# Patient Record
Sex: Female | Born: 1955 | Race: White | Hispanic: No | Smoking: Current every day smoker
Health system: Southern US, Community
[De-identification: ages and names within clinical notes are randomized; demographics above are authoritative.]

## PROBLEM LIST (undated history)

## (undated) DIAGNOSIS — F17200 Nicotine dependence, unspecified, uncomplicated: Secondary | ICD-10-CM

## (undated) DIAGNOSIS — G629 Polyneuropathy, unspecified: Secondary | ICD-10-CM

## (undated) DIAGNOSIS — J9612 Chronic respiratory failure with hypercapnia: Secondary | ICD-10-CM

## (undated) DIAGNOSIS — E785 Hyperlipidemia, unspecified: Secondary | ICD-10-CM

## (undated) DIAGNOSIS — Z9889 Other specified postprocedural states: Secondary | ICD-10-CM

## (undated) DIAGNOSIS — E119 Type 2 diabetes mellitus without complications: Secondary | ICD-10-CM

## (undated) DIAGNOSIS — A419 Sepsis, unspecified organism: Secondary | ICD-10-CM

## (undated) DIAGNOSIS — I2510 Atherosclerotic heart disease of native coronary artery without angina pectoris: Secondary | ICD-10-CM

## (undated) DIAGNOSIS — R079 Chest pain, unspecified: Secondary | ICD-10-CM

## (undated) DIAGNOSIS — I272 Pulmonary hypertension, unspecified: Secondary | ICD-10-CM

## (undated) DIAGNOSIS — R32 Unspecified urinary incontinence: Secondary | ICD-10-CM

## (undated) DIAGNOSIS — G4733 Obstructive sleep apnea (adult) (pediatric): Secondary | ICD-10-CM

## (undated) DIAGNOSIS — Z9981 Dependence on supplemental oxygen: Secondary | ICD-10-CM

## (undated) DIAGNOSIS — I739 Peripheral vascular disease, unspecified: Secondary | ICD-10-CM

## (undated) DIAGNOSIS — I509 Heart failure, unspecified: Secondary | ICD-10-CM

## (undated) DIAGNOSIS — J449 Chronic obstructive pulmonary disease, unspecified: Secondary | ICD-10-CM

## (undated) DIAGNOSIS — M4802 Spinal stenosis, cervical region: Secondary | ICD-10-CM

## (undated) DIAGNOSIS — I5030 Unspecified diastolic (congestive) heart failure: Secondary | ICD-10-CM

## (undated) DIAGNOSIS — F32A Depression, unspecified: Secondary | ICD-10-CM

## (undated) DIAGNOSIS — S81802A Unspecified open wound, left lower leg, initial encounter: Secondary | ICD-10-CM

## (undated) DIAGNOSIS — I700 Atherosclerosis of aorta: Secondary | ICD-10-CM

## (undated) DIAGNOSIS — F419 Anxiety disorder, unspecified: Secondary | ICD-10-CM

## (undated) DIAGNOSIS — R200 Anesthesia of skin: Secondary | ICD-10-CM

## (undated) DIAGNOSIS — G894 Chronic pain syndrome: Secondary | ICD-10-CM

## (undated) DIAGNOSIS — R000 Tachycardia, unspecified: Secondary | ICD-10-CM

## (undated) DIAGNOSIS — M1990 Unspecified osteoarthritis, unspecified site: Secondary | ICD-10-CM

## (undated) DIAGNOSIS — G4730 Sleep apnea, unspecified: Secondary | ICD-10-CM

## (undated) DIAGNOSIS — G959 Disease of spinal cord, unspecified: Secondary | ICD-10-CM

## (undated) DIAGNOSIS — R0600 Dyspnea, unspecified: Secondary | ICD-10-CM

## (undated) DIAGNOSIS — F329 Major depressive disorder, single episode, unspecified: Secondary | ICD-10-CM

## (undated) DIAGNOSIS — Z720 Tobacco use: Secondary | ICD-10-CM

## (undated) DIAGNOSIS — M961 Postlaminectomy syndrome, not elsewhere classified: Secondary | ICD-10-CM

## (undated) DIAGNOSIS — G2581 Restless legs syndrome: Secondary | ICD-10-CM

## (undated) DIAGNOSIS — R7303 Prediabetes: Secondary | ICD-10-CM

## (undated) DIAGNOSIS — I10 Essential (primary) hypertension: Secondary | ICD-10-CM

## (undated) DIAGNOSIS — E039 Hypothyroidism, unspecified: Secondary | ICD-10-CM

## (undated) DIAGNOSIS — J439 Emphysema, unspecified: Secondary | ICD-10-CM

## (undated) DIAGNOSIS — D649 Anemia, unspecified: Secondary | ICD-10-CM

## (undated) DIAGNOSIS — N1832 Chronic kidney disease, stage 3b: Secondary | ICD-10-CM

## (undated) DIAGNOSIS — M47816 Spondylosis without myelopathy or radiculopathy, lumbar region: Secondary | ICD-10-CM

## (undated) DIAGNOSIS — R569 Unspecified convulsions: Secondary | ICD-10-CM

## (undated) DIAGNOSIS — N390 Urinary tract infection, site not specified: Secondary | ICD-10-CM

## (undated) DIAGNOSIS — J9611 Chronic respiratory failure with hypoxia: Secondary | ICD-10-CM

## (undated) DIAGNOSIS — K219 Gastro-esophageal reflux disease without esophagitis: Secondary | ICD-10-CM

## (undated) DIAGNOSIS — M5134 Other intervertebral disc degeneration, thoracic region: Secondary | ICD-10-CM

## (undated) HISTORY — PX: LAMINECTOMY AND MICRODISCECTOMY LUMBAR SPINE: SHX1913

## (undated) HISTORY — DX: Gastro-esophageal reflux disease without esophagitis: K21.9

## (undated) HISTORY — DX: Unspecified convulsions: R56.9

## (undated) HISTORY — DX: Depression, unspecified: F32.A

## (undated) HISTORY — PX: KNEE SURGERY: SHX244

## (undated) HISTORY — PX: OTHER SURGICAL HISTORY: SHX169

## (undated) HISTORY — DX: Major depressive disorder, single episode, unspecified: F32.9

## (undated) HISTORY — DX: Chronic respiratory failure with hypoxia: J96.11

## (undated) HISTORY — PX: CARPAL TUNNEL RELEASE: SHX101

## (undated) HISTORY — DX: Restless legs syndrome: G25.81

## (undated) HISTORY — DX: Unspecified diastolic (congestive) heart failure: I50.30

## (undated) HISTORY — DX: Chronic obstructive pulmonary disease, unspecified: J44.9

## (undated) HISTORY — DX: Chronic respiratory failure with hypercapnia: J96.12

## (undated) HISTORY — DX: Anxiety disorder, unspecified: F41.9

## (undated) HISTORY — DX: Essential (primary) hypertension: I10

## (undated) HISTORY — DX: Hypothyroidism, unspecified: E03.9

---

## 1999-02-16 ENCOUNTER — Encounter: Payer: Self-pay | Admitting: Obstetrics and Gynecology

## 1999-02-16 ENCOUNTER — Ambulatory Visit (HOSPITAL_COMMUNITY): Admission: RE | Admit: 1999-02-16 | Discharge: 1999-02-16 | Payer: Self-pay | Admitting: Obstetrics and Gynecology

## 2000-04-26 ENCOUNTER — Ambulatory Visit (HOSPITAL_COMMUNITY): Admission: RE | Admit: 2000-04-26 | Discharge: 2000-04-26 | Payer: Self-pay | Admitting: Obstetrics and Gynecology

## 2000-04-26 ENCOUNTER — Encounter: Payer: Self-pay | Admitting: Obstetrics and Gynecology

## 2001-05-01 ENCOUNTER — Encounter: Payer: Self-pay | Admitting: Obstetrics and Gynecology

## 2001-05-01 ENCOUNTER — Ambulatory Visit (HOSPITAL_COMMUNITY): Admission: RE | Admit: 2001-05-01 | Discharge: 2001-05-01 | Payer: Self-pay | Admitting: Obstetrics and Gynecology

## 2002-12-17 ENCOUNTER — Encounter: Payer: Self-pay | Admitting: Obstetrics and Gynecology

## 2002-12-17 ENCOUNTER — Ambulatory Visit (HOSPITAL_COMMUNITY): Admission: RE | Admit: 2002-12-17 | Discharge: 2002-12-17 | Payer: Self-pay | Admitting: Obstetrics and Gynecology

## 2004-01-06 ENCOUNTER — Ambulatory Visit (HOSPITAL_COMMUNITY): Admission: RE | Admit: 2004-01-06 | Discharge: 2004-01-06 | Payer: Self-pay | Admitting: Obstetrics and Gynecology

## 2004-10-12 ENCOUNTER — Encounter: Admission: RE | Admit: 2004-10-12 | Discharge: 2004-10-12 | Payer: Self-pay | Admitting: Family Medicine

## 2004-12-16 ENCOUNTER — Ambulatory Visit (HOSPITAL_COMMUNITY): Admission: RE | Admit: 2004-12-16 | Discharge: 2004-12-16 | Payer: Self-pay | Admitting: Orthopedic Surgery

## 2004-12-16 ENCOUNTER — Ambulatory Visit (HOSPITAL_BASED_OUTPATIENT_CLINIC_OR_DEPARTMENT_OTHER): Admission: RE | Admit: 2004-12-16 | Discharge: 2004-12-16 | Payer: Self-pay | Admitting: Orthopedic Surgery

## 2005-02-17 ENCOUNTER — Ambulatory Visit (HOSPITAL_COMMUNITY): Admission: RE | Admit: 2005-02-17 | Discharge: 2005-02-17 | Payer: Self-pay | Admitting: Family Medicine

## 2005-02-26 ENCOUNTER — Encounter: Admission: RE | Admit: 2005-02-26 | Discharge: 2005-02-26 | Payer: Self-pay | Admitting: Family Medicine

## 2005-05-19 ENCOUNTER — Encounter: Admission: RE | Admit: 2005-05-19 | Discharge: 2005-05-19 | Payer: Self-pay | Admitting: Family Medicine

## 2005-09-20 ENCOUNTER — Encounter: Admission: RE | Admit: 2005-09-20 | Discharge: 2005-09-20 | Payer: Self-pay | Admitting: Family Medicine

## 2005-11-24 ENCOUNTER — Encounter: Admission: RE | Admit: 2005-11-24 | Discharge: 2005-11-24 | Payer: Self-pay | Admitting: Family Medicine

## 2006-03-07 ENCOUNTER — Encounter: Admission: RE | Admit: 2006-03-07 | Discharge: 2006-03-07 | Payer: Self-pay | Admitting: Family Medicine

## 2006-04-04 ENCOUNTER — Ambulatory Visit (HOSPITAL_COMMUNITY): Admission: RE | Admit: 2006-04-04 | Discharge: 2006-04-04 | Payer: Self-pay | Admitting: Orthopedic Surgery

## 2006-04-14 ENCOUNTER — Ambulatory Visit (HOSPITAL_BASED_OUTPATIENT_CLINIC_OR_DEPARTMENT_OTHER): Admission: RE | Admit: 2006-04-14 | Discharge: 2006-04-14 | Payer: Self-pay | Admitting: Orthopedic Surgery

## 2006-07-11 ENCOUNTER — Encounter: Admission: RE | Admit: 2006-07-11 | Discharge: 2006-07-11 | Payer: Self-pay | Admitting: Family Medicine

## 2007-02-28 ENCOUNTER — Encounter: Admission: RE | Admit: 2007-02-28 | Discharge: 2007-04-06 | Payer: Self-pay | Admitting: Neurosurgery

## 2007-03-10 ENCOUNTER — Ambulatory Visit (HOSPITAL_COMMUNITY): Admission: RE | Admit: 2007-03-10 | Discharge: 2007-03-10 | Payer: Self-pay | Admitting: Obstetrics and Gynecology

## 2007-03-21 ENCOUNTER — Encounter: Admission: RE | Admit: 2007-03-21 | Discharge: 2007-03-21 | Payer: Self-pay | Admitting: Obstetrics and Gynecology

## 2007-05-11 ENCOUNTER — Encounter
Admission: RE | Admit: 2007-05-11 | Discharge: 2007-08-09 | Payer: Self-pay | Admitting: Physical Medicine & Rehabilitation

## 2007-05-12 ENCOUNTER — Ambulatory Visit: Payer: Self-pay | Admitting: Physical Medicine & Rehabilitation

## 2007-07-24 ENCOUNTER — Ambulatory Visit: Payer: Self-pay | Admitting: Physical Medicine & Rehabilitation

## 2007-08-23 ENCOUNTER — Encounter
Admission: RE | Admit: 2007-08-23 | Discharge: 2007-11-21 | Payer: Self-pay | Admitting: Physical Medicine & Rehabilitation

## 2007-09-07 ENCOUNTER — Ambulatory Visit: Payer: Self-pay | Admitting: Physical Medicine & Rehabilitation

## 2007-10-09 ENCOUNTER — Ambulatory Visit: Payer: Self-pay | Admitting: Physical Medicine & Rehabilitation

## 2007-11-20 ENCOUNTER — Encounter
Admission: RE | Admit: 2007-11-20 | Discharge: 2007-11-20 | Payer: Self-pay | Admitting: Physical Medicine & Rehabilitation

## 2007-12-22 ENCOUNTER — Encounter
Admission: RE | Admit: 2007-12-22 | Discharge: 2008-03-21 | Payer: Self-pay | Admitting: Physical Medicine & Rehabilitation

## 2007-12-22 ENCOUNTER — Ambulatory Visit: Payer: Self-pay | Admitting: Physical Medicine & Rehabilitation

## 2008-02-07 ENCOUNTER — Ambulatory Visit: Payer: Self-pay | Admitting: Physical Medicine & Rehabilitation

## 2008-02-26 ENCOUNTER — Encounter
Admission: RE | Admit: 2008-02-26 | Discharge: 2008-05-26 | Payer: Self-pay | Admitting: Physical Medicine & Rehabilitation

## 2008-04-09 ENCOUNTER — Ambulatory Visit: Payer: Self-pay | Admitting: Physical Medicine & Rehabilitation

## 2008-04-30 ENCOUNTER — Encounter
Admission: RE | Admit: 2008-04-30 | Discharge: 2008-05-14 | Payer: Self-pay | Admitting: Physical Medicine & Rehabilitation

## 2008-05-07 ENCOUNTER — Ambulatory Visit: Payer: Self-pay | Admitting: Physical Medicine & Rehabilitation

## 2008-05-29 ENCOUNTER — Encounter
Admission: RE | Admit: 2008-05-29 | Discharge: 2008-08-27 | Payer: Self-pay | Admitting: Physical Medicine & Rehabilitation

## 2008-06-03 ENCOUNTER — Encounter: Admission: RE | Admit: 2008-06-03 | Discharge: 2008-06-03 | Payer: Self-pay | Admitting: Family Medicine

## 2008-06-10 ENCOUNTER — Ambulatory Visit: Payer: Self-pay | Admitting: Physical Medicine & Rehabilitation

## 2008-06-20 ENCOUNTER — Emergency Department (HOSPITAL_COMMUNITY): Admission: EM | Admit: 2008-06-20 | Discharge: 2008-06-21 | Payer: Self-pay | Admitting: *Deleted

## 2008-07-09 ENCOUNTER — Ambulatory Visit: Payer: Self-pay | Admitting: Physical Medicine & Rehabilitation

## 2008-08-07 ENCOUNTER — Ambulatory Visit: Payer: Self-pay | Admitting: Physical Medicine & Rehabilitation

## 2008-09-02 ENCOUNTER — Encounter
Admission: RE | Admit: 2008-09-02 | Discharge: 2008-12-01 | Payer: Self-pay | Admitting: Physical Medicine & Rehabilitation

## 2008-09-04 ENCOUNTER — Ambulatory Visit: Payer: Self-pay | Admitting: Physical Medicine & Rehabilitation

## 2008-10-03 ENCOUNTER — Ambulatory Visit: Payer: Self-pay | Admitting: Physical Medicine & Rehabilitation

## 2008-11-01 ENCOUNTER — Ambulatory Visit: Payer: Self-pay | Admitting: Physical Medicine & Rehabilitation

## 2008-11-28 ENCOUNTER — Ambulatory Visit: Payer: Self-pay | Admitting: Physical Medicine & Rehabilitation

## 2008-12-16 ENCOUNTER — Ambulatory Visit (HOSPITAL_COMMUNITY)
Admission: RE | Admit: 2008-12-16 | Discharge: 2008-12-16 | Payer: Self-pay | Admitting: Physical Medicine & Rehabilitation

## 2008-12-26 ENCOUNTER — Encounter
Admission: RE | Admit: 2008-12-26 | Discharge: 2009-01-28 | Payer: Self-pay | Admitting: Physical Medicine & Rehabilitation

## 2008-12-31 ENCOUNTER — Ambulatory Visit: Payer: Self-pay | Admitting: Physical Medicine & Rehabilitation

## 2009-01-28 ENCOUNTER — Encounter
Admission: RE | Admit: 2009-01-28 | Discharge: 2009-04-28 | Payer: Self-pay | Admitting: Physical Medicine & Rehabilitation

## 2009-01-30 ENCOUNTER — Ambulatory Visit: Payer: Self-pay | Admitting: Physical Medicine & Rehabilitation

## 2009-02-28 ENCOUNTER — Ambulatory Visit: Payer: Self-pay | Admitting: Physical Medicine & Rehabilitation

## 2009-03-11 ENCOUNTER — Encounter: Admission: RE | Admit: 2009-03-11 | Discharge: 2009-03-11 | Payer: Self-pay | Admitting: Obstetrics and Gynecology

## 2009-03-16 ENCOUNTER — Emergency Department (HOSPITAL_COMMUNITY): Admission: EM | Admit: 2009-03-16 | Discharge: 2009-03-16 | Payer: Self-pay | Admitting: Emergency Medicine

## 2009-03-20 ENCOUNTER — Ambulatory Visit (HOSPITAL_COMMUNITY): Admission: RE | Admit: 2009-03-20 | Discharge: 2009-03-20 | Payer: Self-pay | Admitting: Neurosurgery

## 2009-03-24 ENCOUNTER — Encounter: Admission: RE | Admit: 2009-03-24 | Discharge: 2009-03-24 | Payer: Self-pay | Admitting: Obstetrics and Gynecology

## 2009-04-01 ENCOUNTER — Encounter
Admission: RE | Admit: 2009-04-01 | Discharge: 2009-04-11 | Payer: Self-pay | Admitting: Physical Medicine & Rehabilitation

## 2009-04-03 ENCOUNTER — Ambulatory Visit: Payer: Self-pay | Admitting: Physical Medicine & Rehabilitation

## 2009-04-18 ENCOUNTER — Inpatient Hospital Stay (HOSPITAL_COMMUNITY): Admission: RE | Admit: 2009-04-18 | Discharge: 2009-04-23 | Payer: Self-pay | Admitting: Neurosurgery

## 2009-06-09 ENCOUNTER — Encounter: Admission: RE | Admit: 2009-06-09 | Discharge: 2009-07-17 | Payer: Self-pay | Admitting: Neurosurgery

## 2009-08-15 ENCOUNTER — Encounter
Admission: RE | Admit: 2009-08-15 | Discharge: 2009-11-13 | Payer: Self-pay | Admitting: Physical Medicine & Rehabilitation

## 2009-08-19 ENCOUNTER — Ambulatory Visit: Payer: Self-pay | Admitting: Physical Medicine & Rehabilitation

## 2009-08-20 ENCOUNTER — Emergency Department (HOSPITAL_COMMUNITY): Admission: EM | Admit: 2009-08-20 | Discharge: 2009-08-20 | Payer: Self-pay | Admitting: Emergency Medicine

## 2009-09-17 ENCOUNTER — Ambulatory Visit: Payer: Self-pay | Admitting: Physical Medicine & Rehabilitation

## 2009-10-16 ENCOUNTER — Ambulatory Visit: Payer: Self-pay | Admitting: Physical Medicine & Rehabilitation

## 2009-10-20 ENCOUNTER — Encounter: Admission: RE | Admit: 2009-10-20 | Discharge: 2009-11-26 | Payer: Self-pay | Admitting: Neurosurgery

## 2009-11-13 ENCOUNTER — Encounter
Admission: RE | Admit: 2009-11-13 | Discharge: 2009-12-11 | Payer: Self-pay | Admitting: Physical Medicine & Rehabilitation

## 2009-11-14 ENCOUNTER — Ambulatory Visit: Payer: Self-pay | Admitting: Physical Medicine & Rehabilitation

## 2009-12-15 ENCOUNTER — Encounter
Admission: RE | Admit: 2009-12-15 | Discharge: 2010-03-15 | Payer: Self-pay | Admitting: Physical Medicine & Rehabilitation

## 2009-12-25 ENCOUNTER — Ambulatory Visit: Payer: Self-pay | Admitting: Physical Medicine & Rehabilitation

## 2010-01-21 ENCOUNTER — Ambulatory Visit: Payer: Self-pay | Admitting: Physical Medicine & Rehabilitation

## 2010-02-17 ENCOUNTER — Ambulatory Visit: Payer: Self-pay | Admitting: Physical Medicine & Rehabilitation

## 2010-03-12 ENCOUNTER — Inpatient Hospital Stay (HOSPITAL_COMMUNITY): Admission: EM | Admit: 2010-03-12 | Discharge: 2010-03-14 | Payer: Self-pay | Admitting: Emergency Medicine

## 2010-03-12 ENCOUNTER — Ambulatory Visit: Payer: Self-pay | Admitting: Cardiovascular Disease

## 2010-03-13 ENCOUNTER — Encounter (INDEPENDENT_AMBULATORY_CARE_PROVIDER_SITE_OTHER): Payer: Self-pay | Admitting: Internal Medicine

## 2010-03-16 ENCOUNTER — Encounter
Admission: RE | Admit: 2010-03-16 | Discharge: 2010-06-14 | Payer: Self-pay | Admitting: Physical Medicine & Rehabilitation

## 2010-03-18 ENCOUNTER — Ambulatory Visit: Payer: Self-pay | Admitting: Physical Medicine & Rehabilitation

## 2010-04-14 ENCOUNTER — Inpatient Hospital Stay (HOSPITAL_COMMUNITY): Admission: EM | Admit: 2010-04-14 | Discharge: 2010-04-17 | Payer: Self-pay | Admitting: Emergency Medicine

## 2010-04-22 ENCOUNTER — Ambulatory Visit: Payer: Self-pay | Admitting: Physical Medicine & Rehabilitation

## 2010-05-20 ENCOUNTER — Encounter
Admission: RE | Admit: 2010-05-20 | Discharge: 2010-08-18 | Payer: Self-pay | Admitting: Physical Medicine & Rehabilitation

## 2010-05-26 ENCOUNTER — Ambulatory Visit: Payer: Self-pay | Admitting: Physical Medicine & Rehabilitation

## 2010-07-01 ENCOUNTER — Ambulatory Visit: Payer: Self-pay | Admitting: Physical Medicine & Rehabilitation

## 2010-07-29 ENCOUNTER — Ambulatory Visit: Payer: Self-pay | Admitting: Physical Medicine & Rehabilitation

## 2010-08-20 ENCOUNTER — Encounter
Admission: RE | Admit: 2010-08-20 | Discharge: 2010-11-18 | Payer: Self-pay | Source: Home / Self Care | Admitting: Physical Medicine & Rehabilitation

## 2010-09-01 ENCOUNTER — Ambulatory Visit: Payer: Self-pay | Admitting: Physical Medicine & Rehabilitation

## 2010-10-15 ENCOUNTER — Ambulatory Visit: Payer: Self-pay | Admitting: Physical Medicine & Rehabilitation

## 2010-11-17 ENCOUNTER — Ambulatory Visit: Payer: Self-pay | Admitting: Physical Medicine & Rehabilitation

## 2010-12-14 ENCOUNTER — Encounter
Admission: RE | Admit: 2010-12-14 | Discharge: 2011-01-04 | Payer: Self-pay | Source: Home / Self Care | Attending: Physical Medicine & Rehabilitation | Admitting: Physical Medicine & Rehabilitation

## 2010-12-16 ENCOUNTER — Ambulatory Visit: Admit: 2010-12-16 | Payer: Self-pay | Admitting: Physical Medicine & Rehabilitation

## 2010-12-17 ENCOUNTER — Ambulatory Visit
Admission: RE | Admit: 2010-12-17 | Discharge: 2010-12-17 | Payer: Self-pay | Source: Home / Self Care | Attending: Physical Medicine & Rehabilitation | Admitting: Physical Medicine & Rehabilitation

## 2011-01-02 ENCOUNTER — Encounter: Payer: Self-pay | Admitting: Family Medicine

## 2011-01-03 ENCOUNTER — Encounter: Payer: Self-pay | Admitting: Neurosurgery

## 2011-01-04 ENCOUNTER — Ambulatory Visit
Admission: RE | Admit: 2011-01-04 | Discharge: 2011-01-04 | Payer: Self-pay | Source: Home / Self Care | Attending: Physical Medicine & Rehabilitation | Admitting: Physical Medicine & Rehabilitation

## 2011-02-01 ENCOUNTER — Encounter: Payer: Medicare Other | Admitting: Physical Medicine & Rehabilitation

## 2011-02-01 ENCOUNTER — Encounter: Payer: Medicare Other | Attending: Physical Medicine & Rehabilitation

## 2011-02-01 DIAGNOSIS — M1710 Unilateral primary osteoarthritis, unspecified knee: Secondary | ICD-10-CM

## 2011-02-01 DIAGNOSIS — M25569 Pain in unspecified knee: Secondary | ICD-10-CM | POA: Insufficient documentation

## 2011-02-24 ENCOUNTER — Encounter: Payer: Medicare Other | Attending: Physical Medicine & Rehabilitation

## 2011-02-24 ENCOUNTER — Ambulatory Visit: Payer: Medicare Other

## 2011-02-24 DIAGNOSIS — M25569 Pain in unspecified knee: Secondary | ICD-10-CM | POA: Insufficient documentation

## 2011-02-24 DIAGNOSIS — M1710 Unilateral primary osteoarthritis, unspecified knee: Secondary | ICD-10-CM | POA: Insufficient documentation

## 2011-02-24 DIAGNOSIS — M48061 Spinal stenosis, lumbar region without neurogenic claudication: Secondary | ICD-10-CM

## 2011-02-24 DIAGNOSIS — M549 Dorsalgia, unspecified: Secondary | ICD-10-CM | POA: Insufficient documentation

## 2011-02-24 DIAGNOSIS — M961 Postlaminectomy syndrome, not elsewhere classified: Secondary | ICD-10-CM | POA: Insufficient documentation

## 2011-02-24 DIAGNOSIS — IMO0002 Reserved for concepts with insufficient information to code with codable children: Secondary | ICD-10-CM

## 2011-03-02 LAB — CARDIAC PANEL(CRET KIN+CKTOT+MB+TROPI)
CK, MB: 2.2 ng/mL (ref 0.3–4.0)
CK, MB: 2.5 ng/mL (ref 0.3–4.0)
Total CK: 40 U/L (ref 7–177)
Total CK: 43 U/L (ref 7–177)
Total CK: 83 U/L (ref 7–177)
Troponin I: <0.01 ng/mL (ref 0.00–0.06)

## 2011-03-02 LAB — COMPREHENSIVE METABOLIC PANEL
ALT: 16 U/L (ref 0–35)
AST: 20 U/L (ref 0–37)
Albumin: 1.9 g/dL — ABNORMAL LOW (ref 3.5–5.2)
Alkaline Phosphatase: 18 U/L — ABNORMAL LOW (ref 39–117)
BUN: 14 mg/dL (ref 6–23)
BUN: 15 mg/dL (ref 6–23)
BUN: 6 mg/dL (ref 6–23)
CO2: 29 mEq/L (ref 19–32)
Calcium: 8 mg/dL — ABNORMAL LOW (ref 8.4–10.5)
Calcium: 9 mg/dL (ref 8.4–10.5)
Chloride: 105 mEq/L (ref 96–112)
Creatinine, Ser: 1.08 mg/dL (ref 0.4–1.2)
GFR calc Af Amer: >60 mL/min (ref >60)
GFR calc non Af Amer: 53 mL/min — ABNORMAL LOW (ref >60)
Glucose, Bld: 92 mg/dL (ref 70–99)
Glucose, Bld: 93 mg/dL (ref 70–99)
Potassium: 4.1 mEq/L (ref 3.5–5.1)
Sodium: 136 mEq/L (ref 135–145)
Total Bilirubin: 0.4 mg/dL (ref 0.3–1.2)
Total Protein: 5.8 g/dL — ABNORMAL LOW (ref 6.0–8.3)
Total Protein: 5.8 g/dL — ABNORMAL LOW (ref 6.0–8.3)

## 2011-03-02 LAB — CBC
HCT: 31.8 % — ABNORMAL LOW (ref 36.0–46.0)
HCT: 32.4 % — ABNORMAL LOW (ref 36.0–46.0)
HCT: 34.8 % — ABNORMAL LOW (ref 36.0–46.0)
HCT: 41.4 % (ref 36.0–46.0)
Hemoglobin: 10.8 g/dL — ABNORMAL LOW (ref 12.0–15.0)
Hemoglobin: 11 g/dL — ABNORMAL LOW (ref 12.0–15.0)
Hemoglobin: 11.6 g/dL — ABNORMAL LOW (ref 12.0–15.0)
MCHC: 32.7 g/dL (ref 30.0–36.0)
MCHC: 33.2 g/dL (ref 30.0–36.0)
MCHC: 33.4 g/dL (ref 30.0–36.0)
MCV: 90.7 fL (ref 78.0–100.0)
MCV: 90.8 fL (ref 78.0–100.0)
MCV: 91.4 fL (ref 78.0–100.0)
MCV: 92.1 fL (ref 78.0–100.0)
Platelets: 319 10*3/uL (ref 150–400)
RBC: 3.51 MIL/uL — ABNORMAL LOW (ref 3.87–5.11)
RBC: 4.49 MIL/uL (ref 3.87–5.11)
RDW: 15.3 % (ref 11.5–15.5)
RDW: 15.5 % (ref 11.5–15.5)
RDW: 15.8 % — ABNORMAL HIGH (ref 11.5–15.5)
WBC: 10.9 10*3/uL — ABNORMAL HIGH (ref 4.0–10.5)
WBC: 11.9 10*3/uL — ABNORMAL HIGH (ref 4.0–10.5)

## 2011-03-02 LAB — BASIC METABOLIC PANEL
BUN: 4 mg/dL — ABNORMAL LOW (ref 6–23)
CO2: 24 mEq/L (ref 19–32)
CO2: 26 mEq/L (ref 19–32)
Calcium: 8.3 mg/dL — ABNORMAL LOW (ref 8.4–10.5)
Chloride: 107 mEq/L (ref 96–112)
Chloride: 108 mEq/L (ref 96–112)
Creatinine, Ser: 0.78 mg/dL (ref 0.4–1.2)
GFR calc Af Amer: >60 mL/min (ref >60)
Glucose, Bld: 126 mg/dL — ABNORMAL HIGH (ref 70–99)
Potassium: 3.3 mEq/L — ABNORMAL LOW (ref 3.5–5.1)
Sodium: 138 mEq/L (ref 135–145)

## 2011-03-02 LAB — CLOSTRIDIUM DIFFICILE EIA

## 2011-03-02 LAB — URINALYSIS, ROUTINE W REFLEX MICROSCOPIC
Glucose, UA: NEGATIVE mg/dL
Nitrite: NEGATIVE
Specific Gravity, Urine: 1.016 (ref 1.005–1.030)
pH: 5 (ref 5.0–8.0)

## 2011-03-02 LAB — GLUCOSE, CAPILLARY

## 2011-03-02 LAB — DIFFERENTIAL
Basophils Absolute: 0.1 10*3/uL (ref 0.0–0.1)
Eosinophils Relative: 1 % (ref 0–5)
Lymphocytes Relative: 26 % (ref 12–46)
Lymphs Abs: 3.7 10*3/uL (ref 0.7–4.0)
Neutro Abs: 8.4 10*3/uL — ABNORMAL HIGH (ref 1.7–7.7)

## 2011-03-02 LAB — POCT CARDIAC MARKERS
CKMB, poc: 3.9 ng/mL (ref 1.0–8.0)
Myoglobin, poc: 150 ng/mL (ref 12–200)
Troponin i, poc: <0.05 ng/mL (ref 0.00–0.09)

## 2011-03-02 LAB — RAPID URINE DRUG SCREEN, HOSP PERFORMED
Cocaine: NOT DETECTED
Tetrahydrocannabinol: NOT DETECTED

## 2011-03-02 LAB — CULTURE, BLOOD (ROUTINE X 2)

## 2011-03-02 LAB — URINE CULTURE
Colony Count: NO GROWTH
Culture: NO GROWTH

## 2011-03-02 LAB — STOOL CULTURE

## 2011-03-02 LAB — LIPASE, BLOOD: Lipase: 175 U/L — ABNORMAL HIGH (ref 11–59)

## 2011-03-03 LAB — GLUCOSE, CAPILLARY: Glucose-Capillary: 162 mg/dL — ABNORMAL HIGH (ref 70–99)

## 2011-03-03 LAB — CARDIAC PANEL(CRET KIN+CKTOT+MB+TROPI)
CK, MB: 27 ng/mL (ref 0.3–4.0)
Relative Index: 1 (ref 0.0–2.5)
Relative Index: 1.7 (ref 0.0–2.5)
Relative Index: 2.4 (ref 0.0–2.5)
Total CK: 941 U/L — ABNORMAL HIGH (ref 7–177)
Total CK: 998 U/L — ABNORMAL HIGH (ref 7–177)
Troponin I: 0.01 ng/mL (ref 0.00–0.06)
Troponin I: <0.01 ng/mL (ref 0.00–0.06)

## 2011-03-03 LAB — BASIC METABOLIC PANEL
CO2: 28 mEq/L (ref 19–32)
Chloride: 103 mEq/L (ref 96–112)
Creatinine, Ser: 0.99 mg/dL (ref 0.4–1.2)
GFR calc Af Amer: >60 mL/min (ref >60)
Glucose, Bld: 90 mg/dL (ref 70–99)
Sodium: 137 mEq/L (ref 135–145)

## 2011-03-03 LAB — COMPREHENSIVE METABOLIC PANEL
ALT: 29 U/L (ref 0–35)
AST: 57 U/L — ABNORMAL HIGH (ref 0–37)
Alkaline Phosphatase: 19 U/L — ABNORMAL LOW (ref 39–117)
CO2: 28 mEq/L (ref 19–32)
Chloride: 102 mEq/L (ref 96–112)
GFR calc Af Amer: 47 mL/min — ABNORMAL LOW (ref >60)
GFR calc non Af Amer: 39 mL/min — ABNORMAL LOW (ref >60)
Glucose, Bld: 117 mg/dL — ABNORMAL HIGH (ref 70–99)
Potassium: 3.7 mEq/L (ref 3.5–5.1)
Sodium: 136 mEq/L (ref 135–145)
Total Bilirubin: 0.4 mg/dL (ref 0.3–1.2)

## 2011-03-03 LAB — HEMOGLOBIN A1C: Hgb A1c MFr Bld: 5.8 % (ref 4.6–6.1)

## 2011-03-03 LAB — CBC
Hemoglobin: 12.3 g/dL (ref 12.0–15.0)
Hemoglobin: 12.3 g/dL (ref 12.0–15.0)
MCHC: 34.4 g/dL (ref 30.0–36.0)
MCHC: 34.8 g/dL (ref 30.0–36.0)
MCV: 94.2 fL (ref 78.0–100.0)
RBC: 3.75 MIL/uL — ABNORMAL LOW (ref 3.87–5.11)
RBC: 3.8 MIL/uL — ABNORMAL LOW (ref 3.87–5.11)
WBC: 7.8 10*3/uL (ref 4.0–10.5)

## 2011-03-03 LAB — LIPID PANEL
HDL: 24 mg/dL — ABNORMAL LOW (ref >39)
Total CHOL/HDL Ratio: 4.1 RATIO
VLDL: 13 mg/dL (ref 0–40)

## 2011-03-03 LAB — MRSA PCR SCREENING: MRSA by PCR: NEGATIVE

## 2011-03-07 LAB — URINE MICROSCOPIC-ADD ON

## 2011-03-07 LAB — COMPREHENSIVE METABOLIC PANEL
BUN: 34 mg/dL — ABNORMAL HIGH (ref 6–23)
CO2: 30 mEq/L (ref 19–32)
Calcium: 8.4 mg/dL (ref 8.4–10.5)
Creatinine, Ser: 1.58 mg/dL — ABNORMAL HIGH (ref 0.4–1.2)
GFR calc Af Amer: 42 mL/min — ABNORMAL LOW (ref >60)
GFR calc non Af Amer: 34 mL/min — ABNORMAL LOW (ref >60)
Glucose, Bld: 108 mg/dL — ABNORMAL HIGH (ref 70–99)

## 2011-03-07 LAB — RAPID URINE DRUG SCREEN, HOSP PERFORMED
Amphetamines: NOT DETECTED
Benzodiazepines: POSITIVE — AB
Tetrahydrocannabinol: NOT DETECTED

## 2011-03-07 LAB — POCT I-STAT, CHEM 8
BUN: 47 mg/dL — ABNORMAL HIGH (ref 6–23)
Chloride: 99 mEq/L (ref 96–112)
Sodium: 134 mEq/L — ABNORMAL LOW (ref 135–145)
TCO2: 32 mmol/L (ref 0–100)

## 2011-03-07 LAB — DIFFERENTIAL
Basophils Absolute: 0.1 10*3/uL (ref 0.0–0.1)
Lymphocytes Relative: 26 % (ref 12–46)
Neutro Abs: 5.4 10*3/uL (ref 1.7–7.7)
Neutrophils Relative %: 64 % (ref 43–77)

## 2011-03-07 LAB — URINALYSIS, ROUTINE W REFLEX MICROSCOPIC
Glucose, UA: NEGATIVE mg/dL
Protein, ur: NEGATIVE mg/dL
pH: 5 (ref 5.0–8.0)

## 2011-03-07 LAB — CK TOTAL AND CKMB (NOT AT ARMC): Total CK: 1380 U/L — ABNORMAL HIGH (ref 7–177)

## 2011-03-07 LAB — SALICYLATE LEVEL: Salicylate Lvl: <4 mg/dL (ref 2.8–20.0)

## 2011-03-07 LAB — CBC
Platelets: 200 10*3/uL (ref 150–400)
RDW: 14.5 % (ref 11.5–15.5)

## 2011-03-07 LAB — ACETAMINOPHEN LEVEL: Acetaminophen (Tylenol), Serum: <10 ug/mL — ABNORMAL LOW (ref 10–30)

## 2011-03-07 LAB — BRAIN NATRIURETIC PEPTIDE: Pro B Natriuretic peptide (BNP): 144 pg/mL — ABNORMAL HIGH (ref 0.0–100.0)

## 2011-03-08 ENCOUNTER — Encounter (HOSPITAL_COMMUNITY): Payer: Medicare Other

## 2011-03-09 ENCOUNTER — Encounter (HOSPITAL_COMMUNITY): Payer: Medicare Other | Admitting: Radiology

## 2011-03-12 ENCOUNTER — Inpatient Hospital Stay (HOSPITAL_COMMUNITY)
Admission: EM | Admit: 2011-03-12 | Discharge: 2011-03-18 | DRG: 304 | Disposition: A | Discharge status: Home Health | Payer: Medicare Other | Attending: Pulmonary Disease | Admitting: Pulmonary Disease

## 2011-03-12 ENCOUNTER — Emergency Department (HOSPITAL_COMMUNITY): Payer: Medicare Other

## 2011-03-12 DIAGNOSIS — G8929 Other chronic pain: Secondary | ICD-10-CM | POA: Diagnosis present

## 2011-03-12 DIAGNOSIS — E876 Hypokalemia: Secondary | ICD-10-CM | POA: Diagnosis present

## 2011-03-12 DIAGNOSIS — E119 Type 2 diabetes mellitus without complications: Secondary | ICD-10-CM | POA: Diagnosis present

## 2011-03-12 DIAGNOSIS — N179 Acute kidney failure, unspecified: Secondary | ICD-10-CM | POA: Diagnosis present

## 2011-03-12 DIAGNOSIS — N390 Urinary tract infection, site not specified: Secondary | ICD-10-CM | POA: Diagnosis present

## 2011-03-12 DIAGNOSIS — I674 Hypertensive encephalopathy: Secondary | ICD-10-CM | POA: Diagnosis present

## 2011-03-12 DIAGNOSIS — I10 Essential (primary) hypertension: Secondary | ICD-10-CM

## 2011-03-12 DIAGNOSIS — F17200 Nicotine dependence, unspecified, uncomplicated: Secondary | ICD-10-CM | POA: Diagnosis present

## 2011-03-12 DIAGNOSIS — B9689 Other specified bacterial agents as the cause of diseases classified elsewhere: Secondary | ICD-10-CM | POA: Diagnosis present

## 2011-03-12 DIAGNOSIS — W06XXXA Fall from bed, initial encounter: Secondary | ICD-10-CM | POA: Diagnosis present

## 2011-03-12 DIAGNOSIS — I498 Other specified cardiac arrhythmias: Secondary | ICD-10-CM | POA: Diagnosis present

## 2011-03-12 DIAGNOSIS — Y92009 Unspecified place in unspecified non-institutional (private) residence as the place of occurrence of the external cause: Secondary | ICD-10-CM

## 2011-03-12 DIAGNOSIS — R5381 Other malaise: Secondary | ICD-10-CM | POA: Diagnosis present

## 2011-03-12 DIAGNOSIS — Y998 Other external cause status: Secondary | ICD-10-CM

## 2011-03-12 DIAGNOSIS — R569 Unspecified convulsions: Secondary | ICD-10-CM | POA: Diagnosis present

## 2011-03-12 DIAGNOSIS — R51 Headache: Secondary | ICD-10-CM | POA: Diagnosis present

## 2011-03-12 DIAGNOSIS — J9600 Acute respiratory failure, unspecified whether with hypoxia or hypercapnia: Secondary | ICD-10-CM | POA: Diagnosis present

## 2011-03-12 LAB — POCT I-STAT, CHEM 8
HCT: 54 % — ABNORMAL HIGH (ref 36.0–46.0)
Hemoglobin: 18.4 g/dL — ABNORMAL HIGH (ref 12.0–15.0)
Potassium: 3.2 mEq/L — ABNORMAL LOW (ref 3.5–5.1)
Sodium: 142 mEq/L (ref 135–145)

## 2011-03-13 LAB — CARDIAC PANEL(CRET KIN+CKTOT+MB+TROPI)
CK, MB: 1.2 ng/mL (ref 0.3–4.0)
CK, MB: 5.8 ng/mL — ABNORMAL HIGH (ref 0.3–4.0)
CK, MB: 5.5 ng/mL — ABNORMAL HIGH (ref 0.3–4.0)
Relative Index: INVALID (ref 0.0–2.5)
Total CK: 381 U/L — ABNORMAL HIGH (ref 7–177)
Troponin I: 0.02 ng/mL (ref 0.00–0.06)

## 2011-03-13 LAB — BASIC METABOLIC PANEL
Chloride: 100 mEq/L (ref 96–112)
Creatinine, Ser: 1.2 mg/dL (ref 0.4–1.2)
GFR calc Af Amer: 57 mL/min — ABNORMAL LOW (ref >60)
GFR calc non Af Amer: 47 mL/min — ABNORMAL LOW (ref >60)
Potassium: 3.3 mEq/L — ABNORMAL LOW (ref 3.5–5.1)

## 2011-03-13 LAB — MRSA PCR SCREENING: MRSA by PCR: NEGATIVE

## 2011-03-13 LAB — CBC
Hemoglobin: 16.9 g/dL — ABNORMAL HIGH (ref 12.0–15.0)
Platelets: 208 10*3/uL (ref 150–400)
RBC: 5.5 MIL/uL — ABNORMAL HIGH (ref 3.87–5.11)

## 2011-03-13 LAB — GLUCOSE, CAPILLARY

## 2011-03-14 DIAGNOSIS — R4182 Altered mental status, unspecified: Secondary | ICD-10-CM

## 2011-03-14 DIAGNOSIS — G9340 Encephalopathy, unspecified: Secondary | ICD-10-CM

## 2011-03-14 DIAGNOSIS — I10 Essential (primary) hypertension: Secondary | ICD-10-CM

## 2011-03-14 LAB — CBC
MCV: 90.4 fL (ref 78.0–100.0)
Platelets: 212 10*3/uL (ref 150–400)
RBC: 5.39 MIL/uL — ABNORMAL HIGH (ref 3.87–5.11)
WBC: 10.8 10*3/uL — ABNORMAL HIGH (ref 4.0–10.5)

## 2011-03-14 LAB — GLUCOSE, CAPILLARY
Glucose-Capillary: 89 mg/dL (ref 70–99)
Glucose-Capillary: 119 mg/dL — ABNORMAL HIGH (ref 70–99)
Glucose-Capillary: 104 mg/dL — ABNORMAL HIGH (ref 70–99)

## 2011-03-14 LAB — BASIC METABOLIC PANEL
GFR calc non Af Amer: >60 mL/min (ref >60)
Glucose, Bld: 84 mg/dL (ref 70–99)
Potassium: 2.9 mEq/L — ABNORMAL LOW (ref 3.5–5.1)
Sodium: 137 mEq/L (ref 135–145)

## 2011-03-14 LAB — MAGNESIUM: Magnesium: 2 mg/dL (ref 1.5–2.5)

## 2011-03-15 LAB — BASIC METABOLIC PANEL
BUN: 11 mg/dL (ref 6–23)
CO2: 30 mEq/L (ref 19–32)
Chloride: 95 mEq/L — ABNORMAL LOW (ref 96–112)
Creatinine, Ser: 0.83 mg/dL (ref 0.4–1.2)

## 2011-03-15 LAB — GLUCOSE, CAPILLARY: Glucose-Capillary: 109 mg/dL — ABNORMAL HIGH (ref 70–99)

## 2011-03-15 LAB — CBC
HCT: 49.5 % — ABNORMAL HIGH (ref 36.0–46.0)
Hemoglobin: 17 g/dL — ABNORMAL HIGH (ref 12.0–15.0)
MCH: 31.1 pg (ref 26.0–34.0)
MCV: 90.7 fL (ref 78.0–100.0)
RBC: 5.46 MIL/uL — ABNORMAL HIGH (ref 3.87–5.11)

## 2011-03-16 ENCOUNTER — Inpatient Hospital Stay (HOSPITAL_COMMUNITY): Payer: Medicare Other

## 2011-03-16 LAB — URINALYSIS, ROUTINE W REFLEX MICROSCOPIC
Glucose, UA: NEGATIVE mg/dL
Ketones, ur: NEGATIVE mg/dL
Protein, ur: 100 mg/dL — AB
pH: 7.5 (ref 5.0–8.0)

## 2011-03-16 LAB — CBC
Hemoglobin: 17.3 g/dL — ABNORMAL HIGH (ref 12.0–15.0)
MCH: 31.6 pg (ref 26.0–34.0)
MCHC: 35.1 g/dL (ref 30.0–36.0)
MCV: 90.1 fL (ref 78.0–100.0)
Platelets: 226 10*3/uL (ref 150–400)

## 2011-03-16 LAB — BASIC METABOLIC PANEL
BUN: 14 mg/dL (ref 6–23)
CO2: 28 mEq/L (ref 19–32)
Calcium: 9.7 mg/dL (ref 8.4–10.5)
Creatinine, Ser: 0.84 mg/dL (ref 0.4–1.2)
GFR calc Af Amer: >60 mL/min (ref >60)

## 2011-03-16 LAB — URINE MICROSCOPIC-ADD ON

## 2011-03-16 LAB — GLUCOSE, CAPILLARY
Glucose-Capillary: 97 mg/dL (ref 70–99)
Glucose-Capillary: 106 mg/dL — ABNORMAL HIGH (ref 70–99)
Glucose-Capillary: 112 mg/dL — ABNORMAL HIGH (ref 70–99)

## 2011-03-17 DIAGNOSIS — N390 Urinary tract infection, site not specified: Secondary | ICD-10-CM

## 2011-03-17 LAB — GLUCOSE, CAPILLARY
Glucose-Capillary: 145 mg/dL — ABNORMAL HIGH (ref 70–99)
Glucose-Capillary: 93 mg/dL (ref 70–99)
Glucose-Capillary: 127 mg/dL — ABNORMAL HIGH (ref 70–99)

## 2011-03-17 NOTE — Procedures (Signed)
EEG NUMBER:  HISTORY:  A 55 year old female with seizure related to hypertensive crisis.  MEDICATIONS:  Heparin, Benicar, hydrochlorothiazide, Apresoline, Lyrica, NovoLog.  CONDITIONS OF RECORDING:  This is a 16-channel EEG carried out with the patient in the awake and drowsy state.  The patient does not achieve alpha throughout the entire tracing.  The tracing is predominated by poorly organized beta rhythms and the 5-6 Hz theta with an underlying polymorphic delta activity that further slows the rhythm.  Also noted is sharp activity that is seen on occasion over the left hemisphere with phase reversal at T5.  The patient does not achieve stage II sleep. Hypoventilation and intermittent photic stimulation were performed. Both showed no change in the tracing.  There was only a mild build up with hyperventilation.  IMPRESSION:  This is an abnormal EEG secondary to epileptiform activity over the left temporal region.  This finding is consistent with the patient's history of seizure.          ______________________________ Thana Farr, MD    [id number] D:  03/16/2011 17:25:58  T:  03/17/2011 02:45:29  Job #:  [id number]

## 2011-03-18 ENCOUNTER — Encounter: Payer: Self-pay | Admitting: Adult Health

## 2011-03-18 DIAGNOSIS — G40309 Generalized idiopathic epilepsy and epileptic syndromes, not intractable, without status epilepticus: Secondary | ICD-10-CM

## 2011-03-18 DIAGNOSIS — N390 Urinary tract infection, site not specified: Secondary | ICD-10-CM

## 2011-03-18 DIAGNOSIS — I10 Essential (primary) hypertension: Secondary | ICD-10-CM

## 2011-03-18 LAB — BASIC METABOLIC PANEL
BUN: 23 mg/dL (ref 6–23)
Chloride: 96 mEq/L (ref 96–112)
Creatinine, Ser: 1.23 mg/dL — ABNORMAL HIGH (ref 0.4–1.2)
GFR calc Af Amer: 55 mL/min — ABNORMAL LOW (ref >60)
GFR calc non Af Amer: 46 mL/min — ABNORMAL LOW (ref >60)
Potassium: 4 mEq/L (ref 3.5–5.1)

## 2011-03-18 LAB — GLUCOSE, CAPILLARY: Glucose-Capillary: 112 mg/dL — ABNORMAL HIGH (ref 70–99)

## 2011-03-18 LAB — URINE CULTURE

## 2011-03-22 ENCOUNTER — Ambulatory Visit (INDEPENDENT_AMBULATORY_CARE_PROVIDER_SITE_OTHER): Payer: Medicare Other | Admitting: Adult Health

## 2011-03-22 ENCOUNTER — Inpatient Hospital Stay (HOSPITAL_COMMUNITY)
Admission: AD | Admit: 2011-03-22 | Discharge: 2011-03-25 | DRG: 603 | Disposition: A | Discharge status: Home / Self Care | Payer: Medicare Other | Source: Ambulatory Visit | Attending: Internal Medicine | Admitting: Internal Medicine

## 2011-03-22 ENCOUNTER — Encounter: Payer: Self-pay | Admitting: Adult Health

## 2011-03-22 ENCOUNTER — Inpatient Hospital Stay (HOSPITAL_COMMUNITY): Payer: Medicare Other

## 2011-03-22 DIAGNOSIS — Y92009 Unspecified place in unspecified non-institutional (private) residence as the place of occurrence of the external cause: Secondary | ICD-10-CM

## 2011-03-22 DIAGNOSIS — Z880 Allergy status to penicillin: Secondary | ICD-10-CM

## 2011-03-22 DIAGNOSIS — L03119 Cellulitis of unspecified part of limb: Principal | ICD-10-CM | POA: Diagnosis present

## 2011-03-22 DIAGNOSIS — W64XXXA Exposure to other animate mechanical forces, initial encounter: Secondary | ICD-10-CM | POA: Diagnosis present

## 2011-03-22 DIAGNOSIS — IMO0002 Reserved for concepts with insufficient information to code with codable children: Secondary | ICD-10-CM | POA: Diagnosis present

## 2011-03-22 DIAGNOSIS — I959 Hypotension, unspecified: Secondary | ICD-10-CM

## 2011-03-22 DIAGNOSIS — L02419 Cutaneous abscess of limb, unspecified: Principal | ICD-10-CM | POA: Diagnosis present

## 2011-03-22 LAB — CBC
HCT: 49.3 % — ABNORMAL HIGH (ref 36.0–46.0)
Hemoglobin: 16.6 g/dL — ABNORMAL HIGH (ref 12.0–15.0)
MCH: 31.4 pg (ref 26.0–34.0)
MCV: 93.4 fL (ref 78.0–100.0)
RBC: 5.28 MIL/uL — ABNORMAL HIGH (ref 3.87–5.11)

## 2011-03-22 LAB — COMPREHENSIVE METABOLIC PANEL
AST: 29 U/L (ref 0–37)
Albumin: 3.9 g/dL (ref 3.5–5.2)
Albumin: 3.5 g/dL (ref 3.5–5.2)
Alkaline Phosphatase: 20 U/L — ABNORMAL LOW (ref 39–117)
Alkaline Phosphatase: 15 U/L — ABNORMAL LOW (ref 39–117)
CO2: 28 mEq/L (ref 19–32)
Chloride: 94 mEq/L — ABNORMAL LOW (ref 96–112)
Creatinine, Ser: 2.66 mg/dL — ABNORMAL HIGH (ref 0.4–1.2)
GFR calc Af Amer: 26 mL/min — ABNORMAL LOW (ref >60)
GFR calc non Af Amer: 19 mL/min — ABNORMAL LOW (ref >60)
GFR calc non Af Amer: 21 mL/min — ABNORMAL LOW (ref >60)
Potassium: 5.3 mEq/L — ABNORMAL HIGH (ref 3.5–5.1)
Total Bilirubin: 0.8 mg/dL (ref 0.3–1.2)

## 2011-03-22 LAB — URINALYSIS, ROUTINE W REFLEX MICROSCOPIC
Bilirubin Urine: NEGATIVE
Glucose, UA: NEGATIVE mg/dL
Hgb urine dipstick: NEGATIVE
Specific Gravity, Urine: 1.019 (ref 1.005–1.030)

## 2011-03-22 LAB — DIFFERENTIAL
Lymphocytes Relative: 39 % (ref 12–46)
Lymphs Abs: 3.6 10*3/uL (ref 0.7–4.0)
Monocytes Absolute: 0.9 10*3/uL (ref 0.1–1.0)
Monocytes Relative: 10 % (ref 3–12)
Neutro Abs: 4.4 10*3/uL (ref 1.7–7.7)
Neutrophils Relative %: 48 % (ref 43–77)

## 2011-03-22 LAB — PROCALCITONIN: Procalcitonin: <0.1 ng/mL

## 2011-03-22 LAB — CARDIAC PANEL(CRET KIN+CKTOT+MB+TROPI): Relative Index: 3.3 — ABNORMAL HIGH (ref 0.0–2.5)

## 2011-03-22 LAB — LACTIC ACID, PLASMA: Lactic Acid, Venous: 0.9 mmol/L (ref 0.5–2.2)

## 2011-03-22 LAB — D-DIMER, QUANTITATIVE: D-Dimer, Quant: 0.78 ug/mL-FEU — ABNORMAL HIGH (ref 0.00–0.48)

## 2011-03-22 NOTE — H&P (Signed)
03/22/2011  55 yo WF presents today for a post hospital follow up. She was admitted 3/30-03/18/11 for Hypertensive crisis:        82, 2011, with acute hypertensive crisis in the setting of abrupt cessation of antihypertensive regimen as well as normal pain control regimen.  She apparently had run out of her chronic pain edications prior to admission and also had stopped taking her ntihypertensive.   During hospital course, antihypertensive  control was achieved with IV medications.  At the time of discharge, she will be maintained on Benicar, hydralazine, and  hydrochlorothiazide.    She did suffer seizure during hospital course. Felt secondary to  hypertensive crisis.  Neurology was consulted and placed on Keppra. She also had a   Morganella UTI: Discharged on Cipro.  She did has some transient respiratory failure that resolved with decreased benzodiazepine use At discharge no desaturations.     On arrival to office today her blood pressure is systolic of 66/40 and O2 sat at 79% on room air. She was placed on O2 at 2l/m with sat at 96%. Since discharge she says she has been weak , no energy. She is taking all her meds and is back on her pain meds.  She denies cough or congestion, fever or leg swelling. No seizure activity since discharge.   She will require admission for evaluation and treatment.     ROS:  Constitutional:   No  weight loss, night sweats,  Fevers, chills   HEENT:   No headaches,  Difficulty swallowing,  Tooth/dental problems, or  Sore throat,                No sneezing, itching, ear ache, nasal congestion, post nasal drip,   CV:  No chest pain,  Orthopnea, PND, swelling in lower extremities, anasarca, dizziness, palpitations, syncope.   GI  No heartburn, indigestion, abdominal pain,  vomiting, diarrhea, change in bowel habits, loss of appetite, bloody stools.   Resp:   No excess mucus, no productive cough,  No non-productive cough,  No coughing up of blood.  No change in color of  mucus.  No wheezing.  No chest wall deformity  Skin: no rash or lesions.  GU: no dysuria, change in color of urine, no urgency or frequency.  No flank pain, no hematuria   MS:  No joint pain or swelling.  No decreased range of motion.     Psych:  No change in mood or affect. No depression or anxiety.  No memory loss.       Past Medical History  Diagnosis Date  . Hypertension   . Diabetes mellitus   . Dyslipidemia   . Anxiety   . Depression    Seizures 03/12/2011 - started on Keppra by Neuro during hospital admit for hypertensive crisis.    No family history on file.   History   Social History  . Marital Status: Married    Spouse Name: N/A    Number of Children: N/A  . Years of Education: N/A   Occupational History  . Not on file.   Social History Main Topics  . Smoking status: Current Everyday Smoker    Types: Cigarettes  . Smokeless tobacco: Not on file  . Alcohol Use: No  . Drug Use: No  . Sexually Active: Not on file   Other Topics Concern  . Not on file   Social History Narrative  . No narrative on file     No Known Allergies  Outpatient Prescriptions Prior to Visit  Medication Sig Dispense Refill  . ALPRAZolam (XANAX) 0.5 MG tablet Take 0.5 mg by mouth 3 (three) times daily as needed.        . carvedilol (COREG) 12.5 MG tablet Take 12.5 mg by mouth 2 (two) times daily with a meal.        . celecoxib (CELEBREX) 200 MG capsule Take 200 mg by mouth 2 (two) times daily with a meal.        . citalopram (CELEXA) 20 MG tablet 2 tabs by mouth once daily       . esomeprazole (NEXIUM) 40 MG capsule Take 40 mg by mouth daily before breakfast.        . fenofibrate micronized (LOFIBRA) 134 MG capsule Take 134 mg by mouth daily before breakfast.        . hydrALAZINE (APRESOLINE) 10 MG tablet Take 10 mg by mouth every 6 (six) hours.        . levETIRAcetam (KEPPRA) 250 MG tablet Take 250 mg by mouth 2 (two) times daily.        Marland Kitchen olmesartan-hydrochlorothiazide  (BENICAR HCT) 40-25 MG per tablet Take 1 tablet by mouth daily.        . pregabalin (LYRICA) 150 MG capsule Take 150 mg by mouth 2 (two) times daily.         O:  VS : 97.4 , 66/44, 61, 79% room air ( 96%-2l/m )   GEN: A/Ox3; pleasant , NAD, chronically ill appearing in wheelchair.   HEENT:  [place]/AT,  EACs-clear, TMs-wnl, NOSE-clear, THROAT-clear, no lesions, no postnasal drip or exudate noted.   NECK:  Supple w/ fair ROM; no JVD; normal carotid impulses w/o bruits; no thyromegaly or nodules palpated; no lymphadenopathy.  RESP  Clear  P & A; w/o, wheezes/ rales/ or rhonchi.no accessory muscle use, no dullness to percussion  CARD:  RRR, no m/r/g  , no peripheral edema, pulses intact, no cyanosis or clubbing.  GI:   Soft & nt; nml bowel sounds; no organomegaly or masses detected.  Musco: Warm bil, no deformities or joint swelling noted.   Neuro: alert, no focal deficits noted.    Skin: Warm, no lesions or rashes    IMPRESSION/PLAN:   1. Hypotension Suspect over suppression of blood pressure. Now that she is taking all her meds could be overcompensating causing hypotension.  Will Admit , give IV saline bolus of 500cc.  Hold b/p meds at this time. Restart slowly and monitor b/p closely.  Will check labs with cardiac enzymes, PCT, CBC  and lactic acid to rule out other causes.   2. Hypoxia ? Etiology. She is an active smoker.  Place on O2 . To keep sat >90%.  Check xray . And D.Dimer. ? If positive consider CT if not improving to rule out PE.   3. UTI- recent morganella UTI- now finished abx, recheck urine cx.   4. Renal failure- recent rise in scr during hospital admit. - will follow scr closely.   5. Seizure- recent seizure during hospital stay with hypertensive crisis- cont Keppra.

## 2011-03-22 NOTE — Progress Notes (Signed)
  Subjective:    Patient ID: [name], female    DOB: [birth year], 55 y.o.   MRN: [id number]  HPI Comments: 55 yo female with known hx of HTN presents today for post hospital visit.  She is hypotensive and hypoxic on arrival. She require re-admission to hospital  See H/P admit note      Review of Systems     Objective:   Physical Exam        Assessment & Plan:

## 2011-03-23 DIAGNOSIS — R197 Diarrhea, unspecified: Secondary | ICD-10-CM

## 2011-03-23 DIAGNOSIS — R0902 Hypoxemia: Secondary | ICD-10-CM

## 2011-03-23 DIAGNOSIS — I959 Hypotension, unspecified: Secondary | ICD-10-CM

## 2011-03-23 LAB — CBC
HCT: 43.3 % (ref 36.0–46.0)
MCH: 30.4 pg (ref 26.0–34.0)
MCHC: 32.7 g/dL (ref 30.0–36.0)
MCV: 93.2 fL (ref 78.0–100.0)
MCV: 94.4 fL (ref 78.0–100.0)
Platelets: 205 10*3/uL (ref 150–400)
RBC: 4.59 MIL/uL (ref 3.87–5.11)
RDW: 13.6 % (ref 11.5–15.5)
WBC: 9.3 10*3/uL (ref 4.0–10.5)

## 2011-03-23 LAB — GLUCOSE, CAPILLARY
Glucose-Capillary: 100 mg/dL — ABNORMAL HIGH (ref 70–99)
Glucose-Capillary: 119 mg/dL — ABNORMAL HIGH (ref 70–99)
Glucose-Capillary: 133 mg/dL — ABNORMAL HIGH (ref 70–99)
Glucose-Capillary: 87 mg/dL (ref 70–99)

## 2011-03-23 LAB — BASIC METABOLIC PANEL
BUN: 33 mg/dL — ABNORMAL HIGH (ref 6–23)
Chloride: 102 mEq/L (ref 96–112)
Creatinine, Ser: 1.83 mg/dL — ABNORMAL HIGH (ref 0.4–1.2)
Creatinine, Ser: 0.84 mg/dL (ref 0.4–1.2)
GFR calc Af Amer: >60 mL/min (ref >60)
GFR calc non Af Amer: >60 mL/min (ref >60)
Potassium: 4.4 mEq/L (ref 3.5–5.1)

## 2011-03-23 LAB — TYPE AND SCREEN

## 2011-03-23 LAB — BRAIN NATRIURETIC PEPTIDE: Pro B Natriuretic peptide (BNP): 86.8 pg/mL (ref 0.0–100.0)

## 2011-03-23 LAB — ABO/RH: ABO/RH(D): A POS

## 2011-03-23 NOTE — Discharge Summary (Addendum)
[name], [name]                ACCOUNT NO.:  [id number]  MEDICAL RECORD NO.:  [id number]           PATIENT TYPE:  I  LOCATION:  3310                         FACILITY:  [facility]  PHYSICIAN:  Kalman Shan, MD   DATE OF BIRTH:  03/29/1956  DATE OF ADMISSION:  03/12/2011 DATE OF DISCHARGE:  03/18/2011                              DISCHARGE SUMMARY   CONSULTING MEDICAL DOCTOR:  Thana Farr, MD, [facility].  DISCHARGE DIAGNOSES: 1. Hypertensive crisis. 2. Seizures secondary to hypertensive crisis. 3. Morganella urinary tract infection. 4. Hyperglycemia. 5. Acute renal failure. 6. Acute respiratory failure.  MICRO DATA:  On March 16, 2011, urine culture demonstrates greater than 100,000 colonies of Morganella resistant to ampicillin, cefazolin, and nitrofurantoin, otherwise sensitive to ceftriaxone, ciprofloxacin, gentamicin, Levaquin, tobramycin, and Bactrim.  MRSA/PCR screening on March 12, 2011, was negative. LABORATORY DATA:  On March 18, 2011, BMP demonstrates sodium 135, potassium 4.0, chloride 96, CO2 of 30, glucose 95, BUN 23, creatinine 1.23, and calcium 9.5.  On March 16, 2011, BMP demonstrates a creatinine of 0.84.  In this practice, the patient was placed on subcutaneous heparin for DVT prophylaxis.  KEY EVENTS/STUDIES: 1. On March 12, 2011, the patient experienced a seizure.  CT of the     head demonstrated PRES. 2. On March 16, 2011, CT of the head demonstrates findings consistent     with stable hypertensive encephalopathy.  No interval hemorrhage. 3. On March 16, 2011, EEG demonstrates an abnormal EEG secondary to     epileptiform activity over the left temporal region.  This is     consistent with the patient's history of seizure.  HISTORY OF PRESENT ILLNESS:  [name] is a 55 year old white female with a past medical history of hypertension, diabetes, dyslipidemia, anxiety, and depression who presented on March 12, 2011, with hypertensive  crisis.  She also has a known history of chronic pain which is managed by [facility].  At the time of admission, she apparently had been out of her pain medications for a few weeks and had not been taking any of her antihypertensive medications either.  She presented to the emergency department with a significant headache on the 30th and was found to be markedly hypertensive and an urgent CT scan of the head demonstrated PRES findings.  An MRI was scheduled, but was unable to be completed secondary to blood pressure spike into the 210 systolic and she unfortunately had a seizure.  She received Ativan and labetalol and her seizures abated.  Her blood pressure improved to 180 systolic, but her headache persisted.  She was admitted to the Intensive Care Unit secondary to seizure activity and the risk for further seizures with significantly elevated blood pressures.  She was maintained in the Intensive Care Unit for close blood pressure monitoring.  She was placed on antihypertensive regimen and maintained on 2-4 liters per nasal cannula.  She is placed on seizure precautions and also was given Ativan initially for seizures.  Secondary to sedation, she did have mild respiratory depression and did not require intubation.  Her blood pressure medication regimen was resumed.  During  hospital course, she was started on Benicar and hydralazine.  This did achieve blood pressure control.  She also was placed on hydrochlorothiazide.  She was significantly bradycardic during hospitalization and beta-blockers were avoided secondary to bradycardia.  With new onset seizure activity, she was initiated on Depakote per Pharmacy.  Neurology was consulted for recommendations initially on admit.  She did have a repeat CT of the head on March 16, 2011, which demonstrated findings consistent with stable hypertensive encephalopathy.  No evidence of acute hemorrhage. An EEG was also performed on March 16, 2011, which  demonstrated abnormal finding secondary to epileptiform activity over the left temporal region consistent with the patient's history of seizure.  It is recommended at the time of discharge that she continue on Keppra post hospitalization. It is recommended that she do not drive for 6 months.  [name] was placed on initially Bactrim for Morganella UTI, however, this was discontinued due to rising creatinine and she was placed on ciprofloxacin for UTI for 3 days.  This was discussed with Neurology as ciprofloxacin can lower seizure threshold.  HOSPITAL COURSE BY DISCHARGE DIAGNOSES: 1. Hypertensive crisis:  As per HPI, [name] was admitted on March 12, 2011, with acute hypertensive crisis in the setting of abrupt     cessation of antihypertensive regimen as well as normal pain     control regimen.  She apparently had run out of her chronic pain     medications prior to admission and also had stopped taking her     antihypertensive.  She presented with systolic pressures in the     119J and did subsequently have a seizure in the setting of     hypertensive crisis.  During hospital course, antihypertensive     control was achieved with IV medications.  At the time of     discharge, she will be maintained on Benicar, hydralazine, and     hydrochlorothiazide.  She was not placed on beta-blockers secondary     to bradycardia. 2. Seizures secondary to hypertensive crisis:  [name] unfortunately     did suffer seizure during hospital course.  This was thought likely     in the setting of hypertensive crisis.  Neurology was consulted and     follow along during hospital course.  At the time of discharge,     they recommended her to continue on Keppra with no driving for the     next 6 months. 3. Morganella UTI:  [name] was placed on Bactrim since March 17, 2011.  However secondary to creatinine rise, she was changed to     ciprofloxacin for a total of 3 days by mouth.  This was  discussed     with Neurology as Cipro can lower seizure threshold. 4. Hyperglycemia:  She did not require any sliding scale insulin     during hospital course. 5. Acute renal failure:  She did have an acute rise in her serum     creatinine after initiation of Bactrim for Morganella UTI.  She     also was started on Benicar at that time, however, this was an old     medication for her, she previously was taking prior to discharge.     It is recommended that she have a followup BMP and CBC at her     followup appointment with Rubye Oaks on March 22, 2011, at 2:30     p.m. 6.  Acute respiratory failure in the setting of benzodiazepine use:     This was transient during hospital course and quickly was resolved.     At the time of discharge, she has no respiratory needs and it     appears to be at baseline.  DISCHARGE INSTRUCTIONS: 1. Activity:  Activity as tolerated with physical therapy. 2. No driving for 6 months. 3. Diet:  Low sodium, heart healthy. 4. Followup:  She is to follow with Rubye Oaks, nurse practitioner     at [facility] on March 22, 2011, at 2:30 p.m.  She has been     instructed to arrive at 2 p.m. for labs. 5. She is to follow up with her primary care provider, Dr. Nathanial Rancher on     April 09, 2011, at 3 p.m. and she also has scheduled followup with     Dr. Pearlean Brownie on May 03, 2011, at 12 p.m.  DISCHARGE MEDICATIONS: 1. Ciprofloxacin 250 mg by mouth twice daily. 2. Hydralazine 10 mg by mouth every 6 hours. 3. Benicar/hydrochlorothiazide 40/25 mg 1 tablet by mouth daily. 4. Keppra 250 mg by mouth twice daily. 5. Alprazolam 0.5 mg by mouth 3 times daily as needed for anxiety. 6. Carvedilol 12.5 mg by mouth twice daily. 7. Celebrex 200 mg by mouth twice daily with meals. 8. Citalopram 20 mg 2 tablets mouth daily. 9. Fenofibrate 134 mg 1 tablet by mouth daily with meal. 10.Lyrica 150 mg by mouth twice daily. 11.Nexium 1 capsule by mouth daily.  DISPOSITION AT TIME  OF DISCHARGE:  [name] has met maximum benefit of inpatient therapy and is currently medically stable and cleared for discharge pending followup as above.     Canary Brim, NP   ______________________________ Kalman Shan, MD    BO/MEDQ  D:  03/18/2011  T:  03/19/2011  Job:  [id number]  cc:   Burnell Blanks, MD Pramod P. Pearlean Brownie, MD  Electronically Signed by Canary Brim  on 03/23/2011 07:49:05 AM Electronically Signed by Kalman Shan MD on 04/13/2011 12:44:15 AM MedRecNo: [id number] [facility], Account: [id number], DocSeq: [id number] 30 minutes discharge planning Electronically Signed by Kalman Shan MD on 04/13/2011 12:44:04 AM

## 2011-03-24 LAB — URINE CULTURE
Colony Count: 6000
Culture  Setup Time: 201204100450
Special Requests: NEGATIVE

## 2011-03-24 LAB — BASIC METABOLIC PANEL
BUN: 23 mg/dL (ref 6–23)
CO2: 30 mEq/L (ref 19–32)
Chloride: 104 mEq/L (ref 96–112)
Creatinine, Ser: 1.44 mg/dL — ABNORMAL HIGH (ref 0.4–1.2)

## 2011-03-24 LAB — CBC
MCH: 29.7 pg (ref 26.0–34.0)
MCHC: 32.1 g/dL (ref 30.0–36.0)
MCV: 92.5 fL (ref 78.0–100.0)
Platelets: 221 10*3/uL (ref 150–400)
RBC: 5.05 MIL/uL (ref 3.87–5.11)

## 2011-03-24 LAB — GLUCOSE, CAPILLARY: Glucose-Capillary: 119 mg/dL — ABNORMAL HIGH (ref 70–99)

## 2011-03-25 ENCOUNTER — Inpatient Hospital Stay (HOSPITAL_COMMUNITY): Payer: Medicare Other

## 2011-03-25 DIAGNOSIS — I959 Hypotension, unspecified: Secondary | ICD-10-CM | POA: Insufficient documentation

## 2011-03-25 LAB — CBC
HCT: 42.8 % (ref 36.0–46.0)
MCV: 91.3 fL (ref 78.0–100.0)
RBC: 4.69 MIL/uL (ref 3.87–5.11)
WBC: 5.9 10*3/uL (ref 4.0–10.5)

## 2011-03-25 LAB — BASIC METABOLIC PANEL
BUN: 23 mg/dL (ref 6–23)
Chloride: 100 mEq/L (ref 96–112)
Glucose, Bld: 106 mg/dL — ABNORMAL HIGH (ref 70–99)
Potassium: 4.6 mEq/L (ref 3.5–5.1)

## 2011-03-25 NOTE — Assessment & Plan Note (Signed)
Admit today

## 2011-03-25 NOTE — Consult Note (Signed)
  [name], REACH                ACCOUNT NO.:  [id number]  MEDICAL RECORD NO.:  [id number]           PATIENT TYPE:  I  LOCATION:  3310                         FACILITY:  [facility]  PHYSICIAN:  Thana Farr, MD    DATE OF BIRTH:  Dec 05, 1956  DATE OF CONSULTATION:  03/16/2011 DATE OF DISCHARGE:                                CONSULTATION   CONSULT CALLED BY:  Kalman Shan, MD  HISTORY OF PRESENT ILLNESS:  Mr. [name] is a 55 year old female that initially presented on March 12, 2011, in hypertensive crisis.  Imaging showed evidence of PRES and the patient developed seizures.  The patient was started on valproic acid.  The patient has now improved.  Consult is called for need for continued anticonvulsants.  PAST MEDICAL HISTORY: 1. Hypertension. 2. Diabetes. 3. Dyslipidemia. 4. Anxiety. 5. Depression. 6. L4-5 laminectomy and diskectomy. 7. Right knee surgery. 8. Left carpal tunnel release.  SOCIAL HISTORY:  The patient is married.  She is a smoker.  There is no history of alcohol or illicit drug abuse.  MEDICATIONS:  Apresoline, Benicar, Lyrica, hydrochlorothiazide, NovoLog, and heparin.  Depakote was started yesterday.  PHYSICAL EXAMINATION:  Blood pressure 115/59, heart rate 72, respiratory rate 13, and T-max 99.1.  On mental status testing, the patient is alert and oriented.  She can follow commands, although there is some delay at times.  Speech is fluent.  On cranial nerve testing, II, visual fields grossly intact.  III, IV, and VI, extraocular movements are intact, there is a mild left ptosis.  V and VII, smile symmetric.  VIII, grossly intact.  IX and VII, positive gag.  XI, bilateral shoulder shrug.  XII, midline tongue extension.  On motor exam, the patient is 5/5 throughout. There is normal tone and bulk.  Sensory, the patient reports intact pinprick and light touch bilaterally.  Deep tendon reflexes are 1+ throughout with absent ankle jerks.  Plantars are  mute bilaterally.  On cerebellar testing, finger-to-nose and heel-to-shin intact.  LABORATORY DATA:  Sodium 134, potassium 3.3, chloride 94, bicarb 28, BUN and creatinine 14 and 0.84 respectively, glucose 82, calcium 9.7, magnesium 1.9.  CBC:  White blood cell count 7.4, platelet count 226, hemoglobin and hematocrit 17.3 and 49.3 respectively.  Head CT from March 12, 2011, shows patchy hypoattenuation consistent with PRES.  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  The patient was placed on Depakote.  This has recently been discontinued.  Consult is called for the need to continue the anticonvulsants.  The patient is on Lyrica at baseline.  PLAN: 1. Would repeat head CT without contrast. 2. EEG. 3. After above, will consider the need for additional AED     treatment.          ______________________________ Thana Farr, MD     LR/MEDQ  D:  03/16/2011  T:  03/17/2011  Job:  [id number]  Electronically Signed by Thana Farr MD on 03/25/2011 11:25:40 AM

## 2011-03-29 ENCOUNTER — Ambulatory Visit (INDEPENDENT_AMBULATORY_CARE_PROVIDER_SITE_OTHER): Payer: Medicare Other | Admitting: Adult Health

## 2011-03-29 ENCOUNTER — Other Ambulatory Visit (INDEPENDENT_AMBULATORY_CARE_PROVIDER_SITE_OTHER): Payer: Medicare Other

## 2011-03-29 ENCOUNTER — Encounter: Payer: Self-pay | Admitting: Adult Health

## 2011-03-29 VITALS — BP 100/60 | HR 64 | Temp 97.8°F | Ht 66.5 in | Wt 196.6 lb

## 2011-03-29 DIAGNOSIS — R0902 Hypoxemia: Secondary | ICD-10-CM | POA: Insufficient documentation

## 2011-03-29 DIAGNOSIS — I10 Essential (primary) hypertension: Secondary | ICD-10-CM | POA: Insufficient documentation

## 2011-03-29 DIAGNOSIS — N179 Acute kidney failure, unspecified: Secondary | ICD-10-CM | POA: Insufficient documentation

## 2011-03-29 DIAGNOSIS — J449 Chronic obstructive pulmonary disease, unspecified: Secondary | ICD-10-CM

## 2011-03-29 HISTORY — DX: Acute kidney failure, unspecified: N17.9

## 2011-03-29 LAB — BASIC METABOLIC PANEL
CO2: 36 mEq/L — ABNORMAL HIGH (ref 19–32)
Calcium: 10.2 mg/dL (ref 8.4–10.5)
Creatinine, Ser: 1.6 mg/dL — ABNORMAL HIGH (ref 0.4–1.2)
Glucose, Bld: 94 mg/dL (ref 70–99)

## 2011-03-29 NOTE — Assessment & Plan Note (Signed)
Cont on nocturnal O2 and prn w/ act.

## 2011-03-29 NOTE — Patient Instructions (Signed)
Begin Symbicort 160/4.59mcg 2 puffs Twice daily   MOST IMPORTANT IS STOP SMOKING.  Continue on Oxygen at night and may use with activity as needed.  follow up Dr. Delford Field  In 4 weeks and As needed

## 2011-03-29 NOTE — Assessment & Plan Note (Addendum)
Scr 1.2 during hospital stay suspected from volume depletion Plan bmet pending.

## 2011-03-29 NOTE — Progress Notes (Signed)
Subjective:    Patient ID: [name], female    DOB: 08/09/1956, 55 y.o.   MRN: [id number]  HPI 55 yo WF with hx of COPD, active smoker.   03/22/2011   She was admitted 3/30-03/18/11 for Hypertensive crisis: with acute hypertensive crisis in the setting of abrupt cessation of antihypertensive regimen  And she apparently had run out of her chronic pain medications prior to admission  CT head c/w PRES.  During hospital course, antihypertensive control was achieved with IV medications. At the time of discharge, she will be maintained on Benicar, hydralazine, and hydrochlorothiazide. She did suffer seizure during hospital course. Felt secondary to hypertensive crisis. Neurology was consulted and placed on Keppra. She also had a Morganella UTI: Discharged on Cipro. She did has some transient respiratory failure that resolved with decreased benzodiazepine use At discharge no desaturations.  On arrival blood pressure is systolic of 66/40 and O2 sat at 79% on room air. >>Admtted    03/29/2011 Post hospital visit.  Pt was readmitted to hospital on 4/9-4/12/12 for Hypotension and Hypoxemia , COPD . She was  recently admitted for  Hypertensive crisis for suspected med noncompliance with PRESS on CT.  She was given IV hydration for suspected volume depletion. Felt she had overcorrection of b/p on meds. Meds were adjusted in hospital. Scr was elevated at 1.2 . BMET today is pending . Since discharge she is still weak but feeling better.  Tolerating meds well .  During hospital stay PFT showed FEV1 at 2.14  L/m -73% DLCO 88%. She is on O2 at night at 2 l/m and 2l/m As needed   She was started on symbicort at discharge but has not started this as of yet. Wanted to discuss the PI insert on side effects.     Review of Systems Constitutional:   No  weight loss, night sweats,  Fevers, chills,    HEENT:   No headaches,  Difficulty swallowing,  Tooth/dental problems, or  Sore throat,                No sneezing,  itching, ear ache, nasal congestion, post nasal drip,   CV:  No chest pain,  Orthopnea, PND, swelling in lower extremities, anasarca, dizziness, palpitations, syncope.   GI  No heartburn, indigestion, abdominal pain, nausea, vomiting, diarrhea, change in bowel habits, loss of appetite, bloody stools.   Resp:  No wheezing.  No chest wall deformity  Skin: no rash or lesions.  GU: no dysuria, change in color of urine, no urgency or frequency.  No flank pain, no hematuria   MS:  No joint pain or swelling.  No decreased range of motion.    Psych:  No change in mood or affect. No depression or anxiety.  No memory loss.         Objective:   Physical Exam GEN: A/Ox3; pleasant , NAD, well nourished   HEENT:  [place]/AT,  EACs-clear, TMs-wnl, NOSE-clear, THROAT-clear, no lesions, no postnasal drip or exudate noted.   NECK:  Supple w/ fair ROM; no JVD; normal carotid impulses w/o bruits; no thyromegaly or nodules palpated; no lymphadenopathy.  RESP  Coarse BS w/ no wheezing  CARD:  RRR, no m/r/g  , no peripheral edema, pulses intact, no cyanosis or clubbing.  GI:   Soft & nt; nml bowel sounds; no organomegaly or masses detected.  Musco: Warm bil, no deformities or joint swelling noted.   Neuro: alert, no focal deficits noted.    Skin: Warm,  no lesions or rashes          Assessment & Plan:

## 2011-03-29 NOTE — Assessment & Plan Note (Addendum)
Improved control on rx  Cont follow up with PCP

## 2011-03-29 NOTE — Assessment & Plan Note (Signed)
Begin Symbicort 160/4.26mcg 2 puffs Twice daily   Most important is to stop smoking.  O2 qhs at 2l/m  And As needed

## 2011-03-29 NOTE — Progress Notes (Signed)
i agree. 

## 2011-03-29 NOTE — Assessment & Plan Note (Signed)
Resolved

## 2011-03-31 NOTE — Discharge Summary (Signed)
[name], [name]                ACCOUNT NO.:  [id number]  MEDICAL RECORD NO.:  [id number]           PATIENT TYPE:  I  LOCATION:  1312                         FACILITY:  [facility]  PHYSICIAN:  Charlcie Cradle. Delford Field, MD, FCCPDATE OF BIRTH:  01/10/1956  DATE OF ADMISSION:  03/22/2011 DATE OF DISCHARGE:  03/25/2011                              DISCHARGE SUMMARY   DISCHARGE DIAGNOSES:  Consists of: 1. Hypertension in the setting of volume depletion. 2. Hypoxemia in the setting of probable chronic obstructive pulmonary     disease. 3. Acute renal failure with admission creatinine of 2.39. 4. Urinary tract infection. 5. Seizure disorder.  HISTORY OF PRESENT ILLNESS:  [name] is a 55 year old white female who has had an extensive past medical history of post hospital followup on March 22, 2011.  She had been admitted from March 12, 2011, through March 18, 2011, with a hypertensive crisis in the setting of abrupt cessation of all of her antihypertensive medications.  She was discharged on multiple antihypertensives, developed diarrhea, became volume depleted and was readmitted to [facility] on March 22, 2011, with hypotension, acute renal failure and hypoxia.  LABORATORY DATA:  Hemoglobin 13.8, hematocrit 42.8, platelets 193.  WBC 5.9.  Sodium 136, potassium 4.6, chloride 100, CO2 of 30, BUN 23. Creatinine on day of discharge was 1.29.  Glucose was 106.  Peak creatinine was 2.66.  AST was 29, ALT was 23, alkaline phosphatase 15, total bilirubin 0.8, albumin 3.5, troponin-I 0.01, CK-MB 7.8, CK 234, calcium 9.3.  C diff is pending.  Urine culture  shows insignificant growth.  C diff toxin was not done on this admission.  RADIOGRAPHIC DATA:  Chest x-ray on March 22, 2011, demonstrates some mild enlargement of cardiac silhouette, no definite acute abnormalities.  HOSPITAL COURSE BY DISCHARGE DIAGNOSES: 1. Hypotension in the setting of volume depletion, overcorrection of  blood pressure with recent discharge, following CREST syndrome.     She also had increasing doses of narcotics for pain relief via the     pain clinic.  Note, she has been recently discharged from the     practice.  Confirmed dose with pain clinic.  She was on 30 mg OxyIR     q.i.d., last filled on February 22, 2011.  Her blood pressure has     returned.  She is being discharged on a reduced dose of Benicar and     hydrochlorothiazide. 2. Hypoxemia in the setting of probable chronic obstructive pulmonary     disease.  She has a history of smoking and continues to smoke half     pack per day.  She is ambulating and demonstrated need for oxygen     by sats being as low as 80% with ambulation.  Therefore she will be     on home O2 and will be placed on Symbicort.  She did have full PFTs     done on day of discharge which revealed an FEV-1 of 2.14 which is     73% of predicted which increased to 2.28 post bronchodilator which     is 78% of  predicted.  Her DLCO2 was 88% of predicted and she is     being discharged on home O2. 3. Her acute renal failure was resolving in the setting of     hypoperfusion with serum creatinine noted 1.26. 4. Her urinary tract infection is resolved. 5. Her seizure disorder is stable.  She had no seizures this     admission.  FOLLOWUP:  She is being discharged to follow up with Dr. Rubye Oaks within 4 days at which time she will have a BMP along with a BMET drawn to further evaluate her renal function.  She has a followup appointment with Dr. Rubye Oaks on March 29, 2011, at 3:30 p.m.; with Dr. Nathanial Rancher, her primary MD, on April 09, 2011, at 3 p.m.; Dr. Pearlean Brownie of Neurology on May 03, 2011, at 12:00 p.m.  MEDICATIONS:  Her medications are well documented on medication reconciliation sheet.  DIET:  Her diet is a heart-healthy, low salt diet.  DISPOSITION/CONDITION ON DISCHARGE:  Improved.  Note this is complex discharge summary and took greater than 45  minutes.     Devra Dopp, MSN, ACNP   ______________________________ Charlcie Cradle. Delford Field, MD, FCCP    SM/MEDQ  D:  03/25/2011  T:  03/25/2011  Job:  [id number]  Electronically Signed by Devra Dopp MSN ACNP on 03/29/2011 04:35:18 PM Electronically Signed by Shan Levans MD FCCP on 03/31/2011 05:27:40 PM

## 2011-04-13 ENCOUNTER — Ambulatory Visit: Payer: Medicare Other | Admitting: Physical Medicine & Rehabilitation

## 2011-04-19 ENCOUNTER — Ambulatory Visit (HOSPITAL_COMMUNITY): Payer: Medicare Other | Attending: Family Medicine | Admitting: Radiology

## 2011-04-19 DIAGNOSIS — I959 Hypotension, unspecified: Secondary | ICD-10-CM | POA: Insufficient documentation

## 2011-04-19 DIAGNOSIS — Z01810 Encounter for preprocedural cardiovascular examination: Secondary | ICD-10-CM

## 2011-04-19 DIAGNOSIS — I10 Essential (primary) hypertension: Secondary | ICD-10-CM | POA: Insufficient documentation

## 2011-04-19 DIAGNOSIS — J449 Chronic obstructive pulmonary disease, unspecified: Secondary | ICD-10-CM | POA: Insufficient documentation

## 2011-04-19 DIAGNOSIS — M1710 Unilateral primary osteoarthritis, unspecified knee: Secondary | ICD-10-CM

## 2011-04-19 DIAGNOSIS — R0609 Other forms of dyspnea: Secondary | ICD-10-CM

## 2011-04-19 DIAGNOSIS — J4489 Other specified chronic obstructive pulmonary disease: Secondary | ICD-10-CM | POA: Insufficient documentation

## 2011-04-19 HISTORY — DX: Essential (primary) hypertension: I10

## 2011-04-19 MED ORDER — TECHNETIUM TC 99M TETROFOSMIN IV KIT
33.0000 | PACK | Freq: Once | INTRAVENOUS | Status: AC | PRN
  Administered 2011-04-19: 33 via INTRAVENOUS

## 2011-04-19 MED ORDER — TECHNETIUM TC 99M TETROFOSMIN IV KIT
10.0000 | PACK | Freq: Once | INTRAVENOUS | Status: AC | PRN
  Administered 2011-04-19: 10 via INTRAVENOUS

## 2011-04-19 MED ORDER — REGADENOSON 0.4 MG/5ML IV SOLN
0.4000 mg | Freq: Once | INTRAVENOUS | Status: AC
  Administered 2011-04-19: 0.4 mg via INTRAVENOUS

## 2011-04-19 NOTE — Progress Notes (Signed)
[facility] SITE 3 NUCLEAR MED [address] [place] [address] [phone]  Cardiology Nuclear Med Study  [name] is a 55 y.o. female [id number] May 06, 1956   Nuclear Med Background Indication for Stress Test:  Evaluation for Ischemia and Surgical Clearance for Pending (R) TKR by Dr. Jonny Ruiz Rendall History:  No previous documented CAD and COPD Cardiac Risk Factors: Family History - CAD, Hypertension, Lipids, NIDDM, Obesity and Smoker  Symptoms:  Fatigue, Palpitations and Rapid HR   Nuclear Pre-Procedure Caffeine/Decaff Intake:  None NPO After: 8:00pm   Lungs:  Clear.  O2 sat 97% on RA. IV 0.9% NS with Angio Cath:  22g  IV Site: R Hand  IV Started by:  Irean Hong, RN  Chest Size (in):  42 Cup Size: C  Height: 5' 6.5" (1.689 m)  Weight:  195 lb (88.451 kg)  BMI:  Body mass index is 31.00 kg/(m^2). Tech Comments:  Held Bystolic 48 hrs    Nuclear Med Study 1 or 2 day study: 1 day  Stress Test Type:  Lexiscan  Reading MD: Charlton Haws, MD  Order Authorizing Provider:  Burnell Blanks, MD  Resting Radionuclide: Technetium 19m Tetrofosmin  Resting Radionuclide Dose: 10 mCi   Stress Radionuclide:  Technetium 61m Tetrofosmin  Stress Radionuclide Dose: 33 mCi           Stress Protocol Rest HR: 51 Stress HR: 73  Rest BP: 120/61 Stress BP: 132/68  Exercise Time (min): n/a METS: n/a   Predicted Max HR: 166 bpm % Max HR: 43.98 bpm Rate Pressure Product: 9636   Dose of Adenosine (mg):  n/a Dose of Lexiscan: 0.4 mg  Dose of Atropine (mg): n/a Dose of Dobutamine: n/a mcg/kg/min (at max HR)  Stress Test Technologist: Smiley Houseman, MD  Nuclear Technologist:  Domenic Polite, CNMT     Rest Procedure:  Myocardial perfusion imaging was performed at rest 45 minutes following the intravenous administration of Technetium 53m Tetrofosmin. Rest ECG: Sinus bradycardia.  Stress Procedure:  The patient received IV Lexiscan 0.4 mg over 15-seconds.  Technetium 61m  Tetrofosmin injected at 30-seconds.  There were no significant changes with Lexiscan.  She did c/o chest pressure with infusion.  Quantitative spect images were obtained after a 45 minute delay. Stress ECG: No significant change from baseline ECG  QPS Raw Data Images:  Normal; no motion artifact; normal heart/lung ratio. Stress Images:  Breast Attenuation and motion Rest Images:  Motion artifact Subtraction (SDS):  SDS 5 scattered Transient Ischemic Dilatation (Normal <1.22):  0.94 Lung/Heart Ratio (Normal <0.45):  0.30  Quantitative Gated Spect Images QGS EDV:  119 ml QGS ESV:  38 ml QGS cine images:  NL LV Function; NL Wall Motion QGS EF: 68%  Impression Exercise Capacity:  Lexiscan with no exercise. BP Response:  Normal blood pressure response. Clinical Symptoms:  Mild chest pain/dyspnea. ECG Impression:  No significant ST segment change suggestive of ischemia. Comparison with Prior Nuclear Study: No previous nuclear study performed  Overall Impression:  Low risk stress nuclear study. Likely breast attenuation with no ischemia       Charlton Haws

## 2011-04-20 NOTE — Progress Notes (Addendum)
COPY FAXED TO Dr. Nathanial Rancher.[name]

## 2011-04-27 ENCOUNTER — Ambulatory Visit: Payer: Medicare Other | Admitting: Critical Care Medicine

## 2011-04-27 NOTE — Procedures (Signed)
[name], [name]                ACCOUNT NO.:  [id number]   MEDICAL RECORD NO.:  [id number]          PATIENT TYPE:  REC   LOCATION:  TPC                          FACILITY:  [facility]   PHYSICIAN:  [name], M.D.DATE OF BIRTH:  October 29, 1956   DATE OF PROCEDURE:  DATE OF DISCHARGE:                               OPERATIVE REPORT   PROCEDURE:  Left sacroiliac injection.   INDICATIONS:  Low back and buttock pain going into bilateral thighs.  She has had a bilateral sacroiliac injection done last month.  Only the  left side responded.  This will be repeat injection on the left.  Her  pain is rated as an 8/10, interfering with meal prep, household duties  and shopping.  She plans surgical consultation for further evaluation  next month.   DESCRIPTION OF PROCEDURE:  Informed consent was obtained after  describing risks and benefits of the procedure to the patient.  These  include bleeding, bruising, infection, temporary or permanent lower  extremity weakness.  She elects to proceed and has given written  consent.  The patient was placed prone on fluoroscopy table.  Betadine  prep, sterile drape, 25-gauge, 1 and 1/2 inch needle was used to incise  skin and subcu tissue 1% lidocaine x2 mL.  Then a 25 gauge 3 inch spinal  needle was inserted under fluoroscopic guidance in the left SI joint.  AP, lateral and oblique imaging utilized.  Omnipaque 180 x0.5 mL  demonstrated good joint outline, no intravascular uptake followed by  injection of 1 mL of 40 mg/mL Depo-Medrol, 1 mL of 2% MPF lidocaine.  The patient tolerated the procedure well.  Pre and post injection vitals  stable.  Pre and post injection pain level equal.  Will monitor over  time as corticosteroids become active.      [name], M.D.  Electronically Signed     AEK/MEDQ  D:  03/11/2008 09:42:15  T:  03/11/2008 12:02:20  Job:  [id number]   cc:   Donalee Citrin, M.D.  Fax: [phone]

## 2011-04-27 NOTE — Assessment & Plan Note (Signed)
On her last visit, September 11, 2007, she had a bilateral L5  transforaminal injection.  She had about 10 days of relief, then  recurrence of pain.  Her pain went down approximately 50%.  She has had  another bilateral L5 transforaminal injection  with temporary  improvement on 11 and again, May 19, 2007.   She has had no other medical problems in the interval time.   MEDICATIONS:  1. Percocet 10/325, up to 5 times a day.  2. She continues on Lyrica 150 b.i.d.   She has had allergies to POLLEN but no medication allergies.   Her pain level is 7-8/10.  She can walk 10 minutes at a time.  She can  drive.  She needs assistance with meal prep, household duties, and  shopping.  She has probable confusion, depression, anxiety, limb  swelling.   She is married and lives with her husband and son.   PHYSICAL EXAMINATION:  Blood pressure 147/86, pulse 85, respirations 18,  O2 sat 94% on room air.  GENERAL:  In no acute distress.  Mood and affect appropriate.  Orientation x3.  Affect is bright and alert.  Gait is normal.  BACK:  Some tenderness to palpation in the bilateral lumbosacral  junction.  She has normal strength in the lower extremity, normal deep  tendon reflexes in the knees, mildly reduced in the ankles.  She has  mild ankle edema.   IMPRESSION:  Lumbar degenerative disk with chronic radiculopathy.  She  wants to avoid surgery, basically at all costs.  Certainly, she is not  having any signs of cauda equina syndrome, which would necessitate  emergent referral.  Will continue with current medications.  Would need  to hold off on further injections at this point, in terms of radicular  pain.  She may benefit from medial branch blocks, should her axial pain  become a more predominant symptom.  Her main radiculopathy sign is S1  reflex, right greater than left reduction and reduced sensation in that  area.      [name], M.D.  Electronically Signed      AEK/MedQ  D:  10/11/2007 13:19:33  T:  10/11/2007 22:16:38  Job #:  [id number]

## 2011-04-27 NOTE — Discharge Summary (Signed)
NAMEMEGHANN, LANDING                ACCOUNT NO.:  [id number]   MEDICAL RECORD NO.:  [id number]          PATIENT TYPE:  INP   LOCATION:  3029                         FACILITY:  [facility]   PHYSICIAN:  Hilda Lias, M.D.   DATE OF BIRTH:  [birth year]   DATE OF ADMISSION:  04/18/2009  DATE OF DISCHARGE:  04/23/2009                               DISCHARGE SUMMARY   This discharge summary dictated by me without  the patient my last night L Y LDS history number [id number] the patient  was admitted May 7 discharged May 12.   ADMISSION DIAGNOSES:  L3-L4 spondylolisthesis, degenerative disk disease  L4-5 and L5-S1.   FINAL DIAGNOSES:  L3-L4 spondylolisthesis, degenerative disk disease L4-  5 and L5-S1.   CLINICAL HISTORY:  The patient was admitted because of back pain with  radiation to both legs.  The patient has failed with conservative  treatment.  X-ray showed that she has spondylolisthesis at the level of  L3-4 with degenerative disk disease with foraminal stenosis at L4-5 and  L5-S1.  Surgery was advised.  Laboratory normal.   COURSE IN THE HOSPITAL:  The patient was taken to surgery on May 7 and  bilateral L3, L4, and L5 laminectomy with fusion using cages and pedicle  screw was done.  The patient doing fairly well and the only problem was  she had some difficulty urinating.  Last night, she was given Flomax and  she had been able to void since then.  Today, she is doing much better  and she is going to be discharged today.  The patient is going to stay  with her sister.   CONDITION AT DISCHARGE:  Stable.   MEDICATIONS:  Percocet, diazepam, and Flomax for 5 days.   DIET:  She was encouraged to lose weight.   ACTIVITY:  Not to drive for at least 2 weeks.  Not to bend.   FOLLOWUP:  She will be seen by me in 3 weeks.           ______________________________  Hilda Lias, M.D.     EB/MEDQ  D:  04/23/2009  T:  04/24/2009  Job:  [id number]

## 2011-04-27 NOTE — Procedures (Signed)
[name], [name]                ACCOUNT NO.:  [id number]   MEDICAL RECORD NO.:  [id number]          PATIENT TYPE:  REC   LOCATION:  TPC                          FACILITY:  [facility]   PHYSICIAN:  [name], M.D.DATE OF BIRTH:  1955/12/29   DATE OF PROCEDURE:  05/19/2007  DATE OF DISCHARGE:                               OPERATIVE REPORT   PROCEDURE:  Bilateral S1 transforaminal epidural steroid injection.   INDICATION:  Lumbar radiculitis secondary to lumbar stenosis.   Informed consent was obtained after describing risks and benefits of  procedure to the patient.  These included bruising, infection, loss of  bowel and bladder function, temporary or permanent paralysis.  She  elects to proceed and has given written consent. The patient was placed  prone on the fluoroscopy table.  Betadine prep, sterile drape.  A 25  gauge 1 1/2 inch needle was used to anesthetize the skin and subcu  tissue, 1% lidocaine x2 mL.  Then, a 22 gauge 3 1/2 inch spinal needle  was inserted first into the left S1 foramen under AP and lateral  imaging.  Omnipaque 180 x 0.5 mL demonstrated no intravascular uptake  and nerve root outline followed by injection of 2.5 mL of a solution  containing 1.5 mL of 40 mg/mL Depo-Medrol and 2.5 mL of 1% lidocaine.  Then, the same procedure was repeated on the right side with the same  technique and injectate.  The patient tolerated the procedure well.  Pre-  injection pain level 7/10.  Post injection 9/10, likely secondary to  prone lying position.  I have given her post injection instructions on  this. Return in one month for possible reinjection. May need to put  additional padding underneath the abdomen at that time.  Pre and post  injection vitals stable.      [name], M.D.  Electronically Signed     AEK/MEDQ  D:  05/19/2007 13:32:11  T:  05/19/2007 16:58:30  Job:  [id number]

## 2011-04-27 NOTE — Procedures (Signed)
[name], [name]                ACCOUNT NO.:  [id number]   MEDICAL RECORD NO.:  [id number]         PATIENT TYPE:  AECP   LOCATION:                                 FACILITY:   PHYSICIAN:  [name], M.D.DATE OF BIRTH:  10-[name]-1956   DATE OF PROCEDURE:  DATE OF DISCHARGE:                               OPERATIVE REPORT   Goth follows up today for all the physical therapy, only had 4 visits,  but states that she got some relief with some stretching.  She does have  right hip pain and had prior right troch bursa injection several months  ago, ie on March 11, 2008.  She has seen nursing since I saw her back in  May.  She has recently been diagnosed with diabetes, currently diet-  controlled.  Blood sugars 90 to 130 in the morning.  Her pain below the  knee has improved.  She has been on Lyrica at 225 b.i.d. dose at least 3  months.  No significant side effects.  She does have some swelling in  the legs, but not severe.  Her average pain is about 5/10, which is  actually better than last visit where it was averaging 6 to 7.  No foot  tingling at this point.   Pain interference scores with general activity is 7.  Sleep is fair.  Pain is worse with walking, sitting, activity, standing; better with  rest, heat, medication; relief from meds is fair.  Uses cane in the  morning, walks 15 minutes at a time, climb steps, drives, needs assist  with meal prep, household duties, and shopping.   PHYSICAL EXAMINATION:  VITAL SIGNS:  Blood pressure is 107/46, pulse 76,  respirations 20, and O2 sat 97% on room air.  GENERAL:  No acute distress, obese female.  Affect is alert.  Gait is  normal.  She is able to toe walk, heel walk short distance.  BACK:  Some tenderness to palpation in lumbosacral junction.  Pain over  the right troch bursa, greater than left side.  Her strength is normal  in the biceps, triceps, deltoid, grip, hip flexion, knee extension, and  ankle dorsiflexion.  Hip  internal and external rotation is normal.  No  evidence of knee effusion.  There is 1+ pitting edema in bilateral  pretibial area and to the pedal area.   IMPRESSION:  1. Lower extremity pain, improved, question whether she may have a      diabetic neuropathy or whether it is due to lumbar spinal stenosis.  2. Chronic low back pain.  She has multilevel disk disease at L3-L4,      L4-L5, and L5-S1.  I think she is quite stable with this at this      point.  3. The right hip and trochanteric bursitis, chronic, recurrent.  We      will reinject today.  We will continue oxycodone 10/325 q.4 h.      p.r.n., #180 for a month.  4. Continue Lyrica 225 b.i.d.   Consider EMG and CV if recurrent lower extremity pain.  [name], M.D.  Electronically Signed     AEK/MEDQ  D:  07/09/2008 14:05:41  T:  07/10/2008 03:25:10  Job:  [id number]

## 2011-04-27 NOTE — Procedures (Signed)
NAMEMYSTIC, LABO                ACCOUNT NO.:  [id number]   MEDICAL RECORD NO.:  [id number]           PATIENT TYPE:   LOCATION:                                 FACILITY:   PHYSICIAN:  [name], M.D.DATE OF BIRTH:  11-19-1956   DATE OF PROCEDURE:  02/08/2008  DATE OF DISCHARGE:                               OPERATIVE REPORT   PROCEDURE:  Bilateral sacroiliac injection under fluoroscopic guidance.   INDICATIONS:  Bilateral low back/buttock pain.  Pain was not relieved by  lumbar facet injections.  Her pain is only partially responsive to  medication management including narcotic analgesics, and it interferes  with meal prep, household duties, shopping as well as ambulation.   Her pain is rated as a 6-7/10.  Informed consent was obtained after  describing risks and benefits of the procedure to the patient.  These  include bleeding, bruising, infection, temporary or permanent paralysis.  She elects to proceed and has given written consent.  The patient was  placed prone on fluoroscopy table.  Betadine prep, sterile drape.  A 25-  gauge 1-1/2 inch needle was used to anesthetize the skin and  subcutaneous tissue.  One percent lidocaine x2 mL at each site followed  by insertion of a 25-gauge 3-1/2 inch needle into the left SI joint.  AP, lateral and oblique imaging utilized.  Omnipaque 180 x 0.5 mL  demonstrated no intravascular uptake.  Then 1 mL of 2% MPF lidocaine  plus 0.5 mL 40 mg per mL Depo-Medrol were injected.  This same procedure  was repeated on the right side using the same needle, injectate, and  technique.  The patient tolerated the procedure well.  Pre and post  injection vitals stable.  Post injection instructions given.  Pre and  post injection pain level 7/10.      [name], M.D.  Electronically Signed     AEK/MEDQ  D:  02/08/2008 12:53:25  T:  02/09/2008 11:05:00  Job:  [id number]

## 2011-04-27 NOTE — Assessment & Plan Note (Signed)
She had last visit was on November 01, 2008.  [name] follows up with  me today.  She has main complaint of back pain as well as hip pain as  well as knee pain.  She has left knee osteoarthritis.  We sent her to  Dr. Chaney Malling for evaluation.  She was placed in a knee brace and tried  on some anti-inflammatories.  She states that surgical intervention has  not been planned.  She ran out of her Lyrica.  She had increased pain  after running out.  She is back on Lyrica 225 b.i.d. and oxycodone  10/325 two p.o. q.i.d.   [place] Controlled Substance Agreement Registry shows no evidence  of multiple prescribers.   Her pain was 6/10 an average, currently at 8, mainly in the back as well  as left knee.  Pain increases with activity to 6 or 7/10 level.  Pain is  worse with walking, bending, sitting, standing; improves with rest,  heat, and medications.  Relief from meds is good.  She uses a cane  sometimes in the morning.  She drives, she goes up and down steps.  Functionally, she is independent.  She can do meal prep, but has some  limitations of certain hospital duties as well as shopping.   REVIEW OF SYSTEMS:  Positive for numbness, tingling, trouble walking,  confusion, depression, anxiety, but negative for suicidal thoughts.   PHYSICAL EXAMINATION:  VITAL SIGNS:  Blood pressure 151/88, pulse 73,  respirations 18, O2 sat 96% on room air  GENERAL:  Overweight female, in no acute distress.  EXTREMITIES:  Without edema.  Orientation x3.  Affect is alert.  Gait is  with a limp.  She has obvious deformity in the left knee and favors that  side.  Lower extremity strength is 5/5.  Hip range of motion is normal.  Knee range of motion is normal in terms of flexion and extension.  There  is some crepitus on the left side.   IMPRESSION:  1. Lumbar spinal stenosis.  2. Left knee osteoarthritis.   PLAN:  1. We will continue Lyrica 225 b.i.d.  She gets this through this      office.  2.  We will continue oxycodone 10/325 two p.o. q.i.d.  Should she have      diminishing effect, we would switch her to long-acting morphine at      about 60 mg b.i.d.      [name], M.D.  Electronically Signed     AEK/MedQ  D:  11/28/2008 14:51:52  T:  11/29/2008 07:35:03  Job #:  [id number]   cc:   Burnell Blanks, MD  Fax: [phone]   Lenard Galloway. Chaney Malling, M.D.  Fax: [phone]

## 2011-04-27 NOTE — Group Therapy Note (Signed)
Ms. Jakubowicz is a 55 year old female with consult requested by Dr. Nathanial Rancher  to evaluate low back and lower extremity pain.  She has a history of  lumbar spinal stenosis diagnosed per neurosurgery last year.  She has  congenitally short lumbar pedicles as well as multi-level disk disease  at L3-4, L4-5 and L5-S1.  She has had primarily low back pain over the  last year but over the last few months, she has had increasing radiation  into the legs described as pain down the back of the legs and numbness  in the front of the legs.  She has finished some physical therapy a  couple of months ago at [facility].  She still keeps  up with some of the home exercise program.  Her other past medical  history is significant for allergies and triglyceridemia, panic  disorder, depression, anxiety, lower extremity osteoarthritis.  She has  been taking Percocet 10/650 up to 5 times a day prescribed by her  primary care physician, Dr. Nathanial Rancher.  She is now referred here for  further assessment and management.  Other pain medications include  Lyrica 50 mg 1 p.o. t.i.d., she is on alprazolam 0.5 b.i.d.  She is also  on Lexapro 20 mg a day.  She states that she has taken some hydrocodone  given that she has run low on her oxycodone.   SOCIAL HISTORY:  Denies any illegal drug use.  She lives with her  husband and son.  She is applying for disability.  She has been turned  down once, has reapplied.  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  Her  average pain is 5/10 in the back and legs but sparing her feet.  Her  pain is described as sharp, intermittent, stabbing, tingling and aching,  increasing with walking, relieved with sitting.  She sometimes uses a  cane.  She drives.  She does not use a lot of steps.  She can walk 15  minutes at a time.   REVIEW OF SYSTEMS:  Depression, anxiety, trouble walking and tingling.   EXAMINATION:  VITAL SIGNS:  Blood pressure 120/80, pulse 90, respiratory  rate 18, O2 sat 94% on room air.  GENERAL:  No acute distress.  Mood and affect appropriate.  BACK:  No tenderness to palpation.  Her lumbar spine range of motion is  reduced to 75%.  Forward flexion only 50% extension.  She has normal  lower extremity strength and range of motion as well as sensation to pin  prick, light touch and vibratory sense.  Hip range of motion reduced.  Internal external rotation on the left side only.   IMPRESSION:  Lumbar spinal stenosis with lower extremity radicular  symptoms.  She has not had any injection therapy.  She has already been  through physical therapy.   PLAN:  1. Will set her up for bilateral S1 transforaminal steroid injection      as a temporizing measure as she desires to avoid surgery for as      long as possible.  2. Continue home exercise program  3. Check urine drug screen and if negative for illicit drugs, or      undisclosed opiates take over prescription in this regard.  See her      back in about 2 weeks for the injection.      [name], M.D.  Electronically Signed     AEK/MedQ  D:  05/12/2007 14:38:35  T:  05/12/2007 15:16:12  Job #:  [id number]

## 2011-04-27 NOTE — Procedures (Signed)
[name], [name]                ACCOUNT NO.:  [id number]   MEDICAL RECORD NO.:  [id number]          PATIENT TYPE:  REC   LOCATION:  TPC                          FACILITY:  [facility]   PHYSICIAN:  [name], M.D.DATE OF BIRTH:  Apr 19, 1956   DATE OF PROCEDURE:  DATE OF DISCHARGE:                               OPERATIVE REPORT   PROCEDURE:  Bilateral L5 dorsal ramus injection, bilateral L4 medial  branch block and bilateral L3 medial branch block under fluoroscopic  guidance.   ATTENDING:  [name], M.D.   INDICATIONS:  Lumbar facet-mediated pain; pain is only partially  responsive to narcotic analgesic medications.   Informed consent was obtained after describing the risks and benefits of  the procedure to the patient; these include bleeding, bruising,  infection, loss of bowel or bladder function, temporary or permanent  paralysis.  She elects to proceed and has given written consent.  Her  pain does interfere with meal prep, household duties, shopping and she  can only walk 5 minutes at a time.   DESCRIPTION OF PROCEDURE:  The patient placed prone on the fluoroscopy  table with Betadine prep and sterile drape.  A 25-gauge inch-and-a-half  needle was used to anesthetize the skin and subcu tissue with lidocaine  x2 mL at each of 6 sites.  Then a 22-gauge 5-inch spinal needle was  inserted under fluoroscopic guidance, first targeting the left S1 SAP-  sacral ala junction, bone contact made, confirmed with lateral imaging.  Omnipaque 180 x 0.5 mL demonstrated no intravascular uptake, then 0.5 mL  of a solution containing 1 mL of 40 mg/mL of dexamethasone and 2 mL of  2% MPF lidocaine.  Then the left L5 SAP-transverse process junction  targeted, bone contact made, confirmed with lateral imaging.  Omnipaque  180 x 0.5 mL demonstrated no intravascular uptake, then 0.5 mL of  dexamethasone/lidocaine solution injected.  The left L4 SAP transverse  process junction was  targeted, bone contact made, confirmed lateral  imaging.  Omnipaque 180 x 0.5 mL demonstrated no intravascular uptake.  Then 0.5 mL of dexamethasone/lidocaine solution was injected.  The same  procedure was repeated at corresponding levels on the right side using  the same technique, procedure, equipment and injectate.  The patient  tolerated the procedure well.  Preinjection pain level was 8/10,  postinjection pain level about 7/10.  We will get a pain diary and  further assess that next visit.      [name], M.D.  Electronically Signed     AEK/MEDQ  D:  10/23/2007 14:48:14  T:  10/24/2007 08:07:01  Job:  [id number]

## 2011-04-27 NOTE — H&P (Signed)
[name], [name]                ACCOUNT NO.:  [id number]   MEDICAL RECORD NO.:  [id number]          PATIENT TYPE:  INP   LOCATION:  3029                         FACILITY:  [facility]   PHYSICIAN:  Hilda Lias, M.D.   DATE OF BIRTH:  [birth year]   DATE OF ADMISSION:  04/18/2009  DATE OF DISCHARGE:                              HISTORY & PHYSICAL   Ms. Duplechain is a lady who had been seen in my office for almost 3 years  complaining of back pain with radiation to both legs.  The pain gets  worse with walking but lately it gets worse with sitting and standing.  She has the diagnosis of lumbar stenosis with neurogenic claudication  back in 2007.  Nevertheless, she decided not to have surgery.  She came  to see me in my office several weeks ago complaining that the pain was  getting worse and now it was up to the point that walking less than 50  feet.  She had to sit because the pain was unbearable.  This lady had  conservative treatment including physical therapy and she is not any  better.  She had [facility] by [facility].   PAST MEDICAL HISTORY:  She had surgery in the knee and also in the hand  secondary to carpal tunnel syndrome.   She is not allergic to any medication.   SOCIAL HISTORY:  She smokes half a pack a day.  She does not drink.  She  is 5 feet 7 inches, 235 pounds.   FAMILY HISTORY:  Father died of heart disease.  Mother is 54 years old  with high cholesterol.   REVIEW OF SYSTEMS:  Positive for high blood pressure, anxiety,  depression, back pain, and leg pain.   PHYSICAL EXAMINATION:  HEAD, EARS, NOSE, AND THROAT:  Normal.  NECK:  Normal.  LUNGS:  There is some mild rhonchi bilaterally.  CARDIOVASCULAR:  Normal.  ABDOMEN:  Normal.  EXTREMITIES:  Grade 1 edema.  NEURO:  Mental status; normal.  Cranial nerves; normal.  strength showed  she has some weakness with dorsiflexion of both feet.  Straight leg  raising, SLR is positive about 60 degrees.  Femoral stretch  maneuver is  highly positive bilaterally.   The x-ray showed that she has spondylolisthesis at the level of L3-4  with severe degenerative disk disease at the level of L4-5 and L5-S1.   CLINICAL IMPRESSION:  Lumbar stenosis.  Neurogenic claudication.  Degenerative disk disease.  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   RECOMMENDATIONS:  The patient is being admitted for surgery.  The  procedure will be bilateral L3-4 and L4-5 laminectomy, decompression of  the thecal sac, foraminotomy, interbody fusion with pedicle screws and  cages, also  posterolateral arthrodesis with BMP and autograft.  The patient knows  about the risk with the surgery such as no improvement whatsoever  because of the chronicity of pain, infection, CSF leak, need for further  surgery, and failure of the fusion.  The patient declined more opinion.           ______________________________  Hilda Lias, M.D.  EB/MEDQ  D:  04/18/2009  T:  04/18/2009  Job:  [id number]

## 2011-04-27 NOTE — Assessment & Plan Note (Signed)
Ms. Lague follows up today.  She has main complaint of back pain as well  as hip pain.  She has had partial relief with right trochanteric bursa  injection, which I performed about 3 months ago.  She followed couple  weeks after the injection and it seemed to not work any more.  She has  had no new medical problems.  She is a diabetic, diet controlled.  She  does not have any significant foot pain or burning pain in the feet.   MEDICATIONS INCLUDE:  1. Lyrica 225 p.o. b.i.d.  2. Oxycodone 10/325 two p.o. t.i.d.  She states that these do not work      as well as they used to.   Her pain is around a 7/10, which is stable compared to last visit.  Her  sleep is fair.  Her pain improves with rest, heat, and medication.  Relief from meds is good, but does wear off it per her report.  She  could walk 10 minutes at a time.  She climbs steps.  She drives.  She  needs assistance with meal prep, household duties, and shopping.   REVIEW OF SYSTEMS:  She has numbness, tingling, trouble walking,  confusion, depression, and anxiety.  She was restarted on Cymbalta by  her primary care physician yesterday.  She has had blood pressure  regulation problems as well as urinary problems at times.   PHYSICAL EXAMINATION:  VITAL SIGNS:  Her blood pressure is 149/72, pulse  77, respiratory rate is 18, and O2 saturation 90% on room air.   Oswestry disability scale is 52%.   Her motor strength is 5/5 bilateral upper and lower extremities.  Range  of motion is normal bilateral upper and lower extremities.  She has some  limitation in hip and internal rotation bilateral lower extremities.  Knees, ankles, and joints are normal.  Extremities are without edema.  Coordination is normal upper and lower extremities.  Reflexes are  reduced bilateral knees and ankles.   Lumbar spine range of motion is 50% forward flexion and 50% extension.   IMPRESSION:  1. Lumbar spinal stenosis.  2. Hip pain, which is likely  spine related, although she does have      some reduction range of motion in internal rotation, which may      suggest osteoarthritis of the hips as well.   PLAN:  1. We will check urine drug screen.  Given that she states her      medicines are not working quite as well, we will make sure she is      compliant with her current program.  2. I do not think she would benefit from any type of spine or hip      injections, really has had minimal improvement.  I have recommended      that she does a regular walking program and keep up with her tensor      fascia lata stretching exercises.  3. I will reassess in one month to determine whether or not any      medication changes are needed.  We will review x-rays in the past.      She has had knee x-ray, but no hip x-ray.  If hip pain progresses,      we would x-ray the hips.     [name], M.D.  Electronically Signed    AEK/MedQ  D:  10/03/2008 10:09:03  T:  10/04/2008 00:30:03  Job #:  [id number]

## 2011-04-27 NOTE — Assessment & Plan Note (Signed)
Ms. Beam follows up today with main complaint of back pain as well as  hip pain, partial relief with right trochanteric bursa injection in the  past.  This has continued to help her.   When I last saw her, she was requesting increased pain medications,  given that she feels like she is more or less gotten used to her  oxycodone.  Her urine drug screen was negative for any illicit drugs or  non-disclosed opiates.  Certainly, I could not tell whether or not she  had been receiving additional oxycodone, but overall things looked okay.   Other meds include Lyrica 225 mg b.i.d.   Her pain level is 7/10, which is about what it was last time.   It is described as sharp, intermittent, stabbing, tingling, aching.  Improving with rest, heat, and medications.  TENS is not helpful.  Relief from meds is fair.  She can walk 10 minutes at a time and climb  steps without much trouble.  She sometimes uses a cane, but not  currently.  She does drive.  She needs some assist with household duties  and shopping, but otherwise independent with her self care.   REVIEW OF SYSTEMS:  Positive for numbness, tingling, trouble walking,  confusion, depression, anxiety, but negative for suicidal thoughts.  She  has had no signs of aberrant drug behavior.   SOCIAL HISTORY:  Lives with her husband and son.   Her Oswestry disability score today is 44% putting her in the moderate  range.   PHYSICAL EXAMINATION:  MUSCULOSKELETAL:  Strength is 5/5 in bilateral  upper and lower extremities.  Range of motion, normal bilateral upper  and lower extremities with exception of hip internal and external  rotation.  She does have crepitus at the left knee.  She has valgus  deformity at the left knee, noted today with ambulation.  Lumbar spine  range of motion 50% forward flexion, 50% extension.  Left knee has no  evidence of effusion.  Her hips have no tenderness to palpation over the  troch bursa.   IMPRESSION:  1.  Lumbar spinal stenosis.  2. Hip pain, left side.  I believe that this may be osteoarthritis      given the range of motion limitations, although she did have some      element of trochanteric bursitis at one point.  In addition, she      has left knee crepitation as well as valgus deformity.  She has had      orthoscopic surgery on the right knee from Dr. Chaney Malling.  I will      send her back to him, have him evaluate both the left knee and      perhaps the left hip.  Certainly gait alteration may add to her low      back pain as well.   PLAN:  We will go ahead and increase her oxycodone to 10/325 mg two p.o.  q.i.d.  We will have [place] check on her meds to make sure she  does not have multiple providers.   I will see her back in about a months' time.      [name], M.D.  Electronically Signed     AEK/MedQ  D:  11/01/2008 16:13:38  T:  11/02/2008 04:27:22  Job #:  [id number]   cc:   Burnell Blanks, MD  Fax: [phone]   Lenard Galloway. Chaney Malling, M.D.  Fax: [phone]

## 2011-04-27 NOTE — Op Note (Signed)
[name], [name]                ACCOUNT NO.:  [id number]   MEDICAL RECORD NO.:  [id number]          PATIENT TYPE:  INP   LOCATION:  3029                         FACILITY:  [facility]   PHYSICIAN:  Hilda Lias, M.D.   DATE OF BIRTH:  05/27/1956   DATE OF PROCEDURE:  04/18/2009  DATE OF DISCHARGE:                               OPERATIVE REPORT   PREOPERATIVE DIAGNOSES:  L3-L4 spondylolisthesis with stenosis,  degenerative disk disease L4-5, L5-S1.   POSTOPERATIVE DIAGNOSES:  L3-L4 spondylolisthesis with stenosis,  degenerative disk disease L4-5, L5-S1.   PROCEDURE:  Bilateral L3, L4, L5 laminectomy, bilateral facetectomy,  bilateral L3-4 diskectomy, interbody fusion with the cages 10 x 22 at L3-  4, pedicle screws L3-L4, L5-S1, posterolateral arthrodesis with  autograft and BMP from L3-S1, Cell Saver, C-arm view.   SURGEON:  Hilda Lias, MD   ASSISTANT:  Danae Orleans. Venetia Maxon, M.D.   CLINICAL HISTORY:  [name] is a lady who had been followed in my  office for more than 3 years complained of back pain worsened to both  legs.  Lately, the pain is getting worse up to at the point that walking  less than a block.  She had to sit because of the pain.  X-rays showed  that she had stenosis and spondylolisthesis at the level of L3-4,  degenerative disk disease L4-5, L5-S1.  Surgery was advised.   PROCEDURE IN DETAIL:  The patient was taken to the OR and after  intubation she was positioned prone manner.  Midline incision from L3  down to L5-S1 was made and muscle and fascia were retracted all the way  laterally until we were able to see the transverse process of L3, L4,  and L5.  Then we followed with surgery.  X-rays showed that we were at  the level L4-5.  From then on we started with laminectomy L4-5.  At the  level L3-4, the area was quite narrow.  There was a large calcified  yellow ligament produces severe stenosis.  We had to do with  microdissector, we were able to decompress  the thecal sac and do  bilateral laminectomy with decompression of the thecal sac as well as  the nerve root.  The patient had quite a bit of a herniated disk worse  in left than the right side.  Incision was made.  We had to do a total  facetectomy to be able to do a completely diskectomy.  Once we removed  the endplates, we introduced two cages of 10 x 22 bilaterally.  Then, we  decompressed the L4-5 space as well as the S1.  There was almost no  space and because of that we decided not to do any diskectomy.  Then  using the C-arm, we probed the pedicles L3, L4, L5, and S1.  We  introduced a total of eight screws.  Prior to introduction, we were able  to feel the __________ that we were surrounded by bone.  The pedicle  screws were 6.5 and from 50 at the level of S1 length, 40 at the level  of  L5, and 45 at the level of L3 and L4.  There were corrected with a  rod and caps.  Then, a Crosslink was used from right to left.  With the  drill, we were able to remove the periosteum lateral facet of L3-4, L4-  5, L5-S1 as well as the transverse process.  Then, a mix of BMP and  autograft was used for arthrodesis.  Valsalva maneuver was negative.  The area was irrigated.  Then using Vicryl and Steri-Strips, the area  was closed.          ______________________________  Hilda Lias, M.D.    EB/MEDQ  D:  04/18/2009  T:  04/19/2009  Job:  [id number]

## 2011-04-27 NOTE — Procedures (Signed)
[name], [name]                ACCOUNT NO.:  [id number]   MEDICAL RECORD NO.:  [id number]           PATIENT TYPE:   LOCATION:                                 FACILITY:   PHYSICIAN:  [name], M.D.DATE OF BIRTH:  15-Nov-1956   DATE OF PROCEDURE:  DATE OF DISCHARGE:                               OPERATIVE REPORT   PROCEDURE:  Bilateral S1 transforaminal injections.   INDICATIONS:  Lumbar spinal stenosis with S1 radiculopathy.   She is scheduled for surgery in May, however, has severe pain and  decreased mobility despite narcotic analgesic medications.   Informed consent was obtained after describing risks and benefits of the  procedure with the patient.  These include bleeding, bruising,  infection.  She elects to proceed and has given written consent.  The  patient placed prone on fluoroscopy table.  Betadine prep, sterile  drape.  A 25-gauge inch and a half needle was used to anesthetize the  skin and subcu tissue, 1% lidocaine x2 mL.  Then, a 22-gauge 3-1/2-inch  spinal needle was inserted first and inserted under fluoroscopic  guidance to the left S1 foramen, AP, lateral, and oblique images  utilized.  Omnipaque 180 under live fluoro demonstrated no intravascular  uptake.  Then, 0.5 mL of Omnipaque 180 showed no intravascular uptake.  Then, a solution containing 1 mL of 10 mg/mL dexamethasone, 1.5 mL of 1%  MPF lidocaine was injected.  This same procedure was repeated on the  right side using same needle injectate and technique.  The patient  tolerated the procedure well.  Pre and postinjection vitals stable.  Postinjection instructions given.  I have written for oxycodone 2-week  supply until surgery.  We will cut it down to 2 p.o. t.i.d.  We  discussed the problems of both postoperative analgesia with somebody  chronic high-dose narcotic analgesics.      [name], M.D.  Electronically Signed     AEK/MEDQ  D:  04/03/2009 10:55:54  T:   04/03/2009 23:53:53  Job:  [id number]   cc:   Hilda Lias, M.D.  Fax: [phone]

## 2011-04-27 NOTE — Assessment & Plan Note (Signed)
A 55 year old female that I last saw January 30, 2009.  She has had  increasing back pain with some progressive radicular symptomatology  feeling like some numbness on the bottom foot as well as all side of her  toes.  She has no bowel or bladder dysfunction.  She has walking  tolerance of about 10 minutes.  She climbs steps but it bothers her.  She uses a cane at times.  She is independent with all her self-care,  but needs some help with meal prep, household duties, and shopping.   REVIEW OF SYSTEMS:  Positive for numbness and tingling in bilateral  feet, trouble walking distance, confusion, depression, and anxiety.   MRI performed December 16, 2008, showed severe spinal stenosis at L3-L4  level due to facet-mediated slip, severe posterior element hypertrophy,  mild annular bulge.  She had moderate severe stenosis at L4-L5,  compression of thecal sac bilateral L5 nerve roots, at L5-S1 shallow  protrusion, bilateral S1 nerve root encroachment, left greater than  right.   She has been referred to Dr. Jeral Fruit and has an appointment in about 2  weeks.   PHYSICAL EXAMINATION:  She has absent reflex bilateral knees, but has  good strength bilateral quads.  She has 1+ reflex in the ankle.  Sensation is decreased along the S1 dermatome bilaterally.  Her gait  shows no evidence of toe drag or knee instability.   IMPRESSION:  Lumbar spinal stenosis.  Symptomatically, she has  multilevel radiculopathy, although it seems most problematic at S1.  We  will set up for S1 transforaminal while she awaits her appointment at  [facility].  We will give her a Medrol Dosepak while she waits her  appointment for the S1 transforaminal.  We will continue her oxycodone  10/325 two p.o. q.i.d. and we will continue Lyrica 225 b.i.d.      [name], M.D.  Electronically Signed     AEK/MedQ  D:  02/28/2009 14:57:59  T:  03/01/2009 04:12:49  Job #:  [id number]   cc:   Hilda Lias, M.D.  Fax:  [phone]

## 2011-04-27 NOTE — Assessment & Plan Note (Signed)
[name] follows up today. She had about 2 weeks of good relief from  the right trochanteric bursa injection. She has had previous right  sacroiliac injection as well as lumbar medial branch blocks which were  not helpful. Her pain interferes with meal prep, household duties and  shopping. Her average pain is 7/10 and currently 6 described as  intermittent, dull, stabbing, tingling, aching. Sleep is fair. The pain  is worse with walking, bending, inactivity standing. Improves with rest  and medication. She has confusion, depression, anxiety but negative for  suicidal thoughts.   CURRENT MEDICATIONS:  1. Oxycodone 10/325 one p.o. q.4 h up to 6 a day, last written March 21, 2008.  2. Lyrica 150 t.i.d. She is wondering if that can be increased a bit.   PHYSICAL EXAMINATION:  She has no tenderness to palpation over the  greater trochanters. She has lower extremity strength 5/5 hip flexion,  knee extension, ankle dorsiflexors. Deep tendon reflexes are 2+ in the  bilateral knees and ankles. Hip range of motion, knee and range of  motion are normal.   IMPRESSION:  1. Left sacroiliac pain.  2. Right trochanteric bursitis.   PLAN:  1. No further injections are needed at this time.  2. Refer to physical therapy for stretching ITB, stretching and      strengthening right hip. I will see her back in 1 month. Will      continue current medications.      [name], M.D.  Electronically Signed     AEK/MedQ  D:  04/09/2008 14:03:53  T:  04/09/2008 14:36:05  Job #:  [id number]   cc:   Donalee Citrin, M.D.  Fax: [phone]

## 2011-04-27 NOTE — Assessment & Plan Note (Signed)
Ms. Shippey follows up today.  She had a referal to Physical Therapy, but  has only attended it one time so far.  She states that in the interval  time period, she has had to take her husband to the hospital for  cholecystectomy.  She has had good relief from prior right trochanteric  bursa injection.  She has had no significant improvement after left  sacroiliac or lumbar facet injections.  Her pain level is averaging 6-7,  which is stable.  She is independent with all her self-care.  She  indicates some need for assistance with meal preparation, household  duties, and shopping.  She walks 15 minutes at a time.  She climbs  steps.  She drives.  She sometimes uses a cane.  Her pain improves with  rest, heat, pacing activities, medication.  The pain is described as  sharp, intermittent, stabbing, tingling, and aching.  Pain interferes  with general activity, relationshipwith others and enjoyment of life.   REVIEW OF SYSTEMS:  Positive for numbness, tingling, confusion,  depression, anxiety, trouble walking, and weakness in her legs at times.   CURRENT MEDICATIONS:  1. Lyrica now at 225 b.i.d.  2. Oxycodone 10/325 q.4 h. up to 6 per day.   PHYSICAL EXAMINATION:  Her extremities show 1+ edema at bilateral  pretibial and coordination normal.  Deep tendon reflexes are normal.  Sensation normal.  Orientation x3.  Her spine range of motion is about  50% forward flexion, 50% extension.  Hip, knee, and ankle range of  motion are normal.  Strength is 5/5 bilateral hip flexion, knee  extension, ankle dorsiflexion.   IMPRESSION:  1. Right hip trochanteric bursitis, chronic.  2. Lumbar pain, chronic.  3. She has spinal stenosis, generally short pedicles, multilevel disk      disease at L3-L4, L4-L5, and L5-S1.   PLAN:  Continue  current plan; continue physical therapy.  I will see  her back in about 2 months.  Nursing visit at 1 month.  She should have  completed her physical therapy by that  time.      [name], M.D.  Electronically Signed     AEK/MedQ  D:  05/07/2008 11:45:31  T:  05/08/2008 01:44:52  Job #:  [id number]

## 2011-04-27 NOTE — Assessment & Plan Note (Signed)
PROCEDURE:  This is a right trochanteric bursa injection.   INDICATIONS:  Right trochanteric bursitis, only partially relieved with  medication management and interfering with meal prep, household duty,  shopping, and walking.  Also interferes with sleep.   Prior relief with right troch bursa injection in March 2009 has worn  off.  She has tried physical therapy.   Informed consent was obtained after describing risks and benefits of the  procedure to the patient.  These include bleeding, bruising, infection,  as well as elevation of blood sugars.  She elected to proceed.  Solution  containing 0.5 mL of 40 mg Depo-Medrol and 5 mL of 1% lidocaine were  mixed, area marked, prepped with Betadine over the right trochanteric  bursa using a 21-gauge 2-inch needle, bone contact made.  Negative  drawback for blood of the injection solution.  The patient tolerated the  procedure well.  Post injection instructions were given.      [name], M.D.  Electronically Signed     AEK/MedQ  D:  07/09/2008 14:07:16  T:  07/10/2008 07:56:19  Job #:  [id number]

## 2011-04-27 NOTE — Assessment & Plan Note (Signed)
REASON FOR VISIT:  Today worsening of back pain as well as right lower  extremity pain.  Followup from MRI.   A 55 year old female who over the last year has been on increased  dosages of narcotic analgesics because of her back pain.  She is up to  Percocet 10/325 two p.o. q.i.d. #240 per month.  Approximately a year  ago, she was on 180 per month.   She is feeling like she has got some weakness in the right leg, but she  has had no falls.  Her average pain is 5/10, but 7/10 on average.  Her  Oswestry disability score today is 54%.   Pain does interfere with meal prep, household duties, and shopping.   REVIEW OF SYSTEMS:  Positive for numbness, tingling, confusion,  depression, anxiety, blood sugar problems.   SOCIAL HISTORY:  Currently lives with husband and son.  She is getting a  divorce, however.  Her son has been in the hospital with complications  resulting from cystic fibrosis.   PHYSICAL EXAMINATION:  In general, in no acute distress.  Mood and  affect appropriate.  Gait no evidence of toe drag or knee instability.  Her back has tenderness to palpation in the lumbar paraspinals, but this  is not severe.  Lower extremity strength is normal, bilateral hip  flexor, knee extension, ankle dorsiflexion, great toe extensor.  She has  1/5 reflex in bilateral knees and ankles.   MRI compared with prior exam on July 11, 2006, showed some increased  slip L3 on L4 with severe stenosis at that level in her anterolisthesis  is due to lumbar facet degenerative changes.   IMPRESSION:  Lumbar spinal stenosis progressive with radicular  symptomatology.  She does have neurogenic claudication symptoms now.  She can walk about 10 minutes at a time, but she has pain onset prior to  that time.   PLAN:  We will go ahead and send her back to Dr. Jeral Fruit for surgical  evaluation.  We will continue on current Percocet 10/325 two p.o. q.i.d.  and Lyrica 225 b.i.d.      [name],  M.D.  Electronically Signed     AEK/MedQ  D:  01/30/2009 16:00:32  T:  01/31/2009 04:05:53  Job #:  [id number]   cc:   Hilda Lias, M.D.  Fax: [phone]

## 2011-04-27 NOTE — Procedures (Signed)
[name], [name]                ACCOUNT NO.:  [id number]   MEDICAL RECORD NO.:  [id number]          PATIENT TYPE:  REC   LOCATION:  [facility]                          FACILITY:  [facility]   PHYSICIAN:  [name], M.D.DATE OF BIRTH:  1956-02-26   DATE OF PROCEDURE:  07/24/2007  DATE OF DISCHARGE:                               OPERATIVE REPORT   PROCEDURE:  Bilateral S1 transforaminal epidural steroid injection under  fluoroscopic guidance.   INDICATIONS:  Lumbar radiculitis secondary to lumbar stenosis.   Informed consent was obtained after describing risks and benefits of the  procedure to the patient.  These include bleeding, bruising, infection,  loss of bowel and bladder function, temporary or permanent paralysis.  She elected to proceed and has given written consent.   The patient was placed prone on the fluoroscopy table.  Betadine prep,  sterile drape.  A 25 gauge 1 1/2 inch needle was used to anesthetize the  skin and subcu tissue with 1% lidocaine x2 mL at each of two sites.  Then, a 22 gauge 3 1/2 inch spinal needle was inserted first in the left  S1 foramen under AP and lateral imaging.  Omnipaque 180 x 0.5 mL  demonstrated no intravascular uptake and good nerve outline followed by  injection of 2.5 mL of a solution containing 2 mL of 40 mg per mL Depo-  Medrol and 3 mL of 1% lidocaine.  The same procedure was repeated on the  right side with the same technique and injectate.  The patient tolerated  the procedure well.  Pre injection pain was 8/10, post injection 0/10.  Post injection instructions were given.  We will see her back in one  month for possible reinjection.      [name], M.D.  Electronically Signed     AEK/MEDQ  D:  07/24/2007 17:10:15  T:  07/25/2007 19:45:32  Job:  [id number]

## 2011-04-27 NOTE — Assessment & Plan Note (Signed)
[name] follows up from bilateral L5 dorsal ramus injections,  bilateral L4 medial branch block, bilateral L3 medial branch block under  fluoroscopic guidance.  Her pre-injection pain level was 8/10.  The day  following injection, pain levels were in the 3/10 to 4/10, but towards  the evening went back up to the 5/10 range and the next day was up in  the 8/10 to 9/10 range.   She has continued on her MS-Contin, but feels like this has not provided  as good a relief as her oxycodone.  She was taking 10 mg oxycodone and  is taking 15 of the MS-CR, and taking a total of 45 mg a day.   Average pain 6/10, current time, sleep is fair.  Night time pain and  first thing in the morning pain is worse.  It is worse with walking,  bending, sitting, standing.  Improves at rest.  She can walk 10 minutes  at a time.  She has difficulty with meal prep, household duties, and  shopping.   REVIEW OF SYSTEMS:  Positive for trouble walking, confusion, and  anxiety.  Also positive for constipation, limb swelling right greater  than left.   INTERVAL HISTORY:  Has had a fall and hurt her right knee.   EXAMINATION:  Blood pressure 201/108.  This was rechecked and the  patient has been instructed to call her primary care physician and she  states she will definitely do this today.  Right knee has no swelling or effusion.  There is some tenderness over  the tibial tubercle.  No pain over the patellar tendon or with resisted  knee extension.  No medial or lateral instability.  She does have some  edema of the right lower extremity.  There is no erythema.  No  tenderness in the right calf.  Left lower extremity has no tenderness over the knee.  Her back has  tenderness in the lumbosacral junction, pain with extension and twisting  toward the right side, right greater than left.  Lower extremity range  of motion is normal at hips, knees, and ankles.   IMPRESSION:  1. Lumbar facet syndrome.  2. Right knee  contusion.  Given that it has been several weeks and      still has some point tenderness over the tibial tubercle, as well      as some more diffuse swelling in the lower extremity, will check a      knee x-ray of the proximal tibia.   PLAN:  1. I will see her back.  Will repeat medial branch block.  2. Will switch from MS-Contin to oxycodone, her last urine drug screen      on November 11 was appropriate for medications taken.  No illicit      drugs or non-reported opiates.      [name], M.D.  Electronically Signed     AEK/MedQ  D:  11/20/2007 13:39:49  T:  11/20/2007 16:09:44  Job #:  [id number]   cc:   Rachel Blanks, MD  Fax: [phone]   [name], M.D.  Fax: [phone]

## 2011-04-27 NOTE — Procedures (Signed)
[name], [name]                ACCOUNT NO.:  [id number]   MEDICAL RECORD NO.:  [id number]          PATIENT TYPE:  REC   LOCATION:  [facility]                          FACILITY:  [facility]   PHYSICIAN:  [name], M.D.DATE OF BIRTH:  June 05, 1956   DATE OF PROCEDURE:  03/11/2008  DATE OF DISCHARGE:                               OPERATIVE REPORT   PROCEDURE:  Right trochanteric bursa injection.   INDICATION:  Right hip pain that did not respond to sacroiliac  injection.  Similarly, she has had no significant response with lumbar  medial branch blocks.   Pain is interfering with activities such as meal prep, household duties,  shopping, and persists despite oxycodone   PROCEDURE IN DETAIL:  Informed consent was obtained.  After describing  the risks and benefits of procedure to the patient, which include  bleeding, bruising, infection, she elected to proceed and gave written  consent.  The patient was placed in the left lateral decubitus position.  The area was marked prepped with Betadine and entered with a 25-gauge 3-  inch spinal needle with stylette taken out.  After bone contact was  made, a solution containing 1 mL of 41 mL of Depo-Medrol 4 mL of 1%  lidocaine was injected.  The patient tolerated the procedure well.  Post  injection instructions were given.   Post injection, she has some pain relief and still has some buttock  soreness, but lateral hip pain has improved.   I will see her back in one month for regular followup visit.  She will  be seeing Dr. Wynetta Emery from surgery as well.      [name], M.D.  Electronically Signed     AEK/MEDQ  D:  03/11/2008 09:43:56  T:  03/11/2008 11:42:24  Job:  [id number]   cc:   Donalee Citrin, M.D.  Fax: [phone]

## 2011-04-27 NOTE — Assessment & Plan Note (Signed)
HISTORY:  The patient follows up today for bilateral sacroiliac  injection under fluoroscopic guidance performed on February 08, 2008.  She had good results on the left side for about eight days and none on  the right side.  She did not have any significant improvement.  She has  had no improvement in the past with facet injections.  Averages her pain  is in the 6-7/10 range.  Her sleep is fair.  The pain refers with  activity at a 10/10 level.  The pain is worse with walking, sitting,  inactivity, standing and some other activities.  Improves with rest,  heat and medication.  She can walk 15 minutes at a time.  She climbs  steps.  She drives.  She needs some assistance with household duties and  shopping.   REVIEW OF SYSTEMS:  She has trouble walking.  Confusion, depression,  anxiety, but negative for suicidal thoughts.  She has had no signs of  aberrant drug behavior.   CURRENT MEDICATIONS:  1. Oxycodone 10/325 mg, two p.o. three times daily, #180.  2. Lyrica 150 mg three times daily.   PHYSICAL EXAMINATION:  VITAL SIGNS:  Blood pressure 126/77, pulse 98,  respirations 18, saturation 96% on room air.  GENERAL:  Mood and affect are appropriate.  BACK:  A little tenderness to palpation except down at the PSIS area.  NEUROLOGIC:  She has positive Faber's maneuver in the right SI area but  not in the left side.  Lower extremity strength is 5/5 in the hip  flexion and knee extensor, dorsiflexion.  Deep tendon reflexes 2+  bilateral knees and ankles.  Hip range of motion, internal and external  rotation is full.  Knee and ankle range of motion normal.   IMPRESSION/PLAN:  Left sacroiliac pain:  Suspect that she has right-  sided pain, but did not respond well to injection.  Will repeat in one  to two weeks.  In the meantime will continue on oxycodone 10/325 mg, two  p.o. three times daily.  Continue Lyrica 150 mg three times daily.   FOLLOWUP:  I will see her back for the  injection.      [name], M.D.  Electronically Signed     AEK/MedQ  D:  02/27/2008 14:33:08  T:  02/27/2008 16:47:08  Job #:  [id number]   cc:   Burnell Blanks, MD  Fax: [phone]

## 2011-04-27 NOTE — Procedures (Signed)
Rachel, Harrington                ACCOUNT NO.:  [id number]   MEDICAL RECORD NO.:  [id number]          PATIENT TYPE:  REC   LOCATION:  [facility]                          FACILITY:  [facility]   PHYSICIAN:  [name], M.D.DATE OF BIRTH:  12-21-1955   DATE OF PROCEDURE:  09/11/2007  DATE OF DISCHARGE:                               OPERATIVE REPORT   PROCEDURE:  Bilateral L5 transforaminal lumbar epidural steroid  injection under fluoroscopic guidance.   INDICATIONS:  Lumbar S1 radiculopathy, previously improved by epidural  steroid injection approximately 2 months ago.   INFORMED CONSENT:  Obtained after describing the risks and benefits of  procedure; These include bleeding, bruising, infection, loss of bladder  or bowel function, temporary or permanent paralysis, she elects proceed,  and has given her consent.   DESCRIPTION OF PROCEDURE:  The patient placed prone on fluoroscopy  table.  Betadine prep and sterile drape with a 5-gauge, 1-2-inch needle.  Using to anesthetize skin and subcu tissue 1% lidocaine x2 mL and a 22-  gauge 3-1/2-inch spinal needle was inserted first into the left S1  foramina.  AP lateral imaging is utilized.  Omnipaque 180 x 4-5 mL  demonstrated no intravascular uptake.  Then solution containing 1 mL of  40 mg/mL Depo-Medrol and 2 mL of 1% lidocaine were injected.   The same procedure was repeated on the right side using same technique,  and same equipment.   The patient tolerated the procedure well.  Pre-and-post injection vitals  stable.      [name], M.D.  Electronically Signed     AEK/MEDQ  D:  09/11/2007 11:26:22  T:  09/11/2007 12:14:46  Job:  [id number]
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
## 2011-04-27 NOTE — Procedures (Signed)
[name], [name]                ACCOUNT NO.:  [id number]   MEDICAL RECORD NO.:  [id number]          PATIENT TYPE:  REC   LOCATION:  [facility]                          FACILITY:  [facility]   PHYSICIAN:  [name], M.D.DATE OF BIRTH:  1956/08/09   DATE OF PROCEDURE:  12/25/2007  DATE OF DISCHARGE:                               OPERATIVE REPORT   PROCEDURE:  Bilateral L5 dorsal ramus injection, bilateral L4 medial  branch block, bilateral L3 medial branch block under fluoroscopic  guidance.   INDICATIONS:  Lumbar facet syndrome.  She has had previous relief of 50%  following injection of corresponding levels performed October 23, 2007.   The pain is only partially response to narcotic analgesic medications  and interferes with self-care and mobility.   Informed consent was obtained at describing risks and benefits to the  patient.  These include bleeding, bruising, infection, temporary or  permanent paralysis.  She elects to proceed and has given written  consent.  The patient placed prone on fluoroscopy table.  Betadine prep,  sterile drape, 25-gauge 1-1/2 inch needle was used to anesthetize skin  and subcu tissue with 1% lidocaine x2 mL and a 22 gauge 5 inch spinal  needle was inserted under fluoroscopic guidance first targeting left S1  SAP sacral ala junction, bone contact made confirmed with lateral  imaging.  Omnipaque 180 x 0.5 mL demonstrated no intravascular uptake  and 0.5 mL of a solution containing 1 mL of 4 mg/mL dexamethasone and 2  mL of 2% MPF lidocaine was injected.  The left L5 SAP transverse process  junction targeted bone contact made with confirmed lateral imaging.  Omnipaque 180 x 0.5 mL demonstrated no intravascular uptake then 0.5 mL  of dexamethasone lidocaine solution was injected.  Then left L4 SAP  transverse process junction targeted, bone contact made confirmed with  lateral imaging.  Omnipaque 180 x 0.5 mL demonstrated no intravascular  uptake, then  0.5 mL of dexamethasone lidocaine solution injected.  The  patient tolerated procedure well.  Pre and post injection levels 7/10.  Will give her injection data sheet and she will record her pain over the  next 72 hours.  Office follow-up in 1 month and see how she did with  this.      [name], M.D.  Electronically Signed     AEK/MEDQ  D:  12/25/2007 11:12:05  T:  12/25/2007 14:03:13  Job:  [id number]

## 2011-04-27 NOTE — Assessment & Plan Note (Signed)
Ms. Kasparian returns today.  I last saw her on 12/25/2007.  Bilateral L5  dorsal ramus injection, bilateral L4 medial branch, bilateral L3 medial  branch block under fluoroscopic guidance.  This was done because the  previous set of injections done on November 10 of 08 had a 50% relief of  pain.  This last time, it basically went from 7 to a 5.   She has had no other problems in the interval time.  Her last UDS was  done 10/23/07 which was normal.   Her average pain is 5 out of 10 mainly in her back, buttocks area.  She  can walk 10 to 15 minutes at a time, climbs steps, she drives.  She has  some pain limiting meal prep, household duties, shopping.  She is on  disability.  She has trouble walking, spasms and depression  On her review of systems.   PHYSICAL EXAMINATION:  VITAL SIGNS:  Blood pressure 170/97.  She states  that it is always high when we check it here, but she had it checked at  her doctor's office which was normal.  Our largest cuff here is an adult  large.  The patient is large.  Her pulse is 80, respirations 20, O2 sat  91% on room air.  GENERAL:  No acute distress.  BACK:  Tenderness of the PSIS bilaterally.  There is positive Faber  maneuver bilaterally.  LOWER EXTREMITIES:  Have no edema.  She has normal sensation, normal  range of motion.   IMPRESSION:  1. Lumbar spinal stenosis.  2. Questionable lumbar facet syndrome.  3. Sacroiliac disorder.   PLAN:  Will do a sacroiliac injection, and I will see her back in a  couple of weeks for this.  Continue current meds.  Check UDS next visit.      [name], M.D.  Electronically Signed     AEK/MedQ  D:  01/23/2008 13:31:27  T:  01/24/2008 23:42:14  Job #:  [id number]

## 2011-04-30 NOTE — Assessment & Plan Note (Signed)
[name] returns today.  She is a 55 year old female with lumbar  spinal stenosis, S1 radiculopathies.  She underwent L3 through S1 fusion  by Dr. Jeral Fruit in May 2010.  Her sciatic-type symptoms have improved, did  have some numbness postoperatively.  She has had the addition of some  Neurontin postoperatively.  She is about close to 4 months postop.  She  walks 10 minutes at a time.  She climbs steps.  She drives.  She needs  some help with household duties and shopping but is otherwise  independent.  She started some physical therapy postop, however, had to  quit due to transportation issues.  She has a stationary bike that she  can use but she is afraid to use due to her knees.   Her average pain is in the 5/10 range with medications.  Her relief from  meds is fair to good.   Her review of systems is positive for weakness, numbness, tingling,  depression, anxiety, diarrhea, constipation.   SOCIAL HISTORY:  Married, lives with her husband and son.   PHYSICAL EXAMINATION:  VITAL SIGNS:  Blood pressure 104/58, pulse 62,  respirations 18, O2 sat 92% on room air.  GENERAL:  Overweight female in no acute stress.  Orientation x3.  Affect  alert.  MUSCULOSKELETAL:  Gait is with a cane.  Her knee range of motion is  full.  She has no evidence of effusion.  She has reduced deep tendon  reflexes in bilateral knees and ankles.  She has normal strength.  She  wears LSO since her surgery.  Gait shows no evidence of toe drag or knee  instability.   IMPRESSION:  1. Lumbar postlaminectomy syndrome.  2. Radiculitis neuritis, improved.  We will wean Neurontin to b.i.d.      per week and then daily per week and then discontinue.  She is on      Lyrica already q.25 b.i.d., she can continue that  3. In terms of her narcotic analgesics, Dr. Jeral Fruit has been      prescribing postoperatively, we will resume this.  First check      urine drug screen.  She should have about another week's worth  of      medication left and that should give [place] time to analyze results and      resume 10 mg 5 times a day of the Percocet should her urine drug      screen to check out fine.  I will see her back in 3 months, nursing      visit 1 month.      [name], M.D.  Electronically Signed     [facility]  D:  08/19/2009 09:19:41  T:  08/20/2009 00:53:41  Job #:  [id number]   cc:   [name], M.D.  Fax: [phone]

## 2011-05-05 ENCOUNTER — Encounter: Payer: Self-pay | Admitting: Critical Care Medicine

## 2011-07-08 ENCOUNTER — Inpatient Hospital Stay (HOSPITAL_COMMUNITY): Admission: RE | Admit: 2011-07-08 | Payer: Medicare Other | Source: Ambulatory Visit | Admitting: Orthopedic Surgery

## 2011-07-20 ENCOUNTER — Other Ambulatory Visit: Payer: Self-pay | Admitting: Obstetrics and Gynecology

## 2011-07-20 DIAGNOSIS — Z1231 Encounter for screening mammogram for malignant neoplasm of breast: Secondary | ICD-10-CM

## 2011-07-27 ENCOUNTER — Ambulatory Visit: Payer: Medicare Other

## 2011-07-28 ENCOUNTER — Ambulatory Visit: Payer: Medicare Other

## 2011-08-12 ENCOUNTER — Other Ambulatory Visit: Payer: Self-pay | Admitting: Internal Medicine

## 2011-08-12 NOTE — Telephone Encounter (Signed)
Received refill request for generic Keppra. I denied this and sent back to [facility] with note to pharmacist to defer this to pt's PCP or neuro doc

## 2011-08-23 ENCOUNTER — Other Ambulatory Visit: Payer: Self-pay | Admitting: Orthopedic Surgery

## 2011-08-23 ENCOUNTER — Encounter (HOSPITAL_COMMUNITY): Payer: Medicare Other

## 2011-08-23 LAB — COMPREHENSIVE METABOLIC PANEL
AST: 18 U/L (ref 0–37)
BUN: 15 mg/dL (ref 6–23)
CO2: 33 mEq/L — ABNORMAL HIGH (ref 19–32)
Calcium: 10.3 mg/dL (ref 8.4–10.5)
Creatinine, Ser: 1.05 mg/dL (ref 0.50–1.10)
GFR calc Af Amer: >60 mL/min (ref >60)
GFR calc non Af Amer: 55 mL/min — ABNORMAL LOW (ref >60)
Glucose, Bld: 91 mg/dL (ref 70–99)

## 2011-08-23 LAB — CBC
Hemoglobin: 15.7 g/dL — ABNORMAL HIGH (ref 12.0–15.0)
MCH: 32 pg (ref 26.0–34.0)
MCHC: 34.4 g/dL (ref 30.0–36.0)

## 2011-08-23 LAB — DIFFERENTIAL
Basophils Relative: 1 % (ref 0–1)
Eosinophils Absolute: 0.1 10*3/uL (ref 0.0–0.7)
Monocytes Absolute: 0.5 10*3/uL (ref 0.1–1.0)
Monocytes Relative: 7 % (ref 3–12)

## 2011-08-23 LAB — PROTIME-INR
INR: 1.14 (ref 0.00–1.49)
Prothrombin Time: 14.8 seconds (ref 11.6–15.2)

## 2011-08-23 LAB — URINALYSIS, ROUTINE W REFLEX MICROSCOPIC
Bilirubin Urine: NEGATIVE
Glucose, UA: NEGATIVE mg/dL
Ketones, ur: NEGATIVE mg/dL
Leukocytes, UA: NEGATIVE
Protein, ur: NEGATIVE mg/dL

## 2011-08-23 LAB — APTT: aPTT: 39 seconds — ABNORMAL HIGH (ref 24–37)

## 2011-08-26 ENCOUNTER — Inpatient Hospital Stay (HOSPITAL_COMMUNITY)
Admission: RE | Admit: 2011-08-26 | Discharge: 2011-08-30 | DRG: 470 | Disposition: A | Discharge status: Home Health | Payer: Medicare Other | Source: Ambulatory Visit | Attending: Orthopedic Surgery | Admitting: Orthopedic Surgery

## 2011-08-26 DIAGNOSIS — M1710 Unilateral primary osteoarthritis, unspecified knee: Principal | ICD-10-CM | POA: Diagnosis present

## 2011-08-26 DIAGNOSIS — Z981 Arthrodesis status: Secondary | ICD-10-CM

## 2011-08-26 DIAGNOSIS — Z01812 Encounter for preprocedural laboratory examination: Secondary | ICD-10-CM

## 2011-08-26 DIAGNOSIS — I10 Essential (primary) hypertension: Secondary | ICD-10-CM | POA: Diagnosis present

## 2011-08-26 DIAGNOSIS — F17200 Nicotine dependence, unspecified, uncomplicated: Secondary | ICD-10-CM | POA: Diagnosis present

## 2011-08-26 DIAGNOSIS — D62 Acute posthemorrhagic anemia: Secondary | ICD-10-CM | POA: Diagnosis not present

## 2011-08-26 DIAGNOSIS — I959 Hypotension, unspecified: Secondary | ICD-10-CM | POA: Diagnosis not present

## 2011-08-26 HISTORY — PX: JOINT REPLACEMENT: SHX530

## 2011-08-26 LAB — MRSA CULTURE

## 2011-08-26 LAB — TYPE AND SCREEN: Antibody Screen: NEGATIVE

## 2011-08-27 LAB — BASIC METABOLIC PANEL
GFR calc Af Amer: >60 mL/min (ref >60)
GFR calc non Af Amer: >60 mL/min (ref >60)
Potassium: 3.3 mEq/L — ABNORMAL LOW (ref 3.5–5.1)
Sodium: 139 mEq/L (ref 135–145)

## 2011-08-27 LAB — GLUCOSE, CAPILLARY
Glucose-Capillary: 99 mg/dL (ref 70–99)
Glucose-Capillary: 129 mg/dL — ABNORMAL HIGH (ref 70–99)

## 2011-08-27 LAB — CBC
Hemoglobin: 12.8 g/dL (ref 12.0–15.0)
MCHC: 32.5 g/dL (ref 30.0–36.0)
RDW: 14 % (ref 11.5–15.5)

## 2011-08-28 DIAGNOSIS — M79609 Pain in unspecified limb: Secondary | ICD-10-CM

## 2011-08-28 LAB — CBC
MCH: 31.8 pg (ref 26.0–34.0)
MCV: 94.2 fL (ref 78.0–100.0)
Platelets: 181 10*3/uL (ref 150–400)
RDW: 13.9 % (ref 11.5–15.5)

## 2011-08-28 LAB — BASIC METABOLIC PANEL
CO2: 29 mEq/L (ref 19–32)
Calcium: 9.2 mg/dL (ref 8.4–10.5)
Creatinine, Ser: 0.85 mg/dL (ref 0.50–1.10)
GFR calc Af Amer: >60 mL/min (ref >60)

## 2011-08-28 LAB — GLUCOSE, CAPILLARY: Glucose-Capillary: 109 mg/dL — ABNORMAL HIGH (ref 70–99)

## 2011-08-29 LAB — CBC
HCT: 33 % — ABNORMAL LOW (ref 36.0–46.0)
MCH: 30.9 pg (ref 26.0–34.0)
MCV: 95.4 fL (ref 78.0–100.0)
Platelets: 190 10*3/uL (ref 150–400)
RBC: 3.46 MIL/uL — ABNORMAL LOW (ref 3.87–5.11)

## 2011-08-29 LAB — GLUCOSE, CAPILLARY: Glucose-Capillary: 98 mg/dL (ref 70–99)

## 2011-08-29 LAB — BASIC METABOLIC PANEL
BUN: 11 mg/dL (ref 6–23)
CO2: 29 mEq/L (ref 19–32)
Calcium: 9 mg/dL (ref 8.4–10.5)
Creatinine, Ser: 0.82 mg/dL (ref 0.50–1.10)

## 2011-08-30 LAB — GLUCOSE, CAPILLARY
Glucose-Capillary: 98 mg/dL (ref 70–99)
Glucose-Capillary: 109 mg/dL — ABNORMAL HIGH (ref 70–99)

## 2011-09-15 NOTE — Op Note (Signed)
[name], [name]                ACCOUNT NO.:  [id number]  MEDICAL RECORD NO.:  [id number]  LOCATION:  1616                         FACILITY:  [facility]  PHYSICIAN:  Arijana Narayan L. Rendall, M.D.  DATE OF BIRTH:  [birth year]  DATE OF PROCEDURE:  08/26/2011 DATE OF DISCHARGE:                              OPERATIVE REPORT   PREOPERATIVE DIAGNOSIS:  Osteoarthritis, right knee.  SURGICAL PROCEDURE:  Right LCS total knee arthroplasty with computer navigation assistance.  POSTOPERATIVE DIAGNOSIS:  Osteoarthritis, right knee.  SURGEON:  Jaylyne Breese L. Rendall, M.D.  ASSISTANTSu Hilt, PA-C - present and participating in entire procedure.  ANESTHESIA:  General.  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  She has angry spurring along all articular surfaces of the margins of the femur and patella to where the patella actually sort of looked like a [place] daisy surrounded by inflammatory spurs.  The synovial lining tissue was red and angry, but there was no evidence of purulence or other pathology.  PROCEDURE:  Under general anesthesia, the right leg was prepared with DuraPrep and draped as a sterile field.  Sterile tourniquet was applied proximally and legs wrapped out with the Esmarch and the tourniquet was elevated at 350 mm.  Midline incision was made, carried deep to medial parapatellar.  The patella is everted.  The femur was sized to the standard plus.  Extensive spurring was taken down on the patella and the femur to allow for appropriate sizing of components.  It was estimated that femur was a standard plus.  It should be known that this patient's osteoarthritis was very severe, totally bare bone medially, two-thirds bare bone laterally, and the femur was subluxing posteriorly on the tibia to where the tibia was actually almost dislocating anteriorly. She had restricted range of motion with inability to fully extend.  Once debridement was done in  preparation for computer mapping, the Schanz pins were placed in the superior medial tibia and distal femur. The arrays were set up. All the mapping was completed.  Using the tibial guide for the computer, proximal tibial resection was done.  It should be noted she had an unusually large deformity of the proximal femur where it slanted posteriorly, probably 10 or 12 degrees.  This required a significant resection of the anterior surface of the tibia.  Computer was highly helpful in navigating through this.  With the proximal tibial resection done, the balancing was done within 1-1.5 degree of anatomic alignment.  The flexion gap was then measured.  Then using the third guide the anterior and posterior flare of the distal femur were resected within 1.5 degree of anatomic rotation.  The distal femoral cut was then made and flexion and extension gaps were balanced at 10 mm each.  The lamina spreader was then inserted.  Remnants of the menisci and cruciates were resected.  A 2 cm excised loose body was removed from the popliteal space.  A large popliteal cyst was debrided laterally.  Once all the spurs were taken off back to the femur,  the recessing guide was used.  With this complete, attention was turned to the tibia.  A decision was made  to go with a revision mobile bearing tibial component simply because of increased stability in the knee that was subluxing. The mobile bearing tibia size #3 was then used.  A 12.5 bearing was ultimately used.  A trial was done with a #10, but the 12.5 gave a better fit, excellent fit to the standard plus femur.  At this point, permanent components were obtained.  Patella was osteotomized.  Bony surfaces prepared with pulse irrigation.  All components were cemented in place and a MBT revision tray size #3.  The SmartSet HV Bone Cement, the 12.5 standard plus bearing, 12.5 standard femoral component, and standard plus patellar component.  With all components  cemented in place and the cement hardened, the tourniquet was let down at approximately an hour and 8 or 9 minutes.  Multiple small vessels were cauterized.  A full synovectomy was completed while waiting for cement to harden.  A medium Hemovac drain was used.  The knee was then closed in layers with #1 Ti-Cron, 0 Vicryl, 2-0 Vicryl, and skin clips.  Patient tolerated the procedure well and returned to recovery in good condition.     [name], M.D.     Renato Gails  D:  08/26/2011  T:  08/26/2011  Job:  [id number]  Electronically Signed by Erasmo Leventhal M.D. on 09/15/2011 02:05:04 PM
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
## 2011-10-12 ENCOUNTER — Ambulatory Visit: Payer: Medicare Other | Attending: Orthopedic Surgery | Admitting: Rehabilitation

## 2011-10-12 ENCOUNTER — Ambulatory Visit: Payer: Medicare Other | Admitting: Rehabilitation

## 2011-10-13 ENCOUNTER — Ambulatory Visit: Payer: Medicare Other | Admitting: Rehabilitation

## 2011-10-25 ENCOUNTER — Encounter (HOSPITAL_COMMUNITY): Payer: Self-pay

## 2011-10-25 ENCOUNTER — Other Ambulatory Visit: Payer: Self-pay | Admitting: Orthopedic Surgery

## 2011-10-25 MED ORDER — ACETAMINOPHEN 10 MG/ML IV SOLN: 1000.0000 mg | Freq: Once | INTRAVENOUS | Status: AC

## 2011-10-26 ENCOUNTER — Encounter (HOSPITAL_COMMUNITY): Payer: Medicare Other

## 2011-10-26 ENCOUNTER — Ambulatory Visit: Payer: Medicare Other

## 2011-10-26 ENCOUNTER — Other Ambulatory Visit (HOSPITAL_COMMUNITY): Payer: Medicare Other

## 2011-10-26 ENCOUNTER — Encounter (HOSPITAL_COMMUNITY): Payer: Self-pay

## 2011-10-26 LAB — PROTIME-INR
INR: 1.11 (ref 0.00–1.49)
Prothrombin Time: 14.5 seconds (ref 11.6–15.2)

## 2011-10-26 LAB — COMPREHENSIVE METABOLIC PANEL
Alkaline Phosphatase: 20 U/L — ABNORMAL LOW (ref 39–117)
BUN: 25 mg/dL — ABNORMAL HIGH (ref 6–23)
GFR calc Af Amer: 57 mL/min — ABNORMAL LOW (ref >90)
Glucose, Bld: 87 mg/dL (ref 70–99)
Potassium: 4 mEq/L (ref 3.5–5.1)
Total Protein: 6.6 g/dL (ref 6.0–8.3)

## 2011-10-26 LAB — CBC
HCT: 42.4 % (ref 36.0–46.0)
Hemoglobin: 14.2 g/dL (ref 12.0–15.0)
MCH: 31.7 pg (ref 26.0–34.0)
MCHC: 33.5 g/dL (ref 30.0–36.0)

## 2011-10-26 LAB — URINALYSIS, ROUTINE W REFLEX MICROSCOPIC
Bilirubin Urine: NEGATIVE
Glucose, UA: NEGATIVE mg/dL
Hgb urine dipstick: NEGATIVE
Specific Gravity, Urine: 1.017 (ref 1.005–1.030)
pH: 6 (ref 5.0–8.0)

## 2011-10-26 LAB — APTT: aPTT: 35 seconds (ref 24–37)

## 2011-10-26 LAB — DIFFERENTIAL
Basophils Absolute: 0 10*3/uL (ref 0.0–0.1)
Basophils Relative: 0 % (ref 0–1)
Eosinophils Absolute: 0.1 10*3/uL (ref 0.0–0.7)
Lymphs Abs: 2.1 10*3/uL (ref 0.7–4.0)
Neutrophils Relative %: 65 % (ref 43–77)

## 2011-10-26 MED ORDER — CHLORHEXIDINE GLUCONATE 4 % EX LIQD: 60.0000 mL | Freq: Once | CUTANEOUS | Status: AC

## 2011-10-26 NOTE — Pre-Procedure Instructions (Signed)
CXR REPORT [facility] 03/22/11 ON THIS CHART NUCLEAR STRESS TEST REPORT WITH EKG 04/19/11 ON CHART FROM DR. NISHAN ON CHART-NORMAL, NO ISCHEMIA EKG'S FROM [facility] MARCH 2012 ALSO ON CHART OFFICE NOTE FROM DR. PENUMALLI-NEUROLOGIST DATED 08/19/11 ON CHART OFFICE NOTES [facility]-DR. HAMRICK ON THIS CHART FROM 02/19/11.

## 2011-10-26 NOTE — Patient Instructions (Signed)
20 [name]  10/26/2011   Your procedure is scheduled on: Thursday 11/15  AT 10:50AM  Report to [facility] at 8;45 AM.  Call this number if you have problems the morning of surgery: [phone]   Remember:   Do not eat food OR DRINK ANYTHING AFTER MIDNIGHT-THE NIGHT BEFORE SURGERY. .  Take these medicines the morning of surgery with A SIP OF WATER: ALPRAZOLAM(XANAX)   LEXAPRO   NEXIUM   KEPPRA   BYSTOLIC   OXYCODONE   LYRICA   Do not wear jewelry, make-up or nail polish.  Do not wear lotions, powders, or perfumes. You may wear deodorant.  Do not shave 48 hours prior to surgery.  Do not bring valuables to the hospital.  Contacts, dentures or bridgework may not be worn into surgery.  Leave suitcase in the car. After surgery it may be brought to your room.  For patients admitted to the hospital, checkout time is 11:00 AM the day of discharge.   Patients discharged the day of surgery will not be allowed to drive home.  Name and phone number of your driver:  Special Instructions: CHG Shower Use Special Wash: 1/2 bottle night before surgery and 1/2 bottle morning of surgery.   Please read over the following fact sheets that you were given: Blood Transfusion Information and MRSA Information, INCENTIVE SPIROMETRY

## 2011-10-27 ENCOUNTER — Other Ambulatory Visit: Payer: Self-pay | Admitting: Orthopedic Surgery

## 2011-10-27 DIAGNOSIS — M1712 Unilateral primary osteoarthritis, left knee: Secondary | ICD-10-CM | POA: Insufficient documentation
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
## 2011-10-28 ENCOUNTER — Inpatient Hospital Stay (HOSPITAL_COMMUNITY)
Admission: RE | Admit: 2011-10-28 | Discharge: 2011-11-01 | DRG: 470 | Disposition: A | Discharge status: Home Health | Payer: Medicare Other | Source: Ambulatory Visit | Attending: Orthopedic Surgery | Admitting: Orthopedic Surgery

## 2011-10-28 ENCOUNTER — Encounter (HOSPITAL_COMMUNITY): Payer: Self-pay | Admitting: *Deleted

## 2011-10-28 ENCOUNTER — Encounter (HOSPITAL_COMMUNITY)
Admission: RE | Disposition: A | Discharge status: Home Health | Payer: Self-pay | Source: Ambulatory Visit | Attending: Orthopedic Surgery

## 2011-10-28 ENCOUNTER — Inpatient Hospital Stay (HOSPITAL_COMMUNITY): Payer: Medicare Other | Admitting: Anesthesiology

## 2011-10-28 ENCOUNTER — Encounter (HOSPITAL_COMMUNITY): Payer: Self-pay | Admitting: Anesthesiology

## 2011-10-28 DIAGNOSIS — K219 Gastro-esophageal reflux disease without esophagitis: Secondary | ICD-10-CM | POA: Diagnosis present

## 2011-10-28 DIAGNOSIS — I10 Essential (primary) hypertension: Secondary | ICD-10-CM | POA: Diagnosis present

## 2011-10-28 DIAGNOSIS — J4489 Other specified chronic obstructive pulmonary disease: Secondary | ICD-10-CM | POA: Diagnosis present

## 2011-10-28 DIAGNOSIS — M1710 Unilateral primary osteoarthritis, unspecified knee: Principal | ICD-10-CM | POA: Diagnosis present

## 2011-10-28 DIAGNOSIS — D62 Acute posthemorrhagic anemia: Secondary | ICD-10-CM | POA: Diagnosis not present

## 2011-10-28 DIAGNOSIS — F3289 Other specified depressive episodes: Secondary | ICD-10-CM | POA: Diagnosis present

## 2011-10-28 DIAGNOSIS — M1712 Unilateral primary osteoarthritis, left knee: Secondary | ICD-10-CM

## 2011-10-28 DIAGNOSIS — E119 Type 2 diabetes mellitus without complications: Secondary | ICD-10-CM | POA: Diagnosis present

## 2011-10-28 DIAGNOSIS — F411 Generalized anxiety disorder: Secondary | ICD-10-CM | POA: Diagnosis present

## 2011-10-28 DIAGNOSIS — F17200 Nicotine dependence, unspecified, uncomplicated: Secondary | ICD-10-CM | POA: Diagnosis present

## 2011-10-28 DIAGNOSIS — K5900 Constipation, unspecified: Secondary | ICD-10-CM | POA: Diagnosis not present

## 2011-10-28 DIAGNOSIS — Z01812 Encounter for preprocedural laboratory examination: Secondary | ICD-10-CM

## 2011-10-28 DIAGNOSIS — F329 Major depressive disorder, single episode, unspecified: Secondary | ICD-10-CM | POA: Diagnosis present

## 2011-10-28 DIAGNOSIS — J449 Chronic obstructive pulmonary disease, unspecified: Secondary | ICD-10-CM | POA: Diagnosis present

## 2011-10-28 HISTORY — PX: KNEE ARTHROPLASTY: SHX992

## 2011-10-28 LAB — GLUCOSE, CAPILLARY: Glucose-Capillary: 84 mg/dL (ref 70–99)

## 2011-10-28 SURGERY — ARTHROPLASTY, KNEE, TOTAL, USING IMAGELESS COMPUTER-ASSISTED NAVIGATION
Anesthesia: General | Site: Knee | Laterality: Left | Wound class: Clean

## 2011-10-28 MED ORDER — MAGNESIUM HYDROXIDE 400 MG/5ML PO SUSP: 30.0000 mL | Freq: Two times a day (BID) | ORAL | Status: DC | PRN

## 2011-10-28 MED ORDER — DIPHENHYDRAMINE HCL 12.5 MG/5ML PO ELIX
12.5000 mg | ORAL_SOLUTION | Freq: Four times a day (QID) | ORAL | Status: DC | PRN
  Filled 2011-10-28: qty 5

## 2011-10-28 MED ORDER — HYDROMORPHONE HCL PF 1 MG/ML IJ SOLN
0.2500 mg | INTRAMUSCULAR | Status: DC | PRN
  Administered 2011-10-28 (×4): 0.5 mg via INTRAVENOUS

## 2011-10-28 MED ORDER — ONDANSETRON HCL 4 MG PO TABS: 4.0000 mg | ORAL_TABLET | Freq: Four times a day (QID) | ORAL | Status: DC | PRN

## 2011-10-28 MED ORDER — METHOCARBAMOL 500 MG PO TABS
500.0000 mg | ORAL_TABLET | Freq: Four times a day (QID) | ORAL | Status: DC | PRN
  Administered 2011-10-28: 500 mg via ORAL
  Filled 2011-10-28: qty 1

## 2011-10-28 MED ORDER — DIPHENHYDRAMINE HCL 12.5 MG/5ML PO ELIX: 12.5000 mg | ORAL_SOLUTION | ORAL | Status: DC | PRN

## 2011-10-28 MED ORDER — PANTOPRAZOLE SODIUM 40 MG PO TBEC
40.0000 mg | DELAYED_RELEASE_TABLET | Freq: Every day | ORAL | Status: DC
  Administered 2011-10-29 – 2011-11-01 (×4): 40 mg via ORAL
  Filled 2011-10-28 (×4): qty 1

## 2011-10-28 MED ORDER — HYDROCHLOROTHIAZIDE 25 MG PO TABS
25.0000 mg | ORAL_TABLET | ORAL | Status: DC
  Administered 2011-10-29 – 2011-11-01 (×4): 25 mg via ORAL
  Filled 2011-10-28 (×4): qty 1

## 2011-10-28 MED ORDER — PROMETHAZINE HCL 25 MG/ML IJ SOLN: 6.2500 mg | INTRAMUSCULAR | Status: DC | PRN

## 2011-10-28 MED ORDER — ROPIVACAINE HCL 5 MG/ML IJ SOLN
INTRAMUSCULAR | Status: DC | PRN
  Administered 2011-10-28: 30 mL via EPIDURAL

## 2011-10-28 MED ORDER — ALBUTEROL SULFATE (2.5 MG/3ML) 0.083% IN NEBU
INHALATION_SOLUTION | RESPIRATORY_TRACT | Status: DC | PRN
  Administered 2011-10-28: 5 mL via RESPIRATORY_TRACT

## 2011-10-28 MED ORDER — OLMESARTAN MEDOXOMIL 40 MG PO TABS
40.0000 mg | ORAL_TABLET | ORAL | Status: DC
  Administered 2011-10-29 – 2011-11-01 (×5): 40 mg via ORAL
  Filled 2011-10-28 (×4): qty 1

## 2011-10-28 MED ORDER — FENTANYL CITRATE 0.05 MG/ML IJ SOLN
INTRAMUSCULAR | Status: DC | PRN
  Administered 2011-10-28 (×3): 50 ug via INTRAVENOUS
  Administered 2011-10-28: 25 ug via INTRAVENOUS
  Administered 2011-10-28: 50 ug via INTRAVENOUS
  Administered 2011-10-28: 25 ug via INTRAVENOUS

## 2011-10-28 MED ORDER — POVIDONE-IODINE 10 % EX SOLN
Freq: Once | CUTANEOUS | Status: DC
  Filled 2011-10-28: qty 118

## 2011-10-28 MED ORDER — METHOCARBAMOL 100 MG/ML IJ SOLN
500.0000 mg | Freq: Four times a day (QID) | INTRAVENOUS | Status: DC | PRN
  Filled 2011-10-28: qty 5

## 2011-10-28 MED ORDER — HYDROMORPHONE HCL PF 1 MG/ML IJ SOLN
INTRAMUSCULAR | Status: DC | PRN
  Administered 2011-10-28 (×4): 0.5 mg via INTRAVENOUS

## 2011-10-28 MED ORDER — DIPHENHYDRAMINE HCL 50 MG/ML IJ SOLN: 12.5000 mg | Freq: Four times a day (QID) | INTRAMUSCULAR | Status: DC | PRN

## 2011-10-28 MED ORDER — NALOXONE HCL 0.4 MG/ML IJ SOLN: 0.4000 mg | INTRAMUSCULAR | Status: DC | PRN

## 2011-10-28 MED ORDER — HYDRALAZINE HCL 20 MG/ML IJ SOLN
INTRAMUSCULAR | Status: DC | PRN
  Administered 2011-10-28: 5 mg via INTRAVENOUS
  Administered 2011-10-28: 3 mg via INTRAVENOUS

## 2011-10-28 MED ORDER — LEVETIRACETAM 500 MG PO TABS
500.0000 mg | ORAL_TABLET | Freq: Two times a day (BID) | ORAL | Status: DC
  Administered 2011-10-28 – 2011-11-01 (×8): 500 mg via ORAL
  Filled 2011-10-28 (×9): qty 1

## 2011-10-28 MED ORDER — ONDANSETRON HCL 4 MG/2ML IJ SOLN
INTRAMUSCULAR | Status: DC | PRN
  Administered 2011-10-28: 4 mg via INTRAVENOUS

## 2011-10-28 MED ORDER — HYDROCHLOROTHIAZIDE 25 MG PO TABS: 25.0000 mg | ORAL_TABLET | Freq: Every day | ORAL | Status: DC

## 2011-10-28 MED ORDER — HYDROMORPHONE 0.3 MG/ML IV SOLN
INTRAVENOUS | Status: DC
  Administered 2011-10-28: 0.3 mg via INTRAVENOUS
  Administered 2011-10-29: 4.5 mg via INTRAVENOUS
  Administered 2011-10-29: 7.5 mg via INTRAVENOUS
  Filled 2011-10-28: qty 25

## 2011-10-28 MED ORDER — ACETAMINOPHEN 10 MG/ML IV SOLN
INTRAVENOUS | Status: DC | PRN
  Administered 2011-10-28: 1000 mg via INTRAVENOUS

## 2011-10-28 MED ORDER — RIVAROXABAN 10 MG PO TABS
10.0000 mg | ORAL_TABLET | Freq: Every day | ORAL | Status: DC
  Administered 2011-10-28: 10 mg via ORAL
  Filled 2011-10-28 (×3): qty 1

## 2011-10-28 MED ORDER — DOCUSATE SODIUM 100 MG PO CAPS
100.0000 mg | ORAL_CAPSULE | Freq: Two times a day (BID) | ORAL | Status: DC
  Administered 2011-10-28 – 2011-11-01 (×8): 100 mg via ORAL
  Filled 2011-10-28 (×9): qty 1

## 2011-10-28 MED ORDER — SODIUM CHLORIDE 0.9 % IV SOLN: INTRAVENOUS | Status: DC

## 2011-10-28 MED ORDER — FENOFIBRATE 54 MG PO TABS
54.0000 mg | ORAL_TABLET | Freq: Every day | ORAL | Status: DC
  Administered 2011-10-29 – 2011-11-01 (×4): 54 mg via ORAL
  Filled 2011-10-28 (×5): qty 1

## 2011-10-28 MED ORDER — OLMESARTAN MEDOXOMIL 40 MG PO TABS: 40.0000 mg | ORAL_TABLET | Freq: Every day | ORAL | Status: DC

## 2011-10-28 MED ORDER — NEBIVOLOL HCL 5 MG PO TABS
5.0000 mg | ORAL_TABLET | Freq: Every day | ORAL | Status: DC
  Administered 2011-10-29 – 2011-11-01 (×4): 5 mg via ORAL
  Filled 2011-10-28 (×4): qty 1

## 2011-10-28 MED ORDER — SODIUM CHLORIDE 0.9 % IR SOLN
Status: DC | PRN
  Administered 2011-10-28: 3000 mL

## 2011-10-28 MED ORDER — THERA M PLUS PO TABS
1.0000 | ORAL_TABLET | Freq: Every day | ORAL | Status: DC
  Administered 2011-10-29: 10:00:00 via ORAL
  Administered 2011-10-30 – 2011-11-01 (×3): 1 via ORAL
  Filled 2011-10-28 (×4): qty 1

## 2011-10-28 MED ORDER — METHOCARBAMOL 100 MG/ML IJ SOLN
500.0000 mg | INTRAMUSCULAR | Status: AC
  Administered 2011-10-28: 500 mg via INTRAVENOUS
  Filled 2011-10-28: qty 5

## 2011-10-28 MED ORDER — ALUM & MAG HYDROXIDE-SIMETH 200-200-20 MG/5ML PO SUSP: 30.0000 mL | ORAL | Status: DC | PRN

## 2011-10-28 MED ORDER — SODIUM CHLORIDE 0.9 % IV SOLN
INTRAVENOUS | Status: DC
  Administered 2011-10-28 – 2011-10-30 (×3): via INTRAVENOUS

## 2011-10-28 MED ORDER — SODIUM CHLORIDE 0.9 % IR SOLN
Status: DC | PRN
  Administered 2011-10-28: 1000 mL

## 2011-10-28 MED ORDER — ESCITALOPRAM OXALATE 20 MG PO TABS: 40.0000 mg | ORAL_TABLET | Freq: Every day | ORAL | Status: DC

## 2011-10-28 MED ORDER — SODIUM CHLORIDE 0.9 % IJ SOLN: 9.0000 mL | INTRAMUSCULAR | Status: DC | PRN

## 2011-10-28 MED ORDER — LACTATED RINGERS IV SOLN
INTRAVENOUS | Status: DC
  Administered 2011-10-28 (×2): via INTRAVENOUS
  Administered 2011-10-28: 1000 mL via INTRAVENOUS

## 2011-10-28 MED ORDER — GLYCOPYRROLATE 0.2 MG/ML IJ SOLN
INTRAMUSCULAR | Status: DC | PRN
  Administered 2011-10-28: .6 mg via INTRAVENOUS

## 2011-10-28 MED ORDER — MIDAZOLAM HCL 5 MG/5ML IJ SOLN
INTRAMUSCULAR | Status: DC | PRN
  Administered 2011-10-28: 2 mg via INTRAVENOUS

## 2011-10-28 MED ORDER — OXYCODONE HCL 5 MG PO TABS
10.0000 mg | ORAL_TABLET | ORAL | Status: DC | PRN
  Administered 2011-10-29: 20 mg via ORAL
  Administered 2011-10-29: 10 mg via ORAL
  Administered 2011-10-29 (×2): 20 mg via ORAL
  Administered 2011-10-30 (×4): 10 mg via ORAL
  Administered 2011-10-30: 20 mg via ORAL
  Administered 2011-10-30: 10 mg via ORAL
  Administered 2011-10-31 – 2011-11-01 (×8): 20 mg via ORAL
  Filled 2011-10-28 (×8): qty 4
  Filled 2011-10-28: qty 2
  Filled 2011-10-28: qty 4
  Filled 2011-10-28 (×2): qty 2
  Filled 2011-10-28: qty 1
  Filled 2011-10-28 (×3): qty 4
  Filled 2011-10-28 (×2): qty 2
  Filled 2011-10-28: qty 1

## 2011-10-28 MED ORDER — LIDOCAINE HCL (CARDIAC) 20 MG/ML IV SOLN
INTRAVENOUS | Status: DC | PRN
  Administered 2011-10-28: 80 mg via INTRAVENOUS

## 2011-10-28 MED ORDER — PREGABALIN 50 MG PO CAPS
50.0000 mg | ORAL_CAPSULE | Freq: Two times a day (BID) | ORAL | Status: DC
  Administered 2011-10-28 – 2011-11-01 (×8): 50 mg via ORAL
  Filled 2011-10-28 (×8): qty 1

## 2011-10-28 MED ORDER — PROPOFOL 10 MG/ML IV EMUL
INTRAVENOUS | Status: DC | PRN
  Administered 2011-10-28: 50 mg via INTRAVENOUS
  Administered 2011-10-28: 150 mg via INTRAVENOUS

## 2011-10-28 MED ORDER — ESCITALOPRAM OXALATE 20 MG PO TABS
40.0000 mg | ORAL_TABLET | ORAL | Status: DC
  Administered 2011-10-29 – 2011-11-01 (×4): 40 mg via ORAL
  Filled 2011-10-28 (×4): qty 2

## 2011-10-28 MED ORDER — MENTHOL 3 MG MT LOZG: 1.0000 | LOZENGE | OROMUCOSAL | Status: DC | PRN

## 2011-10-28 MED ORDER — FLEET ENEMA 7-19 GM/118ML RE ENEM: 1.0000 | ENEMA | Freq: Every day | RECTAL | Status: DC | PRN

## 2011-10-28 MED ORDER — HYDROMORPHONE HCL PF 1 MG/ML IJ SOLN
INTRAMUSCULAR | Status: AC
  Filled 2011-10-28: qty 1

## 2011-10-28 MED ORDER — ONDANSETRON HCL 4 MG/2ML IJ SOLN: 4.0000 mg | Freq: Four times a day (QID) | INTRAMUSCULAR | Status: DC | PRN

## 2011-10-28 MED ORDER — METHOCARBAMOL 100 MG/ML IJ SOLN
500.0000 mg | INTRAVENOUS | Status: AC
  Filled 2011-10-28: qty 5

## 2011-10-28 MED ORDER — MULTIVITAMINS PO TABS: 1.0000 | ORAL_TABLET | Freq: Every day | ORAL | Status: DC

## 2011-10-28 MED ORDER — ALPRAZOLAM 0.5 MG PO TABS
0.5000 mg | ORAL_TABLET | Freq: Three times a day (TID) | ORAL | Status: DC | PRN
  Administered 2011-10-28 – 2011-11-01 (×7): 0.5 mg via ORAL
  Filled 2011-10-28 (×7): qty 1

## 2011-10-28 MED ORDER — METOCLOPRAMIDE HCL 5 MG/ML IJ SOLN: 5.0000 mg | Freq: Three times a day (TID) | INTRAMUSCULAR | Status: DC | PRN

## 2011-10-28 MED ORDER — ROCURONIUM BROMIDE 100 MG/10ML IV SOLN
INTRAVENOUS | Status: DC | PRN
  Administered 2011-10-28: 10 mg via INTRAVENOUS
  Administered 2011-10-28: 40 mg via INTRAVENOUS

## 2011-10-28 MED ORDER — BISACODYL 10 MG RE SUPP: 10.0000 mg | Freq: Every day | RECTAL | Status: DC | PRN

## 2011-10-28 MED ORDER — TEMAZEPAM 15 MG PO CAPS
15.0000 mg | ORAL_CAPSULE | Freq: Every evening | ORAL | Status: DC | PRN
Start: 2011-10-28 — End: 2011-11-01

## 2011-10-28 MED ORDER — METOCLOPRAMIDE HCL 10 MG PO TABS: 5.0000 mg | ORAL_TABLET | Freq: Three times a day (TID) | ORAL | Status: DC | PRN

## 2011-10-28 MED ORDER — CEFAZOLIN SODIUM 1-5 GM-% IV SOLN
1.0000 g | Freq: Four times a day (QID) | INTRAVENOUS | Status: AC
  Administered 2011-10-28 – 2011-10-29 (×3): 1 g via INTRAVENOUS
  Filled 2011-10-28 (×3): qty 50

## 2011-10-28 MED ORDER — BISACODYL 5 MG PO TBEC: 10.0000 mg | DELAYED_RELEASE_TABLET | Freq: Every day | ORAL | Status: DC | PRN

## 2011-10-28 MED ORDER — CALCIUM CARBONATE-VITAMIN D 500-200 MG-UNIT PO TABS
1.0000 | ORAL_TABLET | Freq: Every day | ORAL | Status: DC
  Administered 2011-10-29: 10:00:00 via ORAL
  Administered 2011-10-30 – 2011-11-01 (×3): 1 via ORAL
  Filled 2011-10-28 (×4): qty 1

## 2011-10-28 MED ORDER — EPHEDRINE SULFATE 50 MG/ML IJ SOLN
INTRAMUSCULAR | Status: DC | PRN
  Administered 2011-10-28: 10 mg via INTRAVENOUS

## 2011-10-28 MED ORDER — RIVAROXABAN 10 MG PO TABS
10.0000 mg | ORAL_TABLET | ORAL | Status: DC
  Filled 2011-10-28: qty 1

## 2011-10-28 MED ORDER — PHENOL 1.4 % MT LIQD: 1.0000 | OROMUCOSAL | Status: DC | PRN

## 2011-10-28 MED ORDER — ACETAMINOPHEN 10 MG/ML IV SOLN
1000.0000 mg | Freq: Four times a day (QID) | INTRAVENOUS | Status: AC
  Administered 2011-10-28 – 2011-10-29 (×4): 1000 mg via INTRAVENOUS
  Filled 2011-10-28 (×4): qty 100

## 2011-10-28 MED ORDER — CEFAZOLIN SODIUM-DEXTROSE 2-3 GM-% IV SOLR
2.0000 g | Freq: Once | INTRAVENOUS | Status: AC
  Administered 2011-10-28: 2 g via INTRAVENOUS

## 2011-10-28 MED ORDER — NEOSTIGMINE METHYLSULFATE 1 MG/ML IJ SOLN
INTRAMUSCULAR | Status: DC | PRN
  Administered 2011-10-28: 4 mg via INTRAVENOUS

## 2011-10-28 MED ORDER — OLMESARTAN MEDOXOMIL-HCTZ 40-25 MG PO TABS: 1.0000 | ORAL_TABLET | ORAL | Status: DC

## 2011-10-28 SURGICAL SUPPLY — 58 items
BAG SPEC THK2 15X12 ZIP CLS (MISCELLANEOUS) ×1
BAG ZIPLOCK 12X15 (MISCELLANEOUS) ×2 IMPLANT
BANDAGE ESMARK 6X9 LF (GAUZE/BANDAGES/DRESSINGS) ×1 IMPLANT
BLADE SAGITTAL 25.0X1.27X90 (BLADE) ×2 IMPLANT
BLADE SURG SZ11 CARB STEEL (BLADE) ×1 IMPLANT
BNDG CMPR 9X6 STRL LF SNTH (GAUZE/BANDAGES/DRESSINGS) ×1
BNDG ESMARK 6X9 LF (GAUZE/BANDAGES/DRESSINGS) ×2
BOWL SMART MIX CTS (DISPOSABLE) ×2 IMPLANT
CEMENT HV SMART SET (Cement) ×4 IMPLANT
CLOTH BEACON ORANGE TIMEOUT ST (SAFETY) ×2 IMPLANT
CUFF TOURN SGL QUICK 34 (TOURNIQUET CUFF) ×2
CUFF TOURN SGL QUICK 44 (TOURNIQUET CUFF) IMPLANT
CUFF TRNQT CYL 34X4X40X1 (TOURNIQUET CUFF) IMPLANT
DRAPE EXTREMITY T 121X128X90 (DRAPE) ×2 IMPLANT
DRAPE LG THREE QUARTER DISP (DRAPES) ×2 IMPLANT
DRAPE POUCH INSTRU U-SHP 10X18 (DRAPES) ×2 IMPLANT
DRSG EMULSION OIL 3X3 NADH (GAUZE/BANDAGES/DRESSINGS) ×2 IMPLANT
DRSG MEPILEX BORDER 4X12 (GAUZE/BANDAGES/DRESSINGS) ×2 IMPLANT
DRSG MEPILEX BORDER 4X4 (GAUZE/BANDAGES/DRESSINGS) ×1 IMPLANT
DRSG TEGADERM 4X4.75 (GAUZE/BANDAGES/DRESSINGS) ×3 IMPLANT
DURAPREP 26ML APPLICATOR (WOUND CARE) ×2 IMPLANT
ELECT REM PT RETURN 9FT ADLT (ELECTROSURGICAL) ×2
ELECTRODE REM PT RTRN 9FT ADLT (ELECTROSURGICAL) ×1 IMPLANT
EVACUATOR 1/8 PVC DRAIN (DRAIN) ×2 IMPLANT
FACESHIELD LNG OPTICON STERILE (SAFETY) ×5 IMPLANT
GLOVE BIOGEL PI IND STRL 7.5 (GLOVE) ×1 IMPLANT
GLOVE BIOGEL PI IND STRL 8.5 (GLOVE) ×1 IMPLANT
GLOVE BIOGEL PI INDICATOR 7.5 (GLOVE) ×1
GLOVE BIOGEL PI INDICATOR 8.5 (GLOVE) ×1
GLOVE ECLIPSE 8.5 STRL (GLOVE) ×3 IMPLANT
GLOVE SURG SS PI 7.0 STRL IVOR (GLOVE) ×2 IMPLANT
GOWN PREVENTION PLUS XLARGE (GOWN DISPOSABLE) ×5 IMPLANT
GOWN STRL NON-REIN LRG LVL3 (GOWN DISPOSABLE) ×2 IMPLANT
GOWN STRL REIN XL XLG (GOWN DISPOSABLE) ×2 IMPLANT
HANDPIECE INTERPULSE COAX TIP (DISPOSABLE) ×2
HOOD PEEL AWAY FACE SHEILD DIS (HOOD) ×4 IMPLANT
KIT BASIN OR (CUSTOM PROCEDURE TRAY) ×2 IMPLANT
MANIFOLD NEPTUNE II (INSTRUMENTS) ×2 IMPLANT
MARKER SPHERE PSV REFLC THRD 5 (MARKER) ×6 IMPLANT
NDL MA TROC 1/2 (NEEDLE) ×1 IMPLANT
NEEDLE MA TROC 1/2 (NEEDLE) ×2 IMPLANT
NS IRRIG 1000ML POUR BTL (IV SOLUTION) ×2 IMPLANT
PACK ICE MAXI GEL EZY WRAP (MISCELLANEOUS) ×2 IMPLANT
PACK TOTAL JOINT (CUSTOM PROCEDURE TRAY) ×2 IMPLANT
PIN SCHANZ 4MM 130MM (PIN) ×8 IMPLANT
POSITIONER SURGICAL ARM (MISCELLANEOUS) ×2 IMPLANT
SET HNDPC FAN SPRY TIP SCT (DISPOSABLE) ×1 IMPLANT
SPONGE GAUZE 2X2 8PLY STRL LF (GAUZE/BANDAGES/DRESSINGS) ×1 IMPLANT
STAPLER VISISTAT 35W (STAPLE) ×1 IMPLANT
SUT BONE WAX W31G (SUTURE) ×2 IMPLANT
SUT TICRON (SUTURE) ×6 IMPLANT
SUT VIC AB 1 CT1 27 (SUTURE) ×4
SUT VIC AB 1 CT1 27XBRD ANTBC (SUTURE) ×2 IMPLANT
SUT VIC AB 2-0 CT1 27 (SUTURE) ×4
SUT VIC AB 2-0 CT1 TAPERPNT 27 (SUTURE) ×2 IMPLANT
TOWEL OR 17X26 10 PK STRL BLUE (TOWEL DISPOSABLE) ×6 IMPLANT
TRAY FOLEY CATH 14FRSI W/METER (CATHETERS) ×2 IMPLANT
WATER STERILE IRR 1500ML POUR (IV SOLUTION) ×2 IMPLANT

## 2011-10-28 NOTE — Transfer of Care (Signed)
Immediate Anesthesia Transfer of Care Note  Patient: [name]  Procedure(s) Performed:  COMPUTER ASSISTED TOTAL KNEE ARTHROPLASTY - preop femoral nerve block  Patient Location: PACU  Anesthesia Type: General  Level of Consciousness: sedated, patient cooperative and responds to stimulaton  Airway & Oxygen Therapy: Patient Spontanous Breathing and Patient connected to face mask oxgen  Post-op Assessment: Report given to PACU RN and Post -op Vital signs reviewed and stable  Post vital signs: Reviewed and stable  Complications: No apparent anesthesia complications

## 2011-10-28 NOTE — Op Note (Signed)
[name], [name]                ACCOUNT NO.:  [id number]  MEDICAL RECORD NO.:  [id number]  LOCATION:  [facility]                         FACILITY:  [facility]  PHYSICIAN:  Emanuell Morina L. Rendall, M.D.  DATE OF BIRTH:  [birth year]  DATE OF PROCEDURE:  10/28/2011 DATE OF DISCHARGE:                              OPERATIVE REPORT   PREOPERATIVE DIAGNOSIS:  End-stage osteoarthritis, left knee, with significant deformity.  SURGICAL PROCEDURE:  Left low-contact stress total knee arthroplasty with computer navigation assistance.  POSTOPERATIVE DIAGNOSIS:  End-stage osteoarthritis, left knee, with significant deformity.  SURGEON:  Bunnie Lederman L. Rendall, M.D.  ASSISTANT:  Legrand Pitts. Duffy, PA-C-present and participating in entire procedure.  ANESTHESIA:  General with femoral nerve block.  PATHOLOGY:  The patient has been suffering severely with a bone on bone in her left knee and a 16 degree varus deformity, confirmed at the time of surgery.  When walking down the hall, her knee actually seem to sublux laterally, probably close to the 20degrees, and she requires walking aids and large doses of Percocet.  PROCEDURE IN DETAIL:  Under general anesthesia, the left leg was prepared with DuraPrep and draped as a sterile field.  A sterile tourniquet applied proximally.  Legs wrapped out with the Esmarch and a tourniquet used at 350 mm.  Time-out was done after prepping and draping was completed.  Once the leg was wrapped out with tourniquet and the midline incision was made, the patella was everted, the knee was debrided of multiple spurs and loose bodies, and debridement was done in preparation for computer mapping.  Schanz pins were placed through accessory punctures in the superior medial tibia and in the distal femur through her incision.  The rays were set up.  Femoral head was found. Malleoli are found.  Proximal tibia and distal femur were then mapped out.  Once this was completed, the first tibial guide  was used for proximal tibial resection.  This was done within 1 degree of anatomic accuracy.  The balancer was then used, and the knee ligaments were balanced within 1 degree of anatomic accuracy from hip to ankle.  The flexion gap was then measured at 5 degree difference.  The first femoral guide was then used.  Due to the bone deficiency of lateral femoral condyle, a very peculiar location of the femoral guide was required by the computer for optimal positioning.  This was done, however, and anterior and posterior flares of the distal femur resections were resected.  There was slight notching of the anterior femur, but this was considered minimal.  Attention was then turned to the distal femoral cut.  It was then made again with third computer guide, and flexion and extension gaps were balanced at approximately 10 degrees.  The alignment was within 1 degree of anatomic.  At this point, the proximal tibia was exposed.  It was sized as a #4.  M.B.T revision tray was used as she has had such abnormal bone stock, and with the revision tray in place, the trial reduction of a #4 tibia, 10 bearing, and standard plus femur reveals excellent fit, alignment stability within half degree of anatomic alignment from the hip to the ankle  for 15-1/2 degree correction.  The patella was osteotomized.  3 peg hole trial patella was used.  With this in place, the alignment in all remains correct and the rays were now taken down.  Permanent components were obtained.  Bony surfaces prepared with pulse irrigation.  All components were then cemented in place.  Tourniquet was then let down at 70 minutes.  Once the cement has hardened, excess cement was removed.  Multiple small vessels were cauterized.  Medium Hemovac was inserted.  The knee was then closed in layers with #1 Tycron, #1 Vicryl, 2-0 Vicryl, and skin clips.  Operative time, less than an hour and a half.  The patient tolerated the procedure well and  returned to recovery in good condition.     Beda Dula L. Priscille Kluver, M.D.     Renato Gails  D:  10/28/2011  T:  10/28/2011  Job:  [id number]

## 2011-10-28 NOTE — Progress Notes (Signed)
Orthopedic Tech Progress Note Patient Details:  [name] 08/28/1956 [id number] Pt refused CPM due to pain CPM Left Knee CPM Left Knee: Other (Comment) Left Knee Flexion (Degrees): 90  Left Knee Extension (Degrees): 0  Additional Comments: Pt refused due to pain.   Tawni Carnes [facility] 10/28/2011, 4:44 PM

## 2011-10-28 NOTE — Anesthesia Procedure Notes (Addendum)
Anesthesia Regional Block:  Femoral nerve block  Pre-Anesthetic Checklist: ,, timeout performed, Correct Patient, Correct Site, Correct Laterality, Correct Procedure, Correct Position, site marked, Risks and benefits discussed,  Surgical consent,  Pre-op evaluation,  At surgeon's request and post-op pain management  Laterality: Left  Prep: chloraprep       Needles:  Injection technique: Single-shot  Needle Type: Stimiplex      Needle Gauge: 20 and 20 G    Additional Needles:  Procedures: ultrasound guided Femoral nerve block Narrative:  Start time: 10/28/2011 10:35 AM End time: 10/28/2011 10:45 AM  Performed by: Personally  Anesthesiologist: Jais Demir  Additional Notes: Patient tolerated the procedure well without complications  Femoral nerve block
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
## 2011-10-28 NOTE — Brief Op Note (Signed)
10/28/2011  1:04 PM  PATIENT:  [name]  55 y.o. female  PRE-OPERATIVE DIAGNOSIS:  Osteoarthritis of the Left Knee  POST-OPERATIVE DIAGNOSIS:  Osteoarthritis of the Left Knee  PROCEDURE:  Procedure(s): COMPUTER ASSISTED TOTAL KNEE ARTHROPLASTY   EBL:  Total I/O In: 2000 [I.V.:2000] Out: 525 [Urine:450; Blood:75]  BLOOD ADMINISTERED:none  DRAINS: Penrose drain in the knee   TOURNIQUET:   Total Tourniquet Time Documented: Thigh (Left) - 75 minutes  DICTATION: .Other Dictation: Dictation Number [id number]  PLAN OF CARE: Admit to inpatient   PATIENT DISPOSITION:  PACU - hemodynamically stable.   Delay start of Pharmacological VTE agent (>24hrs) due to surgical blood loss or risk of bleeding:  {YES/NO/NOT APPLICABLE:20182 RASHAUN CURL, [id number], 10/28/2011, at [facility]   RENDALL Aram Candela, [phone], 10/28/2011, at [facility]  Surgeon: Rendall  Assistant: Duffy  Anesthesia Type: general with femoral n. block  Justification for Surgery:  Pain and worn out knee with 16degrees valgus  Pathology:  no  Procedure Note Dictation number [id number]  RENDALL III,Clint Biello L

## 2011-10-28 NOTE — Anesthesia Postprocedure Evaluation (Signed)
  Anesthesia Post-op Note  Patient: [name]  Procedure(s) Performed:  COMPUTER ASSISTED TOTAL KNEE ARTHROPLASTY - preop femoral nerve block  Patient Location: PACU  Anesthesia Type: General and GA combined with regional for post-op pain  Level of Consciousness: awake and alert   Airway and Oxygen Therapy: Patient Spontanous Breathing  Post-op Pain: mild  Post-op Assessment: Post-op Vital signs reviewed, Patient's Cardiovascular Status Stable, Respiratory Function Stable, Patent Airway and No signs of Nausea or vomiting  Post-op Vital Signs: stable  Complications: No apparent anesthesia complications

## 2011-10-28 NOTE — Interval H&P Note (Signed)
History and Physical Interval Note:   10/28/2011   10:40 AM   [name]  has presented today for surgery, with the diagnosis of Osteoarthritis of the Left Knee  The various methods of treatment have been discussed with the patient and family. After consideration of risks, benefits and other options for treatment, the patient has consented to  Procedure(s): TOTAL KNEE ARTHROPLASTY as a surgical intervention .  The patients' history has been reviewed, patient examined, no change in status, stable for surgery.  I have reviewed the patients' chart and labs.  Questions were answered to the patient's satisfaction.     RENDALL Cresenciano Genre  MD

## 2011-10-28 NOTE — Anesthesia Preprocedure Evaluation (Addendum)
Anesthesia Evaluation  Patient identified by MRN, date of birth, ID band Patient awake    Reviewed: Allergy & Precautions, H&P , NPO status , Patient's Chart, lab work & pertinent test results  Airway Mallampati: II TM Distance: >3 FB Neck ROM: Full    Dental  (+) Edentulous Upper and Edentulous Lower   Pulmonary neg pulmonary ROS, shortness of breath, COPDCurrent Smoker,  clear to auscultation  Pulmonary exam normal       Cardiovascular hypertension, Pt. on medications and Pt. on home beta blockers - anginaneg cardio ROS     Neuro/Psych Seizures -,  PSYCHIATRIC DISORDERS Anxiety Depression Negative Neurological ROS  Negative Psych ROS   GI/Hepatic negative GI ROS, Neg liver ROS, GERD-  Controlled and Medicated,  Endo/Other  Negative Endocrine ROSDiabetes mellitus-, Well Controlled, Type 2, Oral Hypoglycemic Agents  Renal/GU negative Renal ROS  Genitourinary negative   Musculoskeletal negative musculoskeletal ROS (+)   Abdominal Normal abdominal exam  (+)   Peds negative pediatric ROS (+)  Hematology negative hematology ROS (+)   Anesthesia Other Findings   Reproductive/Obstetrics negative OB ROS                         Anesthesia Physical Anesthesia Plan  ASA: III  Anesthesia Plan: General   Post-op Pain Management:    Induction: Intravenous  Airway Management Planned: Oral ETT  Additional Equipment:   Intra-op Plan:   Post-operative Plan:   Informed Consent: I have reviewed the patients History and Physical, chart, labs and discussed the procedure including the risks, benefits and alternatives for the proposed anesthesia with the patient or authorized representative who has indicated his/her understanding and acceptance.   Dental advisory given  Plan Discussed with: CRNA and Surgeon  Anesthesia Plan Comments:         Anesthesia Quick Evaluation

## 2011-10-28 NOTE — Progress Notes (Signed)
Utilization review completed.  

## 2011-10-28 NOTE — Preoperative (Signed)
Beta Blockers   Reason not to administer Beta Blockers:Pt took Bystolic 10-28-11 at 0530

## 2011-10-29 ENCOUNTER — Encounter (HOSPITAL_COMMUNITY): Payer: Self-pay | Admitting: Orthopedic Surgery

## 2011-10-29 LAB — BASIC METABOLIC PANEL
BUN: 12 mg/dL (ref 6–23)
Calcium: 9 mg/dL (ref 8.4–10.5)
GFR calc Af Amer: 75 mL/min — ABNORMAL LOW (ref >90)
GFR calc non Af Amer: 65 mL/min — ABNORMAL LOW (ref >90)
Glucose, Bld: 113 mg/dL — ABNORMAL HIGH (ref 70–99)
Sodium: 137 mEq/L (ref 135–145)

## 2011-10-29 LAB — CBC
HCT: 32.9 % — ABNORMAL LOW (ref 36.0–46.0)
MCHC: 33.1 g/dL (ref 30.0–36.0)
Platelets: 162 10*3/uL (ref 150–400)
RDW: 14.4 % (ref 11.5–15.5)
WBC: 8.3 10*3/uL (ref 4.0–10.5)

## 2011-10-29 MED ORDER — CELECOXIB 200 MG PO CAPS
200.0000 mg | ORAL_CAPSULE | Freq: Every day | ORAL | Status: DC
  Filled 2011-10-29: qty 1

## 2011-10-29 MED ORDER — CELECOXIB 200 MG PO CAPS
200.0000 mg | ORAL_CAPSULE | Freq: Two times a day (BID) | ORAL | Status: DC
  Administered 2011-10-29 – 2011-11-01 (×7): 200 mg via ORAL
  Filled 2011-10-29 (×8): qty 1

## 2011-10-29 MED ORDER — METHOCARBAMOL 100 MG/ML IJ SOLN
500.0000 mg | Freq: Four times a day (QID) | INTRAVENOUS | Status: DC | PRN
  Filled 2011-10-29: qty 5

## 2011-10-29 MED ORDER — METHOCARBAMOL 500 MG PO TABS: ORAL_TABLET | ORAL | Status: DC

## 2011-10-29 MED ORDER — OXYCODONE HCL 10 MG PO TABS: 10.0000 mg | ORAL_TABLET | ORAL | Status: AC | PRN

## 2011-10-29 MED ORDER — RIVAROXABAN 10 MG PO TABS: 10.0000 mg | ORAL_TABLET | Freq: Every day | ORAL | Status: DC

## 2011-10-29 MED ORDER — METHOCARBAMOL 500 MG PO TABS
500.0000 mg | ORAL_TABLET | Freq: Four times a day (QID) | ORAL | Status: DC | PRN
  Administered 2011-10-29 – 2011-11-01 (×9): 500 mg via ORAL
  Filled 2011-10-29 (×9): qty 1

## 2011-10-29 MED ORDER — RIVAROXABAN 10 MG PO TABS
10.0000 mg | ORAL_TABLET | Freq: Every day | ORAL | Status: DC
  Administered 2011-10-29 – 2011-10-31 (×4): 10 mg via ORAL
  Filled 2011-10-29 (×4): qty 1

## 2011-10-29 NOTE — Progress Notes (Signed)
Attempted PT at 10:00.  Pt premedicated with pain MEDS but refusing PT at this time secondary to pain level.  RN aware and providing muscle relaxant.  Will follow.

## 2011-10-29 NOTE — Progress Notes (Signed)
Physical Therapy Evaluation Patient Details Name: [name] MRN: [id number] DOB: 11/07/1956 Today's Date: 10/29/2011 1355-1412 Ev2 Problem List:  Patient Active Problem List  Diagnoses  . Hypotension  . Hypoxemia  . Acute renal failure  . COPD (chronic obstructive pulmonary disease)  . HTN (hypertension)  . Osteoarthritis of left knee    Past Medical History:  Past Medical History  Diagnosis Date  . Dyslipidemia   . Anxiety   . Depression   . COPD (chronic obstructive pulmonary disease)     no inhalers--smoker  . Shortness of breath     sometimes with exertion  . Diabetes mellitus     no meds-diet controlled  . GERD (gastroesophageal reflux disease)   . 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  no seizure since--pt does take keppra and office note from neurologist dr. Marjory Harrington on this chart  . Hypertension 04/19/11    nuclear stress test was done as part of clearance for planned knee surg in sept 2012--negative study.-no ischemia  . Restless leg syndrome    Past Surgical History:  Past Surgical History  Procedure Date  . Laminectomy and diskectomy     L4-5 with fusion  . Knee surgery     right  . Carpal tunnel release     left  . Joint replacement 08/26/2011     right total knee arthroplasty    PT Assessment/Plan/Recommendation PT Assessment Clinical Impression Statement: pt s/p LTKA presents with significant c/o pain, decreased Activity tolerated, can benefit from acute PT to improve functional mobility, ROM, strength to DC to home PT Recommendation/Assessment: Patient will need skilled PT in the acute care venue PT Problem List: Decreased strength;Decreased range of motion;Decreased activity tolerance;Decreased mobility;Decreased knowledge of use of DME;Pain Problem List Comments: pt may need extra day of acute to  achieve goals PT Therapy Diagnosis : Difficulty walking;Acute pain PT Plan PT Frequency: 7X/week PT Treatment/Interventions: DME instruction;Gait training;Stair training;Functional mobility training;Therapeutic exercise;Patient/family education PT Recommendation Recommendations for Other Services: OT consult Follow Up Recommendations: Home health PT;24 hour supervision/assistance Equipment Recommended: None recommended by PT PT Goals  Acute Rehab PT Goals PT Goal Formulation: With patient Time For Goal Achievement: 7 days Pt will go Supine/Side to Sit: with supervision PT Goal: Supine/Side to Sit - Progress: Other (comment) Pt will go Sit to Supine/Side: with supervision;with HOB 0 degrees PT Goal: Sit to Supine/Side - Progress: Progressing toward goal Pt will Transfer Sit to Stand/Stand to Sit: with supervision PT Transfer Goal: Sit to Stand/Stand to Sit - Progress: Progressing toward goal Pt will Ambulate: 51 - 150 feet;with supervision;with rolling walker PT Goal: Ambulate - Progress: Progressing toward goal Pt will Go Up / Down Stairs: 3-5 stairs;with min assist;with least restrictive assistive device PT Goal: Up/Down Stairs - Progress: Progressing toward goal Pt will Demo (or direct caregiver to perform) Pressure Relief x1 min every 30 minutes : with supervision PT Goal: Demo/Caregiver Perform Pressure Relief - Progress: Progressing toward goal  PT Evaluation Precautions/Restrictions  Precautions Precautions: Knee Required Braces or Orthoses: No Restrictions Weight Bearing Restrictions: No Prior Functioning  Home Living Lives With: Spouse Receives Help From: Family Type of Home: House Home Layout: One level Home Access: Stairs to enter Entrance Stairs-Rails: Right Entrance Stairs-Number of Steps: 5 Home Adaptive Equipment: Bedside commode/3-in-1;Walker - rolling;Straight cane Prior Function Level of Independence:  Independent with basic ADLs;Independent with  gait;Independent with transfers Able to Take Stairs?: Yes Cognition Cognition Arousal/Alertness: Awake/alert Overall Cognitive Status: Appears within functional limits for tasks assessed Orientation Level: Oriented X4 Sensation/Coordination Sensation Light Touch: Appears Intact Extremity Assessment RUE Assessment RUE Assessment: Within Functional Limits LUE Assessment LUE Assessment: Within Functional Limits RLE Assessment RLE Assessment: Within Functional Limits LLE Assessment LLE Assessment: Exceptions to WFL LLE PROM (degrees) LLE Overall PROM Comments: pt could not tolerate any PROM due to Pain LLE Strength LLE Overall Strength Comments: Pt did not avtively support LLE Mobility (including Balance) Bed Mobility Bed Mobility: Yes Supine to Sit: 3: Mod assist;HOB elevated (Comment degrees);With rails (50) Supine to Sit Details (indicate cue type and reason): required to cupport LLE throughout Transfers Transfers: Yes Sit to Stand: 1: +2 Total assist;From bed;From elevated surface (pt=50) Stand to Sit: 1: +2 Total assist;To chair/3-in-1;With armrests Stand to Sit Details: PT had to support LLE to sit Stand Pivot Transfers: 1: +2 Total assist (pt= 60 pt place TDWB only on LLE) Stand Pivot Transfer Details (indicate cue type and reason): PT on TDWB on LLE due to Pain Ambulation/Gait Ambulation/Gait: No Stairs: No Wheelchair Mobility Wheelchair Mobility: No  Posture/Postural Control Posture/Postural Control: No significant limitations Exercise    End of Session PT - End of Session Activity Tolerance: Patient limited by pain Patient left: in chair;with call bell in reach Nurse Communication: Mobility status for transfers General Behavior During Session:  (pt emotional due to pain) Cognition: WFL for tasks performed  [name] [phone]/16/2012, 3:21 PM

## 2011-10-29 NOTE — Progress Notes (Signed)
PATIENT ID:      [name]  MRN:     [id number] DOB/AGE:    September 10, 1956 / 55 y.o.    PROGRESS NOTE Subjective:  negative for Chest Pain  negative for Shortness of Breath  negative for Nausea/Vomiting   negative for Calf Pain  negative for Bowel Movement   Tolerating Diet: yes         Patient reports pain as 7 on 0-10 scale.    Objective: Vital signs in last 24 hours:  Patient Vitals for the past 24 hrs:  BP Temp Temp src Pulse Resp SpO2  10/29/11 0456 125/72 mmHg 98.3 F (36.8 C) Oral 70  18  98 %  10/29/11 0406 - - - - 15  96 %  10/29/11 0304 - - - - 16  97 %  10/29/11 0212 145/70 mmHg 98.6 F (37 C) Oral 60  16  98 %  10/28/11 2344 - - - - 18  97 %  10/28/11 2121 159/76 mmHg 98.4 F (36.9 C) Oral 62  16  97 %  10/28/11 1952 - - - - 12  98 %  10/28/11 1730 122/55 mmHg 97.7 F (36.5 C) Oral 53  16  98 %  10/28/11 1630 124/70 mmHg 97.6 F (36.4 C) Oral 52  16  98 %  10/28/11 1600 - - - - 16  95 %  10/28/11 1530 145/75 mmHg 97.4 F (36.3 C) Oral 57  18  96 %  10/28/11 1430 150/75 mmHg 97.7 F (36.5 C) - 56  18  94 %  10/28/11 1429 - - - - 15  95 %  10/28/11 1415 - 97.8 F (36.6 C) - - - -  10/28/11 1412 - - - 56  15  94 %  10/28/11 1411 - - - 55  16  94 %  10/28/11 1410 - - - 56  17  96 %  10/28/11 1409 - - - 56  13  97 %  10/28/11 1408 - - - 57  14  99 %  10/28/11 1407 - - - 57  14  97 %  10/28/11 1406 - - - 56  16  95 %  10/28/11 1405 - - - 56  17  97 %  10/28/11 1404 - - - 57  13  97 %  10/28/11 1403 - - - 57  16  94 %  10/28/11 1402 - - - 57  17  94 %  10/28/11 1401 - - - 58  17  94 %  10/28/11 1400 152/70 mmHg 97.8 F (36.6 C) - 58  18  95 %  10/28/11 1359 - - - 59  14  98 %  10/28/11 1358 - - - 60  15  97 %  10/28/11 1357 - - - 59  15  98 %  10/28/11 1356 - - - 59  18  97 %  10/28/11 1355 - - - 60  15  99 %  10/28/11 1354 - - - 59  16  99 %  10/28/11 1353 - - - 59  15  93 %  10/28/11 1352 - - - 59  17  94 %  10/28/11 1351 - - - 59  17  96 %    10/28/11 1350 - - - 60  13  95 %  10/28/11 1349 - - - 60  17  95 %  10/28/11 1347 - - - 60  17  94 %  10/28/11 1346 - - - 61  18  96 %  10/28/11 1345 150/81 mmHg - - 62  14  97 %  10/28/11 1344 - - - 61  18  97 %  10/28/11 1343 - - - 62  20  100 %  10/28/11 1342 - - - 62  17  100 %  10/28/11 1341 - - - 62  15  100 %  10/28/11 1337 - - - 62  18  100 %  10/28/11 1336 - - - 62  15  100 %  10/28/11 1335 - - - 63  18  98 %  10/28/11 1334 - - - 63  18  100 %  10/28/11 1333 - - - 64  16  100 %  10/28/11 1332 - - - 64  19  100 %  10/28/11 1331 - - - 65  16  100 %  10/28/11 1330 168/85 mmHg - - 65  14  100 %  10/28/11 1329 - - - 65  16  99 %  10/28/11 1328 - - - 65  18  99 %  10/28/11 1327 - - - 65  20  95 %  10/28/11 1323 - - - 68  18  100 %  10/28/11 1322 - - - 70  18  100 %  10/28/11 1321 - - - 71  15  100 %  10/28/11 1320 - - - 71  16  100 %  10/28/11 1319 - - - 72  16  100 %  10/28/11 1318 - - - 72  13  100 %  10/28/11 1317 - - - 73  17  94 %  10/28/11 1316 - - - 72  21  93 %  10/28/11 1315 161/77 mmHg - - 72  18  95 %  10/28/11 1314 - - - 73  17  98 %  10/28/11 1313 - - - 73  19  97 %  10/28/11 1312 - - - 72  17  93 %  10/28/11 1311 - - - 72  18  94 %  10/28/11 1310 - - - 72  - 95 %  10/28/11 1309 171/82 mmHg 97.6 F (36.4 C) - - - -  10/28/11 1042 126/61 mmHg - - 45  12  100 %  10/28/11 1039 - - - 46  16  100 %  10/28/11 1036 - - - 52  15  100 %  10/28/11 1033 - - - 45  14  99 %  10/28/11 1030 - - - 45  14  100 %  10/28/11 1027 - - - 44  13  99 %  10/28/11 1024 - - - 44  14  99 %  10/28/11 0907 164/80 mmHg 97 F (36.1 C) Oral 66  16  98 %      Intake/Output from previous day:   11/15 0701 - 11/16 0700 In: 5026.7 [P.O.:1020; I.V.:3706.7] Out: 2060 [Urine:1950; Drains:35]   Intake/Output this shift:       Intake/Output      11/15 0701 - 11/16 0700 11/16 0701 - 11/17 0700   P.O. 1020    I.V. 3706.7    IV Piggyback 300    Total Intake 5026.7    Urine 1950     Drains 35    Blood 75    Total Output 2060    Net +2966.7            LABORATORY DATA:  Basename 10/29/11 0401 10/26/11 1300  WBC 8.3 7.7  HGB 10.9* 14.2  HCT 32.9* 42.4  PLT 162 198    Basename 10/29/11 0401 10/26/11 1300  NA 137 139  K 4.3 4.0  CL 98 100  CO2 34* 34*  BUN 12 25*  CREATININE 0.97 1.22*  GLUCOSE 113* 87  CALCIUM 9.0 10.0   Lab Results  Component Value Date   INR 1.11 10/26/2011   INR 1.14 08/23/2011    Examination:  General appearance: alert and no distress Resp: clear to auscultation bilaterally Cardio: regular rate and rhythm, S1, S2 normal, no murmur, click, rub or gallop GI: soft, non-tender; bowel sounds normal; no masses,  no organomegaly Extremities: Homans sign is negative, no sign of DVT and hemovac is DC'ed.  Incision with mepiplex intact with minimal DC  Wound Exam: clean, dry, intact   Drainage:  Scant/small amount Serosanguinous exudate  Motor Exam EHL and FHL Intact  Sensory Exam Superficial Peroneal, Deep Peroneal and Tibial normal  Assessment:    1 Day Post-Op  Procedure(s) (LRB): COMPUTER ASSISTED TOTAL KNEE ARTHROPLASTY (Left)  ADDITIONAL DIAGNOSIS:  Active Problems:  * No active hospital problems. *   Acute Blood Loss Anemia, Hypertension and Poor pain control   Plan: Physical Therapy as ordered Weight Bearing as Tolerated (WBAT)  DVT Prophylaxis:  Xarelto, Foot Pumps and TED hose  DISCHARGE PLAN: Home  DISCHARGE NEEDS: HHPT, CPM, Walker and 3-in-1 comode seat  Will DC PCA today and switch to oral oxycodone.  Resume Celebrex.  OOB with PT.  Wean O2.  NSL MIVF with good PO's.  Plan DC home over weekend.    Deryl Giroux 10/29/2011, 8:15 AM

## 2011-10-29 NOTE — Progress Notes (Signed)
CM received referral.  See [facility] note in shadow chart. 

## 2011-10-29 NOTE — Progress Notes (Signed)
Patient c/o severe LLE pain. Pt has had pain meds and muscle relaxer, she refused ice pack. Pt refused to attempt getting OOB, but agreed to gentle ROM to LLE. Pt performed active ankle pumps, and active assisted Hip flexion, hip abduction, and knee flexion to approximately 15 degrees. RN aware. Will follow. Ralene Bathe Kistler PT 10/29/2011  [phone]

## 2011-10-29 NOTE — Discharge Summary (Signed)
Physician Discharge Summary  Patient ID: [name] MRN: [id number] DOB/AGE: November 06, 1956 55 y.o.  Admit date: 10/28/2011 Discharge date: 10/29/2011  Admission Diagnoses Osteoarthritis Left Knee COPD Anxiety/Depression HTN GERD Chronic Pain on Chronic Narcotics HX Seizures  Discharge Diagnoses:  Osteoarthritis L knee s/p L TKA Acute Blood Loss Anemia secondary to Surgery HTN COPD Anxiety/Depression GERD Chronic Pain on Chronic Narcotics Hx of Seizures  Discharged Condition: good  Hospital Course:   Pt tolerated her surgery well and was transferred to the Ortho floor.  She was started on PT per protocol.  Her PCA was DC'ed on POD #1 and she was switched to po oxycodone.  VS were stable. On POD #2 she made slow progress with PT - pain controlled with her oxycodone.  POD #3 she was weak and HGB was low and she was transfused with blood.   On POD #4 she is feeling better. Her constipation will be treated with a laxative and she will be DC'ed home later today after working with PT Consults:  Significant Diagnostic Studies: labs: See progress notes for daily labs  Treatments: IV hydration, antibiotics: Ancef, analgesia: acetaminophen and oxycodone, therapies: PT and OT. Discharge Exam: Blood pressure 125/72, pulse 70, temperature 98.3 F (36.8 C), temperature source Oral, resp. rate 18, SpO2 98.00%. General appearance: alert, cooperative and no distress Head: Normocephalic, without obvious abnormality, atraumatic Eyes: conjunctivae/corneas clear. PERRL, EOM's intact. Fundi benign. Ears: normal TM's and external ear canals both ears Nose: Nares normal. Septum midline. Mucosa normal. No drainage or sinus tenderness. Throat: lips, mucosa, and tongue normal; teeth and gums normal Resp: clear to auscultation bilaterally Cardio: regular rate and rhythm, S1, S2 normal, no murmur, click, rub or gallop GI: soft, non-tender; bowel sounds normal; no masses,  no  organomegaly Extremities: extremities normal, atraumatic, no cyanosis or edema Pulses: 2+ and symmetric Skin: Skin color, texture, turgor normal. No rashes or lesions Neurologic: Alert and oriented X 3, normal strength and tone. Normal symmetric reflexes. Normal coordination and gait Incision/Wound:Left knee incision well approx with staples.  Mild DC.  She has some small blisters over lateral knee.  She is tolerating PT well.  Disposition: Home-Health Care Svc - [facility]   Current Discharge Medication List    CONTINUE these medications which have NOT CHANGED   Details  ALPRAZolam (XANAX) 0.5 MG tablet Take 0.5 mg by mouth 3 (three) times daily as needed. Anxiety     Calcium Carb-Cholecalciferol (OS-CAL 500 + D) 500-600 MG-UNIT TABS Take 1 tablet by mouth daily.     celecoxib (CELEBREX) 200 MG capsule Take 200 mg by mouth daily.     escitalopram (LEXAPRO) 20 MG tablet Take 40 mg by mouth every morning. Pt takes 2 tabs of the 20mg  tablet    esomeprazole (NEXIUM) 40 MG capsule Take 40 mg by mouth daily before breakfast.     fenofibrate micronized (LOFIBRA) 134 MG capsule Take 134 mg by mouth daily before breakfast.     levETIRAcetam (KEPPRA) 250 MG tablet Take 500 mg by mouth 2 (two) times daily.     multivitamin (THERAGRAN) per tablet Take 1 tablet by mouth daily.     nebivolol (BYSTOLIC) 5 MG tablet Take 5 mg by mouth daily.     olmesartan-hydrochlorothiazide (BENICAR HCT) 40-25 MG per tablet Take 1 tablet by mouth every morning.     oxycodone (ROXICODONE) 30 MG immediate release tablet Take 30 mg by mouth 4 (four) times daily.     pregabalin (LYRICA) 50 MG capsule Take  50 mg by mouth 2 (two) times daily.        Follow-up Information    Follow up with Harrington III,Rachel L. Call in 3 days. (make an appt for 11/27 or 11/29)    Contact information:   [name], [facility] [address] [place] [address] [phone]           Signed: Temika Harrington 10/29/2011, 8:31 AM

## 2011-10-30 LAB — CBC
MCH: 30.8 pg (ref 26.0–34.0)
MCHC: 33.1 g/dL (ref 30.0–36.0)
Platelets: 172 10*3/uL (ref 150–400)
RBC: 3.12 MIL/uL — ABNORMAL LOW (ref 3.87–5.11)

## 2011-10-30 NOTE — Progress Notes (Signed)
Cm spoke with pt concerning d/c planning. Pt has DME. Husband to assist in care. HHPt set up with [facility].

## 2011-10-30 NOTE — Progress Notes (Signed)
Physical Therapy Treatment Patient Details Name: [name] MRN: [id number] DOB: February 17, 1956 Today's Date: 10/30/2011 1450-1520  g [place] PT Assessment/Plan  PT - Assessment/Plan Comments on Treatment Session: Pt. tolerated increase with activity at this time, which is encouraging PT Plan: Discharge plan remains appropriate;Frequency remains appropriate PT Frequency: 7X/week Follow Up Recommendations: Home health PT;24 hour supervision/assistance Equipment Recommended: None recommended by OT PT Goals  Acute Rehab PT Goals PT Goal Formulation: With patient Time For Goal Achievement: 7 days Pt will go Supine/Side to Sit: with supervision PT Goal: Supine/Side to Sit - Progress: Partly met Pt will go Sit to Supine/Side: with supervision;with HOB 0 degrees PT Goal: Sit to Supine/Side - Progress: Partly met Pt will Transfer Sit to Stand/Stand to Sit: with supervision PT Transfer Goal: Sit to Stand/Stand to Sit - Progress: Progressing toward goal Pt will Ambulate: 51 - 150 feet;with supervision;with rolling walker PT Goal: Ambulate - Progress: Progressing toward goal  PT Treatment Precautions/Restrictions  Precautions Precautions: Knee Required Braces or Orthoses: No Restrictions Weight Bearing Restrictions: No LLE Weight Bearing: Weight bearing as tolerated Mobility (including Balance) Bed Mobility Bed Mobility: Yes Supine to Sit: 4: Min assist (30) Supine to Sit Details (indicate cue type and reason): PT supported LLE during mobility to EOB Sit to Supine - Left: 4: Min assist;With rail;HOB elevated (comment degrees) (30) Sit to Supine - Left Details (indicate cue type and reason): PT supported LLE Transfers Transfers: Yes Sit to Stand: From elevated surface;From chair/3-in-1;With armrests;With upper extremity assist;From bed Sit to Stand Details (indicate cue type and reason): Pt able to manage LLE during sit/stand to [facility] Stand to Sit: 4: Min assist;To chair/3-in-1;To  bed Ambulation/Gait Ambulation/Gait: Yes Ambulation/Gait Assistance: 4: Min assist Ambulation/Gait Assistance Details (indicate cue type and reason): Pt tolerated much better, improved gait distance Ambulation Distance (Feet): 100 Feet (ambulated first to first to BR) Assistive device: Rolling walker Gait Pattern: Step-to pattern (VC for trying to place L foot flat)    Exercise    End of Session PT - End of Session Activity Tolerance: Patient tolerated treatment well;Patient limited by pain Patient left: in bed;with call bell in reach Nurse Communication: Mobility status for transfers;Mobility status for ambulation General Behavior During Session: [facility] for tasks performed (appears more cheerful)  Rada Hay 10/30/2011, 4:21 PM

## 2011-10-30 NOTE — Progress Notes (Signed)
Physical Therapy Treatment Patient Details Name: [name] MRN: [id number] DOB: Nov 06, 1956 Today's Date: 10/30/2011  PT Assessment/Plan  PT - Assessment/Plan Comments on Treatment Session: Pt. continues with c/o significant pain, is mobilizing today, LLE edema  noted,  progress is slow due to pain. PT Plan: Discharge plan remains appropriate;Frequency remains appropriate (may take extra days for acheiving goals) PT Frequency: 7X/week Follow Up Recommendations: Home health PT;24 hour supervision/assistance Equipment Recommended: None recommended by PT PT Goals  Acute Rehab PT Goals PT Goal Formulation: With patient Time For Goal Achievement: 7 days Pt will go Supine/Side to Sit: with supervision PT Goal: Supine/Side to Sit - Progress: Progressing toward goal Pt will go Sit to Supine/Side: with supervision;with HOB 0 degrees PT Goal: Sit to Supine/Side - Progress: Progressing toward goal Pt will Transfer Sit to Stand/Stand to Sit: with supervision PT Transfer Goal: Sit to Stand/Stand to Sit - Progress: Progressing toward goal Pt will Ambulate: 51 - 150 feet;with supervision;with rolling walker PT Goal: Ambulate - Progress: Progressing toward goal Pt will Go Up / Down Stairs: 3-5 stairs;with min assist;with least restrictive assistive device PT Goal: Up/Down Stairs - Progress: Other (comment) Pt will Demo (or direct caregiver to perform) Pressure Relief x1 min every 30 minutes : with supervision PT Goal: Demo/Caregiver Perform Pressure Relief - Progress: Progressing toward goal  PT Treatment Precautions/Restrictions  Precautions Precautions: Knee Required Braces or Orthoses: No Restrictions Weight Bearing Restrictions: No LLE Weight Bearing: Weight bearing as tolerated Mobility (including Balance) Bed Mobility Bed Mobility: Yes Supine to Sit: 3: Mod assist;HOB elevated (Comment degrees) (at 45) Supine to Sit Details (indicate cue type and reason): support of LLE at all  times Transfers Transfers: Yes Sit to Stand: 4: Min assist;With upper extremity assist;From bed;From elevated surface;From chair/3-in-1 (to near standing position of bed) Stand to Sit: 3: Mod assist;To chair/3-in-1;With armrests Stand to Sit Details: LLE supported throughout as pt sat down due to intolerance of L knee flexion Stand Pivot Transfers: 3: Mod assist Stand Pivot Transfer Details (indicate cue type and reason): pt able to bear a little weight on LLE Ambulation/Gait Ambulation/Gait: Yes Ambulation/Gait Assistance: 3: Mod assist Ambulation/Gait Assistance Details (indicate cue type and reason): VC to increase WBAT on LLE as tolerated Ambulation Distance (Feet): 5 Feet Assistive device: Rolling walker Gait Pattern: Step-to pattern    Exercise  Total Joint Exercises Ankle Circles/Pumps: AROM;Left;Supine Quad Sets: AAROM;Left;Supine Heel Slides: AAROM;Left (pt could not tolerat) Hip ABduction/ADduction: AAROM;Left;10 reps (used sheet to assist) Straight Leg Raises: AAROM;Left;10 reps;Supine (used sheet to assist) End of Session PT - End of Session Activity Tolerance: Patient tolerated treatment well;Patient limited by pain (pt did tolerate more activity today, needs encouragement) Patient left: in chair;with call bell in reach Nurse Communication: Mobility status for transfers;Mobility status for ambulation (RN aware of edemamLLE, TED applied to BLE) General Behavior During Session: [facility] for tasks performed (pt expresses concern that pain is so bad this surgery) Cognition: WFL for tasks performed  Rada Hay 10/30/2011, 9:22 AM

## 2011-10-30 NOTE — Progress Notes (Signed)
Occupational Therapy Evaluation Patient Details Name: [name] MRN: [id number] DOB: 1956-07-25 Today's Date: 10/30/2011  Problem List:  Patient Active Problem List  Diagnoses  . Hypotension  . Hypoxemia  . Acute renal failure  . COPD (chronic obstructive pulmonary disease)  . HTN (hypertension)  . Osteoarthritis of left knee    Past Medical History:  Past Medical History  Diagnosis Date  . Dyslipidemia   . Anxiety   . Depression   . COPD (chronic obstructive pulmonary disease)     no inhalers--smoker  . Shortness of breath     sometimes with exertion  . Diabetes mellitus     no meds-diet controlled  . GERD (gastroesophageal reflux disease)   . 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  no seizure since--pt does take keppra and office note from neurologist dr. Marjory Lies on this chart  . Hypertension 04/19/11    nuclear stress test was done as part of clearance for planned knee surg in sept 2012--negative study.-no ischemia  . Restless leg syndrome    Past Surgical History:  Past Surgical History  Procedure Date  . Laminectomy and diskectomy     L4-5 with fusion  . Knee surgery     right  . Carpal tunnel release     left  . Joint replacement 08/26/2011     right total knee arthroplasty  . Knee arthroplasty 10/28/2011    Procedure: COMPUTER ASSISTED TOTAL KNEE ARTHROPLASTY;  Surgeon: Carlisle Beers Rendall III;  Location: [facility] ORS;  Service: Orthopedics;  Laterality: Left;  preop femoral nerve block    OT Assessment/Plan/Recommendation OT Assessment Clinical Impression Statement: This 55 y.o. female presents to OT s/p Lt. TKA, and recent Rt. TKA.  Pt. moving slowly due to pain Lt. knee.  Has all DME, and will have 24 hour assistance at home. Recommend OT to maximize safety and independence wtih BADLs to allow pt. to return home  with family. OT Recommendation/Assessment: Patient will need skilled OT in the acute care venue OT Problem List: Decreased strength;Decreased activity tolerance;Pain Barriers to Discharge: None OT Therapy Diagnosis : Generalized weakness OT Plan OT Frequency: Min 2X/week OT Treatment/Interventions: Self-care/ADL training;DME and/or AE instruction;Therapeutic activities;Patient/family education OT Recommendation Recommendations for Other Services: PT consult (PT seeing pt.) Follow Up Recommendations: None Equipment Recommended: None recommended by OT (Has DME) Individuals Consulted Consulted and Agree with Results and Recommendations: Patient OT Goals Acute Rehab OT Goals OT Goal Formulation: With patient Time For Goal Achievement: 7 days ADL Goals Pt Will Perform Grooming: with supervision;Standing at sink Pt Will Perform Lower Body Bathing: with supervision;Sit to stand from chair;Sit to stand from bed Pt Will Perform Lower Body Dressing: with supervision;Sit to stand from chair;Sit to stand from bed Pt Will Transfer to Toilet: with supervision;Ambulation;3-in-1 Pt Will Perform Toileting - Clothing Manipulation: with supervision;Standing  OT Evaluation Precautions/Restrictions  Precautions Precautions: Knee Required Braces or Orthoses: No Restrictions Weight Bearing Restrictions: No LLE Weight Bearing: Weight bearing as tolerated Prior Functioning Home Living Bathroom Shower/Tub: Tub/shower unit;Curtain Bathroom Toilet: Standard Bathroom Accessibility: Yes How Accessible: Accessible via walker (has small step into BR) Additional Comments: Pt. reports husband and son will provide assistance as needed at D/C Prior Function Level of Independence: Independent with homemaking with ambulation Driving: Yes Vocation: On disability ADL ADL Eating/Feeding: Simulated;Independent Where Assessed - Eating/Feeding: Chair  Grooming: Simulated;Wash/dry hands;Wash/dry face;Minimal  assistance Where Assessed - Grooming: Standing at sink Upper Body Bathing: Simulated;Set up Where Assessed - Upper Body Bathing: Sitting, chair Lower Body Bathing: Simulated;Minimal assistance Lower Body Bathing Details (indicate cue type and reason): Requires encouragement.  Frequently states she can't due to pain Where Assessed - Lower Body Bathing: Sit to stand from chair Upper Body Dressing: Simulated;Set up Where Assessed - Upper Body Dressing: Sitting, chair Lower Body Dressing: Simulated;Performed;Minimal assistance (Performed socks) Lower Body Dressing Details (indicate cue type and reason): Requires encouragement, states "I can't" Where Assessed - Lower Body Dressing: Sit to stand from chair Toilet Transfer: Simulated;Minimal assistance Toilet Transfer Method: Stand pivot Toilet Transfer Equipment: Bedside commode Toileting - Clothing Manipulation: Simulated;Minimal assistance Toileting - Clothing Manipulation Details (indicate cue type and reason): Requires encouragement.  Able to pull gown up in standing with min A for balance Where Assessed - Toileting Clothing Manipulation: Sit to stand from 3-in-1 or toilet Toileting - Hygiene: Simulated;Supervision/safety Where Assessed - Toileting Hygiene: Sit on 3-in-1 or toilet Tub/Shower Transfer: Not assessed (Pt. reports she will sponge bathe until able to step over tu) ADL Comments: Pt. crying at times during session stating the Lt. knee hurts worse than Rt. ever did; however, when enouragement provided, pt. demonstrates ability to move sit to stand with min A, take 2 steps forward and back with min A, and simulate ADLs with min A Vision/Perception    Cognition Cognition Arousal/Alertness: Awake/alert Overall Cognitive Status: Appears within functional limits for tasks assessed Orientation Level: Oriented X4 Sensation/Coordination Coordination Gross Motor Movements are Fluid and Coordinated: Yes Fine Motor Movements are Fluid and  Coordinated: Yes Extremity Assessment RUE Assessment RUE Assessment: Within Functional Limits LUE Assessment LUE Assessment: Within Functional Limits Mobility  Bed Mobility Bed Mobility: Yes Supine to Sit: 3: Mod assist;HOB elevated (Comment degrees) (at 45) Supine to Sit Details (indicate cue type and reason): support of LLE at all times Transfers Transfers: Yes Sit to Stand: 4: Min assist Sit to Stand Details (indicate cue type and reason): cues for hand placement and encouragement to participate Stand to Sit: 4: Min assist Stand to Sit Details: LLE supported throughout as pt sat down due to intolerance of L knee flexion Exercises TEnd of Session OT - End of Session Equipment Utilized During Treatment:  (RW) Activity Tolerance: Patient limited by pain Patient left: in chair General Behavior During Session: Other (comment) Cognition:  (pt. tearful.  Requires encouragement)   Jeani Hawking M 10/30/2011, 10:17 AM

## 2011-10-30 NOTE — Progress Notes (Signed)
PATIENT ID:      [name]  MRN:     [id number] DOB/AGE:    Oct 08, 1956 / 55 y.o.    PROGRESS NOTE Subjective:  negative for Chest Pain  negative for Shortness of Breath  negative for Nausea/Vomiting   negative for Calf Pain  positive for Bowel Movement   Tolerating Diet: yes         Patient reports pain as moderate.    Objective: Vital signs in last 24 hours:  Patient Vitals for the past 24 hrs:  BP Temp Temp src Pulse Resp SpO2  10/30/11 0525 160/87 mmHg 99.3 F (37.4 C) Oral 78  18  93 %  10/29/11 2100 167/84 mmHg 99.4 F (37.4 C) Oral 69  20  95 %  10/29/11 1442 192/89 mmHg 98.4 F (36.9 C) Oral 57  16  94 %  10/29/11 1355 - - - 61  - 98 %      Intake/Output from previous day:   11/16 0701 - 11/17 0700 In: 3753.3 [P.O.:960; I.V.:2793.3] Out: 6525 [Urine:6525]   Intake/Output this shift:   11/17 0701 - 11/17 1900 In: 360 [P.O.:360] Out: 426 [Urine:425]   Intake/Output      11/16 0701 - 11/17 0700 11/17 0701 - 11/18 0700   P.O. 960 360   I.V. 2793.3    IV Piggyback     Total Intake 3753.3 360   Urine 6525 425   Drains     Stool  1   Blood     Total Output 6525 426   Net -2771.7 -66           LABORATORY DATA:  Basename 10/30/11 0420 10/29/11 0401 10/26/11 1300  WBC 9.8 8.3 7.7  HGB 9.6* 10.9* 14.2  HCT 29.0* 32.9* 42.4  PLT 172 162 198    Basename 10/29/11 0401 10/26/11 1300  NA 137 139  K 4.3 4.0  CL 98 100  CO2 34* 34*  BUN 12 25*  CREATININE 0.97 1.22*  GLUCOSE 113* 87  CALCIUM 9.0 10.0   Lab Results  Component Value Date   INR 1.11 10/26/2011   INR 1.14 08/23/2011    Examination:  General appearance: alert, cooperative and moderate distress Resp: clear to auscultation bilaterally Cardio: regular rate and rhythm, S1, S2 normal, no murmur, click, rub or gallop GI: soft, non-tender; bowel sounds normal; no masses,  no organomegaly Extremities: extremities normal, atraumatic, no cyanosis or edema  Wound Exam: clean, dry, intact or  ecchymotic   Drainage:  Scant/small amount Serosanguinous exudate  Motor Exam EHL, FHL, Anterior Tibial and Posterior Tibial Intact  Sensory Exam Superficial Peroneal, Deep Peroneal and Tibial normal  Assessment:    2 Days Post-Op  Procedure(s) (LRB): COMPUTER ASSISTED TOTAL KNEE ARTHROPLASTY (Left)  ADDITIONAL DIAGNOSIS:  Active Problems:  * No active hospital problems. *   Acute Blood Loss Anemia and Hypertension   Plan: Physical Therapy as ordered Weight Bearing as Tolerated (WBAT)  DVT Prophylaxis:  Xarelto and TED hose  DISCHARGE PLAN: Home  DISCHARGE NEEDS: Walker and 3-in-1 comode seat         WHITFIELD, PETER W 10/30/2011, 12:14 PM

## 2011-10-31 LAB — CBC
Platelets: 177 10*3/uL (ref 150–400)
RBC: 2.78 MIL/uL — ABNORMAL LOW (ref 3.87–5.11)
RDW: 13.9 % (ref 11.5–15.5)
WBC: 7.9 10*3/uL (ref 4.0–10.5)

## 2011-10-31 MED ORDER — NICOTINE 21 MG/24HR TD PT24
21.0000 mg | MEDICATED_PATCH | Freq: Every day | TRANSDERMAL | Status: DC
  Administered 2011-10-31 – 2011-11-01 (×2): 21 mg via TRANSDERMAL
  Filled 2011-10-31 (×2): qty 1

## 2011-10-31 NOTE — Progress Notes (Signed)
Physical Therapy Treatment Patient Details Name: [name] MRN: [id number] DOB: 1956-02-02 Today's Date: 10/31/2011 14:15-14:30. te  PT Assessment/Plan  PT - Assessment/Plan Comments on Treatment Session: Pt receiving 1st unit of blood and BP 100/62.  Rn requested to see pt in bed.  Pt able to perform with A.  Lethargic throughout rx.  Will need stair training tomorrow. PT Plan: Discharge plan remains appropriate;Frequency remains appropriate PT Frequency: 7X/week Follow Up Recommendations: Home health PT;24 hour supervision/assistance Equipment Recommended: None recommended by PT PT Goals  Acute Rehab PT Goals Time For Goal Achievement: 7 days PT Goal: Supine/Side to Sit - Progress: Other (comment) (not addressed secondary to BP and receiving blood.) PT Goal: Sit to Supine/Side - Progress:  (not addressed) PT Transfer Goal: Sit to Stand/Stand to Sit - Progress:  (not addressed) PT Goal: Ambulate - Progress:  (not addressed) PT Goal: Up/Down Stairs - Progress:  (not addressed)  PT Treatment Precautions/Restrictions  Precautions Precautions: Knee Required Braces or Orthoses: No Restrictions Weight Bearing Restrictions: No LLE Weight Bearing: Weight bearing as tolerated    Exercise  Total Joint Exercises Ankle Circles/Pumps: AROM;Left;Supine Quad Sets: Strengthening;10 reps;Left Heel Slides: AAROM;Left Hip ABduction/ADduction: Left;AAROM;10 reps Straight Leg Raises: AAROM;Left;10 reps;Supine Knee Flexion: Seated;AAROM;Left;10 reps End of Session PT - End of Session Equipment Utilized During Treatment: Gait belt Activity Tolerance: Other (comment) (Pt getting blood and BP 100/62. Rn requested exercises only.) Patient left: in bed;with call bell in reach Nurse Communication:  (Pt's BP) General Behavior During Session: Lethargic Cognition: WFL for tasks performed  [facility] [name] 10/31/2011, 2:48 PM

## 2011-10-31 NOTE — Progress Notes (Signed)
Physical Therapy Treatment Patient Details Name: [name] MRN: [id number] DOB: 07-26-1956 Today's Date: 10/31/2011 9:55-10:25, te, gt  PT Assessment/Plan  PT - Assessment/Plan Comments on Treatment Session: Pt was able to increase gait distance.  She was agreeable to try stairs for afternoon treatment. PT Plan: Discharge plan remains appropriate;Frequency remains appropriate PT Frequency: 7X/week Follow Up Recommendations: Home health PT;24 hour supervision/assistance Equipment Recommended: Small-based quad cane PT Goals  Acute Rehab PT Goals PT Transfer Goal: Sit to Stand/Stand to Sit - Progress: Progressing toward goal PT Goal: Ambulate - Progress: Progressing toward goal  PT Treatment Precautions/Restrictions  Precautions Precautions: Knee Required Braces or Orthoses: No Restrictions Weight Bearing Restrictions: No LLE Weight Bearing: Weight bearing as tolerated Mobility (including Balance) Transfers Sit to Stand: 5: Supervision (from [facility] and recliner) Stand to Sit: 4: Min assist (min/guard) Stand to Sit Details: PT A with L LE Ambulation/Gait Ambulation/Gait Assistance: 4: Min assist (MIN/guard) Ambulation/Gait Assistance Details (indicate cue type and reason): cues to hold head up.  Pt trying to increase WB Ambulation Distance (Feet): 150 Feet Assistive device: Rolling walker Gait Pattern: Step-to pattern    Exercise  Total Joint Exercises Ankle Circles/Pumps: AROM;Left;Supine Quad Sets: Strengthening;10 reps;Left Hip ABduction/ADduction: Left;AAROM;10 reps Straight Leg Raises: AAROM;Left;10 reps;Supine Knee Flexion: Seated;AAROM;Left;10 reps End of Session PT - End of Session Equipment Utilized During Treatment: Gait belt Activity Tolerance: Patient tolerated treatment well Patient left: in chair;with call bell in reach General Behavior During Session: [facility] for tasks performed Cognition: [facility] for tasks performed  [facility] [name] 10/31/2011, 11:24  AM

## 2011-10-31 NOTE — Progress Notes (Signed)
  PATIENT ID:      [name]  MRN:     [id number] DOB/AGE:    1956/06/26 / 55 y.o.    PROGRESS NOTE Subjective:  negative for Chest Pain  negative for Shortness of Breath  negative for Nausea/Vomiting   negative for Calf Pain  positive for Bowel Movement   Tolerating Diet: yes         Patient reports pain as moderate.    Objective: Vital signs in last 24 hours:  Patient Vitals for the past 24 hrs:  BP Temp Temp src Pulse Resp SpO2 Height Weight  10/31/11 0652 104/63 mmHg 98 F (36.7 C) Oral 60  19  93 % - -  10/31/11 0504 - - - - - - 5\' 6"  (1.676 m) 78.472 kg (173 lb)  10/30/11 2225 158/81 mmHg 98.5 F (36.9 C) Oral 74  20  94 % - -  10/30/11 1300 160/83 mmHg 98.3 F (36.8 C) Oral 64  16  93 % - -      Intake/Output from previous day:   11/17 0701 - 11/18 0700 In: 2100 [P.O.:1700; I.V.:400] Out: 2526 [Urine:2525]   Intake/Output this shift:       Intake/Output      11/17 0701 - 11/18 0700 11/18 0701 - 11/19 0700   P.O. 1700    I.V. (mL/kg) 400 (5.1)    Total Intake(mL/kg) 2100 (26.8)    Urine (mL/kg/hr) 2525 (1.3)    Stool 1    Total Output 2526    Net -426            LABORATORY DATA:  Basename 10/31/11 0524 10/30/11 0420 10/29/11 0401 10/26/11 1300  WBC 7.9 9.8 8.3 7.7  HGB 8.7* 9.6* 10.9* 14.2  HCT 25.8* 29.0* 32.9* 42.4  PLT 177 172 162 198    Basename 10/29/11 0401 10/26/11 1300  NA 137 139  K 4.3 4.0  CL 98 100  CO2 34* 34*  BUN 12 25*  CREATININE 0.97 1.22*  GLUCOSE 113* 87  CALCIUM 9.0 10.0   Lab Results  Component Value Date   INR 1.11 10/26/2011   INR 1.14 08/23/2011    Examination:  General appearance: alert, cooperative, fatigued, moderate distress and pale Resp: clear to auscultation bilaterally Cardio: regular rate and rhythm, S1, S2 normal, no murmur, click, rub or gallop GI: soft, non-tender; bowel sounds normal; no masses,  no organomegaly Extremities: extremities normal, atraumatic, no cyanosis or edema, edema  and Homans  sign is negative, no sign of DVT  Wound Exam: draining   Drainage:  Scant/small amount Serosanguinous exudate  Motor Exam EHL, FHL, Anterior Tibial and Posterior Tibial Intact  Sensory Exam Superficial Peroneal, Deep Peroneal and Tibial normal  Assessment:    3 Days Post-Op  Procedure(s) (LRB): COMPUTER ASSISTED TOTAL KNEE ARTHROPLASTY (Left)  ADDITIONAL DIAGNOSIS:  Active Problems:  * No active hospital problems. *   Acute Blood Loss Anemia-will transfuse,pale, weak   Plan: Physical Therapy as ordered Weight Bearing as Tolerated (WBAT)  DVT Prophylaxis:  Xarelto  DISCHARGE PLAN: Home  DISCHARGE NEEDS: HHPT, CPM, Walker and 3-in-1 comode seat         [name] W 10/31/2011, 10:43 AM

## 2011-11-01 LAB — TYPE AND SCREEN

## 2011-11-01 LAB — CBC
Platelets: 173 10*3/uL (ref 150–400)
RBC: 2.93 MIL/uL — ABNORMAL LOW (ref 3.87–5.11)
WBC: 7 10*3/uL (ref 4.0–10.5)

## 2011-11-01 MED ORDER — NICOTINE 21 MG/24HR TD PT24: 1.0000 | MEDICATED_PATCH | Freq: Every day | TRANSDERMAL | Status: AC

## 2011-11-01 MED ORDER — FLEET ENEMA 7-19 GM/118ML RE ENEM: 1.0000 | ENEMA | Freq: Every day | RECTAL | Status: DC | PRN

## 2011-11-01 MED ORDER — BISACODYL 10 MG RE SUPP
10.0000 mg | Freq: Once | RECTAL | Status: AC
  Administered 2011-11-01: 10 mg via RECTAL
  Filled 2011-11-01: qty 1

## 2011-11-01 NOTE — Progress Notes (Signed)
PATIENT ID:      [name]  MRN:     [id number] DOB/AGE:    Oct 08, 1956 / 55 y.o.    PROGRESS NOTE Subjective:  negative for Chest Pain  negative for Shortness of Breath  negative for Nausea/Vomiting   negative for Calf Pain  negative for Bowel Movement - has voided without difficulty   Tolerating Diet: yes         Patient reports pain as 3 on 0-10 scale.    Objective: Vital signs in last 24 hours:  Patient Vitals for the past 24 hrs:  BP Temp Temp src Pulse Resp SpO2  11/01/11 0610 101/61 mmHg 97.4 F (36.3 C) Oral 55  16  95 %  10/31/11 2130 110/64 mmHg 98.8 F (37.1 C) Oral 63  17  96 %  10/31/11 1910 121/71 mmHg 99.2 F (37.3 C) Oral 62  18  96 %  10/31/11 1845 108/69 mmHg 98.9 F (37.2 C) Oral 62  18  96 %  10/31/11 1745 114/72 mmHg 98 F (36.7 C) Oral 59  16  98 %  10/31/11 1645 104/63 mmHg 98.4 F (36.9 C) Oral 58  16  97 %  10/31/11 1600 112/73 mmHg 98.2 F (36.8 C) Oral 59  18  97 %  10/31/11 1515 103/63 mmHg 98 F (36.7 C) Oral 55  16  97 %  10/31/11 1500 104/63 mmHg 98.1 F (36.7 C) Axillary 58  16  98 %  10/31/11 1415 100/62 mmHg 98.1 F (36.7 C) Oral 58  16  97 %  10/31/11 1315 97/57 mmHg 97.5 F (36.4 C) Oral 60  16  97 %  10/31/11 1237 112/66 mmHg 98.5 F (36.9 C) Oral 61  16  94 %      Intake/Output from previous day:   11/18 0701 - 11/19 0700 In: 2440 [P.O.:1240; I.V.:500] Out: 1400 [Urine:1400]   Intake/Output this shift:       Intake/Output      11/18 0701 - 11/19 0700 11/19 0701 - 11/20 0700   P.O. 1240    I.V. (mL/kg) 500 (6.4)    Blood 700    Total Intake(mL/kg) 2440 (31.1)    Urine (mL/kg/hr) 1400 (0.7)    Stool     Total Output 1400    Net +1040            LABORATORY DATA:  Basename 11/01/11 0403 10/31/11 0524 10/30/11 0420 10/29/11 0401 10/26/11 1300  WBC 7.0 7.9 9.8 8.3 7.7  HGB 9.4* 8.7* 9.6* 10.9* 14.2  HCT 26.9* 25.8* 29.0* 32.9* 42.4  PLT 173 177 172 162 198    Basename 10/29/11 0401 10/26/11 1300  NA 137 139    K 4.3 4.0  CL 98 100  CO2 34* 34*  BUN 12 25*  CREATININE 0.97 1.22*  GLUCOSE 113* 87  CALCIUM 9.0 10.0   Lab Results  Component Value Date   INR 1.11 10/26/2011   INR 1.14 08/23/2011    Examination:  General appearance: alert, cooperative and no distress Resp: clear to auscultation bilaterally Cardio: regular rate and rhythm, S1, S2 normal, no murmur, click, rub or gallop GI: soft, non-tender; bowel sounds normal; no masses,  no organomegaly Extremities: extremities normal, atraumatic, no cyanosis or edema except Left knee  Wound Exam: clean, dry, intact .  Few small blisters over lateral knee.  Mod edema of leg.  Drainage:  None: wound tissue dry  Motor Exam EHL, FHL, Anterior Tibial, Posterior Tibial, Quadriceps and  Hamstrings Intact  Sensory Exam Superficial Peroneal, Deep Peroneal and Tibial normal  Assessment:    4 Days Post-Op  Procedure(s) (LRB): COMPUTER ASSISTED TOTAL KNEE ARTHROPLASTY (Left)  ADDITIONAL DIAGNOSIS:   Acute Blood Loss Anemia and constipation   Plan: Physical Therapy as ordered Weight Bearing as Tolerated (WBAT)  DVT Prophylaxis:  Xarelto, Foot Pumps and TED hose  DISCHARGE PLAN: Home  DISCHARGE NEEDS: HHPT, CPM, Walker and 3-in-1 comode seat.  Will plan to DC home later today after has BM and works with PT.           Arnoldo Morale 11/01/2011, 7:46 AM

## 2011-11-01 NOTE — Progress Notes (Signed)
Pt discharged to home in stable condition via private vehicle accompanied by friend. Pt with receive home health physical therapy provided by [facility], all discharge instructions given, questions answered, pt has prescriptions. VSS.

## 2011-11-01 NOTE — Progress Notes (Signed)
Physical Therapy Treatment Patient Details Name: [name] MRN: [id number] DOB: [birth year] Today's Date: 11/01/2011 9:40-10:14, te, gt PT Assessment/Plan  PT - Assessment/Plan Comments on Treatment Session: Pt did well with gait and stair training.  Per MD notes, pt ok to go home after PT session. PT Plan: Discharge plan remains appropriate;Frequency remains appropriate PT Frequency: 7X/week Follow Up Recommendations: Home health PT;24 hour supervision/assistance Equipment Recommended: None recommended by PT PT Goals  Acute Rehab PT Goals PT Goal: Supine/Side to Sit - Progress: Progressing toward goal PT Transfer Goal: Sit to Stand/Stand to Sit - Progress: Met PT Goal: Ambulate - Progress: Met PT Goal: Up/Down Stairs - Progress: Met  PT Treatment Precautions/Restrictions  Precautions Precautions: Knee Required Braces or Orthoses: No Restrictions Weight Bearing Restrictions: Yes LLE Weight Bearing: Weight bearing as tolerated Mobility (including Balance) Bed Mobility Supine to Sit: 4: Min assist Supine to Sit Details (indicate cue type and reason): MIN A at end of transfer for LE.  Used sheet for 75% of transfer to hold LE. Transfers Sit to Stand: 5: Supervision Stand to Sit: 5: Supervision Ambulation/Gait Ambulation/Gait Assistance: 5: Supervision Ambulation/Gait Assistance Details (indicate cue type and reason): cues to hold head up Ambulation Distance (Feet): 175 Feet Assistive device: Rolling walker Gait Pattern: Step-to pattern (with increased WB through L LE) Stairs: Yes Stairs Assistance: 4: Min assist (Min/guard) Stairs Assistance Details (indicate cue type and reason): Pt with good recall on sequencing.  Fearful with descent but did well. Stair Management Technique: With crutches;One rail Left;One rail Right (rail and crutch, L going up, R going down) Number of Stairs: 4     Exercise  Total Joint Exercises Ankle Circles/Pumps: AROM;Left;Supine Quad Sets:  Strengthening;10 reps;Left Heel Slides: AAROM;Left;10 reps Hip ABduction/ADduction: Left;AAROM;10 reps Straight Leg Raises: AAROM;Left;10 reps;Supine End of Session PT - End of Session Equipment Utilized During Treatment: Gait belt Activity Tolerance: Patient tolerated treatment well Patient left: in chair;with call bell in reach Nurse Communication: Mobility status for transfers;Mobility status for ambulation General Behavior During Session: [facility] for tasks performed Cognition: [facility] for tasks performed  [facility] [name] 11/01/2011, 11:03 AM

## 2011-12-21 ENCOUNTER — Ambulatory Visit: Payer: Medicare Other | Attending: Orthopedic Surgery | Admitting: Physical Therapy

## 2011-12-21 DIAGNOSIS — IMO0001 Reserved for inherently not codable concepts without codable children: Secondary | ICD-10-CM | POA: Insufficient documentation

## 2011-12-21 DIAGNOSIS — M25669 Stiffness of unspecified knee, not elsewhere classified: Secondary | ICD-10-CM | POA: Insufficient documentation

## 2011-12-21 DIAGNOSIS — T8489XA Other specified complication of internal orthopedic prosthetic devices, implants and grafts, initial encounter: Secondary | ICD-10-CM | POA: Insufficient documentation

## 2011-12-21 DIAGNOSIS — Z96659 Presence of unspecified artificial knee joint: Secondary | ICD-10-CM | POA: Insufficient documentation

## 2011-12-21 DIAGNOSIS — M25569 Pain in unspecified knee: Secondary | ICD-10-CM | POA: Insufficient documentation

## 2011-12-21 DIAGNOSIS — R262 Difficulty in walking, not elsewhere classified: Secondary | ICD-10-CM | POA: Insufficient documentation

## 2011-12-21 DIAGNOSIS — Y831 Surgical operation with implant of artificial internal device as the cause of abnormal reaction of the patient, or of later complication, without mention of misadventure at the time of the procedure: Secondary | ICD-10-CM | POA: Insufficient documentation

## 2011-12-24 ENCOUNTER — Ambulatory Visit: Payer: Medicare Other | Admitting: Rehabilitation

## 2011-12-29 ENCOUNTER — Ambulatory Visit: Payer: Medicare Other | Admitting: Physical Therapy

## 2011-12-30 ENCOUNTER — Ambulatory Visit: Payer: Medicare Other | Admitting: Rehabilitation

## 2012-01-03 ENCOUNTER — Encounter: Payer: Medicare Other | Admitting: Rehabilitation

## 2012-01-04 ENCOUNTER — Ambulatory Visit: Payer: Medicare Other | Admitting: Rehabilitation

## 2012-01-05 ENCOUNTER — Encounter: Payer: Medicare Other | Admitting: Rehabilitation

## 2012-01-11 ENCOUNTER — Ambulatory Visit: Payer: Medicare Other | Admitting: Rehabilitation

## 2012-01-13 ENCOUNTER — Ambulatory Visit: Payer: Medicare Other | Admitting: Rehabilitation

## 2012-01-19 ENCOUNTER — Ambulatory Visit: Payer: Medicare Other | Attending: Orthopedic Surgery | Admitting: Rehabilitation

## 2012-01-19 DIAGNOSIS — IMO0001 Reserved for inherently not codable concepts without codable children: Secondary | ICD-10-CM | POA: Insufficient documentation

## 2012-01-19 DIAGNOSIS — M6281 Muscle weakness (generalized): Secondary | ICD-10-CM | POA: Insufficient documentation

## 2012-01-19 DIAGNOSIS — M25669 Stiffness of unspecified knee, not elsewhere classified: Secondary | ICD-10-CM | POA: Insufficient documentation

## 2012-01-19 DIAGNOSIS — M25569 Pain in unspecified knee: Secondary | ICD-10-CM | POA: Insufficient documentation

## 2012-01-19 DIAGNOSIS — R262 Difficulty in walking, not elsewhere classified: Secondary | ICD-10-CM | POA: Insufficient documentation

## 2012-01-25 ENCOUNTER — Ambulatory Visit: Payer: Medicare Other | Admitting: Physical Therapy

## 2012-01-25 ENCOUNTER — Ambulatory Visit: Payer: Medicare Other

## 2012-01-28 ENCOUNTER — Ambulatory Visit: Payer: Medicare Other | Admitting: Rehabilitation

## 2012-01-28 ENCOUNTER — Ambulatory Visit
Admission: RE | Admit: 2012-01-28 | Discharge: 2012-01-28 | Disposition: A | Discharge status: Home / Self Care | Payer: Medicare Other | Source: Ambulatory Visit | Attending: Obstetrics and Gynecology | Admitting: Obstetrics and Gynecology

## 2012-01-28 DIAGNOSIS — Z1231 Encounter for screening mammogram for malignant neoplasm of breast: Secondary | ICD-10-CM

## 2012-02-02 ENCOUNTER — Encounter: Payer: Medicare Other | Admitting: Rehabilitation

## 2012-02-04 ENCOUNTER — Ambulatory Visit: Payer: Medicare Other | Admitting: Rehabilitation

## 2012-02-07 ENCOUNTER — Ambulatory Visit: Payer: Medicare Other | Admitting: Rehabilitation

## 2012-02-08 ENCOUNTER — Ambulatory Visit: Payer: Medicare Other | Admitting: Physical Therapy

## 2012-02-09 ENCOUNTER — Encounter: Payer: Medicare Other | Admitting: Rehabilitation

## 2012-02-15 ENCOUNTER — Ambulatory Visit: Payer: Medicare Other | Attending: Orthopedic Surgery | Admitting: Rehabilitation

## 2012-02-15 DIAGNOSIS — IMO0001 Reserved for inherently not codable concepts without codable children: Secondary | ICD-10-CM | POA: Insufficient documentation

## 2012-02-15 DIAGNOSIS — R262 Difficulty in walking, not elsewhere classified: Secondary | ICD-10-CM | POA: Insufficient documentation

## 2012-02-15 DIAGNOSIS — M25669 Stiffness of unspecified knee, not elsewhere classified: Secondary | ICD-10-CM | POA: Insufficient documentation

## 2012-02-15 DIAGNOSIS — M25569 Pain in unspecified knee: Secondary | ICD-10-CM | POA: Insufficient documentation

## 2012-02-15 DIAGNOSIS — Z96659 Presence of unspecified artificial knee joint: Secondary | ICD-10-CM | POA: Insufficient documentation

## 2012-02-15 DIAGNOSIS — Y831 Surgical operation with implant of artificial internal device as the cause of abnormal reaction of the patient, or of later complication, without mention of misadventure at the time of the procedure: Secondary | ICD-10-CM | POA: Insufficient documentation

## 2012-02-15 DIAGNOSIS — T8489XA Other specified complication of internal orthopedic prosthetic devices, implants and grafts, initial encounter: Secondary | ICD-10-CM | POA: Insufficient documentation

## 2012-02-16 ENCOUNTER — Ambulatory Visit: Payer: Medicare Other | Admitting: Physical Therapy

## 2012-02-22 ENCOUNTER — Ambulatory Visit: Payer: Medicare Other | Admitting: Physical Therapy

## 2012-02-24 ENCOUNTER — Ambulatory Visit: Payer: Medicare Other | Admitting: Rehabilitation

## 2012-02-29 ENCOUNTER — Encounter: Payer: Medicare Other | Admitting: Rehabilitation

## 2012-03-01 ENCOUNTER — Ambulatory Visit: Payer: Medicare Other | Admitting: Rehabilitation

## 2012-03-02 ENCOUNTER — Encounter: Payer: Medicare Other | Admitting: Rehabilitation

## 2012-03-07 ENCOUNTER — Encounter: Payer: Medicare Other | Admitting: Physical Therapy

## 2012-03-09 ENCOUNTER — Encounter: Payer: Medicare Other | Admitting: Physical Therapy

## 2012-05-12 ENCOUNTER — Ambulatory Visit: Payer: Medicare Other | Admitting: Medical

## 2012-07-09 ENCOUNTER — Emergency Department (HOSPITAL_COMMUNITY): Payer: Medicare Other

## 2012-07-09 ENCOUNTER — Emergency Department (HOSPITAL_COMMUNITY)
Admission: EM | Admit: 2012-07-09 | Discharge: 2012-07-09 | Disposition: A | Discharge status: Home / Self Care | Payer: Medicare Other | Attending: Emergency Medicine | Admitting: Emergency Medicine

## 2012-07-09 ENCOUNTER — Encounter (HOSPITAL_COMMUNITY): Payer: Self-pay | Admitting: Emergency Medicine

## 2012-07-09 DIAGNOSIS — W3400XA Accidental discharge from unspecified firearms or gun, initial encounter: Secondary | ICD-10-CM | POA: Insufficient documentation

## 2012-07-09 DIAGNOSIS — S0180XA Unspecified open wound of other part of head, initial encounter: Secondary | ICD-10-CM

## 2012-07-09 DIAGNOSIS — S41009A Unspecified open wound of unspecified shoulder, initial encounter: Secondary | ICD-10-CM | POA: Insufficient documentation

## 2012-07-09 DIAGNOSIS — S0183XA Puncture wound without foreign body of other part of head, initial encounter: Secondary | ICD-10-CM

## 2012-07-09 DIAGNOSIS — S41031A Puncture wound without foreign body of right shoulder, initial encounter: Secondary | ICD-10-CM

## 2012-07-09 DIAGNOSIS — R062 Wheezing: Secondary | ICD-10-CM | POA: Insufficient documentation

## 2012-07-09 DIAGNOSIS — J3489 Other specified disorders of nose and nasal sinuses: Secondary | ICD-10-CM | POA: Insufficient documentation

## 2012-07-09 DIAGNOSIS — M5030 Other cervical disc degeneration, unspecified cervical region: Secondary | ICD-10-CM | POA: Insufficient documentation

## 2012-07-09 DIAGNOSIS — S0990XA Unspecified injury of head, initial encounter: Secondary | ICD-10-CM | POA: Insufficient documentation

## 2012-07-09 HISTORY — DX: Hyperlipidemia, unspecified: E78.5

## 2012-07-09 HISTORY — DX: Unspecified osteoarthritis, unspecified site: M19.90

## 2012-07-09 LAB — URINALYSIS, MICROSCOPIC ONLY
Bilirubin Urine: NEGATIVE
Hgb urine dipstick: NEGATIVE
Nitrite: NEGATIVE
Specific Gravity, Urine: 1.012 (ref 1.005–1.030)
pH: 6 (ref 5.0–8.0)

## 2012-07-09 LAB — POCT I-STAT, CHEM 8
BUN: 18 mg/dL (ref 6–23)
Calcium, Ion: 1.12 mmol/L (ref 1.12–1.23)
TCO2: 31 mmol/L (ref 0–100)

## 2012-07-09 LAB — TYPE AND SCREEN
ABO/RH(D): A POS
Unit division: 0

## 2012-07-09 LAB — CBC
HCT: 34.8 % — ABNORMAL LOW (ref 39.0–52.0)
Hemoglobin: 12.2 g/dL — ABNORMAL LOW (ref 13.0–17.0)
MCV: 94.6 fL (ref 78.0–100.0)
Platelets: 156 10*3/uL (ref 150–400)
RBC: 3.68 MIL/uL — ABNORMAL LOW (ref 4.22–5.81)
WBC: 7.2 10*3/uL (ref 4.0–10.5)

## 2012-07-09 LAB — COMPREHENSIVE METABOLIC PANEL
Albumin: 2.9 g/dL — ABNORMAL LOW (ref 3.5–5.2)
BUN: 17 mg/dL (ref 6–23)
Creatinine, Ser: 1.2 mg/dL (ref 0.50–1.35)
Total Protein: 5.8 g/dL — ABNORMAL LOW (ref 6.0–8.3)

## 2012-07-09 LAB — PROTIME-INR: INR: 1.08 (ref 0.00–1.49)

## 2012-07-09 MED ORDER — CEFAZOLIN SODIUM 1-5 GM-% IV SOLN
1.0000 g | Freq: Once | INTRAVENOUS | Status: AC
  Administered 2012-07-09: 1 g via INTRAVENOUS
  Filled 2012-07-09: qty 50

## 2012-07-09 MED ORDER — CEPHALEXIN 500 MG PO CAPS: 500.0000 mg | ORAL_CAPSULE | Freq: Four times a day (QID) | ORAL | Status: AC

## 2012-07-09 MED ORDER — TETANUS-DIPHTHERIA TOXOIDS TD 5-2 LFU IM INJ
0.5000 mL | INJECTION | Freq: Once | INTRAMUSCULAR | Status: AC
  Administered 2012-07-09: 0.5 mL via INTRAMUSCULAR
  Filled 2012-07-09: qty 0.5

## 2012-07-09 MED ORDER — SODIUM CHLORIDE 0.9 % IV BOLUS (SEPSIS)
1000.0000 mL | Freq: Once | INTRAVENOUS | Status: AC
  Administered 2012-07-09: 1000 mL via INTRAVENOUS

## 2012-07-09 MED ORDER — OXYCODONE HCL 5 MG PO TABS: 5.0000 mg | ORAL_TABLET | ORAL | Status: AC | PRN

## 2012-07-09 MED ORDER — POTASSIUM CHLORIDE CRYS ER 20 MEQ PO TBCR
40.0000 meq | EXTENDED_RELEASE_TABLET | Freq: Once | ORAL | Status: AC
  Administered 2012-07-09: 40 meq via ORAL
  Filled 2012-07-09: qty 2

## 2012-07-09 MED ORDER — FENTANYL CITRATE 0.05 MG/ML IJ SOLN
50.0000 ug | Freq: Once | INTRAMUSCULAR | Status: AC
  Administered 2012-07-09: 50 ug via INTRAVENOUS
  Filled 2012-07-09: qty 2
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
## 2012-07-09 NOTE — ED Notes (Signed)
Pt ambulated without difficulty, denied dizziness
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
## 2012-07-09 NOTE — ED Notes (Signed)
Family at beside  

## 2012-07-09 NOTE — ED Notes (Signed)
CSI at bedside.
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
## 2012-07-09 NOTE — ED Notes (Signed)
pts blood pressure noted to drop when sat up to check posterior aspect of back and shoulder

## 2012-07-09 NOTE — ED Notes (Addendum)
Pt back to room from CT and Xray, remains on monitor, pts O2 sats 92% on RA pt with hx of COPD, pt placed on 2L of O2 via [place] for comfort, pt denies sob however. Dr Magnus Ivan at bedside to update pt on poc.

## 2012-07-09 NOTE — ED Notes (Signed)
Patient via stretcher with RN to CT

## 2012-07-10 ENCOUNTER — Encounter (HOSPITAL_COMMUNITY): Payer: Self-pay | Admitting: Orthopedic Surgery

## 2012-09-22 ENCOUNTER — Ambulatory Visit
Admission: RE | Admit: 2012-09-22 | Discharge: 2012-09-22 | Disposition: A | Discharge status: Home / Self Care | Payer: Medicare Other | Source: Ambulatory Visit | Attending: Family Medicine | Admitting: Family Medicine

## 2012-09-22 ENCOUNTER — Other Ambulatory Visit: Payer: Self-pay | Admitting: Family Medicine

## 2012-09-22 DIAGNOSIS — R0902 Hypoxemia: Secondary | ICD-10-CM

## 2012-09-22 DIAGNOSIS — R0600 Dyspnea, unspecified: Secondary | ICD-10-CM

## 2012-10-02 ENCOUNTER — Ambulatory Visit: Payer: Medicare Other | Admitting: Physical Therapy

## 2012-10-10 ENCOUNTER — Ambulatory Visit: Payer: Medicare Other | Admitting: Rehabilitative and Restorative Service Providers"

## 2012-12-25 ENCOUNTER — Other Ambulatory Visit (HOSPITAL_COMMUNITY): Payer: Self-pay | Admitting: Obstetrics and Gynecology

## 2012-12-25 DIAGNOSIS — Z1231 Encounter for screening mammogram for malignant neoplasm of breast: Secondary | ICD-10-CM

## 2013-01-10 ENCOUNTER — Emergency Department (HOSPITAL_COMMUNITY): Payer: PRIVATE HEALTH INSURANCE

## 2013-01-10 ENCOUNTER — Encounter (HOSPITAL_COMMUNITY): Payer: Self-pay

## 2013-01-10 ENCOUNTER — Inpatient Hospital Stay (HOSPITAL_COMMUNITY)
Admission: EM | Admit: 2013-01-10 | Discharge: 2013-01-13 | DRG: 291 | Disposition: A | Discharge status: Home Health | Payer: PRIVATE HEALTH INSURANCE | Attending: Internal Medicine | Admitting: Internal Medicine

## 2013-01-10 DIAGNOSIS — F3289 Other specified depressive episodes: Secondary | ICD-10-CM | POA: Diagnosis present

## 2013-01-10 DIAGNOSIS — R0902 Hypoxemia: Secondary | ICD-10-CM

## 2013-01-10 DIAGNOSIS — M129 Arthropathy, unspecified: Secondary | ICD-10-CM | POA: Diagnosis present

## 2013-01-10 DIAGNOSIS — J189 Pneumonia, unspecified organism: Secondary | ICD-10-CM

## 2013-01-10 DIAGNOSIS — E785 Hyperlipidemia, unspecified: Secondary | ICD-10-CM | POA: Diagnosis present

## 2013-01-10 DIAGNOSIS — F411 Generalized anxiety disorder: Secondary | ICD-10-CM | POA: Diagnosis present

## 2013-01-10 DIAGNOSIS — I509 Heart failure, unspecified: Secondary | ICD-10-CM | POA: Diagnosis present

## 2013-01-10 DIAGNOSIS — J9600 Acute respiratory failure, unspecified whether with hypoxia or hypercapnia: Secondary | ICD-10-CM | POA: Diagnosis present

## 2013-01-10 DIAGNOSIS — I5033 Acute on chronic diastolic (congestive) heart failure: Secondary | ICD-10-CM | POA: Insufficient documentation

## 2013-01-10 DIAGNOSIS — K219 Gastro-esophageal reflux disease without esophagitis: Secondary | ICD-10-CM | POA: Diagnosis present

## 2013-01-10 DIAGNOSIS — F17200 Nicotine dependence, unspecified, uncomplicated: Secondary | ICD-10-CM | POA: Diagnosis present

## 2013-01-10 DIAGNOSIS — I498 Other specified cardiac arrhythmias: Secondary | ICD-10-CM | POA: Diagnosis present

## 2013-01-10 DIAGNOSIS — Z96659 Presence of unspecified artificial knee joint: Secondary | ICD-10-CM

## 2013-01-10 DIAGNOSIS — J441 Chronic obstructive pulmonary disease with (acute) exacerbation: Secondary | ICD-10-CM

## 2013-01-10 DIAGNOSIS — J9601 Acute respiratory failure with hypoxia: Secondary | ICD-10-CM

## 2013-01-10 DIAGNOSIS — M1712 Unilateral primary osteoarthritis, left knee: Secondary | ICD-10-CM

## 2013-01-10 DIAGNOSIS — J449 Chronic obstructive pulmonary disease, unspecified: Secondary | ICD-10-CM | POA: Diagnosis present

## 2013-01-10 DIAGNOSIS — I10 Essential (primary) hypertension: Secondary | ICD-10-CM | POA: Diagnosis present

## 2013-01-10 DIAGNOSIS — F329 Major depressive disorder, single episode, unspecified: Secondary | ICD-10-CM | POA: Diagnosis present

## 2013-01-10 DIAGNOSIS — E119 Type 2 diabetes mellitus without complications: Secondary | ICD-10-CM

## 2013-01-10 DIAGNOSIS — R000 Tachycardia, unspecified: Secondary | ICD-10-CM

## 2013-01-10 DIAGNOSIS — G40909 Epilepsy, unspecified, not intractable, without status epilepticus: Secondary | ICD-10-CM | POA: Diagnosis present

## 2013-01-10 DIAGNOSIS — N179 Acute kidney failure, unspecified: Secondary | ICD-10-CM

## 2013-01-10 DIAGNOSIS — G2581 Restless legs syndrome: Secondary | ICD-10-CM | POA: Diagnosis present

## 2013-01-10 DIAGNOSIS — E876 Hypokalemia: Secondary | ICD-10-CM

## 2013-01-10 DIAGNOSIS — Z7982 Long term (current) use of aspirin: Secondary | ICD-10-CM

## 2013-01-10 DIAGNOSIS — Z79899 Other long term (current) drug therapy: Secondary | ICD-10-CM

## 2013-01-10 HISTORY — DX: Pneumonia, unspecified organism: J18.9

## 2013-01-10 LAB — CBC
Hemoglobin: 14.5 g/dL (ref 12.0–15.0)
MCH: 31.8 pg (ref 26.0–34.0)
MCHC: 33.1 g/dL (ref 30.0–36.0)
Platelets: 135 10*3/uL — ABNORMAL LOW (ref 150–400)
RDW: 15.1 % (ref 11.5–15.5)

## 2013-01-10 LAB — BASIC METABOLIC PANEL
Calcium: 9.2 mg/dL (ref 8.4–10.5)
GFR calc Af Amer: >90 mL/min (ref >90)
GFR calc non Af Amer: >90 mL/min (ref >90)
Glucose, Bld: 103 mg/dL — ABNORMAL HIGH (ref 70–99)
Sodium: 140 mEq/L (ref 135–145)

## 2013-01-10 LAB — POCT I-STAT TROPONIN I

## 2013-01-10 MED ORDER — LEVOFLOXACIN IN D5W 750 MG/150ML IV SOLN
750.0000 mg | INTRAVENOUS | Status: DC
  Administered 2013-01-11 – 2013-01-12 (×3): 750 mg via INTRAVENOUS
  Filled 2013-01-10 (×3): qty 150

## 2013-01-10 MED ORDER — ENOXAPARIN SODIUM 40 MG/0.4ML ~~LOC~~ SOLN
40.0000 mg | SUBCUTANEOUS | Status: DC
  Administered 2013-01-11 – 2013-01-13 (×3): 40 mg via SUBCUTANEOUS
  Filled 2013-01-10 (×4): qty 0.4

## 2013-01-10 MED ORDER — FUROSEMIDE 10 MG/ML IJ SOLN
40.0000 mg | Freq: Every day | INTRAMUSCULAR | Status: DC
  Filled 2013-01-10: qty 4

## 2013-01-10 MED ORDER — IPRATROPIUM BROMIDE 0.02 % IN SOLN
0.5000 mg | Freq: Four times a day (QID) | RESPIRATORY_TRACT | Status: DC
  Filled 2013-01-10: qty 2.5

## 2013-01-10 MED ORDER — ALPRAZOLAM 0.5 MG PO TABS
0.5000 mg | ORAL_TABLET | Freq: Three times a day (TID) | ORAL | Status: DC | PRN
  Administered 2013-01-11 – 2013-01-13 (×4): 0.5 mg via ORAL
  Filled 2013-01-10: qty 1
  Filled 2013-01-10 (×2): qty 2
  Filled 2013-01-10: qty 1

## 2013-01-10 MED ORDER — METHYLPREDNISOLONE SODIUM SUCC 40 MG IJ SOLR
40.0000 mg | Freq: Two times a day (BID) | INTRAMUSCULAR | Status: DC
  Filled 2013-01-10 (×2): qty 1

## 2013-01-10 MED ORDER — OXYCODONE HCL 5 MG PO TABS
5.0000 mg | ORAL_TABLET | ORAL | Status: DC | PRN
  Administered 2013-01-10: 5 mg via ORAL
  Filled 2013-01-10: qty 1

## 2013-01-10 MED ORDER — FUROSEMIDE 10 MG/ML IJ SOLN
40.0000 mg | Freq: Once | INTRAMUSCULAR | Status: AC
  Administered 2013-01-10: 40 mg via INTRAVENOUS
  Filled 2013-01-10: qty 4

## 2013-01-10 MED ORDER — SODIUM CHLORIDE 0.9 % IV SOLN
250.0000 mL | INTRAVENOUS | Status: DC | PRN
  Administered 2013-01-11 – 2013-01-12 (×2): 250 mL via INTRAVENOUS

## 2013-01-10 MED ORDER — ASPIRIN EC 81 MG PO TBEC
81.0000 mg | DELAYED_RELEASE_TABLET | Freq: Every day | ORAL | Status: DC
  Administered 2013-01-11 – 2013-01-13 (×3): 81 mg via ORAL
  Filled 2013-01-10 (×3): qty 1

## 2013-01-10 MED ORDER — METHYLPREDNISOLONE SODIUM SUCC 125 MG IJ SOLR
125.0000 mg | Freq: Once | INTRAMUSCULAR | Status: AC
  Administered 2013-01-10: 125 mg via INTRAVENOUS
  Filled 2013-01-10: qty 2

## 2013-01-10 MED ORDER — SODIUM CHLORIDE 0.9 % IJ SOLN
3.0000 mL | Freq: Two times a day (BID) | INTRAMUSCULAR | Status: DC
  Administered 2013-01-11 – 2013-01-12 (×3): 3 mL via INTRAVENOUS

## 2013-01-10 MED ORDER — ALBUTEROL SULFATE (5 MG/ML) 0.5% IN NEBU: 2.5000 mg | INHALATION_SOLUTION | RESPIRATORY_TRACT | Status: DC | PRN

## 2013-01-10 MED ORDER — DEXTROSE 5 % IV SOLN
1.0000 g | INTRAVENOUS | Status: DC
  Administered 2013-01-11: 1 g via INTRAVENOUS
  Filled 2013-01-10: qty 10

## 2013-01-10 MED ORDER — INSULIN ASPART 100 UNIT/ML ~~LOC~~ SOLN
0.0000 [IU] | Freq: Three times a day (TID) | SUBCUTANEOUS | Status: DC
  Administered 2013-01-11: 1 [IU] via SUBCUTANEOUS

## 2013-01-10 MED ORDER — PANTOPRAZOLE SODIUM 40 MG PO TBEC
40.0000 mg | DELAYED_RELEASE_TABLET | Freq: Every day | ORAL | Status: DC
  Administered 2013-01-11 – 2013-01-13 (×3): 40 mg via ORAL
  Filled 2013-01-10 (×3): qty 1

## 2013-01-10 MED ORDER — ROPINIROLE HCL 1 MG PO TABS
2.0000 mg | ORAL_TABLET | Freq: Every day | ORAL | Status: DC
  Administered 2013-01-11 – 2013-01-12 (×3): 2 mg via ORAL
  Filled 2013-01-10 (×4): qty 2

## 2013-01-10 MED ORDER — ALBUTEROL SULFATE (5 MG/ML) 0.5% IN NEBU
2.5000 mg | INHALATION_SOLUTION | Freq: Four times a day (QID) | RESPIRATORY_TRACT | Status: DC
  Filled 2013-01-10: qty 0.5

## 2013-01-10 MED ORDER — PREGABALIN 25 MG PO CAPS
50.0000 mg | ORAL_CAPSULE | Freq: Two times a day (BID) | ORAL | Status: DC
  Administered 2013-01-11 – 2013-01-13 (×6): 50 mg via ORAL
  Filled 2013-01-10 (×6): qty 2

## 2013-01-10 MED ORDER — ESCITALOPRAM OXALATE 20 MG PO TABS
40.0000 mg | ORAL_TABLET | Freq: Every day | ORAL | Status: DC
  Administered 2013-01-11 – 2013-01-13 (×3): 40 mg via ORAL
  Filled 2013-01-10 (×3): qty 2

## 2013-01-10 MED ORDER — SODIUM CHLORIDE 0.9 % IJ SOLN
3.0000 mL | INTRAMUSCULAR | Status: DC | PRN
  Administered 2013-01-11 – 2013-01-12 (×2): 3 mL via INTRAVENOUS

## 2013-01-10 MED ORDER — ALBUTEROL (5 MG/ML) CONTINUOUS INHALATION SOLN
10.0000 mg/h | INHALATION_SOLUTION | Freq: Once | RESPIRATORY_TRACT | Status: AC
  Administered 2013-01-10: 10 mg/h via RESPIRATORY_TRACT
  Filled 2013-01-10: qty 20

## 2013-01-10 MED ORDER — ONDANSETRON HCL 4 MG/2ML IJ SOLN: 4.0000 mg | Freq: Four times a day (QID) | INTRAMUSCULAR | Status: DC | PRN

## 2013-01-10 MED ORDER — LORATADINE 10 MG PO TABS
10.0000 mg | ORAL_TABLET | Freq: Every day | ORAL | Status: DC
  Administered 2013-01-11 – 2013-01-13 (×3): 10 mg via ORAL
  Filled 2013-01-10 (×3): qty 1

## 2013-01-10 MED ORDER — METHOCARBAMOL 500 MG PO TABS
500.0000 mg | ORAL_TABLET | Freq: Four times a day (QID) | ORAL | Status: DC | PRN
  Administered 2013-01-10 – 2013-01-12 (×3): 500 mg via ORAL
  Filled 2013-01-10 (×4): qty 1

## 2013-01-10 MED ORDER — IRBESARTAN 300 MG PO TABS
300.0000 mg | ORAL_TABLET | Freq: Every day | ORAL | Status: DC
  Administered 2013-01-11 – 2013-01-13 (×3): 300 mg via ORAL
  Filled 2013-01-10 (×3): qty 1

## 2013-01-10 MED ORDER — NEBIVOLOL HCL 5 MG PO TABS
5.0000 mg | ORAL_TABLET | Freq: Every day | ORAL | Status: DC
  Administered 2013-01-11 – 2013-01-13 (×3): 5 mg via ORAL
  Filled 2013-01-10 (×4): qty 1

## 2013-01-10 NOTE — ED Notes (Signed)
Continuous neb completed  

## 2013-01-10 NOTE — H&P (Signed)
History and Physical  [name] ZOX:[id number] DOB: 1956-08-21 DOA: 01/10/2013  Referring physician: Stevie Kern, MD PCP: Ailene Ravel, MD   Chief Complaint: Short of breath  HPI:  57 year old woman with history COPD (not oxygen dependent) and current smoking presented with hypoxia and shortness of breath. Initial evaluation suggested COPD exacerbation, possible pneumonia, possible pulmonary edema.  Symptoms began 3 days prior to presentation with shortness of breath and productive cough, congestion. No headache. Shortness of breath worse with exertion or lying flat. No significant lower remedy edema. Was seen by primary care physician today found to be hypoxic with saturation in the 60s and sent to the emergency department. Did receive a flu shot this year.  Upon arrival to the emergency department, O2 sats 68% on RA. Improved to 96% on 4L [place]. Afebrile, tachypneic, systolic blood pressure 180-200. CBC and basic metabolic panel unremarkable, point-of-care troponin negative, BNP 3842. Chest x-ray--early interstitial edema, cannot exclude interstitial pneumonia. Treated with albuterol, Lasix and Solu-Medrol in the emergency department  Review of Systems:  Negative for visual changes, sore throat, rash, new muscle aches, chest pain, SOB, dysuria, bleeding, n/v/abdominal pain.  Low grade fever only.  Past Medical History  Diagnosis Date  . Dyslipidemia   . Anxiety   . Depression   . COPD (chronic obstructive pulmonary disease)     no inhalers--smoker  . Shortness of breath     sometimes with exertion  . Diabetes mellitus     no meds-diet controlled  . GERD (gastroesophageal reflux disease)   . 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  no seizure since--pt does take keppra and office note from neurologist dr. Marjory Lies on this chart  .  Hypertension 04/19/11    nuclear stress test was done as part of clearance for planned knee surg in sept 2012--negative study.-no ischemia  . Restless leg syndrome   . Hypertension   . Hyperlipidemia   . Arthritis   . COPD (chronic obstructive pulmonary disease)     Past Surgical History  Procedure Date  . Laminectomy and diskectomy     L4-5 with fusion  . Knee surgery     right  . Carpal tunnel release     left  . Joint replacement 08/26/2011     right total knee arthroplasty  . Knee arthroplasty 10/28/2011    Procedure: COMPUTER ASSISTED TOTAL KNEE ARTHROPLASTY;  Surgeon: Carlisle Beers Rendall III;  Location: [facility]S;  Service: Orthopedics;  Laterality: Left;  preop femoral nerve block  . Total knee arthroplasty     Social History:  reports that she has been smoking Cigarettes.  She has a 40 pack-year smoking history. She does not have any smokeless tobacco history on file. She reports that she drinks alcohol. She reports that she does not use illicit drugs.  Allergies  Allergen Reactions  . Acetaminophen   . Aspirin   . Nsaids Other (See Comments)    Pt states it messes up her kidneys  . Vicodin (Hydrocodone-Acetaminophen) Nausea And Vomiting    No family history on file.   Prior to Admission medications   Medication Sig Start Date End Date Taking? Authorizing Provider  albuterol (PROVENTIL HFA;VENTOLIN HFA) 108 (90 BASE) MCG/ACT inhaler Inhale 2 puffs into the lungs every 6 (six) hours as needed. For shortness of breath/wheezing.   Yes Historical  Provider, MD  ALPRAZolam Prudy Feeler) 0.5 MG tablet Take 0.5 mg by mouth 3 (three) times daily as needed. Anxiety    Yes Historical Provider, MD  aspirin EC 81 MG tablet Take 81 mg by mouth daily.   Yes Historical Provider, MD  Calcium Carb-Cholecalciferol (OS-CAL 500 + D) 500-600 MG-UNIT TABS Take 1 tablet by mouth daily.    Yes Historical Provider, MD  escitalopram (LEXAPRO) 20 MG tablet Take 40 mg by mouth daily.   Yes Historical  Provider, MD  esomeprazole (NEXIUM) 40 MG capsule Take 40 mg by mouth daily before breakfast.    Yes Historical Provider, MD  fenofibrate micronized (LOFIBRA) 134 MG capsule Take 134 mg by mouth daily before breakfast.    Yes Historical Provider, MD  fexofenadine (ALLEGRA) 180 MG tablet Take 180 mg by mouth daily.   Yes Historical Provider, MD  methocarbamol (ROBAXIN) 500 MG tablet Take 1-2 tabs po Q6hrprn spasms 10/29/11  Yes Arnoldo Morale, PA  Multiple Vitamin (MULTIVITAMIN WITH MINERALS) TABS Take 1 tablet by mouth daily.   Yes Historical Provider, MD  multivitamin [facility]) per tablet Take 1 tablet by mouth daily.    Yes Historical Provider, MD  nebivolol (BYSTOLIC) 5 MG tablet Take 5 mg by mouth daily.    Yes Historical Provider, MD  olmesartan (BENICAR) 40 MG tablet Take 40 mg by mouth daily.   Yes Historical Provider, MD  pregabalin (LYRICA) 50 MG capsule Take 50 mg by mouth 2 (two) times daily.    Yes Historical Provider, MD  rOPINIRole (REQUIP) 1 MG tablet Take 2 mg by mouth at bedtime.   Yes Historical Provider, MD   Physical Exam: Filed Vitals:   01/10/13 1925 01/10/13 1930 01/10/13 1945 01/10/13 2015  BP:  190/84 118/96 200/72  Pulse:  61 73 85  Temp:      TempSrc:      Resp:  18 22 27   SpO2: 93% 97% 96% 94%   General:  Examined in the emergency department. Appears calm, mildly uncomfortable  Eyes: PERRL, normal lids, irises ENT: grossly normal hearing, lips & tongue Neck: no LAD, masses or thyromegaly Cardiovascular: RRR, no m/r/g. No LE edema. Respiratory:  coarse breath sounds, diffuse wheezes/rhonchi anteriorly. Posteriorly lungs are clear, no rales at the bases . Normal respiratory effort. Abdomen: soft, ntnd Skin: no rash or induration seen  Musculoskeletal: grossly normal tone BUE/BLE Psychiatric: grossly normal mood and affect, speech fluent and appropriate Neurologic: grossly non-focal.  Wt Readings from Last 3 Encounters:  10/31/11 78.472 kg (173 lb)   10/31/11 78.472 kg (173 lb)  10/26/11 80.015 kg (176 lb 6.4 oz)    Labs on Admission:  Basic Metabolic Panel:  Lab 01/10/13 3244  NA 140  K 3.9  CL 99  CO2 31  GLUCOSE 103*  BUN 16  CREATININE 0.68  CALCIUM 9.2  MG --  PHOS --   CBC:  Lab 01/10/13 1709  WBC 5.1  NEUTROABS --  HGB 14.5  HCT 43.8  MCV 96.1  PLT 135*     Basename 01/10/13 1729  TROPIPOC 0.00    BNP (last 3 results)  Basename 01/10/13 1856  PROBNP 3842.0*   Radiological Exams on Admission: Dg Chest 2 View (if Patient Has Fever And/or Copd)  01/10/2013  *RADIOLOGY REPORT*  Clinical Data: Short of breath.  Chest pain.  Hypertension.  CHEST - 2 VIEW  Comparison: 09/22/2012  Findings: Heart size is at the upper limits of normal.  Mediastinal shadows are normal.  There  are increased interstitial markings in a pattern suggesting early interstitial edema.  Interstitial pneumonitis is possible but felt less likely.  No consolidation or major collapse.  No effusions.  IMPRESSION: Increased interstitial density felt represent early interstitial edema.  Interstitial pneumonia not excluded but not favored.   Original Report Authenticated By: Paulina Fusi, M.D.     EKG: Independently reviewed.  Pending.    Principal Problem:  *Acute respiratory failure with hypoxia Active Problems:  COPD (chronic obstructive pulmonary disease)  HTN (hypertension)  COPD exacerbation  Community acquired pneumonia  Diabetes mellitus, type 2  Acute CHF   Assessment/Plan 1. Acute respiratory failure with hypoxia: Improved with IV Lasix, albuterol and oxygen in the emergency department. Suspect multifactorial as below. 2. COPD exacerbation: Steroids, oxygen, nebulizers, antibiotics. 3. Suspected Community acquired pneumonia: IV antibiotics. 4. Possible CHF: As suggested by x-ray and elevated BNP, however not clearly a case although she has responded somewhat to Lasix. LVEF 55-60% by echocardiogram 03/2010. Check 2-D  echocardiogram. No signs or symptoms to suggest acute coronary syndrome. 5. Uncontrolled HTN: Improved with treatment in emergency department. Monitor. 6. Diabetes mellitus diet controlled: Stable. Sliding scale insulin. 7. GERD: PPI. 8. History of seizure disorder: By report secondary to hypertensive encephalopathy in setting of narcotic withdrawal. Await med reconciliation to identify medication. 9. Restless leg syndrome: Continue Requip 10. Anxiety, depression: 11. Cigarette smoker: Recommend cessation.    Code Status: Full code Family Communication: none present Disposition Plan/Anticipated LOS: admit inpatient, 3-4 days  Time spent: 60 minutes  Brendia Sacks, MD  [facility] Pager [phone] 01/10/2013, 8:32 PM

## 2013-01-10 NOTE — ED Notes (Addendum)
Pt was seen at Dr. Miguel Rota office today. O2 sat 61 % pre-neb and O2. With albuterol neb 2.5 mg and 4 LPM [place] came up to 92 %. Sats would not stay above 83% when oxygen discontinued. COPD pt with coarse rhonchi on exam. Placed on 4 LPM in triage with sats maintaining at 93 %. Denies any pain. Pt states she has had a productive cough with yellow and white sputum. Pt is not on home O2. Room sats here 68 %.

## 2013-01-10 NOTE — ED Provider Notes (Signed)
History     CSN: [id number]  Arrival date & time 01/10/13  1619   First MD Initiated Contact with Patient 01/10/13 1747      Chief Complaint  Patient presents with  . Respiratory Distress  . Cough    (Consider location/radiation/quality/duration/timing/severity/associated sxs/prior treatment) HPI Comments: 57 y/o F h/o COPD, DM, Sz p/w SOB and cough. Congestion and rhinorrhea >1 week. Cough x3-4 days. Productive white/yellow sputum. SOB x2 days. Worse with exertion and laying lfat. Went to PCP. Found to be hypoxic to 60's. Given breathing treatment. Patient reports no significant change. Upon arrival, O2 sats 68% on RA. Improved to 96% on 4L [place]. Does not use home O2. Patient is a 57 y.o. female presenting with shortness of breath. The history is provided by the patient.  Shortness of Breath  The current episode started 2 days ago. The onset was gradual. The problem occurs continuously. The problem has been gradually worsening. The problem is moderate. The symptoms are relieved by rest. The symptoms are aggravated by a supine position and activity. Associated symptoms include chest pain (diffuse chest pain "from all the coughing"), orthopnea, rhinorrhea, cough, shortness of breath and wheezing. Pertinent negatives include no fever.    Past Medical History  Diagnosis Date  . Dyslipidemia   . Anxiety   . Depression   . COPD (chronic obstructive pulmonary disease)     no inhalers--smoker, no oxygen  . Shortness of breath     sometimes with exertion  . Diabetes mellitus     no meds-diet controlled  . GERD (gastroesophageal reflux disease)   . 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  no seizure since--pt does take keppra and office note from neurologist dr. Marjory Lies on this chart  . Hypertension 04/19/11    nuclear stress test was done as  part of clearance for planned knee surg in sept 2012--negative study.-no ischemia  . Restless leg syndrome   . Hypertension   . Hyperlipidemia   . Arthritis   . COPD (chronic obstructive pulmonary disease)     Past Surgical History  Procedure Date  . Laminectomy and diskectomy     L4-5 with fusion  . Knee surgery     right  . Carpal tunnel release     left  . Joint replacement 08/26/2011     right total knee arthroplasty  . Knee arthroplasty 10/28/2011    Procedure: COMPUTER ASSISTED TOTAL KNEE ARTHROPLASTY;  Surgeon: Carlisle Beers Rendall III;  Location: [facility];  Service: Orthopedics;  Laterality: Left;  preop femoral nerve block  . Total knee arthroplasty     Family History  Problem Relation Age of Onset  . Heart attack Father     History  Substance Use Topics  . Smoking status: Current Every Day Smoker -- 1.0 packs/day for 40 years    Types: Cigarettes  . Smokeless tobacco: Not on file  . Alcohol Use: Yes     Comment: ocasionally    OB History    Grav Para Term Preterm Abortions TAB SAB Ect Mult Living                  Review of Systems  Constitutional: Negative for fever and chills.  HENT: Positive for congestion and rhinorrhea. Negative for neck pain.   Eyes: Negative for pain and visual disturbance.  Respiratory: Positive for cough, shortness of breath and wheezing.   Cardiovascular: Positive for chest pain (diffuse chest pain "from all the coughing") and orthopnea. Negative for leg swelling.  Gastrointestinal: Negative for nausea, vomiting, abdominal pain and diarrhea.  Genitourinary: Negative for dysuria, hematuria, flank pain and difficulty urinating.  Musculoskeletal: Negative for back pain.  Skin: Negative for color change and rash.  Neurological: Negative for dizziness and headaches.  All other systems reviewed and are negative.    Allergies  Acetaminophen; Aspirin; Nsaids; and Vicodin  Home Medications   No current outpatient prescriptions on  file.  BP 156/66  Pulse 67  Temp 99.2 F (37.3 C) (Oral)  Resp 18  Ht 5\' 6"  (1.676 m)  Wt 185 lb 6.5 oz (84.1 kg)  BMI 29.93 kg/m2  SpO2 90%  Physical Exam  Nursing note and vitals reviewed. Constitutional: She is oriented to person, place, and time. She appears well-developed and well-nourished. No distress.  HENT:  Head: Normocephalic and atraumatic.  Eyes: Conjunctivae normal are normal. Right eye exhibits no discharge. Left eye exhibits no discharge.  Neck: No tracheal deviation present.  Cardiovascular: Normal heart sounds and intact distal pulses.   Pulmonary/Chest: No stridor. No respiratory distress. She has wheezes (diffuse). She has no rales.       diminished air movement throughout.  Abdominal: Soft. She exhibits no distension. There is no tenderness. There is no guarding.  Musculoskeletal: She exhibits no edema and no tenderness.  Neurological: She is alert and oriented to person, place, and time.  Skin: Skin is warm and dry.  Psychiatric: She has a normal mood and affect. Her behavior is normal.    ED Course  Procedures (including critical care time)  Labs Reviewed  CBC - Abnormal; Notable for the following:    Platelets 135 (*)     All other components within normal limits  BASIC METABOLIC PANEL - Abnormal; Notable for the following:    Glucose, Bld 103 (*)     All other components within normal limits  PRO B NATRIURETIC PEPTIDE - Abnormal; Notable for the following:    Pro B Natriuretic peptide (BNP) 3842.0 (*)     All other components within normal limits  POCT I-STAT TROPONIN I  MRSA PCR SCREENING  BASIC METABOLIC PANEL  CULTURE, BLOOD (ROUTINE X 2)  CULTURE, BLOOD (ROUTINE X 2)  CULTURE, EXPECTORATED SPUTUM-ASSESSMENT  GRAM STAIN  HIV ANTIBODY (ROUTINE TESTING)  LEGIONELLA ANTIGEN, URINE  STREP PNEUMONIAE URINARY ANTIGEN  HEMOGLOBIN A1C  TROPONIN I   Dg Chest 2 View (if Patient Has Fever And/or Copd)  01/10/2013  *RADIOLOGY REPORT*  Clinical  Data: Short of breath.  Chest pain.  Hypertension.  CHEST - 2 VIEW  Comparison: 09/22/2012  Findings: Heart size is at the upper limits of normal.  Mediastinal shadows are normal.  There are increased interstitial markings in a pattern suggesting early interstitial edema.  Interstitial pneumonitis is possible but felt less likely.  No consolidation or major collapse.  No effusions.  IMPRESSION: Increased interstitial density felt represent early interstitial edema.  Interstitial pneumonia not excluded but not favored.   Original Report Authenticated By: Paulina Fusi, M.D.      1. CHF exacerbation   2. Acute respiratory failure with hypoxia   3. Community acquired pneumonia   4. COPD exacerbation   5. Acute CHF      Date: 01/10/2013  Rate: 54  Rhythm: sinus bradycardia  QRS Axis: normal  Intervals: normal  ST/T Wave abnormalities: normal  Conduction Disutrbances:none  Narrative Interpretation:   Old EKG Reviewed: unchanged    MDM   57 y/o F p/w SOB and cough. Hypoxic. Improved with 4L [place].  Patient with h/o COPD and wheezing on exam. Started cont neb and given solumedrol. Only mild improvement of symptoms Elevated BNP c/w CHF. Lasix x1 Possible pna. Defer abx to inpatient team Doubt ACS Admitted to medicine.        Stevie Kern, MD 01/11/13 [phone]

## 2013-01-10 NOTE — ED Notes (Signed)
Continues to be on the hour neb.

## 2013-01-11 ENCOUNTER — Encounter (HOSPITAL_COMMUNITY): Payer: Self-pay | Admitting: *Deleted

## 2013-01-11 DIAGNOSIS — E119 Type 2 diabetes mellitus without complications: Secondary | ICD-10-CM

## 2013-01-11 DIAGNOSIS — E876 Hypokalemia: Secondary | ICD-10-CM

## 2013-01-11 DIAGNOSIS — I5033 Acute on chronic diastolic (congestive) heart failure: Principal | ICD-10-CM

## 2013-01-11 DIAGNOSIS — R000 Tachycardia, unspecified: Secondary | ICD-10-CM

## 2013-01-11 LAB — BASIC METABOLIC PANEL WITH GFR
BUN: 13 mg/dL (ref 6–23)
CO2: 33 meq/L — ABNORMAL HIGH (ref 19–32)
Calcium: 9 mg/dL (ref 8.4–10.5)
Chloride: 97 meq/L (ref 96–112)
Creatinine, Ser: 0.61 mg/dL (ref 0.50–1.10)
GFR calc Af Amer: >90 mL/min
GFR calc non Af Amer: >90 mL/min
Glucose, Bld: 109 mg/dL — ABNORMAL HIGH (ref 70–99)
Potassium: 3.2 meq/L — ABNORMAL LOW (ref 3.5–5.1)
Sodium: 142 meq/L (ref 135–145)

## 2013-01-11 LAB — TROPONIN I
Troponin I: <0.3 ng/mL
Troponin I: <0.3 ng/mL (ref <0.30)
Troponin I: <0.3 ng/mL (ref <0.30)

## 2013-01-11 LAB — MAGNESIUM: Magnesium: 1.5 mg/dL (ref 1.5–2.5)

## 2013-01-11 LAB — GLUCOSE, CAPILLARY: Glucose-Capillary: 190 mg/dL — ABNORMAL HIGH (ref 70–99)

## 2013-01-11 LAB — HEMOGLOBIN A1C
Hgb A1c MFr Bld: 5.5 %
Mean Plasma Glucose: 111 mg/dL

## 2013-01-11 LAB — LEGIONELLA ANTIGEN, URINE

## 2013-01-11 LAB — STREP PNEUMONIAE URINARY ANTIGEN: Strep Pneumo Urinary Antigen: NEGATIVE

## 2013-01-11 MED ORDER — METOPROLOL TARTRATE 1 MG/ML IV SOLN
INTRAVENOUS | Status: AC
  Administered 2013-01-11: 2.5 mg via INTRAVENOUS
  Filled 2013-01-11: qty 5

## 2013-01-11 MED ORDER — LEVALBUTEROL HCL 1.25 MG/0.5ML IN NEBU
1.2500 mg | INHALATION_SOLUTION | RESPIRATORY_TRACT | Status: DC
  Administered 2013-01-11 (×4): 1.25 mg via RESPIRATORY_TRACT
  Filled 2013-01-11 (×8): qty 0.5

## 2013-01-11 MED ORDER — BIOTENE DRY MOUTH MT LIQD
15.0000 mL | Freq: Two times a day (BID) | OROMUCOSAL | Status: DC
  Administered 2013-01-11 – 2013-01-13 (×2): 15 mL via OROMUCOSAL

## 2013-01-11 MED ORDER — NICOTINE 21 MG/24HR TD PT24
21.0000 mg | MEDICATED_PATCH | Freq: Every day | TRANSDERMAL | Status: DC
  Administered 2013-01-11 – 2013-01-13 (×3): 21 mg via TRANSDERMAL
  Filled 2013-01-11 (×3): qty 1

## 2013-01-11 MED ORDER — OXYCODONE HCL 5 MG PO TABS
20.0000 mg | ORAL_TABLET | ORAL | Status: DC | PRN
  Administered 2013-01-11 – 2013-01-13 (×11): 20 mg via ORAL
  Filled 2013-01-11 (×11): qty 4

## 2013-01-11 MED ORDER — METHYLPREDNISOLONE SODIUM SUCC 40 MG IJ SOLR
40.0000 mg | INTRAMUSCULAR | Status: DC
  Administered 2013-01-11: 40 mg via INTRAVENOUS
  Filled 2013-01-11 (×2): qty 1

## 2013-01-11 MED ORDER — POTASSIUM CHLORIDE CRYS ER 20 MEQ PO TBCR
40.0000 meq | EXTENDED_RELEASE_TABLET | Freq: Once | ORAL | Status: AC
  Administered 2013-01-11: 11:00:00 40 meq via ORAL
  Filled 2013-01-11: qty 1

## 2013-01-11 MED ORDER — METOPROLOL TARTRATE 1 MG/ML IV SOLN
2.5000 mg | Freq: Once | INTRAVENOUS | Status: AC
  Administered 2013-01-11: 2.5 mg via INTRAVENOUS

## 2013-01-11 MED ORDER — LEVALBUTEROL HCL 1.25 MG/0.5ML IN NEBU
1.2500 mg | INHALATION_SOLUTION | Freq: Four times a day (QID) | RESPIRATORY_TRACT | Status: DC
  Administered 2013-01-12 – 2013-01-13 (×6): 1.25 mg via RESPIRATORY_TRACT
  Filled 2013-01-11 (×9): qty 0.5

## 2013-01-11 MED ORDER — HYDRALAZINE HCL 20 MG/ML IJ SOLN
5.0000 mg | Freq: Once | INTRAMUSCULAR | Status: DC
  Filled 2013-01-11: qty 0.25

## 2013-01-11 MED ORDER — FUROSEMIDE 10 MG/ML IJ SOLN
40.0000 mg | Freq: Two times a day (BID) | INTRAMUSCULAR | Status: DC
  Administered 2013-01-11 – 2013-01-13 (×5): 40 mg via INTRAVENOUS
  Filled 2013-01-11 (×6): qty 4

## 2013-01-11 NOTE — Progress Notes (Signed)
Walked into room to give Bystolic and pt converted to SB 55-60.  Pt sleeping.  BP 165/84.  No meds given at this time.

## 2013-01-11 NOTE — Progress Notes (Addendum)
[facility]             Progress Note   Subjective: No complaints. Feels her SOB has drastically improved since admission.  Objective: Vital signs in last 24 hours: Temp:  [98.6 F (37 C)-99.4 F (37.4 C)] 98.8 F (37.1 C) (01/30 0800) Pulse Rate:  [52-117] 102  (01/30 0536) Resp:  [16-28] 19  (01/30 0536) BP: (118-200)/(60-111) 133/101 mmHg (01/30 0536) SpO2:  [68 %-98 %] 93 % (01/30 0725) FiO2 (%):  [36 %] 36 % (01/29 2333) Weight:  [84.1 kg (185 lb 6.5 oz)] 84.1 kg (185 lb 6.5 oz) (01/29 2250) Weight change:  Last BM Date: 01/09/13  Intake/Output from previous day: 01/29 0701 - 01/30 0700 In: 464 [P.O.:200; I.V.:60; IV Piggyback:204] Out: 2150 [Urine:2150] Total I/O In: 120 [P.O.:120] Out: -    Physical Exam: General: Alert, awake, oriented x3, in no acute distress. HEENT: No bruits, no goiter. Heart: Regular rate and rhythm, without murmurs, rubs, gallops. Lungs: Bilateral crackles, no wheezes. Abdomen: Soft, nontender, nondistended, positive bowel sounds. Extremities: No clubbing cyanosis or edema with positive pedal pulses. Neuro: Grossly intact, nonfocal.    Lab Results: Basic Metabolic Panel:  Basename 01/11/13 0610 01/10/13 1709  NA 142 140  K 3.2* 3.9  CL 97 99  CO2 33* 31  GLUCOSE 109* 103*  BUN 13 16  CREATININE 0.61 0.68  CALCIUM 9.0 9.2  MG 1.5 --  PHOS -- --   CBC:  Basename 01/10/13 1709  WBC 5.1  NEUTROABS --  HGB 14.5  HCT 43.8  MCV 96.1  PLT 135*   Cardiac Enzymes:  Basename 01/11/13 0001  CKTOTAL --  CKMB --  CKMBINDEX --  TROPONINI <0.30   BNP:  Basename 01/10/13 1856  PROBNP 3842.0*   CBG:  Basename 01/11/13 0811 01/11/13 0020  GLUCAP 130* 190*   Urine Drug Screen: Drugs of Abuse     Component Value Date/Time   LABOPIA POSITIVE* 04/14/2010 0016   COCAINSCRNUR NONE DETECTED 04/14/2010 0016   LABBENZ POSITIVE* 04/14/2010 0016   AMPHETMU NONE DETECTED 04/14/2010 0016   THCU NONE DETECTED 04/14/2010  0016   LABBARB  Value: NONE DETECTED        DRUG SCREEN FOR MEDICAL PURPOSES ONLY.  IF CONFIRMATION IS NEEDED FOR ANY PURPOSE, NOTIFY LAB WITHIN 5 DAYS.        LOWEST DETECTABLE LIMITS FOR URINE DRUG SCREEN Drug Class       Cutoff (ng/mL) Amphetamine      1000 Barbiturate      200 Benzodiazepine   200 Tricyclics       300 Opiates          300 Cocaine          300 THC              50 04/14/2010 0016     Recent Results (from the past 240 hour(s))  MRSA PCR SCREENING     Status: Normal   Collection Time   01/10/13 10:39 PM      Component Value Range Status Comment   MRSA by PCR NEGATIVE  NEGATIVE Final     Studies/Results: Dg Chest 2 View (if Patient Has Fever And/or Copd)  01/10/2013  *RADIOLOGY REPORT*  Clinical Data: Short of breath.  Chest pain.  Hypertension.  CHEST - 2 VIEW  Comparison: 09/22/2012  Findings: Heart size is at the upper limits of normal.  Mediastinal shadows are normal.  There are increased interstitial markings in a pattern  suggesting early interstitial edema.  Interstitial pneumonitis is possible but felt less likely.  No consolidation or major collapse.  No effusions.  IMPRESSION: Increased interstitial density felt represent early interstitial edema.  Interstitial pneumonia not excluded but not favored.   Original Report Authenticated By: Paulina Fusi, M.D.     Medications: Scheduled Meds:    . antiseptic oral rinse  15 mL Mouth Rinse BID  . aspirin EC  81 mg Oral Daily  . enoxaparin (LOVENOX) injection  40 mg Subcutaneous Q24H  . escitalopram  40 mg Oral Daily  . furosemide  40 mg Intravenous Q12H  . hydrALAZINE  5 mg Intravenous Once  . insulin aspart  0-9 Units Subcutaneous TID WC  . irbesartan  300 mg Oral Daily  . levalbuterol  1.25 mg Nebulization Q4H  . levofloxacin (LEVAQUIN) IV  750 mg Intravenous Q24H  . loratadine  10 mg Oral Daily  . methylPREDNISolone (SOLU-MEDROL) injection  40 mg Intravenous Q24H  . nebivolol  5 mg Oral Daily  . pantoprazole  40 mg  Oral Daily  . potassium chloride  40 mEq Oral Once  . pregabalin  50 mg Oral BID  . rOPINIRole  2 mg Oral QHS  . sodium chloride  3 mL Intravenous Q12H   Continuous Infusions:  PRN Meds:.sodium chloride, albuterol, ALPRAZolam, methocarbamol, ondansetron (ZOFRAN) IV, oxyCODONE, sodium chloride  Assessment/Plan:  Principal Problem:  *Acute respiratory failure with hypoxia Active Problems:  COPD (chronic obstructive pulmonary disease)  HTN (hypertension)  COPD exacerbation  Community acquired pneumonia  Diabetes mellitus, type 2  Diastolic CHF, acute on chronic  Hypokalemia  Sinus tachycardia   Acute Hypoxemic Respiratory Failure -Suspect MF, but mainly related to acute diast CHF. -Also COPD and PNA may be playing a role. -See below for details.  Acute on Chronic Diastolic CHF -Prior ECHO in 2011 with EF 55-60%. -Patient has crackles on exam and CXR with interstitial edema. -Will Increase lasix to 40 mg IV BID. -Aim for negative fluid balance. -She is already 1.5 L negative since admission.  COPD with Acute Exacerbation -No wheezing today. -Will start titrating steroids.  CAP -Continue levaquin for 7 days.  DM -II -Fair control. -Continue current regimen.  Tobacco Abuse -Patient not interested in quitting at this time.  Hypokalemia -Replete PO. -2/2 diuresis. -Mag ok.  Sinus Tach -Resolved. -Likely related to albuterol nebs.  Disposition -Anticipate DC in 1-2 days to home.   Time spent coordinating care: 35 minutes    LOS: 1 day   HERNANDEZ ACOSTA,ESTELA [facility] Pager: [phone] 01/11/2013, 9:28 AM

## 2013-01-11 NOTE — Progress Notes (Signed)
Event:  RN notified me that pt's HR has increased to 130-140's. EKG done reads rate as A-Fib w/ RVR. NP to bedside. Subjective: Pt currently denies c/o. States she did have a "quick twinge" in her chest earlier this evening that lasted approx 1-2 seconds but nothing since. Denies SOB, palpitations, nausea diaphoresis or other symptoms.  Objective: Ms/ [name] is a 57 y/o female admitted 01/10/2013 for acute respiratory failure w/ hypoxia after c/o 3 day h/o increasing SOB. Pt is current smoker w/ h/o COPD. PNA could not be ruled out by CXR. At bedside pt noted resting in NAD. BBS very diminished but otherwise CTA. Current VS, BP-147/100, T-99.2, P-120-130's, R-21 w/ 02 sats of 90-92% on 4L . 12 lead EKG reported A-Fib but appeared more c/w ST. Lopressor 2.5 mg given IV in attempt to slow rate for better assessment. Once rate slowed to 90-110's more convincing for ST though somewhat irregular. Pt also began to have mx pauses the longest of which was 1.27 sec in duration. Admission EKG shows Sinus [place] Foley w/o acute changes.  Assessment/Plan: 1. Sinus tachycardia: Asymptomatic.  Improved w/ small dose of IV lopressor. Noted frequent short pauses after Lopressor so will avoid beta blockers. Pt asymptomatic. Discussed pt w/ Dr Onalee Hua who has also reviewed EKG and agrees w/ plan.  Reassessment: EKG at approx 0600 shows return to SB w/ rate of 54 and w/o st elevations or depressions. Pt sleeping. Remains somewhat hypertensive at 168/84. Will continue to monitor closely.  Leanne Chang, NP-C [facility] Pager  [phone]

## 2013-01-11 NOTE — Progress Notes (Addendum)
Tele shows sudden onset afib RVR hr 130's.   Pt was sleeping.  When awakened to check bp, pt began moaning loudly, c/o back pain.  BP 155/98.  Pt denies history of irregular or fast heart rate.  Pt states "maybe it's withdrawal from my pain medicine."  Received iv rocephin, now infusing levaquin. Has had large urine output since lasix.  90-92% on 5L per [place].

## 2013-01-11 NOTE — Progress Notes (Signed)
Respiratory protocol done and patient scored in a range to change treatments from Q4 to QID with a PRN for in between. Patient has no wheezes but rhonchi in upper airways with some diminished breath sounds throughout the bases. Patient tolerating well at this time. Will reassess in 2 days

## 2013-01-11 NOTE — Progress Notes (Signed)
Advised Dr Irene Limbo of afib RVR.  He advised RN to call floor coverage.  Tama Gander PA paged.

## 2013-01-11 NOTE — Care Management Note (Unsigned)
    Page 1 of 1   01/11/2013     2:49:26 PM   CARE MANAGEMENT NOTE 01/11/2013  Patient:  NAZ, DENUNZIO   Account Number:  [id number]  Date Initiated:  01/11/2013  Documentation initiated by:  [facility]  Subjective/Objective Assessment:   57 yo female admitted with hypoxia, COPD     Action/Plan:   assess for [facility] HF RN   Anticipated DC Date:  01/13/2013   Anticipated DC Plan:  HOME/SELF CARE      DC Planning Services  CM consult      Choice offered to / List presented to:  C-1 Patient        HH arranged  HH-1 RN      [facility] agency  [facility]   Status of service:  Completed, signed off Medicare Important Message given?   (If response is "NO", the following Medicare IM given date fields will be blank) Date Medicare IM given:   Date Additional Medicare IM given:    Discharge Disposition:  HOME/SELF CARE  Per UR Regulation:    If discussed at Long Length of Stay Meetings, dates discussed:    Comments:  01/11/13 @ 1430.Marland KitchenMarland KitchenSpoke with pt regarding receiving HHRN visit after discharge from hospital.  Pt agreed to service and mentioned that she has had [facility] in the past and would like to continue with their services.  Spoke with Lupita Leash to set up [facility] visit. Oletta Cohn, RN, BSN, NCM...[phone]

## 2013-01-11 NOTE — Progress Notes (Signed)
01/11/13 @ 1430.Marland KitchenMarland KitchenSpoke with pt regarding receiving HHRN visit after discharge from hospital.  Pt agreed to service and mentioned that she has had [facility] in the past and would like to continue with their services.  Spoke with Lupita Leash to set up [facility] visit. Oletta Cohn, RN, BSN, NCM...[phone]

## 2013-01-11 NOTE — Progress Notes (Signed)
Dr Irene Limbo paged & informed of BP 182/111.  Orders received to start bystolic early this am.  Requested from pharmacy.

## 2013-01-11 NOTE — Progress Notes (Signed)
Utilization Review Completed Evangelyn Crouse J. Dody Smartt, RN, BSN, NCM [phone]  

## 2013-01-11 NOTE — Progress Notes (Signed)
Vernona Rieger has been in to examine.  2.5 mg IV metoprolol given.  BP 147/100.  Tele shows a-fib 110-120 w/ occas sinus beats.  Pt tolerating well.  Denies cp.  Xanax given po for mild anxiety.

## 2013-01-11 NOTE — Progress Notes (Signed)
Natalia Leatherwood Schorr informed of afib RVR.  No orders received at this time.  BP 152/96.

## 2013-01-11 NOTE — Progress Notes (Signed)
Dr Irene Limbo informed of pt c/o chronic severe back and leg pain.  moaning Pt states she takes 30 mg of oxycodone four times daily at home.  Orders received, medicated w/ oxycodone 5mg  and Robaxin po.  Consent signed for HIV testing.  Blood cultures drawn by lab. Using bedpan frequently, becomes sob easily. O2 sat 88-90% on ra.

## 2013-01-12 DIAGNOSIS — I10 Essential (primary) hypertension: Secondary | ICD-10-CM

## 2013-01-12 DIAGNOSIS — N179 Acute kidney failure, unspecified: Secondary | ICD-10-CM

## 2013-01-12 DIAGNOSIS — I509 Heart failure, unspecified: Secondary | ICD-10-CM

## 2013-01-12 LAB — BASIC METABOLIC PANEL
BUN: 16 mg/dL (ref 6–23)
CO2: 39 mEq/L — ABNORMAL HIGH (ref 19–32)
Calcium: 9.5 mg/dL (ref 8.4–10.5)
Glucose, Bld: 88 mg/dL (ref 70–99)
Sodium: 140 mEq/L (ref 135–145)

## 2013-01-12 LAB — GLUCOSE, CAPILLARY
Glucose-Capillary: 92 mg/dL (ref 70–99)
Glucose-Capillary: 87 mg/dL (ref 70–99)
Glucose-Capillary: 101 mg/dL — ABNORMAL HIGH (ref 70–99)

## 2013-01-12 MED ORDER — POTASSIUM CHLORIDE CRYS ER 20 MEQ PO TBCR
40.0000 meq | EXTENDED_RELEASE_TABLET | ORAL | Status: AC
  Administered 2013-01-12 (×2): 40 meq via ORAL
  Filled 2013-01-12 (×2): qty 2

## 2013-01-12 NOTE — Progress Notes (Signed)
[facility]             Progress Note   Subjective: No complaints. Feels her SOB has drastically improved since admission.  Objective: Vital signs in last 24 hours: Temp:  [98 F (36.7 C)-98.5 F (36.9 C)] 98.2 F (36.8 C) (01/31 1252) Pulse Rate:  [46-70] 67  (01/31 1252) Resp:  [17-23] 18  (01/31 1252) BP: (123-183)/(67-86) 123/82 mmHg (01/31 1252) SpO2:  [90 %-96 %] 94 % (01/31 1252) Weight:  [78.109 kg (172 lb 3.2 oz)-80 kg (176 lb 5.9 oz)] 78.109 kg (172 lb 3.2 oz) (01/31 1252) Weight change: -4.1 kg (-9 lb 0.6 oz) Last BM Date: 01/10/13  Intake/Output from previous day: 01/30 0701 - 01/31 0700 In: 650 [P.O.:500; IV Piggyback:150] Out: 2200 [Urine:2200] Total I/O In: -  Out: 500 [Urine:500]   Physical Exam: General: Alert, awake, oriented x3, in no acute distress. HEENT: No bruits, no goiter. Heart: Regular rate and rhythm, without murmurs, rubs, gallops. Lungs: Bilateral crackles, no wheezes. Abdomen: Soft, nontender, nondistended, positive bowel sounds. Extremities: No clubbing cyanosis or edema with positive pedal pulses. Neuro: Grossly intact, nonfocal.    Lab Results: Basic Metabolic Panel:  Basename 01/12/13 0505 01/11/13 0610  NA 140 142  K 3.0* 3.2*  CL 93* 97  CO2 39* 33*  GLUCOSE 88 109*  BUN 16 13  CREATININE 0.85 0.61  CALCIUM 9.5 9.0  MG -- 1.5  PHOS -- --   CBC:  Basename 01/10/13 1709  WBC 5.1  NEUTROABS --  HGB 14.5  HCT 43.8  MCV 96.1  PLT 135*   Cardiac Enzymes:  Basename 01/11/13 2006 01/11/13 1529 01/11/13 0930  CKTOTAL -- -- --  CKMB -- -- --  CKMBINDEX -- -- --  TROPONINI <0.30 <0.30 <0.30   BNP:  Basename 01/10/13 1856  PROBNP 3842.0*   CBG:  Basename 01/12/13 1214 01/12/13 0811 01/11/13 2140 01/11/13 1536 01/11/13 0811 01/11/13 0020  GLUCAP 92 96 106* 128* 130* 190*   Urine Drug Screen: Drugs of Abuse     Component Value Date/Time   LABOPIA POSITIVE* 04/14/2010 0016   COCAINSCRNUR NONE  DETECTED 04/14/2010 0016   LABBENZ POSITIVE* 04/14/2010 0016   AMPHETMU NONE DETECTED 04/14/2010 0016   THCU NONE DETECTED 04/14/2010 0016   LABBARB  Value: NONE DETECTED        DRUG SCREEN FOR MEDICAL PURPOSES ONLY.  IF CONFIRMATION IS NEEDED FOR ANY PURPOSE, NOTIFY LAB WITHIN 5 DAYS.        LOWEST DETECTABLE LIMITS FOR URINE DRUG SCREEN Drug Class       Cutoff (ng/mL) Amphetamine      1000 Barbiturate      200 Benzodiazepine   200 Tricyclics       300 Opiates          300 Cocaine          300 THC              50 04/14/2010 0016     Recent Results (from the past 240 hour(s))  MRSA PCR SCREENING     Status: Normal   Collection Time   01/10/13 10:39 PM      Component Value Range Status Comment   MRSA by PCR NEGATIVE  NEGATIVE Final   CULTURE, BLOOD (ROUTINE X 2)     Status: Normal (Preliminary result)   Collection Time   01/11/13 12:01 AM      Component Value Range Status Comment   Specimen Description BLOOD RIGHT  ARM   Final    Special Requests     Final    Value: BOTTLES DRAWN AEROBIC AND ANAEROBIC 10CC AER 6CC ANA   Culture  Setup Time 01/11/2013 03:49   Final    Culture     Final    Value:        BLOOD CULTURE RECEIVED NO GROWTH TO DATE CULTURE WILL BE HELD FOR 5 DAYS BEFORE ISSUING A FINAL NEGATIVE REPORT   Report Status PENDING   Incomplete   CULTURE, BLOOD (ROUTINE X 2)     Status: Normal (Preliminary result)   Collection Time   01/11/13 12:05 AM      Component Value Range Status Comment   Specimen Description BLOOD RIGHT ARM   Final    Special Requests     Final    Value: BOTTLES DRAWN AEROBIC AND ANAEROBIC 10CC AER,6CC ANA   Culture  Setup Time 01/11/2013 03:49   Final    Culture     Final    Value:        BLOOD CULTURE RECEIVED NO GROWTH TO DATE CULTURE WILL BE HELD FOR 5 DAYS BEFORE ISSUING A FINAL NEGATIVE REPORT   Report Status PENDING   Incomplete     Studies/Results: Dg Chest 2 View (if Patient Has Fever And/or Copd)  01/10/2013  *RADIOLOGY REPORT*  Clinical Data: Short of  breath.  Chest pain.  Hypertension.  CHEST - 2 VIEW  Comparison: 09/22/2012  Findings: Heart size is at the upper limits of normal.  Mediastinal shadows are normal.  There are increased interstitial markings in a pattern suggesting early interstitial edema.  Interstitial pneumonitis is possible but felt less likely.  No consolidation or major collapse.  No effusions.  IMPRESSION: Increased interstitial density felt represent early interstitial edema.  Interstitial pneumonia not excluded but not favored.   Original Report Authenticated By: Paulina Fusi, M.D.     Medications: Scheduled Meds:    . antiseptic oral rinse  15 mL Mouth Rinse BID  . aspirin EC  81 mg Oral Daily  . enoxaparin (LOVENOX) injection  40 mg Subcutaneous Q24H  . escitalopram  40 mg Oral Daily  . furosemide  40 mg Intravenous Q12H  . hydrALAZINE  5 mg Intravenous Once  . insulin aspart  0-9 Units Subcutaneous TID WC  . irbesartan  300 mg Oral Daily  . levalbuterol  1.25 mg Nebulization QID  . levofloxacin (LEVAQUIN) IV  750 mg Intravenous Q24H  . loratadine  10 mg Oral Daily  . nebivolol  5 mg Oral Daily  . nicotine  21 mg Transdermal Daily  . pantoprazole  40 mg Oral Daily  . potassium chloride  40 mEq Oral Q4H  . pregabalin  50 mg Oral BID  . rOPINIRole  2 mg Oral QHS  . sodium chloride  3 mL Intravenous Q12H   Continuous Infusions:  PRN Meds:.sodium chloride, albuterol, ALPRAZolam, methocarbamol, ondansetron (ZOFRAN) IV, oxyCODONE, sodium chloride  Assessment/Plan:  Principal Problem:  *Acute respiratory failure with hypoxia Active Problems:  COPD (chronic obstructive pulmonary disease)  HTN (hypertension)  COPD exacerbation  Community acquired pneumonia  Diabetes mellitus, type 2  Diastolic CHF, acute on chronic  Hypokalemia  Sinus tachycardia   Acute Hypoxemic Respiratory Failure -Suspect MF, but mainly related to acute diast CHF. -Also COPD and PNA may be playing a role. -See below for  details.  Acute on Chronic Diastolic CHF -Prior ECHO in 2011 with EF 55-60%. -Patient has crackles on exam and  CXR with interstitial edema. -Continue lasix to 40 mg IV BID. -Aim for negative fluid balance. -She is 3.7 L negative since admission.  COPD with Acute Exacerbation -No wheezing today. -Will further titrate steroids to be discontinued today.  CAP -Continue levaquin for 7 days.  DM -II -Fair control. -Continue current regimen.  Tobacco Abuse -Patient not interested in quitting at this time.  Hypokalemia -Replete PO. -2/2 diuresis. -Mag ok.  Sinus Tach -Resolved. -Likely related to albuterol nebs.  Disposition -Anticipate DC in 1-2 days to home.   Time spent coordinating care: 35 minutes    LOS: 2 days   [facility] Pager: [phone] 01/12/2013, 1:19 PM

## 2013-01-12 NOTE — Progress Notes (Signed)
Patient transferring to 4711 via wheelchair. Report given to Tiffany [id number] RN via phone.

## 2013-01-12 NOTE — Progress Notes (Signed)
Echocardiogram 2D Echocardiogram has been performed.  [name] 01/12/2013, 11:25 AM

## 2013-01-12 NOTE — ED Provider Notes (Signed)
I saw and evaluated the patient, reviewed the resident's note and I agree with the findings and plan.  Patient with hypoxia requiring supplemental oxygen. Cause is a mixed picture of CHF, possible component of COPD but not improved much with nebulizers, CHF and BNP elevated so RX with IV Lasix. Also possible early pneumonia process will defer antibiotics for now. Concur with residents EKG interpretation.   CRITICAL CARE Performed by: Shelda Jakes.   Total critical care time: 30  Critical care time was exclusive of separately billable procedures and treating other patients.  Critical care was necessary to treat or prevent imminent or life-threatening deterioration.  Critical care was time spent personally by me on the following activities: development of treatment plan with patient and/or surrogate as well as nursing, discussions with consultants, evaluation of patient's response to treatment, examination of patient, obtaining history from patient or surrogate, ordering and performing treatments and interventions, ordering and review of laboratory studies, ordering and review of radiographic studies, pulse oximetry and re-evaluation of patient's condition.   Results for orders placed during the hospital encounter of 01/10/13  CBC      Component Value Range   WBC 5.1  4.0 - 10.5 K/uL   RBC 4.56  3.87 - 5.11 MIL/uL   Hemoglobin 14.5  12.0 - 15.0 g/dL   HCT 62.1  30.8 - 65.7 %   MCV 96.1  78.0 - 100.0 fL   MCH 31.8  26.0 - 34.0 pg   MCHC 33.1  30.0 - 36.0 g/dL   RDW 84.6  96.2 - 95.2 %   Platelets 135 (*) 150 - 400 K/uL  BASIC METABOLIC PANEL      Component Value Range   Sodium 140  135 - 145 mEq/L   Potassium 3.9  3.5 - 5.1 mEq/L   Chloride 99  96 - 112 mEq/L   CO2 31  19 - 32 mEq/L   Glucose, Bld 103 (*) 70 - 99 mg/dL   BUN 16  6 - 23 mg/dL   Creatinine, Ser 8.41  0.50 - 1.10 mg/dL   Calcium 9.2  8.4 - 32.4 mg/dL   GFR calc non Af Amer >90  >90 mL/min   GFR calc Af Amer >90   >90 mL/min  POCT I-STAT TROPONIN I      Component Value Range   Troponin i, poc 0.00  0.00 - 0.08 ng/mL   Comment 3           PRO B NATRIURETIC PEPTIDE      Component Value Range   Pro B Natriuretic peptide (BNP) 3842.0 (*) 0 - 125 pg/mL  MRSA PCR SCREENING      Component Value Range   MRSA by PCR NEGATIVE  NEGATIVE  BASIC METABOLIC PANEL      Component Value Range   Sodium 142  135 - 145 mEq/L   Potassium 3.2 (*) 3.5 - 5.1 mEq/L   Chloride 97  96 - 112 mEq/L   CO2 33 (*) 19 - 32 mEq/L   Glucose, Bld 109 (*) 70 - 99 mg/dL   BUN 13  6 - 23 mg/dL   Creatinine, Ser 4.01  0.50 - 1.10 mg/dL   Calcium 9.0  8.4 - 02.7 mg/dL   GFR calc non Af Amer >90  >90 mL/min   GFR calc Af Amer >90  >90 mL/min  CULTURE, BLOOD (ROUTINE X 2)      Component Value Range   Specimen Description BLOOD RIGHT ARM  Special Requests       Value: BOTTLES DRAWN AEROBIC AND ANAEROBIC 10CC AER 6CC ANA   Culture  Setup Time 01/11/2013 03:49     Culture       Value:        BLOOD CULTURE RECEIVED NO GROWTH TO DATE CULTURE WILL BE HELD FOR 5 DAYS BEFORE ISSUING A FINAL NEGATIVE REPORT   Report Status PENDING    CULTURE, BLOOD (ROUTINE X 2)      Component Value Range   Specimen Description BLOOD RIGHT ARM     Special Requests       Value: BOTTLES DRAWN AEROBIC AND ANAEROBIC 10CC AER,6CC ANA   Culture  Setup Time 01/11/2013 03:49     Culture       Value:        BLOOD CULTURE RECEIVED NO GROWTH TO DATE CULTURE WILL BE HELD FOR 5 DAYS BEFORE ISSUING A FINAL NEGATIVE REPORT   Report Status PENDING    HIV ANTIBODY (ROUTINE TESTING)      Component Value Range   HIV NON REACTIVE  NON REACTIVE  LEGIONELLA ANTIGEN, URINE      Component Value Range   Specimen Description URINE, RANDOM     Special Requests NONE     Legionella Antigen, Urine Negative for Legionella pneumophilia serogroup 1     Report Status 01/11/2013 FINAL    STREP PNEUMONIAE URINARY ANTIGEN      Component Value Range   Strep Pneumo Urinary  Antigen NEGATIVE  NEGATIVE  HEMOGLOBIN A1C      Component Value Range   Hemoglobin A1C 5.5  <5.7 %   Mean Plasma Glucose 111  <117 mg/dL  TROPONIN I      Component Value Range   Troponin I <0.30  <0.30 ng/mL  GLUCOSE, CAPILLARY      Component Value Range   Glucose-Capillary 190 (*) 70 - 99 mg/dL   Comment 1 Notify RN     Comment 2 Documented in Chart    MAGNESIUM      Component Value Range   Magnesium 1.5  1.5 - 2.5 mg/dL  TROPONIN I      Component Value Range   Troponin I <0.30  <0.30 ng/mL  TROPONIN I      Component Value Range   Troponin I <0.30  <0.30 ng/mL  TROPONIN I      Component Value Range   Troponin I <0.30  <0.30 ng/mL  GLUCOSE, CAPILLARY      Component Value Range   Glucose-Capillary 130 (*) 70 - 99 mg/dL  GLUCOSE, CAPILLARY      Component Value Range   Glucose-Capillary 128 (*) 70 - 99 mg/dL  BASIC METABOLIC PANEL      Component Value Range   Sodium 140  135 - 145 mEq/L   Potassium 3.0 (*) 3.5 - 5.1 mEq/L   Chloride 93 (*) 96 - 112 mEq/L   CO2 39 (*) 19 - 32 mEq/L   Glucose, Bld 88  70 - 99 mg/dL   BUN 16  6 - 23 mg/dL   Creatinine, Ser 2.95  0.50 - 1.10 mg/dL   Calcium 9.5  8.4 - 62.1 mg/dL   GFR calc non Af Amer 76 (*) >90 mL/min   GFR calc Af Amer 88 (*) >90 mL/min  GLUCOSE, CAPILLARY      Component Value Range   Glucose-Capillary 106 (*) 70 - 99 mg/dL   Comment 1 Documented in Chart     Comment 2  Notify RN    GLUCOSE, CAPILLARY      Component Value Range   Glucose-Capillary 96  70 - 99 mg/dL  GLUCOSE, CAPILLARY      Component Value Range   Glucose-Capillary 92  70 - 99 mg/dL  GLUCOSE, CAPILLARY      Component Value Range   Glucose-Capillary 87  70 - 99 mg/dL   Comment 1 Notify RN    GLUCOSE, CAPILLARY      Component Value Range   Glucose-Capillary 101 (*) 70 - 99 mg/dL   Results for orders placed during the hospital encounter of 01/10/13  CBC      Component Value Range   WBC 5.1  4.0 - 10.5 K/uL   RBC 4.56  3.87 - 5.11 MIL/uL    Hemoglobin 14.5  12.0 - 15.0 g/dL   HCT 72.5  36.6 - 44.0 %   MCV 96.1  78.0 - 100.0 fL   MCH 31.8  26.0 - 34.0 pg   MCHC 33.1  30.0 - 36.0 g/dL   RDW 34.7  42.5 - 95.6 %   Platelets 135 (*) 150 - 400 K/uL  BASIC METABOLIC PANEL      Component Value Range   Sodium 140  135 - 145 mEq/L   Potassium 3.9  3.5 - 5.1 mEq/L   Chloride 99  96 - 112 mEq/L   CO2 31  19 - 32 mEq/L   Glucose, Bld 103 (*) 70 - 99 mg/dL   BUN 16  6 - 23 mg/dL   Creatinine, Ser 3.87  0.50 - 1.10 mg/dL   Calcium 9.2  8.4 - 56.4 mg/dL   GFR calc non Af Amer >90  >90 mL/min   GFR calc Af Amer >90  >90 mL/min  POCT I-STAT TROPONIN I      Component Value Range   Troponin i, poc 0.00  0.00 - 0.08 ng/mL   Comment 3           PRO B NATRIURETIC PEPTIDE      Component Value Range   Pro B Natriuretic peptide (BNP) 3842.0 (*) 0 - 125 pg/mL  MRSA PCR SCREENING      Component Value Range   MRSA by PCR NEGATIVE  NEGATIVE  BASIC METABOLIC PANEL      Component Value Range   Sodium 142  135 - 145 mEq/L   Potassium 3.2 (*) 3.5 - 5.1 mEq/L   Chloride 97  96 - 112 mEq/L   CO2 33 (*) 19 - 32 mEq/L   Glucose, Bld 109 (*) 70 - 99 mg/dL   BUN 13  6 - 23 mg/dL   Creatinine, Ser 3.32  0.50 - 1.10 mg/dL   Calcium 9.0  8.4 - 95.1 mg/dL   GFR calc non Af Amer >90  >90 mL/min   GFR calc Af Amer >90  >90 mL/min  CULTURE, BLOOD (ROUTINE X 2)      Component Value Range   Specimen Description BLOOD RIGHT ARM     Special Requests       Value: BOTTLES DRAWN AEROBIC AND ANAEROBIC 10CC AER 6CC ANA   Culture  Setup Time 01/11/2013 03:49     Culture       Value:        BLOOD CULTURE RECEIVED NO GROWTH TO DATE CULTURE WILL BE HELD FOR 5 DAYS BEFORE ISSUING A FINAL NEGATIVE REPORT   Report Status PENDING    CULTURE, BLOOD (ROUTINE X 2)  Component Value Range   Specimen Description BLOOD RIGHT ARM     Special Requests       Value: BOTTLES DRAWN AEROBIC AND ANAEROBIC 10CC AER,6CC ANA   Culture  Setup Time 01/11/2013 03:49      Culture       Value:        BLOOD CULTURE RECEIVED NO GROWTH TO DATE CULTURE WILL BE HELD FOR 5 DAYS BEFORE ISSUING A FINAL NEGATIVE REPORT   Report Status PENDING    HIV ANTIBODY (ROUTINE TESTING)      Component Value Range   HIV NON REACTIVE  NON REACTIVE  LEGIONELLA ANTIGEN, URINE      Component Value Range   Specimen Description URINE, RANDOM     Special Requests NONE     Legionella Antigen, Urine Negative for Legionella pneumophilia serogroup 1     Report Status 01/11/2013 FINAL    STREP PNEUMONIAE URINARY ANTIGEN      Component Value Range   Strep Pneumo Urinary Antigen NEGATIVE  NEGATIVE  HEMOGLOBIN A1C      Component Value Range   Hemoglobin A1C 5.5  <5.7 %   Mean Plasma Glucose 111  <117 mg/dL  TROPONIN I      Component Value Range   Troponin I <0.30  <0.30 ng/mL  GLUCOSE, CAPILLARY      Component Value Range   Glucose-Capillary 190 (*) 70 - 99 mg/dL   Comment 1 Notify RN     Comment 2 Documented in Chart    MAGNESIUM      Component Value Range   Magnesium 1.5  1.5 - 2.5 mg/dL  TROPONIN I      Component Value Range   Troponin I <0.30  <0.30 ng/mL  TROPONIN I      Component Value Range   Troponin I <0.30  <0.30 ng/mL  TROPONIN I      Component Value Range   Troponin I <0.30  <0.30 ng/mL  GLUCOSE, CAPILLARY      Component Value Range   Glucose-Capillary 130 (*) 70 - 99 mg/dL  GLUCOSE, CAPILLARY      Component Value Range   Glucose-Capillary 128 (*) 70 - 99 mg/dL  BASIC METABOLIC PANEL      Component Value Range   Sodium 140  135 - 145 mEq/L   Potassium 3.0 (*) 3.5 - 5.1 mEq/L   Chloride 93 (*) 96 - 112 mEq/L   CO2 39 (*) 19 - 32 mEq/L   Glucose, Bld 88  70 - 99 mg/dL   BUN 16  6 - 23 mg/dL   Creatinine, Ser 1.61  0.50 - 1.10 mg/dL   Calcium 9.5  8.4 - 09.6 mg/dL   GFR calc non Af Amer 76 (*) >90 mL/min   GFR calc Af Amer 88 (*) >90 mL/min  GLUCOSE, CAPILLARY      Component Value Range   Glucose-Capillary 106 (*) 70 - 99 mg/dL   Comment 1 Documented  in Chart     Comment 2 Notify RN    GLUCOSE, CAPILLARY      Component Value Range   Glucose-Capillary 96  70 - 99 mg/dL  GLUCOSE, CAPILLARY      Component Value Range   Glucose-Capillary 92  70 - 99 mg/dL  GLUCOSE, CAPILLARY      Component Value Range   Glucose-Capillary 87  70 - 99 mg/dL   Comment 1 Notify RN    GLUCOSE, CAPILLARY      Component Value  Range   Glucose-Capillary 101 (*) 70 - 99 mg/dL   Dg Chest 2 View (if Patient Has Fever And/or Copd)  01/10/2013  *RADIOLOGY REPORT*  Clinical Data: Short of breath.  Chest pain.  Hypertension.  CHEST - 2 VIEW  Comparison: 09/22/2012  Findings: Heart size is at the upper limits of normal.  Mediastinal shadows are normal.  There are increased interstitial markings in a pattern suggesting early interstitial edema.  Interstitial pneumonitis is possible but felt less likely.  No consolidation or major collapse.  No effusions.  IMPRESSION: Increased interstitial density felt represent early interstitial edema.  Interstitial pneumonia not excluded but not favored.   Original Report Authenticated By: Paulina Fusi, M.D.        Shelda Jakes, MD 01/12/13 [phone]

## 2013-01-12 NOTE — Progress Notes (Signed)
Pt admitted to room 4711, pt alert and oriented and placed on tele.  Will carry out MD orders and continue to monitor.

## 2013-01-12 NOTE — Progress Notes (Signed)
Pt's bp mainly staying in the 180's/70's with a few bp readings in the 150's systolic.  HR is 44-50-SB.  Natalia [facility] notified of above and she states that she is reluctant to treat the pt's BP at this time and it will be followed up on later this am. Will continue to monitor.

## 2013-01-13 LAB — BASIC METABOLIC PANEL
BUN: 24 mg/dL — ABNORMAL HIGH (ref 6–23)
Creatinine, Ser: 0.98 mg/dL (ref 0.50–1.10)
GFR calc Af Amer: 74 mL/min — ABNORMAL LOW (ref >90)
GFR calc non Af Amer: 64 mL/min — ABNORMAL LOW (ref >90)
Potassium: 3.6 mEq/L (ref 3.5–5.1)

## 2013-01-13 LAB — GLUCOSE, CAPILLARY

## 2013-01-13 MED ORDER — FUROSEMIDE 40 MG PO TABS: 40.0000 mg | ORAL_TABLET | Freq: Every day | ORAL | Status: DC

## 2013-01-13 MED ORDER — POTASSIUM CHLORIDE ER 10 MEQ PO TBCR: 20.0000 meq | EXTENDED_RELEASE_TABLET | Freq: Two times a day (BID) | ORAL | Status: DC

## 2013-01-13 MED ORDER — LEVOFLOXACIN 750 MG PO TABS: 750.0000 mg | ORAL_TABLET | Freq: Every day | ORAL | Status: DC

## 2013-01-13 MED ORDER — OXYCODONE HCL 20 MG PO TABS: 20.0000 mg | ORAL_TABLET | ORAL | Status: DC | PRN

## 2013-01-13 NOTE — Plan of Care (Signed)
Problem: Phase III Progression Outcomes Goal: Dyspnea controlled with activity Outcome: Adequate for Discharge Going home with 02   Problem: Discharge Progression Outcomes Goal: Pain controlled with appropriate interventions Outcome: Adequate for Discharge Pt has Chronic back pain takes pain pills every 4 hrs

## 2013-01-13 NOTE — Plan of Care (Signed)
Problem: Phase II Progression Outcomes Goal: Dyspnea controlled with activity Outcome: Completed/Met Date Met:  01/13/13 With 02 at 2 L n/c

## 2013-01-13 NOTE — Progress Notes (Signed)
Pt discharged per w/c with all belongings including oxygen tank and tubing on at 2 L n/c Accompanied by nurse tech and pt's sister via private vehicle.

## 2013-01-13 NOTE — Plan of Care (Signed)
Problem: Phase I Progression Outcomes Goal: EF % per last Echo/documented,Core Reminder form on chart Echo on 01/12/2013 showed an EF of 55-60%

## 2013-01-13 NOTE — Plan of Care (Signed)
Problem: Consults Goal: Tobacco Cessation referral if indicated Outcome: Completed/Met Date Met:  01/13/13 Given care notes on stopping smoking

## 2013-01-13 NOTE — Discharge Summary (Signed)
Physician Discharge Summary  Patient ID: [name] MRN: [id number] DOB/AGE: December 08, 1956 57 y.o.  Admit date: 01/10/2013 Discharge date: 01/13/2013  Primary Care Physician:  Ailene Ravel, MD   Discharge Diagnoses:    Principal Problem:  *Acute respiratory failure with hypoxia Active Problems:  COPD (chronic obstructive pulmonary disease)  HTN (hypertension)  COPD exacerbation  Community acquired pneumonia  Diabetes mellitus, type 2  Diastolic CHF, acute on chronic  Hypokalemia  Sinus tachycardia      Medication List     As of 01/13/2013 10:48 AM    TAKE these medications         albuterol 108 (90 BASE) MCG/ACT inhaler   Commonly known as: PROVENTIL HFA;VENTOLIN HFA   Inhale 2 puffs into the lungs every 6 (six) hours as needed. For shortness of breath/wheezing.      ALPRAZolam 0.5 MG tablet   Commonly known as: XANAX   Take 0.5 mg by mouth 3 (three) times daily as needed. Anxiety        aspirin EC 81 MG tablet   Take 81 mg by mouth daily.      escitalopram 20 MG tablet   Commonly known as: LEXAPRO   Take 40 mg by mouth daily.      esomeprazole 40 MG capsule   Commonly known as: NEXIUM   Take 40 mg by mouth daily before breakfast.      fenofibrate micronized 134 MG capsule   Commonly known as: LOFIBRA   Take 134 mg by mouth daily before breakfast.      fexofenadine 180 MG tablet   Commonly known as: ALLEGRA   Take 180 mg by mouth daily.      furosemide 40 MG tablet   Commonly known as: LASIX   Take 1 tablet (40 mg total) by mouth daily.      levofloxacin 750 MG tablet   Commonly known as: LEVAQUIN   Take 1 tablet (750 mg total) by mouth daily.      methocarbamol 500 MG tablet   Commonly known as: ROBAXIN   Take 1-2 tabs po Q6hrprn spasms      multivitamin with minerals Tabs   Take 1 tablet by mouth daily.      nebivolol 5 MG tablet   Commonly known as: BYSTOLIC   Take 5 mg by mouth daily.      olmesartan 40 MG tablet   Commonly known as:  BENICAR   Take 40 mg by mouth daily.      OS-CAL 500 + D 500-600 MG-UNIT Tabs   Generic drug: Calcium Carb-Cholecalciferol   Take 1 tablet by mouth daily.      Oxycodone HCl 20 MG Tabs   Take 1 tablet (20 mg total) by mouth every 4 (four) hours as needed.      potassium chloride 10 MEQ tablet   Commonly known as: K-DUR   Take 2 tablets (20 mEq total) by mouth 2 (two) times daily.      pregabalin 50 MG capsule   Commonly known as: LYRICA   Take 50 mg by mouth 2 (two) times daily.      rOPINIRole 1 MG tablet   Commonly known as: REQUIP   Take 2 mg by mouth at bedtime.         Disposition and Follow-up:  Will be discharged home today in stable condition. Has been advised to follow up with her PCP in 2 weeks.  Consults:  None   Significant Diagnostic Studies:  No results found.  Brief H and P: For complete details please refer to admission H and P, but in brief patient is a40 year old woman with history COPD (not oxygen dependent) and current smoking presented with hypoxia and shortness of breath. Initial evaluation suggested COPD exacerbation, possible pneumonia, possible pulmonary edema.  Symptoms began 3 days prior to presentation with shortness of breath and productive cough, congestion. No headache. Shortness of breath worse with exertion or lying flat. No significant lower remedy edema. Was seen by primary care physician today found to be hypoxic with saturation in the 60s and sent to the emergency department. Did receive a flu shot this year.  Upon arrival to the emergency department, O2 sats 68% on RA. Improved to 96% on 4L [place]. Afebrile, tachypneic, systolic blood pressure 180-200. CBC and basic metabolic panel unremarkable, point-of-care troponin negative, BNP 3842. Chest x-ray--early interstitial edema, cannot exclude interstitial pneumonia. Treated with albuterol, Lasix and Solu-Medrol in the emergency department. We were asked to admit her for further evaluation and  management.     Hospital Course:  Principal Problem:  *Acute respiratory failure with hypoxia Active Problems:  COPD (chronic obstructive pulmonary disease)  HTN (hypertension)  COPD exacerbation  Community acquired pneumonia  Diabetes mellitus, type 2  Diastolic CHF, acute on chronic  Hypokalemia  Sinus tachycardia    Acute Hypoxemic Respiratory Failure  -Suspect MF, but mainly related to acute diast CHF.  -Also COPD and PNA playing a role.  -See below for details.   Acute on Chronic Diastolic CHF  -Prior ECHO in 2011 with EF 55-60% and grade 1 diastolic dysfunction.  -She is 4.3 L negative since admission.  -No longer has crackles. -Still is requiring oxygen. -Lasix 40 mg daily at home.  COPD with Acute Exacerbation  -No wheezing today.  -Has completed a steroid taper.  CAP  -Continue levaquin for 5 days after DC.   DM -II  -Fair control.  -Continue current regimen.   Tobacco Abuse  -Patient not interested in quitting at this time.   Hypokalemia  -Repleted -Mag ok.   Sinus Tach  -Resolved.  -Likely related to albuterol nebs.    Time spent on Discharge: Greater than 30 minutes.  SignedChaya Jan [facility] Pager: [phone] 01/13/2013, 10:48 AM

## 2013-01-13 NOTE — Progress Notes (Signed)
Went over all discharge info including follow up appts, home meds, CHF teaching, and when to call MD. [facility] brought home 02. Reminded pt not to smoke.

## 2013-01-13 NOTE — Progress Notes (Addendum)
Pt receiving narcotics up to every 4 hrs for chronic back pain got last dose at 0920 - asking for more pain meds when left alone pt falls back to sleep

## 2013-01-13 NOTE — Progress Notes (Signed)
SATURATION QUALIFICATIONS: (This note is used to comply with regulatory documentation for home oxygen)  Patient Saturations on Room Air at Rest =88%  Patient Saturations on Room Air while Ambulating = 86%  Patient Saturations on 2 Liters of oxygen while Ambulating = 91%  Please briefly explain why patient needs home oxygen:COPD/ CHF

## 2013-01-14 NOTE — Progress Notes (Signed)
   CARE MANAGEMENT NOTE 01/14/2013  Patient:  [name], [name]   Account Number:  [id number]  Date Initiated:  01/11/2013  Documentation initiated by:  [facility]  Subjective/Objective Assessment:   57 yo female admitted with hypoxia, COPD     Action/Plan:   assess for [facility] HF RN   Anticipated DC Date:  01/13/2013   Anticipated DC Plan:  HOME/SELF CARE      DC Planning Services  CM consult      Choice offered to / List presented to:  C-1 Patient   DME arranged  [facility]      DME agency  [facility]     [facility] arranged  HH-1 RN      [place] agency  [facility]   Status of service:  Completed, signed off Medicare Important Message given?   (If response is "NO", the following Medicare IM given date fields will be blank) Date Medicare IM given:   Date Additional Medicare IM given:    Discharge Disposition:  HOME W HOME HEALTH SERVICES  Per UR Regulation:    If discussed at Long Length of Stay Meetings, dates discussed:    Comments:  Elliot Cousin, RN Case Manager Signed CASE MANAGEMENT Progress Notes 01/14/2013 9:23 AM Late entry 01/13/2013- received call from [facility] that order for [facility] needed. NCM contacted attending MD and orders received. Contacted [facility] and pt was set up for [facility]. Isidoro Donning RN CCM Case Mgmt phone [phone]   01/11/13 @ 1430.Marland KitchenMarland KitchenSpoke with pt regarding receiving HHRN visit after discharge from hospital.  Pt agreed to service and mentioned that she has had [facility] in the past and would like to continue with their services.  Spoke with Lupita Leash to set up [facility] visit. Oletta Cohn, RN, BSN, NCM...[phone]

## 2013-01-14 NOTE — Progress Notes (Signed)
Late entry 01/13/2013- received call from [facility] that order for [facility] needed. NCM contacted attending MD and orders received. Contacted [facility] and pt was set up for [facility]. Isidoro Donning RN CCM Case Mgmt phone [phone]

## 2013-01-17 LAB — CULTURE, BLOOD (ROUTINE X 2)
Culture: NO GROWTH
Culture: NO GROWTH

## 2013-01-29 ENCOUNTER — Ambulatory Visit (HOSPITAL_COMMUNITY)
Admission: RE | Admit: 2013-01-29 | Discharge: 2013-01-29 | Disposition: A | Discharge status: Home / Self Care | Payer: PRIVATE HEALTH INSURANCE | Source: Ambulatory Visit | Attending: Obstetrics and Gynecology | Admitting: Obstetrics and Gynecology

## 2013-01-29 DIAGNOSIS — Z1231 Encounter for screening mammogram for malignant neoplasm of breast: Secondary | ICD-10-CM

## 2013-04-25 ENCOUNTER — Ambulatory Visit
Admission: RE | Admit: 2013-04-25 | Discharge: 2013-04-25 | Disposition: A | Discharge status: Home / Self Care | Payer: PRIVATE HEALTH INSURANCE | Source: Ambulatory Visit | Attending: Family Medicine | Admitting: Family Medicine

## 2013-04-25 ENCOUNTER — Other Ambulatory Visit: Payer: Self-pay | Admitting: Family Medicine

## 2013-04-25 DIAGNOSIS — W19XXXA Unspecified fall, initial encounter: Secondary | ICD-10-CM

## 2013-04-25 DIAGNOSIS — M549 Dorsalgia, unspecified: Secondary | ICD-10-CM

## 2013-04-25 DIAGNOSIS — R159 Full incontinence of feces: Secondary | ICD-10-CM

## 2013-04-27 ENCOUNTER — Encounter (HOSPITAL_COMMUNITY): Payer: Self-pay | Admitting: Emergency Medicine

## 2013-04-27 ENCOUNTER — Inpatient Hospital Stay (HOSPITAL_COMMUNITY)
Admission: EM | Admit: 2013-04-27 | Discharge: 2013-04-30 | DRG: 872 | Disposition: A | Discharge status: Home / Self Care | Payer: PRIVATE HEALTH INSURANCE | Attending: Internal Medicine | Admitting: Internal Medicine

## 2013-04-27 ENCOUNTER — Emergency Department (HOSPITAL_COMMUNITY): Payer: PRIVATE HEALTH INSURANCE

## 2013-04-27 DIAGNOSIS — Z7982 Long term (current) use of aspirin: Secondary | ICD-10-CM

## 2013-04-27 DIAGNOSIS — K219 Gastro-esophageal reflux disease without esophagitis: Secondary | ICD-10-CM | POA: Diagnosis present

## 2013-04-27 DIAGNOSIS — N179 Acute kidney failure, unspecified: Secondary | ICD-10-CM | POA: Diagnosis present

## 2013-04-27 DIAGNOSIS — G8929 Other chronic pain: Secondary | ICD-10-CM | POA: Diagnosis present

## 2013-04-27 DIAGNOSIS — E785 Hyperlipidemia, unspecified: Secondary | ICD-10-CM | POA: Diagnosis present

## 2013-04-27 DIAGNOSIS — Z96659 Presence of unspecified artificial knee joint: Secondary | ICD-10-CM

## 2013-04-27 DIAGNOSIS — I959 Hypotension, unspecified: Secondary | ICD-10-CM | POA: Diagnosis present

## 2013-04-27 DIAGNOSIS — Z981 Arthrodesis status: Secondary | ICD-10-CM

## 2013-04-27 DIAGNOSIS — E876 Hypokalemia: Secondary | ICD-10-CM

## 2013-04-27 DIAGNOSIS — F3289 Other specified depressive episodes: Secondary | ICD-10-CM | POA: Diagnosis present

## 2013-04-27 DIAGNOSIS — N390 Urinary tract infection, site not specified: Secondary | ICD-10-CM | POA: Diagnosis present

## 2013-04-27 DIAGNOSIS — J449 Chronic obstructive pulmonary disease, unspecified: Secondary | ICD-10-CM

## 2013-04-27 DIAGNOSIS — E119 Type 2 diabetes mellitus without complications: Secondary | ICD-10-CM | POA: Diagnosis present

## 2013-04-27 DIAGNOSIS — I10 Essential (primary) hypertension: Secondary | ICD-10-CM | POA: Diagnosis present

## 2013-04-27 DIAGNOSIS — I509 Heart failure, unspecified: Secondary | ICD-10-CM | POA: Diagnosis present

## 2013-04-27 DIAGNOSIS — R0902 Hypoxemia: Secondary | ICD-10-CM

## 2013-04-27 DIAGNOSIS — M549 Dorsalgia, unspecified: Secondary | ICD-10-CM | POA: Diagnosis present

## 2013-04-27 DIAGNOSIS — G2581 Restless legs syndrome: Secondary | ICD-10-CM | POA: Diagnosis present

## 2013-04-27 DIAGNOSIS — R6520 Severe sepsis without septic shock: Secondary | ICD-10-CM | POA: Diagnosis present

## 2013-04-27 DIAGNOSIS — Z8249 Family history of ischemic heart disease and other diseases of the circulatory system: Secondary | ICD-10-CM

## 2013-04-27 DIAGNOSIS — R531 Weakness: Secondary | ICD-10-CM | POA: Diagnosis present

## 2013-04-27 DIAGNOSIS — I5033 Acute on chronic diastolic (congestive) heart failure: Secondary | ICD-10-CM

## 2013-04-27 DIAGNOSIS — E860 Dehydration: Secondary | ICD-10-CM | POA: Diagnosis present

## 2013-04-27 DIAGNOSIS — J9601 Acute respiratory failure with hypoxia: Secondary | ICD-10-CM

## 2013-04-27 DIAGNOSIS — A419 Sepsis, unspecified organism: Principal | ICD-10-CM

## 2013-04-27 DIAGNOSIS — J441 Chronic obstructive pulmonary disease with (acute) exacerbation: Secondary | ICD-10-CM | POA: Diagnosis present

## 2013-04-27 DIAGNOSIS — F329 Major depressive disorder, single episode, unspecified: Secondary | ICD-10-CM | POA: Diagnosis present

## 2013-04-27 DIAGNOSIS — J189 Pneumonia, unspecified organism: Secondary | ICD-10-CM

## 2013-04-27 DIAGNOSIS — R000 Tachycardia, unspecified: Secondary | ICD-10-CM

## 2013-04-27 DIAGNOSIS — F17200 Nicotine dependence, unspecified, uncomplicated: Secondary | ICD-10-CM | POA: Diagnosis present

## 2013-04-27 DIAGNOSIS — I5032 Chronic diastolic (congestive) heart failure: Secondary | ICD-10-CM

## 2013-04-27 DIAGNOSIS — F411 Generalized anxiety disorder: Secondary | ICD-10-CM | POA: Diagnosis present

## 2013-04-27 DIAGNOSIS — M129 Arthropathy, unspecified: Secondary | ICD-10-CM | POA: Diagnosis present

## 2013-04-27 DIAGNOSIS — Z885 Allergy status to narcotic agent status: Secondary | ICD-10-CM

## 2013-04-27 DIAGNOSIS — Z886 Allergy status to analgesic agent status: Secondary | ICD-10-CM

## 2013-04-27 DIAGNOSIS — M1712 Unilateral primary osteoarthritis, left knee: Secondary | ICD-10-CM

## 2013-04-27 LAB — RAPID URINE DRUG SCREEN, HOSP PERFORMED
Amphetamines: NOT DETECTED
Barbiturates: NOT DETECTED
Cocaine: NOT DETECTED
Tetrahydrocannabinol: NOT DETECTED

## 2013-04-27 LAB — CBC WITH DIFFERENTIAL/PLATELET
Basophils Absolute: 0 10*3/uL (ref 0.0–0.1)
Basophils Relative: 1 % (ref 0–1)
Basophils Relative: 0 % (ref 0–1)
Eosinophils Absolute: 0.1 10*3/uL (ref 0.0–0.7)
Eosinophils Relative: 1 % (ref 0–5)
HCT: 42.7 % (ref 36.0–46.0)
HCT: 40.6 % (ref 36.0–46.0)
Hemoglobin: 15 g/dL (ref 12.0–15.0)
Hemoglobin: 14 g/dL (ref 12.0–15.0)
Lymphocytes Relative: 26 % (ref 12–46)
Lymphocytes Relative: 27 % (ref 12–46)
Lymphs Abs: 2.1 K/uL (ref 0.7–4.0)
MCH: 33 pg (ref 26.0–34.0)
MCHC: 35.1 g/dL (ref 30.0–36.0)
MCHC: 34.5 g/dL (ref 30.0–36.0)
MCV: 93.8 fL (ref 78.0–100.0)
Monocytes Absolute: 0.6 K/uL (ref 0.1–1.0)
Monocytes Absolute: 0.6 10*3/uL (ref 0.1–1.0)
Monocytes Relative: 7 % (ref 3–12)
Monocytes Relative: 8 % (ref 3–12)
Neutro Abs: 5.3 K/uL (ref 1.7–7.7)
Neutro Abs: 4.8 10*3/uL (ref 1.7–7.7)
Neutrophils Relative %: 65 % (ref 43–77)
Platelets: 182 10*3/uL (ref 150–400)
RBC: 4.55 MIL/uL (ref 3.87–5.11)
RDW: 13.8 % (ref 11.5–15.5)
WBC: 8.1 K/uL (ref 4.0–10.5)

## 2013-04-27 LAB — COMPREHENSIVE METABOLIC PANEL
ALT: 19 U/L (ref 0–35)
AST: 40 U/L — ABNORMAL HIGH (ref 0–37)
Alkaline Phosphatase: 18 U/L — ABNORMAL LOW (ref 39–117)
BUN: 31 mg/dL — ABNORMAL HIGH (ref 6–23)
CO2: 32 mEq/L (ref 19–32)
CO2: 30 mEq/L (ref 19–32)
Chloride: 99 mEq/L (ref 96–112)
Chloride: 100 mEq/L (ref 96–112)
Creatinine, Ser: 1.83 mg/dL — ABNORMAL HIGH (ref 0.50–1.10)
Creatinine, Ser: 1.72 mg/dL — ABNORMAL HIGH (ref 0.50–1.10)
GFR calc non Af Amer: 30 mL/min — ABNORMAL LOW (ref >90)
GFR calc non Af Amer: 32 mL/min — ABNORMAL LOW (ref >90)
Glucose, Bld: 93 mg/dL (ref 70–99)
Total Bilirubin: 0.4 mg/dL (ref 0.3–1.2)
Total Bilirubin: 0.4 mg/dL (ref 0.3–1.2)

## 2013-04-27 LAB — URINE MICROSCOPIC-ADD ON

## 2013-04-27 LAB — CG4 I-STAT (LACTIC ACID): Lactic Acid, Venous: 1.1 mmol/L (ref 0.5–2.2)

## 2013-04-27 LAB — URINALYSIS, ROUTINE W REFLEX MICROSCOPIC
Bilirubin Urine: NEGATIVE
Ketones, ur: NEGATIVE mg/dL
Nitrite: POSITIVE — AB
Urobilinogen, UA: 0.2 mg/dL (ref 0.0–1.0)

## 2013-04-27 LAB — POCT I-STAT TROPONIN I: Troponin i, poc: 0 ng/mL (ref 0.00–0.08)

## 2013-04-27 LAB — COMPREHENSIVE METABOLIC PANEL WITH GFR
Albumin: 3.4 g/dL — ABNORMAL LOW (ref 3.5–5.2)
BUN: 32 mg/dL — ABNORMAL HIGH (ref 6–23)
Calcium: 9.5 mg/dL (ref 8.4–10.5)
GFR calc Af Amer: 34 mL/min — ABNORMAL LOW (ref >90)
Glucose, Bld: 102 mg/dL — ABNORMAL HIGH (ref 70–99)
Potassium: 4.3 meq/L (ref 3.5–5.1)
Sodium: 138 meq/L (ref 135–145)
Total Protein: 6.8 g/dL (ref 6.0–8.3)

## 2013-04-27 MED ORDER — SODIUM CHLORIDE 0.9 % IV BOLUS (SEPSIS)
1000.0000 mL | Freq: Once | INTRAVENOUS | Status: AC
  Administered 2013-04-27: 1000 mL via INTRAVENOUS

## 2013-04-27 MED ORDER — IOHEXOL 300 MG/ML  SOLN
50.0000 mL | Freq: Once | INTRAMUSCULAR | Status: AC | PRN
  Administered 2013-04-27: 50 mL via ORAL

## 2013-04-27 MED ORDER — CEFTRIAXONE SODIUM 1 G IJ SOLR
1.0000 g | Freq: Once | INTRAMUSCULAR | Status: DC
  Filled 2013-04-27: qty 10

## 2013-04-27 MED ORDER — DEXTROSE 5 % IV SOLN
1.0000 g | Freq: Once | INTRAVENOUS | Status: AC
  Administered 2013-04-27: 1 g via INTRAVENOUS
  Filled 2013-04-27: qty 10

## 2013-04-27 NOTE — ED Notes (Signed)
Patient resting easily arousable.

## 2013-04-27 NOTE — ED Provider Notes (Signed)
I saw and evaluated the patient, reviewed the resident's note and I agree with the findings and plan.  Pt with persistent hypotension, not tachycardic, lactic acid is normal, doesn't appear toxic.  UTI present, abd is soft, no guard or rebound.  I suspect multifactorial, possibly medical overdose, infection, chronic opioid use may all be contributing.  Will admit for further evaluation for hypotension.  IV rocephin given.  Pt is agreeable to admission.    ECG at time 19:34 shows SR at rate 59, normal axis, normal intervals, no ST abn's.  Normal ECG.  Impression: Hypotension UTI Chronic pain   Gavin Pound. Hira Trent, MD 04/27/13 2352

## 2013-04-27 NOTE — ED Provider Notes (Signed)
History     CSN: [id number]  Arrival date & time 04/27/13  1803   First MD Initiated Contact with Patient 04/27/13 1929      Chief Complaint  Patient presents with  . Hypotension    (Consider location/radiation/quality/duration/timing/severity/associated sxs/prior treatment) HPI Comments: 57 y.o. female who has a pmh of copd (not on chronic prednisone therapy), chronic back pain, htn, presents to the ER w/ the cc of hypotension. Pt was at her pcp's office approx 4 days ago -- there she was found to be hypotensive.  At that time, benecar and another BP med pt cannot recall were promptly stopped.  Pt returned to her pcp's office today and found to be hypotensive. She has not had fevers. Her only complaint is crampy abd pain and overall weakness. She has not had chills.   Patient is a 57 y.o. female presenting with general illness. The history is provided by the patient.  Illness  The current episode started 3 to 5 days ago. The onset was gradual. The problem occurs rarely. The problem has been unchanged. The problem is mild. Nothing relieves the symptoms. Nothing aggravates the symptoms. Associated symptoms include abdominal pain. Pertinent negatives include no fever, no eye itching, no diarrhea, no nausea, no vomiting, no headaches, no stridor, no neck pain, no cough, no wheezing, no rash, no eye discharge and no eye pain.    Past Medical History  Diagnosis Date  . Dyslipidemia   . Anxiety   . Depression   . COPD (chronic obstructive pulmonary disease)     no inhalers--smoker, no oxygen  . Shortness of breath     sometimes with exertion  . Diabetes mellitus     no meds-diet controlled  . GERD (gastroesophageal reflux disease)   . 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  no seizure since--pt does take keppra and office note from  neurologist dr. Marjory Lies on this chart  . Hypertension 04/19/11    nuclear stress test was done as part of clearance for planned knee surg in sept 2012--negative study.-no ischemia  . Restless leg syndrome   . Hypertension   . Hyperlipidemia   . Arthritis   . COPD (chronic obstructive pulmonary disease)     Past Surgical History  Procedure Laterality Date  . Laminectomy and diskectomy      L4-5 with fusion  . Knee surgery      right  . Carpal tunnel release      left  . Joint replacement  08/26/2011     right total knee arthroplasty  . Knee arthroplasty  10/28/2011    Procedure: COMPUTER ASSISTED TOTAL KNEE ARTHROPLASTY;  Surgeon: Carlisle Beers Rendall III;  Location: [facility] ORS;  Service: Orthopedics;  Laterality: Left;  preop femoral nerve block  . Total knee arthroplasty      Family History  Problem Relation Age of Onset  . Heart attack Father     History  Substance Use Topics  . Smoking status: Current Every Day Smoker -- 1.00 packs/day for 40 years    Types: Cigarettes  . Smokeless tobacco: Not on file  . Alcohol Use: Yes     Comment: ocasionally    OB History   Grav Para Term Preterm Abortions TAB SAB Ect Mult Living  Review of Systems  Constitutional: Positive for fatigue. Negative for fever and chills.  HENT: Negative for facial swelling, drooling, neck pain and dental problem.   Eyes: Negative for pain, discharge and itching.  Respiratory: Negative for cough, choking, wheezing and stridor.   Cardiovascular: Negative for chest pain.  Gastrointestinal: Positive for abdominal pain. Negative for nausea, vomiting and diarrhea.  Endocrine: Negative for cold intolerance and heat intolerance.  Genitourinary: Negative for vaginal discharge, difficulty urinating and vaginal pain.  Skin: Negative for pallor and rash.  Neurological: Positive for weakness. Negative for dizziness, light-headedness and headaches.  Psychiatric/Behavioral: Negative for behavioral  problems and agitation.    Allergies  Acetaminophen; Aspirin; Nsaids; and Vicodin  Home Medications   Current Outpatient Rx  Name  Route  Sig  Dispense  Refill  . albuterol (PROVENTIL HFA;VENTOLIN HFA) 108 (90 BASE) MCG/ACT inhaler   Inhalation   Inhale 2 puffs into the lungs every 6 (six) hours as needed. For shortness of breath/wheezing.         Marland Kitchen ALPRAZolam (XANAX) 0.5 MG tablet   Oral   Take 0.5 mg by mouth 3 (three) times daily as needed. Anxiety          . aspirin EC 81 MG tablet   Oral   Take 81 mg by mouth daily.         . Calcium Carb-Cholecalciferol (OS-CAL 500 + D) 500-600 MG-UNIT TABS   Oral   Take 1 tablet by mouth daily.          Marland Kitchen escitalopram (LEXAPRO) 20 MG tablet   Oral   Take 40 mg by mouth daily.         Marland Kitchen esomeprazole (NEXIUM) 40 MG capsule   Oral   Take 40 mg by mouth daily before breakfast.          . fenofibrate micronized (LOFIBRA) 134 MG capsule   Oral   Take 134 mg by mouth daily before breakfast.          . fexofenadine (ALLEGRA) 180 MG tablet   Oral   Take 180 mg by mouth daily.         . furosemide (LASIX) 40 MG tablet   Oral   Take 1 tablet (40 mg total) by mouth daily.   30 tablet   1   . levofloxacin (LEVAQUIN) 750 MG tablet   Oral   Take 1 tablet (750 mg total) by mouth daily.   5 tablet   0   . methocarbamol (ROBAXIN) 500 MG tablet      Take 1-2 tabs po Q6hrprn spasms   60 tablet   0   . Multiple Vitamin (MULTIVITAMIN WITH MINERALS) TABS   Oral   Take 1 tablet by mouth daily.         . nebivolol (BYSTOLIC) 5 MG tablet   Oral   Take 5 mg by mouth daily.          Marland Kitchen olmesartan (BENICAR) 40 MG tablet   Oral   Take 40 mg by mouth daily.         Marland Kitchen oxyCODONE 20 MG TABS   Oral   Take 1 tablet (20 mg total) by mouth every 4 (four) hours as needed.   15 tablet   0   . potassium chloride (K-DUR) 10 MEQ tablet   Oral   Take 2 tablets (20 mEq total) by mouth 2 (two) times daily.   30 tablet    1   .  pregabalin (LYRICA) 50 MG capsule   Oral   Take 50 mg by mouth 2 (two) times daily.          Marland Kitchen rOPINIRole (REQUIP) 1 MG tablet   Oral   Take 2 mg by mouth at bedtime.           BP 89/50  Pulse 70  Temp(Src) 98.2 F (36.8 C) (Oral)  Resp 18  SpO2 92%  Physical Exam  Constitutional: She is oriented to person, place, and time. She appears well-developed. No distress.  HENT:  Head: Normocephalic and atraumatic.  Eyes: Pupils are equal, round, and reactive to light. Right eye exhibits no discharge. Left eye exhibits no discharge.  Neck: Neck supple. No tracheal deviation present.  Cardiovascular: Normal rate.  Exam reveals no gallop and no friction rub.   Pulmonary/Chest: No stridor. No respiratory distress. She has no wheezes.  Abdominal: Soft. She exhibits no distension. Tenderness: minimal periumbical ttp. There is no rebound.  Musculoskeletal: She exhibits no edema and no tenderness.  Neurological: She is alert and oriented to person, place, and time.  Skin: Skin is warm. She is not diaphoretic.    ED Course  Procedures (including critical care time)  Labs Reviewed  COMPREHENSIVE METABOLIC PANEL - Abnormal; Notable for the following:    Glucose, Bld 102 (*)    BUN 32 (*)    Creatinine, Ser 1.83 (*)    Albumin 3.4 (*)    AST 40 (*)    Alkaline Phosphatase 18 (*)    GFR calc non Af Amer 30 (*)    GFR calc Af Amer 34 (*)    All other components within normal limits  CBC WITH DIFFERENTIAL  URINALYSIS, ROUTINE W REFLEX MICROSCOPIC   Dg Chest 2 View  04/27/2013   *RADIOLOGY REPORT*  Clinical Data: Hypotension and chest pain  CHEST - 2 VIEW  Comparison: 01/29/2014and 03/13/2010  Findings: Heart size is upper normal and stable.  Mild prominence of the central pulmonary arteries is stable. Pulmonary vascularity is normal.  The lungs are clear.  There is no pleural effusion.  IMPRESSION: No acute cardiopulmonary disease.   Original Report Authenticated By: Britta Mccreedy, M.D.      MDM   Unclear etiology of why pt is hypotensive. Pt does have a UTI -- she is given rocephin -- but is not febrile, has no leukocytosis. She is not on chronic steroid therapy. Lactic acid is normal -- do not suspect she is in sepsis or septic shock. Her kidneys are hypoperfusing secondary to increased Cr and BUN. Pt is mentating very well -- she is ambulating on her own in the Er hallways with ease -- and her only complaint is that she would like her dose of pain medicine for the night.  We have consulted medicine team for admission -- pt's CT abd pelvis pending -- pt will be admitted to the step down unit.   1. Hypotension            Bernadene Person, MD 04/28/13 0003

## 2013-04-27 NOTE — ED Notes (Signed)
Pt sent here for hypotension; pt sts generalized weakness and normally has htn; pt sts started on Monday and stopped BP meds; pt denies other complaint

## 2013-04-27 NOTE — ED Notes (Signed)
Amb to the BR without difficulty.  Denies dizziness.  Lungs clear, HRR, 2= pitting edema in her lower ext bilaterally.  Small red petechiac noted to the feet.

## 2013-04-27 NOTE — ED Notes (Signed)
Pulse checked radial 61

## 2013-04-27 NOTE — ED Notes (Signed)
Amb to the BR without difficulty.

## 2013-04-28 ENCOUNTER — Encounter (HOSPITAL_COMMUNITY): Payer: Self-pay | Admitting: *Deleted

## 2013-04-28 ENCOUNTER — Inpatient Hospital Stay (HOSPITAL_COMMUNITY): Payer: PRIVATE HEALTH INSURANCE

## 2013-04-28 DIAGNOSIS — J441 Chronic obstructive pulmonary disease with (acute) exacerbation: Secondary | ICD-10-CM

## 2013-04-28 DIAGNOSIS — A419 Sepsis, unspecified organism: Secondary | ICD-10-CM

## 2013-04-28 DIAGNOSIS — R531 Weakness: Secondary | ICD-10-CM | POA: Diagnosis present

## 2013-04-28 DIAGNOSIS — N179 Acute kidney failure, unspecified: Secondary | ICD-10-CM | POA: Diagnosis present

## 2013-04-28 DIAGNOSIS — I5032 Chronic diastolic (congestive) heart failure: Secondary | ICD-10-CM

## 2013-04-28 DIAGNOSIS — N390 Urinary tract infection, site not specified: Secondary | ICD-10-CM | POA: Diagnosis present

## 2013-04-28 DIAGNOSIS — I959 Hypotension, unspecified: Secondary | ICD-10-CM

## 2013-04-28 LAB — CBC
HCT: 39.3 % (ref 36.0–46.0)
Hemoglobin: 13.2 g/dL (ref 12.0–15.0)
MCH: 31.5 pg (ref 26.0–34.0)
MCV: 93.8 fL (ref 78.0–100.0)
RBC: 4.19 MIL/uL (ref 3.87–5.11)

## 2013-04-28 LAB — BASIC METABOLIC PANEL
CO2: 28 mEq/L (ref 19–32)
Glucose, Bld: 81 mg/dL (ref 70–99)
Potassium: 3.8 mEq/L (ref 3.5–5.1)
Sodium: 138 mEq/L (ref 135–145)

## 2013-04-28 LAB — CORTISOL: Cortisol, Plasma: 5.1 ug/dL

## 2013-04-28 LAB — TSH: TSH: 1.075 u[IU]/mL (ref 0.350–4.500)

## 2013-04-28 MED ORDER — ENOXAPARIN SODIUM 40 MG/0.4ML ~~LOC~~ SOLN
40.0000 mg | SUBCUTANEOUS | Status: DC
  Administered 2013-04-28 – 2013-04-30 (×3): 40 mg via SUBCUTANEOUS
  Filled 2013-04-28 (×3): qty 0.4

## 2013-04-28 MED ORDER — PIPERACILLIN-TAZOBACTAM 3.375 G IVPB
3.3750 g | Freq: Three times a day (TID) | INTRAVENOUS | Status: DC
  Administered 2013-04-28 – 2013-04-30 (×7): 3.375 g via INTRAVENOUS
  Filled 2013-04-28 (×8): qty 50

## 2013-04-28 MED ORDER — PREGABALIN 25 MG PO CAPS
50.0000 mg | ORAL_CAPSULE | Freq: Two times a day (BID) | ORAL | Status: DC
  Administered 2013-04-28 – 2013-04-30 (×5): 50 mg via ORAL
  Filled 2013-04-28 (×5): qty 2

## 2013-04-28 MED ORDER — OXYCODONE HCL 5 MG PO TABS
5.0000 mg | ORAL_TABLET | ORAL | Status: DC | PRN
  Administered 2013-04-28: 5 mg via ORAL
  Administered 2013-04-29 (×2): 10 mg via ORAL
  Filled 2013-04-28: qty 1
  Filled 2013-04-28 (×2): qty 2

## 2013-04-28 MED ORDER — DEXTROSE 5 % IV SOLN
1.0000 g | INTRAVENOUS | Status: DC
  Filled 2013-04-28: qty 10

## 2013-04-28 MED ORDER — PIPERACILLIN-TAZOBACTAM 3.375 G IVPB 30 MIN: 3.3750 g | Freq: Three times a day (TID) | INTRAVENOUS | Status: DC

## 2013-04-28 MED ORDER — ACETAMINOPHEN 325 MG PO TABS: 650.0000 mg | ORAL_TABLET | Freq: Four times a day (QID) | ORAL | Status: DC | PRN

## 2013-04-28 MED ORDER — OXYCODONE HCL 5 MG PO TABS
5.0000 mg | ORAL_TABLET | ORAL | Status: DC | PRN
  Administered 2013-04-28 (×3): 5 mg via ORAL
  Filled 2013-04-28 (×3): qty 1

## 2013-04-28 MED ORDER — ROPINIROLE HCL 1 MG PO TABS
2.0000 mg | ORAL_TABLET | Freq: Every day | ORAL | Status: DC
  Administered 2013-04-28 – 2013-04-29 (×2): 2 mg via ORAL
  Filled 2013-04-28 (×3): qty 2

## 2013-04-28 MED ORDER — IPRATROPIUM BROMIDE 0.02 % IN SOLN: 0.5000 mg | RESPIRATORY_TRACT | Status: DC | PRN

## 2013-04-28 MED ORDER — ONDANSETRON HCL 4 MG/2ML IJ SOLN
4.0000 mg | Freq: Four times a day (QID) | INTRAMUSCULAR | Status: DC | PRN
  Administered 2013-04-28: 4 mg via INTRAVENOUS
  Filled 2013-04-28: qty 2

## 2013-04-28 MED ORDER — SODIUM CHLORIDE 0.9 % IV SOLN
INTRAVENOUS | Status: DC
  Administered 2013-04-28 (×2): via INTRAVENOUS

## 2013-04-28 MED ORDER — CALCIUM CARBONATE 1250 (500 CA) MG PO TABS
1.0000 | ORAL_TABLET | Freq: Every day | ORAL | Status: DC
  Administered 2013-04-28 – 2013-04-30 (×3): 500 mg via ORAL
  Filled 2013-04-28 (×4): qty 1

## 2013-04-28 MED ORDER — ALBUTEROL SULFATE (5 MG/ML) 0.5% IN NEBU: 2.5000 mg | INHALATION_SOLUTION | RESPIRATORY_TRACT | Status: DC | PRN

## 2013-04-28 MED ORDER — ACETAMINOPHEN 650 MG RE SUPP: 650.0000 mg | Freq: Four times a day (QID) | RECTAL | Status: DC | PRN

## 2013-04-28 MED ORDER — ONDANSETRON HCL 4 MG PO TABS: 4.0000 mg | ORAL_TABLET | Freq: Four times a day (QID) | ORAL | Status: DC | PRN

## 2013-04-28 MED ORDER — ALPRAZOLAM 0.5 MG PO TABS
0.5000 mg | ORAL_TABLET | Freq: Three times a day (TID) | ORAL | Status: DC | PRN
  Administered 2013-04-28 – 2013-04-30 (×2): 0.5 mg via ORAL
  Filled 2013-04-28: qty 2
  Filled 2013-04-28: qty 1

## 2013-04-28 MED ORDER — ENOXAPARIN SODIUM 30 MG/0.3ML ~~LOC~~ SOLN: 30.0000 mg | SUBCUTANEOUS | Status: DC

## 2013-04-28 MED ORDER — CHOLECALCIFEROL 10 MCG (400 UNIT) PO TABS
600.0000 [IU] | ORAL_TABLET | Freq: Every day | ORAL | Status: DC
  Administered 2013-04-28 – 2013-04-30 (×3): 600 [IU] via ORAL
  Filled 2013-04-28 (×5): qty 2

## 2013-04-28 MED ORDER — ESCITALOPRAM OXALATE 20 MG PO TABS
40.0000 mg | ORAL_TABLET | Freq: Every day | ORAL | Status: DC
  Administered 2013-04-28 – 2013-04-30 (×3): 40 mg via ORAL
  Filled 2013-04-28 (×3): qty 2

## 2013-04-28 MED ORDER — CALCIUM CARB-CHOLECALCIFEROL 500-600 MG-UNIT PO TABS: 1.0000 | ORAL_TABLET | Freq: Every day | ORAL | Status: DC

## 2013-04-28 MED ORDER — FENOFIBRATE 160 MG PO TABS
160.0000 mg | ORAL_TABLET | Freq: Every day | ORAL | Status: DC
  Administered 2013-04-28 – 2013-04-30 (×3): 160 mg via ORAL
  Filled 2013-04-28 (×3): qty 1

## 2013-04-28 MED ORDER — LORATADINE 10 MG PO TABS
10.0000 mg | ORAL_TABLET | Freq: Every day | ORAL | Status: DC
  Administered 2013-04-28 – 2013-04-30 (×3): 10 mg via ORAL
  Filled 2013-04-28 (×3): qty 1

## 2013-04-28 MED ORDER — PANTOPRAZOLE SODIUM 40 MG PO TBEC
40.0000 mg | DELAYED_RELEASE_TABLET | Freq: Every day | ORAL | Status: DC
  Administered 2013-04-28 – 2013-04-30 (×3): 40 mg via ORAL
  Filled 2013-04-28 (×3): qty 1

## 2013-04-28 MED ORDER — ZOLPIDEM TARTRATE 5 MG PO TABS: 5.0000 mg | ORAL_TABLET | Freq: Every evening | ORAL | Status: DC | PRN

## 2013-04-28 MED ORDER — ALUM & MAG HYDROXIDE-SIMETH 200-200-20 MG/5ML PO SUSP: 30.0000 mL | Freq: Four times a day (QID) | ORAL | Status: DC | PRN

## 2013-04-28 MED ORDER — HYDROMORPHONE HCL PF 1 MG/ML IJ SOLN
0.5000 mg | INTRAMUSCULAR | Status: DC | PRN
  Administered 2013-04-28: 1 mg via INTRAVENOUS
  Filled 2013-04-28: qty 1

## 2013-04-28 NOTE — Progress Notes (Signed)
[facility] PROGRESS NOTE  [name] NWG:[id number] DOB: [birth year] DOA: 04/27/2013 PCP: Rachel Ravel, MD  Brief history 57 year old female with a history of COPD, hypertension, hyperlipidemia, chronic back pain status post surgery presented to her primary care provider's office on Monday, 04/23/2013  abdominal cramping and loose stools. The patient was noted to have a low blood pressure. Her antihypertensive medications were discontinued. She went back for a recheck on 04/27/2013 and her blood pressure remained low with a systolic blood pressure in the 70s. The patient was brought to the emergency department for further evaluation. The patient was admitted to the hospital on 01/10/2013 with acute respiratory failure thought to be due to acute on chronic diastolic heart failure and started on furosemide 40 mg daily. The patient denies taking any extra opioid medications or furosemide. Patient denies any hematochezia, melena, vomiting, hematemesis, dysuria. Assessment/Plan: Sepsis secondary to UTI -Urinalysis shows TNTC WBC -Broaden antibiotic coverage pending culture data -Start patient on Zosyn Hypotension -Likely due to sepsis and dehydration from loose stools, antihypertensive medications, and furosemide -Blood pressure is gradually improving with fluid resuscitation -Continue judicious fluids given history of CHF -Urine drug screen negative for illicit substances Acute kidney injury -Discontinue furosemide -Judicious IV fluids Tobacco abuse/COPD -Tobacco cessation discussed -Aerosolized albuterol and Atrovent when necessary shortness of breath Chronic back pain -Continue oxycodone and Robaxin Anxiety/depression -Restart Lexapro -Continue alprazolam when necessary Chronic diastolic CHF -Compensated at this time -EF 55-60%, grade 1 diastolic dysfunction     Family Communication:   Pt at beside Disposition Plan:   Home when medically  stable     Antibiotics:  Zosyn 04/28/2013>>>  Ceftriaxone 04/27/2013    Procedures/Studies: Ct Abdomen Pelvis Wo Contrast  04/28/2013   *RADIOLOGY REPORT*  Clinical Data: Hypotension.  Weakness.  Abdominal cramping.  CT ABDOMEN AND PELVIS WITHOUT CONTRAST  Technique:  Multidetector CT imaging of the abdomen and pelvis was performed following the standard protocol without intravenous contrast.  Comparison: Multiple exams, including 04/25/2013 and 04/14/2010  Findings: Stable lingular scarring.  Contracted gallbladder potentially with a small amount of pericholecystic fluid.  Appendix normal.  Aortoiliac atherosclerotic calcification observed.  Trace free pelvic fluid.  The lower lumbar fusion noted, chronic fracturing of the S1 screws observed.  No additional significant findings.  IMPRESSION:  1.  Contracted gallbladder, possibly with a trace amount of pericholecystic fluid.  Correlate with tenderness along the gallbladder. 2.  Atherosclerosis. 3.  Stable lingular scarring. 4.  Trace amount of free pelvic fluid, significance uncertain.   Original Report Authenticated By: [name], M.D.   Dg Chest 2 View  04/27/2013   *RADIOLOGY REPORT*  Clinical Data: Hypotension and chest pain  CHEST - 2 VIEW  Comparison: 01/29/2014and 03/13/2010  Findings: Heart size is upper normal and stable.  Mild prominence of the central pulmonary arteries is stable. Pulmonary vascularity is normal.  The lungs are clear.  There is no pleural effusion.  IMPRESSION: No acute cardiopulmonary disease.   Original Report Authenticated By: [name], M.D.   Dg Sacrum/coccyx  04/25/2013   *RADIOLOGY REPORT*  Clinical Data: Larey Seat with lower back pain  SACRUM AND COCCYX - 2+ VIEW  Comparison: Lumbar spine films of 10/19/2012  Findings: The sacral coccygeal elements are in normal alignment. No acute fracture is seen.  The pelvic rami are intact.  The SI joints show degenerative change.  There is fracture of the screws at  S1, but this is unchanged compared to films from 2013.  IMPRESSION: No acute fracture.  Fracture of the screws at S1 was present in 2013.   Original Report Authenticated By: [name], M.D.   Dg Abd 1 View  04/25/2013   *RADIOLOGY REPORT*  Clinical Data: Patient fell on 05/13, fecal incontinence and lower abdominal pain  ABDOMEN - 1 VIEW  Comparison: Lumbar spine films of 10/19/2012  Findings: Hardware for fusion from L3 S1 again is noted.  The screws at S1 are fractured, but this was present on the films from November 2013.  Both hips appear to be in normal position with degenerative change.  No acute fracture is evident.  IMPRESSION: No acute abnormality.  Fracture of the screws at S1 was present in November 2013.  Hardware for fusion from L3-S1.   Original Report Authenticated By: [name], M.D.         Subjective: Patient is feeling better this morning. She denies any chest discomfort, shortness breath, nausea, vomiting. She has not had any loose stool since admission to the hospital. No hematochezia, no melena. No dizziness. No fevers or chills. No dysuria.  Objective: Filed Vitals:   04/28/13 0400 04/28/13 0737 04/28/13 0800 04/28/13 0838  BP: 111/70  153/71   Pulse: 54 66 61 66  Temp: 97.9 F (36.6 C)  97.9 F (36.6 C)   TempSrc: Oral  Oral   Resp: 16 18 18 17   Height:      Weight:      SpO2: 94% 92% 93% 96%    Intake/Output Summary (Last 24 hours) at 04/28/13 1610 Last data filed at 04/28/13 0900  Gross per 24 hour  Intake 2581.25 ml  Output    475 ml  Net 2106.25 ml   Weight change:  Exam:   General:  Pt is alert, follows commands appropriately, not in acute distress  HEENT: No icterus, No thrush,  [place]/AT  Cardiovascular: RRR, S1/S2, no rubs, no gallops  Respiratory: Diminished breath sounds at the bases. No wheezes. Good air movement. Otherwise clear to auscultation  Abdomen: Soft/+BS, non tender, non distended, no guarding  Extremities: trace edema, No  lymphangitis, No petechiae, No rashes, no synovitis  Data Reviewed: Basic Metabolic Panel:  Recent Labs Lab 04/27/13 1833 04/27/13 2029 04/28/13 0545  NA 138 138 138  K 4.3 3.9 3.8  CL 99 100 102  CO2 32 30 28  GLUCOSE 102* 93 81  BUN 32* 31* 25*  CREATININE 1.83* 1.72* 1.46*  CALCIUM 9.5 9.3 9.0   Liver Function Tests:  Recent Labs Lab 04/27/13 1833 04/27/13 2029  AST 40* 35  ALT 19 16  ALKPHOS 18* 16*  BILITOT 0.4 0.4  PROT 6.8 6.5  ALBUMIN 3.4* 3.3*   No results found for this basename: LIPASE, AMYLASE,  in the last 168 hours No results found for this basename: AMMONIA,  in the last 168 hours CBC:  Recent Labs Lab 04/27/13 1833 04/27/13 2029 04/28/13 0545  WBC 8.1 7.6 6.4  NEUTROABS 5.3 4.8  --   HGB 15.0 14.0 13.2  HCT 42.7 40.6 39.3  MCV 93.8 94.2 93.8  PLT 182 180 151   Cardiac Enzymes: No results found for this basename: CKTOTAL, CKMB, CKMBINDEX, TROPONINI,  in the last 168 hours BNP: No components found with this basename: POCBNP,  CBG: No results found for this basename: GLUCAP,  in the last 168 hours  Recent Results (from the past 240 hour(s))  MRSA PCR SCREENING     Status: None   Collection Time    04/28/13  2:35 AM  Result Value Range Status   MRSA by PCR NEGATIVE  NEGATIVE Final   Comment:            The GeneXpert MRSA Assay (FDA     approved for NASAL specimens     only), is one component of a     comprehensive MRSA colonization     surveillance program. It is not     intended to diagnose MRSA     infection nor to guide or     monitor treatment for     MRSA infections.     Scheduled Meds: . calcium carbonate  1 tablet Oral Q breakfast   And  . cholecalciferol  600 Units Oral Q breakfast  . cefTRIAXone (ROCEPHIN)  IV  1 g Intravenous Q24H  . enoxaparin (LOVENOX) injection  40 mg Subcutaneous Q24H  . escitalopram  40 mg Oral Daily  . fenofibrate  160 mg Oral Daily  . loratadine  10 mg Oral Daily  . pantoprazole  40 mg  Oral Daily  . pregabalin  50 mg Oral BID  . rOPINIRole  2 mg Oral QHS   Continuous Infusions: . sodium chloride 125 mL/hr at 04/28/13 0609     Aceson Labell, DO  [facility] Pager [phone]  If 7PM-7AM, please contact night-coverage [email or web address] Password [email or web address] 04/28/2013, 9:23 AM   LOS: 1 day

## 2013-04-28 NOTE — H&P (Signed)
[facility] History and Physical  [name] ZOX:[id number] DOB: November 10, 1956 DOA: 04/27/2013  Referring physician: EDP PCP: Ailene Ravel, MD  Specialists:   Chief Complaint: Weakness  HPI: [name] is a 57 y.o. female who presents to the ED with complaints of weakness and crampy ABD lower pain.   She has felt ill for the past week.  She denies having any fevers or chills or nausea vomiting or diarrhea.   In the ED she was evaluated and found to have systolic blood pressures of 80 and was also found to have a UTI and placed on IV Rocephin.   She was given IV Fluids for Fluid Resuscitation and admitted to the [facility] Unit.      Review of Systems: The patient denies anorexia, fever, weight loss, vision loss, decreased hearing, hoarseness, chest pain, syncope, dyspnea on exertion, peripheral edema, balance deficits, hemoptysis, abdominal pain, nausea, vomiting ,diarrhea, constipation, hematemesis, melena, hematochezia, severe indigestion/heartburn, hematuria, incontinence, muscle weakness, suspicious skin lesions, transient blindness, difficulty walking, depression, unusual weight change, abnormal bleeding, enlarged lymph nodes, angioedema, and breast masses.    Past Medical History  Diagnosis Date  . Dyslipidemia   . Anxiety   . Depression   . COPD (chronic obstructive pulmonary disease)     no inhalers--smoker, no oxygen  . Shortness of breath     sometimes with exertion  . Diabetes mellitus     no meds-diet controlled  . GERD (gastroesophageal reflux disease)   . 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  no seizure since--pt does take keppra and office note from neurologist dr. Marjory Lies on this chart  . Hypertension 04/19/11    nuclear stress test was done as part of clearance for planned knee surg in sept 2012--negative  study.-no ischemia  . Restless leg syndrome   . Hypertension   . Hyperlipidemia   . Arthritis   . COPD (chronic obstructive pulmonary disease)    Past Surgical History  Procedure Laterality Date  . Laminectomy and diskectomy      L4-5 with fusion  . Knee surgery      right  . Carpal tunnel release      left  . Joint replacement  08/26/2011     right total knee arthroplasty  . Knee arthroplasty  10/28/2011    Procedure: COMPUTER ASSISTED TOTAL KNEE ARTHROPLASTY;  Surgeon: Carlisle Beers Rendall III;  Location: [facility];  Service: Orthopedics;  Laterality: Left;  preop femoral nerve block  . Total knee arthroplasty       Medications:  HOME MEDS: Prior to Admission medications   Medication Sig Start Date End Date Taking? Authorizing Provider  albuterol (PROVENTIL HFA;VENTOLIN HFA) 108 (90 BASE) MCG/ACT inhaler Inhale 2 puffs into the lungs every 6 (six) hours as needed. For shortness of breath/wheezing.   Yes Historical Provider, MD  ALPRAZolam Prudy Feeler) 0.5 MG tablet Take 0.5 mg by mouth 3 (three) times daily as needed for anxiety.    Yes Historical Provider, MD  aspirin EC 81 MG tablet Take 81 mg by mouth daily.   Yes Historical Provider, MD  Calcium Carb-Cholecalciferol (OS-CAL 500 + D) 500-600 MG-UNIT TABS Take 1 tablet by mouth daily.    Yes Historical Provider, MD  escitalopram (LEXAPRO) 20 MG tablet Take 40 mg by mouth daily.   Yes Historical Provider, MD  esomeprazole (NEXIUM) 40 MG capsule Take 40 mg by mouth daily before breakfast.    Yes Historical Provider, MD  fenofibrate micronized (LOFIBRA) 134 MG capsule Take 134 mg by mouth daily before breakfast.    Yes Historical Provider, MD  fexofenadine (ALLEGRA) 180 MG tablet Take 180 mg by mouth daily.   Yes Historical Provider, MD  furosemide (LASIX) 40 MG tablet Take 1 tablet (40 mg total) by mouth daily. 01/13/13  Yes Estela Isaiah Blakes, MD  methocarbamol (ROBAXIN) 500 MG tablet Take 500-1,000 mg by mouth every 6 (six) hours as  needed (muscle spasms).   Yes Historical Provider, MD  Multiple Vitamin (MULTIVITAMIN WITH MINERALS) TABS Take 1 tablet by mouth daily.   Yes Historical Provider, MD  oxycodone (ROXICODONE) 30 MG immediate release tablet Take 30 mg by mouth 4 (four) times daily.   Yes Historical Provider, MD  potassium chloride SA (K-DUR,KLOR-CON) 20 MEQ tablet Take 20 mEq by mouth 2 (two) times daily.   Yes Historical Provider, MD  pregabalin (LYRICA) 50 MG capsule Take 50 mg by mouth 2 (two) times daily.    Yes Historical Provider, MD  rOPINIRole (REQUIP) 1 MG tablet Take 2 mg by mouth at bedtime.   Yes Historical Provider, MD    Allergies:  Allergies  Allergen Reactions  . Acetaminophen     Kidney issues  . Aspirin     Dr. York Spaniel do not take due to kidneys  . Nsaids Other (See Comments)    Pt states it messes up her kidneys  . Vicodin (Hydrocodone-Acetaminophen) Nausea And Vomiting    Social History:   reports that she has been smoking Cigarettes.  She has a 40 pack-year smoking history. She does not have any smokeless tobacco history on file. She reports that  drinks alcohol. She reports that she does not use illicit drugs.  Family History: Family History  Problem Relation Age of Onset  . Heart attack Father     Review of Systems:  The patient denies anorexia, fever, weight loss,, vision loss, decreased hearing, hoarseness, chest pain, syncope, dyspnea on exertion, peripheral edema, balance deficits, hemoptysis, abdominal pain, melena, hematochezia, severe indigestion/heartburn, hematuria, incontinence, genital sores, muscle weakness, suspicious skin lesions, transient blindness, difficulty walking, depression, unusual weight change, abnormal bleeding, enlarged lymph nodes, angioedema, and breast masses.   Physical Exam:  GEN:  Pleasant examined  and in no acute distress; cooperative with exam Filed Vitals:   04/28/13 0045 04/28/13 0100 04/28/13 0217 04/28/13 0400  BP: 97/64  137/86 111/70   Pulse: 65  56 54  Temp:   97.8 F (36.6 C) 97.9 F (36.6 C)  TempSrc:   Oral Oral  Resp:   17 16  Height:  5' 6.53" (1.69 m) 5' 6.5" (1.689 m)   Weight:  77.1 kg (169 lb 15.6 oz) 84.9 kg (187 lb 2.7 oz)   SpO2: 93%  100% 94%   Blood pressure 111/70, pulse 54, temperature 97.9 F (36.6 C), temperature source Oral, resp. rate 16, height 5' 6.5" (1.689 m), weight 84.9 kg (187 lb 2.7 oz), SpO2 94.00%. PSYCH: SHe is alert and oriented x4; does not appear anxious does not appear depressed; affect is normal HEENT: Normocephalic and Atraumatic, Mucous membranes pink; PERRLA; EOM intact; Fundi:  Benign;  No scleral icterus, Nares: Patent, Oropharynx: Clear, Edentulous or Fair Dentition, Neck:  FROM, no cervical lymphadenopathy nor thyromegaly or carotid bruit; no JVD; Breasts:: Not examined CHEST WALL: No tenderness CHEST: Normal respiration, clear to auscultation bilaterally  HEART: Regular rate and rhythm; no murmurs rubs or gallops BACK: No kyphosis or scoliosis; no CVA tenderness ABDOMEN: Positive Bowel Sounds, Scaphoid, Obese, soft non-tender; no masses, no organomegaly, no pannus; no intertriginous candida. Rectal Exam: Not done EXTREMITIES: No bone or joint deformity; age-appropriate arthropathy of the hands and knees; no cyanosis, clubbing or edema; no ulcerations. Genitalia: not examined PULSES: 2+ and symmetric SKIN: Normal hydration no rash or ulceration CNS: Cranial nerves 2-12 grossly intact no focal neurologic deficit   Labs & Imaging Results for orders placed during the hospital encounter of 04/27/13 (from the past 48 hour(s))  COMPREHENSIVE METABOLIC PANEL     Status: Abnormal   Collection Time    04/27/13  6:33 PM      Result Value Range   Sodium 138  135 - 145 mEq/L   Potassium 4.3  3.5 - 5.1 mEq/L   Chloride 99  96 - 112 mEq/L   CO2 32  19 - 32 mEq/L   Glucose, Bld 102 (*) 70 - 99 mg/dL   BUN 32 (*) 6 - 23 mg/dL   Creatinine, Ser 1.19 (*) 0.50 - 1.10 mg/dL   Calcium  9.5  8.4 - 14.7 mg/dL   Total Protein 6.8  6.0 - 8.3 g/dL   Albumin 3.4 (*) 3.5 - 5.2 g/dL   AST 40 (*) 0 - 37 U/L   ALT 19  0 - 35 U/L   Alkaline Phosphatase 18 (*) 39 - 117 U/L   Total Bilirubin 0.4  0.3 - 1.2 mg/dL   GFR calc non Af Amer 30 (*) >90 mL/min   GFR calc Af Amer 34 (*) >90 mL/min   Comment:            The eGFR has been calculated     using the CKD EPI equation.     This calculation has not been     validated in all clinical     situations.     eGFR's persistently     <90 mL/min signify     possible Chronic Kidney Disease.  CBC WITH DIFFERENTIAL     Status: None   Collection Time    04/27/13  6:33 PM      Result Value Range   WBC 8.1  4.0 - 10.5 K/uL   RBC 4.55  3.87 - 5.11 MIL/uL   Hemoglobin 15.0  12.0 - 15.0 g/dL   HCT 82.9  56.2 - 13.0 %   MCV 93.8  78.0 - 100.0 fL   MCH 33.0  26.0 - 34.0 pg   MCHC 35.1  30.0 - 36.0 g/dL   RDW 86.5  78.4 - 69.6 %   Platelets 182  150 - 400 K/uL   Neutrophils Relative % 65  43 - 77 %   Neutro Abs 5.3  1.7 - 7.7 K/uL   Lymphocytes Relative 26  12 - 46 %   Lymphs Abs 2.1  0.7 - 4.0 K/uL   Monocytes Relative 7  3 - 12 %   Monocytes Absolute 0.6  0.1 - 1.0 K/uL   Eosinophils Relative 1  0 - 5 %   Eosinophils Absolute 0.1  0.0 - 0.7 K/uL   Basophils Relative 1  0 - 1 %   Basophils Absolute 0.0  0.0 - 0.1 K/uL  URINALYSIS, ROUTINE W REFLEX MICROSCOPIC     Status: Abnormal   Collection Time    04/27/13  7:56 PM      Result Value Range  Color, Urine YELLOW  YELLOW   APPearance CLOUDY (*) CLEAR   Specific Gravity, Urine 1.014  1.005 - 1.030   pH 5.0  5.0 - 8.0   Glucose, UA NEGATIVE  NEGATIVE mg/dL   Hgb urine dipstick TRACE (*) NEGATIVE   Bilirubin Urine NEGATIVE  NEGATIVE   Ketones, ur NEGATIVE  NEGATIVE mg/dL   Protein, ur NEGATIVE  NEGATIVE mg/dL   Urobilinogen, UA 0.2  0.0 - 1.0 mg/dL   Nitrite POSITIVE (*) NEGATIVE   Leukocytes, UA LARGE (*) NEGATIVE  URINE RAPID DRUG SCREEN (HOSP PERFORMED)     Status: None    Collection Time    04/27/13  7:56 PM      Result Value Range   Opiates NONE DETECTED  NONE DETECTED   Cocaine NONE DETECTED  NONE DETECTED   Benzodiazepines NONE DETECTED  NONE DETECTED   Amphetamines NONE DETECTED  NONE DETECTED   Tetrahydrocannabinol NONE DETECTED  NONE DETECTED   Barbiturates NONE DETECTED  NONE DETECTED   Comment:            DRUG SCREEN FOR MEDICAL PURPOSES     ONLY.  IF CONFIRMATION IS NEEDED     FOR ANY PURPOSE, NOTIFY LAB     WITHIN 5 DAYS.                LOWEST DETECTABLE LIMITS     FOR URINE DRUG SCREEN     Drug Class       Cutoff (ng/mL)     Amphetamine      1000     Barbiturate      200     Benzodiazepine   200     Tricyclics       300     Opiates          300     Cocaine          300     THC              50  URINE MICROSCOPIC-ADD ON     Status: Abnormal   Collection Time    04/27/13  7:56 PM      Result Value Range   Squamous Epithelial / LPF MANY (*) RARE   WBC, UA TOO NUMEROUS TO COUNT  <3 WBC/hpf   RBC / HPF 0-2  <3 RBC/hpf   Bacteria, UA MANY (*) RARE  CORTISOL     Status: None   Collection Time    04/27/13  8:25 PM      Result Value Range   Cortisol, Plasma 5.1     Comment: (NOTE)     AM:  4.3 - 22.4 ug/dL     PM:  3.1 - 19.1 ug/dL  PRO B NATRIURETIC PEPTIDE     Status: Abnormal   Collection Time    04/27/13  8:25 PM      Result Value Range   Pro B Natriuretic peptide (BNP) 684.9 (*) 0 - 125 pg/mL  CBC WITH DIFFERENTIAL     Status: None   Collection Time    04/27/13  8:29 PM      Result Value Range   WBC 7.6  4.0 - 10.5 K/uL   RBC 4.31  3.87 - 5.11 MIL/uL   Hemoglobin 14.0  12.0 - 15.0 g/dL   HCT 47.8  29.5 - 62.1 %   MCV 94.2  78.0 - 100.0 fL   MCH 32.5  26.0 - 34.0 pg   MCHC  34.5  30.0 - 36.0 g/dL   RDW 16.1  09.6 - 04.5 %   Platelets 180  150 - 400 K/uL   Neutrophils Relative % 63  43 - 77 %   Neutro Abs 4.8  1.7 - 7.7 K/uL   Lymphocytes Relative 27  12 - 46 %   Lymphs Abs 2.1  0.7 - 4.0 K/uL   Monocytes Relative 8  3  - 12 %   Monocytes Absolute 0.6  0.1 - 1.0 K/uL   Eosinophils Relative 1  0 - 5 %   Eosinophils Absolute 0.1  0.0 - 0.7 K/uL   Basophils Relative 0  0 - 1 %   Basophils Absolute 0.0  0.0 - 0.1 K/uL  COMPREHENSIVE METABOLIC PANEL     Status: Abnormal   Collection Time    04/27/13  8:29 PM      Result Value Range   Sodium 138  135 - 145 mEq/L   Potassium 3.9  3.5 - 5.1 mEq/L   Chloride 100  96 - 112 mEq/L   CO2 30  19 - 32 mEq/L   Glucose, Bld 93  70 - 99 mg/dL   BUN 31 (*) 6 - 23 mg/dL   Creatinine, Ser 4.09 (*) 0.50 - 1.10 mg/dL   Calcium 9.3  8.4 - 81.1 mg/dL   Total Protein 6.5  6.0 - 8.3 g/dL   Albumin 3.3 (*) 3.5 - 5.2 g/dL   AST 35  0 - 37 U/L   ALT 16  0 - 35 U/L   Alkaline Phosphatase 16 (*) 39 - 117 U/L   Total Bilirubin 0.4  0.3 - 1.2 mg/dL   GFR calc non Af Amer 32 (*) >90 mL/min   GFR calc Af Amer 37 (*) >90 mL/min   Comment:            The eGFR has been calculated     using the CKD EPI equation.     This calculation has not been     validated in all clinical     situations.     eGFR's persistently     <90 mL/min signify     possible Chronic Kidney Disease.  TSH     Status: None   Collection Time    04/27/13  8:29 PM      Result Value Range   TSH 1.075  0.350 - 4.500 uIU/mL  POCT I-STAT TROPONIN I     Status: None   Collection Time    04/27/13  8:32 PM      Result Value Range   Troponin i, poc 0.00  0.00 - 0.08 ng/mL   Comment 3            Comment: Due to the release kinetics of cTnI,     a negative result within the first hours     of the onset of symptoms does not rule out     myocardial infarction with certainty.     If myocardial infarction is still suspected,     repeat the test at appropriate intervals.  CG4 I-STAT (LACTIC ACID)     Status: None   Collection Time    04/27/13  8:37 PM      Result Value Range   Lactic Acid, Venous 1.10  0.5 - 2.2 mmol/L  MRSA PCR SCREENING     Status: None   Collection Time    04/28/13  2:35 AM      Result  Value  Range   MRSA by PCR NEGATIVE  NEGATIVE   Comment:            The GeneXpert MRSA Assay (FDA     approved for NASAL specimens     only), is one component of a     comprehensive MRSA colonization     surveillance program. It is not     intended to diagnose MRSA     infection nor to guide or     monitor treatment for     MRSA infections.  BASIC METABOLIC PANEL     Status: Abnormal   Collection Time    04/28/13  5:45 AM      Result Value Range   Sodium 138  135 - 145 mEq/L   Potassium 3.8  3.5 - 5.1 mEq/L   Chloride 102  96 - 112 mEq/L   CO2 28  19 - 32 mEq/L   Glucose, Bld 81  70 - 99 mg/dL   BUN 25 (*) 6 - 23 mg/dL   Creatinine, Ser 1.61 (*) 0.50 - 1.10 mg/dL   Calcium 9.0  8.4 - 09.6 mg/dL   GFR calc non Af Amer 39 (*) >90 mL/min   GFR calc Af Amer 45 (*) >90 mL/min   Comment:            The eGFR has been calculated     using the CKD EPI equation.     This calculation has not been     validated in all clinical     situations.     eGFR's persistently     <90 mL/min signify     possible Chronic Kidney Disease.  CBC     Status: None   Collection Time    04/28/13  5:45 AM      Result Value Range   WBC 6.4  4.0 - 10.5 K/uL   RBC 4.19  3.87 - 5.11 MIL/uL   Hemoglobin 13.2  12.0 - 15.0 g/dL   HCT 04.5  40.9 - 81.1 %   MCV 93.8  78.0 - 100.0 fL   MCH 31.5  26.0 - 34.0 pg   MCHC 33.6  30.0 - 36.0 g/dL   RDW 91.4  78.2 - 95.6 %   Platelets 151  150 - 400 K/uL    Radiological Exams on Admission: Ct Abdomen Pelvis Wo Contrast  04/28/2013   *RADIOLOGY REPORT*  Clinical Data: Hypotension.  Weakness.  Abdominal cramping.  CT ABDOMEN AND PELVIS WITHOUT CONTRAST  Technique:  Multidetector CT imaging of the abdomen and pelvis was performed following the standard protocol without intravenous contrast.  Comparison: Multiple exams, including 04/25/2013 and 04/14/2010  Findings: Stable lingular scarring.  Contracted gallbladder potentially with a small amount of pericholecystic  fluid.  Appendix normal.  Aortoiliac atherosclerotic calcification observed.  Trace free pelvic fluid.  The lower lumbar fusion noted, chronic fracturing of the S1 screws observed.  No additional significant findings.  IMPRESSION:  1.  Contracted gallbladder, possibly with a trace amount of pericholecystic fluid.  Correlate with tenderness along the gallbladder. 2.  Atherosclerosis. 3.  Stable lingular scarring. 4.  Trace amount of free pelvic fluid, significance uncertain.   Original Report Authenticated By: Gaylyn Rong, M.D.   Dg Chest 2 View  04/27/2013   *RADIOLOGY REPORT*  Clinical Data: Hypotension and chest pain  CHEST - 2 VIEW  Comparison: 01/29/2014and 03/13/2010  Findings: Heart size is upper normal and stable.  Mild prominence of the central pulmonary arteries is  stable. Pulmonary vascularity is normal.  The lungs are clear.  There is no pleural effusion.  IMPRESSION: No acute cardiopulmonary disease.   Original Report Authenticated By: Britta Mccreedy, M.D.      Assessment/PlanPresent on Admission:   Primary Diagnosis:   . Hypotension Active Problems:   . UTI (urinary tract infection) . Acute renal failure (ARF) . Weakness generalized . COPD    1.   Hypotension - Multifactorial, Most Likely due to Infection, however maybe due to the ARF and poor clearance of her ARB therapy.  A Cortisol level was ordered to evaluate for Adrenal Insufficiency.  Monitor BPs, and IVFs for fluid Resuscitation.  2.   UTI-  Urine C+S sent,a dn IV rocephin ordered.    3.   ARF- monitor BUN/Cr.    4.    Generalized Weakness- Due to #1 and #2.     5.    COPD -stable albuterol Nebs PRN.    6.     DVT Prophylaxis with Lovenox.      Code Status:     FULL CODE Family Communication:    No Family Present at Bedside Disposition Plan:  Return to Home onDischarge  Time spent:  66 Minutes  Ron Parker [facility] Pager [phone]  If 7PM-7AM, please contact  night-coverage [email or web address] Password [facility] 04/28/2013, 8:05 AM

## 2013-04-29 DIAGNOSIS — N179 Acute kidney failure, unspecified: Secondary | ICD-10-CM

## 2013-04-29 DIAGNOSIS — I959 Hypotension, unspecified: Secondary | ICD-10-CM

## 2013-04-29 DIAGNOSIS — R609 Edema, unspecified: Secondary | ICD-10-CM

## 2013-04-29 DIAGNOSIS — I5032 Chronic diastolic (congestive) heart failure: Secondary | ICD-10-CM

## 2013-04-29 DIAGNOSIS — J449 Chronic obstructive pulmonary disease, unspecified: Secondary | ICD-10-CM

## 2013-04-29 DIAGNOSIS — I509 Heart failure, unspecified: Secondary | ICD-10-CM

## 2013-04-29 LAB — BASIC METABOLIC PANEL
CO2: 26 mEq/L (ref 19–32)
Calcium: 9 mg/dL (ref 8.4–10.5)
Creatinine, Ser: 1.31 mg/dL — ABNORMAL HIGH (ref 0.50–1.10)
Glucose, Bld: 94 mg/dL (ref 70–99)

## 2013-04-29 LAB — CBC
HCT: 38.5 % (ref 36.0–46.0)
MCHC: 33.8 g/dL (ref 30.0–36.0)
RBC: 4.11 MIL/uL (ref 3.87–5.11)

## 2013-04-29 MED ORDER — ADULT MULTIVITAMIN W/MINERALS CH
1.0000 | ORAL_TABLET | Freq: Every day | ORAL | Status: DC
  Administered 2013-04-29 – 2013-04-30 (×2): 1 via ORAL
  Filled 2013-04-29 (×2): qty 1

## 2013-04-29 MED ORDER — METHOCARBAMOL 500 MG PO TABS
500.0000 mg | ORAL_TABLET | Freq: Four times a day (QID) | ORAL | Status: DC | PRN
  Administered 2013-04-29: 500 mg via ORAL
  Filled 2013-04-29: qty 1

## 2013-04-29 MED ORDER — OXYCODONE HCL 5 MG PO TABS
20.0000 mg | ORAL_TABLET | ORAL | Status: DC | PRN
  Administered 2013-04-29 – 2013-04-30 (×7): 20 mg via ORAL
  Filled 2013-04-29 (×7): qty 4

## 2013-04-29 NOTE — Progress Notes (Signed)
[facility] PROGRESS NOTE  [name] ZOX:[id number] DOB: 10-Jan-1956 DOA: 04/27/2013 PCP: Ailene Ravel, MD  Brief history  57 year old female with a history of COPD, hypertension, hyperlipidemia, chronic back pain status post surgery presented to her primary care provider's office on Monday, 04/23/2013 abdominal cramping and loose stools. The patient was noted to have a low blood pressure. Her antihypertensive medications were discontinued. She went back for a recheck on 04/27/2013 and her blood pressure remained low with a systolic blood pressure in the 70s. The patient was brought to the emergency department for further evaluation. The patient was admitted to the hospital on 01/10/2013 with acute respiratory failure thought to be due to acute on chronic diastolic heart failure and started on furosemide 40 mg daily. The patient denies taking any extra opioid medications or furosemide. Patient denies any hematochezia, melena, vomiting, hematemesis, dysuria.    Assessment/Plan:  Sepsis secondary to UTI  -Urinalysis shows TNTC WBC  -Broaden antibiotic coverage pending culture data  -Continue Zosyn pending culture data Hypotension  -Likely due to sepsis and dehydration from loose stools, antihypertensive medications, and furosemide  -Blood pressure is gradually improving with fluid resuscitation  -Blood pressure has remained stable for 24 hours--> transfer to telemetry bed today (04/29/2013) -Urine drug screen negative for illicit substances  Acute kidney injury  -Discontinue furosemide  -Judicious IV fluids  Tobacco abuse/COPD  -Tobacco cessation discussed  -Aerosolized albuterol and Atrovent when necessary shortness of breath  Chronic back pain  -Continue oxycodone and Robaxin  -increase oxycodone to 20mg  q 4 hrs Anxiety/depression  -Restart Lexapro  -Continue alprazolam when necessary  Chronic diastolic CHF  -Compensated at this time  -EF 55-60%, grade 1 diastolic  dysfunction   Family Communication:   Pt at beside Disposition Plan:   Home when medically stable   Antibiotics:  Zosyn 04/28/2013>>>  Ceftriaxone 04/27/2013       Procedures/Studies: Ct Abdomen Pelvis Wo Contrast  04/28/2013   *RADIOLOGY REPORT*  Clinical Data: Hypotension.  Weakness.  Abdominal cramping.  CT ABDOMEN AND PELVIS WITHOUT CONTRAST  Technique:  Multidetector CT imaging of the abdomen and pelvis was performed following the standard protocol without intravenous contrast.  Comparison: Multiple exams, including 04/25/2013 and 04/14/2010  Findings: Stable lingular scarring.  Contracted gallbladder potentially with a small amount of pericholecystic fluid.  Appendix normal.  Aortoiliac atherosclerotic calcification observed.  Trace free pelvic fluid.  The lower lumbar fusion noted, chronic fracturing of the S1 screws observed.  No additional significant findings.  IMPRESSION:  1.  Contracted gallbladder, possibly with a trace amount of pericholecystic fluid.  Correlate with tenderness along the gallbladder. 2.  Atherosclerosis. 3.  Stable lingular scarring. 4.  Trace amount of free pelvic fluid, significance uncertain.   Original Report Authenticated By: Gaylyn Rong, M.D.   Dg Chest 2 View  04/27/2013   *RADIOLOGY REPORT*  Clinical Data: Hypotension and chest pain  CHEST - 2 VIEW  Comparison: 01/29/2014and 03/13/2010  Findings: Heart size is upper normal and stable.  Mild prominence of the central pulmonary arteries is stable. Pulmonary vascularity is normal.  The lungs are clear.  There is no pleural effusion.  IMPRESSION: No acute cardiopulmonary disease.   Original Report Authenticated By: Britta Mccreedy, M.D.   Dg Sacrum/coccyx  04/25/2013   *RADIOLOGY REPORT*  Clinical Data: Larey Seat with lower back pain  SACRUM AND COCCYX - 2+ VIEW  Comparison: Lumbar spine films of 10/19/2012  Findings: The sacral coccygeal elements are in normal alignment. No acute fracture is seen.  The pelvic  rami are intact.  The SI joints show degenerative change.  There is fracture of the screws at S1, but this is unchanged compared to films from 2013.  IMPRESSION: No acute fracture.  Fracture of the screws at S1 was present in 2013.   Original Report Authenticated By: Dwyane Dee, M.D.   Dg Abd 1 View  04/25/2013   *RADIOLOGY REPORT*  Clinical Data: Patient fell on 05/13, fecal incontinence and lower abdominal pain  ABDOMEN - 1 VIEW  Comparison: Lumbar spine films of 10/19/2012  Findings: Hardware for fusion from L3 S1 again is noted.  The screws at S1 are fractured, but this was present on the films from November 2013.  Both hips appear to be in normal position with degenerative change.  No acute fracture is evident.  IMPRESSION: No acute abnormality.  Fracture of the screws at S1 was present in November 2013.  Hardware for fusion from L3-S1.   Original Report Authenticated By: Dwyane Dee, M.D.         Subjective: Patient is feeling better. She denies any fevers, chills, chest pain, shortness breath, nausea, vomiting, diarrhea. No abdominal pain. She is feeling stronger. No dizziness.  Objective: Filed Vitals:   04/29/13 0025 04/29/13 0057 04/29/13 0433 04/29/13 0730  BP: 121/68  122/56 120/65  Pulse: 66  61 64  Temp: 98.9 F (37.2 C)  98.3 F (36.8 C) 98.9 F (37.2 C)  TempSrc: Oral  Oral Oral  Resp: 15  15 16   Height:      Weight:   82 kg (180 lb 12.4 oz)   SpO2: 93% 91% 96% 94%    Intake/Output Summary (Last 24 hours) at 04/29/13 0759 Last data filed at 04/29/13 0700  Gross per 24 hour  Intake 1757.5 ml  Output   1450 ml  Net  307.5 ml   Weight change: 4.9 kg (10 lb 12.9 oz) Exam:   General:  Pt is alert, follows commands appropriately, not in acute distress  HEENT: No icterus, No thrush,  Starr/AT  Cardiovascular: RRR, S1/S2, no rubs, no gallops  Respiratory: Bibasilar rales without any wheezing. Good air movement.  Abdomen: Soft/+BS, non tender, non distended, no  guarding  Extremities: 1+ edema right lower extremity, No lymphangitis, No petechiae, No rashes, no synovitis  Data Reviewed: Basic Metabolic Panel:  Recent Labs Lab 04/27/13 1833 04/27/13 2029 04/28/13 0545  NA 138 138 138  K 4.3 3.9 3.8  CL 99 100 102  CO2 32 30 28  GLUCOSE 102* 93 81  BUN 32* 31* 25*  CREATININE 1.83* 1.72* 1.46*  CALCIUM 9.5 9.3 9.0   Liver Function Tests:  Recent Labs Lab 04/27/13 1833 04/27/13 2029  AST 40* 35  ALT 19 16  ALKPHOS 18* 16*  BILITOT 0.4 0.4  PROT 6.8 6.5  ALBUMIN 3.4* 3.3*   No results found for this basename: LIPASE, AMYLASE,  in the last 168 hours No results found for this basename: AMMONIA,  in the last 168 hours CBC:  Recent Labs Lab 04/27/13 1833 04/27/13 2029 04/28/13 0545 04/29/13 0712  WBC 8.1 7.6 6.4 6.1  NEUTROABS 5.3 4.8  --   --   HGB 15.0 14.0 13.2 13.0  HCT 42.7 40.6 39.3 38.5  MCV 93.8 94.2 93.8 93.7  PLT 182 180 151 134*   Cardiac Enzymes: No results found for this basename: CKTOTAL, CKMB, CKMBINDEX, TROPONINI,  in the last 168 hours BNP: No components found with this basename: POCBNP,  CBG: No  results found for this basename: GLUCAP,  in the last 168 hours  Recent Results (from the past 240 hour(s))  MRSA PCR SCREENING     Status: None   Collection Time    04/28/13  2:35 AM      Result Value Range Status   MRSA by PCR NEGATIVE  NEGATIVE Final   Comment:            The GeneXpert MRSA Assay (FDA     approved for NASAL specimens     only), is one component of a     comprehensive MRSA colonization     surveillance program. It is not     intended to diagnose MRSA     infection nor to guide or     monitor treatment for     MRSA infections.     Scheduled Meds: . calcium carbonate  1 tablet Oral Q breakfast   And  . cholecalciferol  600 Units Oral Q breakfast  . enoxaparin (LOVENOX) injection  40 mg Subcutaneous Q24H  . escitalopram  40 mg Oral Daily  . fenofibrate  160 mg Oral Daily  .  loratadine  10 mg Oral Daily  . pantoprazole  40 mg Oral Daily  . piperacillin-tazobactam (ZOSYN)  IV  3.375 g Intravenous Q8H  . pregabalin  50 mg Oral BID  . rOPINIRole  2 mg Oral QHS   Continuous Infusions: . sodium chloride 50 mL/hr at 04/28/13 0946     Nicolas Sisler, DO  [facility] Pager [phone]  If 7PM-7AM, please contact night-coverage [email or web address] Password [email or web address] 04/29/2013, 7:59 AM   LOS: 2 days

## 2013-04-29 NOTE — Progress Notes (Signed)
Utilization Review Completed.   Patricia Fargo, RN, BSN Nurse Case Manager  [phone]  

## 2013-04-29 NOTE — Progress Notes (Signed)
Pt report called to [employer] on 4700.  Pt transferred via w/c, IV, tele.

## 2013-04-29 NOTE — Progress Notes (Signed)
VASCULAR LAB PRELIMINARY  PRELIMINARY  PRELIMINARY  PRELIMINARY  Right lower extremity venous duplex completed.    Preliminary report:  Right:  No evidence of DVT, superficial thrombosis, or Baker's cyst.  Khiara Shuping, RVS 04/29/2013, 10:10 AM

## 2013-04-30 DIAGNOSIS — A419 Sepsis, unspecified organism: Secondary | ICD-10-CM

## 2013-04-30 LAB — BASIC METABOLIC PANEL
BUN: 16 mg/dL (ref 6–23)
Chloride: 106 mEq/L (ref 96–112)
GFR calc Af Amer: 53 mL/min — ABNORMAL LOW (ref >90)
GFR calc non Af Amer: 46 mL/min — ABNORMAL LOW (ref >90)
Glucose, Bld: 89 mg/dL (ref 70–99)
Potassium: 3.4 mEq/L — ABNORMAL LOW (ref 3.5–5.1)
Sodium: 143 mEq/L (ref 135–145)

## 2013-04-30 LAB — PHOSPHORUS: Phosphorus: 2.4 mg/dL (ref 2.3–4.6)

## 2013-04-30 LAB — URINE CULTURE: Colony Count: >=100000

## 2013-04-30 MED ORDER — CEPHALEXIN 500 MG PO CAPS: 500.0000 mg | ORAL_CAPSULE | Freq: Three times a day (TID) | ORAL | Status: DC

## 2013-04-30 MED ORDER — CEPHALEXIN 500 MG PO CAPS
500.0000 mg | ORAL_CAPSULE | Freq: Three times a day (TID) | ORAL | Status: DC
  Administered 2013-04-30: 500 mg via ORAL
  Filled 2013-04-30 (×3): qty 1

## 2013-04-30 MED ORDER — POTASSIUM CHLORIDE 20 MEQ/15ML (10%) PO LIQD
20.0000 meq | Freq: Once | ORAL | Status: AC
  Administered 2013-04-30: 20 meq via ORAL
  Filled 2013-04-30: qty 15

## 2013-04-30 NOTE — Discharge Summary (Addendum)
Physician Discharge Summary  [name] ZOX:[id number] DOB: [birth year] DOA: 04/27/2013  PCP: Ailene Ravel, MD  Admit date: 04/27/2013 Discharge date: 04/30/2013  Recommendations for Outpatient Follow-up:  1. Pt will need to follow up with PCP in 2 weeks post discharge 2. Please obtain BMP to evaluate electrolytes and kidney function 3. Please also check CBC to evaluate Hg and Hct levels   Discharge Diagnoses:  Principal Problem:   Hypotension Active Problems:   COPD exacerbation   UTI (urinary tract infection)   Acute renal failure (ARF)   Weakness generalized   Sepsis   Chronic diastolic CHF (congestive heart failure) Sepsis secondary to UTI  -present on admission -Urinalysis shows TNTC WBC  -The patient was started empirically on Zosyn pending culture data. -Once her urine culture returned and showed Escherichia coli, the patient was changed to oral cephalexin -The patient will take cephalexin for 5 additional days after discharge Hypotension  -Likely due to sepsis and dehydration from loose stools, antihypertensive medications, and furosemide  -Blood pressure improved with fluid resuscitation  -Blood pressure has remained stable for 24 hours--> transfer to telemetry bed today (04/29/2013)  -Urine drug screen negative for illicit substances  -The patient's fluid was saline locked and the patient remained hemodynamically stable Acute kidney injury  -Discontinue furosemide  -Judicious IV fluids  -On the day of admission, the patient's serum creatinine was 1.83. On the day of discharge, her serum creatinine was 1.28. -Given with discontinuation of her fluids, the patient's renal function continued to improve -The patient was instructed to discontinue her furosemide and potassium supplementation until she follows up with her primary care physician who will advise her on future use or need to restart these medications. Tobacco abuse/COPD  -Tobacco cessation discussed   -Aerosolized albuterol and Atrovent when necessary shortness of breath  Chronic back pain  -Continue oxycodone and Robaxin  -increase oxycodone to 20mg  q 4 hrs  -The patient was instructed to restart her home dose of oxycodone at home, 30 mg every 4 hours when necessary pain Anxiety/depression  -Restart Lexapro  -Continue alprazolam when necessary  Chronic diastolic CHF  -Compensated at this time  -EF 55-60%, grade 1 diastolic dysfunction  Family Communication: husband at beside   Antibiotics:  Zosyn 04/28/2013>>> 04/30/2013 Ceftriaxone 04/27/2013   Discharge Condition: Stable  Disposition:  discharge home  Diet: Heart healthy Wt Readings from Last 3 Encounters:  04/30/13 85 kg (187 lb 6.3 oz)  01/13/13 77.1 kg (169 lb 15.6 oz)  10/31/11 78.472 kg (173 lb)    History of present illness:   57 year old female with a history of COPD, hypertension, hyperlipidemia, chronic back pain status post surgery presented to her primary care provider's office on Monday, 04/23/2013 abdominal cramping and loose stools. The patient was noted to have a low blood pressure. Her antihypertensive medications were discontinued. She went back for a recheck on 04/27/2013 and her blood pressure remained low with a systolic blood pressure in the 70s. The patient was brought to the emergency department for further evaluation. The patient was admitted to the hospital on 01/10/2013 with acute respiratory failure thought to be due to acute on chronic diastolic heart failure and started on furosemide 40 mg daily. The patient denies taking any extra opioid medications or furosemide. Patient denies any hematochezia, melena, vomiting, hematemesis, dysuria. The patient was admitted to the step down unit and started on intravenous fluids.    Discharge Exam: Filed Vitals:   04/30/13 0638  BP: 121/69  Pulse: 55  Temp: 98.4 F (36.9 C)  Resp: 20   Filed Vitals:   04/29/13 1300 04/29/13 1541 04/29/13 2150  04/30/13 0638  BP: 138/81 144/71 138/81 121/69  Pulse: 64 63 54 55  Temp: 98.5 F (36.9 C) 97.9 F (36.6 C) 98.3 F (36.8 C) 98.4 F (36.9 C)  TempSrc: Oral Oral Oral Oral  Resp: 17 19 18 20   Height:  5\' 6"  (1.676 m)    Weight:  84.46 kg (186 lb 3.2 oz)  85 kg (187 lb 6.3 oz)  SpO2:   94% 93%   General: A&O x 3, NAD, pleasant, cooperative Cardiovascular: RRR, no rub, no gallop, no S3 Respiratory: CTAB, no wheeze, no rhonchi Abdomen:soft, nontender, nondistended, positive bowel sounds Extremities: No edema, No lymphangitis, no petechiae  Discharge Instructions  Discharge Orders   Future Orders Complete By Expires     Diet - low sodium heart healthy  As directed     Discharge instructions  As directed     Comments:      Stop furosemide until you follow up with your primary care physician Stop potassium until you follow up with your primary care physician    Increase activity slowly  As directed         Medication List    STOP taking these medications       furosemide 40 MG tablet  Commonly known as:  LASIX     potassium chloride SA 20 MEQ tablet  Commonly known as:  K-DUR,KLOR-CON      TAKE these medications       albuterol 108 (90 BASE) MCG/ACT inhaler  Commonly known as:  PROVENTIL HFA;VENTOLIN HFA  Inhale 2 puffs into the lungs every 6 (six) hours as needed. For shortness of breath/wheezing.     ALPRAZolam 0.5 MG tablet  Commonly known as:  XANAX  Take 0.5 mg by mouth 3 (three) times daily as needed for anxiety.     aspirin EC 81 MG tablet  Take 81 mg by mouth daily.     cephALEXin 500 MG capsule  Commonly known as:  KEFLEX  Take 1 capsule (500 mg total) by mouth every 8 (eight) hours.     escitalopram 20 MG tablet  Commonly known as:  LEXAPRO  Take 40 mg by mouth daily.     esomeprazole 40 MG capsule  Commonly known as:  NEXIUM  Take 40 mg by mouth daily before breakfast.     fenofibrate micronized 134 MG capsule  Commonly known as:  LOFIBRA   Take 134 mg by mouth daily before breakfast.     fexofenadine 180 MG tablet  Commonly known as:  ALLEGRA  Take 180 mg by mouth daily.     methocarbamol 500 MG tablet  Commonly known as:  ROBAXIN  Take 500-1,000 mg by mouth every 6 (six) hours as needed (muscle spasms).     multivitamin with minerals Tabs  Take 1 tablet by mouth daily.     OS-CAL 500 + D 500-600 MG-UNIT Tabs  Generic drug:  Calcium Carb-Cholecalciferol  Take 1 tablet by mouth daily.     oxycodone 30 MG immediate release tablet  Commonly known as:  ROXICODONE  Take 30 mg by mouth 4 (four) times daily.     pregabalin 50 MG capsule  Commonly known as:  LYRICA  Take 50 mg by mouth 2 (two) times daily.     rOPINIRole 1 MG tablet  Commonly known as:  REQUIP  Take 2 mg by mouth  at bedtime.         The results of significant diagnostics from this hospitalization (including imaging, microbiology, ancillary and laboratory) are listed below for reference.    Significant Diagnostic Studies: Ct Abdomen Pelvis Wo Contrast  04/28/2013   *RADIOLOGY REPORT*  Clinical Data: Hypotension.  Weakness.  Abdominal cramping.  CT ABDOMEN AND PELVIS WITHOUT CONTRAST  Technique:  Multidetector CT imaging of the abdomen and pelvis was performed following the standard protocol without intravenous contrast.  Comparison: Multiple exams, including 04/25/2013 and 04/14/2010  Findings: Stable lingular scarring.  Contracted gallbladder potentially with a small amount of pericholecystic fluid.  Appendix normal.  Aortoiliac atherosclerotic calcification observed.  Trace free pelvic fluid.  The lower lumbar fusion noted, chronic fracturing of the S1 screws observed.  No additional significant findings.  IMPRESSION:  1.  Contracted gallbladder, possibly with a trace amount of pericholecystic fluid.  Correlate with tenderness along the gallbladder. 2.  Atherosclerosis. 3.  Stable lingular scarring. 4.  Trace amount of free pelvic fluid, significance  uncertain.   Original Report Authenticated By: Gaylyn Rong, M.D.   Dg Chest 2 View  04/27/2013   *RADIOLOGY REPORT*  Clinical Data: Hypotension and chest pain  CHEST - 2 VIEW  Comparison: 01/29/2014and 03/13/2010  Findings: Heart size is upper normal and stable.  Mild prominence of the central pulmonary arteries is stable. Pulmonary vascularity is normal.  The lungs are clear.  There is no pleural effusion.  IMPRESSION: No acute cardiopulmonary disease.   Original Report Authenticated By: Britta Mccreedy, M.D.   Dg Sacrum/coccyx  04/25/2013   *RADIOLOGY REPORT*  Clinical Data: Larey Seat with lower back pain  SACRUM AND COCCYX - 2+ VIEW  Comparison: Lumbar spine films of 10/19/2012  Findings: The sacral coccygeal elements are in normal alignment. No acute fracture is seen.  The pelvic rami are intact.  The SI joints show degenerative change.  There is fracture of the screws at S1, but this is unchanged compared to films from 2013.  IMPRESSION: No acute fracture.  Fracture of the screws at S1 was present in 2013.   Original Report Authenticated By: Dwyane Dee, M.D.   Dg Abd 1 View  04/25/2013   *RADIOLOGY REPORT*  Clinical Data: Patient fell on 05/13, fecal incontinence and lower abdominal pain  ABDOMEN - 1 VIEW  Comparison: Lumbar spine films of 10/19/2012  Findings: Hardware for fusion from L3 S1 again is noted.  The screws at S1 are fractured, but this was present on the films from November 2013.  Both hips appear to be in normal position with degenerative change.  No acute fracture is evident.  IMPRESSION: No acute abnormality.  Fracture of the screws at S1 was present in November 2013.  Hardware for fusion from L3-S1.   Original Report Authenticated By: Dwyane Dee, M.D.     Microbiology: Recent Results (from the past 240 hour(s))  URINE CULTURE     Status: None   Collection Time    04/27/13  7:56 PM      Result Value Range Status   Specimen Description URINE, RANDOM   Final   Special Requests  NONE   Final   Culture  Setup Time 04/28/2013 05:03   Final   Colony Count >=100,000 COLONIES/ML   Final   Culture ESCHERICHIA COLI   Final   Report Status 04/30/2013 FINAL   Final   Organism ID, Bacteria ESCHERICHIA COLI   Final  MRSA PCR SCREENING     Status: None  Collection Time    04/28/13  2:35 AM      Result Value Range Status   MRSA by PCR NEGATIVE  NEGATIVE Final   Comment:            The GeneXpert MRSA Assay (FDA     approved for NASAL specimens     only), is one component of a     comprehensive MRSA colonization     surveillance program. It is not     intended to diagnose MRSA     infection nor to guide or     monitor treatment for     MRSA infections.     Labs: Basic Metabolic Panel:  Recent Labs Lab 04/27/13 1833 04/27/13 2029 04/28/13 0545 04/29/13 0712 04/30/13 0500  NA 138 138 138 142 143  K 4.3 3.9 3.8 3.7 3.4*  CL 99 100 102 106 106  CO2 32 30 28 26  32  GLUCOSE 102* 93 81 94 89  BUN 32* 31* 25* 22 16  CREATININE 1.83* 1.72* 1.46* 1.31* 1.28*  CALCIUM 9.5 9.3 9.0 9.0 9.2  MG  --   --   --   --  1.7  PHOS  --   --   --   --  2.4   Liver Function Tests:  Recent Labs Lab 04/27/13 1833 04/27/13 2029  AST 40* 35  ALT 19 16  ALKPHOS 18* 16*  BILITOT 0.4 0.4  PROT 6.8 6.5  ALBUMIN 3.4* 3.3*   No results found for this basename: LIPASE, AMYLASE,  in the last 168 hours No results found for this basename: AMMONIA,  in the last 168 hours CBC:  Recent Labs Lab 04/27/13 1833 04/27/13 2029 04/28/13 0545 04/29/13 0712  WBC 8.1 7.6 6.4 6.1  NEUTROABS 5.3 4.8  --   --   HGB 15.0 14.0 13.2 13.0  HCT 42.7 40.6 39.3 38.5  MCV 93.8 94.2 93.8 93.7  PLT 182 180 151 134*   Cardiac Enzymes: No results found for this basename: CKTOTAL, CKMB, CKMBINDEX, TROPONINI,  in the last 168 hours BNP: No components found with this basename: POCBNP,  CBG: No results found for this basename: GLUCAP,  in the last 168 hours  Time coordinating discharge:   Greater than 30 minutes  Signed:  Joby Hershkowitz, DO [facility] Pager: [phone] 04/30/2013, 11:41 AM

## 2013-04-30 NOTE — Progress Notes (Signed)
[name] to be D/C'd Home per MD order.  Discussed with the patient and all questions fully answered.    Medication List    STOP taking these medications       furosemide 40 MG tablet  Commonly known as:  LASIX     potassium chloride SA 20 MEQ tablet  Commonly known as:  K-DUR,KLOR-CON      TAKE these medications       albuterol 108 (90 BASE) MCG/ACT inhaler  Commonly known as:  PROVENTIL HFA;VENTOLIN HFA  Inhale 2 puffs into the lungs every 6 (six) hours as needed. For shortness of breath/wheezing.     ALPRAZolam 0.5 MG tablet  Commonly known as:  XANAX  Take 0.5 mg by mouth 3 (three) times daily as needed for anxiety.     aspirin EC 81 MG tablet  Take 81 mg by mouth daily.     cephALEXin 500 MG capsule  Commonly known as:  KEFLEX  Take 1 capsule (500 mg total) by mouth every 8 (eight) hours.     escitalopram 20 MG tablet  Commonly known as:  LEXAPRO  Take 40 mg by mouth daily.     esomeprazole 40 MG capsule  Commonly known as:  NEXIUM  Take 40 mg by mouth daily before breakfast.     fenofibrate micronized 134 MG capsule  Commonly known as:  LOFIBRA  Take 134 mg by mouth daily before breakfast.     fexofenadine 180 MG tablet  Commonly known as:  ALLEGRA  Take 180 mg by mouth daily.     methocarbamol 500 MG tablet  Commonly known as:  ROBAXIN  Take 500-1,000 mg by mouth every 6 (six) hours as needed (muscle spasms).     multivitamin with minerals Tabs  Take 1 tablet by mouth daily.     OS-CAL 500 + D 500-600 MG-UNIT Tabs  Generic drug:  Calcium Carb-Cholecalciferol  Take 1 tablet by mouth daily.     oxycodone 30 MG immediate release tablet  Commonly known as:  ROXICODONE  Take 30 mg by mouth 4 (four) times daily.     pregabalin 50 MG capsule  Commonly known as:  LYRICA  Take 50 mg by mouth 2 (two) times daily.     rOPINIRole 1 MG tablet  Commonly known as:  REQUIP  Take 2 mg by mouth at bedtime.        VVS, Skin clean, dry and intact  without evidence of skin break down, no evidence of skin tears noted. IV catheter discontinued intact. Site without signs and symptoms of complications. Dressing and pressure applied.  An After Visit Summary was printed and given to the patient. Patient escorted via WC, and D/C home via private auto.  Annette Liotta 04/30/2013 2:33 PM

## 2013-05-17 ENCOUNTER — Other Ambulatory Visit (HOSPITAL_COMMUNITY): Payer: Self-pay | Admitting: Family Medicine

## 2013-05-17 DIAGNOSIS — Z1231 Encounter for screening mammogram for malignant neoplasm of breast: Secondary | ICD-10-CM

## 2013-06-05 ENCOUNTER — Ambulatory Visit (HOSPITAL_COMMUNITY): Payer: PRIVATE HEALTH INSURANCE

## 2013-07-02 ENCOUNTER — Ambulatory Visit (HOSPITAL_COMMUNITY)
Admission: RE | Admit: 2013-07-02 | Discharge: 2013-07-02 | Disposition: A | Discharge status: Home / Self Care | Payer: PRIVATE HEALTH INSURANCE | Source: Ambulatory Visit | Attending: Family Medicine | Admitting: Family Medicine

## 2013-07-02 DIAGNOSIS — Z1231 Encounter for screening mammogram for malignant neoplasm of breast: Secondary | ICD-10-CM

## 2013-07-19 ENCOUNTER — Encounter: Payer: Self-pay | Admitting: Cardiology

## 2013-07-19 NOTE — Progress Notes (Signed)
Patient ID: [name], female   DOB: 10/17/1956, 57 y.o.   MRN: [id number]   [name], [name]  Date of visit:  07/19/2013 DOB:  05/03/1950    Age:  57 yrs. Medical record number:  [id number]     Account number:  [id number] Primary Care Provider: KNAPP, EVE ____________________________ CURRENT DIAGNOSES  1. Congestive Heart Failure Chronic Diastolic  2. Prosthetic Valve Endocarditis  3. Mitral Valve Replacement  4. Surgery-Aortocoronary Bypass Grafting  5. Dyspnea  6. Long Term Use Anticoagulant  7. Arrhythmia-Atrial Fibrillation  8. Hypertensive Heart Disease-Benign without CHF  9. CAD,Native  10. Hyperlipidemia  11. Obesity(BMI30-40)  12. Asthma  13. Diabetes Mellitus-NIDD  14. Mitral Valve Disorder  15. Long Term Use of Other Medicine  16. Dyspnea  17. Stent Placement ____________________________ ALLERGIES  Amlodipine, Edema  Penicillins, Rash  Rosuvastatin, Muscle aches  Sulfa (Sulfonamides), Intolerance-unknown ____________________________ MEDICATIONS  1. Zetia 10 mg Tablet, 1 p.o. q.d.  2. nitroglycerin 0.4 mg Tablet, Sublingual, PRN  3. Symbicort 160-4.5 mcg/actuation HFA Aerosol Inhaler, 2 puff bid  4. Prevacid 30 mg capsule,delayed release(DR/EC), 1 p.o. daily  5. Ventolin HFA 90 mcg/actuation HFA Aerosol Inhaler, PRN  6. warfarin 5 mg tablet, Take as directed  7. Reglan 10 mg tablet, QID  8. Bystolic 5 mg tablet, 1 p.o. daily  9. Crestor 10 mg tablet, 2 x week  10. warfarin 4 mg tablet, one daily or as directed  11. carvedilol 12.5 mg tablet, BID  12. cephalexin 500 mg capsule, TID  13. Tessalon Perles 100 mg capsule, 1 q 8 Hours prn cough  14. furosemide 40 mg tablet, BID ____________________________ HISTORY OF PRESENT ILLNESS  Patient seen for cardiac evaluation. She has had her VAC removed and continues on cephalexin t.i.d. She had some worsening kidney function and I had her stop her furosemide. She called up her wanting some cough medicine and she developed  a nocturnal cough and I felt it best to have her come in to be checked to be sure she was not deteriorating. Since she was here she has gotten married and in addition has been laid off from her company and evidently has lost her insurance. She still has a nocturnal cough that is better with Tessalon Pearls. She thought her weight was up and asked her to restart her furosemide. She denies PND orthopnea and actually says that she feels fairly good. Her affect appears to be somewhat down but she denies depression. Now that she has her VAC out it would be good if she got involved in rehabilitation. 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 Cornelius Moras, Redo  MV replacement with tissue valve February  2014;  Cardiology Procedures-Noninvasive:  echocardiogram January 2014, TEE January 2014, TEE April 2014;  Cardiac Cath Results:  normal Left main, Widely patent LAD stent with 30-40% in-stent restenosis, 70% distal stenosis, 50% ostial first diagonal stenosis!, 70-80% second diagonal stenosis, 50-60% stenosis mid CFX, 30% stenosis mid RCA, severe pulmonary hypertension;  LVEF of 50% documented via  echocardiogram on 03/18/2012,   ____________________________ CARDIO-PULMONARY TEST DATES EKG Date:  09/16/2011;   Cardiac Cath Date:  12/19/2012;  CABG: 12/26/2012;  Stent Placement Date: 10/14/2008;  Nuclear Study Date:  05/29/2009;   Echocardiography Date: 12/18/2012;  Chest Xray Date: 07/12/2011;  CT Scan Date:  01/20/2004   ____________________________ SOCIAL HISTORY Alcohol Use:  no alcohol use;  Smoking:  never smoked;  Diet:  no added salt;  Lifestyle:  married and son died 3 years ago;  Exercise:  exercise is limited due to physical disability;  Occupation:  disabled;  Residence:  lives with husband;   ____________________________ REVIEW OF SYSTEMS General:  obesity, weight loss of approximately 10 lbs Eyes: wears eye glasses/contact lenses Respiratory:  cough. Cardiovascular:  please review HPI Abdominal: denies dyspepsia, GI bleeding, constipation, or diarrheaGenitourinary-Female: no dysuria, urgency, frequency, UTIs, or stress incontinence Musculoskeletal:  denies arthritis, venous insufficiency, or muscle weakness. Endocrine: multinodular goiter  ____________________________ PHYSICAL EXAMINATION VITAL SIGNS  Blood Pressure:  102/70 Standing, Left arm, regular cuff   Pulse:  60/min. Weight:  195.00 lbs. Height:  62"BMI: 35  Constitutional:  pleasant African American female in no acute distress, moderately obese Skin:  warm and dry to touch, no apparent skin lesions, or masses noted. Head:  normocephalic, normal hair pattern, no masses or tenderness ENT:  ears, nose and throat reveal no gross abnormalities.  Dentition good. Neck:  supple, no masses, thyromegaly, JVD. Carotid pulses are full and equal bilaterally without bruits. Chest:  clear to auscultation and percussion, bandage over sternum smaller than before Cardiac:  regular rhythm, normal S1 and S2, No S3 or S4, no murmurs, gallops or rubs detected. Peripheral Pulses:  the femoral,dorsalis pedis, and posterior tibial pulses are full and equal bilaterally with no bruits auscultated. Extremities & Back:  no edema present Neurological:  no gross motor or sensory deficits noted, affect appropriate, oriented x3. Appears mildly  depressed ____________________________ MOST RECENT LIPID PANEL 05/17/12  CHOL TOTL 187 mg/dl, LDL 161 calc, HDL 46 mg/dl, TRIGLYCER 92 mg/dl and CHOL/HDL 4.1 (Calc) ____________________________ IMPRESSIONS/PLAN  1. Previous mitral valve replacement following mediastinitis and prosthetic valve endocarditis 2. Previous history of VAC treatment for mediastinitis 3. Previous kidney dysfunction it was acute in that improved with cessation of diuretics 4. Diastolic congestive heart failure 5. History of coronary artery disease with stenting 6. Long-term anticoagulation with warfarin  Recommendations:  I would like her to have a repeat chest x-ray because of the cough and also will need to schedule her for an echocardiogram to assess her valve function. Obtain CMET,BNP and CBC. Consider rehabilitation. ____________________________ TODAYS ORDERS  1. BNP: Today  2. Comprehensive Metabolic Panel: Today  3. Complete Blood Count: Today  4. 2D, color flow, doppler: At [facility] [employer]                       ____________________________ Cardiology Physician:  Darden Palmer MD [facility]

## 2013-09-02 ENCOUNTER — Encounter (HOSPITAL_COMMUNITY): Payer: Self-pay | Admitting: Emergency Medicine

## 2013-09-02 ENCOUNTER — Emergency Department (HOSPITAL_COMMUNITY)
Admission: EM | Admit: 2013-09-02 | Discharge: 2013-09-02 | Disposition: A | Discharge status: Home / Self Care | Payer: PRIVATE HEALTH INSURANCE | Attending: Emergency Medicine | Admitting: Emergency Medicine

## 2013-09-02 DIAGNOSIS — G40909 Epilepsy, unspecified, not intractable, without status epilepticus: Secondary | ICD-10-CM | POA: Insufficient documentation

## 2013-09-02 DIAGNOSIS — K219 Gastro-esophageal reflux disease without esophagitis: Secondary | ICD-10-CM | POA: Insufficient documentation

## 2013-09-02 DIAGNOSIS — E785 Hyperlipidemia, unspecified: Secondary | ICD-10-CM | POA: Insufficient documentation

## 2013-09-02 DIAGNOSIS — R51 Headache: Secondary | ICD-10-CM | POA: Insufficient documentation

## 2013-09-02 DIAGNOSIS — R110 Nausea: Secondary | ICD-10-CM | POA: Insufficient documentation

## 2013-09-02 DIAGNOSIS — E119 Type 2 diabetes mellitus without complications: Secondary | ICD-10-CM | POA: Insufficient documentation

## 2013-09-02 DIAGNOSIS — F17200 Nicotine dependence, unspecified, uncomplicated: Secondary | ICD-10-CM | POA: Insufficient documentation

## 2013-09-02 DIAGNOSIS — R1084 Generalized abdominal pain: Secondary | ICD-10-CM | POA: Insufficient documentation

## 2013-09-02 DIAGNOSIS — F3289 Other specified depressive episodes: Secondary | ICD-10-CM | POA: Insufficient documentation

## 2013-09-02 DIAGNOSIS — R109 Unspecified abdominal pain: Secondary | ICD-10-CM

## 2013-09-02 DIAGNOSIS — G8929 Other chronic pain: Secondary | ICD-10-CM | POA: Insufficient documentation

## 2013-09-02 DIAGNOSIS — J449 Chronic obstructive pulmonary disease, unspecified: Secondary | ICD-10-CM | POA: Insufficient documentation

## 2013-09-02 DIAGNOSIS — I10 Essential (primary) hypertension: Secondary | ICD-10-CM | POA: Insufficient documentation

## 2013-09-02 DIAGNOSIS — M129 Arthropathy, unspecified: Secondary | ICD-10-CM | POA: Insufficient documentation

## 2013-09-02 DIAGNOSIS — F329 Major depressive disorder, single episode, unspecified: Secondary | ICD-10-CM | POA: Insufficient documentation

## 2013-09-02 DIAGNOSIS — J4489 Other specified chronic obstructive pulmonary disease: Secondary | ICD-10-CM | POA: Insufficient documentation

## 2013-09-02 DIAGNOSIS — Z79899 Other long term (current) drug therapy: Secondary | ICD-10-CM | POA: Insufficient documentation

## 2013-09-02 DIAGNOSIS — H538 Other visual disturbances: Secondary | ICD-10-CM | POA: Insufficient documentation

## 2013-09-02 DIAGNOSIS — Z8669 Personal history of other diseases of the nervous system and sense organs: Secondary | ICD-10-CM | POA: Insufficient documentation

## 2013-09-02 DIAGNOSIS — Z7982 Long term (current) use of aspirin: Secondary | ICD-10-CM | POA: Insufficient documentation

## 2013-09-02 DIAGNOSIS — F411 Generalized anxiety disorder: Secondary | ICD-10-CM | POA: Insufficient documentation

## 2013-09-02 LAB — CBC WITH DIFFERENTIAL/PLATELET
Basophils Absolute: 0 10*3/uL (ref 0.0–0.1)
HCT: 49.7 % — ABNORMAL HIGH (ref 36.0–46.0)
Lymphocytes Relative: 14 % (ref 12–46)
Lymphs Abs: 1.4 10*3/uL (ref 0.7–4.0)
MCV: 94.8 fL (ref 78.0–100.0)
Neutro Abs: 7.8 10*3/uL — ABNORMAL HIGH (ref 1.7–7.7)
Platelets: 217 10*3/uL (ref 150–400)
RBC: 5.24 MIL/uL — ABNORMAL HIGH (ref 3.87–5.11)
RDW: 13.9 % (ref 11.5–15.5)
WBC: 9.7 10*3/uL (ref 4.0–10.5)

## 2013-09-02 LAB — COMPREHENSIVE METABOLIC PANEL
ALT: 24 U/L (ref 0–35)
AST: 24 U/L (ref 0–37)
Alkaline Phosphatase: 14 U/L — ABNORMAL LOW (ref 39–117)
CO2: 26 mEq/L (ref 19–32)
Chloride: 100 mEq/L (ref 96–112)
GFR calc Af Amer: 72 mL/min — ABNORMAL LOW (ref >90)
GFR calc non Af Amer: 63 mL/min — ABNORMAL LOW (ref >90)
Glucose, Bld: 98 mg/dL (ref 70–99)
Sodium: 136 mEq/L (ref 135–145)
Total Bilirubin: 0.5 mg/dL (ref 0.3–1.2)

## 2013-09-02 LAB — URINALYSIS, ROUTINE W REFLEX MICROSCOPIC
Bilirubin Urine: NEGATIVE
Hgb urine dipstick: NEGATIVE
Ketones, ur: NEGATIVE mg/dL
Protein, ur: NEGATIVE mg/dL
Urobilinogen, UA: 0.2 mg/dL (ref 0.0–1.0)

## 2013-09-02 MED ORDER — METOCLOPRAMIDE HCL 5 MG/ML IJ SOLN
10.0000 mg | Freq: Once | INTRAMUSCULAR | Status: AC
  Administered 2013-09-02: 10 mg via INTRAVENOUS
  Filled 2013-09-02: qty 2

## 2013-09-02 MED ORDER — SODIUM CHLORIDE 0.9 % IV BOLUS (SEPSIS)
1000.0000 mL | Freq: Once | INTRAVENOUS | Status: AC
  Administered 2013-09-02: 1000 mL via INTRAVENOUS

## 2013-09-02 MED ORDER — HYDROMORPHONE HCL PF 1 MG/ML IJ SOLN
1.0000 mg | Freq: Once | INTRAMUSCULAR | Status: AC
  Administered 2013-09-02: 1 mg via INTRAVENOUS
  Filled 2013-09-02: qty 1

## 2013-09-02 MED ORDER — DIPHENHYDRAMINE HCL 50 MG/ML IJ SOLN
25.0000 mg | Freq: Once | INTRAMUSCULAR | Status: AC
  Administered 2013-09-02: 25 mg via INTRAVENOUS
  Filled 2013-09-02: qty 1
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
## 2014-03-12 ENCOUNTER — Other Ambulatory Visit (HOSPITAL_COMMUNITY): Payer: Self-pay | Admitting: Family Medicine

## 2014-03-12 DIAGNOSIS — Z1231 Encounter for screening mammogram for malignant neoplasm of breast: Secondary | ICD-10-CM

## 2014-03-19 ENCOUNTER — Ambulatory Visit (HOSPITAL_COMMUNITY)
Admission: RE | Admit: 2014-03-19 | Discharge: 2014-03-19 | Disposition: A | Discharge status: Home / Self Care | Payer: PRIVATE HEALTH INSURANCE | Source: Ambulatory Visit | Attending: Family Medicine | Admitting: Family Medicine

## 2014-03-19 DIAGNOSIS — Z1231 Encounter for screening mammogram for malignant neoplasm of breast: Secondary | ICD-10-CM | POA: Insufficient documentation

## 2014-04-14 ENCOUNTER — Emergency Department (HOSPITAL_COMMUNITY): Payer: PRIVATE HEALTH INSURANCE

## 2014-04-14 ENCOUNTER — Encounter (HOSPITAL_COMMUNITY): Payer: Self-pay | Admitting: Emergency Medicine

## 2014-04-14 ENCOUNTER — Inpatient Hospital Stay (HOSPITAL_COMMUNITY): Payer: PRIVATE HEALTH INSURANCE

## 2014-04-14 ENCOUNTER — Inpatient Hospital Stay (HOSPITAL_COMMUNITY)
Admission: EM | Admit: 2014-04-14 | Discharge: 2014-04-18 | DRG: 917 | Disposition: A | Discharge status: Home / Self Care | Payer: PRIVATE HEALTH INSURANCE | Attending: Internal Medicine | Admitting: Internal Medicine

## 2014-04-14 DIAGNOSIS — J4489 Other specified chronic obstructive pulmonary disease: Secondary | ICD-10-CM | POA: Diagnosis present

## 2014-04-14 DIAGNOSIS — G2581 Restless legs syndrome: Secondary | ICD-10-CM | POA: Diagnosis present

## 2014-04-14 DIAGNOSIS — R4189 Other symptoms and signs involving cognitive functions and awareness: Secondary | ICD-10-CM

## 2014-04-14 DIAGNOSIS — G40109 Localization-related (focal) (partial) symptomatic epilepsy and epileptic syndromes with simple partial seizures, not intractable, without status epilepticus: Secondary | ICD-10-CM

## 2014-04-14 DIAGNOSIS — J9601 Acute respiratory failure with hypoxia: Secondary | ICD-10-CM

## 2014-04-14 DIAGNOSIS — Z7982 Long term (current) use of aspirin: Secondary | ICD-10-CM

## 2014-04-14 DIAGNOSIS — Z6831 Body mass index (BMI) 31.0-31.9, adult: Secondary | ICD-10-CM

## 2014-04-14 DIAGNOSIS — F329 Major depressive disorder, single episode, unspecified: Secondary | ICD-10-CM | POA: Diagnosis present

## 2014-04-14 DIAGNOSIS — Z9119 Patient's noncompliance with other medical treatment and regimen: Secondary | ICD-10-CM

## 2014-04-14 DIAGNOSIS — F17200 Nicotine dependence, unspecified, uncomplicated: Secondary | ICD-10-CM | POA: Diagnosis present

## 2014-04-14 DIAGNOSIS — T4271XA Poisoning by unspecified antiepileptic and sedative-hypnotic drugs, accidental (unintentional), initial encounter: Principal | ICD-10-CM | POA: Diagnosis present

## 2014-04-14 DIAGNOSIS — I10 Essential (primary) hypertension: Secondary | ICD-10-CM

## 2014-04-14 DIAGNOSIS — Z96659 Presence of unspecified artificial knee joint: Secondary | ICD-10-CM

## 2014-04-14 DIAGNOSIS — J9600 Acute respiratory failure, unspecified whether with hypoxia or hypercapnia: Secondary | ICD-10-CM | POA: Diagnosis present

## 2014-04-14 DIAGNOSIS — Z79899 Other long term (current) drug therapy: Secondary | ICD-10-CM

## 2014-04-14 DIAGNOSIS — E872 Acidosis, unspecified: Secondary | ICD-10-CM | POA: Diagnosis present

## 2014-04-14 DIAGNOSIS — K219 Gastro-esophageal reflux disease without esophagitis: Secondary | ICD-10-CM | POA: Diagnosis present

## 2014-04-14 DIAGNOSIS — J449 Chronic obstructive pulmonary disease, unspecified: Secondary | ICD-10-CM

## 2014-04-14 DIAGNOSIS — T50901A Poisoning by unspecified drugs, medicaments and biological substances, accidental (unintentional), initial encounter: Secondary | ICD-10-CM | POA: Diagnosis present

## 2014-04-14 DIAGNOSIS — E46 Unspecified protein-calorie malnutrition: Secondary | ICD-10-CM | POA: Diagnosis present

## 2014-04-14 DIAGNOSIS — I5030 Unspecified diastolic (congestive) heart failure: Secondary | ICD-10-CM | POA: Diagnosis present

## 2014-04-14 DIAGNOSIS — A419 Sepsis, unspecified organism: Secondary | ICD-10-CM

## 2014-04-14 DIAGNOSIS — M129 Arthropathy, unspecified: Secondary | ICD-10-CM | POA: Diagnosis present

## 2014-04-14 DIAGNOSIS — G40909 Epilepsy, unspecified, not intractable, without status epilepticus: Secondary | ICD-10-CM

## 2014-04-14 DIAGNOSIS — G40209 Localization-related (focal) (partial) symptomatic epilepsy and epileptic syndromes with complex partial seizures, not intractable, without status epilepticus: Secondary | ICD-10-CM | POA: Diagnosis present

## 2014-04-14 DIAGNOSIS — Z981 Arthrodesis status: Secondary | ICD-10-CM

## 2014-04-14 DIAGNOSIS — F411 Generalized anxiety disorder: Secondary | ICD-10-CM | POA: Diagnosis present

## 2014-04-14 DIAGNOSIS — N179 Acute kidney failure, unspecified: Secondary | ICD-10-CM | POA: Diagnosis present

## 2014-04-14 DIAGNOSIS — I509 Heart failure, unspecified: Secondary | ICD-10-CM | POA: Diagnosis present

## 2014-04-14 DIAGNOSIS — Z91199 Patient's noncompliance with other medical treatment and regimen due to unspecified reason: Secondary | ICD-10-CM

## 2014-04-14 DIAGNOSIS — N390 Urinary tract infection, site not specified: Secondary | ICD-10-CM | POA: Diagnosis present

## 2014-04-14 DIAGNOSIS — E119 Type 2 diabetes mellitus without complications: Secondary | ICD-10-CM | POA: Diagnosis present

## 2014-04-14 DIAGNOSIS — E785 Hyperlipidemia, unspecified: Secondary | ICD-10-CM | POA: Diagnosis present

## 2014-04-14 DIAGNOSIS — G894 Chronic pain syndrome: Secondary | ICD-10-CM | POA: Diagnosis present

## 2014-04-14 DIAGNOSIS — F3289 Other specified depressive episodes: Secondary | ICD-10-CM | POA: Diagnosis present

## 2014-04-14 DIAGNOSIS — J9690 Respiratory failure, unspecified, unspecified whether with hypoxia or hypercapnia: Secondary | ICD-10-CM | POA: Insufficient documentation

## 2014-04-14 DIAGNOSIS — I959 Hypotension, unspecified: Secondary | ICD-10-CM | POA: Diagnosis present

## 2014-04-14 HISTORY — DX: Acute respiratory failure, unspecified whether with hypoxia or hypercapnia: J96.00

## 2014-04-14 LAB — RAPID URINE DRUG SCREEN, HOSP PERFORMED
Amphetamines: NOT DETECTED
Barbiturates: NOT DETECTED
Benzodiazepines: NOT DETECTED
COCAINE: NOT DETECTED
OPIATES: POSITIVE — AB
Tetrahydrocannabinol: NOT DETECTED

## 2014-04-14 LAB — POCT I-STAT 3, ART BLOOD GAS (G3+)
Acid-base deficit: 4 mmol/L — ABNORMAL HIGH (ref 0.0–2.0)
BICARBONATE: 24.1 meq/L — AB (ref 20.0–24.0)
O2 Saturation: 96 %
PCO2 ART: 55.2 mmHg — AB (ref 35.0–45.0)
PH ART: 7.249 — AB (ref 7.350–7.450)
PO2 ART: 93 mmHg (ref 80.0–100.0)
Patient temperature: 98.6
TCO2: 26 mmol/L (ref 0–100)

## 2014-04-14 LAB — COMPREHENSIVE METABOLIC PANEL
ALT: 18 U/L (ref 0–35)
AST: 41 U/L — AB (ref 0–37)
Albumin: 3.3 g/dL — ABNORMAL LOW (ref 3.5–5.2)
Alkaline Phosphatase: 26 U/L — ABNORMAL LOW (ref 39–117)
BUN: 33 mg/dL — AB (ref 6–23)
CALCIUM: 8.5 mg/dL (ref 8.4–10.5)
CO2: 26 mEq/L (ref 19–32)
Chloride: 97 mEq/L (ref 96–112)
Creatinine, Ser: 1.7 mg/dL — ABNORMAL HIGH (ref 0.50–1.10)
GFR calc Af Amer: 37 mL/min — ABNORMAL LOW (ref >90)
GFR, EST NON AFRICAN AMERICAN: 32 mL/min — AB (ref >90)
Glucose, Bld: 108 mg/dL — ABNORMAL HIGH (ref 70–99)
Potassium: 4.6 mEq/L (ref 3.7–5.3)
Sodium: 136 mEq/L — ABNORMAL LOW (ref 137–147)
Total Bilirubin: 0.3 mg/dL (ref 0.3–1.2)
Total Protein: 6.9 g/dL (ref 6.0–8.3)

## 2014-04-14 LAB — TROPONIN I: Troponin I: <0.3 ng/mL (ref <0.30)

## 2014-04-14 LAB — I-STAT ARTERIAL BLOOD GAS, ED
Acid-base deficit: 2 mmol/L (ref 0.0–2.0)
Acid-base deficit: 6 mmol/L — ABNORMAL HIGH (ref 0.0–2.0)
BICARBONATE: 28.8 meq/L — AB (ref 20.0–24.0)
BICARBONATE: 22.4 meq/L (ref 20.0–24.0)
O2 Saturation: 98 %
O2 Saturation: 77 %
PCO2 ART: 74.9 mmHg — AB (ref 35.0–45.0)
PH ART: 7.238 — AB (ref 7.350–7.450)
PO2 ART: 138 mmHg — AB (ref 80.0–100.0)
Patient temperature: 98.6
Patient temperature: 98.6
TCO2: 31 mmol/L (ref 0–100)
TCO2: 24 mmol/L (ref 0–100)
pCO2 arterial: 52.6 mmHg — ABNORMAL HIGH (ref 35.0–45.0)
pH, Arterial: 7.192 — CL (ref 7.350–7.450)
pO2, Arterial: 49 mmHg — ABNORMAL LOW (ref 80.0–100.0)

## 2014-04-14 LAB — LACTIC ACID, PLASMA: Lactic Acid, Venous: 1.7 mmol/L (ref 0.5–2.2)

## 2014-04-14 LAB — URINALYSIS, ROUTINE W REFLEX MICROSCOPIC
Bilirubin Urine: NEGATIVE
GLUCOSE, UA: NEGATIVE mg/dL
Hgb urine dipstick: NEGATIVE
KETONES UR: NEGATIVE mg/dL
Nitrite: NEGATIVE
Protein, ur: NEGATIVE mg/dL
Specific Gravity, Urine: 1.022 (ref 1.005–1.030)
UROBILINOGEN UA: 0.2 mg/dL (ref 0.0–1.0)
pH: 5 (ref 5.0–8.0)

## 2014-04-14 LAB — CBC WITH DIFFERENTIAL/PLATELET
BASOS ABS: 0 10*3/uL (ref 0.0–0.1)
BASOS PCT: 0 % (ref 0–1)
EOS PCT: 0 % (ref 0–5)
Eosinophils Absolute: 0 10*3/uL (ref 0.0–0.7)
HEMATOCRIT: 41.2 % (ref 36.0–46.0)
Hemoglobin: 13.3 g/dL (ref 12.0–15.0)
Lymphocytes Relative: 11 % — ABNORMAL LOW (ref 12–46)
Lymphs Abs: 1.2 10*3/uL (ref 0.7–4.0)
MCH: 31.2 pg (ref 26.0–34.0)
MCHC: 32.3 g/dL (ref 30.0–36.0)
MCV: 96.7 fL (ref 78.0–100.0)
MONOS PCT: 9 % (ref 3–12)
Monocytes Absolute: 1 10*3/uL (ref 0.1–1.0)
Neutro Abs: 9.2 10*3/uL — ABNORMAL HIGH (ref 1.7–7.7)
Neutrophils Relative %: 80 % — ABNORMAL HIGH (ref 43–77)
Platelets: 181 10*3/uL (ref 150–400)
RBC: 4.26 MIL/uL (ref 3.87–5.11)
RDW: 14.7 % (ref 11.5–15.5)
WBC: 11.4 10*3/uL — ABNORMAL HIGH (ref 4.0–10.5)

## 2014-04-14 LAB — SALICYLATE LEVEL

## 2014-04-14 LAB — URINE MICROSCOPIC-ADD ON

## 2014-04-14 LAB — PRO B NATRIURETIC PEPTIDE: PRO B NATRI PEPTIDE: 3283 pg/mL — AB (ref 0–125)

## 2014-04-14 LAB — GLUCOSE, CAPILLARY
GLUCOSE-CAPILLARY: 148 mg/dL — AB (ref 70–99)
Glucose-Capillary: 125 mg/dL — ABNORMAL HIGH (ref 70–99)

## 2014-04-14 LAB — MRSA PCR SCREENING: MRSA BY PCR: NEGATIVE

## 2014-04-14 LAB — ETHANOL: Alcohol, Ethyl (B): <11 mg/dL (ref 0–11)

## 2014-04-14 LAB — ACETAMINOPHEN LEVEL: Acetaminophen (Tylenol), Serum: <15 ug/mL (ref 10–30)

## 2014-04-14 LAB — I-STAT CG4 LACTIC ACID, ED: LACTIC ACID, VENOUS: 0.89 mmol/L (ref 0.5–2.2)

## 2014-04-14 MED ORDER — SODIUM CHLORIDE 0.9 % IV SOLN
Freq: Once | INTRAVENOUS | Status: AC
  Administered 2014-04-14: 13:00:00 via INTRAVENOUS

## 2014-04-14 MED ORDER — MIDAZOLAM HCL 2 MG/2ML IJ SOLN
1.0000 mg | INTRAMUSCULAR | Status: DC | PRN
  Filled 2014-04-14: qty 2

## 2014-04-14 MED ORDER — ROCURONIUM BROMIDE 50 MG/5ML IV SOLN
INTRAVENOUS | Status: AC
  Filled 2014-04-14: qty 2

## 2014-04-14 MED ORDER — ALBUTEROL SULFATE (2.5 MG/3ML) 0.083% IN NEBU
10.0000 mg | INHALATION_SOLUTION | Freq: Once | RESPIRATORY_TRACT | Status: AC
  Administered 2014-04-14: 10 mg via RESPIRATORY_TRACT
  Filled 2014-04-14: qty 12

## 2014-04-14 MED ORDER — SODIUM CHLORIDE 0.9 % IV SOLN: 1000.0000 mL | Freq: Once | INTRAVENOUS | Status: DC

## 2014-04-14 MED ORDER — BIOTENE DRY MOUTH MT LIQD
15.0000 mL | Freq: Four times a day (QID) | OROMUCOSAL | Status: DC
  Administered 2014-04-15 (×4): 15 mL via OROMUCOSAL

## 2014-04-14 MED ORDER — SODIUM CHLORIDE 0.9 % IV SOLN: 1000.0000 mL | INTRAVENOUS | Status: DC

## 2014-04-14 MED ORDER — LIDOCAINE HCL (CARDIAC) 20 MG/ML IV SOLN
INTRAVENOUS | Status: AC
  Filled 2014-04-14: qty 5

## 2014-04-14 MED ORDER — SODIUM CHLORIDE 0.9 % IV BOLUS (SEPSIS): 500.0000 mL | Freq: Once | INTRAVENOUS | Status: DC

## 2014-04-14 MED ORDER — SODIUM CHLORIDE 0.9 % IV SOLN: 250.0000 mL | INTRAVENOUS | Status: DC | PRN

## 2014-04-14 MED ORDER — HEPARIN SODIUM (PORCINE) 5000 UNIT/ML IJ SOLN
5000.0000 [IU] | Freq: Three times a day (TID) | INTRAMUSCULAR | Status: DC
  Administered 2014-04-14 – 2014-04-16 (×6): 5000 [IU] via SUBCUTANEOUS
  Filled 2014-04-14 (×8): qty 1

## 2014-04-14 MED ORDER — SODIUM CHLORIDE 0.9 % IV SOLN: INTRAVENOUS | Status: DC

## 2014-04-14 MED ORDER — ALBUTEROL SULFATE (2.5 MG/3ML) 0.083% IN NEBU: 2.5000 mg | INHALATION_SOLUTION | RESPIRATORY_TRACT | Status: DC | PRN

## 2014-04-14 MED ORDER — FENTANYL BOLUS VIA INFUSION
50.0000 ug | INTRAVENOUS | Status: DC | PRN
  Filled 2014-04-14: qty 100

## 2014-04-14 MED ORDER — SODIUM CHLORIDE 0.9 % IV SOLN: 1250.0000 mg | INTRAVENOUS | Status: DC

## 2014-04-14 MED ORDER — PIPERACILLIN-TAZOBACTAM 3.375 G IVPB 30 MIN
3.3750 g | Freq: Once | INTRAVENOUS | Status: AC
  Administered 2014-04-14: 3.375 g via INTRAVENOUS
  Filled 2014-04-14: qty 50

## 2014-04-14 MED ORDER — SODIUM CHLORIDE 0.9 % IV SOLN
0.0000 ug/h | INTRAVENOUS | Status: DC
  Administered 2014-04-14: 50 ug/h via INTRAVENOUS
  Administered 2014-04-15: 75 ug/h via INTRAVENOUS
  Administered 2014-04-15: 100 ug/h via INTRAVENOUS
  Filled 2014-04-14 (×2): qty 50

## 2014-04-14 MED ORDER — SODIUM CHLORIDE 0.9 % IV BOLUS (SEPSIS)
500.0000 mL | Freq: Once | INTRAVENOUS | Status: AC
  Administered 2014-04-14: 500 mL via INTRAVENOUS

## 2014-04-14 MED ORDER — FAMOTIDINE IN NACL 20-0.9 MG/50ML-% IV SOLN
20.0000 mg | Freq: Two times a day (BID) | INTRAVENOUS | Status: DC
  Administered 2014-04-14 – 2014-04-16 (×5): 20 mg via INTRAVENOUS
  Filled 2014-04-14 (×6): qty 50

## 2014-04-14 MED ORDER — IPRATROPIUM-ALBUTEROL 0.5-2.5 (3) MG/3ML IN SOLN
3.0000 mL | Freq: Four times a day (QID) | RESPIRATORY_TRACT | Status: DC
  Administered 2014-04-14 – 2014-04-17 (×12): 3 mL via RESPIRATORY_TRACT
  Filled 2014-04-14 (×13): qty 3

## 2014-04-14 MED ORDER — FENTANYL CITRATE 0.05 MG/ML IJ SOLN: 25.0000 ug | INTRAMUSCULAR | Status: DC | PRN

## 2014-04-14 MED ORDER — SODIUM CHLORIDE 0.9 % IV SOLN
1000.0000 mL | Freq: Once | INTRAVENOUS | Status: AC
  Administered 2014-04-14: 1000 mL via INTRAVENOUS

## 2014-04-14 MED ORDER — CHLORHEXIDINE GLUCONATE 0.12 % MT SOLN
15.0000 mL | Freq: Two times a day (BID) | OROMUCOSAL | Status: DC
  Administered 2014-04-14 – 2014-04-18 (×4): 15 mL via OROMUCOSAL
  Filled 2014-04-14 (×9): qty 15

## 2014-04-14 MED ORDER — PIPERACILLIN-TAZOBACTAM 3.375 G IVPB
3.3750 g | Freq: Three times a day (TID) | INTRAVENOUS | Status: DC
  Filled 2014-04-14 (×2): qty 50

## 2014-04-14 MED ORDER — PROPOFOL 10 MG/ML IV EMUL
INTRAVENOUS | Status: AC
  Administered 2014-04-14: 13:00:00 via INTRAVENOUS
  Filled 2014-04-14: qty 100

## 2014-04-14 MED ORDER — ETOMIDATE 2 MG/ML IV SOLN
INTRAVENOUS | Status: AC
  Administered 2014-04-14: 20 mg
  Filled 2014-04-14: qty 20

## 2014-04-14 MED ORDER — METHYLPREDNISOLONE SODIUM SUCC 125 MG IJ SOLR
125.0000 mg | Freq: Once | INTRAMUSCULAR | Status: AC
  Administered 2014-04-14: 125 mg via INTRAVENOUS
  Filled 2014-04-14: qty 2

## 2014-04-14 MED ORDER — DEXTROSE 5 % IV SOLN
1.0000 g | INTRAVENOUS | Status: AC
  Administered 2014-04-14 – 2014-04-16 (×3): 1 g via INTRAVENOUS
  Filled 2014-04-14 (×3): qty 10

## 2014-04-14 MED ORDER — FENTANYL CITRATE 0.05 MG/ML IJ SOLN
50.0000 ug | Freq: Once | INTRAMUSCULAR | Status: AC
  Administered 2014-04-14: 50 ug via INTRAVENOUS

## 2014-04-14 MED ORDER — VANCOMYCIN HCL IN DEXTROSE 1-5 GM/200ML-% IV SOLN
1000.0000 mg | Freq: Once | INTRAVENOUS | Status: AC
  Administered 2014-04-14: 1000 mg via INTRAVENOUS
  Filled 2014-04-14: qty 200

## 2014-04-14 MED ORDER — SUCCINYLCHOLINE CHLORIDE 20 MG/ML IJ SOLN
INTRAMUSCULAR | Status: AC
  Administered 2014-04-14: 125 mg
  Filled 2014-04-14: qty 1

## 2014-04-14 NOTE — H&P (Signed)
PULMONARY / CRITICAL CARE MEDICINE   Name: [name] MRN: [id number] DOB: [birth year]    ADMISSION DATE:  04/14/2014  REFERRING MD :  Dr. Jeanell Sparrow - EDP  PRIMARY SERVICE: PCCM   CHIEF COMPLAINT:  AMS / Acute Respiratory Failure   BRIEF PATIENT DESCRIPTION: 58 y/o F, smoker admitted with hypercarbic respiratory failure + UTI.    SIGNIFICANT EVENTS / STUDIES:  5/03 - admit with hypercarbic / hypoxic respiratory failure, ? Narcotic abuse per family.  Found altered at home.    LINES / TUBES: OETT 5/3 >>>  CULTURES: BCx2 5/3 >> UA 5/3 >> positive UC 5/3 >>>  ANTIBIOTICS: Rocephin 5/3 >>   HISTORY OF PRESENT ILLNESS:  58 y/o F, current smoker, with a PMH of depression / anxiety, chronic pain with concern for polypharmacy / abuse, diet controlled DM, HTN, HLD, arthritis, and COPD (not on inhalers, no oxygen) who presented to [facility] ER on 5/3 via EMS after being found altered by her family with mumbling speech, jerking movements of her arms.  On arrival to ER, patient was able to state birth date correctly but was obtunded otherwise.  She began to have desaturations in ER and was intubated per EDP.  Further work up noted the patient to have urine positive for UTI.  Initial cxr with mild pulmonary vascular congestion.  ABG reflective of hypoxemic, hypercarbic respiratory failure (acute).  PCCM called for ICU admit.  No family available at time of exam.    PAST MEDICAL HISTORY :  Past Medical History  Diagnosis Date  . Dyslipidemia   . Anxiety   . Depression   . COPD (chronic obstructive pulmonary disease)     no inhalers--smoker, no oxygen  . Shortness of breath     sometimes with exertion  . Diabetes mellitus     no meds-diet controlled  . GERD (gastroesophageal reflux disease)   . 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  no seizure  since--pt does take keppra and office note from neurologist dr. Leta Baptist on this chart  . Hypertension 04/19/11    nuclear stress test was done as part of clearance for planned knee surg in sept 2012--negative study.-no ischemia  . Restless leg syndrome   . Hypertension   . Hyperlipidemia   . Arthritis   . COPD (chronic obstructive pulmonary disease)    Past Surgical History  Procedure Laterality Date  . Laminectomy and diskectomy      L4-5 with fusion  . Knee surgery      right  . Carpal tunnel release      left  . Joint replacement  08/26/2011     right total knee arthroplasty  . Knee arthroplasty  10/28/2011    Procedure: COMPUTER ASSISTED TOTAL KNEE ARTHROPLASTY;  Surgeon: Karen Chafe Rendall III;  Location: [facility] ORS;  Service: Orthopedics;  Laterality: Left;  preop femoral nerve block  . Total knee arthroplasty     Prior to Admission medications   Medication Sig Start Date End Date Taking? Authorizing Provider  albuterol (PROVENTIL HFA;VENTOLIN HFA) 108 (90 BASE) MCG/ACT inhaler Inhale 2 puffs into the lungs every 6 (six) hours as needed. For shortness of breath/wheezing.   Yes Historical Provider, MD  amLODipine (NORVASC) 10 MG tablet Take 10 mg by mouth daily.   Yes Historical Provider, MD  Black Cohosh (REMIFEMIN)  20 MG TABS Take by mouth.   Yes Historical Provider, MD  busPIRone (BUSPAR) 30 MG tablet Take 30 mg by mouth 2 (two) times daily.   Yes Historical Provider, MD  Diphenhydramine-PE-APAP (TYLENOL ALLERGY MULTI-SYMPTOM) 25-5-325 MG TABS Take 2 capsules by mouth.   Yes Historical Provider, MD  escitalopram (LEXAPRO) 20 MG tablet Take 40 mg by mouth every morning.    Yes Historical Provider, MD  fenofibrate micronized (LOFIBRA) 134 MG capsule Take 134 mg by mouth daily before breakfast.    Yes Historical Provider, MD  furosemide (LASIX) 40 MG tablet Take 40 mg by mouth daily.   Yes Historical Provider, MD  Nutritional Supplements (ESTROVEN MOOD & MEMORY) TABS Take 1 tablet by  mouth daily.   Yes Historical Provider, MD  pantoprazole (PROTONIX) 40 MG tablet Take 40 mg by mouth every morning.  08/16/13  Yes Historical Provider, MD  pregabalin (LYRICA) 100 MG capsule Take 100 mg by mouth 2 (two) times daily.   Yes Historical Provider, MD  promethazine (PHENERGAN) 25 MG tablet Take 25 mg by mouth every 6 (six) hours as needed for nausea.  08/10/13  Yes Historical Provider, MD  ranitidine (ZANTAC) 150 MG tablet Take 150 mg by mouth 2 (two) times daily.   Yes Historical Provider, MD  rOPINIRole (REQUIP) 2 MG tablet Take 2 mg by mouth at bedtime.   Yes Historical Provider, MD  tiZANidine (ZANAFLEX) 2 MG tablet Take 2 mg by mouth at bedtime.   Yes Historical Provider, MD  traZODone (DESYREL) 50 MG tablet Take 50 mg by mouth at bedtime.   Yes Historical Provider, MD  ALPRAZolam Duanne Moron) 0.5 MG tablet Take 0.5 mg by mouth 3 (three) times daily as needed for anxiety.     Historical Provider, MD  aspirin EC 81 MG tablet Take 81 mg by mouth daily.    Historical Provider, MD  BENICAR 40 MG tablet Take 40 mg by mouth every morning.  08/29/13   Historical Provider, MD  BYSTOLIC 10 MG tablet Take 10 mg by mouth every morning.  07/26/13   Historical Provider, MD  fluconazole (DIFLUCAN) 100 MG tablet Take 100 mg by mouth daily. 04/04/14   Historical Provider, MD  oxycodone (ROXICODONE) 30 MG immediate release tablet Take 30 mg by mouth 4 (four) times daily.    Historical Provider, MD   Allergies  Allergen Reactions  . Acetaminophen     Kidney issues  . Aspirin     Dr. Michela Pitcher do not take due to kidneys  . Nsaids Other (See Comments)    Pt states it messes up her kidneys  . Vicodin [Hydrocodone-Acetaminophen] Nausea And Vomiting    FAMILY HISTORY:  Family History  Problem Relation Age of Onset  . Heart attack Father    SOCIAL HISTORY:  reports that she has been smoking Cigarettes.  She has a 40 pack-year smoking history. She does not have any smokeless tobacco history on file. She  reports that she drinks alcohol. She reports that she does not use illicit drugs.  REVIEW OF SYSTEMS:  Unable to complete as patient is altered on the vent.    SUBJECTIVE:   VITAL SIGNS: Temp:  [97.2 F (36.2 C)-98 F (36.7 C)] 97.5 F (36.4 C) (05/03 1345) Pulse Rate:  [69-76] 73 (05/03 1431) Resp:  [12-20] 16 (05/03 1431) BP: (73-117)/(31-95) 90/73 mmHg (05/03 1431) SpO2:  [86 %-100 %] 100 % (05/03 1431) FiO2 (%):  [40 %-100 %] 60 % (05/03 1431) Weight:  [185 lb (  83.915 kg)] 185 lb (83.915 kg) (05/03 1124) HEMODYNAMICS:   VENTILATOR SETTINGS: Vent Mode:  [-] PRVC FiO2 (%):  [40 %-100 %] 60 % Set Rate:  [14 bmp] 14 bmp Vt Set:  [530 mL] 530 mL PEEP:  [5 cmH20] 5 cmH20 Plateau Pressure:  [20 cmH20] 20 cmH20 INTAKE / OUTPUT: Intake/Output     05/02 0701 - 05/03 0700 05/03 0701 - 05/04 0700   I.V. (mL/kg)  1000 (11.9)   Total Intake(mL/kg)  1000 (11.9)   Net   +1000          PHYSICAL EXAMINATION: General:  Obese female on vent, no distress  Neuro:  Arouses to stimulation, reaches for ETT  HEENT:  Mm pink/moist, no jvd, OETT Cardiovascular:  s1s2 distant, regular  Lungs:  resp's even/non-labored on vent, lungs bilaterally with rhonchi Abdomen:  Obese/soft, bsx4 active  Musculoskeletal:  No acute deformities  Skin:  Warm/dry, BLE edema 2+/ chronic. Tattoos on toes.   LABS:  CBC  Recent Labs Lab 04/14/14 1146  WBC 11.4*  HGB 13.3  HCT 41.2  PLT 181   Coag's No results found for this basename: APTT, INR,  in the last 168 hours BMET  Recent Labs Lab 04/14/14 1146  NA 136*  K 4.6  CL 97  CO2 26  BUN 33*  CREATININE 1.70*  GLUCOSE 108*   Electrolytes  Recent Labs Lab 04/14/14 1146  CALCIUM 8.5   Sepsis Markers  Recent Labs Lab 04/14/14 1159  LATICACIDVEN 0.89   ABG  Recent Labs Lab 04/14/14 1209 04/14/14 1409  PHART 7.192* 7.238*  PCO2ART 74.9* 52.6*  PO2ART 138.0* 49.0*   Liver Enzymes  Recent Labs Lab 04/14/14 1146  AST 41*   ALT 18  ALKPHOS 26*  BILITOT 0.3  ALBUMIN 3.3*   Cardiac Enzymes  Recent Labs Lab 04/14/14 1146  TROPONINI <0.30  PROBNP 3283.0*   Glucose No results found for this basename: GLUCAP,  in the last 168 hours  Imaging Dg Chest Portable 1 View  04/14/2014   CLINICAL DATA:  Endotracheal tube  EXAM: PORTABLE CHEST - 1 VIEW  COMPARISON:  04/14/2014  FINDINGS: Endotracheal tube tip lies 15 mm above the chronic, well-positioned.  Pulmonary vascular congestion is stable. No other change from the earlier exam.  IMPRESSION: 1. Endotracheal tube is well positioned. No other change from the earlier study.   Electronically Signed   By: Lajean Manes M.D.   On: 04/14/2014 13:10   Dg Chest Port 1 View  04/14/2014   CLINICAL DATA:  Altered mental status  EXAM: PORTABLE CHEST - 1 VIEW  COMPARISON:  04/27/2013  FINDINGS: Mild cardiac enlargement. Lung volumes are low and there is pulmonary vascular congestion. No airspace consolidation.  IMPRESSION: 1. Pulmonary vascular congestion.   Electronically Signed   By: Kerby Moors M.D.   On: 04/14/2014 12:19    ASSESSMENT / PLAN:  PULMONARY A: Acute Hypercarbic / Hypoxemic Respiratory Failure Respiratory Acidosis COPD P:   -PRVC, 8 cc/kg, O2 60% -trend CXR -scheduled duonebs + PRN albuterol  -am ABG -daily SBT / WUA -no indication for steroids at this time  CARDIOVASCULAR A:  Hypotension - likely sedation related, lactate, wbc, absence of fever reassuring Hx HTN HLD Diastolic CHF - ECHO 123456 with EF 0000000, grade 1 diastolic dysfunction.  Elevated BNP P:  -hold home norvasc, benicar, bystolic, lofibra, lasix -consider diuresis in am 5/4 -NS at 75 ml /hr -may need to consider sedation change if remains hypotensive  RENAL  A:   Acute Kidney Injury P:   -monitor sr cr trend  -hold nephrotoxic agents   GASTROINTESTINAL A:   Protein Calorie Malnutrition P:   -NPO -SUP: pepcid -TF in am 5/4 if not able to be liberated from mechanical  vent  HEMATOLOGIC A:   No acute issues P:  -DVT: heparin sq  INFECTIOUS A:   UTI  P:   -abx as above -d/c vanco / zosyn  -follow cultures   ENDOCRINE A:   Diet Controlled DM Hyperglycemia  P:   -CBG Q4 -hold insulin coverage unless CBG consistently > 200  NEUROLOGIC A:   Chronic Pain - concern for narcotic abuse per family.  UDS positive for opiates on admit.   RLS Anxiety / Depression Hx Seizure - 2012 in setting of withdrawal of narcotics P:   -hold outpatient buspar, lexapro, lyrica, requip, zanaflex, desyrel, xanax, oxycodone  -may benefit from outpatient counseling / NA  -propofol for sedation  -PRN fentanyl for pain   Noe Gens, NP-C [facility] Pgr: [phone] or [phone]    I have personally obtained a history, examined the patient, evaluated laboratory and imaging results, formulated the assessment and plan and placed orders.  CRITICAL CARE: The patient is critically ill with multiple organ systems failure and requires high complexity decision making for assessment and support, frequent evaluation and titration of therapies, application of advanced monitoring technologies and extensive interpretation of multiple databases. Critical Care Time devoted to patient care services described in this note is __ minutes.     04/14/2014, 2:44 PM   PCCM ATTENDING: I have interviewed and examined the patient and reviewed the database. I have formulated the assessment and plan as reflected in the note above with amendments made by me. 35 mins of direct critical care time provided  Merton Border, MD;  PCCM service; Mobile [phone]

## 2014-04-14 NOTE — Progress Notes (Addendum)
ANTIBIOTIC CONSULT NOTE - INITIAL  Pharmacy Consult for vanc/zosyn Indication: rule out sepsis  Allergies  Allergen Reactions  . Acetaminophen     Kidney issues  . Aspirin     Dr. Michela Pitcher do not take due to kidneys  . Nsaids Other (See Comments)    Pt states it messes up her kidneys  . Vicodin [Hydrocodone-Acetaminophen] Nausea And Vomiting    Patient Measurements: Height: 5\' 9"  (175.3 cm) Weight: 185 lb (83.915 kg) IBW/kg (Calculated) : 66.2 Adjusted Body Weight:   Vital Signs: Temp: 98 F (36.7 C) (05/03 1124) Temp src: Rectal (05/03 1124) BP: 114/55 mmHg (05/03 1124) Pulse Rate: 72 (05/03 1124) Intake/Output from previous day:   Intake/Output from this shift:    Labs: No results found for this basename: WBC, HGB, PLT, LABCREA, CREATININE,  in the last 72 hours Estimated Creatinine Clearance: 72.5 ml/min (by C-G formula based on Cr of 0.99). No results found for this basename: VANCOTROUGH, VANCOPEAK, VANCORANDOM, GENTTROUGH, GENTPEAK, GENTRANDOM, TOBRATROUGH, TOBRAPEAK, TOBRARND, AMIKACINPEAK, AMIKACINTROU, AMIKACIN,  in the last 72 hours   Microbiology: No results found for this or any previous visit (from the past 720 hour(s)).  Medical History: Past Medical History  Diagnosis Date  . Dyslipidemia   . Anxiety   . Depression   . COPD (chronic obstructive pulmonary disease)     no inhalers--smoker, no oxygen  . Shortness of breath     sometimes with exertion  . Diabetes mellitus     no meds-diet controlled  . GERD (gastroesophageal reflux disease)   . 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  no seizure since--pt does take keppra and office note from neurologist dr. Leta Baptist on this chart  . Hypertension 04/19/11    nuclear stress test was done as part of clearance for planned knee surg in sept 2012--negative  study.-no ischemia  . Restless leg syndrome   . Hypertension   . Hyperlipidemia   . Arthritis   . COPD (chronic obstructive pulmonary disease)     Medications:   (Not in a hospital admission) Scheduled:   Assessment: 58 yo who was admitted for AMS. She was found to be febrile in the ED. Vanc/zosyn have been ordered to r/o sepsis. Lab finally came back with a scr of 1.7 crcr~42.   Goal of Therapy:  Vancomycin trough level 15-20 mcg/ml  Plan:   Vanc 1.25g IV q24 Zosyn 3.375g IV q8 F/u with trough at steady state

## 2014-04-14 NOTE — ED Notes (Signed)
Sats 88-90% on 4L [place]; placed on non-re breather per Dr. Jeanell Sparrow.

## 2014-04-14 NOTE — ED Notes (Signed)
[facility] notified of Level I

## 2014-04-14 NOTE — ED Notes (Signed)
Per EMS, several of her medicines unaccounted for and reported overuse of pain medication. They report mumbling speech, and uncontrolled movements of arms, legs, and tongue thrusting since last night. Currently patient sleeping and still; on arrival was flailing arms and legs. Stated name and birthday correctly, otherwise mumbling and drifting to sleep while answering questions and cannot understand answers.

## 2014-04-14 NOTE — ED Notes (Signed)
Lab results reported to EDP. 

## 2014-04-14 NOTE — ED Notes (Signed)
Placed in restraints per NP and MD from ICU.

## 2014-04-14 NOTE — ED Provider Notes (Signed)
CSN: [id number]     Arrival date & time 04/14/14  1117 History   First MD Initiated Contact with Patient 04/14/14 1118     Chief Complaint  Patient presents with  . Altered Mental Status   PLEASE NOTE THERE IS A NOTE IN THIS PATIENT'S CHART WHICH DOES NOT APPEAR TO BELONG TO HER.  NOTE IS ENTERED BY W TILLEY ON 07/19/13 WITH DIFFERENT NAME SPELLING AND DIFFERENT DOB [name],-  Level 5 caveat due to altered mental status and need for acute intervention (Consider location/radiation/quality/duration/timing/severity/associated sxs/prior Treatment) HPI This is a 58 year old female with multiple health problems including history of COPD, history of diabetes,  brought in today by EMS from home with reports of decreased responsiveness. They report that her husband called EMS secondary to her decreased responsiveness which had reportedly been going on for several days. He states that she takes several medicines including 30 mg oxycodone which could contribute to her decreased responsiveness. She has apparently been taking her medications schedule. EMS reports that her temperature was 101.8 axillary on their arrival. She has been able to tell them very little appearing quite confused and sleepy most of the time but with some responsiveness. They did note that her oxygen saturations were low in the mid 80s on their arrival. Past Medical History  Diagnosis Date  . Dyslipidemia   . Anxiety   . Depression   . COPD (chronic obstructive pulmonary disease)     no inhalers--smoker, no oxygen  . Shortness of breath     sometimes with exertion  . Diabetes mellitus     no meds-diet controlled  . GERD (gastroesophageal reflux disease)   . 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  no seizure since--pt does take keppra and office note from neurologist dr. Leta Baptist on  this chart  . Hypertension 04/19/11    nuclear stress test was done as part of clearance for planned knee surg in sept 2012--negative study.-no ischemia  . Restless leg syndrome   . Hypertension   . Hyperlipidemia   . Arthritis   . COPD (chronic obstructive pulmonary disease)   1  Past Surgical History  Procedure Laterality Date  . Laminectomy and diskectomy      L4-5 with fusion  . Knee surgery      right  . Carpal tunnel release      left  . Joint replacement  08/26/2011     right total knee arthroplasty  . Knee arthroplasty  10/28/2011    Procedure: COMPUTER ASSISTED TOTAL KNEE ARTHROPLASTY;  Surgeon: Karen Chafe Rendall III;  Location: [facility];  Service: Orthopedics;  Laterality: Left;  preop femoral nerve block  . Total knee arthroplasty     Family History  Problem Relation Age of Onset  . Heart attack Father    History  Substance Use Topics  . Smoking status: Current Every Day Smoker -- 1.00 packs/day for 40 years    Types: Cigarettes  . Smokeless tobacco: Not on file  . Alcohol Use: Yes     Comment: ocasionally   OB History   Grav Para Term Preterm Abortions TAB SAB Ect Mult Living                 Review of Systems  Unable to perform ROS     Allergies  Acetaminophen; Aspirin; Nsaids;  and Vicodin  Home Medications   Prior to Admission medications   Medication Sig Start Date End Date Taking? Authorizing Provider  albuterol (PROVENTIL HFA;VENTOLIN HFA) 108 (90 BASE) MCG/ACT inhaler Inhale 2 puffs into the lungs every 6 (six) hours as needed. For shortness of breath/wheezing.    Historical Provider, MD  ALPRAZolam Duanne Moron) 0.5 MG tablet Take 0.5 mg by mouth 3 (three) times daily as needed for anxiety.     Historical Provider, MD  aspirin EC 81 MG tablet Take 81 mg by mouth daily.    Historical Provider, MD  BENICAR 40 MG tablet Take 40 mg by mouth every morning.  08/29/13   Historical Provider, MD  BYSTOLIC 10 MG tablet Take 10 mg by mouth every morning.  07/26/13    Historical Provider, MD  escitalopram (LEXAPRO) 20 MG tablet Take 40 mg by mouth every morning.     Historical Provider, MD  fenofibrate micronized (LOFIBRA) 134 MG capsule Take 134 mg by mouth daily before breakfast.     Historical Provider, MD  oxycodone (ROXICODONE) 30 MG immediate release tablet Take 30 mg by mouth 4 (four) times daily.    Historical Provider, MD  pantoprazole (PROTONIX) 40 MG tablet Take 40 mg by mouth every morning.  08/16/13   Historical Provider, MD  pregabalin (LYRICA) 50 MG capsule Take 50 mg by mouth 2 (two) times daily.     Historical Provider, MD  promethazine (PHENERGAN) 25 MG tablet Take 25 mg by mouth every 6 (six) hours as needed for nausea.  08/10/13   Historical Provider, MD  rOPINIRole (REQUIP) 1 MG tablet Take 2 mg by mouth at bedtime.    Historical Provider, MD  ZOSTAVAX 76160 UNT/0.65ML injection  07/25/13   Historical Provider, MD   BP 114/55  Pulse 72  Temp(Src) 98 F (36.7 C) (Rectal)  Resp 16  Ht 5\' 9"  (1.753 m)  Wt 185 lb (83.915 kg)  BMI 27.31 kg/m2  SpO2 91% Physical Exam  Nursing note and vitals reviewed. Constitutional: She appears well-developed and well-nourished.  HENT:  Head: Normocephalic and atraumatic.  Right Ear: External ear normal.  Left Ear: External ear normal.  Mucous membranes are extremely dry  Eyes: Conjunctivae are normal.  Pupils are small at 2-3 mm but reactive to light Eye movements are wandering but appeared to move past midline in all directions she does not track on instruction.  Neck: Normal range of motion. Neck supple.  Cardiovascular: Normal rate and normal heart sounds.   Pulmonary/Chest:  Breath sounds are decreased throughout but no wheezing or rales are noted  Abdominal: Soft. Bowel sounds are normal.  Musculoskeletal: Normal range of motion. She exhibits edema.  Neurological:  Patient is arousable to tactile stimulation and verbal stimulation and will answer in one-word phrases. She appears to move all  extremities equally has not followed instructions. Deep tendon reflexes are equal throughout. Toes are down going bilaterally.  Skin: Skin is warm. Rash noted.  Few scattered petechiae over dorsum of left foot    ED Course  Procedures (including critical care time) Labs Review Labs Reviewed  CBC WITH DIFFERENTIAL - Abnormal; Notable for the following:    WBC 11.4 (*)    Neutrophils Relative % 80 (*)    Neutro Abs 9.2 (*)    Lymphocytes Relative 11 (*)    All other components within normal limits  COMPREHENSIVE METABOLIC PANEL - Abnormal; Notable for the following:    Sodium 136 (*)    Glucose, Bld 108 (*)  BUN 33 (*)    Creatinine, Ser 1.70 (*)    Albumin 3.3 (*)    AST 41 (*)    Alkaline Phosphatase 26 (*)    GFR calc non Af Amer 32 (*)    GFR calc Af Amer 37 (*)    All other components within normal limits  URINALYSIS, ROUTINE W REFLEX MICROSCOPIC - Abnormal; Notable for the following:    APPearance CLOUDY (*)    Leukocytes, UA MODERATE (*)    All other components within normal limits  PRO B NATRIURETIC PEPTIDE - Abnormal; Notable for the following:    Pro B Natriuretic peptide (BNP) 3283.0 (*)    All other components within normal limits  URINE RAPID DRUG SCREEN (HOSP PERFORMED) - Abnormal; Notable for the following:    Opiates POSITIVE (*)    All other components within normal limits  SALICYLATE LEVEL - Abnormal; Notable for the following:    Salicylate Lvl <6.1 (*)    All other components within normal limits  URINE MICROSCOPIC-ADD ON - Abnormal; Notable for the following:    Squamous Epithelial / LPF MANY (*)    Bacteria, UA FEW (*)    All other components within normal limits  I-STAT ARTERIAL BLOOD GAS, ED - Abnormal; Notable for the following:    pH, Arterial 7.192 (*)    pCO2 arterial 74.9 (*)    pO2, Arterial 138.0 (*)    Bicarbonate 28.8 (*)    All other components within normal limits  CULTURE, BLOOD (ROUTINE X 2)  CULTURE, BLOOD (ROUTINE X 2)  URINE  CULTURE  TROPONIN I  ETHANOL  ACETAMINOPHEN LEVEL  I-STAT CG4 LACTIC ACID, ED    Imaging Review Dg Chest Port 1 View  04/14/2014   CLINICAL DATA:  Altered mental status  EXAM: PORTABLE CHEST - 1 VIEW  COMPARISON:  04/27/2013  FINDINGS: Mild cardiac enlargement. Lung volumes are low and there is pulmonary vascular congestion. No airspace consolidation.  IMPRESSION: 1. Pulmonary vascular congestion.   Electronically Signed   By: Kerby Moors M.D.   On: 04/14/2014 12:19     EKG Interpretation None      MDM   Final diagnoses:  None    12:34 PM Patient becoming more obtunded and less responsive. ABG obtained with pH of 7.19 PCO2 of 74 PO2 of 138 this is on 2 L by nasal cannula. Chest x-Carizma Dunsworth shows vascular congestion versus bilateral infiltrates.  Given patient's poor mentation with decreased ability to maintain airway, I have opted to intubate patient versus BiPAP. INTUBATION Performed by: Shaune Pollack  Required items: required blood products, implants, devices, and special equipment available Patient identity confirmed: provided demographic data and hospital-assigned identification number Time out: Immediately prior to procedure a "time out" was called to verify the correct patient, procedure, equipment, support staff and site/side marked as required.  Indications: see above  Intubation method: Glidescope Laryngoscopy   Preoxygenation: Preoxygenated with high flow nasal cannula and nonrebreather mask.   Sedatives:  20 Etomidate Paralytic: 125 Succinylcholine  Tube Size: 8.0 cuffed  Post-procedure assessment: chest rise and ETCO2 monitor Breath sounds: equal and absent over the epigastrium Tube secured with: ETT holder Chest x-Yoneko Talerico interpreted by radiologist and me.  Chest x-Jamee Pacholski findings: endotracheal tube in appropriate position  Patient tolerated the procedure well with no immediate complications.  Patient with good color change on CO2 monitor, bilateral breath  sounds, and fogging of ET tube. Chest x-Zuhayr Deeney is currently pending.  12:37 PM  Patient has  had some intermittent hypotension with a one blood pressure reading of 70/42 but repeated that showed 95/50. She is receiving 2 L of normal saline pressure bag. She has received Zosyn and vancomycin. Plan 10 mg albuterol in-line Atrovent over next hour. In addition I will add 145 mg of Solu-Medrol.   We are still waiting initial round of laboratory testing. I continued to be concerned for sepsis versus primary respiratory failure.  It is also possible that this is due to form of overdose as is reported that she has been taking more of her medicines than usual.  12:44 PM  Discussed with Dr. Alva Garnet. 1:05 PM Patient's husband and mother have arrived. Husband states that she became nauseated and vomited several times last night at approximately 11. She then fell asleep and this morning continued to complain of nausea. She became less responsive and he called EMS. He states that she does take 30 mg of oxycodone 4 times per day and has been known to over take them in the past. He states that she has complained of some difficulty breathing. He was unable to give any more information regarding this current illness. I discussed the patient's status and care with the family members. They voice understanding that she'll be admitted to the ICU.  Laboratory workup consistent with congestive heart failure. However, pressure remains borderline hypotensive. Slowed down fluids to 100 per hour. She is maintaining good urine output. She has been maintained on a propofol drip for sedation.  CRITICAL CARE Performed by: Shaune Pollack Total critical care time: 1 Critical care time was exclusive of separately billable procedures and treating other patients. Critical care was necessary to treat or prevent imminent or life-threatening deterioration. Critical care was time spent personally by me on the following activities:  development of treatment plan with patient and/or surrogate as well as nursing, discussions with consultants, evaluation of patient's response to treatment, examination of patient, obtaining history from patient or surrogate, ordering and performing treatments and interventions, ordering and review of laboratory studies, ordering and review of radiographic studies, pulse oximetry and re-evaluation of patient's condition.   Shaune Pollack, MD 04/14/14 2017

## 2014-04-15 ENCOUNTER — Inpatient Hospital Stay (HOSPITAL_COMMUNITY): Payer: PRIVATE HEALTH INSURANCE

## 2014-04-15 DIAGNOSIS — G40209 Localization-related (focal) (partial) symptomatic epilepsy and epileptic syndromes with complex partial seizures, not intractable, without status epilepticus: Secondary | ICD-10-CM | POA: Diagnosis present

## 2014-04-15 DIAGNOSIS — I10 Essential (primary) hypertension: Secondary | ICD-10-CM

## 2014-04-15 DIAGNOSIS — R569 Unspecified convulsions: Secondary | ICD-10-CM

## 2014-04-15 DIAGNOSIS — G40109 Localization-related (focal) (partial) symptomatic epilepsy and epileptic syndromes with simple partial seizures, not intractable, without status epilepticus: Secondary | ICD-10-CM

## 2014-04-15 LAB — BLOOD GAS, ARTERIAL
ACID-BASE DEFICIT: 1.8 mmol/L (ref 0.0–2.0)
Bicarbonate: 23.6 mEq/L (ref 20.0–24.0)
Drawn by: 24513
FIO2: 0.4 %
MECHVT: 500 mL
O2 Saturation: 96.1 %
PEEP: 5 cmH2O
PO2 ART: 88.8 mmHg (ref 80.0–100.0)
Patient temperature: 99
RATE: 16 resp/min
TCO2: 25.1 mmol/L (ref 0–100)
pCO2 arterial: 49.2 mmHg — ABNORMAL HIGH (ref 35.0–45.0)
pH, Arterial: 7.303 — ABNORMAL LOW (ref 7.350–7.450)

## 2014-04-15 LAB — BASIC METABOLIC PANEL
BUN: 21 mg/dL (ref 6–23)
CHLORIDE: 106 meq/L (ref 96–112)
CO2: 23 mEq/L (ref 19–32)
Calcium: 8.5 mg/dL (ref 8.4–10.5)
Creatinine, Ser: 0.91 mg/dL (ref 0.50–1.10)
GFR calc non Af Amer: 69 mL/min — ABNORMAL LOW (ref >90)
GFR, EST AFRICAN AMERICAN: 80 mL/min — AB (ref >90)
Glucose, Bld: 155 mg/dL — ABNORMAL HIGH (ref 70–99)
POTASSIUM: 4.3 meq/L (ref 3.7–5.3)
Sodium: 140 mEq/L (ref 137–147)

## 2014-04-15 LAB — CBC
HEMATOCRIT: 39.7 % (ref 36.0–46.0)
HEMOGLOBIN: 12.8 g/dL (ref 12.0–15.0)
MCH: 30.9 pg (ref 26.0–34.0)
MCHC: 32.2 g/dL (ref 30.0–36.0)
MCV: 95.9 fL (ref 78.0–100.0)
Platelets: 162 10*3/uL (ref 150–400)
RBC: 4.14 MIL/uL (ref 3.87–5.11)
RDW: 14.7 % (ref 11.5–15.5)
WBC: 9.5 10*3/uL (ref 4.0–10.5)

## 2014-04-15 LAB — GLUCOSE, CAPILLARY
GLUCOSE-CAPILLARY: 108 mg/dL — AB (ref 70–99)
GLUCOSE-CAPILLARY: 112 mg/dL — AB (ref 70–99)
GLUCOSE-CAPILLARY: 91 mg/dL (ref 70–99)
Glucose-Capillary: 101 mg/dL — ABNORMAL HIGH (ref 70–99)
Glucose-Capillary: 146 mg/dL — ABNORMAL HIGH (ref 70–99)
Glucose-Capillary: 154 mg/dL — ABNORMAL HIGH (ref 70–99)
Glucose-Capillary: 156 mg/dL — ABNORMAL HIGH (ref 70–99)

## 2014-04-15 LAB — MAGNESIUM: Magnesium: 1.8 mg/dL (ref 1.5–2.5)

## 2014-04-15 LAB — PHOSPHORUS: Phosphorus: 2.9 mg/dL (ref 2.3–4.6)

## 2014-04-15 MED ORDER — OXYCODONE HCL 5 MG PO TABS
5.0000 mg | ORAL_TABLET | Freq: Four times a day (QID) | ORAL | Status: DC | PRN
  Administered 2014-04-15 – 2014-04-18 (×9): 5 mg via ORAL
  Filled 2014-04-15 (×10): qty 1

## 2014-04-15 MED ORDER — MORPHINE SULFATE 2 MG/ML IJ SOLN
1.0000 mg | INTRAMUSCULAR | Status: DC | PRN
  Administered 2014-04-15 – 2014-04-16 (×3): 2 mg via INTRAVENOUS
  Filled 2014-04-15 (×3): qty 1

## 2014-04-15 MED ORDER — FENTANYL CITRATE 0.05 MG/ML IJ SOLN
12.5000 ug | INTRAMUSCULAR | Status: DC | PRN
  Administered 2014-04-15: 12.5 ug via INTRAVENOUS
  Filled 2014-04-15: qty 2

## 2014-04-15 MED ORDER — DEXTROSE 5 % IV SOLN
INTRAVENOUS | Status: DC
  Administered 2014-04-15: 12:00:00 via INTRAVENOUS

## 2014-04-15 MED ORDER — LEVETIRACETAM 500 MG/5ML IV SOLN
1000.0000 mg | INTRAVENOUS | Status: AC
  Administered 2014-04-15: 1000 mg via INTRAVENOUS
  Filled 2014-04-15: qty 10

## 2014-04-15 MED ORDER — SODIUM CHLORIDE 0.9 % IV SOLN
500.0000 mg | Freq: Two times a day (BID) | INTRAVENOUS | Status: DC
  Administered 2014-04-15 – 2014-04-16 (×2): 500 mg via INTRAVENOUS
  Filled 2014-04-15 (×3): qty 5

## 2014-04-15 NOTE — Procedures (Signed)
Extubation Procedure Note  Patient Details:   Name: AVE SCHARNHORST DOB: [birth year] MRN: [id number]   Airway Documentation:  Airway 8 mm (Active)  Secured at (cm) 20 cm 04/14/2014 12:46 PM  Measured From Lips 04/14/2014 12:46 PM  Secured Location Right 04/14/2014 12:46 PM  Secured By [employer] 04/14/2014 12:46 PM    Evaluation  O2 sats: stable throughout Complications: No apparent complications Patient did tolerate procedure well. Bilateral Breath Sounds: Clear Suctioning: Oral;Airway Yes PT was extubated to a 5l N/C. PT rr rate and heart rate was stable before, during and after extubation. PT was able to speak.   Leonie Douglas Edahi Kroening 04/15/2014, 12:15 PM

## 2014-04-15 NOTE — Progress Notes (Signed)
PULMONARY / CRITICAL CARE MEDICINE   Name: [name] MRN: [id number] DOB: 05/01/1956    ADMISSION DATE:  04/14/2014  REFERRING MD :  Dr. Jeanell Sparrow - EDP  PRIMARY SERVICE: PCCM   CHIEF COMPLAINT:  AMS / Acute Respiratory Failure   BRIEF PATIENT DESCRIPTION: 58 y/o F, smoker admitted with hypercarbic respiratory failure + UTI.    SIGNIFICANT EVENTS / STUDIES:  5/03 - admit with hypercarbic/hypoxic respiratory failure, Concern for over use of narcotic meds per family. Intubated in ED for AMS and hypercarbia. Cr 1.7 (baseline 0.99). Possible UTI 5/04 Passed SBT. Intermittent brief spells of apnea and unresponsiveness. Review of records reveal prior Rx for Keppra. Pt was not taking this med at time of admission. Records obtained from [facility] reveal prior hx of temporal lobe seizures. Loaded with Keppra and maintenance dose initiated  LINES / TUBES: ETT 5/3 >> 5/04  CULTURES: Urine 5/3 >> 11-20 WBC/hpf, many bacteria >>  Blood 5/03 >>   ANTIBIOTICS: Ceftrx 5/3 >>    SUBJECTIVE:  RASS 0 with brief spells of diminished responsiveness and apnea. Passed SBT  VITAL SIGNS: Temp:  [97.5 F (36.4 C)-99.8 F (37.7 C)] 98.8 F (37.1 C) (05/04 1115) Pulse Rate:  [65-92] 83 (05/04 1115) Resp:  [13-22] 13 (05/04 1115) BP: (84-133)/(31-73) 132/66 mmHg (05/04 1115) SpO2:  [90 %-100 %] 96 % (05/04 1115) FiO2 (%):  [40 %-60 %] 40 % (05/04 1200) Weight:  [94 kg (207 lb 3.7 oz)-94.8 kg (208 lb 15.9 oz)] 94.8 kg (208 lb 15.9 oz) (05/04 0500) HEMODYNAMICS:   VENTILATOR SETTINGS: Vent Mode:  [-] PSV;CPAP FiO2 (%):  [40 %-60 %] 40 % Set Rate:  [16 bmp] 16 bmp Vt Set:  [500 mL] 500 mL PEEP:  [5 cmH20] 5 cmH20 Pressure Support:  [5 cmH20-10 cmH20] 5 cmH20 Plateau Pressure:  [15 cmH20-17 cmH20] 16 cmH20 INTAKE / OUTPUT: Intake/Output     05/03 0701 - 05/04 0700 05/04 0701 - 05/05 0700   I.V. (mL/kg) 2484.1 (26.2) 250 (2.6)   IV Piggyback 150 160   Total Intake(mL/kg) 2634.1 (27.8)  410 (4.3)   Urine (mL/kg/hr) 1260 380 (0.6)   Total Output 1260 380   Net +1374.1 +30          PHYSICAL EXAMINATION: General:  RASS 0. NAD. + F/C  Neuro: No focal deficits  HEENT:  EOMI, PERRL Cardiovascular:  RRR s M  Lungs: no wheezes Abdomen:  Obese/soft, bsx4 active  Ext: trace symmetric edema .   LABS: I have reviewed all of today's lab results. Relevant abnormalities are discussed in the A/P section  CXR: NACPD  ASSESSMENT / PLAN:  PULMONARY A: Acute Respiratory Failure Respiratory Acidosis Smoker P:   Extubate Supp O2 Monitor in ICU post extubation  CARDIOVASCULAR A:  Hypotension, resolved Hx HTN P:  Monitor rhythm and BP Holding home meds  RENAL A:   Acute Kidney Injury, resolved P:   Monitor BMET intermittently Monitor I/Os Correct electrolytes as indicated  GASTROINTESTINAL A:   Chronic PPI use (? Compliance) P:   SUP: IV famotidine NPO post extubation  HEMATOLOGIC A:   No acute issues P:  DVT px: SQ hep Monitor CBC intermittently Transfuse per usual ICU guidelines  INFECTIOUS A:   Pyuria, possible UTI P:   -abx as above -d/c vanco / zosyn  -follow cultures   ENDOCRINE A:   Diet Controlled DM Mild hyperglycemia  P:   Cont to monitor CBGs SSI for glu > 180  NEUROLOGIC  A:   Chronic Pain Anxiety / Depression Hx Seizure P:   Holding outpatient buspar, lexapro, lyrica, requip, zanaflex, desyrel, xanax, oxycodone  Low dose PRN fentanyl  Keppra load and maintenance ordered    I have personally obtained a history, examined the patient, evaluated laboratory and imaging results, formulated the assessment and plan and placed orders.  CRITICAL CARE: The patient is critically ill with multiple organ systems failure and requires high complexity decision making for assessment and support, frequent evaluation and titration of therapies, application of advanced monitoring technologies and extensive interpretation of multiple  databases. Critical Care Time devoted to patient care services described in this note is 45 minutes.    Merton Border, MD ; [facility] [phone].  After 5:30 PM or weekends, call [phone]  04/15/2014, 1:42 PM   PCCM ATTENDING: I have interviewed and examined the patient and reviewed the database. I have formulated the assessment and plan as reflected in the note above with amendments made by me. 35 mins of direct critical care time provided  Merton Border, MD;  PCCM service; Mobile [phone]

## 2014-04-15 NOTE — Progress Notes (Signed)
Noted during the 1600 assessment that the pt was lethargic. Would not sustain eye contact greater than 10 seconds. Did not feel that it was safe to feed or ambulate the pt. Will continue to monitor

## 2014-04-15 NOTE — Progress Notes (Signed)
INITIAL NUTRITION ASSESSMENT  DOCUMENTATION CODES Per approved criteria  -Obesity Unspecified   INTERVENTION: Diet advancement per MD  RD to continue to follow nutrition care plan  NUTRITION DIAGNOSIS: Inadequate oral intake related to inability to eat as evidenced by NPO status.   Goal: Meet >/=90% of estimated nutrition needs   Monitor:  PO diet advancement, weight trend, labs  Reason for Assessment: Positive Malnutrition Screening Tool Score   58 y.o. female  Admitting Dx: Acute Respiratory Failure   ASSESSMENT: 58 y/o F, smoker admitted with hypercarbic respiratory failure + UTI. PMHx significant for depression / anxiety, chronic pain with concern for polypharmacy / abuse, diet controlled DM, HTN, HLD, arthritis, and COPD (not on inhalers, no oxygen) who presented to [facility] ER on 5/3 via EMS after being found altered by her family with mumbling speech, jerking movements of her arms.  Patient intubated on 5/3 and extubated 5/4.   Patient very sleepy during assessment and unable to talk. Chart records show weight gain of 11% since May 2014. Bed weight during assessment read 209 lbs. Pt has excess fluid in lower extremities and this could be contributing to the weight gain.  Pt reported no wt loss and good appetite at home. Could not obtain diet history from pt as pt could not speak well and no family present to provide history.   Patient is currently NPO with ice chips. If tolerating, MD noted to remove NGT and advance diet.  Nutrition Focused Physical Exam:  Subcutaneous Fat:  Orbital Region: wnl Upper Arm Region: wnl Thoracic and Lumbar Region: wnl  Muscle:  Temple Region: wnl Clavicle Bone Region: wnl Clavicle and Acromion Bone Region: wnl Scapular Bone Region: wnl Dorsal Hand: wnl Patellar Region: wnl Anterior Thigh Region: wnl Posterior Calf Region: wnl  Edema: +2 Generalized, +1 RLE, +1 LLE   Height: Ht Readings from Last 1 Encounters:  04/14/14 5\' 7"   (1.702 m)    Weight: Wt Readings from Last 1 Encounters:  04/15/14 208 lb 15.9 oz (94.8 kg)    Ideal Body Weight: 61.4 kg   % Ideal Body Weight: 154%   Wt Readings from Last 10 Encounters:  04/15/14 208 lb 15.9 oz (94.8 kg)  04/30/13 187 lb 6.3 oz (85 kg)  01/13/13 169 lb 15.6 oz (77.1 kg)  10/31/11 173 lb (78.472 kg)  10/31/11 173 lb (78.472 kg)  10/26/11 176 lb 6.4 oz (80.015 kg)  04/19/11 195 lb (88.451 kg)  03/29/11 196 lb 9.6 oz (89.177 kg)    Usual Body Weight: 170-180 lbs   % Usual Body Weight: 115%   BMI:  Body mass index is 32.73 kg/(m^2). Obesity Class I  Estimated Nutritional Needs: Kcal: 1800-2000 Protein: 95-110 g  Fluid: >/=1.9 L/day   Skin: WDL  Diet Order: NPO   EDUCATION NEEDS: -Education not appropriate at this time   Intake/Output Summary (Last 24 hours) at 04/15/14 0903 Last data filed at 04/15/14 0800  Gross per 24 hour  Intake 2719.1 ml  Output   1360 ml  Net 1359.1 ml    Last BM: PTA   Labs:   Recent Labs Lab 04/14/14 1146 04/15/14 0309  NA 136* 140  K 4.6 4.3  CL 97 106  CO2 26 23  BUN 33* 21  CREATININE 1.70* 0.91  CALCIUM 8.5 8.5  MG  --  1.8  PHOS  --  2.9  GLUCOSE 108* 155*    CBG (last 3)   Recent Labs  04/14/14 2345 04/15/14 0355 04/15/14  0758  GLUCAP 154* 156* 146*    Scheduled Meds: . antiseptic oral rinse  15 mL Mouth Rinse QID  . cefTRIAXone (ROCEPHIN)  IV  1 g Intravenous Q24H  . chlorhexidine  15 mL Mouth Rinse BID  . famotidine (PEPCID) IV  20 mg Intravenous Q12H  . heparin  5,000 Units Subcutaneous 3 times per day  . ipratropium-albuterol  3 mL Nebulization Q6H    Continuous Infusions: . sodium chloride 75 mL/hr at 04/14/14 1445  . fentaNYL infusion INTRAVENOUS 75 mcg/hr (04/15/14 8786)    Past Medical History  Diagnosis Date  . Dyslipidemia   . Anxiety   . Depression   . COPD (chronic obstructive pulmonary disease)     no inhalers--smoker, no oxygen  . Shortness of breath      sometimes with exertion  . Diabetes mellitus     no meds-diet controlled  . GERD (gastroesophageal reflux disease)   . 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  no seizure since--pt does take keppra and office note from neurologist dr. Leta Baptist on this chart  . Hypertension 04/19/11    nuclear stress test was done as part of clearance for planned knee surg in sept 2012--negative study.-no ischemia  . Restless leg syndrome   . Hypertension   . Hyperlipidemia   . Arthritis   . COPD (chronic obstructive pulmonary disease)     Past Surgical History  Procedure Laterality Date  . Laminectomy and diskectomy      L4-5 with fusion  . Knee surgery      right  . Carpal tunnel release      left  . Joint replacement  08/26/2011     right total knee arthroplasty  . Knee arthroplasty  10/28/2011    Procedure: COMPUTER ASSISTED TOTAL KNEE ARTHROPLASTY;  Surgeon: Karen Chafe Rendall III;  Location: [facility];  Service: Orthopedics;  Laterality: Left;  preop femoral nerve block  . Total knee arthroplasty      Carrolyn Leigh, BS Dietetic Intern Pager: [phone]  Intern note/chart reviewed. Revisions made.  [place], [place], [place] Pager [phone] After Hours Pager

## 2014-04-15 NOTE — Care Management Note (Signed)
    Page 1 of 1   04/15/2014     11:22:01 AM CARE MANAGEMENT NOTE 04/15/2014  Patient:  [name], [name]   Account Number:  [id number]  Date Initiated:  04/15/2014  Documentation initiated by:  Elissa Hefty  Subjective/Objective Assessment:   adm w resp failure, vent     Action/Plan:   lives w husband, pcp dr Timoteo Ace hamrick   Anticipated DC Date:     Anticipated DC Plan:           Choice offered to / List presented to:             Status of service:   Medicare Important Message given?   (If response is "NO", the following Medicare IM given date fields will be blank) Date Medicare IM given:   Date Additional Medicare IM given:    Discharge Disposition:    Per UR Regulation:  Reviewed for med. necessity/level of care/duration of stay  If discussed at [place] of Stay Meetings, dates discussed:    Comments:

## 2014-04-15 NOTE — Progress Notes (Signed)
200 cc of fentanyl drip wasted in sink.  Fentanyl drip d/c'd due to extubation.  Waste witnessed by Marisue Brooklyn, RN.

## 2014-04-15 NOTE — Progress Notes (Signed)
eLink Physician-Brief Progress Note Patient Name: [name] DOB: Nov 30, 1956 MRN: [id number]  Date of Service  04/15/2014   HPI/Events of Note   Pt & family requesting different pain med from fentnayl Extubated today  eICU Interventions  Use Oxycodone 5 mg q 6hprn Morphine prn (longer acting)   Intervention Category Intermediate Interventions: Pain - evaluation and management  Rigoberto Noel 04/15/2014, 6:39 PM

## 2014-04-16 LAB — URINE CULTURE: Colony Count: >=100000

## 2014-04-16 LAB — GLUCOSE, CAPILLARY
Glucose-Capillary: 88 mg/dL (ref 70–99)
Glucose-Capillary: 83 mg/dL (ref 70–99)
Glucose-Capillary: 82 mg/dL (ref 70–99)
Glucose-Capillary: 88 mg/dL (ref 70–99)
Glucose-Capillary: 83 mg/dL (ref 70–99)

## 2014-04-16 MED ORDER — TRAZODONE HCL 50 MG PO TABS
50.0000 mg | ORAL_TABLET | Freq: Every day | ORAL | Status: DC
  Administered 2014-04-16 – 2014-04-17 (×2): 50 mg via ORAL
  Filled 2014-04-16 (×3): qty 1

## 2014-04-16 MED ORDER — ENOXAPARIN SODIUM 40 MG/0.4ML ~~LOC~~ SOLN
40.0000 mg | Freq: Every day | SUBCUTANEOUS | Status: DC
  Administered 2014-04-16 – 2014-04-18 (×3): 40 mg via SUBCUTANEOUS
  Filled 2014-04-16 (×3): qty 0.4

## 2014-04-16 MED ORDER — LEVETIRACETAM 500 MG PO TABS
500.0000 mg | ORAL_TABLET | Freq: Two times a day (BID) | ORAL | Status: DC
Start: 2014-04-16 — End: 2014-04-18
  Administered 2014-04-16 – 2014-04-18 (×4): 500 mg via ORAL
  Filled 2014-04-16 (×6): qty 1

## 2014-04-16 MED ORDER — PANTOPRAZOLE SODIUM 40 MG PO TBEC
40.0000 mg | DELAYED_RELEASE_TABLET | Freq: Every morning | ORAL | Status: DC
  Administered 2014-04-17 – 2014-04-18 (×2): 40 mg via ORAL
  Filled 2014-04-16 (×2): qty 1

## 2014-04-16 MED ORDER — BUSPIRONE HCL 15 MG PO TABS
30.0000 mg | ORAL_TABLET | Freq: Two times a day (BID) | ORAL | Status: DC
  Administered 2014-04-16 – 2014-04-18 (×5): 30 mg via ORAL
  Filled 2014-04-16 (×6): qty 2

## 2014-04-16 MED ORDER — BUSPIRONE HCL 15 MG PO TABS: 30.0000 mg | ORAL_TABLET | Freq: Two times a day (BID) | ORAL | Status: DC

## 2014-04-16 MED ORDER — MIRTAZAPINE 15 MG PO TABS
15.0000 mg | ORAL_TABLET | Freq: Every day | ORAL | Status: DC
  Administered 2014-04-16 – 2014-04-17 (×2): 15 mg via ORAL
  Filled 2014-04-16 (×4): qty 1

## 2014-04-16 NOTE — Progress Notes (Signed)
PULMONARY / CRITICAL CARE MEDICINE   Name: [name] MRN: [id number] DOB: 1956-12-10    ADMISSION DATE:  04/14/2014  REFERRING MD :  Dr. Jeanell Sparrow - EDP  PRIMARY SERVICE: PCCM   CHIEF COMPLAINT:  AMS / Acute Respiratory Failure   BRIEF PATIENT DESCRIPTION: 58 y/o F, smoker admitted with hypercarbic respiratory failure + UTI.    SIGNIFICANT EVENTS / STUDIES:  5/03 - admit with hypercarbic/hypoxic respiratory failure, Concern for over use of narcotic meds per family. Intubated in ED for AMS and hypercarbia. Cr 1.7 (baseline 0.99). Possible UTI 5/04 Passed SBT. Intermittent brief spells of apnea and unresponsiveness. Review of records reveal prior Rx for Keppra. Pt was not taking this med at time of admission. Records obtained from [facility] reveal prior hx of temporal lobe seizures. Loaded with Keppra and maintenance dose initiated  LINES / TUBES: ETT 5/3 >> 5/04  CULTURES: Urine 5/3 >> E coli Blood 5/03 >>   ANTIBIOTICS: Ceftrx 5/3 >> 5/05   SUBJECTIVE:  Has tolerated extubation without difficulty. Occasional inappropriate behavior with yelling out for nurse. Oriented to person place and time. Understands circumstance when explained to her  VITAL SIGNS: Temp:  [96.9 F (36.1 C)-100 F (37.8 C)] 98 F (36.7 C) (05/05 1526) Pulse Rate:  [58-84] 64 (05/05 1500) Resp:  [12-21] 16 (05/05 1500) BP: (119-164)/(60-100) 164/87 mmHg (05/05 1500) SpO2:  [88 %-98 %] 97 % (05/05 1526) Weight:  [90.6 kg (199 lb 11.8 oz)] 90.6 kg (199 lb 11.8 oz) (05/05 0500) HEMODYNAMICS:   VENTILATOR SETTINGS:   INTAKE / OUTPUT: Intake/Output     05/04 0701 - 05/05 0700 05/05 0701 - 05/06 0700   P.O.  200   I.V. (mL/kg) 615.5 (6.8) 140 (1.5)   IV Piggyback 365 205   Total Intake(mL/kg) 980.5 (10.8) 545 (6)   Urine (mL/kg/hr) 2450 (1.1) 1850 (2.1)   Total Output 2450 1850   Net -1469.5 -1305          PHYSICAL EXAMINATION: General:  RASS +1. No respiratory distress. + F/C  Neuro: No  focal deficits  HEENT:  EOMI, PERRL Cardiovascular:  RRR s M  Lungs: no wheezes Abdomen:  Obese/soft, bsx4 active  Ext: trace symmetric edema .   LABS: I have reviewed all of today's lab results. Relevant abnormalities are discussed in the A/P section  CXR: NACPD  ASSESSMENT / PLAN:  PULMONARY A: Acute Respiratory Failure, resolved intubated for obtundation with hypercarbia Respiratory Acidosis, resolved Smoker P:   Extubate Cont supp O2 to maintain SpO2 > 92% Cont nebulized BDs  NEUROLOGIC A:   Temporal lobe epilepsy - see recordsd from [facility] in paper chart Noncompliance with Keppra - "I didn't think I needed it" Obtundation, suspect due to temporal lobe seizures and possible overuse of opioids Chronic Pain Anxiety / Depression P:   Loaded with Keppra 5/04 Change Keppra to PO  CARDIOVASCULAR A:  Hypotension, resolved Hx HTN - controlled P:  Holding home meds  RENAL A:   Acute Kidney Injury, resolved P:   Monitor BMET intermittently Monitor I/Os Correct electrolytes as indicated  GASTROINTESTINAL A:   Chronic PPI use  P:   Begin diet Cont PO PPI  HEMATOLOGIC A:   No acute issues P:  DVT px: enoxaparin Monitor CBC intermittently Transfuse per usual guidelines  INFECTIOUS A:   Pyuria, possible UTI P:   Complete 3 doses of ceftrx - finishes today  ENDOCRINE A:   Diet Controlled DM Mild hyperglycemia, resolved P:  Cont to monitor CBGs Consider SSI for glu > 180    Transfer to med surg floor with sitter. TRH to assume care as of AM 5/06 and PCCM to sign off @ that time. Discussed with Dr Marton Redwood, MD ; [facility] [phone].  After 5:30 PM or weekends, call [phone]  04/16/2014, 4:58 PM

## 2014-04-16 NOTE — Progress Notes (Signed)
Pt arrived to 6e20 @ 1715. Mother and spouse present at bedside. C/o pain 7/10. BP 175/88, HR 64. Continue to monitor.

## 2014-04-17 DIAGNOSIS — T50901A Poisoning by unspecified drugs, medicaments and biological substances, accidental (unintentional), initial encounter: Secondary | ICD-10-CM

## 2014-04-17 MED ORDER — IPRATROPIUM-ALBUTEROL 0.5-2.5 (3) MG/3ML IN SOLN: 3.0000 mL | Freq: Four times a day (QID) | RESPIRATORY_TRACT | Status: DC | PRN

## 2014-04-17 MED ORDER — BOOST / RESOURCE BREEZE PO LIQD: 1.0000 | ORAL | Status: DC | PRN

## 2014-04-17 MED ORDER — ROPINIROLE HCL 1 MG PO TABS
1.0000 mg | ORAL_TABLET | Freq: Every day | ORAL | Status: DC
  Administered 2014-04-17: 1 mg via ORAL
  Filled 2014-04-17 (×2): qty 1

## 2014-04-17 MED ORDER — ADULT MULTIVITAMIN W/MINERALS CH
1.0000 | ORAL_TABLET | Freq: Every day | ORAL | Status: DC
Start: 2014-04-17 — End: 2014-04-18
  Administered 2014-04-17 – 2014-04-18 (×2): 1 via ORAL
  Filled 2014-04-17 (×2): qty 1

## 2014-04-17 NOTE — Progress Notes (Signed)
[facility] PROGRESS NOTE  TAWNEY VANORMAN VOZ:[id number] DOB: [name] 14, 1956 DOA: 04/14/2014 PCP: Leonides Sake, MD  Assessment/Plan: 1. Acute hypoxemic hypercarbic respiratory failure -Suspect secondary to narcotic overdose. Patient had reported taking approximately 15 mg of oxycodone 4 times daily, although she denies taking extra doses. -She was extubated on 04/15/2014. -Remains stable, although she did desat into the mid 80s on ambulation today -Incentive spirometry  2.  Temporal lobe epilepsy. -Patient had been noncompliant with Keppra, which was restarted during this hospitalization, initially given night took a loading dose. -Currently on 500 mg by mouth twice a day -No seizure activity noted  3.  Chronic obstructive pulmonary disease -Stable, she did not have significant wheezing on exam  4.  Chronic pain syndrome -Patient presently on oxycodone 5 mg by mouth 3 times a day as needed for severe pain -She is requesting that I increased her dose of narcotics to 15 mg however I explained to her my concerns for narcotic overdose and the possibility of serious injury including death.  -She does not appear to be in severe pain during my encounter, will not not make any changes to her pain regimen  5.  Acute kidney injury. -Patient presenting with a creatinine 1.7 and BUN of 33, result administration of IV fluids  Code Status: Full code Family Communication: Spoke with patient's husband present at bedside Disposition Plan: Anticipate discharge in a.m. if she remains stable   Consultants:  Pulmonary critical care medicine  Procedures: LINES / TUBES:  ETT 5/3 >> 5/04  CULTURES:  Urine 5/3 >> E coli  Blood 5/03 >>  ANTIBIOTICS:  Ceftrx 5/3 >> 5/05   HPI/Subjective: Patient is a pleasant 58 year old female with a past medical history of chronic pain syndrome, narcotic dependent, who was admitted to the pulmonary critical care service on 04/14/2014 presenting with acute  hypercarbic hypoxemic respiratory failure. It was suspected that respiratory failure was secondary to narcotic analgesic overdose. Patient stating taking approximately 15 mg of oxycodone 4 times daily which she states this is normal for her. Patient undergoing rapid sequence intubation, with removal of ET tube on 04/15/2014. She was transferred out of the intensive care unit on 04/16/2014 in stable condition.   Objective: Filed Vitals:   04/17/14 0520  BP: 144/90  Pulse: 77  Temp: 98 F (36.7 C)  Resp: 16    Intake/Output Summary (Last 24 hours) at 04/17/14 1531 Last data filed at 04/17/14 1300  Gross per 24 hour  Intake    320 ml  Output      0 ml  Net    320 ml   Filed Weights   04/14/14 1600 04/15/14 0500 04/16/14 0500  Weight: 94 kg (207 lb 3.7 oz) 94.8 kg (208 lb 15.9 oz) 90.6 kg (199 lb 11.8 oz)    Exam:   General:  Patient was ambulated down the hallway, did well although desaturated to mid 80s on room air  Cardiovascular: Regular rate rhythm normal S1-S2 S1   Respiratory: Clear to auscultation bilaterally, diminished breath sounds  Abdomen: Soft nontender nondistended  Musculoskeletal: No edema  Data Reviewed: Basic Metabolic Panel:  Recent Labs Lab 04/14/14 1146 04/15/14 0309  NA 136* 140  K 4.6 4.3  CL 97 106  CO2 26 23  GLUCOSE 108* 155*  BUN 33* 21  CREATININE 1.70* 0.91  CALCIUM 8.5 8.5  MG  --  1.8  PHOS  --  2.9   Liver Function Tests:  Recent Labs Lab 04/14/14 1146  AST  41*  ALT 18  ALKPHOS 26*  BILITOT 0.3  PROT 6.9  ALBUMIN 3.3*   No results found for this basename: LIPASE, AMYLASE,  in the last 168 hours No results found for this basename: AMMONIA,  in the last 168 hours CBC:  Recent Labs Lab 04/14/14 1146 04/15/14 0309  WBC 11.4* 9.5  NEUTROABS 9.2*  --   HGB 13.3 12.8  HCT 41.2 39.7  MCV 96.7 95.9  PLT 181 162   Cardiac Enzymes:  Recent Labs Lab 04/14/14 1146  TROPONINI <0.30   BNP (last 3 results)  Recent  Labs  04/27/13 2025 04/14/14 1146  PROBNP 684.9* 3283.0*   CBG:  Recent Labs Lab 04/16/14 0435 04/16/14 0820 04/16/14 1121 04/16/14 1547 04/16/14 2158  GLUCAP 88 83 82 88 83    Recent Results (from the past 240 hour(s))  CULTURE, BLOOD (ROUTINE X 2)     Status: None   Collection Time    04/14/14 11:46 AM      Result Value Ref Range Status   Specimen Description BLOOD RIGHT HAND   Final   Special Requests BOTTLES DRAWN AEROBIC AND ANAEROBIC 5CC   Final   Culture  Setup Time     Final   Value: 04/14/2014 18:52     Performed at [facility]   Culture     Final   Value:        BLOOD CULTURE RECEIVED NO GROWTH TO DATE CULTURE WILL BE HELD FOR 5 DAYS BEFORE ISSUING A FINAL NEGATIVE REPORT     Performed at [facility]   Report Status PENDING   Incomplete  CULTURE, BLOOD (ROUTINE X 2)     Status: None   Collection Time    04/14/14 11:50 AM      Result Value Ref Range Status   Specimen Description BLOOD RIGHT FOREARM   Final   Special Requests BOTTLES DRAWN AEROBIC AND ANAEROBIC 5CC   Final   Culture  Setup Time     Final   Value: 04/14/2014 18:52     Performed at [facility]   Culture     Final   Value:        BLOOD CULTURE RECEIVED NO GROWTH TO DATE CULTURE WILL BE HELD FOR 5 DAYS BEFORE ISSUING A FINAL NEGATIVE REPORT     Performed at [facility]   Report Status PENDING   Incomplete  URINE CULTURE     Status: None   Collection Time    04/14/14 12:17 PM      Result Value Ref Range Status   Specimen Description URINE, CATHETERIZED   Final   Special Requests NONE   Final   Culture  Setup Time     Final   Value: 04/14/2014 17:39     Performed at [facility]     Final   Value: >=100,000 COLONIES/ML     Performed at [facility]   Culture     Final   Value: ESCHERICHIA COLI     Performed at [facility]   Report Status 04/16/2014 FINAL   Final   Organism ID, Bacteria ESCHERICHIA COLI   Final   MRSA PCR SCREENING     Status: None   Collection Time    04/14/14  4:34 PM      Result Value Ref Range Status   MRSA by PCR NEGATIVE  NEGATIVE Final   Comment:  The GeneXpert MRSA Assay (FDA     approved for NASAL specimens     only), is one component of a     comprehensive MRSA colonization     surveillance program. It is not     intended to diagnose MRSA     infection nor to guide or     monitor treatment for     MRSA infections.     Studies: No results found.  Scheduled Meds: . busPIRone  30 mg Oral BID  . chlorhexidine  15 mL Mouth Rinse BID  . enoxaparin (LOVENOX) injection  40 mg Subcutaneous Daily  . ipratropium-albuterol  3 mL Nebulization Q6H  . levETIRAcetam  500 mg Oral BID  . mirtazapine  15 mg Oral QHS  . multivitamin with minerals  1 tablet Oral Daily  . pantoprazole  40 mg Oral q morning - 10a  . traZODone  50 mg Oral QHS   Continuous Infusions: . dextrose 20 mL/hr at 04/15/14 1151    Active Problems:   Acute renal failure   Acute respiratory failure   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    Time spent: 35 min    [facility] Pager [phone]. If 7PM-7AM, please contact night-coverage at [email or web address], password [facility] 04/17/2014, 3:31 PM  LOS: 3 days

## 2014-04-17 NOTE — Progress Notes (Signed)
NUTRITION FOLLOW-UP  DOCUMENTATION CODES Per approved criteria  -Obesity Unspecified   INTERVENTION: Add Resource Breeze po prn, each supplement provides 250 kcal and 9 grams of protein. Add MVI daily. RD to continue to follow nutrition care plan  NUTRITION DIAGNOSIS: Inadequate oral intake now r/t pain AEB pt report.  Goal: Meet >/=90% of estimated nutrition needs - unmet  Monitor:  PO intake, weight trend, labs  ASSESSMENT: 58 y/o F, smoker admitted with hypercarbic respiratory failure + UTI. PMHx significant for depression / anxiety, chronic pain with concern for polypharmacy / abuse, diet controlled DM, HTN, HLD, arthritis, and COPD (not on inhalers, no oxygen) who presented to [facility] ER on 5/3 via EMS after being found altered by her family with mumbling speech, jerking movements of her arms.  Patient intubated on 5/3 and extubated 5/4.   Pt advanced to Carbohydrate Modified diet yesterday prior to lunch. She has refused all meals. Reports that she is in pain and is currently awaiting pain medicine. States that she is going through withdrawals and plans to not eat anything today. Discussed nutrition supplements to which she adamantly refuses. Discussed with RN - plan to order Breeze prn to see if she may take with medications. RN reports that pt has been confused all morning.  Ordered for remeron q pm.  Height: Ht Readings from Last 1 Encounters:  04/14/14 5\' 7"  (1.702 m)    Weight: Wt Readings from Last 1 Encounters:  04/16/14 199 lb 11.8 oz (90.6 kg)  Admit wt 185 lb  BMI:  Body mass index is 31.28 kg/(m^2). Obesity Class I  Estimated Nutritional Needs: Kcal: 1800-2000 Protein: 95-110 g  Fluid: >/=1.9 L/day   Skin: WDL  Diet Order: Carbohydrate Modified Medium    Intake/Output Summary (Last 24 hours) at 04/17/14 1119 Last data filed at 04/16/14 1700  Gross per 24 hour  Intake    360 ml  Output   1450 ml  Net  -1090 ml    Last BM: 5/5  Labs:   Recent  Labs Lab 04/14/14 1146 04/15/14 0309  NA 136* 140  K 4.6 4.3  CL 97 106  CO2 26 23  BUN 33* 21  CREATININE 1.70* 0.91  CALCIUM 8.5 8.5  MG  --  1.8  PHOS  --  2.9  GLUCOSE 108* 155*    CBG (last 3)   Recent Labs  04/16/14 1121 04/16/14 1547 04/16/14 2158  GLUCAP 82 88 83    Scheduled Meds: . busPIRone  30 mg Oral BID  . chlorhexidine  15 mL Mouth Rinse BID  . enoxaparin (LOVENOX) injection  40 mg Subcutaneous Daily  . ipratropium-albuterol  3 mL Nebulization Q6H  . levETIRAcetam  500 mg Oral BID  . mirtazapine  15 mg Oral QHS  . pantoprazole  40 mg Oral q morning - 10a  . traZODone  50 mg Oral QHS    Continuous Infusions: . dextrose 20 mL/hr at 04/15/14 1151    Inda Coke MS, RD, LDN Inpatient Registered Dietitian Pager: [phone] After-hours pager: [phone]

## 2014-04-18 DIAGNOSIS — R404 Transient alteration of awareness: Secondary | ICD-10-CM

## 2014-04-18 DIAGNOSIS — G894 Chronic pain syndrome: Secondary | ICD-10-CM

## 2014-04-18 DIAGNOSIS — T50901A Poisoning by unspecified drugs, medicaments and biological substances, accidental (unintentional), initial encounter: Secondary | ICD-10-CM | POA: Diagnosis present

## 2014-04-18 DIAGNOSIS — G40909 Epilepsy, unspecified, not intractable, without status epilepticus: Secondary | ICD-10-CM

## 2014-04-18 LAB — GLUCOSE, CAPILLARY: Glucose-Capillary: 85 mg/dL (ref 70–99)

## 2014-04-18 MED ORDER — LEVETIRACETAM 500 MG PO TABS
500.0000 mg | ORAL_TABLET | Freq: Two times a day (BID) | ORAL | Status: DC
Start: 2014-04-18 — End: 2022-11-15

## 2014-04-18 MED ORDER — OXYCODONE HCL 5 MG PO TABS: 5.0000 mg | ORAL_TABLET | Freq: Four times a day (QID) | ORAL | Status: DC | PRN

## 2014-04-18 NOTE — Discharge Summary (Signed)
Physician Discharge Summary  [name] PRF:[id number] DOB: [birth year] DOA: 04/14/2014  PCP: Leonides Sake, MD  Admit date: 04/14/2014 Discharge date: 04/18/2014  Time spent: 35 minutes  Recommendations for Outpatient Follow-up:  1. Please follow up on pain management, she was advised to cut down on oxycodone dose to 5 mg PO q 6 hours PRN as I am concerned that narcotic overdose may have lead to respiratory failure. 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 On arrival to ER, patient was able to state birth date correctly but was obtunded otherwise. She began to have desaturations in ER and was intubated per EDP. Further work up noted the patient to have urine positive for UTI. Initial cxr with mild pulmonary vascular congestion. ABG reflective of hypoxemic, hypercarbic respiratory failure (acute). PCCM called for ICU admit. No family available at time of exam.    Hospital Course:  Patient is a pleasant 58 year old female with a past medical history chronic obstructive pulmonary disease, chronic pain syndrome, on multiple psychotropic medications including oxycodone, which per medical record  she had been taking 30 mg every 4 hours, admitted to the pulmonary critical care service on 04/14/2014. She was found to have mental status changes by family members, brought to the emergency department on arrival in acute hypoxemic respiratory failure, intubated and admitted to the intensive care unit. Initial chest x-ray showed pulmonary vascular congestion, without evidence of infiltrate. It was suspected that respiratory failure was secondary to narcotic analgesic overdose in combination with multiple other psychotropic medications. It is also possible that underlying seizure disorder may have contributed as well. Per records she has a history of temporal lobe epilepsy and had been noncompliant with Keppra therapy. Patient had stated "I didn't think I needed it." She was loaded with IV Keppra and continued on 500 mg by mouth twice a day. Patient was extubated on 04/15/2014, transferred to the telemetry on 04/17/2014. She remained stable from a respiratory standpoint. I spent an extensive amount of time counseling her on overuse of narcotic analgesics, and the potential to cause serious injury including death. Patient was restarted on oxycodone at 5 mg every 6 hours as needed despite her requests for higher doses. She was discharged with a new prescription for oxycodone 5 mg every 6 hours as needed quantity #5, instructed to stop taking the 30 mg tablets. I instructed her to followup with her pain clinic as soon as possible. She verbalized her understanding of these instructions. Patient discharged in stable condition on 04/18/2014.  Procedures: LINES / TUBES:  ETT 5/3 >> 5/04   Consultations:  Pulmonary Critical Care Medicine  Discharge Exam:  Filed Vitals:   04/18/14 0855  BP: 166/96  Pulse: 77  Temp: 98 F (36.7 C)  Resp: 18    General: Patient was ambulated down the hallway, did well  Cardiovascular: Regular rate rhythm normal S1-S2 S1  Respiratory: Clear to auscultation bilaterally,  diminished breath sounds  Abdomen: Soft nontender nondistended  Musculoskeletal: No edema   Discharge Instructions You were cared for by a hospitalist during your hospital stay. If you have any questions about your discharge medications or the care you received while you were in the hospital after you are discharged, you can call the unit and asked to speak with the hospitalist on call if the hospitalist that took care of you is not available. Once you are discharged, your primary care physician will handle any further medical issues. Please note that NO REFILLS for any discharge medications will be authorized once you are discharged, as it is imperative that you return to your primary care physician (or establish a relationship with a primary care physician if you do not have one) for your aftercare needs so that they can reassess your need for medications and monitor your lab values.  Discharge Orders   Future Orders Complete By Expires   Call MD for:  difficulty breathing, headache or visual disturbances  As directed    Call MD for:  extreme fatigue  As directed    Call MD for:  persistant dizziness or light-headedness  As directed    Call MD for:  persistant nausea and vomiting  As directed    Call MD for:  severe uncontrolled pain  As directed    Diet - low sodium heart healthy  As directed    Discharge instructions  As directed    Increase activity slowly  As directed        Medication List    STOP taking these medications       ESTROVEN MOOD & MEMORY Tabs     fluconazole 100 MG tablet  Commonly known as:  DIFLUCAN     ondansetron 4 MG tablet  Commonly known as:  ZOFRAN     promethazine 25 MG tablet  Commonly known as:  PHENERGAN      TAKE these medications       albuterol 108 (90 BASE) MCG/ACT inhaler  Commonly known as:  PROVENTIL HFA;VENTOLIN HFA  Inhale 2 puffs into the lungs every 6 (six) hours as needed. For shortness of breath/wheezing.     amLODipine 10 MG  tablet  Commonly known as:  NORVASC  Take 10 mg by mouth daily.     busPIRone 30 MG tablet  Commonly known as:  BUSPAR  Take 30 mg by mouth 2 (two) times daily.     escitalopram 20 MG tablet  Commonly known as:  LEXAPRO  Take 20 mg by mouth every morning.     fenofibrate micronized 134 MG capsule  Commonly known as:  LOFIBRA  Take 134 mg by mouth daily before breakfast.     furosemide 40 MG tablet  Commonly known as:  LASIX  Take 40 mg by mouth daily.     levETIRAcetam 500 MG tablet  Commonly known as:  KEPPRA  Take 1 tablet (500 mg total) by mouth 2 (two) times daily.     oxyCODONE 5 MG immediate release tablet  Commonly known as:  Oxy IR/ROXICODONE  Take 1 tablet (5 mg total) by mouth every 6 (six) hours as needed for severe pain.     pantoprazole  40 MG tablet  Commonly known as:  PROTONIX  Take 40 mg by mouth every morning.     pregabalin 100 MG capsule  Commonly known as:  LYRICA  Take 100 mg by mouth at bedtime.     PREVNAR 13 Susp injection  Generic drug:  pneumococcal 13-valent conjugate vaccine  Inject 1 mL into the muscle once.     tiZANidine 2 MG tablet  Commonly known as:  ZANAFLEX  Take 2 mg by mouth at bedtime.     traZODone 50 MG tablet  Commonly known as:  DESYREL  Take 50 mg by mouth at bedtime.     TYLENOL ALLERGY MULTI-SYMPTOM 25-5-325 MG Tabs  Generic drug:  Diphenhydramine-PE-APAP  Take 2 capsules by mouth daily as needed (for cold).       Allergies  Allergen Reactions  . Acetaminophen Other (See Comments)    Kidney issues  . Aspirin Other (See Comments)    Dr. Michela Pitcher do not take due to kidneys  . Nsaids Other (See Comments)    Pt states it messes up her kidneys  . Vicodin [Hydrocodone-Acetaminophen] Nausea And Vomiting       Follow-up Information   Follow up with [facility], MD In 1 week.   Specialty:  Family Medicine   Contact information:   Dr. Daiva Eves [address] [place] [address] [phone]         The results of significant diagnostics from this hospitalization (including imaging, microbiology, ancillary and laboratory) are listed below for reference.    Significant Diagnostic Studies: Dg Chest Port 1 View  04/15/2014   CLINICAL DATA:  Intubated patient.  EXAM: PORTABLE CHEST - 1 VIEW  COMPARISON:  Single view of the chest 04/14/2014.  FINDINGS: Endotracheal tube is in place with the tip in good position just below the clavicular heads. NG tube courses into the stomach. Mild subsegmental atelectasis is seen in the right lung base. The lungs are otherwise clear. Heart size is normal. No pneumothorax or pleural effusion.  IMPRESSION: ET tube in good position.  No acute finding.   Electronically Signed   By: Inge Rise M.D.   On: 04/15/2014 07:11   Dg Chest Portable 1 View  04/14/2014   CLINICAL DATA:  Endotracheal tube  EXAM: PORTABLE CHEST - 1 VIEW  COMPARISON:  04/14/2014  FINDINGS: Endotracheal tube tip lies 15 mm above the chronic, well-positioned.  Pulmonary vascular congestion is stable. No other change from the earlier exam.  IMPRESSION: 1. Endotracheal tube is well positioned. No other change from the earlier study.   Electronically Signed   By: Lajean Manes M.D.   On: 04/14/2014 13:10   Dg Chest Port 1 View  04/14/2014   CLINICAL DATA:  Altered mental status  EXAM: PORTABLE CHEST - 1 VIEW  COMPARISON:  04/27/2013  FINDINGS: Mild cardiac enlargement. Lung volumes are low and there is pulmonary vascular congestion. No airspace consolidation.  IMPRESSION: 1. Pulmonary vascular congestion.   Electronically Signed   By: Kerby Moors M.D.   On: 04/14/2014 12:19   Dg Abd Portable 1v  04/14/2014   CLINICAL DATA:  Orogastric tube placement  EXAM: PORTABLE ABDOMEN - 1 VIEW  COMPARISON:  04/25/2013  FINDINGS: An orogastric tube tip projects over the right lower quadrant, compatible with distal gastric or proximal duodenal position. There is no evidence of gaseous gastric distention.  Although the lower and right abdomen is excluded, no evidence of bowel obstruction.  IMPRESSION: Orogastric tube tip in  the distal stomach or proximal duodenum.   Electronically Signed   By: Tiburcio Pea M.D.   On: 04/14/2014 23:43   Mm Digital Screening Bilateral  03/20/2014   CLINICAL DATA:  Screening.  EXAM: DIGITAL SCREENING BILATERAL MAMMOGRAM WITH CAD  COMPARISON:  Previous exam(s).  ACR Breast Density Category b: There are scattered areas of fibroglandular density.  FINDINGS: There are no findings suspicious for malignancy. Images were processed with CAD.  IMPRESSION: No mammographic evidence of malignancy. A result letter of this screening mammogram will be mailed directly to the patient.  RECOMMENDATION: Screening mammogram in one year. (Code:SM-B-01Y)  BI-RADS CATEGORY  1: Negative.   Electronically Signed   By: Baird Lyons M.D.   On: 03/20/2014 10:23    Microbiology: Recent Results (from the past 240 hour(s))  CULTURE, BLOOD (ROUTINE X 2)     Status: None   Collection Time    04/14/14 11:46 AM      Result Value Ref Range Status   Specimen Description BLOOD RIGHT HAND   Final   Special Requests BOTTLES DRAWN AEROBIC AND ANAEROBIC 5CC   Final   Culture  Setup Time     Final   Value: 04/14/2014 18:52     Performed at [facility]   Culture     Final   Value:        BLOOD CULTURE RECEIVED NO GROWTH TO DATE CULTURE WILL BE HELD FOR 5 DAYS BEFORE ISSUING A FINAL NEGATIVE REPORT     Performed at [facility]   Report Status PENDING   Incomplete  CULTURE, BLOOD (ROUTINE X 2)     Status: None   Collection Time    04/14/14 11:50 AM      Result Value Ref Range Status   Specimen Description BLOOD RIGHT FOREARM   Final   Special Requests BOTTLES DRAWN AEROBIC AND ANAEROBIC 5CC   Final   Culture  Setup Time     Final   Value: 04/14/2014 18:52     Performed at [facility]   Culture     Final   Value:        BLOOD CULTURE RECEIVED NO GROWTH TO DATE CULTURE WILL BE  HELD FOR 5 DAYS BEFORE ISSUING A FINAL NEGATIVE REPORT     Performed at [facility]   Report Status PENDING   Incomplete  URINE CULTURE     Status: None   Collection Time    04/14/14 12:17 PM      Result Value Ref Range Status   Specimen Description URINE, CATHETERIZED   Final   Special Requests NONE   Final   Culture  Setup Time     Final   Value: 04/14/2014 17:39     Performed at [facility] Count     Final   Value: >=100,000 COLONIES/ML     Performed at [facility]   Culture     Final   Value: ESCHERICHIA COLI     Performed at [facility]   Report Status 04/16/2014 FINAL   Final   Organism ID, Bacteria ESCHERICHIA COLI   Final  MRSA PCR SCREENING     Status: None   Collection Time    04/14/14  4:34 PM      Result Value Ref Range Status   MRSA by PCR NEGATIVE  NEGATIVE Final   Comment:            The GeneXpert  MRSA Assay (FDA     approved for NASAL specimens     only), is one component of a     comprehensive MRSA colonization     surveillance program. It is not     intended to diagnose MRSA     infection nor to guide or     monitor treatment for     MRSA infections.     Labs: Basic Metabolic Panel:  Recent Labs Lab 04/14/14 1146 04/15/14 0309  NA 136* 140  K 4.6 4.3  CL 97 106  CO2 26 23  GLUCOSE 108* 155*  BUN 33* 21  CREATININE 1.70* 0.91  CALCIUM 8.5 8.5  MG  --  1.8  PHOS  --  2.9   Liver Function Tests:  Recent Labs Lab 04/14/14 1146  AST 41*  ALT 18  ALKPHOS 26*  BILITOT 0.3  PROT 6.9  ALBUMIN 3.3*   No results found for this basename: LIPASE, AMYLASE,  in the last 168 hours No results found for this basename: AMMONIA,  in the last 168 hours CBC:  Recent Labs Lab 04/14/14 1146 04/15/14 0309  WBC 11.4* 9.5  NEUTROABS 9.2*  --   HGB 13.3 12.8  HCT 41.2 39.7  MCV 96.7 95.9  PLT 181 162   Cardiac Enzymes:  Recent Labs Lab 04/14/14 1146  TROPONINI <0.30   BNP: BNP (last 3  results)  Recent Labs  04/27/13 2025 04/14/14 1146  PROBNP 684.9* 3283.0*   CBG:  Recent Labs Lab 04/16/14 0820 04/16/14 1121 04/16/14 1547 04/16/14 2158 04/18/14 0858  GLUCAP 83 82 88 83 85       Signed:  [facility] 04/18/2014, 10:02 AM

## 2014-04-18 NOTE — Progress Notes (Signed)
Patient discharged.  Patient educated on discharge instructions, discharge medications, and follow- up appointment.  Follow- up appointment scheduled by secretary.  Patient given hard copy of prescriptions for Keppra and Oxycodone.  Patient educated by MD on importance of following up at pain clinic and following prescription for pain medication.  Education reinforced by Therapist, sports.  Patient verbalized understanding.  AVS signed.  Belongings gathered.  IV removed.  Ambulated to car with RN.

## 2014-04-18 NOTE — Progress Notes (Signed)
PT Cancellation Note  Patient Details Name: SOFIYA EZELLE MRN: [id number] DOB: 1956-03-13   Cancelled Treatment:    Reason Eval/Treat Not Completed: Other (comment) (Pt's spouse stated pt walked in hall earlier today and did fine. Pt is to DC home today. Spouse declined PT services, pt sleeping soundly. ) PT signing off.    Oletta Darter [place], [place]  04/18/2014, 10:32 AM

## 2014-04-18 NOTE — Care Management Note (Signed)
CARE MANAGEMENT NOTE 04/18/2014  Patient:  [name], [name]   Account Number:  [id number]  Date Initiated:  04/15/2014  Documentation initiated by:  Elissa Hefty  Subjective/Objective Assessment:   adm w resp failure, vent     Action/Plan:   lives w husband, pcp dr Timoteo Ace hamrick  04/18/14 Met with pt and obtained signed IM.  No d/c needs.   Anticipated DC Date:  04/18/2014   Anticipated DC Plan:  HOME/SELF CARE         Choice offered to / List presented to:             Status of service:  Completed, signed off Medicare Important Message given?  YES (If response is "NO", the following Medicare IM given date fields will be blank) Date Medicare IM given:  04/18/2014 Date Additional Medicare IM given:    Discharge Disposition:  HOME/SELF CARE  Per UR Regulation:  Reviewed for med. necessity/level of care/duration of stay  If discussed at [place] of Stay Meetings, dates discussed:    Comments:

## 2014-04-20 LAB — CULTURE, BLOOD (ROUTINE X 2)
CULTURE: NO GROWTH
Culture: NO GROWTH

## 2014-04-30 ENCOUNTER — Other Ambulatory Visit: Payer: Self-pay | Admitting: Gastroenterology

## 2014-04-30 DIAGNOSIS — R110 Nausea: Secondary | ICD-10-CM

## 2014-05-20 ENCOUNTER — Ambulatory Visit (HOSPITAL_COMMUNITY)
Admission: RE | Admit: 2014-05-20 | Discharge: 2014-05-20 | Disposition: A | Discharge status: Home / Self Care | Payer: PRIVATE HEALTH INSURANCE | Source: Ambulatory Visit | Attending: Gastroenterology | Admitting: Gastroenterology

## 2014-05-20 DIAGNOSIS — R110 Nausea: Secondary | ICD-10-CM

## 2014-06-04 ENCOUNTER — Encounter (HOSPITAL_COMMUNITY): Payer: PRIVATE HEALTH INSURANCE

## 2014-06-18 ENCOUNTER — Encounter (HOSPITAL_COMMUNITY): Payer: PRIVATE HEALTH INSURANCE

## 2014-07-03 ENCOUNTER — Encounter (HOSPITAL_COMMUNITY): Payer: Self-pay

## 2014-07-03 ENCOUNTER — Encounter (HOSPITAL_COMMUNITY)
Admission: RE | Admit: 2014-07-03 | Discharge: 2014-07-03 | Disposition: A | Discharge status: Home / Self Care | Payer: PRIVATE HEALTH INSURANCE | Source: Ambulatory Visit | Attending: Gastroenterology | Admitting: Gastroenterology

## 2014-07-03 DIAGNOSIS — R110 Nausea: Secondary | ICD-10-CM | POA: Diagnosis present

## 2014-07-03 MED ORDER — TECHNETIUM TC 99M SULFUR COLLOID
2.2000 | Freq: Once | INTRAVENOUS | Status: AC | PRN
  Administered 2014-07-03: 2.2 via INTRAVENOUS

## 2015-04-03 ENCOUNTER — Other Ambulatory Visit (HOSPITAL_COMMUNITY): Payer: Self-pay | Admitting: Family Medicine

## 2015-04-03 DIAGNOSIS — Z780 Asymptomatic menopausal state: Secondary | ICD-10-CM

## 2015-04-03 DIAGNOSIS — Z1231 Encounter for screening mammogram for malignant neoplasm of breast: Secondary | ICD-10-CM

## 2015-04-15 ENCOUNTER — Ambulatory Visit (HOSPITAL_COMMUNITY): Payer: Medicaid Other

## 2015-04-15 ENCOUNTER — Ambulatory Visit (HOSPITAL_COMMUNITY): Payer: Medicaid Other | Attending: Family Medicine

## 2015-05-07 ENCOUNTER — Ambulatory Visit (HOSPITAL_COMMUNITY)
Admission: RE | Admit: 2015-05-07 | Discharge: 2015-05-07 | Disposition: A | Discharge status: Home / Self Care | Payer: Medicaid Other | Source: Ambulatory Visit | Attending: Family Medicine | Admitting: Family Medicine

## 2015-05-07 DIAGNOSIS — Z13820 Encounter for screening for osteoporosis: Secondary | ICD-10-CM | POA: Diagnosis present

## 2015-05-07 DIAGNOSIS — Z780 Asymptomatic menopausal state: Secondary | ICD-10-CM

## 2015-05-07 DIAGNOSIS — M810 Age-related osteoporosis without current pathological fracture: Secondary | ICD-10-CM | POA: Insufficient documentation

## 2015-05-07 DIAGNOSIS — Z1231 Encounter for screening mammogram for malignant neoplasm of breast: Secondary | ICD-10-CM

## 2015-06-25 DIAGNOSIS — M1711 Unilateral primary osteoarthritis, right knee: Secondary | ICD-10-CM | POA: Diagnosis not present

## 2015-06-25 DIAGNOSIS — M179 Osteoarthritis of knee, unspecified: Secondary | ICD-10-CM | POA: Diagnosis not present

## 2015-06-25 DIAGNOSIS — Z6828 Body mass index (BMI) 28.0-28.9, adult: Secondary | ICD-10-CM | POA: Diagnosis not present

## 2015-06-25 DIAGNOSIS — M545 Low back pain: Secondary | ICD-10-CM | POA: Diagnosis not present

## 2015-07-09 DIAGNOSIS — I10 Essential (primary) hypertension: Secondary | ICD-10-CM | POA: Diagnosis not present

## 2015-07-09 DIAGNOSIS — E782 Mixed hyperlipidemia: Secondary | ICD-10-CM | POA: Diagnosis not present

## 2015-07-09 DIAGNOSIS — F418 Other specified anxiety disorders: Secondary | ICD-10-CM | POA: Diagnosis not present

## 2015-07-09 DIAGNOSIS — E119 Type 2 diabetes mellitus without complications: Secondary | ICD-10-CM | POA: Diagnosis not present

## 2015-07-09 DIAGNOSIS — N183 Chronic kidney disease, stage 3 (moderate): Secondary | ICD-10-CM | POA: Diagnosis not present

## 2015-07-16 DIAGNOSIS — G894 Chronic pain syndrome: Secondary | ICD-10-CM | POA: Diagnosis not present

## 2015-07-16 DIAGNOSIS — M545 Low back pain: Secondary | ICD-10-CM | POA: Diagnosis not present

## 2015-07-16 DIAGNOSIS — M5416 Radiculopathy, lumbar region: Secondary | ICD-10-CM | POA: Diagnosis not present

## 2015-07-16 DIAGNOSIS — M961 Postlaminectomy syndrome, not elsewhere classified: Secondary | ICD-10-CM | POA: Diagnosis not present

## 2015-09-17 DIAGNOSIS — N3946 Mixed incontinence: Secondary | ICD-10-CM | POA: Diagnosis not present

## 2015-09-17 DIAGNOSIS — M6281 Muscle weakness (generalized): Secondary | ICD-10-CM | POA: Diagnosis not present

## 2015-09-17 DIAGNOSIS — R350 Frequency of micturition: Secondary | ICD-10-CM | POA: Diagnosis not present

## 2015-09-17 DIAGNOSIS — Z23 Encounter for immunization: Secondary | ICD-10-CM | POA: Diagnosis not present

## 2015-09-17 DIAGNOSIS — Z6831 Body mass index (BMI) 31.0-31.9, adult: Secondary | ICD-10-CM | POA: Diagnosis not present

## 2015-10-29 DIAGNOSIS — M545 Low back pain: Secondary | ICD-10-CM | POA: Diagnosis not present

## 2015-10-29 DIAGNOSIS — Z6834 Body mass index (BMI) 34.0-34.9, adult: Secondary | ICD-10-CM | POA: Diagnosis not present

## 2015-10-29 DIAGNOSIS — Z79899 Other long term (current) drug therapy: Secondary | ICD-10-CM | POA: Diagnosis not present

## 2015-10-29 DIAGNOSIS — Z79891 Long term (current) use of opiate analgesic: Secondary | ICD-10-CM | POA: Diagnosis not present

## 2015-10-29 DIAGNOSIS — M5416 Radiculopathy, lumbar region: Secondary | ICD-10-CM | POA: Diagnosis not present

## 2015-10-29 DIAGNOSIS — Z5181 Encounter for therapeutic drug level monitoring: Secondary | ICD-10-CM | POA: Diagnosis not present

## 2015-10-29 DIAGNOSIS — M961 Postlaminectomy syndrome, not elsewhere classified: Secondary | ICD-10-CM | POA: Diagnosis not present

## 2015-10-29 DIAGNOSIS — G894 Chronic pain syndrome: Secondary | ICD-10-CM | POA: Diagnosis not present

## 2015-11-03 DIAGNOSIS — Z1389 Encounter for screening for other disorder: Secondary | ICD-10-CM | POA: Diagnosis not present

## 2015-11-03 DIAGNOSIS — B070 Plantar wart: Secondary | ICD-10-CM | POA: Diagnosis not present

## 2015-11-19 DIAGNOSIS — F418 Other specified anxiety disorders: Secondary | ICD-10-CM | POA: Diagnosis not present

## 2015-11-19 DIAGNOSIS — I10 Essential (primary) hypertension: Secondary | ICD-10-CM | POA: Diagnosis not present

## 2015-11-19 DIAGNOSIS — E119 Type 2 diabetes mellitus without complications: Secondary | ICD-10-CM | POA: Diagnosis not present

## 2015-11-19 DIAGNOSIS — Z6832 Body mass index (BMI) 32.0-32.9, adult: Secondary | ICD-10-CM | POA: Diagnosis not present

## 2015-11-19 DIAGNOSIS — Z7689 Persons encountering health services in other specified circumstances: Secondary | ICD-10-CM | POA: Diagnosis not present

## 2015-11-19 DIAGNOSIS — E782 Mixed hyperlipidemia: Secondary | ICD-10-CM | POA: Diagnosis not present

## 2015-11-19 DIAGNOSIS — N183 Chronic kidney disease, stage 3 (moderate): Secondary | ICD-10-CM | POA: Diagnosis not present

## 2015-11-25 DIAGNOSIS — Z6833 Body mass index (BMI) 33.0-33.9, adult: Secondary | ICD-10-CM | POA: Diagnosis not present

## 2015-11-25 DIAGNOSIS — R208 Other disturbances of skin sensation: Secondary | ICD-10-CM | POA: Diagnosis not present

## 2015-12-10 DIAGNOSIS — Z6834 Body mass index (BMI) 34.0-34.9, adult: Secondary | ICD-10-CM | POA: Diagnosis not present

## 2015-12-10 DIAGNOSIS — B070 Plantar wart: Secondary | ICD-10-CM | POA: Diagnosis not present

## 2015-12-30 DIAGNOSIS — E119 Type 2 diabetes mellitus without complications: Secondary | ICD-10-CM | POA: Diagnosis not present

## 2015-12-30 DIAGNOSIS — K219 Gastro-esophageal reflux disease without esophagitis: Secondary | ICD-10-CM | POA: Diagnosis not present

## 2015-12-30 DIAGNOSIS — J180 Bronchopneumonia, unspecified organism: Secondary | ICD-10-CM | POA: Diagnosis not present

## 2015-12-30 DIAGNOSIS — J441 Chronic obstructive pulmonary disease with (acute) exacerbation: Secondary | ICD-10-CM | POA: Diagnosis not present

## 2015-12-30 DIAGNOSIS — Z79899 Other long term (current) drug therapy: Secondary | ICD-10-CM | POA: Diagnosis not present

## 2015-12-30 DIAGNOSIS — E7800 Pure hypercholesterolemia, unspecified: Secondary | ICD-10-CM | POA: Diagnosis not present

## 2015-12-30 DIAGNOSIS — J440 Chronic obstructive pulmonary disease with acute lower respiratory infection: Secondary | ICD-10-CM | POA: Diagnosis not present

## 2015-12-30 DIAGNOSIS — I119 Hypertensive heart disease without heart failure: Secondary | ICD-10-CM | POA: Diagnosis not present

## 2015-12-30 DIAGNOSIS — I2510 Atherosclerotic heart disease of native coronary artery without angina pectoris: Secondary | ICD-10-CM | POA: Diagnosis not present

## 2015-12-30 DIAGNOSIS — R05 Cough: Secondary | ICD-10-CM | POA: Diagnosis not present

## 2015-12-30 DIAGNOSIS — J9602 Acute respiratory failure with hypercapnia: Secondary | ICD-10-CM | POA: Diagnosis not present

## 2015-12-30 DIAGNOSIS — R0602 Shortness of breath: Secondary | ICD-10-CM | POA: Diagnosis not present

## 2015-12-30 DIAGNOSIS — E785 Hyperlipidemia, unspecified: Secondary | ICD-10-CM | POA: Diagnosis not present

## 2015-12-30 DIAGNOSIS — J9601 Acute respiratory failure with hypoxia: Secondary | ICD-10-CM | POA: Diagnosis not present

## 2015-12-30 DIAGNOSIS — J45909 Unspecified asthma, uncomplicated: Secondary | ICD-10-CM | POA: Diagnosis not present

## 2015-12-30 DIAGNOSIS — M1990 Unspecified osteoarthritis, unspecified site: Secondary | ICD-10-CM | POA: Diagnosis not present

## 2016-01-27 DIAGNOSIS — M5416 Radiculopathy, lumbar region: Secondary | ICD-10-CM | POA: Diagnosis not present

## 2016-01-27 DIAGNOSIS — M961 Postlaminectomy syndrome, not elsewhere classified: Secondary | ICD-10-CM | POA: Diagnosis not present

## 2016-01-29 DIAGNOSIS — J441 Chronic obstructive pulmonary disease with (acute) exacerbation: Secondary | ICD-10-CM | POA: Diagnosis not present

## 2016-01-29 DIAGNOSIS — J9601 Acute respiratory failure with hypoxia: Secondary | ICD-10-CM | POA: Diagnosis not present

## 2016-01-29 DIAGNOSIS — J180 Bronchopneumonia, unspecified organism: Secondary | ICD-10-CM | POA: Diagnosis not present

## 2016-02-02 DIAGNOSIS — I2510 Atherosclerotic heart disease of native coronary artery without angina pectoris: Secondary | ICD-10-CM | POA: Diagnosis not present

## 2016-02-05 DIAGNOSIS — H35033 Hypertensive retinopathy, bilateral: Secondary | ICD-10-CM | POA: Diagnosis not present

## 2016-02-20 ENCOUNTER — Other Ambulatory Visit: Payer: Self-pay | Admitting: Family Medicine

## 2016-02-20 DIAGNOSIS — R1011 Right upper quadrant pain: Secondary | ICD-10-CM

## 2016-02-23 DIAGNOSIS — M5124 Other intervertebral disc displacement, thoracic region: Secondary | ICD-10-CM | POA: Diagnosis not present

## 2016-02-23 DIAGNOSIS — R1011 Right upper quadrant pain: Secondary | ICD-10-CM | POA: Diagnosis not present

## 2016-02-27 ENCOUNTER — Other Ambulatory Visit: Payer: Medicaid Other

## 2016-03-11 DIAGNOSIS — M5414 Radiculopathy, thoracic region: Secondary | ICD-10-CM | POA: Diagnosis not present

## 2016-03-30 DIAGNOSIS — I5030 Unspecified diastolic (congestive) heart failure: Secondary | ICD-10-CM | POA: Diagnosis not present

## 2016-03-30 DIAGNOSIS — E669 Obesity, unspecified: Secondary | ICD-10-CM | POA: Diagnosis not present

## 2016-03-30 DIAGNOSIS — E782 Mixed hyperlipidemia: Secondary | ICD-10-CM | POA: Diagnosis not present

## 2016-03-30 DIAGNOSIS — E119 Type 2 diabetes mellitus without complications: Secondary | ICD-10-CM | POA: Diagnosis not present

## 2016-03-30 DIAGNOSIS — I10 Essential (primary) hypertension: Secondary | ICD-10-CM | POA: Diagnosis not present

## 2016-04-19 DIAGNOSIS — M5414 Radiculopathy, thoracic region: Secondary | ICD-10-CM | POA: Diagnosis not present

## 2016-06-02 DIAGNOSIS — M5416 Radiculopathy, lumbar region: Secondary | ICD-10-CM | POA: Diagnosis not present

## 2016-06-02 DIAGNOSIS — I10 Essential (primary) hypertension: Secondary | ICD-10-CM | POA: Diagnosis not present

## 2016-06-02 DIAGNOSIS — M5414 Radiculopathy, thoracic region: Secondary | ICD-10-CM | POA: Diagnosis not present

## 2016-06-02 DIAGNOSIS — M961 Postlaminectomy syndrome, not elsewhere classified: Secondary | ICD-10-CM | POA: Diagnosis not present

## 2016-06-10 DIAGNOSIS — B070 Plantar wart: Secondary | ICD-10-CM | POA: Diagnosis not present

## 2016-06-10 DIAGNOSIS — T148 Other injury of unspecified body region: Secondary | ICD-10-CM | POA: Diagnosis not present

## 2016-06-10 DIAGNOSIS — I10 Essential (primary) hypertension: Secondary | ICD-10-CM | POA: Diagnosis not present

## 2016-06-10 DIAGNOSIS — S81802A Unspecified open wound, left lower leg, initial encounter: Secondary | ICD-10-CM | POA: Diagnosis not present

## 2016-06-28 DIAGNOSIS — T148 Other injury of unspecified body region: Secondary | ICD-10-CM | POA: Diagnosis not present

## 2016-06-28 DIAGNOSIS — B070 Plantar wart: Secondary | ICD-10-CM | POA: Diagnosis not present

## 2016-07-19 DIAGNOSIS — B070 Plantar wart: Secondary | ICD-10-CM | POA: Diagnosis not present

## 2016-07-21 ENCOUNTER — Other Ambulatory Visit: Payer: Self-pay | Admitting: Family Medicine

## 2016-07-21 DIAGNOSIS — Z1231 Encounter for screening mammogram for malignant neoplasm of breast: Secondary | ICD-10-CM

## 2016-07-29 ENCOUNTER — Ambulatory Visit: Payer: Medicaid Other

## 2016-07-30 DIAGNOSIS — Z87891 Personal history of nicotine dependence: Secondary | ICD-10-CM | POA: Diagnosis not present

## 2016-07-30 DIAGNOSIS — I10 Essential (primary) hypertension: Secondary | ICD-10-CM | POA: Diagnosis not present

## 2016-07-30 DIAGNOSIS — N183 Chronic kidney disease, stage 3 (moderate): Secondary | ICD-10-CM | POA: Diagnosis not present

## 2016-07-30 DIAGNOSIS — E119 Type 2 diabetes mellitus without complications: Secondary | ICD-10-CM | POA: Diagnosis not present

## 2016-08-12 ENCOUNTER — Other Ambulatory Visit: Payer: Self-pay | Admitting: Family Medicine

## 2016-08-12 DIAGNOSIS — F17200 Nicotine dependence, unspecified, uncomplicated: Secondary | ICD-10-CM

## 2016-08-26 ENCOUNTER — Inpatient Hospital Stay: Admission: RE | Admit: 2016-08-26 | Payer: Medicaid Other | Source: Ambulatory Visit

## 2016-09-03 DIAGNOSIS — Z23 Encounter for immunization: Secondary | ICD-10-CM | POA: Diagnosis not present

## 2016-09-07 ENCOUNTER — Ambulatory Visit: Payer: Medicaid Other

## 2016-09-09 ENCOUNTER — Ambulatory Visit
Admission: RE | Admit: 2016-09-09 | Discharge: 2016-09-09 | Disposition: A | Discharge status: Home / Self Care | Payer: Medicaid Other | Source: Ambulatory Visit | Attending: Family Medicine | Admitting: Family Medicine

## 2016-09-09 DIAGNOSIS — Z87891 Personal history of nicotine dependence: Secondary | ICD-10-CM | POA: Diagnosis not present

## 2016-09-09 DIAGNOSIS — F17200 Nicotine dependence, unspecified, uncomplicated: Secondary | ICD-10-CM

## 2016-09-14 DIAGNOSIS — I10 Essential (primary) hypertension: Secondary | ICD-10-CM | POA: Diagnosis not present

## 2016-09-14 DIAGNOSIS — M5414 Radiculopathy, thoracic region: Secondary | ICD-10-CM | POA: Diagnosis not present

## 2016-09-14 DIAGNOSIS — M5416 Radiculopathy, lumbar region: Secondary | ICD-10-CM | POA: Diagnosis not present

## 2016-09-14 DIAGNOSIS — M961 Postlaminectomy syndrome, not elsewhere classified: Secondary | ICD-10-CM | POA: Diagnosis not present

## 2016-09-22 ENCOUNTER — Ambulatory Visit
Admission: RE | Admit: 2016-09-22 | Discharge: 2016-09-22 | Disposition: A | Discharge status: Home / Self Care | Payer: Medicaid Other | Source: Ambulatory Visit | Attending: Family Medicine | Admitting: Family Medicine

## 2016-09-22 DIAGNOSIS — Z1231 Encounter for screening mammogram for malignant neoplasm of breast: Secondary | ICD-10-CM

## 2016-12-14 DIAGNOSIS — M545 Low back pain: Secondary | ICD-10-CM | POA: Diagnosis not present

## 2016-12-14 DIAGNOSIS — I10 Essential (primary) hypertension: Secondary | ICD-10-CM | POA: Diagnosis not present

## 2016-12-14 DIAGNOSIS — M961 Postlaminectomy syndrome, not elsewhere classified: Secondary | ICD-10-CM | POA: Diagnosis not present

## 2017-01-06 DIAGNOSIS — I10 Essential (primary) hypertension: Secondary | ICD-10-CM | POA: Diagnosis not present

## 2017-01-06 DIAGNOSIS — Z1389 Encounter for screening for other disorder: Secondary | ICD-10-CM | POA: Diagnosis not present

## 2017-01-06 DIAGNOSIS — E119 Type 2 diabetes mellitus without complications: Secondary | ICD-10-CM | POA: Diagnosis not present

## 2017-02-03 DIAGNOSIS — J019 Acute sinusitis, unspecified: Secondary | ICD-10-CM | POA: Diagnosis not present

## 2017-02-03 DIAGNOSIS — M79662 Pain in left lower leg: Secondary | ICD-10-CM | POA: Diagnosis not present

## 2017-02-04 DIAGNOSIS — R2242 Localized swelling, mass and lump, left lower limb: Secondary | ICD-10-CM | POA: Diagnosis not present

## 2017-02-04 DIAGNOSIS — M79662 Pain in left lower leg: Secondary | ICD-10-CM | POA: Diagnosis not present

## 2017-02-07 DIAGNOSIS — I82819 Embolism and thrombosis of superficial veins of unspecified lower extremities: Secondary | ICD-10-CM | POA: Diagnosis not present

## 2017-03-10 DIAGNOSIS — M961 Postlaminectomy syndrome, not elsewhere classified: Secondary | ICD-10-CM | POA: Diagnosis not present

## 2017-03-10 DIAGNOSIS — M5414 Radiculopathy, thoracic region: Secondary | ICD-10-CM | POA: Diagnosis not present

## 2017-03-10 DIAGNOSIS — M5416 Radiculopathy, lumbar region: Secondary | ICD-10-CM | POA: Diagnosis not present

## 2017-03-10 DIAGNOSIS — Z79899 Other long term (current) drug therapy: Secondary | ICD-10-CM | POA: Diagnosis not present

## 2017-03-10 DIAGNOSIS — M545 Low back pain: Secondary | ICD-10-CM | POA: Diagnosis not present

## 2017-03-10 DIAGNOSIS — G894 Chronic pain syndrome: Secondary | ICD-10-CM | POA: Diagnosis not present

## 2017-03-19 DIAGNOSIS — G92 Toxic encephalopathy: Secondary | ICD-10-CM | POA: Diagnosis not present

## 2017-03-19 DIAGNOSIS — T796XXA Traumatic ischemia of muscle, initial encounter: Secondary | ICD-10-CM | POA: Diagnosis not present

## 2017-03-19 DIAGNOSIS — Z79891 Long term (current) use of opiate analgesic: Secondary | ICD-10-CM | POA: Diagnosis not present

## 2017-03-19 DIAGNOSIS — I10 Essential (primary) hypertension: Secondary | ICD-10-CM | POA: Diagnosis not present

## 2017-03-19 DIAGNOSIS — T424X1A Poisoning by benzodiazepines, accidental (unintentional), initial encounter: Secondary | ICD-10-CM | POA: Diagnosis not present

## 2017-03-19 DIAGNOSIS — N182 Chronic kidney disease, stage 2 (mild): Secondary | ICD-10-CM | POA: Diagnosis not present

## 2017-03-19 DIAGNOSIS — J440 Chronic obstructive pulmonary disease with acute lower respiratory infection: Secondary | ICD-10-CM | POA: Diagnosis not present

## 2017-03-19 DIAGNOSIS — R918 Other nonspecific abnormal finding of lung field: Secondary | ICD-10-CM | POA: Diagnosis not present

## 2017-03-19 DIAGNOSIS — E875 Hyperkalemia: Secondary | ICD-10-CM | POA: Diagnosis not present

## 2017-03-19 DIAGNOSIS — T402X1A Poisoning by other opioids, accidental (unintentional), initial encounter: Secondary | ICD-10-CM | POA: Diagnosis not present

## 2017-03-19 DIAGNOSIS — E785 Hyperlipidemia, unspecified: Secondary | ICD-10-CM | POA: Diagnosis not present

## 2017-03-19 DIAGNOSIS — R069 Unspecified abnormalities of breathing: Secondary | ICD-10-CM | POA: Diagnosis not present

## 2017-03-19 DIAGNOSIS — Z79899 Other long term (current) drug therapy: Secondary | ICD-10-CM | POA: Diagnosis not present

## 2017-03-19 DIAGNOSIS — B964 Proteus (mirabilis) (morganii) as the cause of diseases classified elsewhere: Secondary | ICD-10-CM | POA: Diagnosis not present

## 2017-03-19 DIAGNOSIS — Z7982 Long term (current) use of aspirin: Secondary | ICD-10-CM | POA: Diagnosis not present

## 2017-03-19 DIAGNOSIS — M1990 Unspecified osteoarthritis, unspecified site: Secondary | ICD-10-CM | POA: Diagnosis not present

## 2017-03-19 DIAGNOSIS — R55 Syncope and collapse: Secondary | ICD-10-CM | POA: Diagnosis not present

## 2017-03-19 DIAGNOSIS — N390 Urinary tract infection, site not specified: Secondary | ICD-10-CM | POA: Diagnosis not present

## 2017-03-19 DIAGNOSIS — R825 Elevated urine levels of drugs, medicaments and biological substances: Secondary | ICD-10-CM | POA: Diagnosis not present

## 2017-03-19 DIAGNOSIS — N179 Acute kidney failure, unspecified: Secondary | ICD-10-CM | POA: Diagnosis not present

## 2017-03-19 DIAGNOSIS — E1122 Type 2 diabetes mellitus with diabetic chronic kidney disease: Secondary | ICD-10-CM | POA: Diagnosis not present

## 2017-03-19 DIAGNOSIS — R404 Transient alteration of awareness: Secondary | ICD-10-CM | POA: Diagnosis not present

## 2017-03-19 DIAGNOSIS — K219 Gastro-esophageal reflux disease without esophagitis: Secondary | ICD-10-CM | POA: Diagnosis not present

## 2017-03-19 DIAGNOSIS — M6282 Rhabdomyolysis: Secondary | ICD-10-CM | POA: Diagnosis not present

## 2017-03-19 DIAGNOSIS — J189 Pneumonia, unspecified organism: Secondary | ICD-10-CM | POA: Diagnosis not present

## 2017-03-19 DIAGNOSIS — Z720 Tobacco use: Secondary | ICD-10-CM | POA: Diagnosis not present

## 2017-03-19 DIAGNOSIS — I1310 Hypertensive heart and chronic kidney disease without heart failure, with stage 1 through stage 4 chronic kidney disease, or unspecified chronic kidney disease: Secondary | ICD-10-CM | POA: Diagnosis not present

## 2017-03-19 DIAGNOSIS — J156 Pneumonia due to other aerobic Gram-negative bacteria: Secondary | ICD-10-CM | POA: Diagnosis not present

## 2017-03-19 DIAGNOSIS — E872 Acidosis: Secondary | ICD-10-CM | POA: Diagnosis not present

## 2017-03-20 DIAGNOSIS — J189 Pneumonia, unspecified organism: Secondary | ICD-10-CM | POA: Diagnosis not present

## 2017-03-20 DIAGNOSIS — N179 Acute kidney failure, unspecified: Secondary | ICD-10-CM | POA: Diagnosis not present

## 2017-03-20 DIAGNOSIS — T796XXA Traumatic ischemia of muscle, initial encounter: Secondary | ICD-10-CM | POA: Diagnosis not present

## 2017-03-20 DIAGNOSIS — I10 Essential (primary) hypertension: Secondary | ICD-10-CM | POA: Diagnosis not present

## 2017-03-20 DIAGNOSIS — G92 Toxic encephalopathy: Secondary | ICD-10-CM | POA: Diagnosis not present

## 2017-03-20 DIAGNOSIS — E875 Hyperkalemia: Secondary | ICD-10-CM | POA: Diagnosis not present

## 2017-03-20 DIAGNOSIS — N390 Urinary tract infection, site not specified: Secondary | ICD-10-CM | POA: Diagnosis not present

## 2017-03-20 DIAGNOSIS — Z720 Tobacco use: Secondary | ICD-10-CM | POA: Diagnosis not present

## 2017-03-20 DIAGNOSIS — E872 Acidosis: Secondary | ICD-10-CM | POA: Diagnosis not present

## 2017-03-20 DIAGNOSIS — R825 Elevated urine levels of drugs, medicaments and biological substances: Secondary | ICD-10-CM | POA: Diagnosis not present

## 2017-03-28 DIAGNOSIS — J189 Pneumonia, unspecified organism: Secondary | ICD-10-CM | POA: Diagnosis not present

## 2017-03-28 DIAGNOSIS — J029 Acute pharyngitis, unspecified: Secondary | ICD-10-CM | POA: Diagnosis not present

## 2017-03-31 DIAGNOSIS — J449 Chronic obstructive pulmonary disease, unspecified: Secondary | ICD-10-CM | POA: Diagnosis not present

## 2017-03-31 DIAGNOSIS — R946 Abnormal results of thyroid function studies: Secondary | ICD-10-CM | POA: Diagnosis not present

## 2017-03-31 DIAGNOSIS — G92 Toxic encephalopathy: Secondary | ICD-10-CM | POA: Diagnosis not present

## 2017-03-31 DIAGNOSIS — J156 Pneumonia due to other aerobic Gram-negative bacteria: Secondary | ICD-10-CM | POA: Diagnosis not present

## 2017-05-12 DIAGNOSIS — L03119 Cellulitis of unspecified part of limb: Secondary | ICD-10-CM | POA: Diagnosis not present

## 2017-06-02 DIAGNOSIS — H02839 Dermatochalasis of unspecified eye, unspecified eyelid: Secondary | ICD-10-CM | POA: Diagnosis not present

## 2017-06-02 DIAGNOSIS — H35033 Hypertensive retinopathy, bilateral: Secondary | ICD-10-CM | POA: Diagnosis not present

## 2017-06-02 DIAGNOSIS — H524 Presbyopia: Secondary | ICD-10-CM | POA: Diagnosis not present

## 2017-06-08 DIAGNOSIS — M961 Postlaminectomy syndrome, not elsewhere classified: Secondary | ICD-10-CM | POA: Diagnosis not present

## 2017-06-08 DIAGNOSIS — M5414 Radiculopathy, thoracic region: Secondary | ICD-10-CM | POA: Diagnosis not present

## 2017-06-08 DIAGNOSIS — M5416 Radiculopathy, lumbar region: Secondary | ICD-10-CM | POA: Diagnosis not present

## 2017-06-08 DIAGNOSIS — Z5181 Encounter for therapeutic drug level monitoring: Secondary | ICD-10-CM | POA: Diagnosis not present

## 2017-06-08 DIAGNOSIS — Z79899 Other long term (current) drug therapy: Secondary | ICD-10-CM | POA: Diagnosis not present

## 2017-06-08 DIAGNOSIS — I10 Essential (primary) hypertension: Secondary | ICD-10-CM | POA: Diagnosis not present

## 2017-06-08 DIAGNOSIS — G894 Chronic pain syndrome: Secondary | ICD-10-CM | POA: Diagnosis not present

## 2017-06-08 DIAGNOSIS — Z79891 Long term (current) use of opiate analgesic: Secondary | ICD-10-CM | POA: Diagnosis not present

## 2017-07-07 DIAGNOSIS — K219 Gastro-esophageal reflux disease without esophagitis: Secondary | ICD-10-CM | POA: Diagnosis not present

## 2017-07-07 DIAGNOSIS — E119 Type 2 diabetes mellitus without complications: Secondary | ICD-10-CM | POA: Diagnosis not present

## 2017-07-07 DIAGNOSIS — Z79899 Other long term (current) drug therapy: Secondary | ICD-10-CM | POA: Diagnosis not present

## 2017-07-07 DIAGNOSIS — I10 Essential (primary) hypertension: Secondary | ICD-10-CM | POA: Diagnosis not present

## 2017-07-07 DIAGNOSIS — G2581 Restless legs syndrome: Secondary | ICD-10-CM | POA: Diagnosis not present

## 2017-08-09 ENCOUNTER — Emergency Department (HOSPITAL_COMMUNITY)
Admission: EM | Admit: 2017-08-09 | Discharge: 2017-08-09 | Disposition: A | Discharge status: Home / Self Care | Payer: Medicare Other | Attending: Emergency Medicine | Admitting: Emergency Medicine

## 2017-08-09 ENCOUNTER — Encounter (HOSPITAL_COMMUNITY): Payer: Self-pay | Admitting: Emergency Medicine

## 2017-08-09 ENCOUNTER — Emergency Department (HOSPITAL_COMMUNITY): Payer: Medicare Other

## 2017-08-09 DIAGNOSIS — M542 Cervicalgia: Secondary | ICD-10-CM | POA: Insufficient documentation

## 2017-08-09 DIAGNOSIS — I10 Essential (primary) hypertension: Secondary | ICD-10-CM | POA: Insufficient documentation

## 2017-08-09 DIAGNOSIS — S199XXA Unspecified injury of neck, initial encounter: Secondary | ICD-10-CM | POA: Diagnosis not present

## 2017-08-09 DIAGNOSIS — E119 Type 2 diabetes mellitus without complications: Secondary | ICD-10-CM | POA: Diagnosis not present

## 2017-08-09 DIAGNOSIS — M545 Low back pain, unspecified: Secondary | ICD-10-CM

## 2017-08-09 DIAGNOSIS — R079 Chest pain, unspecified: Secondary | ICD-10-CM | POA: Diagnosis not present

## 2017-08-09 DIAGNOSIS — F17210 Nicotine dependence, cigarettes, uncomplicated: Secondary | ICD-10-CM | POA: Diagnosis not present

## 2017-08-09 DIAGNOSIS — J449 Chronic obstructive pulmonary disease, unspecified: Secondary | ICD-10-CM | POA: Diagnosis not present

## 2017-08-09 DIAGNOSIS — Z041 Encounter for examination and observation following transport accident: Secondary | ICD-10-CM | POA: Diagnosis present

## 2017-08-09 DIAGNOSIS — Z79899 Other long term (current) drug therapy: Secondary | ICD-10-CM | POA: Diagnosis not present

## 2017-08-09 DIAGNOSIS — S299XXA Unspecified injury of thorax, initial encounter: Secondary | ICD-10-CM | POA: Diagnosis not present

## 2017-08-09 MED ORDER — METHOCARBAMOL 500 MG PO TABS: 500.0000 mg | ORAL_TABLET | Freq: Four times a day (QID) | ORAL | 0 refills | Status: DC
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
## 2017-08-12 ENCOUNTER — Other Ambulatory Visit: Payer: Self-pay | Admitting: Nurse Practitioner

## 2017-08-12 DIAGNOSIS — Z1231 Encounter for screening mammogram for malignant neoplasm of breast: Secondary | ICD-10-CM

## 2017-09-07 DIAGNOSIS — Z23 Encounter for immunization: Secondary | ICD-10-CM | POA: Diagnosis not present

## 2017-09-23 ENCOUNTER — Ambulatory Visit: Payer: Medicare Other

## 2017-10-07 ENCOUNTER — Ambulatory Visit: Payer: Medicare Other

## 2017-10-13 DIAGNOSIS — J449 Chronic obstructive pulmonary disease, unspecified: Secondary | ICD-10-CM | POA: Diagnosis not present

## 2017-10-13 DIAGNOSIS — G2581 Restless legs syndrome: Secondary | ICD-10-CM | POA: Diagnosis not present

## 2017-10-13 DIAGNOSIS — E039 Hypothyroidism, unspecified: Secondary | ICD-10-CM | POA: Diagnosis not present

## 2017-10-13 DIAGNOSIS — N183 Chronic kidney disease, stage 3 (moderate): Secondary | ICD-10-CM | POA: Diagnosis not present

## 2017-10-13 DIAGNOSIS — I10 Essential (primary) hypertension: Secondary | ICD-10-CM | POA: Diagnosis not present

## 2017-10-13 DIAGNOSIS — N184 Chronic kidney disease, stage 4 (severe): Secondary | ICD-10-CM | POA: Diagnosis not present

## 2017-10-13 DIAGNOSIS — E782 Mixed hyperlipidemia: Secondary | ICD-10-CM | POA: Diagnosis not present

## 2017-10-13 DIAGNOSIS — E119 Type 2 diabetes mellitus without complications: Secondary | ICD-10-CM | POA: Diagnosis not present

## 2017-10-17 DIAGNOSIS — Z0000 Encounter for general adult medical examination without abnormal findings: Secondary | ICD-10-CM | POA: Diagnosis not present

## 2017-10-17 DIAGNOSIS — Z23 Encounter for immunization: Secondary | ICD-10-CM | POA: Diagnosis not present

## 2017-10-17 DIAGNOSIS — E785 Hyperlipidemia, unspecified: Secondary | ICD-10-CM | POA: Diagnosis not present

## 2017-10-17 DIAGNOSIS — Z136 Encounter for screening for cardiovascular disorders: Secondary | ICD-10-CM | POA: Diagnosis not present

## 2017-10-28 ENCOUNTER — Ambulatory Visit: Payer: Medicare Other

## 2017-11-25 ENCOUNTER — Ambulatory Visit: Payer: Medicare Other

## 2017-11-28 ENCOUNTER — Ambulatory Visit
Admission: RE | Admit: 2017-11-28 | Discharge: 2017-11-28 | Disposition: A | Discharge status: Home / Self Care | Payer: Medicare Other | Source: Ambulatory Visit | Attending: Nurse Practitioner | Admitting: Nurse Practitioner

## 2017-11-28 DIAGNOSIS — Z1231 Encounter for screening mammogram for malignant neoplasm of breast: Secondary | ICD-10-CM

## 2018-02-24 DIAGNOSIS — E039 Hypothyroidism, unspecified: Secondary | ICD-10-CM | POA: Diagnosis not present

## 2018-02-24 DIAGNOSIS — E782 Mixed hyperlipidemia: Secondary | ICD-10-CM | POA: Diagnosis not present

## 2018-02-24 DIAGNOSIS — E119 Type 2 diabetes mellitus without complications: Secondary | ICD-10-CM | POA: Diagnosis not present

## 2018-02-24 DIAGNOSIS — Z1331 Encounter for screening for depression: Secondary | ICD-10-CM | POA: Diagnosis not present

## 2018-02-24 DIAGNOSIS — I10 Essential (primary) hypertension: Secondary | ICD-10-CM | POA: Diagnosis not present

## 2018-05-26 DIAGNOSIS — J019 Acute sinusitis, unspecified: Secondary | ICD-10-CM | POA: Diagnosis not present

## 2018-06-13 DIAGNOSIS — M545 Low back pain: Secondary | ICD-10-CM | POA: Diagnosis not present

## 2018-06-13 DIAGNOSIS — M1711 Unilateral primary osteoarthritis, right knee: Secondary | ICD-10-CM | POA: Diagnosis not present

## 2018-06-21 ENCOUNTER — Ambulatory Visit: Payer: Medicare Other | Admitting: Student in an Organized Health Care Education/Training Program

## 2018-07-06 DIAGNOSIS — J9600 Acute respiratory failure, unspecified whether with hypoxia or hypercapnia: Secondary | ICD-10-CM | POA: Diagnosis not present

## 2018-07-06 DIAGNOSIS — J449 Chronic obstructive pulmonary disease, unspecified: Secondary | ICD-10-CM | POA: Diagnosis not present

## 2018-07-06 DIAGNOSIS — R0902 Hypoxemia: Secondary | ICD-10-CM | POA: Diagnosis not present

## 2018-07-06 DIAGNOSIS — I5030 Unspecified diastolic (congestive) heart failure: Secondary | ICD-10-CM | POA: Diagnosis not present

## 2018-07-07 DIAGNOSIS — I5030 Unspecified diastolic (congestive) heart failure: Secondary | ICD-10-CM | POA: Diagnosis not present

## 2018-07-07 DIAGNOSIS — I2510 Atherosclerotic heart disease of native coronary artery without angina pectoris: Secondary | ICD-10-CM | POA: Diagnosis not present

## 2018-07-07 DIAGNOSIS — J441 Chronic obstructive pulmonary disease with (acute) exacerbation: Secondary | ICD-10-CM | POA: Diagnosis not present

## 2018-07-07 DIAGNOSIS — J449 Chronic obstructive pulmonary disease, unspecified: Secondary | ICD-10-CM | POA: Diagnosis not present

## 2018-07-07 DIAGNOSIS — J9600 Acute respiratory failure, unspecified whether with hypoxia or hypercapnia: Secondary | ICD-10-CM | POA: Diagnosis not present

## 2018-07-13 DIAGNOSIS — I2510 Atherosclerotic heart disease of native coronary artery without angina pectoris: Secondary | ICD-10-CM | POA: Diagnosis not present

## 2018-07-13 DIAGNOSIS — I5030 Unspecified diastolic (congestive) heart failure: Secondary | ICD-10-CM | POA: Diagnosis not present

## 2018-07-13 DIAGNOSIS — J449 Chronic obstructive pulmonary disease, unspecified: Secondary | ICD-10-CM | POA: Diagnosis not present

## 2018-07-13 DIAGNOSIS — J9600 Acute respiratory failure, unspecified whether with hypoxia or hypercapnia: Secondary | ICD-10-CM | POA: Diagnosis not present

## 2018-07-14 ENCOUNTER — Other Ambulatory Visit: Payer: Self-pay | Admitting: Family Medicine

## 2018-07-14 ENCOUNTER — Ambulatory Visit
Admission: RE | Admit: 2018-07-14 | Discharge: 2018-07-14 | Disposition: A | Discharge status: Home / Self Care | Payer: Medicare Other | Source: Ambulatory Visit | Attending: Family Medicine | Admitting: Family Medicine

## 2018-07-14 DIAGNOSIS — R05 Cough: Secondary | ICD-10-CM

## 2018-07-14 DIAGNOSIS — R059 Cough, unspecified: Secondary | ICD-10-CM

## 2018-07-20 ENCOUNTER — Encounter: Payer: Self-pay | Admitting: Student in an Organized Health Care Education/Training Program

## 2018-07-20 ENCOUNTER — Other Ambulatory Visit: Payer: Self-pay

## 2018-07-20 ENCOUNTER — Ambulatory Visit
Payer: Medicare Other | Attending: Student in an Organized Health Care Education/Training Program | Admitting: Student in an Organized Health Care Education/Training Program

## 2018-07-20 VITALS — BP 131/87 | HR 86 | Temp 99.1°F | Resp 16 | Ht 67.0 in | Wt 220.0 lb

## 2018-07-20 DIAGNOSIS — G2581 Restless legs syndrome: Secondary | ICD-10-CM | POA: Insufficient documentation

## 2018-07-20 DIAGNOSIS — M961 Postlaminectomy syndrome, not elsewhere classified: Secondary | ICD-10-CM | POA: Diagnosis not present

## 2018-07-20 DIAGNOSIS — M4326 Fusion of spine, lumbar region: Secondary | ICD-10-CM

## 2018-07-20 DIAGNOSIS — M47816 Spondylosis without myelopathy or radiculopathy, lumbar region: Secondary | ICD-10-CM

## 2018-07-20 DIAGNOSIS — E119 Type 2 diabetes mellitus without complications: Secondary | ICD-10-CM | POA: Insufficient documentation

## 2018-07-20 DIAGNOSIS — M5136 Other intervertebral disc degeneration, lumbar region: Secondary | ICD-10-CM

## 2018-07-20 DIAGNOSIS — I5032 Chronic diastolic (congestive) heart failure: Secondary | ICD-10-CM | POA: Diagnosis not present

## 2018-07-20 DIAGNOSIS — J449 Chronic obstructive pulmonary disease, unspecified: Secondary | ICD-10-CM | POA: Diagnosis not present

## 2018-07-20 DIAGNOSIS — E785 Hyperlipidemia, unspecified: Secondary | ICD-10-CM | POA: Insufficient documentation

## 2018-07-20 DIAGNOSIS — G40109 Localization-related (focal) (partial) symptomatic epilepsy and epileptic syndromes with simple partial seizures, not intractable, without status epilepticus: Secondary | ICD-10-CM

## 2018-07-20 DIAGNOSIS — G894 Chronic pain syndrome: Secondary | ICD-10-CM

## 2018-07-20 DIAGNOSIS — M1712 Unilateral primary osteoarthritis, left knee: Secondary | ICD-10-CM | POA: Diagnosis not present

## 2018-07-20 DIAGNOSIS — I110 Hypertensive heart disease with heart failure: Secondary | ICD-10-CM | POA: Diagnosis not present

## 2018-07-20 DIAGNOSIS — Z96651 Presence of right artificial knee joint: Secondary | ICD-10-CM | POA: Diagnosis not present

## 2018-07-20 DIAGNOSIS — M549 Dorsalgia, unspecified: Secondary | ICD-10-CM | POA: Insufficient documentation

## 2018-07-20 DIAGNOSIS — M7918 Myalgia, other site: Secondary | ICD-10-CM

## 2018-07-20 DIAGNOSIS — Z981 Arthrodesis status: Secondary | ICD-10-CM | POA: Insufficient documentation

## 2018-07-20 DIAGNOSIS — G40209 Localization-related (focal) (partial) symptomatic epilepsy and epileptic syndromes with complex partial seizures, not intractable, without status epilepticus: Secondary | ICD-10-CM

## 2018-07-20 DIAGNOSIS — J441 Chronic obstructive pulmonary disease with (acute) exacerbation: Secondary | ICD-10-CM

## 2018-07-20 DIAGNOSIS — R569 Unspecified convulsions: Secondary | ICD-10-CM

## 2018-07-20 NOTE — Progress Notes (Signed)
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  Patient does have bilateral S1 screw fractures which her surgeon is aware of and does not need any surgical intervention according to his evaluation.  Patient's current medications include gabapentin 800 mg 3 times a day, nortriptyline 75 mg nightly.  Patient also has a history of depression and anxiety and she is on BuSpar, Abilify.  She denies any bowel or bladder dysfunction.  Patient ambulance with a cane.  She has stopped her Xanax and has not taken it for over 1 year.  She did have one urine drug screen that did not show any oxycodone that she was being prescribed however upon further discussion, patient states that she had just run out of  her medications before the clinic visit and that is why her UDS was not positive for oxycodone.  Patient has not been on any opioid therapy since 05/19/2017.  She states that her quality of life is worse and that her functional status is worse since not being on opioid medications.  Regards to therapeutic options, the patient does have pain with palpation of her lumbar paraspinal muscles.  This could be secondary to myofascial pain syndrome and musculoskeletal pain.  We discussed deep lumbar paraspinal trigger point injections.  Risks and benefits were discussed and patient would like to proceed.  In regards to medication management, I would like to avoid opioid medications in this patient who is moderately obese and has a history of COPD as well as psychiatric disease.  Trigger point injections are not effective, can consider tramadol or buprenorphine transdermal patch.  Patient's prior opioid regimen consisted of oxycodone 15 mg 4 times a day with her last fill of that on June 2018.  Parentheses MME equals 90.  Patient at that time was also on  Xanax.  She is currently not on any of these medications.  She is only taking gabapentin 800 mg 3 times a day and nortriptyline along with her psychotropic medications.  Plan: -UDS today -Trigger point injections of lumbar paraspinal region -Consideration of buprenorphine transdermal patch versus tramadol -Continue gabapentin 800 mg 3 times daily, nortriptyline 75 mill grams nightly -Continue all psychiatric medications for depression and anxiety as prescribed.  Plan of Care (Initial workup plan)  Note: Please be advised that as per protocol, today's visit has been an evaluation only. We have not taken over the patient's controlled substance management.   Ordered Lab-work, Procedure(s), Referral(s), & Consult(s): Orders Placed This Encounter  Procedures  . TRIGGER POINT INJECTION  . Compliance Drug Analysis, Ur   Pharmacotherapy (current): Medications ordered:  No orders of the defined types were placed in this encounter.  Medications administered during this visit: Gustavus Messing had no medications administered during this visit.   Pharmacological management options:  Opioid Analgesics: The patient was informed that there is no guarantee that she would be a candidate for opioid analgesics. The decision will be made following CDC guidelines. This decision will be based on the results of diagnostic studies, as well as Ms. Seybold's risk profile.   Membrane stabilizer: To be determined at a later time  Muscle relaxant: To be determined at a later time  NSAID: To be determined at a later time  Other analgesic(s): To be determined at a later time   Interventional management options: Ms. Alcantar was informed that there is no guarantee that she would be a candidate for interventional therapies. The decision will be based on the results of diagnostic studies, as well as Ms. Hundal's risk profile.  Procedure(s) under consideration:  Trigger point injections  Caudal ESI    Provider-requested follow-up: Return in about 1 week (around 07/27/2018) for Procedure.  Future Appointments  Date Time Provider [place]  07/31/2018  9:00 AM Minus Breeding, MD [facility]  08/02/2018 11:45 AM Gillis Santa, MD [address] [facility] None    Primary Care Physician: Practice, [facility] Family Location: [facility] Note by: Gillis Santa, M.D, Date: 07/20/2018; Time: 12:59 PM  There are no Patient Instructions on file for this visit.

## 2018-07-20 NOTE — Progress Notes (Signed)
Safety precautions to be maintained throughout the outpatient stay will include: orient to surroundings, keep bed in low position, maintain call bell within reach at all times, provide assistance with transfer out of bed and ambulation.  

## 2018-07-26 LAB — COMPLIANCE DRUG ANALYSIS, UR

## 2018-07-29 ENCOUNTER — Encounter: Payer: Self-pay | Admitting: Cardiology

## 2018-07-29 NOTE — Progress Notes (Signed)
Cardiology Office Note   Date:  07/31/2018   ID:  [name], DOB July 01, 1956, MRN [id number]  PCP:  Cyndi Bender, PA-C  Cardiologist:   No primary care provider on file. Referring:  Cyndi Bender, PA-C  Chief Complaint  Patient presents with  . Shortness of Breath      History of Present Illness: [name] is a 62 y.o. female who presents for follow up of acute respiratory failure.  She is referred for shortness of breath.  She apparently has COPD but says that her breathing is gotten worse over the last few months.  She was put on oxygen at night.  She says her oxygen saturations fall into the low 80s without the oxygen.  She was found to have a slightly elevated BNP of 284.  She was given Lasix but does not think this is helped with her shortness of breath.  She was having some lower extremity edema and this is improved.  She says she had about 8 pound weight gain in a month.  She is not describing PND or orthopnea.  She has been having a chronic cough but not with sputum production.  She has not been having fevers or chills.  She has a constant chest pressure.  This is not changed recently and is not worse with activity.  She is limited in her activities by leg and back pain and she walks with a cane.  She can vacuum in her house.  She can do grocery shopping and bring those in but she has to take breaks.  Of note her husband died suddenly in 2023-06-19 probably of a heart attack.  She did have a negative stress test in 2012.    In 2014 an echo was unremarkable with a normal EF.      Past Medical History:  Diagnosis Date  . Anxiety   . Arthritis   . COPD (chronic obstructive pulmonary disease) (HCC)    no inhalers--smoker, no oxygen  . Depression   . Diabetes mellitus    no meds-diet controlled  . GERD (gastroesophageal reflux disease)   . Hyperlipidemia   . Hypertension   . Hypothyroid   . Restless leg syndrome   . 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  no seizure since--pt does take keppra and office note from neurologist dr. Leta Baptist on this chart    Past Surgical History:  Procedure Laterality Date  . CARPAL TUNNEL RELEASE     left  . JOINT REPLACEMENT  08/26/2011    right total knee arthroplasty  . KNEE ARTHROPLASTY  10/28/2011   Procedure: COMPUTER ASSISTED TOTAL KNEE ARTHROPLASTY;  Surgeon: Karen Chafe Rendall III;  Location: [facility];  Service: Orthopedics;  Laterality: Left;  preop femoral nerve block  . KNEE SURGERY     right  . laminectomy and diskectomy     L4-5 with fusion     Current Outpatient Medications  Medication Sig Dispense Refill  . albuterol (PROVENTIL HFA;VENTOLIN HFA) 108 (90 BASE) MCG/ACT inhaler Inhale 2 puffs into the lungs every 6 (six) hours as needed. For shortness of breath/wheezing.    Marland Kitchen amLODipine (NORVASC) 5 MG tablet Take 5 mg by mouth daily.     . ARIPiprazole (ABILIFY) 10 MG tablet Take 10 mg by mouth daily.     Marland Kitchen  ASPIRIN 81 PO Take by mouth daily.    . beta carotene 25000 UNIT capsule Take 25,000 Units by mouth daily.    . busPIRone (BUSPAR) 30 MG tablet Take 30 mg by mouth 2 (two) times daily.    Marland Kitchen escitalopram (LEXAPRO) 20 MG tablet Take 20 mg by mouth every morning.     . fenofibrate micronized (LOFIBRA) 134 MG capsule Take 134 mg by mouth daily before breakfast.     . furosemide (LASIX) 40 MG tablet Take 40 mg by mouth daily as needed for fluid or edema.     . gabapentin (NEURONTIN) 800 MG tablet Take 800 mg by mouth 3 (three) times daily.     . hydrOXYzine (ATARAX/VISTARIL) 25 MG tablet Take 25 mg by mouth every 6 (six) hours as needed for anxiety or itching.     Marland Kitchen ibuprofen (ADVIL,MOTRIN) 200 MG tablet Take 200 mg by mouth every 6 (six) hours as needed for moderate pain.    Marland Kitchen levETIRAcetam (KEPPRA) 500 MG tablet Take 1 tablet (500 mg total) by mouth 2  (two) times daily. 60 tablet 0  . levothyroxine (SYNTHROID, LEVOTHROID) 25 MCG tablet Take 25 mcg by mouth daily before breakfast.     . losartan (COZAAR) 100 MG tablet Take 100 mg by mouth daily.     . nortriptyline (PAMELOR) 75 MG capsule Take 75 mg by mouth at bedtime.     . pantoprazole (PROTONIX) 40 MG tablet Take 40 mg by mouth every morning.     . ranitidine (ZANTAC) 150 MG tablet Take 150 mg by mouth 2 (two) times daily.     . methocarbamol (ROBAXIN) 500 MG tablet Take 1 tablet (500 mg total) by mouth 4 (four) times daily. (Patient not taking: Reported on 07/20/2018) 20 tablet 0   No current facility-administered medications for this visit.    Facility-Administered Medications Ordered in Other Visits  Medication Dose Route Frequency Provider Last Rate Last Dose  . chlorhexidine (HIBICLENS) 4 % liquid 4 application  60 mL Topical Once Duffy, Kellie Shropshire, PA-C      . chlorhexidine (HIBICLENS) 4 % liquid 4 application  60 mL Topical Once Duffy, Kellie Shropshire, PA-C        Allergies:   Acetaminophen; Aspirin; Nsaids; and Vicodin [hydrocodone-acetaminophen]    Social History:  The patient  reports that she has been smoking cigarettes. She has a 40.00 pack-year smoking history. She has never used smokeless tobacco. She reports that she drinks alcohol. She reports that she does not use drugs.   Family History:  The patient's family history includes Alzheimer's disease in her mother; Heart attack (age of onset: 42) in her father.    ROS:  Please see the history of present illness.   Otherwise, review of systems are positive for none.   All other systems are reviewed and negative.    PHYSICAL EXAM: VS:  BP 130/82 (BP Location: Left Arm, Patient Position: Sitting)   Pulse 86   Ht 5\' 7"  (1.702 m)   Wt 228 lb (103.4 kg)   BMI 35.71 kg/m  , BMI Body mass index is 35.71 kg/m. GENERAL:  Well appearing HEENT:  Pupils equal round and reactive, fundi not visualized, oral mucosa unremarkable NECK:  No  jugular venous distention, waveform within normal limits, carotid upstroke brisk and symmetric, no bruits, no thyromegaly LYMPHATICS:  No cervical, inguinal adenopathy LUNGS:  Clear to auscultation bilaterally BACK:  No CVA tenderness CHEST:  Unremarkable HEART:  PMI not displaced or sustained,S1 and S2  within normal limits, no S3, no S4, no clicks, no rubs, no murmurs ABD:  Flat, positive bowel sounds normal in frequency in pitch, no bruits, no rebound, no guarding, no midline pulsatile mass, no hepatomegaly, no splenomegaly EXT:  2 plus pulses throughout, mild edema, no cyanosis no clubbing, chronic venous stasis changes.  SKIN:  No rashes no nodules NEURO:  Cranial nerves II through XII grossly intact, motor grossly intact throughout PSYCH:  Cognitively intact, oriented to person place and time    EKG:  EKG is ordered today. The ekg ordered today demonstrates sinus rhythm, rate 86, axis within normal limits, intervals within normal limits, no acute ST-T wave changes.   Recent Labs: No results found for requested labs within last 8760 hours.    Lipid Panel    Component Value Date/Time   CHOL  03/13/2010 0214    98        ATP III CLASSIFICATION:  <200     mg/dL   Desirable  200-239  mg/dL   Borderline High  >=240    mg/dL   High          TRIG 66 03/13/2010 0214   HDL 24 (L) 03/13/2010 0214   CHOLHDL 4.1 03/13/2010 0214   VLDL 13 03/13/2010 0214   LDLCALC  03/13/2010 0214    61        Total Cholesterol/HDL:CHD Risk Coronary Heart Disease Risk Table                     Men   Women  1/2 Average Risk   3.4   3.3  Average Risk       5.0   4.4  2 X Average Risk   9.6   7.1  3 X Average Risk  23.4   11.0        Use the calculated Patient Ratio above and the CHD Risk Table to determine the patient's CHD Risk.        ATP III CLASSIFICATION (LDL):  <100     mg/dL   Optimal  100-129  mg/dL   Near or Above                    Optimal  130-159  mg/dL   Borderline  160-189   mg/dL   High  >190     mg/dL   Very High      Wt Readings from Last 3 Encounters:  07/31/18 228 lb (103.4 kg)  07/20/18 220 lb (99.8 kg)  08/09/17 200 lb (90.7 kg)      Other studies Reviewed: Additional studies/ records that were reviewed today include: Office records. Review of the above records demonstrates:  Please see elsewhere in the note.     ASSESSMENT AND PLAN:  DOE: This is most likely related to her COPD and ongoing smoking.  However, she does have a mildly elevated BNP.  She has significant cardiovascular risk factors.  Would start with echocardiogram.  She also had screening with a stress test but she would be able to walk on a treadmill.  Therefore, she will have a [employer].  HTN:  The blood pressure is at target. No change in medications is indicated. We will continue with therapeutic lifestyle changes (TLC).  TOBACCO: This is a long-standing chronic problem and we talked about this.  She will try quitting and will start with attempts at cold [place].  Current medicines are reviewed at length with the patient today.  The patient does not have concerns regarding medicines.  The following changes have been made:  no change  Labs/ tests ordered today include:   Orders Placed This Encounter  Procedures  . MYOCARDIAL PERFUSION IMAGING  . ECHOCARDIOGRAM COMPLETE     Disposition:   FU with me as needed based on the results of the above.     Signed, Minus Breeding, MD  07/31/2018 9:58 AM    [facility]

## 2018-07-31 ENCOUNTER — Ambulatory Visit (INDEPENDENT_AMBULATORY_CARE_PROVIDER_SITE_OTHER): Payer: Medicare Other | Admitting: Cardiology

## 2018-07-31 ENCOUNTER — Encounter: Payer: Self-pay | Admitting: Cardiology

## 2018-07-31 VITALS — BP 130/82 | HR 86 | Ht 67.0 in | Wt 228.0 lb

## 2018-07-31 DIAGNOSIS — M7989 Other specified soft tissue disorders: Secondary | ICD-10-CM | POA: Diagnosis not present

## 2018-07-31 DIAGNOSIS — R0602 Shortness of breath: Secondary | ICD-10-CM

## 2018-07-31 DIAGNOSIS — Z720 Tobacco use: Secondary | ICD-10-CM

## 2018-07-31 NOTE — Addendum Note (Signed)
Addended by: Caprice Beaver T on: 07/31/2018 10:31 AM   Modules accepted: Orders

## 2018-07-31 NOTE — Patient Instructions (Signed)
Medication Instructions:  Continue current medications  If you need a refill on your cardiac medications before your next appointment, please call your pharmacy.  Labwork: None Ordered   Testing/Procedures: Your physician has requested that you have an echocardiogram. Echocardiography is a painless test that uses sound waves to create images of your heart. It provides your doctor with information about the size and shape of your heart and how well your heart's chambers and valves are working. This procedure takes approximately one hour. There are no restrictions for this procedure.  Your physician has requested that you have a lexiscan myoview. For further information please visit [email or web address]. Please follow instruction sheet, as given.   Follow-Up: Your physician wants you to follow-up in: As Needed.      Thank you for choosing [facility] at [facility]!!

## 2018-08-01 NOTE — Addendum Note (Signed)
Addended by: Caprice Beaver T on: 08/01/2018 08:53 AM   Modules accepted: Orders

## 2018-08-02 ENCOUNTER — Ambulatory Visit
Payer: Medicare Other | Attending: Student in an Organized Health Care Education/Training Program | Admitting: Student in an Organized Health Care Education/Training Program

## 2018-08-02 ENCOUNTER — Other Ambulatory Visit: Payer: Self-pay

## 2018-08-02 ENCOUNTER — Encounter: Payer: Self-pay | Admitting: Student in an Organized Health Care Education/Training Program

## 2018-08-02 VITALS — BP 138/82 | HR 87 | Temp 99.1°F | Resp 18 | Ht 67.0 in | Wt 220.0 lb

## 2018-08-02 DIAGNOSIS — Z96659 Presence of unspecified artificial knee joint: Secondary | ICD-10-CM | POA: Insufficient documentation

## 2018-08-02 DIAGNOSIS — G894 Chronic pain syndrome: Secondary | ICD-10-CM

## 2018-08-02 DIAGNOSIS — Z885 Allergy status to narcotic agent status: Secondary | ICD-10-CM | POA: Insufficient documentation

## 2018-08-02 DIAGNOSIS — Z79890 Hormone replacement therapy (postmenopausal): Secondary | ICD-10-CM | POA: Diagnosis not present

## 2018-08-02 DIAGNOSIS — Z79899 Other long term (current) drug therapy: Secondary | ICD-10-CM | POA: Diagnosis not present

## 2018-08-02 DIAGNOSIS — Z886 Allergy status to analgesic agent status: Secondary | ICD-10-CM | POA: Insufficient documentation

## 2018-08-02 DIAGNOSIS — Z7982 Long term (current) use of aspirin: Secondary | ICD-10-CM | POA: Insufficient documentation

## 2018-08-02 DIAGNOSIS — M7918 Myalgia, other site: Secondary | ICD-10-CM | POA: Insufficient documentation

## 2018-08-02 DIAGNOSIS — M549 Dorsalgia, unspecified: Secondary | ICD-10-CM | POA: Diagnosis present

## 2018-08-02 MED ORDER — ROPIVACAINE HCL 2 MG/ML IJ SOLN
10.0000 mL | Freq: Once | INTRAMUSCULAR | Status: AC
  Administered 2018-08-02: 10 mL

## 2018-08-02 NOTE — Progress Notes (Signed)
Patient's Name: [name]  MRN: [id number]  Referring Provider: [facility]  DOB: 1956-01-04  PCP: Cyndi Bender, PA-C  DOS: 08/02/2018  Note by: Gillis Santa, MD  Service setting: Ambulatory outpatient  Specialty: Interventional Pain Management  Patient type: Established  Location: [facility] (AMB) Pain Management Facility  Visit type: Interventional Procedure   Primary Reason for Visit: Interventional Pain Management Treatment. CC: Back Pain (low and upper)  Procedure:          Anesthesia, Analgesia, Anxiolysis:  Type: Trigger Point Injection (3+ muscle groups) CPT: 20553 Primary Purpose: Diagnostic Region: Posterolateral Lumbosacral Level: Lumbosacral Target Area: Trigger Point Approach: Percutaneous, ipsilateral approach. Laterality: Bilateral       Position: Prone  Type: Local Anesthesia Indication(s): Analgesia         Local Anesthetic: Lidocaine 1-2% Route: Infiltration (Keyes/IM) IV Access: Declined Sedation: Declined    Indications: 1. Myofascial pain syndrome   2. Chronic pain syndrome    Pain Score: Pre-procedure: 3 /10 Post-procedure: 2 /10  Pre-op Assessment:  [name] is a 62 y.o. (year old), female patient, seen today for interventional treatment. She  has a past surgical history that includes laminectomy and diskectomy; Knee surgery; Carpal tunnel release; Joint replacement (08/26/2011 ); and Knee Arthroplasty (10/28/2011). [name] has a current medication list which includes the following prescription(s): albuterol, amlodipine, aripiprazole, aspirin, beta carotene, buspirone, escitalopram, fenofibrate micronized, furosemide, gabapentin, hydroxyzine, ibuprofen, levetiracetam, levothyroxine, losartan, methocarbamol, nortriptyline, pantoprazole, and ranitidine, and the following Facility-Administered Medications: chlorhexidine and chlorhexidine. Her primarily concern today is the Back Pain (low and upper)  Initial Vital Signs:  Pulse/HCG Rate: 90  Temp:  99.1 F (37.3 C) Resp: 18 BP: 136/81 SpO2: 96 %  BMI: Estimated body mass index is 34.46 kg/m as calculated from the following:   Height as of this encounter: 5\' 7"  (1.702 m).   Weight as of this encounter: 220 lb (99.8 kg).  Risk Assessment: Allergies: Reviewed. She is allergic to acetaminophen; aspirin; nsaids; and vicodin [hydrocodone-acetaminophen].  Allergy Precautions: None required Coagulopathies: Reviewed. None identified.  Blood-thinner therapy: None at this time Active Infection(s): Reviewed. None identified. [name] is afebrile  Site Confirmation: [name] was asked to confirm the procedure and laterality before marking the site Procedure checklist: Completed Consent: Before the procedure and under the influence of no sedative(s), amnesic(s), or anxiolytics, the patient was informed of the treatment options, risks and possible complications. To fulfill our ethical and legal obligations, as recommended by the American Medical Association's Code of Ethics, I have informed the patient of my clinical impression; the nature and purpose of the treatment or procedure; the risks, benefits, and possible complications of the intervention; the alternatives, including doing nothing; the risk(s) and benefit(s) of the alternative treatment(s) or procedure(s); and the risk(s) and benefit(s) of doing nothing. The patient was provided information about the general risks and possible complications associated with the procedure. These may include, but are not limited to: failure to achieve desired goals, infection, bleeding, organ or nerve damage, allergic reactions, paralysis, and death. In addition, the patient was informed of those risks and complications associated to the procedure, such as failure to decrease pain; infection; bleeding; organ or nerve damage with subsequent damage to sensory, motor, and/or autonomic systems, resulting in permanent pain, numbness, and/or weakness of one or  several areas of the body; allergic reactions; (i.e.: anaphylactic reaction); and/or death. Furthermore, the patient was informed of those risks and complications associated with the medications. These include, but are not  limited to: allergic reactions (i.e.: anaphylactic or anaphylactoid reaction(s)); adrenal axis suppression; blood sugar elevation that in diabetics may result in ketoacidosis or comma; water retention that in patients with history of congestive heart failure may result in shortness of breath, pulmonary edema, and decompensation with resultant heart failure; weight gain; swelling or edema; medication-induced neural toxicity; particulate matter embolism and blood vessel occlusion with resultant organ, and/or nervous system infarction; and/or aseptic necrosis of one or more joints. Finally, the patient was informed that Medicine is not an exact science; therefore, there is also the possibility of unforeseen or unpredictable risks and/or possible complications that may result in a catastrophic outcome. The patient indicated having understood very clearly. We have given the patient no guarantees and we have made no promises. Enough time was given to the patient to ask questions, all of which were answered to the patient's satisfaction. [name] has indicated that she wanted to continue with the procedure. Attestation: I, the ordering provider, attest that I have discussed with the patient the benefits, risks, side-effects, alternatives, likelihood of achieving goals, and potential problems during recovery for the procedure that I have provided informed consent. Date  Time: 08/02/2018 11:27 AM  Pre-Procedure Preparation:  Monitoring: As per clinic protocol. Respiration, ETCO2, SpO2, BP, heart rate and rhythm monitor placed and checked for adequate function Safety Precautions: Patient was assessed for positional comfort and pressure points before starting the procedure. Time-out: I initiated  and conducted the "Time-out" before starting the procedure, as per protocol. The patient was asked to participate by confirming the accuracy of the "Time Out" information. Verification of the correct person, site, and procedure were performed and confirmed by me, the nursing staff, and the patient. "Time-out" conducted as per Joint Commission's Universal Protocol (UP.01.01.01). Time: 1205  Description of Procedure:          Area Prepped: Entire Posterior Lumbosacral Region Prepping solution: ChloraPrep (2% chlorhexidine gluconate and 70% isopropyl alcohol) Safety Precautions: Aspiration looking for blood return was conducted prior to all injections. At no point did we inject any substances, as a needle was being advanced. No attempts were made at seeking any paresthesias. Safe injection practices and needle disposal techniques used. Medications properly checked for expiration dates. SDV (single dose vial) medications used. Description of the Procedure: Protocol guidelines were followed. The patient was placed in position over the fluoroscopy table. The target area was identified and the area prepped in the usual manner. Skin & deeper tissues infiltrated with local anesthetic. Appropriate amount of time allowed to pass for local anesthetics to take effect. The procedure needles were then advanced to the target area. Proper needle placement secured. Negative aspiration confirmed. Solution injected in intermittent fashion, asking for systemic symptoms every 0.5cc of injectate. The needles were then removed and the area cleansed, making sure to leave some of the prepping solution back to take advantage of its long term bactericidal properties.  Vitals:   08/02/18 1143 08/02/18 1214  BP: 136/81 138/82  Pulse: 90 87  Resp: 18 18  Temp: 99.1 F (37.3 C)   TempSrc: Oral   SpO2: 96% 95%  Weight: 220 lb (99.8 kg)   Height: 5\' 7"  (1.702 m)     Start Time: 1205 hrs. End Time: 1211 hrs. Materials:   Needle(s) Type: Epidural needle Gauge: 25G Length: 1.5-in Medication(s): Please see orders for medications and dosing details. Approximately 15 trigger point injections in lower lumbosacral region.  Each trigger point injected with 1 cc of 0.2% ropivacaine. Imaging  Guidance:          Type of Imaging Technique: None used Indication(s): N/A Exposure Time: No patient exposure Contrast: None used. Fluoroscopic Guidance: N/A Ultrasound Guidance: N/A Interpretation: N/A  Antibiotic Prophylaxis:   Anti-infectives (From admission, onward)   None     Indication(s): None identified  Post-operative Assessment:  Post-procedure Vital Signs:  Pulse/HCG Rate: 87  Temp: 99.1 F (37.3 C) Resp: 18 BP: 138/82 SpO2: 95 %  EBL: None  Complications: No immediate post-treatment complications observed by team, or reported by patient.  Note: The patient tolerated the entire procedure well. A repeat set of vitals were taken after the procedure and the patient was kept under observation following institutional policy, for this type of procedure. Post-procedural neurological assessment was performed, showing return to baseline, prior to discharge. The patient was provided with post-procedure discharge instructions, including a section on how to identify potential problems. Should any problems arise concerning this procedure, the patient was given instructions to immediately contact us, at any time, without hesitation. In any case, we plan to contact the patient by telephone for a follow-up status report regarding this interventional procedure.  Comments:  No additional relevant information.  Plan of Care   Imaging Orders  No imaging studies ordered today   Procedure Orders    No procedure(s) ordered today    Medications ordered for procedure: Meds ordered this encounter  Medications  . ropivacaine (PF) 2 mg/mL (0.2%) (NAROPIN) injection 10 mL  . ropivacaine (PF) 2 mg/mL (0.2%) (NAROPIN)  injection 10 mL   Medications administered: We administered ropivacaine (PF) 2 mg/mL (0.2%) and ropivacaine (PF) 2 mg/mL (0.2%).  See the medical record for exact dosing, route, and time of administration.  New Prescriptions   No medications on file   Disposition: Discharge home  Discharge Date & Time: 08/02/2018; 1225 hrs.   Physician-requested Follow-up: No follow-ups on file.  Future Appointments  Date Time Provider Angus  08/07/2018  2:00 PM [facility] 1 [id number]  08/08/2018 12:30 PM [facility]  08/23/2018 11:45 AM Gillis Santa, MD [facility] None   Primary Care Physician: Cyndi Bender, PA-C Location: [facility] Outpatient Pain Management Facility Note by: Gillis Santa, MD Date: 08/02/2018; Time: 12:27 PM  Disclaimer:  Medicine is not an exact science. The only guarantee in medicine is that nothing is guaranteed. It is important to note that the decision to proceed with this intervention was based on the information collected from the patient. The Data and conclusions were drawn from the patient's questionnaire, the interview, and the physical examination. Because the information was provided in large part by the patient, it cannot be guaranteed that it has not been purposely or unconsciously manipulated. Every effort has been made to obtain as much relevant data as possible for this evaluation. It is important to note that the conclusions that lead to this procedure are derived in large part from the available data. Always take into account that the treatment will also be dependent on availability of resources and existing treatment guidelines, considered by other Pain Management Practitioners as being common knowledge and practice, at the time of the intervention. For Medico-Legal purposes, it is also important to point out that variation in procedural techniques and pharmacological choices are the acceptable norm. The indications,  contraindications, technique, and results of the above procedure should only be interpreted and judged by a Board-Certified Interventional Pain Specialist with extensive familiarity and expertise in the same exact procedure and  technique.

## 2018-08-02 NOTE — Patient Instructions (Signed)
Pain Management Discharge Instructions  General Discharge Instructions :  If you need to reach your doctor call: Monday-Friday 8:00 am - 4:00 pm at [phone] or toll free [phone].  After clinic hours [phone] to have operator reach doctor.  Bring all of your medication bottles to all your appointments in the pain clinic.  To cancel or reschedule your appointment with Pain Management please remember to call 24 hours in advance to avoid a fee.  Refer to the educational materials which you have been given on: General Risks, I had my Procedure. Discharge Instructions, Post Sedation.  Post Procedure Instructions:  The drugs you were given will stay in your system until tomorrow, so for the next 24 hours you should not drive, make any legal decisions or drink any alcoholic beverages.  You may eat anything you prefer, but it is better to start with liquids then soups and crackers, and gradually work up to solid foods.  Please notify your doctor immediately if you have any unusual bleeding, trouble breathing or pain that is not related to your normal pain.  Depending on the type of procedure that was done, some parts of your body may feel week and/or numb.  This usually clears up by tonight or the next day.  Walk with the use of an assistive device or accompanied by an adult for the 24 hours.  You may use ice on the affected area for the first 24 hours.  Put ice in a Ziploc bag and cover with a towel and place against area 15 minutes on 15 minutes off.  You may switch to heat after 24 hours.Trigger Point Injection Trigger points are areas where you have pain. A trigger point injection is a shot given in the trigger point to help relieve pain for a few days to a few months. Common places for trigger points include:  The neck.  The shoulders.  The upper back.  The lower back.  A trigger point injection will not cure long-lasting (chronic) pain permanently. These injections do  not always work for every person, but for some people they can help to relieve pain for a few days to a few months. Tell a health care provider about:  Any allergies you have.  All medicines you are taking, including vitamins, herbs, eye drops, creams, and over-the-counter medicines.  Any problems you or family members have had with anesthetic medicines.  Any blood disorders you have.  Any surgeries you have had.  Any medical conditions you have. What are the risks? Generally, this is a safe procedure. However, problems may occur, including:  Infection.  Bleeding.  Allergic reaction to the injected medicine.  Irritation of the skin around the injection site.  What happens before the procedure?  Ask your health care provider about changing or stopping your regular medicines. This is especially important if you are taking diabetes medicines or blood thinners. What happens during the procedure?  Your health care provider will feel for trigger points. A marker may be used to circle the area for the injection.  The skin over the trigger point will be washed with a germ-killing (antiseptic) solution.  A thin needle is used for the shot. You may feel pain or a twitching feeling when the needle enters the trigger point.  A numbing solution may be injected into the trigger point. Sometimes a medicine to keep down swelling, redness, and warmth (inflammation) is also injected.  Your health care provider may move the needle around the area where  the trigger point is located until the tightness and twitching goes away.  After the injection, your health care provider may put gentle pressure over the injection site.  The injection site will be covered with a bandage (dressing). The procedure may vary among health care providers and hospitals. What happens after the procedure?  The dressing can be taken off in a few hours or as told by your health care provider.  You may feel sore and  stiff for 1-2 days. This information is not intended to replace advice given to you by your health care provider. Make sure you discuss any questions you have with your health care provider. Document Released: 11/18/2011 Document Revised: 08/01/2016 Document Reviewed: 05/19/2015 Elsevier Interactive Patient Education  2018 [employer].

## 2018-08-02 NOTE — Progress Notes (Signed)
Safety precautions to be maintained throughout the outpatient stay will include: orient to surroundings, keep bed in low position, maintain call bell within reach at all times, provide assistance with transfer out of bed and ambulation.  

## 2018-08-03 ENCOUNTER — Telehealth: Payer: Self-pay

## 2018-08-03 ENCOUNTER — Telehealth (HOSPITAL_COMMUNITY): Payer: Self-pay

## 2018-08-03 DIAGNOSIS — I2510 Atherosclerotic heart disease of native coronary artery without angina pectoris: Secondary | ICD-10-CM | POA: Diagnosis not present

## 2018-08-03 DIAGNOSIS — J9611 Chronic respiratory failure with hypoxia: Secondary | ICD-10-CM | POA: Diagnosis not present

## 2018-08-03 DIAGNOSIS — L03116 Cellulitis of left lower limb: Secondary | ICD-10-CM | POA: Diagnosis not present

## 2018-08-03 DIAGNOSIS — J449 Chronic obstructive pulmonary disease, unspecified: Secondary | ICD-10-CM | POA: Diagnosis not present

## 2018-08-03 NOTE — Telephone Encounter (Signed)
No problems reported.   °

## 2018-08-03 NOTE — Telephone Encounter (Signed)
Encounter complete. 

## 2018-08-07 ENCOUNTER — Other Ambulatory Visit: Payer: Self-pay

## 2018-08-07 ENCOUNTER — Ambulatory Visit (HOSPITAL_COMMUNITY): Payer: Medicare Other | Attending: Cardiology

## 2018-08-07 DIAGNOSIS — E119 Type 2 diabetes mellitus without complications: Secondary | ICD-10-CM | POA: Insufficient documentation

## 2018-08-07 DIAGNOSIS — I2510 Atherosclerotic heart disease of native coronary artery without angina pectoris: Secondary | ICD-10-CM | POA: Diagnosis not present

## 2018-08-07 DIAGNOSIS — J449 Chronic obstructive pulmonary disease, unspecified: Secondary | ICD-10-CM | POA: Insufficient documentation

## 2018-08-07 DIAGNOSIS — R0602 Shortness of breath: Secondary | ICD-10-CM | POA: Insufficient documentation

## 2018-08-07 DIAGNOSIS — E039 Hypothyroidism, unspecified: Secondary | ICD-10-CM | POA: Insufficient documentation

## 2018-08-07 DIAGNOSIS — I10 Essential (primary) hypertension: Secondary | ICD-10-CM | POA: Insufficient documentation

## 2018-08-07 DIAGNOSIS — E785 Hyperlipidemia, unspecified: Secondary | ICD-10-CM | POA: Diagnosis not present

## 2018-08-07 DIAGNOSIS — J441 Chronic obstructive pulmonary disease with (acute) exacerbation: Secondary | ICD-10-CM | POA: Diagnosis not present

## 2018-08-07 MED ORDER — PERFLUTREN LIPID MICROSPHERE
1.0000 mL | INTRAVENOUS | Status: AC | PRN
  Administered 2018-08-07: 2 mL via INTRAVENOUS

## 2018-08-08 ENCOUNTER — Ambulatory Visit (HOSPITAL_COMMUNITY)
Admission: RE | Admit: 2018-08-08 | Discharge: 2018-08-08 | Disposition: A | Discharge status: Home / Self Care | Payer: Medicare Other | Source: Ambulatory Visit | Attending: Cardiology | Admitting: Cardiology

## 2018-08-08 DIAGNOSIS — R0602 Shortness of breath: Secondary | ICD-10-CM | POA: Diagnosis not present

## 2018-08-08 LAB — MYOCARDIAL PERFUSION IMAGING
CHL CUP NUCLEAR SDS: 11
CHL CUP NUCLEAR SSS: 13
LV dias vol: 90 mL (ref 46–106)
LVSYSVOL: 33 mL
Peak HR: 82 {beats}/min
Rest HR: 78 {beats}/min
SRS: 2
TID: 1.17

## 2018-08-08 MED ORDER — TECHNETIUM TC 99M TETROFOSMIN IV KIT
10.5000 | PACK | Freq: Once | INTRAVENOUS | Status: AC | PRN
  Administered 2018-08-08: 10.5 via INTRAVENOUS
  Filled 2018-08-08: qty 11

## 2018-08-08 MED ORDER — TECHNETIUM TC 99M TETROFOSMIN IV KIT
31.6000 | PACK | Freq: Once | INTRAVENOUS | Status: AC | PRN
  Administered 2018-08-08: 31.6 via INTRAVENOUS
  Filled 2018-08-08: qty 32

## 2018-08-08 MED ORDER — ADENOSINE (DIAGNOSTIC) 3 MG/ML IV SOLN
0.5600 mg/kg | Freq: Once | INTRAVENOUS | Status: AC
  Administered 2018-08-08: 57.9 mg via INTRAVENOUS

## 2018-08-23 ENCOUNTER — Other Ambulatory Visit: Payer: Self-pay

## 2018-08-23 ENCOUNTER — Encounter: Payer: Self-pay | Admitting: Student in an Organized Health Care Education/Training Program

## 2018-08-23 ENCOUNTER — Ambulatory Visit
Payer: Medicare Other | Attending: Student in an Organized Health Care Education/Training Program | Admitting: Student in an Organized Health Care Education/Training Program

## 2018-08-23 VITALS — BP 144/83 | HR 88 | Temp 98.6°F | Resp 18 | Ht 66.5 in | Wt 220.0 lb

## 2018-08-23 DIAGNOSIS — E039 Hypothyroidism, unspecified: Secondary | ICD-10-CM | POA: Diagnosis not present

## 2018-08-23 DIAGNOSIS — M1712 Unilateral primary osteoarthritis, left knee: Secondary | ICD-10-CM | POA: Diagnosis not present

## 2018-08-23 DIAGNOSIS — M545 Low back pain: Secondary | ICD-10-CM | POA: Diagnosis not present

## 2018-08-23 DIAGNOSIS — F17210 Nicotine dependence, cigarettes, uncomplicated: Secondary | ICD-10-CM | POA: Insufficient documentation

## 2018-08-23 DIAGNOSIS — Z8249 Family history of ischemic heart disease and other diseases of the circulatory system: Secondary | ICD-10-CM | POA: Diagnosis not present

## 2018-08-23 DIAGNOSIS — Z885 Allergy status to narcotic agent status: Secondary | ICD-10-CM | POA: Insufficient documentation

## 2018-08-23 DIAGNOSIS — M4326 Fusion of spine, lumbar region: Secondary | ICD-10-CM | POA: Insufficient documentation

## 2018-08-23 DIAGNOSIS — F329 Major depressive disorder, single episode, unspecified: Secondary | ICD-10-CM | POA: Insufficient documentation

## 2018-08-23 DIAGNOSIS — M5136 Other intervertebral disc degeneration, lumbar region: Secondary | ICD-10-CM | POA: Insufficient documentation

## 2018-08-23 DIAGNOSIS — J9601 Acute respiratory failure with hypoxia: Secondary | ICD-10-CM | POA: Diagnosis not present

## 2018-08-23 DIAGNOSIS — M7918 Myalgia, other site: Secondary | ICD-10-CM | POA: Insufficient documentation

## 2018-08-23 DIAGNOSIS — Z886 Allergy status to analgesic agent status: Secondary | ICD-10-CM | POA: Insufficient documentation

## 2018-08-23 DIAGNOSIS — E785 Hyperlipidemia, unspecified: Secondary | ICD-10-CM | POA: Diagnosis not present

## 2018-08-23 DIAGNOSIS — G894 Chronic pain syndrome: Secondary | ICD-10-CM | POA: Diagnosis not present

## 2018-08-23 DIAGNOSIS — K219 Gastro-esophageal reflux disease without esophagitis: Secondary | ICD-10-CM | POA: Diagnosis not present

## 2018-08-23 DIAGNOSIS — I959 Hypotension, unspecified: Secondary | ICD-10-CM | POA: Diagnosis not present

## 2018-08-23 DIAGNOSIS — I5033 Acute on chronic diastolic (congestive) heart failure: Secondary | ICD-10-CM | POA: Diagnosis not present

## 2018-08-23 DIAGNOSIS — F419 Anxiety disorder, unspecified: Secondary | ICD-10-CM | POA: Diagnosis not present

## 2018-08-23 DIAGNOSIS — I110 Hypertensive heart disease with heart failure: Secondary | ICD-10-CM | POA: Diagnosis not present

## 2018-08-23 DIAGNOSIS — G2581 Restless legs syndrome: Secondary | ICD-10-CM | POA: Diagnosis not present

## 2018-08-23 DIAGNOSIS — Z79890 Hormone replacement therapy (postmenopausal): Secondary | ICD-10-CM | POA: Insufficient documentation

## 2018-08-23 DIAGNOSIS — Z79899 Other long term (current) drug therapy: Secondary | ICD-10-CM | POA: Insufficient documentation

## 2018-08-23 DIAGNOSIS — M7989 Other specified soft tissue disorders: Secondary | ICD-10-CM | POA: Insufficient documentation

## 2018-08-23 DIAGNOSIS — M47816 Spondylosis without myelopathy or radiculopathy, lumbar region: Secondary | ICD-10-CM | POA: Insufficient documentation

## 2018-08-23 DIAGNOSIS — J449 Chronic obstructive pulmonary disease, unspecified: Secondary | ICD-10-CM | POA: Insufficient documentation

## 2018-08-23 DIAGNOSIS — M961 Postlaminectomy syndrome, not elsewhere classified: Secondary | ICD-10-CM | POA: Insufficient documentation

## 2018-08-23 DIAGNOSIS — Z791 Long term (current) use of non-steroidal anti-inflammatories (NSAID): Secondary | ICD-10-CM | POA: Insufficient documentation

## 2018-08-23 DIAGNOSIS — Z7982 Long term (current) use of aspirin: Secondary | ICD-10-CM | POA: Insufficient documentation

## 2018-08-23 DIAGNOSIS — E119 Type 2 diabetes mellitus without complications: Secondary | ICD-10-CM | POA: Diagnosis not present

## 2018-08-23 DIAGNOSIS — R569 Unspecified convulsions: Secondary | ICD-10-CM | POA: Insufficient documentation

## 2018-08-23 MED ORDER — TIZANIDINE HCL 2 MG PO TABS: 2.0000 mg | ORAL_TABLET | Freq: Two times a day (BID) | ORAL | 1 refills | Status: AC | PRN

## 2018-08-23 NOTE — Progress Notes (Signed)
Safety precautions to be maintained throughout the outpatient stay will include: orient to surroundings, keep bed in low position, maintain call bell within reach at all times, provide assistance with transfer out of bed and ambulation.  

## 2018-08-23 NOTE — Progress Notes (Signed)
Patient's Name: [name]  MRN: [id number]  Referring Provider: Cyndi Bender, PA-C  DOB: 1956-05-04  PCP: Cyndi Bender, PA-C  DOS: 08/23/2018  Note by: Gillis Santa, MD  Service setting: Ambulatory outpatient  Specialty: Interventional Pain Management  Location: [facility] (AMB) Pain Management Facility    Patient type: Established   Primary Reason(s) for Visit: Encounter for post-procedure evaluation of chronic illness with mild to moderate exacerbation CC: Back Pain (lower, upper)  HPI  [name] is a 62 y.o. year old, female patient, who comes today for a post-procedure evaluation. She has Hypotension; Hypoxemia; Acute renal failure (HCC); COPD (chronic obstructive pulmonary disease) ([place]); HTN (hypertension); Osteoarthritis of left knee; Acute respiratory failure with hypoxia (HCC); COPD exacerbation ([place]); Community acquired pneumonia; Diabetes mellitus, type 2 ([place]); Diastolic CHF, acute on chronic ([place]); Hypokalemia; Sinus tachycardia; UTI (urinary tract infection); Acute renal failure (ARF) ([place]); Weakness generalized; Sepsis ([place]); Chronic diastolic CHF (congestive heart failure) ([place]); Respiratory failure ([place]); Acute respiratory failure ([place]); 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 Her primarily concern today is the Back Pain (lower, upper)  Pain Assessment: Location: Upper, Lower Back Radiating: left hip Onset: More than a month ago Duration: Chronic pain Quality: Aching Severity: 4 /10 (subjective, self-reported pain score)  Note: Reported level is compatible with observation.                         When using our objective Pain Scale, levels between 6 and 10/10 are said to belong in an  emergency room, as it progressively worsens from a 6/10, described as severely limiting, requiring emergency care not usually available at an outpatient pain management facility. At a 6/10 level, communication becomes difficult and requires great effort. Assistance to reach the emergency department may be required. Facial flushing and profuse sweating along with potentially dangerous increases in heart rate and blood pressure will be evident. Effect on ADL: unable to do housework Timing: Intermittent Modifying factors: rest BP: (!) 144/83  HR: 88  [name] comes in today for post-procedure evaluation after the treatment done on 08/03/2018.  Further details on both, my assessment(s), as well as the proposed treatment plan, please see below.  Post-Procedure Assessment  08/02/2018 Procedure: Trigger point injections, lumbosacral region  Influential Factors: BMI: 34.98 kg/m Intra-procedural challenges: None observed.         Assessment challenges: None detected.              Reported side-effects: None.        Post-procedural adverse reactions or complications: None reported         Sedation: Please see nurses note. When no sedatives are used, the analgesic levels obtained are directly associated to the effectiveness of the local anesthetics. However, when sedation is provided, the level of analgesia obtained during the initial 1 hour following the intervention, is believed to be the result of a combination of factors. These factors may include, but are not limited to: 1. The effectiveness of the local anesthetics used. 2. The effects of the analgesic(s) and/or anxiolytic(s) used. 3. The degree of discomfort experienced by the patient at the time of the procedure. 4. The patients ability and reliability in recalling and recording the events. 5. The presence and influence  of possible secondary gains and/or psychosocial factors. Reported result: Relief experienced during the 1st hour after the  procedure: 50 % (Ultra-Short Term Relief)            Interpretative annotation: Clinically appropriate result. Analgesia during this period is likely to be Local Anesthetic and/or IV Sedative (Analgesic/Anxiolytic) related.          Effects of local anesthetic: The analgesic effects attained during this period are directly associated to the localized infiltration of local anesthetics and therefore cary significant diagnostic value as to the etiological location, or anatomical origin, of the pain. Expected duration of relief is directly dependent on the pharmacodynamics of the local anesthetic used. Long-acting (4-6 hours) anesthetics used.  Reported result: Relief during the next 4 to 6 hour after the procedure:20%Short-Term Relief)            Interpretative annotation: Clinically appropriate result. Analgesia during this period is likely to be Local Anesthetic-related.          Current benefits: Defined as reported results that persistent at this point in time.   Analgesia: 0 %            Function: No benefit ROM: No benefit Interpretative annotation: Recurrence of symptoms. Limited therapeutic benefit. Results would argue against repeating therapy.          Interpretation: Results would suggest therapy to have a limited impact on the patient's condition.                  Plan:  Please see "Plan of Care" for details.                Laboratory Chemistry  Inflammation Markers (CRP: Acute Phase) (ESR: Chronic Phase) Lab Results  Component Value Date   LATICACIDVEN 1.7 04/14/2014                         Rheumatology Markers No results found for: RF, ANA, LABURIC, URICUR, LYMEIGGIGMAB, LYMEABIGMQN, HLAB27                      Renal Function Markers Lab Results  Component Value Date   BUN 21 04/15/2014   CREATININE 0.91 04/15/2014   GFRAA 80 (L) 04/15/2014   GFRNONAA 69 (L) 04/15/2014                             Hepatic Function Markers Lab Results  Component Value Date   AST 41  (H) 04/14/2014   ALT 18 04/14/2014   ALBUMIN 3.3 (L) 04/14/2014   ALKPHOS 26 (L) 04/14/2014   LIPASE 20 09/02/2013                        Electrolytes Lab Results  Component Value Date   NA 140 04/15/2014   K 4.3 04/15/2014   CL 106 04/15/2014   CALCIUM 8.5 04/15/2014   MG 1.8 04/15/2014   PHOS 2.9 04/15/2014                        Neuropathy Markers Lab Results  Component Value Date   HGBA1C 5.5 01/11/2013   HIV NON REACTIVE 01/11/2013                        CNS Tests Lab Results  Component Value Date   SDES URINE, CATHETERIZED  04/14/2014   CULT  04/14/2014    ESCHERICHIA COLI Performed at [facility]                        Bone Pathology Markers No results found for: Marveen Reeks, [id number], [id number], 25OHVITD1, 25OHVITD2, 25OHVITD3, TESTOFREE, TESTOSTERONE                       Coagulation Parameters Lab Results  Component Value Date   INR 1.08 07/09/2012   LABPROT 14.2 07/09/2012   APTT 35 10/26/2011   PLT 162 04/15/2014   DDIMER (H) 03/22/2011    0.78        AT THE INHOUSE ESTABLISHED CUTOFF VALUE OF 0.48 ug/mL FEU, THIS ASSAY HAS BEEN DOCUMENTED IN THE LITERATURE TO HAVE A SENSITIVITY AND NEGATIVE PREDICTIVE VALUE OF AT LEAST 98 TO 99%.  THE TEST RESULT SHOULD BE CORRELATED WITH AN ASSESSMENT OF THE CLINICAL PROBABILITY OF DVT / VTE.                        Cardiovascular Markers Lab Results  Component Value Date   CKTOTAL 234 (H) 03/22/2011   CKMB (HH) 03/22/2011    7.8 REPEATED TO VERIFY CRITICAL RESULT CALLED TO, READ BACK BY AND VERIFIED WITH: DENNINDC/[id number]/MURPHYD   TROPONINI <0.30 04/14/2014   HGB 12.8 04/15/2014   HCT 39.7 04/15/2014                         CA Markers No results found for: CEA, CA125, LABCA2                      Note: Lab results reviewed.  Recent Diagnostic Imaging Results  MYOCARDIAL PERFUSION IMAGING  The left ventricular ejection fraction is normal (55-65%).  Nuclear stress EF:  64%.  There was no ST segment deviation noted during stress.  Defect 1: There is a small defect of mild severity present in the mid  anteroseptal location.  The study is normal.  This is a low risk study.   Normal stress nuclear study with mild soft tissue attenuation but no  ischemia.  Gated ejection fraction 64% with normal wall motion.  Complexity Note: Imaging results reviewed. Results shared with [name], using Layman's terms.                         Meds   Current Outpatient Medications:  .  albuterol (PROVENTIL HFA;VENTOLIN HFA) 108 (90 BASE) MCG/ACT inhaler, Inhale 2 puffs into the lungs every 6 (six) hours as needed. For shortness of breath/wheezing., Disp: , Rfl:  .  amLODipine (NORVASC) 5 MG tablet, Take 5 mg by mouth daily. , Disp: , Rfl:  .  ARIPiprazole (ABILIFY) 10 MG tablet, Take 10 mg by mouth daily. , Disp: , Rfl:  .  ASPIRIN 81 PO, Take by mouth daily., Disp: , Rfl:  .  beta carotene 25000 UNIT capsule, Take 25,000 Units by mouth daily., Disp: , Rfl:  .  busPIRone (BUSPAR) 30 MG tablet, Take 30 mg by mouth 2 (two) times daily., Disp: , Rfl:  .  escitalopram (LEXAPRO) 20 MG tablet, Take 20 mg by mouth every morning. , Disp: , Rfl:  .  fenofibrate micronized (LOFIBRA) 134 MG capsule, Take 134 mg by mouth daily before breakfast. , Disp: , Rfl:  .  furosemide (LASIX) 40 MG  tablet, Take 40 mg by mouth daily as needed for fluid or edema. , Disp: , Rfl:  .  gabapentin (NEURONTIN) 800 MG tablet, Take 800 mg by mouth 3 (three) times daily. , Disp: , Rfl:  .  hydrOXYzine (ATARAX/VISTARIL) 25 MG tablet, Take 25 mg by mouth every 6 (six) hours as needed for anxiety or itching. , Disp: , Rfl:  .  ibuprofen (ADVIL,MOTRIN) 200 MG tablet, Take 200 mg by mouth every 6 (six) hours as needed for moderate pain., Disp: , Rfl:  .  levETIRAcetam (KEPPRA) 500 MG tablet, Take 1 tablet (500 mg total) by mouth 2 (two) times daily., Disp: 60 tablet, Rfl: 0 .  levothyroxine (SYNTHROID,  LEVOTHROID) 25 MCG tablet, Take 25 mcg by mouth daily before breakfast. , Disp: , Rfl:  .  losartan (COZAAR) 100 MG tablet, Take 100 mg by mouth daily. , Disp: , Rfl:  .  nortriptyline (PAMELOR) 75 MG capsule, Take 75 mg by mouth at bedtime. , Disp: , Rfl:  .  pantoprazole (PROTONIX) 40 MG tablet, Take 40 mg by mouth every morning. , Disp: , Rfl:  .  ranitidine (ZANTAC) 150 MG tablet, Take 150 mg by mouth 2 (two) times daily. , Disp: , Rfl:  .  tiZANidine (ZANAFLEX) 2 MG tablet, Take 1 tablet (2 mg total) by mouth 2 (two) times daily as needed for muscle spasms., Disp: 60 tablet, Rfl: 1 No current facility-administered medications for this visit.   Facility-Administered Medications Ordered in Other Visits:  .  chlorhexidine (HIBICLENS) 4 % liquid 4 application, 60 mL, Topical, Once, Duffy, Kellie Shropshire, PA-C .  chlorhexidine (HIBICLENS) 4 % liquid 4 application, 60 mL, Topical, Once, Duffy, Kellie Shropshire, PA-C  ROS  Constitutional: Denies any fever or chills Gastrointestinal: No reported hemesis, hematochezia, vomiting, or acute GI distress Musculoskeletal: Denies any acute onset joint swelling, redness, loss of ROM, or weakness Neurological: No reported episodes of acute onset apraxia, aphasia, dysarthria, agnosia, amnesia, paralysis, loss of coordination, or loss of consciousness  Allergies  [name] is allergic to acetaminophen; aspirin; nsaids; and vicodin [hydrocodone-acetaminophen].  PFSH  Drug: [name]  reports that she does not use drugs. Alcohol:  reports that she drinks alcohol. Tobacco:  reports that she has been smoking cigarettes. She has a 40.00 pack-year smoking history. She has never used smokeless tobacco. Medical:  has a past medical history of Anxiety, Arthritis, COPD (chronic obstructive pulmonary disease) ([place]), Depression, Diabetes mellitus, GERD (gastroesophageal reflux disease), Hyperlipidemia, Hypertension, Hypothyroid, Restless leg syndrome, and Seizures ([place]). Surgical: Ms.  Harrington  has a past surgical history that includes laminectomy and diskectomy; Knee surgery; Carpal tunnel release; Joint replacement (08/26/2011 ); and Knee Arthroplasty (10/28/2011). Family: family history includes Alzheimer's disease in her mother; Heart attack (age of onset: 73) in her father.  Constitutional Exam  General appearance: Well nourished, well developed, and well hydrated. In no apparent acute distress Vitals:   08/23/18 1151  BP: (!) 144/83  Pulse: 88  Resp: 18  Temp: 98.6 F (37 C)  TempSrc: Oral  SpO2: 91%  Weight: 220 lb (99.8 kg)  Height: 5' 6.5" (1.689 m)   BMI Assessment: Estimated body mass index is 34.98 kg/m as calculated from the following:   Height as of this encounter: 5' 6.5" (1.689 m).   Weight as of this encounter: 220 lb (99.8 kg).  BMI interpretation table: BMI level Category Range association with higher incidence of chronic pain  <18 kg/m2 Underweight   18.5-24.9 kg/m2 Ideal body weight  25-29.9 kg/m2 Overweight Increased incidence by 20%  30-34.9 kg/m2 Obese (Class I) Increased incidence by 68%  35-39.9 kg/m2 Severe obesity (Class II) Increased incidence by 136%  >40 kg/m2 Extreme obesity (Class III) Increased incidence by 254%   Patient's current BMI Ideal Body weight  Body mass index is 34.98 kg/m. Ideal body weight: 60.5 kg (133 lb 4.3 oz) Adjusted ideal body weight: 76.2 kg (167 lb 15.4 oz)   BMI Readings from Last 4 Encounters:  08/23/18 34.98 kg/m  08/08/18 35.71 kg/m  08/02/18 34.46 kg/m  07/31/18 35.71 kg/m   Wt Readings from Last 4 Encounters:  08/23/18 220 lb (99.8 kg)  08/08/18 228 lb (103.4 kg)  08/02/18 220 lb (99.8 kg)  07/31/18 228 lb (103.4 kg)  Psych/Mental status: Alert, oriented x 3 (person, place, & time)       Eyes: PERLA Respiratory: No evidence of acute respiratory distress  Cervical Spine Area Exam  Skin & Axial Inspection: No masses, redness, edema, swelling, or associated skin lesions Alignment:  Symmetrical Functional ROM: Unrestricted ROM      Stability: No instability detected Muscle Tone/Strength: Functionally intact. No obvious neuro-muscular anomalies detected. Sensory (Neurological): Unimpaired Palpation: No palpable anomalies              Upper Extremity (UE) Exam    Side: Right upper extremity  Side: Left upper extremity  Skin & Extremity Inspection: Skin color, temperature, and hair growth are WNL. No peripheral edema or cyanosis. No masses, redness, swelling, asymmetry, or associated skin lesions. No contractures.  Skin & Extremity Inspection: Skin color, temperature, and hair growth are WNL. No peripheral edema or cyanosis. No masses, redness, swelling, asymmetry, or associated skin lesions. No contractures.  Functional ROM: Unrestricted ROM          Functional ROM: Unrestricted ROM          Muscle Tone/Strength: Functionally intact. No obvious neuro-muscular anomalies detected.  Muscle Tone/Strength: Functionally intact. No obvious neuro-muscular anomalies detected.  Sensory (Neurological): Unimpaired          Sensory (Neurological): Unimpaired          Palpation: No palpable anomalies              Palpation: No palpable anomalies              Provocative Test(s):  Phalen's test: deferred Tinel's test: deferred Apley's scratch test (touch opposite shoulder):  Action 1 (Across chest): deferred Action 2 (Overhead): deferred Action 3 (LB reach): deferred   Provocative Test(s):  Phalen's test: deferred Tinel's test: deferred Apley's scratch test (touch opposite shoulder):  Action 1 (Across chest): deferred Action 2 (Overhead): deferred Action 3 (LB reach): deferred    Thoracic Spine Area Exam  Skin & Axial Inspection: No masses, redness, or swelling Alignment: Symmetrical Functional ROM: Unrestricted ROM Stability: No instability detected Muscle Tone/Strength: Functionally intact. No obvious neuro-muscular anomalies detected. Sensory (Neurological):  Unimpaired Muscle strength & Tone: No palpable anomalies  Lumbar Spine Area Exam  Skin & Axial Inspection: Well healed scar from previous spine surgery detected Alignment: Asymmetric Functional ROM: Decreased ROM affecting both sides Stability: No instability detected Muscle Tone/Strength: Functionally intact. No obvious neuro-muscular anomalies detected. Sensory (Neurological): Musculoskeletal pain pattern Palpation: Complains of area being tender to palpation Bilateral Fist Percussion Test Provocative Tests: Hyperextension/rotation test: (+) bilaterally for facet joint pain. Lumbar quadrant test (Kemp's test): (+) due to fusion restriction. Lateral bending test: (+) due to pain. Patrick's Maneuver: deferred today  FABER test: deferred today                   S-I anterior distraction/compression test: deferred today         S-I lateral compression test: deferred today         S-I Thigh-thrust test: deferred today         S-I Gaenslen's test: deferred today           Gait & Posture Assessment  Ambulation: Patient ambulates using a cane Gait: Limited. Using assistive device to ambulate Posture: Difficulty standing up straight, due to pain   Lower Extremity Exam    Side: Right lower extremity  Side: Left lower extremity  Stability: No instability observed          Stability: No instability observed          Skin & Extremity Inspection: Skin color, temperature, and hair growth are WNL. No peripheral edema or cyanosis. No masses, redness, swelling, asymmetry, or associated skin lesions. No contractures.  Skin & Extremity Inspection: Skin color, temperature, and hair growth are WNL. No peripheral edema or cyanosis. No masses, redness, swelling, asymmetry, or associated skin lesions. No contractures.  Functional ROM: Unrestricted ROM                  Functional ROM: Unrestricted ROM                  Muscle Tone/Strength: Functionally intact. No obvious  neuro-muscular anomalies detected.  Muscle Tone/Strength: Functionally intact. No obvious neuro-muscular anomalies detected.  Sensory (Neurological): Unimpaired  Sensory (Neurological): Unimpaired  Palpation: No palpable anomalies  Palpation: No palpable anomalies   Assessment  Primary Diagnosis & Pertinent Problem List: The primary encounter diagnosis was Myofascial pain syndrome. Diagnoses of Chronic pain syndrome, Fusion of lumbar spine (L3-S1), Failed back surgical syndrome, Lumbar degenerative disc disease, and Lumbar spondylosis were also pertinent to this visit.  Status Diagnosis  Persistent Persistent Persistent 1. Myofascial pain syndrome   2. Chronic pain syndrome   3. Fusion of lumbar spine (L3-S1)   4. Failed back surgical syndrome   5. Lumbar degenerative disc disease   6. Lumbar spondylosis     Problems updated and reviewed during this visit: Problem  Myofascial Pain Syndrome  Lumbar Spondylosis   General Recommendations: The pain condition that the patient suffers from is best treated with a multidisciplinary approach that involves an increase in physical activity to prevent de-conditioning and worsening of the pain cycle, as well as psychological counseling (formal and/or informal) to address the co-morbid psychological affects of pain. Treatment will often involve judicious use of pain medications and interventional procedures to decrease the pain, allowing the patient to participate in the physical activity that will ultimately produce long-lasting pain reductions. The goal of the multidisciplinary approach is to return the patient to a higher level of overall function and to restore their ability to perform activities of daily living.  62 year old female with a history of chronic axial low back pain secondary to L3-S1 lumbar spine fusion in 2010 with severe spinal stenosis, lumbar degenerative disc disease, failed back surgical syndrome and facet arthropathy.  Patient  has tried various injections in the past which were not very effective.  She states that her lumbar spine surgery was helpful for the first couple years and then her back started to become more painful.  Patient does have bilateral S1 screw fractures which her surgeon is aware of and does not need any surgical  intervention according to his evaluation.  Patient's current medications include gabapentin 800 mg 3 times a day, nortriptyline 75 mg nightly.  Patient also has a history of depression and anxiety and she is on BuSpar, Abilify.  She denies any bowel or bladder dysfunction.  Patient ambulance with a cane.  She has stopped her Xanax and has not taken it for over 1 year.  She did have one urine drug screen that did not show any oxycodone that she was being prescribed however upon further discussion, patient states that she had just run out of her medications before the clinic visit and that is why her UDS was not positive for oxycodone.  Patient has not been on any opioid therapy since 05/19/2017.  She states that her quality of life is worse and that her functional status is worse since not being on opioid medications.  Patient is status post deep lumbar paraspinal trigger point injections which were only effective short-term for her axial low back pain.  She states that the first day or 2 after the injection she had pain relief and improvement in her ability to turn laterally.  She states that on postprocedure day 3, her low back pain was back to baseline.  In regards to medication management, I would like to avoid opioid medications in this patient who is moderately obese and has a history of COPD as well as psychiatric disease.  Trigger point injections were not effective. Will trial Tizanidine for muscle spasms and MSK-related pain.   Can consider tramadol or buprenorphine transdermal patch if muscle relaxant not helpful.  Patient's prior opioid regimen consisted of oxycodone 15 mg 4 times a day with her  last fill of that on June 2018. (MME equals 90).  Patient at that time was also on Xanax.  She is currently not on any of these medications.  She is only taking gabapentin 800 mg 3 times a day and nortriptyline along with her psychotropic medications. Instructed to continue those.  Plan of Care  Pharmacotherapy (Medications Ordered): Meds ordered this encounter  Medications  . tiZANidine (ZANAFLEX) 2 MG tablet    Sig: Take 1 tablet (2 mg total) by mouth 2 (two) times daily as needed for muscle spasms.    Dispense:  60 tablet    Refill:  1    Do not place medication on "Automatic Refill". Fill one day early if pharmacy is closed on scheduled refill date.   Provider-requested follow-up: Return in about 6 weeks (around 10/04/2018) for Medication Management.  Future Appointments  Date Time Provider [place]  10/04/2018  1:45 PM Gillis Santa, MD [facility] None   Time Note: Greater than 50% of the 25 minute(s) of face-to-face time spent with [name], was spent in counseling/coordination of care regarding: [name] primary cause of pain, the treatment plan, treatment alternatives, medication side effects, going over the informed consent, the results, interpretation and significance of  her recent diagnostic interventional treatment(s), the appropriate use of her medications and realistic expectations.  Primary Care Physician: Cyndi Bender, PA-C Location: [facility] Note by: Gillis Santa, M.D Date: 08/23/2018; Time: 2:43 PM  Patient Instructions  Tizanidine has been escribed to your pharmacy.

## 2018-08-23 NOTE — Patient Instructions (Signed)
Tizanidine has been escribed to your pharmacy.

## 2018-08-29 DIAGNOSIS — E119 Type 2 diabetes mellitus without complications: Secondary | ICD-10-CM | POA: Diagnosis not present

## 2018-08-29 DIAGNOSIS — E039 Hypothyroidism, unspecified: Secondary | ICD-10-CM | POA: Diagnosis not present

## 2018-08-29 DIAGNOSIS — I10 Essential (primary) hypertension: Secondary | ICD-10-CM | POA: Diagnosis not present

## 2018-08-29 DIAGNOSIS — E782 Mixed hyperlipidemia: Secondary | ICD-10-CM | POA: Diagnosis not present

## 2018-09-07 DIAGNOSIS — J441 Chronic obstructive pulmonary disease with (acute) exacerbation: Secondary | ICD-10-CM | POA: Diagnosis not present

## 2018-09-07 DIAGNOSIS — I2510 Atherosclerotic heart disease of native coronary artery without angina pectoris: Secondary | ICD-10-CM | POA: Diagnosis not present

## 2018-10-04 ENCOUNTER — Encounter: Payer: Medicare Other | Admitting: Student in an Organized Health Care Education/Training Program

## 2018-10-07 DIAGNOSIS — I2510 Atherosclerotic heart disease of native coronary artery without angina pectoris: Secondary | ICD-10-CM | POA: Diagnosis not present

## 2018-10-07 DIAGNOSIS — J441 Chronic obstructive pulmonary disease with (acute) exacerbation: Secondary | ICD-10-CM | POA: Diagnosis not present

## 2018-10-20 ENCOUNTER — Other Ambulatory Visit: Payer: Self-pay | Admitting: Family Medicine

## 2018-10-20 DIAGNOSIS — Z1231 Encounter for screening mammogram for malignant neoplasm of breast: Secondary | ICD-10-CM

## 2018-10-23 DIAGNOSIS — E785 Hyperlipidemia, unspecified: Secondary | ICD-10-CM | POA: Diagnosis not present

## 2018-10-23 DIAGNOSIS — Z136 Encounter for screening for cardiovascular disorders: Secondary | ICD-10-CM | POA: Diagnosis not present

## 2018-10-23 DIAGNOSIS — Z139 Encounter for screening, unspecified: Secondary | ICD-10-CM | POA: Diagnosis not present

## 2018-10-23 DIAGNOSIS — Z0000 Encounter for general adult medical examination without abnormal findings: Secondary | ICD-10-CM | POA: Diagnosis not present

## 2018-11-07 DIAGNOSIS — I2510 Atherosclerotic heart disease of native coronary artery without angina pectoris: Secondary | ICD-10-CM | POA: Diagnosis not present

## 2018-11-07 DIAGNOSIS — J441 Chronic obstructive pulmonary disease with (acute) exacerbation: Secondary | ICD-10-CM | POA: Diagnosis not present

## 2018-11-14 DIAGNOSIS — H35033 Hypertensive retinopathy, bilateral: Secondary | ICD-10-CM | POA: Diagnosis not present

## 2018-11-15 DIAGNOSIS — N3281 Overactive bladder: Secondary | ICD-10-CM | POA: Diagnosis not present

## 2018-11-15 DIAGNOSIS — G2581 Restless legs syndrome: Secondary | ICD-10-CM | POA: Diagnosis not present

## 2018-11-15 DIAGNOSIS — M549 Dorsalgia, unspecified: Secondary | ICD-10-CM | POA: Diagnosis not present

## 2018-12-04 ENCOUNTER — Ambulatory Visit
Admission: RE | Admit: 2018-12-04 | Discharge: 2018-12-04 | Disposition: A | Discharge status: Home / Self Care | Payer: Medicare Other | Source: Ambulatory Visit | Attending: Family Medicine | Admitting: Family Medicine

## 2018-12-04 DIAGNOSIS — Z1231 Encounter for screening mammogram for malignant neoplasm of breast: Secondary | ICD-10-CM | POA: Diagnosis not present

## 2018-12-07 DIAGNOSIS — I2510 Atherosclerotic heart disease of native coronary artery without angina pectoris: Secondary | ICD-10-CM | POA: Diagnosis not present

## 2018-12-07 DIAGNOSIS — J441 Chronic obstructive pulmonary disease with (acute) exacerbation: Secondary | ICD-10-CM | POA: Diagnosis not present

## 2018-12-14 DIAGNOSIS — J449 Chronic obstructive pulmonary disease, unspecified: Secondary | ICD-10-CM | POA: Diagnosis not present

## 2018-12-14 DIAGNOSIS — J441 Chronic obstructive pulmonary disease with (acute) exacerbation: Secondary | ICD-10-CM | POA: Diagnosis not present

## 2018-12-18 DIAGNOSIS — N952 Postmenopausal atrophic vaginitis: Secondary | ICD-10-CM | POA: Diagnosis not present

## 2018-12-18 DIAGNOSIS — Z79899 Other long term (current) drug therapy: Secondary | ICD-10-CM | POA: Diagnosis not present

## 2018-12-18 DIAGNOSIS — N8111 Cystocele, midline: Secondary | ICD-10-CM | POA: Diagnosis not present

## 2019-01-05 DIAGNOSIS — G8929 Other chronic pain: Secondary | ICD-10-CM | POA: Diagnosis not present

## 2019-01-05 DIAGNOSIS — M25552 Pain in left hip: Secondary | ICD-10-CM | POA: Diagnosis not present

## 2019-01-05 DIAGNOSIS — M25551 Pain in right hip: Secondary | ICD-10-CM | POA: Diagnosis not present

## 2019-01-05 DIAGNOSIS — G2581 Restless legs syndrome: Secondary | ICD-10-CM | POA: Diagnosis not present

## 2019-01-05 DIAGNOSIS — G894 Chronic pain syndrome: Secondary | ICD-10-CM | POA: Diagnosis not present

## 2019-01-05 DIAGNOSIS — E119 Type 2 diabetes mellitus without complications: Secondary | ICD-10-CM | POA: Diagnosis not present

## 2019-01-05 DIAGNOSIS — Z79891 Long term (current) use of opiate analgesic: Secondary | ICD-10-CM | POA: Diagnosis not present

## 2019-01-05 DIAGNOSIS — K219 Gastro-esophageal reflux disease without esophagitis: Secondary | ICD-10-CM | POA: Diagnosis not present

## 2019-01-07 DIAGNOSIS — J441 Chronic obstructive pulmonary disease with (acute) exacerbation: Secondary | ICD-10-CM | POA: Diagnosis not present

## 2019-01-07 DIAGNOSIS — I2510 Atherosclerotic heart disease of native coronary artery without angina pectoris: Secondary | ICD-10-CM | POA: Diagnosis not present

## 2019-01-17 DIAGNOSIS — M25552 Pain in left hip: Secondary | ICD-10-CM | POA: Diagnosis not present

## 2019-01-17 DIAGNOSIS — M48061 Spinal stenosis, lumbar region without neurogenic claudication: Secondary | ICD-10-CM | POA: Diagnosis not present

## 2019-01-17 DIAGNOSIS — G894 Chronic pain syndrome: Secondary | ICD-10-CM | POA: Diagnosis not present

## 2019-01-17 DIAGNOSIS — G2581 Restless legs syndrome: Secondary | ICD-10-CM | POA: Diagnosis not present

## 2019-01-17 DIAGNOSIS — K219 Gastro-esophageal reflux disease without esophagitis: Secondary | ICD-10-CM | POA: Diagnosis not present

## 2019-01-17 DIAGNOSIS — E119 Type 2 diabetes mellitus without complications: Secondary | ICD-10-CM | POA: Diagnosis not present

## 2019-01-17 DIAGNOSIS — G8929 Other chronic pain: Secondary | ICD-10-CM | POA: Diagnosis not present

## 2019-01-17 DIAGNOSIS — Z79891 Long term (current) use of opiate analgesic: Secondary | ICD-10-CM | POA: Diagnosis not present

## 2019-01-25 DIAGNOSIS — W548XXA Other contact with dog, initial encounter: Secondary | ICD-10-CM | POA: Diagnosis not present

## 2019-01-25 DIAGNOSIS — R600 Localized edema: Secondary | ICD-10-CM | POA: Diagnosis not present

## 2019-01-26 DIAGNOSIS — M47816 Spondylosis without myelopathy or radiculopathy, lumbar region: Secondary | ICD-10-CM | POA: Diagnosis not present

## 2019-01-26 DIAGNOSIS — Z981 Arthrodesis status: Secondary | ICD-10-CM | POA: Diagnosis not present

## 2019-01-26 DIAGNOSIS — M5136 Other intervertebral disc degeneration, lumbar region: Secondary | ICD-10-CM | POA: Diagnosis not present

## 2019-02-15 DIAGNOSIS — J301 Allergic rhinitis due to pollen: Secondary | ICD-10-CM | POA: Diagnosis not present

## 2019-02-18 DIAGNOSIS — J309 Allergic rhinitis, unspecified: Secondary | ICD-10-CM | POA: Diagnosis not present

## 2019-02-19 DIAGNOSIS — J309 Allergic rhinitis, unspecified: Secondary | ICD-10-CM | POA: Diagnosis not present

## 2019-02-20 DIAGNOSIS — N952 Postmenopausal atrophic vaginitis: Secondary | ICD-10-CM | POA: Diagnosis not present

## 2019-02-20 DIAGNOSIS — J309 Allergic rhinitis, unspecified: Secondary | ICD-10-CM | POA: Diagnosis not present

## 2019-02-20 DIAGNOSIS — N8111 Cystocele, midline: Secondary | ICD-10-CM | POA: Diagnosis not present

## 2019-02-21 DIAGNOSIS — G894 Chronic pain syndrome: Secondary | ICD-10-CM | POA: Diagnosis not present

## 2019-02-21 DIAGNOSIS — J309 Allergic rhinitis, unspecified: Secondary | ICD-10-CM | POA: Diagnosis not present

## 2019-02-21 DIAGNOSIS — Z79891 Long term (current) use of opiate analgesic: Secondary | ICD-10-CM | POA: Diagnosis not present

## 2019-02-21 DIAGNOSIS — M545 Low back pain: Secondary | ICD-10-CM | POA: Diagnosis not present

## 2019-02-21 DIAGNOSIS — M25552 Pain in left hip: Secondary | ICD-10-CM | POA: Diagnosis not present

## 2019-02-21 DIAGNOSIS — M25562 Pain in left knee: Secondary | ICD-10-CM | POA: Diagnosis not present

## 2019-02-21 DIAGNOSIS — M25551 Pain in right hip: Secondary | ICD-10-CM | POA: Diagnosis not present

## 2019-02-21 DIAGNOSIS — G8929 Other chronic pain: Secondary | ICD-10-CM | POA: Diagnosis not present

## 2019-02-21 DIAGNOSIS — M25561 Pain in right knee: Secondary | ICD-10-CM | POA: Diagnosis not present

## 2019-02-22 DIAGNOSIS — J309 Allergic rhinitis, unspecified: Secondary | ICD-10-CM | POA: Diagnosis not present

## 2019-02-23 DIAGNOSIS — J309 Allergic rhinitis, unspecified: Secondary | ICD-10-CM | POA: Diagnosis not present

## 2019-02-28 DIAGNOSIS — Z79891 Long term (current) use of opiate analgesic: Secondary | ICD-10-CM | POA: Diagnosis not present

## 2019-02-28 DIAGNOSIS — M542 Cervicalgia: Secondary | ICD-10-CM | POA: Diagnosis not present

## 2019-02-28 DIAGNOSIS — M25511 Pain in right shoulder: Secondary | ICD-10-CM | POA: Diagnosis not present

## 2019-02-28 DIAGNOSIS — G8929 Other chronic pain: Secondary | ICD-10-CM | POA: Diagnosis not present

## 2019-02-28 DIAGNOSIS — G894 Chronic pain syndrome: Secondary | ICD-10-CM | POA: Diagnosis not present

## 2019-02-28 DIAGNOSIS — M170 Bilateral primary osteoarthritis of knee: Secondary | ICD-10-CM | POA: Diagnosis not present

## 2019-02-28 DIAGNOSIS — M1711 Unilateral primary osteoarthritis, right knee: Secondary | ICD-10-CM | POA: Diagnosis not present

## 2019-02-28 DIAGNOSIS — M1712 Unilateral primary osteoarthritis, left knee: Secondary | ICD-10-CM | POA: Diagnosis not present

## 2019-03-09 DIAGNOSIS — E039 Hypothyroidism, unspecified: Secondary | ICD-10-CM | POA: Diagnosis not present

## 2019-03-09 DIAGNOSIS — I10 Essential (primary) hypertension: Secondary | ICD-10-CM | POA: Diagnosis not present

## 2019-03-09 DIAGNOSIS — E782 Mixed hyperlipidemia: Secondary | ICD-10-CM | POA: Diagnosis not present

## 2019-03-19 DIAGNOSIS — J329 Chronic sinusitis, unspecified: Secondary | ICD-10-CM | POA: Diagnosis not present

## 2019-03-21 DIAGNOSIS — Z79891 Long term (current) use of opiate analgesic: Secondary | ICD-10-CM | POA: Diagnosis not present

## 2019-03-21 DIAGNOSIS — M25561 Pain in right knee: Secondary | ICD-10-CM | POA: Diagnosis not present

## 2019-03-21 DIAGNOSIS — M25562 Pain in left knee: Secondary | ICD-10-CM | POA: Diagnosis not present

## 2019-03-21 DIAGNOSIS — M25552 Pain in left hip: Secondary | ICD-10-CM | POA: Diagnosis not present

## 2019-03-21 DIAGNOSIS — M25551 Pain in right hip: Secondary | ICD-10-CM | POA: Diagnosis not present

## 2019-03-21 DIAGNOSIS — G8929 Other chronic pain: Secondary | ICD-10-CM | POA: Diagnosis not present

## 2019-03-21 DIAGNOSIS — M545 Low back pain: Secondary | ICD-10-CM | POA: Diagnosis not present

## 2019-03-21 DIAGNOSIS — G894 Chronic pain syndrome: Secondary | ICD-10-CM | POA: Diagnosis not present

## 2019-03-29 DIAGNOSIS — H00016 Hordeolum externum left eye, unspecified eyelid: Secondary | ICD-10-CM | POA: Diagnosis not present

## 2019-03-29 DIAGNOSIS — H02844 Edema of left upper eyelid: Secondary | ICD-10-CM | POA: Diagnosis not present

## 2019-04-09 DIAGNOSIS — I10 Essential (primary) hypertension: Secondary | ICD-10-CM | POA: Diagnosis not present

## 2019-04-09 DIAGNOSIS — E119 Type 2 diabetes mellitus without complications: Secondary | ICD-10-CM | POA: Diagnosis not present

## 2019-04-09 DIAGNOSIS — N183 Chronic kidney disease, stage 3 (moderate): Secondary | ICD-10-CM | POA: Diagnosis not present

## 2019-04-09 DIAGNOSIS — H1032 Unspecified acute conjunctivitis, left eye: Secondary | ICD-10-CM | POA: Diagnosis not present

## 2019-04-18 DIAGNOSIS — Z79891 Long term (current) use of opiate analgesic: Secondary | ICD-10-CM | POA: Diagnosis not present

## 2019-04-18 DIAGNOSIS — J301 Allergic rhinitis due to pollen: Secondary | ICD-10-CM | POA: Diagnosis not present

## 2019-04-18 DIAGNOSIS — J3081 Allergic rhinitis due to animal (cat) (dog) hair and dander: Secondary | ICD-10-CM | POA: Diagnosis not present

## 2019-04-18 DIAGNOSIS — E119 Type 2 diabetes mellitus without complications: Secondary | ICD-10-CM | POA: Diagnosis not present

## 2019-04-18 DIAGNOSIS — M545 Low back pain: Secondary | ICD-10-CM | POA: Diagnosis not present

## 2019-04-18 DIAGNOSIS — G8929 Other chronic pain: Secondary | ICD-10-CM | POA: Diagnosis not present

## 2019-04-18 DIAGNOSIS — M25551 Pain in right hip: Secondary | ICD-10-CM | POA: Diagnosis not present

## 2019-04-18 DIAGNOSIS — G894 Chronic pain syndrome: Secondary | ICD-10-CM | POA: Diagnosis not present

## 2019-05-16 DIAGNOSIS — G894 Chronic pain syndrome: Secondary | ICD-10-CM | POA: Diagnosis not present

## 2019-05-16 DIAGNOSIS — M25561 Pain in right knee: Secondary | ICD-10-CM | POA: Diagnosis not present

## 2019-05-16 DIAGNOSIS — M25552 Pain in left hip: Secondary | ICD-10-CM | POA: Diagnosis not present

## 2019-05-16 DIAGNOSIS — M545 Low back pain: Secondary | ICD-10-CM | POA: Diagnosis not present

## 2019-05-16 DIAGNOSIS — G8929 Other chronic pain: Secondary | ICD-10-CM | POA: Diagnosis not present

## 2019-05-16 DIAGNOSIS — M25551 Pain in right hip: Secondary | ICD-10-CM | POA: Diagnosis not present

## 2019-05-16 DIAGNOSIS — M25562 Pain in left knee: Secondary | ICD-10-CM | POA: Diagnosis not present

## 2019-05-16 DIAGNOSIS — Z79891 Long term (current) use of opiate analgesic: Secondary | ICD-10-CM | POA: Diagnosis not present

## 2019-05-23 DIAGNOSIS — N390 Urinary tract infection, site not specified: Secondary | ICD-10-CM | POA: Diagnosis not present

## 2019-05-25 DIAGNOSIS — G2581 Restless legs syndrome: Secondary | ICD-10-CM | POA: Diagnosis not present

## 2019-05-25 DIAGNOSIS — J9611 Chronic respiratory failure with hypoxia: Secondary | ICD-10-CM | POA: Diagnosis not present

## 2019-05-25 DIAGNOSIS — G4762 Sleep related leg cramps: Secondary | ICD-10-CM | POA: Diagnosis not present

## 2019-05-25 DIAGNOSIS — D751 Secondary polycythemia: Secondary | ICD-10-CM | POA: Diagnosis not present

## 2019-05-30 DIAGNOSIS — J9611 Chronic respiratory failure with hypoxia: Secondary | ICD-10-CM | POA: Diagnosis not present

## 2019-06-13 DIAGNOSIS — M25551 Pain in right hip: Secondary | ICD-10-CM | POA: Diagnosis not present

## 2019-06-13 DIAGNOSIS — M25552 Pain in left hip: Secondary | ICD-10-CM | POA: Diagnosis not present

## 2019-06-13 DIAGNOSIS — K219 Gastro-esophageal reflux disease without esophagitis: Secondary | ICD-10-CM | POA: Diagnosis not present

## 2019-06-13 DIAGNOSIS — J3081 Allergic rhinitis due to animal (cat) (dog) hair and dander: Secondary | ICD-10-CM | POA: Diagnosis not present

## 2019-06-13 DIAGNOSIS — J301 Allergic rhinitis due to pollen: Secondary | ICD-10-CM | POA: Diagnosis not present

## 2019-06-29 DIAGNOSIS — J9611 Chronic respiratory failure with hypoxia: Secondary | ICD-10-CM | POA: Diagnosis not present

## 2019-07-11 DIAGNOSIS — J301 Allergic rhinitis due to pollen: Secondary | ICD-10-CM | POA: Diagnosis not present

## 2019-07-11 DIAGNOSIS — M25551 Pain in right hip: Secondary | ICD-10-CM | POA: Diagnosis not present

## 2019-07-11 DIAGNOSIS — M25552 Pain in left hip: Secondary | ICD-10-CM | POA: Diagnosis not present

## 2019-07-11 DIAGNOSIS — J3081 Allergic rhinitis due to animal (cat) (dog) hair and dander: Secondary | ICD-10-CM | POA: Diagnosis not present

## 2019-07-11 DIAGNOSIS — G894 Chronic pain syndrome: Secondary | ICD-10-CM | POA: Diagnosis not present

## 2019-07-11 DIAGNOSIS — Z79891 Long term (current) use of opiate analgesic: Secondary | ICD-10-CM | POA: Diagnosis not present

## 2019-07-11 DIAGNOSIS — K219 Gastro-esophageal reflux disease without esophagitis: Secondary | ICD-10-CM | POA: Diagnosis not present

## 2019-07-11 DIAGNOSIS — G8929 Other chronic pain: Secondary | ICD-10-CM | POA: Diagnosis not present

## 2019-12-28 ENCOUNTER — Inpatient Hospital Stay (HOSPITAL_COMMUNITY)
Admission: EM | Admit: 2019-12-28 | Discharge: 2020-01-03 | DRG: 190 | Disposition: A | Discharge status: Home Health | Payer: Medicare Other | Attending: Student | Admitting: Student

## 2019-12-28 ENCOUNTER — Other Ambulatory Visit: Payer: Self-pay

## 2019-12-28 ENCOUNTER — Emergency Department (HOSPITAL_COMMUNITY): Payer: Medicare Other

## 2019-12-28 DIAGNOSIS — E872 Acidosis: Secondary | ICD-10-CM | POA: Diagnosis present

## 2019-12-28 DIAGNOSIS — I5033 Acute on chronic diastolic (congestive) heart failure: Secondary | ICD-10-CM | POA: Diagnosis present

## 2019-12-28 DIAGNOSIS — J9621 Acute and chronic respiratory failure with hypoxia: Secondary | ICD-10-CM | POA: Diagnosis present

## 2019-12-28 DIAGNOSIS — M47816 Spondylosis without myelopathy or radiculopathy, lumbar region: Secondary | ICD-10-CM | POA: Diagnosis present

## 2019-12-28 DIAGNOSIS — R682 Dry mouth, unspecified: Secondary | ICD-10-CM | POA: Diagnosis present

## 2019-12-28 DIAGNOSIS — I878 Other specified disorders of veins: Secondary | ICD-10-CM | POA: Diagnosis present

## 2019-12-28 DIAGNOSIS — E785 Hyperlipidemia, unspecified: Secondary | ICD-10-CM | POA: Diagnosis present

## 2019-12-28 DIAGNOSIS — Z7982 Long term (current) use of aspirin: Secondary | ICD-10-CM

## 2019-12-28 DIAGNOSIS — R7989 Other specified abnormal findings of blood chemistry: Secondary | ICD-10-CM | POA: Diagnosis present

## 2019-12-28 DIAGNOSIS — F329 Major depressive disorder, single episode, unspecified: Secondary | ICD-10-CM | POA: Diagnosis present

## 2019-12-28 DIAGNOSIS — R0902 Hypoxemia: Secondary | ICD-10-CM | POA: Diagnosis present

## 2019-12-28 DIAGNOSIS — I509 Heart failure, unspecified: Secondary | ICD-10-CM

## 2019-12-28 DIAGNOSIS — J449 Chronic obstructive pulmonary disease, unspecified: Secondary | ICD-10-CM | POA: Diagnosis not present

## 2019-12-28 DIAGNOSIS — I110 Hypertensive heart disease with heart failure: Secondary | ICD-10-CM | POA: Diagnosis present

## 2019-12-28 DIAGNOSIS — Z96651 Presence of right artificial knee joint: Secondary | ICD-10-CM | POA: Diagnosis present

## 2019-12-28 DIAGNOSIS — J9622 Acute and chronic respiratory failure with hypercapnia: Secondary | ICD-10-CM | POA: Diagnosis present

## 2019-12-28 DIAGNOSIS — Z79899 Other long term (current) drug therapy: Secondary | ICD-10-CM

## 2019-12-28 DIAGNOSIS — K219 Gastro-esophageal reflux disease without esophagitis: Secondary | ICD-10-CM | POA: Diagnosis present

## 2019-12-28 DIAGNOSIS — F17200 Nicotine dependence, unspecified, uncomplicated: Secondary | ICD-10-CM | POA: Diagnosis not present

## 2019-12-28 DIAGNOSIS — D751 Secondary polycythemia: Secondary | ICD-10-CM | POA: Diagnosis present

## 2019-12-28 DIAGNOSIS — J9601 Acute respiratory failure with hypoxia: Secondary | ICD-10-CM | POA: Diagnosis not present

## 2019-12-28 DIAGNOSIS — F419 Anxiety disorder, unspecified: Secondary | ICD-10-CM | POA: Diagnosis present

## 2019-12-28 DIAGNOSIS — G2581 Restless legs syndrome: Secondary | ICD-10-CM | POA: Diagnosis present

## 2019-12-28 DIAGNOSIS — Z720 Tobacco use: Secondary | ICD-10-CM | POA: Diagnosis not present

## 2019-12-28 DIAGNOSIS — J441 Chronic obstructive pulmonary disease with (acute) exacerbation: Secondary | ICD-10-CM | POA: Diagnosis present

## 2019-12-28 DIAGNOSIS — G894 Chronic pain syndrome: Secondary | ICD-10-CM | POA: Diagnosis present

## 2019-12-28 DIAGNOSIS — R569 Unspecified convulsions: Secondary | ICD-10-CM | POA: Diagnosis present

## 2019-12-28 DIAGNOSIS — R5381 Other malaise: Secondary | ICD-10-CM | POA: Diagnosis not present

## 2019-12-28 DIAGNOSIS — Z20822 Contact with and (suspected) exposure to covid-19: Secondary | ICD-10-CM | POA: Diagnosis not present

## 2019-12-28 DIAGNOSIS — R778 Other specified abnormalities of plasma proteins: Secondary | ICD-10-CM | POA: Diagnosis not present

## 2019-12-28 DIAGNOSIS — E119 Type 2 diabetes mellitus without complications: Secondary | ICD-10-CM | POA: Diagnosis present

## 2019-12-28 DIAGNOSIS — Z6833 Body mass index (BMI) 33.0-33.9, adult: Secondary | ICD-10-CM

## 2019-12-28 DIAGNOSIS — E039 Hypothyroidism, unspecified: Secondary | ICD-10-CM | POA: Diagnosis present

## 2019-12-28 DIAGNOSIS — Z9981 Dependence on supplemental oxygen: Secondary | ICD-10-CM

## 2019-12-28 DIAGNOSIS — Z981 Arthrodesis status: Secondary | ICD-10-CM

## 2019-12-28 DIAGNOSIS — Z888 Allergy status to other drugs, medicaments and biological substances status: Secondary | ICD-10-CM

## 2019-12-28 DIAGNOSIS — Z886 Allergy status to analgesic agent status: Secondary | ICD-10-CM

## 2019-12-28 DIAGNOSIS — Z8249 Family history of ischemic heart disease and other diseases of the circulatory system: Secondary | ICD-10-CM

## 2019-12-28 DIAGNOSIS — E1140 Type 2 diabetes mellitus with diabetic neuropathy, unspecified: Secondary | ICD-10-CM | POA: Diagnosis not present

## 2019-12-28 DIAGNOSIS — R0602 Shortness of breath: Secondary | ICD-10-CM | POA: Diagnosis not present

## 2019-12-28 DIAGNOSIS — I248 Other forms of acute ischemic heart disease: Secondary | ICD-10-CM | POA: Diagnosis present

## 2019-12-28 DIAGNOSIS — Z885 Allergy status to narcotic agent status: Secondary | ICD-10-CM

## 2019-12-28 DIAGNOSIS — I10 Essential (primary) hypertension: Secondary | ICD-10-CM | POA: Diagnosis present

## 2019-12-28 DIAGNOSIS — Z79890 Hormone replacement therapy (postmenopausal): Secondary | ICD-10-CM

## 2019-12-28 DIAGNOSIS — F17210 Nicotine dependence, cigarettes, uncomplicated: Secondary | ICD-10-CM | POA: Diagnosis present

## 2019-12-28 DIAGNOSIS — Z9119 Patient's noncompliance with other medical treatment and regimen: Secondary | ICD-10-CM

## 2019-12-28 LAB — CBC WITH DIFFERENTIAL/PLATELET
Abs Immature Granulocytes: 0.05 10*3/uL (ref 0.00–0.07)
Basophils Absolute: 0.1 10*3/uL (ref 0.0–0.1)
Basophils Relative: 1 %
Eosinophils Absolute: 0 10*3/uL (ref 0.0–0.5)
Eosinophils Relative: 1 %
HCT: 53.6 % — ABNORMAL HIGH (ref 36.0–46.0)
Hemoglobin: 16.9 g/dL — ABNORMAL HIGH (ref 12.0–15.0)
Immature Granulocytes: 1 %
Lymphocytes Relative: 20 %
Lymphs Abs: 1.6 10*3/uL (ref 0.7–4.0)
MCH: 29.1 pg (ref 26.0–34.0)
MCHC: 31.5 g/dL (ref 30.0–36.0)
MCV: 92.3 fL (ref 80.0–100.0)
Monocytes Absolute: 0.5 10*3/uL (ref 0.1–1.0)
Monocytes Relative: 6 %
Neutro Abs: 5.9 10*3/uL (ref 1.7–7.7)
Neutrophils Relative %: 71 %
Platelets: 195 10*3/uL (ref 150–400)
RBC: 5.81 MIL/uL — ABNORMAL HIGH (ref 3.87–5.11)
RDW: 19.3 % — ABNORMAL HIGH (ref 11.5–15.5)
WBC: 8.2 10*3/uL (ref 4.0–10.5)
nRBC: 0 % (ref 0.0–0.2)

## 2019-12-28 LAB — COMPREHENSIVE METABOLIC PANEL
ALT: 16 U/L (ref 0–44)
AST: 27 U/L (ref 15–41)
Albumin: 3.7 g/dL (ref 3.5–5.0)
Alkaline Phosphatase: 23 U/L — ABNORMAL LOW (ref 38–126)
Anion gap: 13 (ref 5–15)
BUN: 9 mg/dL (ref 8–23)
CO2: 25 mmol/L (ref 22–32)
Calcium: 9 mg/dL (ref 8.9–10.3)
Chloride: 94 mmol/L — ABNORMAL LOW (ref 98–111)
Creatinine, Ser: 1.02 mg/dL — ABNORMAL HIGH (ref 0.44–1.00)
GFR calc Af Amer: >60 mL/min (ref >60)
GFR calc non Af Amer: 59 mL/min — ABNORMAL LOW (ref >60)
Glucose, Bld: 104 mg/dL — ABNORMAL HIGH (ref 70–99)
Potassium: 4.5 mmol/L (ref 3.5–5.1)
Sodium: 132 mmol/L — ABNORMAL LOW (ref 135–145)
Total Bilirubin: 1.5 mg/dL — ABNORMAL HIGH (ref 0.3–1.2)
Total Protein: 7.1 g/dL (ref 6.5–8.1)

## 2019-12-28 LAB — URINALYSIS, ROUTINE W REFLEX MICROSCOPIC
Bilirubin Urine: NEGATIVE
Glucose, UA: NEGATIVE mg/dL
Hgb urine dipstick: NEGATIVE
Ketones, ur: NEGATIVE mg/dL
Leukocytes,Ua: NEGATIVE
Nitrite: NEGATIVE
Protein, ur: NEGATIVE mg/dL
Specific Gravity, Urine: 1.004 — ABNORMAL LOW (ref 1.005–1.030)
pH: 5 (ref 5.0–8.0)

## 2019-12-28 LAB — TROPONIN I (HIGH SENSITIVITY)
Troponin I (High Sensitivity): 56 ng/L — ABNORMAL HIGH (ref <18)
Troponin I (High Sensitivity): 60 ng/L — ABNORMAL HIGH (ref <18)

## 2019-12-28 LAB — POC SARS CORONAVIRUS 2 AG -  ED: SARS Coronavirus 2 Ag: NEGATIVE

## 2019-12-28 LAB — PROTIME-INR
INR: 1.2 (ref 0.8–1.2)
Prothrombin Time: 14.9 seconds (ref 11.4–15.2)

## 2019-12-28 LAB — BRAIN NATRIURETIC PEPTIDE: B Natriuretic Peptide: 421.1 pg/mL — ABNORMAL HIGH (ref 0.0–100.0)

## 2019-12-28 MED ORDER — ALBUTEROL SULFATE HFA 108 (90 BASE) MCG/ACT IN AERS: 2.0000 | INHALATION_SPRAY | RESPIRATORY_TRACT | Status: DC | PRN

## 2019-12-28 MED ORDER — ALBUTEROL SULFATE (2.5 MG/3ML) 0.083% IN NEBU: 2.5000 mg | INHALATION_SOLUTION | RESPIRATORY_TRACT | Status: DC | PRN

## 2019-12-28 MED ORDER — LEVOTHYROXINE SODIUM 25 MCG PO TABS
25.0000 ug | ORAL_TABLET | Freq: Every day | ORAL | Status: DC
  Administered 2019-12-29 – 2020-01-03 (×7): 25 ug via ORAL
  Filled 2019-12-28 (×7): qty 1

## 2019-12-28 MED ORDER — PANTOPRAZOLE SODIUM 40 MG PO TBEC
40.0000 mg | DELAYED_RELEASE_TABLET | Freq: Every morning | ORAL | Status: DC
  Administered 2019-12-29 – 2020-01-03 (×6): 40 mg via ORAL
  Filled 2019-12-28 (×6): qty 1

## 2019-12-28 MED ORDER — OXYCODONE-ACETAMINOPHEN 5-325 MG PO TABS
2.0000 | ORAL_TABLET | Freq: Once | ORAL | Status: AC
  Administered 2019-12-28: 20:00:00 2 via ORAL
  Filled 2019-12-28: qty 2

## 2019-12-28 MED ORDER — ONDANSETRON HCL 4 MG/2ML IJ SOLN: 4.0000 mg | Freq: Four times a day (QID) | INTRAMUSCULAR | Status: DC | PRN

## 2019-12-28 MED ORDER — LOSARTAN POTASSIUM 50 MG PO TABS
100.0000 mg | ORAL_TABLET | Freq: Every day | ORAL | Status: DC
  Administered 2019-12-29 – 2019-12-31 (×3): 100 mg via ORAL
  Filled 2019-12-28 (×3): qty 2

## 2019-12-28 MED ORDER — ESCITALOPRAM OXALATE 10 MG PO TABS
20.0000 mg | ORAL_TABLET | Freq: Every morning | ORAL | Status: DC
  Administered 2019-12-29 – 2020-01-03 (×6): 20 mg via ORAL
  Filled 2019-12-28 (×6): qty 2

## 2019-12-28 MED ORDER — BUSPIRONE HCL 5 MG PO TABS
30.0000 mg | ORAL_TABLET | Freq: Two times a day (BID) | ORAL | Status: DC
  Administered 2019-12-29 – 2020-01-03 (×11): 30 mg via ORAL
  Filled 2019-12-28 (×6): qty 6
  Filled 2019-12-28: qty 3
  Filled 2019-12-28 (×4): qty 6

## 2019-12-28 MED ORDER — NICOTINE 21 MG/24HR TD PT24
21.0000 mg | MEDICATED_PATCH | Freq: Every day | TRANSDERMAL | Status: DC
  Administered 2019-12-29 – 2020-01-03 (×6): 21 mg via TRANSDERMAL
  Filled 2019-12-28 (×6): qty 1

## 2019-12-28 MED ORDER — INSULIN ASPART 100 UNIT/ML ~~LOC~~ SOLN
0.0000 [IU] | Freq: Three times a day (TID) | SUBCUTANEOUS | Status: DC
  Administered 2019-12-29 – 2020-01-02 (×3): 1 [IU] via SUBCUTANEOUS

## 2019-12-28 MED ORDER — FAMOTIDINE 20 MG PO TABS
10.0000 mg | ORAL_TABLET | Freq: Every day | ORAL | Status: DC
  Administered 2019-12-29 – 2020-01-03 (×6): 10 mg via ORAL
  Filled 2019-12-28 (×6): qty 1

## 2019-12-28 MED ORDER — FUROSEMIDE 10 MG/ML IJ SOLN
40.0000 mg | Freq: Once | INTRAMUSCULAR | Status: AC
  Administered 2019-12-28: 17:00:00 40 mg via INTRAVENOUS
  Filled 2019-12-28: qty 4

## 2019-12-28 MED ORDER — SODIUM CHLORIDE 0.9 % IV SOLN: 250.0000 mL | INTRAVENOUS | Status: DC | PRN

## 2019-12-28 MED ORDER — FENOFIBRATE 54 MG PO TABS
54.0000 mg | ORAL_TABLET | Freq: Every day | ORAL | Status: DC
  Administered 2019-12-29 – 2020-01-02 (×5): 54 mg via ORAL
  Filled 2019-12-28 (×6): qty 1

## 2019-12-28 MED ORDER — ONDANSETRON HCL 4 MG PO TABS: 4.0000 mg | ORAL_TABLET | Freq: Four times a day (QID) | ORAL | Status: DC | PRN

## 2019-12-28 MED ORDER — SODIUM CHLORIDE 0.9% FLUSH: 3.0000 mL | INTRAVENOUS | Status: DC | PRN

## 2019-12-28 MED ORDER — NORTRIPTYLINE HCL 25 MG PO CAPS
75.0000 mg | ORAL_CAPSULE | Freq: Every day | ORAL | Status: DC
  Administered 2019-12-29 – 2019-12-31 (×3): 75 mg via ORAL
  Filled 2019-12-28 (×3): qty 3

## 2019-12-28 MED ORDER — ARIPIPRAZOLE 5 MG PO TABS
10.0000 mg | ORAL_TABLET | Freq: Every day | ORAL | Status: DC
  Administered 2019-12-29 – 2020-01-03 (×6): 10 mg via ORAL
  Filled 2019-12-28 (×6): qty 2
  Filled 2019-12-28: qty 1

## 2019-12-28 MED ORDER — FUROSEMIDE 10 MG/ML IJ SOLN
40.0000 mg | Freq: Two times a day (BID) | INTRAMUSCULAR | Status: DC
  Administered 2019-12-29 – 2020-01-01 (×7): 40 mg via INTRAVENOUS
  Filled 2019-12-28 (×7): qty 4

## 2019-12-28 MED ORDER — IOHEXOL 350 MG/ML SOLN
100.0000 mL | Freq: Once | INTRAVENOUS | Status: AC | PRN
  Administered 2019-12-28: 100 mL via INTRAVENOUS

## 2019-12-28 MED ORDER — GABAPENTIN 400 MG PO CAPS
800.0000 mg | ORAL_CAPSULE | Freq: Three times a day (TID) | ORAL | Status: DC
  Administered 2019-12-29 – 2019-12-31 (×9): 800 mg via ORAL
  Filled 2019-12-28 (×9): qty 2

## 2019-12-28 MED ORDER — SODIUM CHLORIDE 0.9% FLUSH
3.0000 mL | Freq: Two times a day (BID) | INTRAVENOUS | Status: DC
  Administered 2019-12-28 – 2020-01-02 (×9): 3 mL via INTRAVENOUS

## 2019-12-28 MED ORDER — HYDROXYZINE HCL 25 MG PO TABS: 25.0000 mg | ORAL_TABLET | Freq: Four times a day (QID) | ORAL | Status: DC | PRN

## 2019-12-28 MED ORDER — INSULIN ASPART 100 UNIT/ML ~~LOC~~ SOLN: 0.0000 [IU] | Freq: Every day | SUBCUTANEOUS | Status: DC

## 2019-12-28 MED ORDER — ASPIRIN 81 MG PO CHEW
81.0000 mg | CHEWABLE_TABLET | Freq: Every day | ORAL | Status: DC
  Administered 2019-12-29 – 2020-01-03 (×6): 81 mg via ORAL
  Filled 2019-12-28 (×6): qty 1

## 2019-12-28 MED ORDER — LEVETIRACETAM 500 MG PO TABS
500.0000 mg | ORAL_TABLET | Freq: Two times a day (BID) | ORAL | Status: DC
  Administered 2019-12-29 – 2020-01-03 (×12): 500 mg via ORAL
  Filled 2019-12-28 (×13): qty 1

## 2019-12-28 MED ORDER — ENOXAPARIN SODIUM 40 MG/0.4ML ~~LOC~~ SOLN
40.0000 mg | Freq: Every day | SUBCUTANEOUS | Status: DC
  Administered 2019-12-29 – 2020-01-03 (×6): 40 mg via SUBCUTANEOUS
  Filled 2019-12-28 (×6): qty 0.4

## 2019-12-28 NOTE — ED Provider Notes (Signed)
[facility] Provider Note   CSN: [id number] Arrival date & time: 12/28/19  1605     History Chief Complaint  Patient presents with  . Shortness of Breath    [name] is a 64 y.o. female.  HPI 64 year old female with history of COPD, on home 2 L at night, hyperlipidemia, hypertension, CHF, presenting to the emergency department for shortness of breath over the past 2 weeks, dyspnea on exertion as well as orthopnea.  Also reports some mild increase in her lower extremity swelling, currently on Lasix.  Patient denies any chest pain, just states that over the past few weeks she has had increasing shortness of breath and fatigue.  Went to her PCPs office today for an appointment, noted to be hypoxic in the mid 63s on room air.  Patient states that she does not feel short of breath or have any respiratory distress currently, was instructed to come to the ED for further evaluation.  On arrival to the ED, patient satting in the lower 60s on room air, placed on nonrebreather and brought back to the patient room.  Patient been placed on 3 L with good response, satting 96% on 3 L nasal cannula.  Patient denies any fevers or chills, does report an increase in her cough, denies any production of green phlegm or any other type of fluid, denies any hemoptysis or hematemesis, no abdominal pain, nausea or vomiting or diarrhea, no sick contacts or recent travel.    Past Medical History:  Diagnosis Date  . Anxiety   . Arthritis   . COPD (chronic obstructive pulmonary disease) (HCC)    no inhalers--smoker, no oxygen  . Depression   . Diabetes mellitus    no meds-diet controlled  . GERD (gastroesophageal reflux disease)   . Hyperlipidemia   . Hypertension   . Hypothyroid   . Restless leg syndrome   . Seizures ([place])    02/13/2011 -hx of seizure due to "hypertensive encephalopathy in setting of narcotic withdrawal" --pt had run out of her pain medicine she was  taking for her knee and back pain.  no seizure since--pt does take keppra and office note from neurologist dr. Leta Baptist on this chart    Patient Active Problem List   Diagnosis Date Noted  . Myofascial pain syndrome 08/23/2018  . Lumbar spondylosis 08/23/2018  . Leg swelling 07/31/2018  . SOB (shortness of breath) 07/31/2018  . Tobacco abuse 07/31/2018  . Fusion of lumbar spine (L3-S1) 07/20/2018  . 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 Lumbar degenerative disc disease 07/20/2018  . Drug overdose 04/18/2014  . Chronic pain syndrome 04/18/2014  . 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 Respiratory failure ([place]) 04/14/2014  . Acute respiratory failure ([place]) 04/14/2014  . UTI (urinary tract infection) 04/28/2013  . Acute renal failure (ARF) ([place]) 04/28/2013  . Weakness generalized 04/28/2013  . Sepsis ([place]) 04/28/2013  . Chronic diastolic CHF (congestive heart failure) ([place]) 04/28/2013  . Hypokalemia 01/11/2013  . Sinus tachycardia 01/11/2013  . Acute respiratory failure with hypoxia ([place]) 01/10/2013  . COPD exacerbation ([place]) 01/10/2013  . Community acquired pneumonia 01/10/2013  . Diabetes mellitus, type 2 ([place]) 01/10/2013  . Diastolic CHF, acute on chronic (HCC) 01/10/2013  . Osteoarthritis of left knee 10/27/2011  . Hypoxemia 03/29/2011  . Acute renal failure ([place]) 03/29/2011  . COPD (chronic obstructive pulmonary disease) ([place]) 03/29/2011  . HTN (hypertension) 03/29/2011  . Hypotension 03/25/2011  Past Surgical History:  Procedure Laterality Date  . CARPAL TUNNEL RELEASE     left  . JOINT REPLACEMENT  08/26/2011    right total knee arthroplasty  . KNEE ARTHROPLASTY  10/28/2011   Procedure: COMPUTER ASSISTED TOTAL KNEE ARTHROPLASTY;  Surgeon: Karen Chafe Rendall III;  Location: [facility];  Service: Orthopedics;  Laterality: Left;  preop femoral nerve block  . KNEE SURGERY     right  . laminectomy and diskectomy     L4-5  with fusion     OB History   No obstetric history on file.     Family History  Problem Relation Age of Onset  . Heart attack Father 103  . Alzheimer's disease Mother   . Breast cancer Neg Hx     Social History   Tobacco Use  . Smoking status: Current Every Day Smoker    Packs/day: 1.00    Years: 40.00    Pack years: 40.00    Types: Cigarettes  . Smokeless tobacco: Never Used  Substance Use Topics  . Alcohol use: Yes    Comment: ocasionally  . Drug use: No    Home Medications Prior to Admission medications   Medication Sig Start Date End Date Taking? Authorizing Provider  albuterol (PROVENTIL HFA;VENTOLIN HFA) 108 (90 BASE) MCG/ACT inhaler Inhale 2 puffs into the lungs every 6 (six) hours as needed. For shortness of breath/wheezing.    [provider]  amLODipine (NORVASC) 5 MG tablet Take 5 mg by mouth daily.  07/04/17   [provider]  ARIPiprazole (ABILIFY) 10 MG tablet Take 10 mg by mouth daily.  07/28/17   [provider]  ASPIRIN 81 PO Take by mouth daily.    [provider]  beta carotene 25000 UNIT capsule Take 25,000 Units by mouth daily.    [provider]  busPIRone (BUSPAR) 30 MG tablet Take 30 mg by mouth 2 (two) times daily.    [provider]  escitalopram (LEXAPRO) 20 MG tablet Take 20 mg by mouth every morning.     [provider]  fenofibrate micronized (LOFIBRA) 134 MG capsule Take 134 mg by mouth daily before breakfast.     [provider]  furosemide (LASIX) 40 MG tablet Take 40 mg by mouth daily as needed for fluid or edema.     [provider]  gabapentin (NEURONTIN) 800 MG tablet Take 800 mg by mouth 3 (three) times daily.  06/23/17   [provider]  hydrOXYzine (ATARAX/VISTARIL) 25 MG tablet Take 25 mg by mouth every 6 (six) hours as needed for anxiety or itching.  07/28/17   [provider]  ibuprofen (ADVIL,MOTRIN) 200 MG tablet Take 200 mg by mouth  every 6 (six) hours as needed for moderate pain.    [provider]  levETIRAcetam (KEPPRA) 500 MG tablet Take 1 tablet (500 mg total) by mouth 2 (two) times daily. 04/18/14   Kelvin Cellar, MD  levothyroxine (SYNTHROID, LEVOTHROID) 25 MCG tablet Take 25 mcg by mouth daily before breakfast.  06/09/17   [provider]  losartan (COZAAR) 100 MG tablet Take 100 mg by mouth daily.  07/20/17   [provider]  nortriptyline (PAMELOR) 75 MG capsule Take 75 mg by mouth at bedtime.  07/27/17   [provider]  pantoprazole (PROTONIX) 40 MG tablet Take 40 mg by mouth every morning.  08/16/13   [provider]  ranitidine (ZANTAC) 150 MG tablet Take 150 mg by mouth 2 (two) times  daily.  06/24/17   [provider]    Allergies    Acetaminophen, Aspirin, Nsaids, and Vicodin [hydrocodone-acetaminophen]  Review of Systems   Review of Systems  Constitutional: Positive for chills and fatigue. Negative for fever.  HENT: Negative for ear pain and sore throat.   Eyes: Negative for pain and visual disturbance.  Respiratory: Positive for shortness of breath. Negative for cough.   Cardiovascular: Positive for chest pain. Negative for palpitations.  Gastrointestinal: Negative for abdominal pain and vomiting.  Genitourinary: Negative for dysuria and hematuria.  Musculoskeletal: Negative for arthralgias and back pain.  Skin: Negative for color change and rash.  Neurological: Negative for seizures and syncope.  All other systems reviewed and are negative.   Physical Exam Updated Vital Signs BP (!) 175/87   Pulse 73   Temp 98.7 F (37.1 C) (Oral)   Resp 16   SpO2 100%   Physical Exam Vitals and nursing note reviewed.  Constitutional:      General: She is not in acute distress.    Appearance: She is not toxic-appearing or diaphoretic.     Interventions: She is not intubated.    Comments: Chronically ill appearing  HENT:     Head: Normocephalic and  atraumatic.     Mouth/Throat:     Pharynx: No pharyngeal swelling or oropharyngeal exudate.  Eyes:     Conjunctiva/sclera: Conjunctivae normal.  Neck:     Vascular: No hepatojugular reflux or JVD.  Cardiovascular:     Rate and Rhythm: Normal rate and regular rhythm.     Heart sounds: No murmur.  Pulmonary:     Effort: Pulmonary effort is normal. No bradypnea, accessory muscle usage or respiratory distress. She is not intubated.     Breath sounds: No stridor. Examination of the right-lower field reveals rales. Examination of the left-lower field reveals rales. Rales present. No decreased breath sounds, wheezing or rhonchi.  Chest:     Chest wall: No mass, deformity, tenderness or crepitus.  Abdominal:     Palpations: Abdomen is soft. There is no hepatomegaly or splenomegaly.     Tenderness: There is no abdominal tenderness.  Musculoskeletal:     Cervical back: Neck supple.     Right lower leg: Edema present.     Left lower leg: Edema present.  Skin:    General: Skin is warm and dry.  Neurological:     General: No focal deficit present.     Mental Status: She is alert.  Psychiatric:        Mood and Affect: Mood normal.        Behavior: Behavior normal.     ED Results / Procedures / Treatments   Labs (all labs ordered are listed, but only abnormal results are displayed) Labs Reviewed  CBC WITH DIFFERENTIAL/PLATELET - Abnormal; Notable for the following components:      Result Value   RBC 5.81 (*)    Hemoglobin 16.9 (*)    HCT 53.6 (*)    RDW 19.3 (*)    All other components within normal limits  COMPREHENSIVE METABOLIC PANEL - Abnormal; Notable for the following components:   Sodium 132 (*)    Chloride 94 (*)    Glucose, Bld 104 (*)    Creatinine, Ser 1.02 (*)    Alkaline Phosphatase 23 (*)    Total Bilirubin 1.5 (*)    GFR calc non Af Amer 59 (*)    All other components within normal limits  URINALYSIS, ROUTINE W REFLEX  MICROSCOPIC - Abnormal; Notable for the  following components:   Color, Urine STRAW (*)    Specific Gravity, Urine 1.004 (*)    All other components within normal limits  BRAIN NATRIURETIC PEPTIDE - Abnormal; Notable for the following components:   B Natriuretic Peptide 421.1 (*)    All other components within normal limits  TROPONIN I (HIGH SENSITIVITY) - Abnormal; Notable for the following components:   Troponin I (High Sensitivity) 56 (*)    All other components within normal limits  PROTIME-INR  POC SARS CORONAVIRUS 2 AG -  ED  TROPONIN I (HIGH SENSITIVITY)    EKG EKG Interpretation  Date/Time:  Friday December 28 2019 16:14:51 EST Ventricular Rate:  80 PR Interval:  172 QRS Duration: 84 QT Interval:  366 QTC Calculation: 422 R Axis:   100 Text Interpretation: Normal sinus rhythm Rightward axis Borderline ECG Confirmed by Quintella Reichert [phone]) on 12/28/2019 4:21:16 PM   Radiology CT Angio Chest PE W and/or Wo Contrast  Result Date: 12/28/2019 CLINICAL DATA:  Hypoxemia EXAM: CT ANGIOGRAPHY CHEST WITH CONTRAST TECHNIQUE: Multidetector CT imaging of the chest was performed using the standard protocol during bolus administration of intravenous contrast. Multiplanar CT image reconstructions and MIPs were obtained to evaluate the vascular anatomy. CONTRAST:  175mL OMNIPAQUE IOHEXOL 350 MG/ML SOLN COMPARISON:  09/09/2016 FINDINGS: Cardiovascular: No filling defects in the pulmonary arteries to suggest pulmonary emboli. Heart is borderline enlarged. Aorta is normal caliber. Scattered coronary artery and aortic calcifications. Mediastinum/Nodes: No mediastinal, hilar, or axillary adenopathy. Trachea and esophagus are unremarkable. Thyroid unremarkable dural Lungs/Pleura: Scarring or atelectasis in the peripheral right upper lobe, right middle lobe and lingula. No confluent opacities or effusions. Upper Abdomen: Imaging into the upper abdomen shows no acute findings. Musculoskeletal: Chest wall soft tissues are unremarkable. No  acute bony abnormality. Review of the MIP images confirms the above findings. IMPRESSION: . no evidence of pulmonary embolus. Mild cardiomegaly.  Coronary artery disease. Areas of atelectasis and/or scarring in the right upper lobe, right middle lobe and lingula. Aortic Atherosclerosis (ICD10-I70.0). Electronically Signed   By: Rolm Baptise M.D.   On: 12/28/2019 19:25   DG Chest Portable 1 View  Result Date: 12/28/2019 CLINICAL DATA:  64 year old female with hypoxia EXAM: PORTABLE CHEST 1 VIEW COMPARISON:  .  Chest radiograph date 07/14/2018. FINDINGS: Bilateral mid to lower lung field mild interstitial nodularity noted. Developing pneumonia, atypical or viral is not excluded clinical correlation is recommended. No lobar consolidation, pleural effusion, pneumothorax. There is mild cardiomegaly. There is prominence of the central pulmonary arteries suggestive of a degree of pulmonary hypertension. Atherosclerotic calcification of the aortic arch. No acute osseous pathology. IMPRESSION: 1. Findings concerning for developing infiltrate at the lung bases, possibly viral or atypical etiology. Clinical correlation recommended. 2. Mild cardiomegaly with findings suggestive of pulmonary hypertension. Electronically Signed   By: Anner Crete M.D.   On: 12/28/2019 17:09    Procedures Procedures (including critical care time)  Medications Ordered in ED Medications  oxyCODONE-acetaminophen (PERCOCET/ROXICET) 5-325 MG per tablet 2 tablet (has no administration in time range)  furosemide (LASIX) injection 40 mg (40 mg Intravenous Given 12/28/19 1648)  iohexol (OMNIPAQUE) 350 MG/ML injection 100 mL (100 mLs Intravenous Contrast Given 12/28/19 1911)    ED Course  I have reviewed the triage vital signs and the nursing notes.  Pertinent labs & imaging results that were available during my care of the patient were reviewed by me and considered in my medical decision making (see chart  for details).    MDM  Rules/Calculators/A&P                      64 year old female with history stated above presenting to the emergency department with shortness of breath and fatigue.  On arrival hemodynamically stable, patient was hypoxic on room air in triage, in the mid 60s, however rebounded well with 3 L nasal cannula, currently satting in the upper 90s.  Patient is in no respiratory distress, does not appear labored, breathing at a normal rate.  Patient does have some rales bilaterally, and increased lower extremity swelling, could be consistent with CHF exacerbation, no wheezing noted, no cough, doubt COPD at this time.  Could be PE as well, as well as Covid, CT scan obtained of the chest.  EKG with no signs of acute ischemia, doubt ACS at this time.  However given her history she has high risk, troponin added as well given patient did have some chest pain.  Basic labs obtained, chest x-ray obtained as well, BNP, given 40 mg Lasix IV.  Likely will need to come into the hospital.  Chest x-ray with some bibasilar atelectasis, however not overtly with pulmonary edema, PE study negative for acute PE.  BNP elevated.  Likely secondary to CHF exacerbation, will need admission secondary to new oxygen requirement as well as continue diuresis.  Given IV Lasix in the ED.  Labs otherwise reviewed and reassuring, troponin elevated likely secondary to demand, doubt ACS given normal EKG and patient not having any chest pain. Admitted in stable condition.   The attending physician was present and available for all medical decision making and procedures related to this patient's care.    Final Clinical Impression(s) / ED Diagnoses Final diagnoses:  Hypoxia  Other congestive heart failure [facility])    Rx / DC Orders ED Discharge Orders    None       Kizzie Fantasia, MD 12/28/19 1946    Quintella Reichert, MD 12/28/19 2013

## 2019-12-28 NOTE — ED Triage Notes (Signed)
Pt here for shob with exertion x 1 week. Pt wears 2L home O2 at night but none during the day. O2 51% at PCP, they advised ambulance transport to ED. Pt declined, 64% on room air on arrival to ED, lips tinted blue, no visible distress. Pt placed on NRB with improvement to 97%. Hx COPD.

## 2019-12-28 NOTE — ED Notes (Signed)
?  Tele  [phone] pts niece Alyse Low, would like an update

## 2019-12-28 NOTE — H&P (Signed)
MILAYAH KRELL Harrington:[id number] DOB: 07/11/1956 DOA: 12/28/2019     PCP: Cyndi Bender, PA-C   Outpatient Specialists:   CARDS:   Dr. Percival Spanish    Patient arrived to ER on 12/28/19 at [facility]  Patient coming from: home Lives  With family    Chief Complaint:  Chief Complaint  Patient presents with  . Shortness of Breath    HPI: [name] is a 64 y.o. female with medical history significant of COPD, on home 2 L at night, hyperlipidemia, hypertension, CHF,    Presented with   2-week history of shortness of breath dyspnea on exertion and orthopnea leg swelling Patient is already on Lasix for known history of CHF No associated chest pain She presented primary care provider today noted to be hypoxic in the mid 50s on room air Although she did not feel any shortness of breath she was instructed to come to emergency department And on arrival was satting  mid 60s on room air Denies any fevers or chills none no green sputum no hemoptysis no hematemesis no abdominal pain nausea vomiting or diarrhea no sick contacts no recent travel  No sick contacts  Reports leg swelling Reports she probably skipped her lasix  She still smokes but interested in quitting request nicotine patch  Infectious risk factors:  Reports  shortness of breath, dry cough, chest pain, Sore throat,       severe fatigue      In  ER RAPID COVID TEST  NEGATIVE   in house  PCR testing  Negative  Lab Results  Component Value Date   [place] NEGATIVE 12/28/2019     Regarding pertinent Chronic problems    Hyperlipidemia -  on   fenofibrate   HTN on Norvasc, COzaar   chronic CHF diastolic  - last echo normal EF August 2019 On Lasix as needed    Depression on BuSpar and Abilify antibiotic Cipro   DM 2 -  Lab Results  Component Value Date   HGBA1C 5.5 01/11/2013    diet controlled   Hypothyroidism:  Lab Results  Component Value Date   TSH 1.075 04/27/2013   on synthroid     2012 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 DG Chest Portable 1 View  . CT Angio Chest PE W and/or Wo Contrast  . CBC with Differential  . Comprehensive metabolic panel  . Urinalysis, Routine w reflex microscopic  . Brain natriuretic peptide  . Protime-INR  . Consult to hospitalist  ALL PATIENTS BEING ADMITTED/HAVING PROCEDURES NEED COVID-19 SCREENING  . Airborne and Contact precautions  . POC SARS Coronavirus 2 Ag-ED - Nasal Swab (BD Veritor Kit)  . EKG 12-Lead    Following Medications were ordered in ER: Medications  oxyCODONE-acetaminophen (PERCOCET/ROXICET) 5-325 MG per tablet 2 tablet (has no administration in time range)  furosemide (LASIX) injection 40 mg (40 mg Intravenous Given 12/28/19 1648)  iohexol (OMNIPAQUE) 350 MG/ML  injection 100 mL (100 mLs Intravenous Contrast Given 12/28/19 1911)        Consult Orders  (From admission, onward)         Start     Ordered   12/28/19 1935  Consult to hospitalist  ALL PATIENTS BEING ADMITTED/HAVING PROCEDURES NEED COVID-19 SCREENING PAGED [facility]  Once    Comments: ALL PATIENTS BEING ADMITTED/HAVING PROCEDURES NEED COVID-19 SCREENING  Provider:  (Not yet assigned)  Question Answer Comment  Place call to: [facility]   Reason for Consult Admit      12/28/19 1934          Significant initial  Findings: Abnormal Labs Reviewed  CBC WITH DIFFERENTIAL/PLATELET - Abnormal; Notable for the following components:      Result Value   RBC 5.81 (*)     Hemoglobin 16.9 (*)    HCT 53.6 (*)    RDW 19.3 (*)    All other components within normal limits  COMPREHENSIVE METABOLIC PANEL - Abnormal; Notable for the following components:   Sodium 132 (*)    Chloride 94 (*)    Glucose, Bld 104 (*)    Creatinine, Ser 1.02 (*)    Alkaline Phosphatase 23 (*)    Total Bilirubin 1.5 (*)    GFR calc non Af Amer 59 (*)    All other components within normal limits  URINALYSIS, ROUTINE W REFLEX MICROSCOPIC - Abnormal; Notable for the following components:   Color, Urine STRAW (*)    Specific Gravity, Urine 1.004 (*)    All other components within normal limits  BRAIN NATRIURETIC PEPTIDE - Abnormal; Notable for the following components:   B Natriuretic Peptide 421.1 (*)    All other components within normal limits  TROPONIN I (HIGH SENSITIVITY) - Abnormal; Notable for the following components:   Troponin I (High Sensitivity) 56 (*)    All other components within normal limits  TROPONIN I (HIGH SENSITIVITY) - Abnormal; Notable for the following components:   Troponin I (High Sensitivity) 60 (*)    All other components within normal limits     Otherwise labs showing:    Recent Labs  Lab 12/28/19 1632  NA 132*  K 4.5  CO2 25  GLUCOSE 104*  BUN 9  CREATININE 1.02*  CALCIUM 9.0    Cr  stable,    Lab Results  Component Value Date   CREATININE 1.02 (H) 12/28/2019   CREATININE 0.91 04/15/2014   CREATININE 1.70 (H) 04/14/2014    Recent Labs  Lab 12/28/19 1632  AST 27  ALT 16  ALKPHOS 23*  BILITOT 1.5*  PROT 7.1  ALBUMIN 3.7   Lab Results  Component Value Date   CALCIUM 9.0 12/28/2019   PHOS 2.9 04/15/2014      WBC      Component Value Date/Time   WBC 8.2 12/28/2019 1632   ANC    Component Value Date/Time   NEUTROABS 5.9 12/28/2019 1632   ALC No components found for: LYMPHAB     Plt: Lab Results  Component Value Date   PLT 195 12/28/2019    Lactic Acid, Venous    Component Value Date/Time   LATICACIDVEN 1.7  04/14/2014 1435      HG/HCT  Stable,     Component Value Date/Time   HGB 16.9 (H) 12/28/2019 1632   HCT 53.6 (H) 12/28/2019 1632     Troponin 50-60 ordered     ECG: Ordered Personally reviewed by me showing: HR : 80 Rhythm:  NSR,  no evidence of ischemic changes QTC 422   BNP (last 3 results) Recent Labs    12/28/19 1633  BNP 421.1*       UA    no evidence of UTI       Urine analysis:    Component Value Date/Time   COLORURINE STRAW (A) 12/28/2019 1845   APPEARANCEUR CLEAR 12/28/2019 1845   LABSPEC 1.004 (L) 12/28/2019 1845   PHURINE 5.0 12/28/2019 1845   GLUCOSEU NEGATIVE 12/28/2019 1845   HGBUR NEGATIVE 12/28/2019 1845   BILIRUBINUR NEGATIVE 12/28/2019 1845   KETONESUR NEGATIVE 12/28/2019 1845   PROTEINUR NEGATIVE 12/28/2019 1845   UROBILINOGEN 0.2 04/14/2014 1217   NITRITE NEGATIVE 12/28/2019 1845   LEUKOCYTESUR NEGATIVE 12/28/2019 1845        Ordered   CXR - possible infiltrates in the lung bases pulmonary hypertension mild cardiomegaly    CTA chest -  nonacute, no PE,  no evidence of infiltrate  atelectasis and/or scarring in the right upper lobe, right middle lobe and lingula.    ED Triage Vitals  Enc Vitals Group     BP 12/28/19 1616 (!) 173/85     Pulse Rate 12/28/19 1616 81     Resp 12/28/19 1616 18     Temp 12/28/19 1616 98.7 F (37.1 C)     Temp Source 12/28/19 1616 Oral     SpO2 12/28/19 1616 96 %     Weight --      Height --      Head Circumference --      Peak Flow --      Pain Score 12/28/19 1612 5     Pain Loc --      Pain Edu? --      Excl. in [place]? --   TMAX(24)@       Latest  Blood pressure (!) 175/87, pulse 73, temperature 98.7 F (37.1 C), temperature source Oral, resp. rate 16, SpO2 100 %.    Hospitalist was called for admission for CHF exacerbation, acute respiratory failure with hypoxia   Review of Systems:    Pertinent positives include: fatigue,  shortness of breath at rest. dyspnea on exertion Bilateral  lower extremity swelling   Constitutional:  No weight loss, night sweats, Fevers, chills,  weight loss  HEENT:  No headaches, Difficulty swallowing,Tooth/dental problems,Sore throat,  No sneezing, itching, ear ache, nasal congestion, post nasal drip,  Cardio-vascular:  No chest pain, Orthopnea, PND, anasarca, dizziness, palpitations   GI:  No heartburn, indigestion, abdominal pain, nausea, vomiting, diarrhea, change in bowel habits, loss of appetite, melena, blood in stool, hematemesis Resp:  no, No excess mucus, no productive cough, No non-productive cough, No coughing up of blood.No change in color of mucus. No wheezing. Skin:  no rash or lesions. No jaundice GU:  no dysuria, change in color of urine, no urgency or frequency. No straining to urinate.  No flank pain.  Musculoskeletal:  No joint pain or no joint swelling. No decreased range of motion. No back pain.  Psych:  No change in mood or affect. No depression or anxiety. No memory loss.  Neuro: no localizing neurological complaints, no tingling, no weakness, no double vision, no gait abnormality, no slurred speech, no confusion  All systems reviewed and apart from [place] all are negative  Past Medical History:   Past Medical History:  Diagnosis Date  . Anxiety   . Arthritis   . COPD (chronic obstructive pulmonary disease) (HCC)    no inhalers--smoker,  no oxygen  . Depression   . Diabetes mellitus    no meds-diet controlled  . GERD (gastroesophageal reflux disease)   . Hyperlipidemia   . Hypertension   . Hypothyroid   . Restless leg syndrome   . Seizures ([place])    02/13/2011 -hx of seizure due to "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  no seizure since--pt does take keppra and office note from neurologist dr. Leta Baptist on this chart      Past Surgical History:  Procedure Laterality Date  . CARPAL TUNNEL RELEASE      left  . JOINT REPLACEMENT  08/26/2011    right total knee arthroplasty  . KNEE ARTHROPLASTY  10/28/2011   Procedure: COMPUTER ASSISTED TOTAL KNEE ARTHROPLASTY;  Surgeon: Karen Chafe Rendall III;  Location: [facility];  Service: Orthopedics;  Laterality: Left;  preop femoral nerve block  . KNEE SURGERY     right  . laminectomy and diskectomy     L4-5 with fusion    Social History:  Ambulatory   Cane,      reports that she has been smoking cigarettes. She has a 40.00 pack-year smoking history. She has never used smokeless tobacco. She reports current alcohol use. She reports that she does not use drugs.   Family History:   Family History  Problem Relation Age of Onset  . Heart attack Father 32  . Alzheimer's disease Mother   . Breast cancer Neg Hx     Allergies: Allergies  Allergen Reactions  . Acetaminophen Other (See Comments)    Kidney issues  . Aspirin Other (See Comments)    Dr. Michela Pitcher do not take due to kidneys  . Nsaids Other (See Comments)    Pt states it messes up her kidneys  . Vicodin [Hydrocodone-Acetaminophen] Nausea And Vomiting     Prior to Admission medications   Medication Sig Start Date End Date Taking? Authorizing Provider  albuterol (PROVENTIL HFA;VENTOLIN HFA) 108 (90 BASE) MCG/ACT inhaler Inhale 2 puffs into the lungs every 6 (six) hours as needed. For shortness of breath/wheezing.    [provider]  amLODipine (NORVASC) 5 MG tablet Take 5 mg by mouth daily.  07/04/17   [provider]  ARIPiprazole (ABILIFY) 10 MG tablet Take 10 mg by mouth daily.  07/28/17   [provider]  ASPIRIN 81 PO Take by mouth daily.    [provider]  beta carotene 25000 UNIT capsule Take 25,000 Units by mouth daily.    [provider]  busPIRone (BUSPAR) 30 MG tablet Take 30 mg by mouth 2 (two) times daily.    [provider]  escitalopram (LEXAPRO) 20 MG tablet Take 20 mg by mouth every morning.     [provider]    fenofibrate micronized (LOFIBRA) 134 MG capsule Take 134 mg by mouth daily before breakfast.     [provider]  furosemide (LASIX) 40 MG tablet Take 40 mg by mouth daily as needed for fluid or edema.     [provider]  gabapentin (NEURONTIN) 800 MG tablet Take 800 mg by mouth 3 (three) times daily.  06/23/17   [provider]  hydrOXYzine (ATARAX/VISTARIL) 25 MG tablet Take 25 mg by mouth every 6 (six) hours as needed for anxiety or itching.  07/28/17   [provider]  ibuprofen (ADVIL,MOTRIN) 200 MG tablet Take 200 mg by mouth every  6 (six) hours as needed for moderate pain.    [provider]  levETIRAcetam (KEPPRA) 500 MG tablet Take 1 tablet (500 mg total) by mouth 2 (two) times daily. 04/18/14   Kelvin Cellar, MD  levothyroxine (SYNTHROID, LEVOTHROID) 25 MCG tablet Take 25 mcg by mouth daily before breakfast.  06/09/17   [provider]  losartan (COZAAR) 100 MG tablet Take 100 mg by mouth daily.  07/20/17   [provider]  nortriptyline (PAMELOR) 75 MG capsule Take 75 mg by mouth at bedtime.  07/27/17   [provider]  pantoprazole (PROTONIX) 40 MG tablet Take 40 mg by mouth every morning.  08/16/13   [provider]  ranitidine (ZANTAC) 150 MG tablet Take 150 mg by mouth 2 (two) times daily.  06/24/17   [provider]   Physical Exam: Blood pressure (!) 175/87, pulse 73, temperature 98.7 F (37.1 C), temperature source Oral, resp. rate 16, SpO2 100 %. 1. General:  in No  Acute distress   Chronically ill -appearing 2. Psychological: Alert and  Oriented 3. Head/ENT:   Moist  Mucous Membranes                          Head Non traumatic, neck supple                           Poor Dentition 4. SKIN: normal   Skin turgor,  Skin clean Dry and intact no rash 5. Heart: Regular rate and rhythm no  Murmur, no Rub or gallop 6. Lungs:   no wheezes or crackles   7. Abdomen: Soft,  non-tender, Non distended  bowel sounds present 8. Lower extremities: no clubbing, cyanosis, 2+ edema 9. Neurologically Grossly intact, moving all 4 extremities equally  10. MSK: Normal range of motion   All other LABS:     Recent Labs  Lab 12/28/19 1632  WBC 8.2  NEUTROABS 5.9  HGB 16.9*  HCT 53.6*  MCV 92.3  PLT 195     Recent Labs  Lab 12/28/19 1632  NA 132*  K 4.5  CL 94*  CO2 25  GLUCOSE 104*  BUN 9  CREATININE 1.02*  CALCIUM 9.0     Recent Labs  Lab 12/28/19 1632  AST 27  ALT 16  ALKPHOS 23*  BILITOT 1.5*  PROT 7.1  ALBUMIN 3.7       Cultures:    Component Value Date/Time   SDES URINE, CATHETERIZED 04/14/2014 1217   SPECREQUEST NONE 04/14/2014 1217   CULT  04/14/2014 1217    ESCHERICHIA COLI Performed at [facility] 04/16/2014 FINAL 04/14/2014 1217     Radiological Exams on Admission: CT Angio Chest PE W and/or Wo Contrast  Result Date: 12/28/2019 CLINICAL DATA:  Hypoxemia EXAM: CT ANGIOGRAPHY CHEST WITH CONTRAST TECHNIQUE: Multidetector CT imaging of the chest was performed using the standard protocol during bolus administration of intravenous contrast. Multiplanar CT image reconstructions and MIPs were obtained to evaluate the vascular anatomy. CONTRAST:  134m OMNIPAQUE IOHEXOL 350 MG/ML SOLN COMPARISON:  09/09/2016 FINDINGS: Cardiovascular: No filling defects in the pulmonary arteries to suggest pulmonary emboli. Heart is borderline enlarged. Aorta is normal caliber. Scattered coronary artery and aortic calcifications. Mediastinum/Nodes: No mediastinal, hilar, or axillary adenopathy. Trachea and esophagus are unremarkable. Thyroid unremarkable dural Lungs/Pleura: Scarring or atelectasis in the peripheral right upper lobe, right middle lobe and lingula. No confluent opacities  or effusions. Upper Abdomen: Imaging into the upper abdomen shows no acute findings. Musculoskeletal: Chest wall soft tissues are unremarkable. No acute bony abnormality. Review of  the MIP images confirms the above findings. IMPRESSION: . no evidence of pulmonary embolus. Mild cardiomegaly.  Coronary artery disease. Areas of atelectasis and/or scarring in the right upper lobe, right middle lobe and lingula. Aortic Atherosclerosis (ICD10-I70.0). Electronically Signed   By: Rolm Baptise M.D.   On: 12/28/2019 19:25   DG Chest Portable 1 View  Result Date: 12/28/2019 CLINICAL DATA:  64 year old female with hypoxia EXAM: PORTABLE CHEST 1 VIEW COMPARISON:  .  Chest radiograph date 07/14/2018. FINDINGS: Bilateral mid to lower lung field mild interstitial nodularity noted. Developing pneumonia, atypical or viral is not excluded clinical correlation is recommended. No lobar consolidation, pleural effusion, pneumothorax. There is mild cardiomegaly. There is prominence of the central pulmonary arteries suggestive of a degree of pulmonary hypertension. Atherosclerotic calcification of the aortic arch. No acute osseous pathology. IMPRESSION: 1. Findings concerning for developing infiltrate at the lung bases, possibly viral or atypical etiology. Clinical correlation recommended. 2. Mild cardiomegaly with findings suggestive of pulmonary hypertension. Electronically Signed   By: Anner Crete M.D.   On: 12/28/2019 17:09    Chart has been reviewed    Assessment/Plan   64 y.o. female with medical history significant of COPD, on home 2 L at night, hyperlipidemia, hypertension, CHF,  Admitted for CHF exacerbation   Present on Admission: . Diastolic CHF, acute on chronic (HCC) -  - admit on telemetry,  cycle cardiac enzymes, Troponin elevated up to 50   obtain serial ECG  to evaluate for ischemia as a cause of heart failure  monitor daily weight:  Last BNP BNP (last 3 results) Recent Labs    12/28/19 1633  BNP 421.1*      diurese with IV lasix and monitor orthostatics and creatinine to avoid over diuresis.  Order echogram to evaluate EF and valves  ACE/ARBi ordered      Elevated troponin - Patient currently denies any chest pain  Probably demand ischemia in the setting of diastolic CHF  - EKG without evidence of acute ischemia  - Admitted to telemetry   - continue to cycle cardiac enzymes   - obtain echogram in AM  - cardiology consult    . Hypoxemia secondary to CHF exacerbation  . COPD (chronic obstructive pulmonary disease) (HCC) chronic stable continue home medications encouraged tobacco cessation  . HTN (hypertension) -chronic stable continue home medications   . Acute respiratory failure with hypoxia (HCC) felt to be most likely secondary to CHF exacerbation but given some infectious symptoms such as sore throat will obtain Covid PCR   . Chronic pain syndrome secondary to . Lumbar spondylosis - continue home medications currently stable   Diabetes mellitus diet controlled -  Order Sensitive  SSI   -  check TSH and HgA1C    Chronic hypothyroidism - - Check TSH continue home medications at current dose  hx of seizures -continue Keppra  Other plan as per orders.  DVT prophylaxis  Lovenox     Code Status:  FULL CODE   as per patient   I had personally discussed CODE STATUS with patient    Family Communication:   Family not at  Bedside    Disposition Plan:      To home once workup is complete and patient is stable  Consults called:    Please consult cardiology if severe ECHO abnormalities  Admission status:  ED Disposition    None         inpatient     Expect 2 midnight stay secondary to severity of patient's current illness including   hemodynamic instability despite optimal treatment ( hypoxia )   Severe lab/radiological/exam abnormalities including:    sever hypoxia  and extensive comorbidities including:  DM2   CHF .    That are currently affecting medical management.   I expect  patient to be hospitalized for 2 midnights requiring inpatient medical care.  Patient is at  high risk for adverse outcome (such as loss of life or disability) if not treated.  Indication for inpatient stay as follows:    Hemodynamic instability despite maximal medical therapy,    New or worsening hypoxia  Need for IV diuretics    Level of care     tele  For 24H      Precautions: admitted as COVID negative  No active isolations    PPE: Used by the provider:   P100  eye Goggles,  Gloves    Jeslyn Amsler 12/28/2019, 9:44 PM    [facility]     after 2 AM please page floor coverage PA If 7AM-7PM, please contact the day team taking care of the patient using [email or web address]

## 2019-12-29 ENCOUNTER — Inpatient Hospital Stay (HOSPITAL_COMMUNITY): Payer: Medicare Other

## 2019-12-29 DIAGNOSIS — R0602 Shortness of breath: Secondary | ICD-10-CM

## 2019-12-29 DIAGNOSIS — M47816 Spondylosis without myelopathy or radiculopathy, lumbar region: Secondary | ICD-10-CM

## 2019-12-29 LAB — COMPREHENSIVE METABOLIC PANEL
ALT: 14 U/L (ref 0–44)
AST: 22 U/L (ref 15–41)
Albumin: 3.1 g/dL — ABNORMAL LOW (ref 3.5–5.0)
Alkaline Phosphatase: 21 U/L — ABNORMAL LOW (ref 38–126)
Anion gap: 7 (ref 5–15)
BUN: 8 mg/dL (ref 8–23)
CO2: 35 mmol/L — ABNORMAL HIGH (ref 22–32)
Calcium: 8.8 mg/dL — ABNORMAL LOW (ref 8.9–10.3)
Chloride: 95 mmol/L — ABNORMAL LOW (ref 98–111)
Creatinine, Ser: 1.02 mg/dL — ABNORMAL HIGH (ref 0.44–1.00)
GFR calc Af Amer: >60 mL/min (ref >60)
GFR calc non Af Amer: 59 mL/min — ABNORMAL LOW (ref >60)
Glucose, Bld: 109 mg/dL — ABNORMAL HIGH (ref 70–99)
Potassium: 3.6 mmol/L (ref 3.5–5.1)
Sodium: 137 mmol/L (ref 135–145)
Total Bilirubin: 1.2 mg/dL (ref 0.3–1.2)
Total Protein: 6.3 g/dL — ABNORMAL LOW (ref 6.5–8.1)

## 2019-12-29 LAB — CBG MONITORING, ED: Glucose-Capillary: 112 mg/dL — ABNORMAL HIGH (ref 70–99)

## 2019-12-29 LAB — BLOOD GAS, ARTERIAL
Acid-Base Excess: 12.3 mmol/L — ABNORMAL HIGH (ref 0.0–2.0)
Acid-Base Excess: 14.2 mmol/L — ABNORMAL HIGH (ref 0.0–2.0)
Bicarbonate: 38.4 mmol/L — ABNORMAL HIGH (ref 20.0–28.0)
Bicarbonate: 40.1 mmol/L — ABNORMAL HIGH (ref 20.0–28.0)
Drawn by: 44135
FIO2: 44
FIO2: 50
O2 Saturation: 88.7 %
O2 Saturation: 92 %
Patient temperature: 37
Patient temperature: 37
pCO2 arterial: 73.4 mmHg (ref 32.0–48.0)
pCO2 arterial: 71.5 mmHg (ref 32.0–48.0)
pH, Arterial: 7.338 — ABNORMAL LOW (ref 7.350–7.450)
pH, Arterial: 7.368 (ref 7.350–7.450)
pO2, Arterial: 58.1 mmHg — ABNORMAL LOW (ref 83.0–108.0)
pO2, Arterial: 67.4 mmHg — ABNORMAL LOW (ref 83.0–108.0)

## 2019-12-29 LAB — HIV ANTIBODY (ROUTINE TESTING W REFLEX): HIV Screen 4th Generation wRfx: NONREACTIVE

## 2019-12-29 LAB — GLUCOSE, CAPILLARY
Glucose-Capillary: 111 mg/dL — ABNORMAL HIGH (ref 70–99)
Glucose-Capillary: 147 mg/dL — ABNORMAL HIGH (ref 70–99)
Glucose-Capillary: 94 mg/dL (ref 70–99)
Glucose-Capillary: 87 mg/dL (ref 70–99)

## 2019-12-29 LAB — HEMOGLOBIN A1C
Hgb A1c MFr Bld: 5.7 % — ABNORMAL HIGH (ref 4.8–5.6)
Mean Plasma Glucose: 116.89 mg/dL

## 2019-12-29 LAB — CBC
HCT: 51.3 % — ABNORMAL HIGH (ref 36.0–46.0)
Hemoglobin: 16.2 g/dL — ABNORMAL HIGH (ref 12.0–15.0)
MCH: 28.7 pg (ref 26.0–34.0)
MCHC: 31.6 g/dL (ref 30.0–36.0)
MCV: 91 fL (ref 80.0–100.0)
Platelets: 176 10*3/uL (ref 150–400)
RBC: 5.64 MIL/uL — ABNORMAL HIGH (ref 3.87–5.11)
RDW: 19.2 % — ABNORMAL HIGH (ref 11.5–15.5)
WBC: 6.7 10*3/uL (ref 4.0–10.5)
nRBC: 0 % (ref 0.0–0.2)

## 2019-12-29 LAB — ECHOCARDIOGRAM COMPLETE
Height: 66.5 in
Weight: 3343.94 oz

## 2019-12-29 LAB — TROPONIN I (HIGH SENSITIVITY)
Troponin I (High Sensitivity): 58 ng/L — ABNORMAL HIGH (ref <18)
Troponin I (High Sensitivity): 67 ng/L — ABNORMAL HIGH (ref <18)

## 2019-12-29 LAB — SARS CORONAVIRUS 2 (TAT 6-24 HRS): SARS Coronavirus 2: NEGATIVE

## 2019-12-29 LAB — MAGNESIUM: Magnesium: 1.8 mg/dL (ref 1.7–2.4)

## 2019-12-29 LAB — TSH: TSH: 2.978 u[IU]/mL (ref 0.350–4.500)

## 2019-12-29 LAB — PHOSPHORUS: Phosphorus: 4.2 mg/dL (ref 2.5–4.6)

## 2019-12-29 MED ORDER — ACETAMINOPHEN 325 MG PO TABS
650.0000 mg | ORAL_TABLET | Freq: Four times a day (QID) | ORAL | Status: DC | PRN
  Administered 2019-12-29 – 2020-01-01 (×6): 650 mg via ORAL
  Filled 2019-12-29 (×6): qty 2

## 2019-12-29 NOTE — ED Notes (Signed)
Tele   Breakfast ordered  

## 2019-12-29 NOTE — ED Notes (Signed)
Pt given trukey sandwich per Dr. Roel Cluck

## 2019-12-29 NOTE — Evaluation (Signed)
Occupational Therapy Evaluation Patient Details Name: [name] MRN: [id number] DOB: [birth year] Today's Date: 12/29/2019    History of Present Illness 64 y.o. female with medical history significant of COPD, on home 2 L at night, hyperlipidemia, hypertension, CHF. Presented with   2-week history of shortness of breath dyspnea on exertion and orthopnea leg swelling.    Clinical Impression   Pt PTA: pt independent with ADL and mobility. Pt currently Pt limited by tremors, decreased arousal and decreased activity tolerance. Pt requiring supplemental O2 and frequent redirection to attend to tasks. Pt performing ADL with minguardA overall. Pt sitting EOB for LB ADL set-upA. Pt ambulating to commode to perform own pericare with set-upA to minguardA as pt's tremors present.  Pt on 6L O2 >94%; weaning to 4L O2 with standing task and pt desatting to 80s with 8L O2 [place] to recover >2 mins with pursed lip breathing and return to sitting. Pt on 8L O2 with NT in room for vitals; pt satting 89-90% O2. Rn aware of tremors. NT in room checking vitals. Pt would benefit from continued OT skilled services for ADL, mobility and safety in [place] setting. OT following acutely.      Follow Up Recommendations  Home health OT;Supervision/Assistance - 24 hour(initially)    Equipment Recommendations  3 in 1 bedside commode    Recommendations for Other Services       Precautions / Restrictions Precautions Precautions: Fall;Other (comment) Precaution Comments: oxygen Restrictions Weight Bearing Restrictions: No      Mobility Bed Mobility Overal bed mobility: Needs Assistance Bed Mobility: Supine to Sit     Supine to sit: Supervision        Transfers Overall transfer level: Needs assistance Equipment used: None Transfers: Sit to/from Stand Sit to Stand: Supervision              Balance Overall balance assessment: Needs assistance Sitting-balance support: No upper extremity supported;Feet  supported Sitting balance-Leahy Scale: Good Sitting balance - Comments: supervision   Standing balance support: Single extremity supported Standing balance-Leahy Scale: Good Standing balance comment: supervisionA with RW                           ADL either performed or assessed with clinical judgement   ADL Overall ADL's : Needs assistance/impaired Eating/Feeding: Modified independent;Sitting   Grooming: Set up;Sitting   Upper Body Bathing: Set up;Sitting   Lower Body Bathing: Min guard;Sitting/lateral leans;Sit to/from stand;Cueing for safety Lower Body Bathing Details (indicate cue type and reason): (would require increased time) Upper Body Dressing : Set up;Sitting   Lower Body Dressing: Set up;Min guard;Sitting/lateral leans;Sit to/from stand Lower Body Dressing Details (indicate cue type and reason): donning socks with figure 4 technique Toilet Transfer: Min guard;RW;Ambulation   Toileting- Clothing Manipulation and Hygiene: Min guard;Cueing for safety;Sitting/lateral lean;Sit to/from stand       Functional mobility during ADLs: Min guard;Rolling walker;Cueing for safety General ADL Comments: Pt limited by tremors, decreased arousal and decreased activity tolerance     Vision         Perception     Praxis      Pertinent Vitals/Pain Pain Assessment: No/denies pain     Hand Dominance Right   Extremity/Trunk Assessment Upper Extremity Assessment Upper Extremity Assessment: Generalized weakness;RUE deficits/detail;LUE deficits/detail RUE Deficits / Details: tremors present LUE Deficits / Details: tremors present   Lower Extremity Assessment Lower Extremity Assessment: Overall WFL for tasks assessed   Cervical / Trunk  Assessment Cervical / Trunk Assessment: Normal   Communication Communication Communication: No difficulties   Cognition Arousal/Alertness: Awake/alert(waxing and waning) Behavior During Therapy: WFL for tasks  assessed/performed Overall Cognitive Status: Impaired/Different from baseline Area of Impairment: Orientation;Problem solving                 Orientation Level: Disoriented to;Time           Problem Solving: Slow processing General Comments: Pt taking increased time to answer questions   General Comments  Pt on 6L O2 >94%; weaning to 4L O2 with standing task and pt desatting to 80s with 8L O2 [place] to recover >2 mins with pursed lip breathing and return to sitting. Pt on 8L O2 with NT in room for vitals.    Exercises     Shoulder Instructions      Home Living Family/patient expects to be discharged to:: Private residence Living Arrangements: Other relatives(niece) Available Help at Discharge: Available 24 hours/day;Family Type of Home: Mobile home Home Access: Stairs to enter Entrance Stairs-Number of Steps: 3 Entrance Stairs-Rails: Left Home Layout: One level     Bathroom Shower/Tub: Teacher, early years/pre: Standard     Home Equipment: Cane - single point          Prior Functioning/Environment Level of Independence: Independent        Comments: Pt performs own ADL and niece performs IADLs        OT Problem List: Decreased activity tolerance;Impaired balance (sitting and/or standing);Decreased safety awareness      OT Treatment/Interventions: Self-care/ADL training;Therapeutic exercise;Energy conservation;DME and/or AE instruction;Therapeutic activities;Patient/family education;Balance training    OT Goals(Current goals can be found in the care plan section) Acute Rehab OT Goals Patient Stated Goal: To improve mobility OT Goal Formulation: With patient Time For Goal Achievement: 01/12/20 Potential to Achieve Goals: Good ADL Goals Pt Will Perform Grooming: with set-up;standing Pt Will Perform Lower Body Dressing: with modified independence;sitting/lateral leans;sit to/from stand Pt Will Transfer to Toilet: with modified  independence;ambulating Pt Will Perform Toileting - Clothing Manipulation and hygiene: with modified independence;sitting/lateral leans;sit to/from stand Pt/caregiver will Perform Home Exercise Program: Increased strength;Both right and left upper extremity;With Supervision Additional ADL Goal #1: Pt will state 3 energy conservation for ADL/mobility tasks.  OT Frequency: Min 2X/week   Barriers to D/C:            Co-evaluation PT/OT/SLP Co-Evaluation/Treatment: Yes Reason for Co-Treatment: Complexity of the patient's impairments (multi-system involvement);For patient/therapist safety;To address functional/ADL transfers   OT goals addressed during session: ADL's and self-care      AM-PAC OT "6 Clicks" Daily Activity     Outcome Measure Help from another person eating meals?: None Help from another person taking care of personal grooming?: A Little Help from another person toileting, which includes using toliet, bedpan, or urinal?: A Little Help from another person bathing (including washing, rinsing, drying)?: A Little Help from another person to put on and taking off regular upper body clothing?: None Help from another person to put on and taking off regular lower body clothing?: A Little 6 Click Score: 20   End of Session Equipment Utilized During Treatment: Gait belt;Rolling walker Nurse Communication: Mobility status;Other (comment)(RN aware of tremors)  Activity Tolerance: Patient limited by fatigue;Patient limited by lethargy Patient left: in bed;with call bell/phone within reach;with nursing/sitter in room  OT Visit Diagnosis: Unsteadiness on feet (R26.81);Muscle weakness (generalized) (M62.81)  Time: [id number] OT Time Calculation (min): 38 min Charges:  OT General Charges $OT Visit: 1 Visit OT Evaluation $OT Eval Moderate Complexity: 1 Mod OT Treatments $Self Care/Home Management : 8-22 mins  Jefferey Pica OTR/L Acute Rehabilitation Services Pager:  [phone] Office: [phone] C 12/29/2019, 3:41 PM

## 2019-12-29 NOTE — Progress Notes (Signed)
Echocardiogram 2D Echocardiogram has been performed.  [place] Deputy Mariko Nowakowski 12/29/2019, 9:53 AM

## 2019-12-29 NOTE — Progress Notes (Signed)
Patient lethargic and drowsy throughout day shift and beginning of my shift. MD paged and confirmed okay with me holding night time buspirone, gabapentin, and nortriptyline. Ordered repeat ABGs. PCO2 resulted at 71.5, morning PCO2 was 73.4. MD was paged with this result.   Elesa Hacker, RN

## 2019-12-29 NOTE — Evaluation (Signed)
Physical Therapy Evaluation Patient Details Name: [name] MRN: [id number] DOB: [birth year] Today's Date: 12/29/2019   History of Present Illness  64 y.o. female with medical history significant of COPD, on home 2 L at night, hyperlipidemia, hypertension, CHF. Presented with   2-week history of shortness of breath dyspnea on exertion and orthopnea leg swelling.   Clinical Impression  Pt presents to PT with deficits in functional mobility, gait, balance, endurance, cognition, and cardiopulmonary function. Pt limited somewhat by oxygen saturation and tolerance for mobility, but requiring little physical assistance thi session. 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 Pt will benefit from continued acute PT services to improve activity tolerance and reduce falls risk. PT anticipates pt will progress quickly to a home level of mobility and will benefit from use of her cane at home to improve stability.    Follow Up Recommendations Home health PT;Supervision/Assistance - 24 hour(to improve upon pulmonary function)    Equipment Recommendations  None recommended by PT(use of cane at home)    Recommendations for Other Services       Precautions / Restrictions Precautions Precautions: Fall;Other (comment) Precaution Comments: oxygen Restrictions Weight Bearing Restrictions: No      Mobility  Bed Mobility Overal bed mobility: Needs Assistance Bed Mobility: Supine to Sit     Supine to sit: Supervision        Transfers Overall transfer level: Needs assistance Equipment used: None Transfers: Sit to/from Stand Sit to Stand: Supervision            Ambulation/Gait Ambulation/Gait assistance: Min guard Gait Distance (Feet): 20 Feet Assistive device: None(holding onto bedrail when available) Gait Pattern/deviations: Step-to pattern Gait velocity: reduced Gait velocity  interpretation: <1.8 ft/sec, indicate of risk for recurrent falls General Gait Details: pt with short step to gait, downward gaze and utilizing bed rail or other available objects fr UE support  Stairs            Wheelchair Mobility    Modified Rankin (Stroke Patients Only)       Balance Overall balance assessment: Needs assistance Sitting-balance support: No upper extremity supported;Feet supported Sitting balance-Leahy Scale: Good Sitting balance - Comments: supervision   Standing balance support: Single extremity supported Standing balance-Leahy Scale: Good Standing balance comment: close supervision                             Pertinent Vitals/Pain Pain Assessment: No/denies pain    Home Living Family/patient expects to be discharged to:: Private residence Living Arrangements: Other relatives(niece) Available Help at Discharge: Available 24 hours/day;Family Type of Home: Mobile home Home Access: Stairs to enter Entrance Stairs-Rails: Left Entrance Stairs-Number of Steps: 3 Home Layout: One level Home Equipment: Cane - single point      Prior Function Level of Independence: Independent         Comments: Pt performs own ADL and niece performs IADLs     Hand Dominance        Extremity/Trunk Assessment   Upper Extremity Assessment Upper Extremity Assessment: Defer to OT evaluation    Lower Extremity Assessment Lower Extremity Assessment: Overall WFL for tasks assessed    Cervical / Trunk Assessment Cervical / Trunk Assessment: Normal  Communication   Communication: No difficulties  Cognition Arousal/Alertness: Awake/alert(waxing and waning) Behavior During Therapy: WFL for tasks assessed/performed Overall Cognitive Status: Impaired/Different  from baseline Area of Impairment: Orientation;Problem solving                 Orientation Level: Disoriented to;Time           Problem Solving: Slow processing        General  Comments General comments (skin integrity, edema, etc.): pt with resting tremor in BUE and some in trunk noted. Pt on 6L [place] to start session saturating in high 90s. PT attempts to wean to 4L [place], upon which pt desaturates to 80%. Pt requires 8L [place] to recover to low 90s with seated rest and with further mobility. Pt left in bathroom with OT, RN made aware of saturation issues.    Exercises     Assessment/Plan    PT Assessment Patient needs continued PT services  PT Problem List Decreased activity tolerance;Decreased balance;Decreased mobility;Decreased cognition;Decreased knowledge of use of DME;Decreased safety awareness;Decreased knowledge of precautions;Cardiopulmonary status limiting activity       PT Treatment Interventions DME instruction;Gait training;Stair training;Functional mobility training;Therapeutic activities;Therapeutic exercise;Balance training;Neuromuscular re-education;Patient/family education    PT Goals (Current goals can be found in the Care Plan section)  Acute Rehab PT Goals Patient Stated Goal: To improve mobility PT Goal Formulation: With patient Time For Goal Achievement: 01/12/20 Potential to Achieve Goals: Good    Frequency Min 3X/week   Barriers to discharge        Co-evaluation PT/OT/SLP Co-Evaluation/Treatment: Yes Reason for Co-Treatment: Complexity of the patient's impairments (multi-system involvement);Necessary to address cognition/behavior during functional activity;For patient/therapist safety;To address functional/ADL transfers PT goals addressed during session: Mobility/safety with mobility;Balance         AM-PAC PT "6 Clicks" Mobility  Outcome Measure Help needed turning from your back to your side while in a flat bed without using bedrails?: None Help needed moving from lying on your back to sitting on the side of a flat bed without using bedrails?: None Help needed moving to and from a bed to a chair (including a wheelchair)?: None Help  needed standing up from a chair using your arms (e.g., wheelchair or bedside chair)?: None Help needed to walk in hospital room?: A Little Help needed climbing 3-5 steps with a railing? : A Lot 6 Click Score: 21    End of Session Equipment Utilized During Treatment: Oxygen Activity Tolerance: Patient tolerated treatment well Patient left: Other (comment)(in bathroom with OT) Nurse Communication: Mobility status PT Visit Diagnosis: Unsteadiness on feet (R26.81)    Time: [id number] PT Time Calculation (min) (ACUTE ONLY): 24 min   Charges:   PT Evaluation $PT Eval Moderate Complexity: 1 Mod          Zenaida Niece, PT, DPT Acute Rehabilitation Pager: [phone]   Zenaida Niece 12/29/2019, 12:23 PM

## 2019-12-29 NOTE — Progress Notes (Addendum)
PROGRESS NOTE  [name] [id number] DOB: 1956/08/27 DOA: 12/28/2019 PCP: Cyndi Bender, PA-C   LOS: 1 day   Brief narrative: As per HPI,  [name] is a 64 y.o. female with medical history significant of COPD, on home 2 L at night, hyperlipidemia, hypertension, CHF. Patient presented with  2-week history of shortness of breath, dyspnea on exertion and orthopnea with leg swelling. Patient is already on Lasix for known history of CHF. Patient presented to the primary care provider and was noted to be hypoxic in the mid 69s on room air. Patient was saturating mid 60s on room air in the ED. In ED, tested negative for Covid.  elevated troponin in 50s-60. CTA negative for PE felt to have most likely CHF exacerbation. Patient was treated with Lasix 40 mg IV.  Assessment/Plan:  Active Problems:   Hypoxemia   COPD (chronic obstructive pulmonary disease) (HCC)   HTN (hypertension)   Acute respiratory failure with hypoxia (HCC)   Diabetes mellitus, type 2 (HCC)   Diastolic CHF, acute on chronic (HCC)   Chronic pain syndrome   Lumbar spondylosis   CHF exacerbation (HCC)   Elevated troponin  Acute on chronic diastolic congestive heart failure.  Continue telemetry monitor.  Troponins mildly elevated.  EKG unremarkable.   Strict intake and output charting.  Closely monitor.  Check 2D echocardiogram.  Follow renal function closely.  Continue aspirin, Lasix IV, CT angiogram of the chest was negative for pulmonary embolism and no obvious consolidation.  Elevated troponin.  Likely from CHF,demand ischemia from hypoxia. No chest pain.  EKG unremarkable.  Check 2D echocardiogram with preserved LV function without wall motion abnormality..  Cardiology follow-up.  Review of previous records-patient did have myocardial perfusion scan on 07/2018 which showed normal LV function. Continue aspirin.  Acute on chronic hypoxic respiratory failure,history of COPD on 2 L of oxygen at home.  Now with mild  somnolence and on 6 L of oxygen. ABG done showed hypercapnia. wll try BIPAP. Continue oxygen, nebulizers.  Continue diuretic with Lasix IV BID.  Chronic pain syndrome secondary to lumbar spondylosis.  Continue gabapentin  Diabetes mellitus type 2, diet controlled.  Check A1c.  Continue sliding scale insulin, diabetic diet  Hypothyroidism.  Continue Synthroid  History of seizures.  Continue Keppra.  VTE Prophylaxis: Lovenox subq  Code Status: Full code  Family Communication: I spoke with the patient's sister Ms. Majel Homer on the phone and updated her about the clinical condition of the patient.  Patient lives by herself at home. Her sisters' daughter stays with her.  Patient's sister wishes her to come home when she gets better.  Disposition Plan: Home likely in 1-2 days, likely with home health pending clinical improvement.  Will place on BiPAP today.  Consultants: None  Procedures:  BIPAP  Antibiotics:  Anti-infectives (From admission, onward)   None     Subjective:  Today, feeling little better but the nursing staff reported that she has been dozing off quite a bit while eating.  Patient denied chest pain, palpitation, nausea vomiting.  She does have mild cough, on 6 L of oxygen  Objective: Vitals:   12/29/19 0602 12/29/19 0734  BP:    Pulse:    Resp:    Temp:  98.7 F (37.1 C)  SpO2: 91%     Intake/Output Summary (Last 24 hours) at 12/29/2019 0803 Last data filed at 12/29/2019 0737 Gross per 24 hour  Intake -  Output 3250 ml  Net -3250 ml  Filed Weights   12/29/19 0602  Weight: 94.8 kg   Body mass index is 33.23 kg/m.   Physical Exam: GENERAL: Patient is mildly somnolent but able to answer appropriately.  Not in obvious distress.  Obese HENT: No scleral pallor or icterus. Pupils equally reactive to light. Oral mucosa is moist NECK: is supple, no palpable thyroid enlargement. CHEST: Diminished breath sounds bilaterally.  No obvious wheezes noted.  CVS: S1 and S2 heard, no murmur. Regular rate and rhythm. No pericardial rub. ABDOMEN: Soft, non-tender, bowel sounds are present. EXTREMITIES: lower extremity edema+ pitting edema CNS: Cranial nerves are intact. No focal motor or sensory deficits. SKIN: warm and dry without rashes.  Data Review: I have personally reviewed the following laboratory data and studies,  CBC: Recent Labs  Lab 12/28/19 1632 12/29/19 0357  WBC 8.2 6.7  NEUTROABS 5.9  --   HGB 16.9* 16.2*  HCT 53.6* 51.3*  MCV 92.3 91.0  PLT 195 0000000   Basic Metabolic Panel: Recent Labs  Lab 12/28/19 1632 12/29/19 0357  NA 132* 137  K 4.5 3.6  CL 94* 95*  CO2 25 35*  GLUCOSE 104* 109*  BUN 9 8  CREATININE 1.02* 1.02*  CALCIUM 9.0 8.8*  MG  --  1.8  PHOS  --  4.2   Liver Function Tests: Recent Labs  Lab 12/28/19 1632 12/29/19 0357  AST 27 22  ALT 16 14  ALKPHOS 23* 21*  BILITOT 1.5* 1.2  PROT 7.1 6.3*  ALBUMIN 3.7 3.1*   No results for input(s): LIPASE, AMYLASE in the last 168 hours. No results for input(s): AMMONIA in the last 168 hours. Cardiac Enzymes: No results for input(s): CKTOTAL, CKMB, CKMBINDEX, TROPONINI in the last 168 hours. BNP (last 3 results) Recent Labs    12/28/19 1633  BNP 421.1*    ProBNP (last 3 results) No results for input(s): PROBNP in the last 8760 hours.  CBG: Recent Labs  Lab 12/29/19 0002 12/29/19 0732  GLUCAP 112* 111*   Recent Results (from the past 240 hour(s))  SARS CORONAVIRUS 2 (TAT 6-24 HRS) Nasopharyngeal Nasopharyngeal Swab     Status: None   Collection Time: 12/28/19  9:02 PM   Specimen: Nasopharyngeal Swab  Result Value Ref Range Status   SARS Coronavirus 2 NEGATIVE NEGATIVE Final    Comment: (NOTE) SARS-CoV-2 target nucleic acids are NOT DETECTED. The SARS-CoV-2 RNA is generally detectable in upper and lower respiratory specimens during the acute phase of infection. Negative results do not preclude SARS-CoV-2 infection, do not rule out  co-infections with other pathogens, and should not be used as the sole basis for treatment or other patient management decisions. Negative results must be combined with clinical observations, patient history, and epidemiological information. The expected result is Negative. Fact Sheet for Patients: SugarRoll[employer]be Fact Sheet for Healthcare Providers: https://www.woods-mathews.com/ This test is not yet approved or cleared by the [place] FDA and  has been authorized for detection and/or diagnosis of SARS-CoV-2 by FDA under an Emergency Use Authorization (EUA). This EUA will remain  in effect (meaning this test can be used) for the duration of the COVID-19 declaration under Section 56 4(b)(1) of the Act, 21 U.S.C. section 360bbb-3(b)(1), unless the authorization is terminated or revoked sooner. Performed at [facility], [place] [address] [place], [place] [address]      Studies: CT Angio Chest PE W and/or Wo Contrast  Result Date: 12/28/2019 CLINICAL DATA:  Hypoxemia EXAM: CT ANGIOGRAPHY CHEST WITH CONTRAST TECHNIQUE: Multidetector CT  imaging of the chest was performed using the standard protocol during bolus administration of intravenous contrast. Multiplanar CT image reconstructions and MIPs were obtained to evaluate the vascular anatomy. CONTRAST:  152mL OMNIPAQUE IOHEXOL 350 MG/ML SOLN COMPARISON:  09/09/2016 FINDINGS: Cardiovascular: No filling defects in the pulmonary arteries to suggest pulmonary emboli. Heart is borderline enlarged. Aorta is normal caliber. Scattered coronary artery and aortic calcifications. Mediastinum/Nodes: No mediastinal, hilar, or axillary adenopathy. Trachea and esophagus are unremarkable. Thyroid unremarkable dural Lungs/Pleura: Scarring or atelectasis in the peripheral right upper lobe, right middle lobe and lingula. No confluent opacities or effusions. Upper Abdomen: Imaging into the upper abdomen shows no acute  findings. Musculoskeletal: Chest wall soft tissues are unremarkable. No acute bony abnormality. Review of the MIP images confirms the above findings. IMPRESSION: . no evidence of pulmonary embolus. Mild cardiomegaly.  Coronary artery disease. Areas of atelectasis and/or scarring in the right upper lobe, right middle lobe and lingula. Aortic Atherosclerosis (ICD10-I70.0). Electronically Signed   By: Rolm Baptise M.D.   On: 12/28/2019 19:25   DG Chest Portable 1 View  Result Date: 12/28/2019 CLINICAL DATA:  64 year old female with hypoxia EXAM: PORTABLE CHEST 1 VIEW COMPARISON:  .  Chest radiograph date 07/14/2018. FINDINGS: Bilateral mid to lower lung field mild interstitial nodularity noted. Developing pneumonia, atypical or viral is not excluded clinical correlation is recommended. No lobar consolidation, pleural effusion, pneumothorax. There is mild cardiomegaly. There is prominence of the central pulmonary arteries suggestive of a degree of pulmonary hypertension. Atherosclerotic calcification of the aortic arch. No acute osseous pathology. IMPRESSION: 1. Findings concerning for developing infiltrate at the lung bases, possibly viral or atypical etiology. Clinical correlation recommended. 2. Mild cardiomegaly with findings suggestive of pulmonary hypertension. Electronically Signed   By: Anner Crete M.D.   On: 12/28/2019 17:09    Scheduled Meds: . ARIPiprazole  10 mg Oral Daily  . aspirin  81 mg Oral Daily  . busPIRone  30 mg Oral BID  . enoxaparin (LOVENOX) injection  40 mg Subcutaneous Daily  . escitalopram  20 mg Oral q morning - 10a  . famotidine  10 mg Oral Daily  . fenofibrate  54 mg Oral Daily  . furosemide  40 mg Intravenous Q12H  . gabapentin  800 mg Oral TID  . insulin aspart  0-5 Units Subcutaneous QHS  . insulin aspart  0-9 Units Subcutaneous TID WC  . levETIRAcetam  500 mg Oral BID  . levothyroxine  25 mcg Oral QAC breakfast  . losartan  100 mg Oral Daily  . nicotine  21 mg  Transdermal Daily  . nortriptyline  75 mg Oral QHS  . pantoprazole  40 mg Oral q morning - 10a  . sodium chloride flush  3 mL Intravenous Q12H    Continuous Infusions: . sodium chloride       Flora Lipps, MD  [facility] 12/29/2019

## 2019-12-29 NOTE — Progress Notes (Signed)
   12/29/19 1100  BiPAP/CPAP/SIPAP  $ Non-Invasive Ventilator  Non-Invasive Vent Set Up  $ Non-Invasive Home Ventilator  Initial  BiPAP/CPAP/SIPAP Pt Type Adult  Mask Type Full face mask  Mask Size Large  Set Rate 12 breaths/min  Respiratory Rate 16 breaths/min  IPAP 12 cmH20  EPAP 6 cmH2O  Oxygen Percent 40 %  Minute Ventilation 9.6  Leak 17  Peak Inspiratory Pressure (PIP) 13  Tidal Volume (Vt) 737  BiPAP/CPAP/SIPAP BiPAP  Auto Titrate No  Press High Alarm 25 cmH2O  Press Low Alarm 5 cmH2O  Patient placed on BiPAP (settings noted above) per MD order based off ABG results. Patient tolerating well at this time. RT will continue to monitor.

## 2019-12-30 LAB — CBC
HCT: 51.3 % — ABNORMAL HIGH (ref 36.0–46.0)
Hemoglobin: 15.8 g/dL — ABNORMAL HIGH (ref 12.0–15.0)
MCH: 28.1 pg (ref 26.0–34.0)
MCHC: 30.8 g/dL (ref 30.0–36.0)
MCV: 91.1 fL (ref 80.0–100.0)
Platelets: 155 10*3/uL (ref 150–400)
RBC: 5.63 MIL/uL — ABNORMAL HIGH (ref 3.87–5.11)
RDW: 18.4 % — ABNORMAL HIGH (ref 11.5–15.5)
WBC: 5.9 10*3/uL (ref 4.0–10.5)
nRBC: 0 % (ref 0.0–0.2)

## 2019-12-30 LAB — COMPREHENSIVE METABOLIC PANEL
ALT: 12 U/L (ref 0–44)
AST: 20 U/L (ref 15–41)
Albumin: 2.9 g/dL — ABNORMAL LOW (ref 3.5–5.0)
Alkaline Phosphatase: 19 U/L — ABNORMAL LOW (ref 38–126)
Anion gap: 11 (ref 5–15)
BUN: 9 mg/dL (ref 8–23)
CO2: 39 mmol/L — ABNORMAL HIGH (ref 22–32)
Calcium: 8.9 mg/dL (ref 8.9–10.3)
Chloride: 91 mmol/L — ABNORMAL LOW (ref 98–111)
Creatinine, Ser: 0.97 mg/dL (ref 0.44–1.00)
GFR calc Af Amer: >60 mL/min (ref >60)
GFR calc non Af Amer: >60 mL/min (ref >60)
Glucose, Bld: 79 mg/dL (ref 70–99)
Potassium: 3.6 mmol/L (ref 3.5–5.1)
Sodium: 141 mmol/L (ref 135–145)
Total Bilirubin: 1.3 mg/dL — ABNORMAL HIGH (ref 0.3–1.2)
Total Protein: 5.9 g/dL — ABNORMAL LOW (ref 6.5–8.1)

## 2019-12-30 LAB — MAGNESIUM: Magnesium: 1.7 mg/dL (ref 1.7–2.4)

## 2019-12-30 LAB — GLUCOSE, CAPILLARY
Glucose-Capillary: 126 mg/dL — ABNORMAL HIGH (ref 70–99)
Glucose-Capillary: 99 mg/dL (ref 70–99)
Glucose-Capillary: 110 mg/dL — ABNORMAL HIGH (ref 70–99)
Glucose-Capillary: 119 mg/dL — ABNORMAL HIGH (ref 70–99)
Glucose-Capillary: 104 mg/dL — ABNORMAL HIGH (ref 70–99)

## 2019-12-30 MED ORDER — BUDESONIDE 0.25 MG/2ML IN SUSP
0.2500 mg | Freq: Two times a day (BID) | RESPIRATORY_TRACT | Status: DC
  Administered 2020-01-01 – 2020-01-03 (×5): 0.25 mg via RESPIRATORY_TRACT
  Filled 2019-12-30 (×6): qty 2

## 2019-12-30 MED ORDER — BUDESONIDE 0.25 MG/2ML IN SUSP: 0.2500 mg | Freq: Two times a day (BID) | RESPIRATORY_TRACT | Status: DC

## 2019-12-30 NOTE — Progress Notes (Signed)
PROGRESS NOTE  [name] [id number] DOB: 05-10-1956 DOA: 12/28/2019 PCP: Rachel Bender, PA-C   LOS: 2 days   Brief narrative: As per HPI,  [name] is a 64 y.o. female with medical history significant of COPD, on home 2 L at night, hyperlipidemia, hypertension, CHF. Patient presented with  2-week history of shortness of breath, dyspnea on exertion and orthopnea with leg swelling. Patient is already on Lasix for known history of CHF. Patient presented to the primary care provider and was noted to be hypoxic in the mid 53s on room air. Patient was saturating mid 60s on room air in the ED. In ED, tested negative for Covid.  elevated troponin in 50s-60. CTA negative for PE felt to have most likely CHF exacerbation. Patient was treated with Lasix 40 mg IV.  Assessment/Plan:  Active Problems:   Hypoxemia   COPD (chronic obstructive pulmonary disease) (HCC)   HTN (hypertension)   Acute respiratory failure with hypoxia (HCC)   Diabetes mellitus, type 2 (HCC)   Diastolic CHF, acute on chronic (HCC)   Chronic pain syndrome   Lumbar spondylosis   CHF exacerbation (HCC)   Elevated troponin  Acute on chronic diastolic congestive heart failure.  Continue telemetry monitor.  Troponins mildly elevated.  EKG unremarkable.   Strict intake and output charting.  Closely monitor.  2D echocardiogram with LVEF of 60%.  Follow renal function closely.  Continue aspirin, Lasix IV. CT angiogram of the chest was negative for pulmonary embolism and no obvious consolidation.  Elevated troponin.  Likely from CHF,demand ischemia from hypoxia. No chest pain.  EKG unremarkable.  Check 2D echocardiogram with preserved LV function without wall motion abnormality.  Review of previous records-patient did have myocardial perfusion scan on 07/2018 which showed normal LV function. Continue aspirin.  Acute on chronic hypoxic respiratory failure,history of COPD on 2 L of oxygen at home.  Now with mild somnolence and  on 5 L of oxygen saturating 94 pecent. ABG done showed hypercapnia. Continue BIPAP at night and while resting. Continue oxygen, nebulizers.  Continue diuretic with Lasix IV BID.  Chronic pain syndrome secondary to lumbar spondylosis.  Continue gabapentin, will decrease the doses  Diabetes mellitus type 2, diet controlled.  Check A1c.  Continue sliding scale insulin, diabetic diet.  Will closely monitor.  Hypothyroidism.  Continue Synthroid  History of seizures.  Continue Keppra.  VTE Prophylaxis: Lovenox subq  Code Status: Full code  Family Communication: None today. Spoke with  the patient's sister [name] on the phone yesterday.  Disposition Plan: Home likely in 1-2 days, likely with home health, pending clinical improvement.   Consultants: None  Procedures:  BIPAP  Antibiotics:  Anti-infectives (From admission, onward)   None     Subjective:  Today, patient was eating at the time of my evaluation with more alert awake.  Patient was on BiPAP overnight until 3 AM this morning.  Has been transitioned to nasal cannula oxygen.  Denies cough chest pain palpitation.  She does have constipation.  Diuresing well.  Sedatives were on hold yesterday due to somnolence.  Objective: Vitals:   12/30/19 0745 12/30/19 0801  BP:  (!) 112/54  Pulse:  77  Resp:  15  Temp:  98.1 F (36.7 C)  SpO2: 95% 94%    Intake/Output Summary (Last 24 hours) at 12/30/2019 0818 Last data filed at 12/30/2019 0524 Gross per 24 hour  Intake 220 ml  Output 3300 ml  Net -3080 ml   Filed  Weights   12/29/19 0602 12/30/19 0521  Weight: 94.8 kg 94.6 kg   Body mass index is 33.16 kg/m.   Physical Exam: GENERAL: Patient is more alert awake and communicative today.Not in obvious distress.  Morbidly obese.  On nasal cannula 5 L/min. HENT: No scleral pallor or icterus. Pupils equally reactive to light. Oral mucosa is moist NECK: is supple, no palpable thyroid enlargement. CHEST: Diminished  breath sounds bilaterally.   CVS: S1 and S2 heard, no murmur. Regular rate and rhythm. No pericardial rub. ABDOMEN: Soft, non-tender, bowel sounds are present. EXTREMITIES: lower extremity edema+ pitting edema CNS: Cranial nerves are intact. No focal motor or sensory deficits. SKIN: warm and dry without rashes.  Data Review: I have personally reviewed the following laboratory data and studies,  CBC: Recent Labs  Lab 12/28/19 1632 12/29/19 0357 12/30/19 0412  WBC 8.2 6.7 5.9  NEUTROABS 5.9  --   --   HGB 16.9* 16.2* 15.8*  HCT 53.6* 51.3* 51.3*  MCV 92.3 91.0 91.1  PLT 195 176 [id number]   Basic Metabolic Panel: Recent Labs  Lab 12/28/19 1632 12/29/19 0357 12/30/19 0412  NA 132* 137 141  K 4.5 3.6 3.6  CL 94* 95* 91*  CO2 25 35* 39*  GLUCOSE 104* 109* 79  BUN 9 8 9   CREATININE 1.02* 1.02* 0.97  CALCIUM 9.0 8.8* 8.9  MG  --  1.8 1.7  PHOS  --  4.2  --    Liver Function Tests: Recent Labs  Lab 12/28/19 1632 12/29/19 0357 12/30/19 0412  AST 27 22 20   ALT 16 14 12   ALKPHOS 23* 21* 19*  BILITOT 1.5* 1.2 1.3*  PROT 7.1 6.3* 5.9*  ALBUMIN 3.7 3.1* 2.9*   No results for input(s): LIPASE, AMYLASE in the last 168 hours. No results for input(s): AMMONIA in the last 168 hours. Cardiac Enzymes: No results for input(s): CKTOTAL, CKMB, CKMBINDEX, TROPONINI in the last 168 hours. BNP (last 3 results) Recent Labs    12/28/19 1633  BNP 421.1*    ProBNP (last 3 results) No results for input(s): PROBNP in the last 8760 hours.  CBG: Recent Labs  Lab 12/29/19 0732 12/29/19 1142 12/29/19 1637 12/29/19 2130 12/30/19 0800  GLUCAP 111* 147* 94 87 126*   Recent Results (from the past 240 hour(s))  SARS CORONAVIRUS 2 (TAT 6-24 HRS) Nasopharyngeal Nasopharyngeal Swab     Status: None   Collection Time: 12/28/19  9:02 PM   Specimen: Nasopharyngeal Swab  Result Value Ref Range Status   SARS Coronavirus 2 NEGATIVE NEGATIVE Final    Comment: (NOTE) SARS-CoV-2 target nucleic  acids are NOT DETECTED. The SARS-CoV-2 RNA is generally detectable in upper and lower respiratory specimens during the acute phase of infection. Negative results do not preclude SARS-CoV-2 infection, do not rule out co-infections with other pathogens, and should not be used as the sole basis for treatment or other patient management decisions. Negative results must be combined with clinical observations, patient history, and epidemiological information. The expected result is Negative. Fact Sheet for Patients: SugarRoll.be Fact Sheet for Healthcare Providers: https://www.woods-mathews.com/ This test is not yet approved or cleared by the [place] FDA and  has been authorized for detection and/or diagnosis of SARS-CoV-2 by FDA under an Emergency Use Authorization (EUA). This EUA will remain  in effect (meaning this test can be used) for the duration of the COVID-19 declaration under Section 56 4(b)(1) of the Act, 21 U.S.C. section 360bbb-3(b)(1), unless the authorization is terminated or revoked  sooner. Performed at [facility], [place] [address] [place], [place] [address]      Studies: CT Angio Chest PE W and/or Wo Contrast  Result Date: 12/28/2019 CLINICAL DATA:  Hypoxemia EXAM: CT ANGIOGRAPHY CHEST WITH CONTRAST TECHNIQUE: Multidetector CT imaging of the chest was performed using the standard protocol during bolus administration of intravenous contrast. Multiplanar CT image reconstructions and MIPs were obtained to evaluate the vascular anatomy. CONTRAST:  178mL OMNIPAQUE IOHEXOL 350 MG/ML SOLN COMPARISON:  09/09/2016 FINDINGS: Cardiovascular: No filling defects in the pulmonary arteries to suggest pulmonary emboli. Heart is borderline enlarged. Aorta is normal caliber. Scattered coronary artery and aortic calcifications. Mediastinum/Nodes: No mediastinal, hilar, or axillary adenopathy. Trachea and esophagus are unremarkable. Thyroid  unremarkable dural Lungs/Pleura: Scarring or atelectasis in the peripheral right upper lobe, right middle lobe and lingula. No confluent opacities or effusions. Upper Abdomen: Imaging into the upper abdomen shows no acute findings. Musculoskeletal: Chest wall soft tissues are unremarkable. No acute bony abnormality. Review of the MIP images confirms the above findings. IMPRESSION: . no evidence of pulmonary embolus. Mild cardiomegaly.  Coronary artery disease. Areas of atelectasis and/or scarring in the right upper lobe, right middle lobe and lingula. Aortic Atherosclerosis (ICD10-I70.0). Electronically Signed   By: Rolm Baptise M.D.   On: 12/28/2019 19:25   DG Chest Portable 1 View  Result Date: 12/28/2019 CLINICAL DATA:  64 year old female with hypoxia EXAM: PORTABLE CHEST 1 VIEW COMPARISON:  .  Chest radiograph date 07/14/2018. FINDINGS: Bilateral mid to lower lung field mild interstitial nodularity noted. Developing pneumonia, atypical or viral is not excluded clinical correlation is recommended. No lobar consolidation, pleural effusion, pneumothorax. There is mild cardiomegaly. There is prominence of the central pulmonary arteries suggestive of a degree of pulmonary hypertension. Atherosclerotic calcification of the aortic arch. No acute osseous pathology. IMPRESSION: 1. Findings concerning for developing infiltrate at the lung bases, possibly viral or atypical etiology. Clinical correlation recommended. 2. Mild cardiomegaly with findings suggestive of pulmonary hypertension. Electronically Signed   By: Anner Crete M.D.   On: 12/28/2019 17:09   ECHOCARDIOGRAM COMPLETE  Result Date: 12/29/2019   ECHOCARDIOGRAM REPORT   Patient Name:   LAVIDA MAPLES Date of Exam: 12/29/2019 Medical Rec #:  [id number]      Height:       66.5 in Accession #:    [id number]     Weight:       209.0 lb Date of Birth:  Oct 16, 1956       BSA:          2.05 m Patient Age:    12 years       BP:           170/81 mmHg Patient  Gender: F              HR:           86 bpm. Exam Location:  Inpatient Procedure: 2D Echo, Color Doppler and Cardiac Doppler Indications:    Elevated Troponin  History:        Patient has prior history of Echocardiogram examinations, most                 recent 08/07/2018. CHF, COPD; Risk Factors:Hypertension, Diabetes                 and Dyslipidemia.  Sonographer:    Raquel Sarna Senior RDCS Referring Phys: [place]  1. Left ventricular ejection fraction, by visual estimation, is 60 to 65%.  The left ventricle has normal function. There is mildly increased left ventricular hypertrophy.  2. The left ventricle has no regional wall motion abnormalities.  3. Global right ventricle has normal systolic function.The right ventricular size is normal. No increase in right ventricular wall thickness.  4. Left atrial size was mildly dilated.  5. Right atrial size was normal.  6. Mild mitral annular calcification.  7. The mitral valve is normal in structure. No evidence of mitral valve regurgitation. No evidence of mitral stenosis.  8. The tricuspid valve is normal in structure.  9. The aortic valve is tricuspid. Aortic valve regurgitation is not visualized. Mild to moderate aortic valve sclerosis/calcification without any evidence of aortic stenosis. 10. The pulmonic valve was grossly normal. Pulmonic valve regurgitation is trivial. 11. Mildly elevated pulmonary artery systolic pressure. 12. The inferior vena cava is normal in size with greater than 50% respiratory variability, suggesting right atrial pressure of 3 mmHg. FINDINGS  Left Ventricle: Left ventricular ejection fraction, by visual estimation, is 60 to 65%. The left ventricle has normal function. The left ventricle has no regional wall motion abnormalities. There is mildly increased left ventricular hypertrophy. Normal left atrial pressure. Right Ventricle: The right ventricular size is normal. No increase in right ventricular wall thickness.  Global RV systolic function is has normal systolic function. The tricuspid regurgitant velocity is 2.46 m/s, and with an assumed right atrial pressure  of 15 mmHg, the estimated right ventricular systolic pressure is mildly elevated at 39.2 mmHg. Left Atrium: Left atrial size was mildly dilated. Right Atrium: Right atrial size was normal in size Pericardium: There is no evidence of pericardial effusion. Mitral Valve: The mitral valve is normal in structure. There is mild thickening of the mitral valve leaflet(s). There is mild calcification of the mitral valve leaflet(s). Mild mitral annular calcification. No evidence of mitral valve regurgitation. No evidence of mitral valve stenosis by observation. Tricuspid Valve: The tricuspid valve is normal in structure. Tricuspid valve regurgitation is trivial. Aortic Valve: The aortic valve is tricuspid. Aortic valve regurgitation is not visualized. Mild to moderate aortic valve sclerosis/calcification is present, without any evidence of aortic stenosis. Aortic valve mean gradient measures 7.0 mmHg. Aortic valve peak gradient measures 13.5 mmHg. Aortic valve area, by VTI measures 2.51 cm. Pulmonic Valve: The pulmonic valve was grossly normal. Pulmonic valve regurgitation is trivial. Pulmonic regurgitation is trivial. Aorta: The aortic root, ascending aorta and aortic arch are all structurally normal, with no evidence of dilitation or obstruction. Venous: The inferior vena cava is normal in size with greater than 50% respiratory variability, suggesting right atrial pressure of 3 mmHg. IAS/Shunts: No atrial level shunt detected by color flow Doppler. There is no evidence of a patent foramen ovale. No ventricular septal defect is seen or detected. There is no evidence of an atrial septal defect.  LEFT VENTRICLE PLAX 2D LVIDd:         4.20 cm  Diastology LVIDs:         2.70 cm  LV e' lateral:   9.14 cm/s LV PW:         1.20 cm  LV E/e' lateral: 8.7 LV IVS:        1.30 cm  LV e'  medial:    5.55 cm/s LVOT diam:     1.90 cm  LV E/e' medial:  14.4 LV SV:         52 ml LV SV Index:   24.05 LVOT Area:     2.84 cm  RIGHT VENTRICLE RV S prime:     13.40 cm/s TAPSE (M-mode): 2.9 cm LEFT ATRIUM             Index       RIGHT ATRIUM           Index LA diam:        4.10 cm 2.00 cm/m  RA Area:     18.50 cm LA Vol (A2C):   53.9 ml 26.31 ml/m RA Volume:   55.90 ml  27.28 ml/m LA Vol (A4C):   34.2 ml 16.69 ml/m LA Biplane Vol: 44.2 ml 21.57 ml/m  AORTIC VALVE AV Area (Vmax):    2.74 cm AV Area (Vmean):   2.67 cm AV Area (VTI):     2.51 cm AV Vmax:           184.00 cm/s AV Vmean:          123.000 cm/s AV VTI:            0.358 m AV Peak Grad:      13.5 mmHg AV Mean Grad:      7.0 mmHg LVOT Vmax:         178.00 cm/s LVOT Vmean:        116.000 cm/s LVOT VTI:          0.317 m LVOT/AV VTI ratio: 0.89  AORTA Ao Root diam: 2.40 cm Ao Asc diam:  3.10 cm MITRAL VALVE                        TRICUSPID VALVE MV Area (PHT): 2.91 cm             TR Peak grad:   24.2 mmHg MV PHT:        75.69 msec           TR Vmax:        246.00 cm/s MV Decel Time: 261 msec MV E velocity: 79.70 cm/s 103 cm/s  SHUNTS MV A velocity: 71.60 cm/s 70.3 cm/s Systemic VTI:  0.32 m MV E/A ratio:  1.11       1.5       Systemic Diam: 1.90 cm  Jenkins Rouge MD Electronically signed by Jenkins Rouge MD Signature Date/Time: 12/29/2019/9:58:14 AM    Final     Scheduled Meds:  ARIPiprazole  10 mg Oral Daily   aspirin  81 mg Oral Daily   busPIRone  30 mg Oral BID   enoxaparin (LOVENOX) injection  40 mg Subcutaneous Daily   escitalopram  20 mg Oral q morning - 10a   famotidine  10 mg Oral Daily   fenofibrate  54 mg Oral Daily   furosemide  40 mg Intravenous Q12H   gabapentin  800 mg Oral TID   insulin aspart  0-5 Units Subcutaneous QHS   insulin aspart  0-9 Units Subcutaneous TID WC   levETIRAcetam  500 mg Oral BID   levothyroxine  25 mcg Oral QAC breakfast   losartan  100 mg Oral Daily   nicotine  21 mg Transdermal  Daily   nortriptyline  75 mg Oral QHS   pantoprazole  40 mg Oral q morning - 10a   sodium chloride flush  3 mL Intravenous Q12H    Continuous Infusions:  sodium chloride       Flora Lipps, MD  [facility] 12/30/2019

## 2019-12-30 NOTE — Progress Notes (Signed)
RT Note: Came to check on pt for BiPAP. At this time pt is alert and in no distress. RR 18, SPO2 97% on 6L [place]. I told her let RT know if she feels she needs Bipap later

## 2019-12-31 ENCOUNTER — Encounter (HOSPITAL_COMMUNITY): Payer: Self-pay | Admitting: Internal Medicine

## 2019-12-31 DIAGNOSIS — I5033 Acute on chronic diastolic (congestive) heart failure: Secondary | ICD-10-CM

## 2019-12-31 DIAGNOSIS — J9621 Acute and chronic respiratory failure with hypoxia: Secondary | ICD-10-CM

## 2019-12-31 DIAGNOSIS — J9622 Acute and chronic respiratory failure with hypercapnia: Secondary | ICD-10-CM

## 2019-12-31 DIAGNOSIS — J449 Chronic obstructive pulmonary disease, unspecified: Secondary | ICD-10-CM

## 2019-12-31 DIAGNOSIS — Z720 Tobacco use: Secondary | ICD-10-CM

## 2019-12-31 LAB — BRAIN NATRIURETIC PEPTIDE: B Natriuretic Peptide: 175.7 pg/mL — ABNORMAL HIGH (ref 0.0–100.0)

## 2019-12-31 LAB — GLUCOSE, CAPILLARY
Glucose-Capillary: 88 mg/dL (ref 70–99)
Glucose-Capillary: 113 mg/dL — ABNORMAL HIGH (ref 70–99)
Glucose-Capillary: 120 mg/dL — ABNORMAL HIGH (ref 70–99)
Glucose-Capillary: 152 mg/dL — ABNORMAL HIGH (ref 70–99)

## 2019-12-31 NOTE — Consult Note (Signed)
NAME:  [name], MRN:  [id number], DOB:  1956/04/26, LOS: 3 ADMISSION DATE:  12/28/2019, CONSULTATION DATE:  12/31/2019 REFERRING MD:  Pokhrel CHIEF COMPLAINT:  Hypoxia    Brief History   64yo F smoker /w COPD chronic 2lpm O2 dependent, CHF admitted and treated for acute on chronic hypoxic respiratory failure d/t a/c CHF decompensation with increased FiO2 requirements. PCCM consulted for recommendations as patient does not have an out patient pulmonologist.   History of present illness   [name] is a 64yo F PMH 2ppd smoker since age 84, COPD, chronic hypoxic respiratory failure O2 dependent at 2 L/min, diastolic congestive heart failure EF 60-65%, hyperlipidemia, hypertension, chronic pain syndrome, diabetes mellitus, hypothyroidism, history of seizures admitted 12/28/2019 after she presented with a 2-week history of shortness of breath, dyspnea on exertion, orthopnea and lower extremity swelling.  She presented to her primary care provider on that day and was noted to be hypoxic with SPO2 in the 50s on room air though there was no associated shortness of breath.  In the ED she had a minimally elevated troponin, chest x-ray consistent with pulmonary edema, no evidence of ischemia on EKG. She was admitted to hospitalist services and treated for acute on chronic hypoxic respiratory failure thought to be secondary to acute decompensation of diastolic congestive heart failure and found to be hypercarbic requiring BiPAP which was subsequently weaned off.   She has been aggressively diuresed, down 3.4 kg since admission, -8.4 L, echo shows preserved EF of no significant findings.  Bronchodilators, inhaled steroids added.  Despite these treatments she continued to require up to 5 L of oxygen saturating around 92% and desaturating with ambulation.  PCCM consulted for further recommendations, patient does not have an outpatient pulmonologist.    Her primary care provider provides her oxygen. She has been  smoking since she was 31 yoa. 2 dogs live in her home. Her niece lives with her and also smokes. She does not wear O2 at home as she is supposed to.   Past Medical History   Past Medical History:  Diagnosis Date   Anxiety    Arthritis    COPD (chronic obstructive pulmonary disease) (HCC)    no inhalers--smoker, no oxygen   Depression    Diabetes mellitus    no meds-diet controlled   GERD (gastroesophageal reflux disease)    Hyperlipidemia    Hypertension    Hypothyroid    Restless leg syndrome    Seizures ([place])    02/13/2011 -hx of seizure due to "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  no seizure since--pt does take keppra and office note from neurologist dr. Leta Baptist on this chart     [facility] Events   1/15 Admitted   Consults:  PCCM   Procedures:  NA  Significant Diagnostic Tests:  1/16 Echo EF 60 to 65%, no regional wall motion abnormalities, mild increased LV hypertrophy. Mildly elevated RV systolic pressure at 123XX123 mmHg. LA mildly dilated. RA normal. Mitral valve mild annular calcification. TV regurgitation trivial. Mild to moderate aortic valve sclerosis/calcification without stenosis. PV trivial regurgitation. IAS/Shunts: No atrial level shunt detected by color flow Doppler. There is no evidence of a patent foramen ovale. No ventricular septal defect is seen or detected. There is no evidence of an atrial septal defect.  1/15 chest x-ray personally reviewed shows cardiomegaly and possible small infiltrate  1/15 CTA  chest  No evidence of pulmonary embolus. Mild cardiomegaly.  Coronary artery disease. Areas of atelectasis and/or scarring in the right upper lobe, right middle lobe and lingula.  Micro Data:  SARS Coronavirus 2 negative   Antimicrobials:  NA   Interim history/subjective:  Patient sitting in recliner, PT at  bedside preparing to ambulate patient.  She is currently on 5 L/min oxygen.  I decreased to 4 L/min walked with patient and assessed.  During ambulation sats dropped as low as 85%.  She did stop for approximately 2 minutes to allow recovery to sats 88% or greater.  She denies shortness of breath during this time.  She did state she felt "wobbly" but had no other complaints. Became "shaky" after ambulation.  She has been performing IS up to 564ml per pt  Objective   Blood pressure (!) 153/84, pulse 80, temperature 98.3 F (36.8 C), temperature source Oral, resp. rate 15, height 5' 6.5" (1.689 m), weight 91.4 kg, SpO2 92 %.        Intake/Output Summary (Last 24 hours) at 12/31/2019 1338 Last data filed at 12/31/2019 1100 Gross per 24 hour  Intake 465 ml  Output 1850 ml  Net -1385 ml   Filed Weights   12/29/19 0602 12/30/19 0521 12/31/19 0407  Weight: 94.8 kg 94.6 kg 91.4 kg    Examination: General: Chronically ill-appearing, appears much older than chronological age, well-developed adult female able to stand and ambulate with walker assistance HENT: Normocephalic, PERRL. Dry mucus membranes Neck: No JVD. Trachea midline. CV: RRR. S1S2. No MRG. +2 distal pulses Lungs: BBS diminished throughout, no rhonchi or wheezing, dry cough, FNL, symmetrical ABD: +BS x4. SNT/ND. No masses, guarding or rigidity GU: No Foley EXT: MAE well.  Trace edema in the lower extremities Skin: PWD.  Chronic venous stasis changes to lower extremities.  Otherwise intact. Neuro: A&Ox3. CN II-XII in tact. No focal deficits   Resolved Hospital Problem list   NA   Assessment & Plan:  This is a 64 yo female with longstanding tobacco dependence with acute on chronic hypoxic respiratory failure 2/2 acute decompensation of diastolic CHF, appropriately diuresed with increased FiO2 requirements likely d/t poorly controlled COPD. She is afebrile without leukocytosis, doubt infection.  Recommendations: Wean O2 for goal  SpO2 >/=88% She needs to wear O2 at all times at home Continue IS Add flutter valve  Check procal for completeness  BiPAP HS, am ABG CXR in am  Needs to quit smoking Continue to diurese Continue bronchodilators  Will need triple therapy. Will wait until f/u tomorrow to initiate /p assessing response and findings of above  Will need F/U in PCCM clinic. Will set up prior to D/c  Thank you for the consult. We will follow with you.   Labs   CBC: Recent Labs  Lab 12/28/19 1632 12/29/19 0357 12/30/19 0412  WBC 8.2 6.7 5.9  NEUTROABS 5.9  --   --   HGB 16.9* 16.2* 15.8*  HCT 53.6* 51.3* 51.3*  MCV 92.3 91.0 91.1  PLT 195 176 [id number]    Basic Metabolic Panel: Recent Labs  Lab 12/28/19 1632 12/29/19 0357 12/30/19 0412  NA 132* 137 141  K 4.5 3.6 3.6  CL 94* 95* 91*  CO2 25 35* 39*  GLUCOSE 104* 109* 79  BUN 9 8 9   CREATININE 1.02* 1.02* 0.97  CALCIUM 9.0 8.8* 8.9  MG  --  1.8 1.7  PHOS  --  4.2  --    GFR: Estimated Creatinine Clearance:  69.2 mL/min (by C-G formula based on SCr of 0.97 mg/dL). Recent Labs  Lab 12/28/19 1632 12/29/19 0357 12/30/19 0412  WBC 8.2 6.7 5.9    Liver Function Tests: Recent Labs  Lab 12/28/19 1632 12/29/19 0357 12/30/19 0412  AST 27 22 20   ALT 16 14 12   ALKPHOS 23* 21* 19*  BILITOT 1.5* 1.2 1.3*  PROT 7.1 6.3* 5.9*  ALBUMIN 3.7 3.1* 2.9*   No results for input(s): LIPASE, AMYLASE in the last 168 hours. No results for input(s): AMMONIA in the last 168 hours.  ABG    Component Value Date/Time   PHART 7.368 12/29/2019 2120   PCO2ART 71.5 (HH) 12/29/2019 2120   PO2ART 67.4 (L) 12/29/2019 2120   HCO3 40.1 (H) 12/29/2019 2120   TCO2 25.1 04/15/2014 0317   ACIDBASEDEF 1.8 04/15/2014 0317   O2SAT 92.0 12/29/2019 2120     Coagulation Profile: Recent Labs  Lab 12/28/19 1632  INR 1.2    Cardiac Enzymes: No results for input(s): CKTOTAL, CKMB, CKMBINDEX, TROPONINI in the last 168 hours.  HbA1C: Hgb A1c MFr Bld    Date/Time Value Ref Range Status  12/29/2019 03:55 AM 5.7 (H) 4.8 - 5.6 % Final    Comment:    (NOTE) Pre diabetes:          5.7%-6.4% Diabetes:              >6.4% Glycemic control for   <7.0% adults with diabetes   01/11/2013 06:10 AM 5.5 <5.7 % Final    Comment:    (NOTE)                                                                       According to the ADA Clinical Practice Recommendations for 2011, when HbA1c is used as a screening test:  >=6.5%   Diagnostic of Diabetes Mellitus           (if abnormal result is confirmed) 5.7-6.4%   Increased risk of developing Diabetes Mellitus References:Diagnosis and Classification of Diabetes Mellitus,Diabetes [id number] 1):S62-S69 and Standards of Medical Care in         Diabetes - 2011,Diabetes [id number] (Suppl 1):S11-S61.    CBG: Recent Labs  Lab 12/30/19 1646 12/30/19 1713 12/30/19 2117 12/31/19 0736 12/31/19 1137  GLUCAP 119* 110* 104* 88 113*    Review of Systems: positives in bold   Gen: Denies fever, chills, weight change, fatigue, night sweats HEENT: Denies blurred vision, double vision, hearing loss, tinnitus, sinus congestion, rhinorrhea, sore throat, neck stiffness, dysphagia PULM: Denies shortness of breath, cough, sputum production, hemoptysis, wheezing CV: Denies chest pain, edema, orthopnea, paroxysmal nocturnal dyspnea, palpitations GI: Denies abdominal pain, nausea, vomiting, diarrhea, hematochezia, melena, constipation, change in bowel habits, abdominal swelling  GU: Denies dysuria, hematuria, polyuria, oliguria, urethral discharge Endocrine: Denies hot or cold intolerance, polyuria, polyphagia or appetite change Derm: Denies rash, dry skin, scaling or peeling skin change Heme: Denies easy bruising, bleeding, bleeding gums Neuro: Denies headache, numbness, weakness, slurred speech, loss of memory or consciousness   Past Medical History  She,  has a past medical history of Anxiety, Arthritis,  COPD (chronic obstructive pulmonary disease) ([place]), Depression, Diabetes mellitus, GERD (gastroesophageal reflux disease), Hyperlipidemia, Hypertension, Hypothyroid, Restless leg syndrome, and Seizures ([place]).  Surgical History    Past Surgical History:  Procedure Laterality Date   CARPAL TUNNEL RELEASE     left   JOINT REPLACEMENT  08/26/2011    right total knee arthroplasty   KNEE ARTHROPLASTY  10/28/2011   Procedure: COMPUTER ASSISTED TOTAL KNEE ARTHROPLASTY;  Surgeon: Karen Chafe Rendall III;  Location: [facility];  Service: Orthopedics;  Laterality: Left;  preop femoral nerve block   KNEE SURGERY     right   laminectomy and diskectomy     L4-5 with fusion     Social History   reports that she has been smoking cigarettes. She has a 40.00 pack-year smoking history. She has never used smokeless tobacco. She reports current alcohol use. She reports that she does not use drugs.   Family History   Her family history includes Alzheimer's disease in her mother; Heart attack (age of onset: 21) in her father. There is no history of Breast cancer.   Allergies Allergies  Allergen Reactions   Acetaminophen Other (See Comments)    Kidney issues   Aspirin Other (See Comments)    Dr. Michela Pitcher do not take due to kidneys   Nsaids Other (See Comments)    Pt states it messes up her kidneys   Vicodin [Hydrocodone-Acetaminophen] Nausea And Vomiting     Home Medications  Prior to Admission medications   Medication Sig Start Date End Date Taking? Authorizing Provider  albuterol (PROVENTIL HFA;VENTOLIN HFA) 108 (90 BASE) MCG/ACT inhaler Inhale 2 puffs into the lungs every 6 (six) hours as needed. For shortness of breath/wheezing.   Yes [provider]  amLODipine (NORVASC) 5 MG tablet Take 5 mg by mouth daily.  07/04/17  Yes [provider]  ARIPiprazole (ABILIFY) 10 MG tablet Take 10 mg by mouth daily.  07/28/17  Yes [provider]  aspirin (ASPIRIN 81) 81 MG EC tablet  Take 81 mg by mouth daily.    Yes [provider]  beta carotene 25000 UNIT capsule Take 25,000 Units by mouth daily.   Yes [provider]  busPIRone (BUSPAR) 30 MG tablet Take 30 mg by mouth 2 (two) times daily.   Yes [provider]  escitalopram (LEXAPRO) 20 MG tablet Take 20 mg by mouth every morning.    Yes [provider]  fenofibrate micronized (LOFIBRA) 134 MG capsule Take 134 mg by mouth daily before breakfast.    Yes [provider]  furosemide (LASIX) 40 MG tablet Take 40 mg by mouth daily as needed for fluid or edema.    Yes [provider]  gabapentin (NEURONTIN) 800 MG tablet Take 800 mg by mouth 3 (three) times daily.  06/23/17  Yes [provider]  hydrOXYzine (ATARAX/VISTARIL) 25 MG tablet Take 25 mg by mouth every 6 (six) hours as needed for anxiety or itching.  07/28/17  Yes [provider]  ibuprofen (ADVIL,MOTRIN) 200 MG tablet Take 200 mg by mouth every 6 (six) hours as needed for moderate pain.   Yes [provider]  levETIRAcetam (KEPPRA) 500 MG tablet Take 1 tablet (500 mg total) by mouth 2 (two) times daily. 04/18/14  Yes Kelvin Cellar, MD  levothyroxine (SYNTHROID, LEVOTHROID) 25 MCG tablet Take 25 mcg by mouth daily before breakfast.  06/09/17  Yes [provider]  losartan (COZAAR) 100 MG tablet Take 100 mg by mouth daily.  07/20/17  Yes [provider]  nortriptyline (PAMELOR) 75 MG capsule Take 75 mg by mouth at bedtime.  07/27/17  Yes [provider]  oxyCODONE-acetaminophen (PERCOCET) 10-325 MG tablet Take 1 tablet by mouth every 4 (four) hours as needed for pain.   Yes [provider]  pantoprazole (PROTONIX) 40 MG tablet Take 40 mg by mouth every morning.  08/16/13  Yes [provider]     Francine Graven, MSN, [place]

## 2019-12-31 NOTE — Progress Notes (Signed)
Occupational Therapy Treatment Patient Details Name: [name] MRN: [id number] DOB: [birth year] Today's Date: 12/31/2019    History of present illness 64 y.o. female with medical history significant of COPD, on home 2 L at night, hyperlipidemia, hypertension, CHF. Presented with   2-week history of shortness of breath dyspnea on exertion and orthopnea leg swelling.    OT comments  Patient agreeable to OT upon arrival. Pt on 5L O2 throughout session maintaining 88-92% with ADLs in standing, however when bending over bed to fix sheets desat to 84%. With cues for pursed lip breathing and rest in bed increase to 89-90%. Patient min guard for safety and managing lines with grooming/hygiene and UB bathing, min A for LB bathing for safety with balance. Recommend continued acute OT services for energy conservation education, maximize patient independence with self care.    Follow Up Recommendations  Home health OT;Supervision/Assistance - 24 hour    Equipment Recommendations  3 in 1 bedside commode       Precautions / Restrictions Precautions Precautions: Fall;Other (comment) Precaution Comments: oxygen Restrictions Weight Bearing Restrictions: No       Mobility Bed Mobility Overal bed mobility: Needs Assistance Bed Mobility: Supine to Sit;Sit to Supine     Supine to sit: Supervision Sit to supine: Supervision   General bed mobility comments: S for safety/lines  Transfers Overall transfer level: Needs assistance Equipment used: None Transfers: Sit to/from Stand Sit to Stand: Supervision         General transfer comment: S for safety, no physical assist given    Balance Overall balance assessment: Needs assistance Sitting-balance support: No upper extremity supported;Feet supported Sitting balance-Leahy Scale: Good Sitting balance - Comments: supervision   Standing balance support: No upper extremity supported;During functional activity Standing balance-Leahy Scale:  Fair Standing balance comment: min guard with no BUE support                           ADL either performed or assessed with clinical judgement   ADL Overall ADL's : Needs assistance/impaired     Grooming: Min guard;Wash/dry face;Wash/dry hands;Oral care;Brushing hair;Standing Grooming Details (indicate cue type and reason): min G for safety Upper Body Bathing: Min guard;Standing   Lower Body Bathing: Minimal assistance;Sit to/from stand Lower Body Bathing Details (indicate cue type and reason): min A for safety with standing balance to wash feet, lower legs                     Functional mobility during ADLs: Min guard;Cueing for safety General ADL Comments: tremors mild this session, able to participate in standing sink side ADLs on 5L O2 maintaining between 88-92%.               Cognition Arousal/Alertness: Awake/alert Behavior During Therapy: WFL for tasks assessed/performed Overall Cognitive Status: Within Functional Limits for tasks assessed                                 General Comments: WNL today, cognition much improved and participatory and appropriate              General Comments maintain 88-92% on 5L during ADLs, patient leaned over bed to straighten sheets with desat to 84% on 5L, cue for pursed lip breathing while returning to bed and increase to 89-90% on 5L.    Pertinent Vitals/ Pain  Pain Assessment: No/denies pain         Frequency  Min 2X/week        Progress Toward Goals  OT Goals(current goals can now be found in the care plan section)  Progress towards OT goals: Progressing toward goals  Acute Rehab OT Goals Patient Stated Goal: To improve mobility OT Goal Formulation: With patient Time For Goal Achievement: 01/12/20 Potential to Achieve Goals: Good ADL Goals Pt Will Perform Grooming: with modified independence;standing Pt Will Perform Lower Body Dressing: with modified  independence;sitting/lateral leans;sit to/from stand Pt Will Transfer to Toilet: with modified independence;ambulating Pt Will Perform Toileting - Clothing Manipulation and hygiene: with modified independence;sitting/lateral leans;sit to/from stand Pt/caregiver will Perform Home Exercise Program: Increased strength;Both right and left upper extremity;With Supervision Additional ADL Goal #1: Pt will state 3 energy conservation for ADL/mobility tasks.  Plan Discharge plan remains appropriate       AM-PAC OT "6 Clicks" Daily Activity     Outcome Measure   Help from another person eating meals?: None Help from another person taking care of personal grooming?: A Little Help from another person toileting, which includes using toliet, bedpan, or urinal?: A Little Help from another person bathing (including washing, rinsing, drying)?: A Little Help from another person to put on and taking off regular upper body clothing?: None Help from another person to put on and taking off regular lower body clothing?: A Little 6 Click Score: 20    End of Session Equipment Utilized During Treatment: Oxygen  OT Visit Diagnosis: Unsteadiness on feet (R26.81);Muscle weakness (generalized) (M62.81)   Activity Tolerance Patient tolerated treatment well   Patient Left in bed;with call bell/phone within reach;with bed alarm set   Nurse Communication Mobility status        Time: [id number] OT Time Calculation (min): 24 min  Charges: OT General Charges $OT Visit: 1 Visit OT Treatments $Self Care/Home Management : 23-37 mins  [place] OT office: [place] 12/31/2019, 2:01 PM

## 2019-12-31 NOTE — Progress Notes (Signed)
Please see in addition to PT note:   SATURATION QUALIFICATIONS: (This note is used to comply with regulatory documentation for home oxygen)  Patient Saturations on Room Air at Rest = 84%   Patient Saturations on Room Air while Ambulating = did not assess due to desat at rest on room air   Patient Saturations on 2 Liters of oxygen while Ambulating = 81-83%; required increase to 8LPM O2 per [place] to maintain in low 90s with activity   Please briefly explain why patient needs home oxygen:rapid desaturation with activity on room air  Windell Norfolk, DPT, [place]    Pager [place] [phone]

## 2019-12-31 NOTE — Progress Notes (Signed)
On arrival patient was desatted in the low to mid 80's NIV was placed on patient. Patient states that she is ready for some rest. Patient is tolerating NIV. Patient is alert/oriented at this time. Following commands. No distress or further complications noted.

## 2019-12-31 NOTE — Progress Notes (Signed)
Physical Therapy Treatment Patient Details Name: [name] MRN: [id number] DOB: [birth year] Today's Date: 12/31/2019    History of Present Illness 64 y.o. female with medical history significant of COPD, on home 2 L at night, hyperlipidemia, hypertension, CHF. Presented with   2-week history of shortness of breath dyspnea on exertion and orthopnea leg swelling.     PT Comments    Patient received in bed, pleasant and willing to work with skilled PT services this morning. Completed O2 assessment- see separate note for details. Able to perform bed mobility/functional transfers with S, required Min guard for gait approximately 172f for safety with O2 needs at 6-8LPM per [place]. She was left up in the chair with all needs met, NT present and attending, alarm active. Current plan remains appropriate.     Follow Up Recommendations  Home health PT;Supervision/Assistance - 24 hour     Equipment Recommendations  None recommended by PT    Recommendations for Other Services       Precautions / Restrictions Precautions Precautions: Fall;Other (comment) Precaution Comments: oxygen Restrictions Weight Bearing Restrictions: No    Mobility  Bed Mobility Overal bed mobility: Needs Assistance Bed Mobility: Supine to Sit     Supine to sit: Supervision     General bed mobility comments: S for safety/lines  Transfers Overall transfer level: Needs assistance Equipment used: None Transfers: Sit to/from Stand Sit to Stand: Supervision         General transfer comment: S for safety, no physical assist given  Ambulation/Gait Ambulation/Gait assistance: Min guard Gait Distance (Feet): 100 Feet Assistive device: None Gait Pattern/deviations: Step-through pattern;Decreased step length - right;Decreased step length - left;Narrow base of support Gait velocity: reduced   General Gait Details: gait initially mildly unsteady but improved with ongoing mobility; short step lengths but improved  since last session as well   Stairs             Wheelchair Mobility    Modified Rankin (Stroke Patients Only)       Balance Overall balance assessment: Needs assistance   Sitting balance-Leahy Scale: Good Sitting balance - Comments: supervision   Standing balance support: No upper extremity supported Standing balance-Leahy Scale: Fair Standing balance comment: min guard with no BUE support                            Cognition Arousal/Alertness: Awake/alert Behavior During Therapy: WFL for tasks assessed/performed Overall Cognitive Status: Within Functional Limits for tasks assessed                                 General Comments: WNL today, cognition much improved and participatory and appropriate      Exercises      General Comments General comments (skin integrity, edema, etc.): see following SpO2 note for O2 details      Pertinent Vitals/Pain Pain Assessment: No/denies pain    Home Living                      Prior Function            PT Goals (current goals can now be found in the care plan section) Acute Rehab PT Goals Patient Stated Goal: To improve mobility PT Goal Formulation: With patient Time For Goal Achievement: 01/12/20 Potential to Achieve Goals: Good Progress towards PT goals: Progressing toward goals  Frequency    Min 3X/week      PT Plan Current plan remains appropriate    Co-evaluation              AM-PAC PT "6 Clicks" Mobility   Outcome Measure  Help needed turning from your back to your side while in a flat bed without using bedrails?: None Help needed moving from lying on your back to sitting on the side of a flat bed without using bedrails?: None Help needed moving to and from a bed to a chair (including a wheelchair)?: None Help needed standing up from a chair using your arms (e.g., wheelchair or bedside chair)?: None Help needed to walk in hospital room?: A Little Help  needed climbing 3-5 steps with a railing? : A Little 6 Click Score: 22    End of Session Equipment Utilized During Treatment: Oxygen;Gait belt Activity Tolerance: Patient tolerated treatment well Patient left: in chair;with call bell/phone within reach;with chair alarm set   PT Visit Diagnosis: Unsteadiness on feet (R26.81)     Time: 1115-5208 PT Time Calculation (min) (ACUTE ONLY): 17 min  Charges:  $Gait Training: 8-22 mins                     Windell Norfolk, DPT, PN1   Supplemental Physical Therapist [place]    Pager [phone] Acute Rehab Office [phone]

## 2019-12-31 NOTE — Progress Notes (Signed)
PROGRESS NOTE  [name] [id number] DOB: [birth year] DOA: 12/28/2019 PCP: Cyndi Bender, PA-C   LOS: 3 days   Brief narrative: As per HPI,  [name] is a 64 y.o. female with medical history significant of COPD, on home 2 L at night, hyperlipidemia, hypertension, CHF. Patient presented with  2-week history of shortness of breath, dyspnea on exertion and orthopnea with leg swelling. Patient is already on Lasix for known history of CHF. Patient presented to the primary care provider and was noted to be hypoxic in the mid 18s on room air. Patient was saturating mid 60s on room air in the ED. In ED, tested negative for Covid.  elevated troponin in 50s-60. CTA negative for PE felt to have most likely CHF exacerbation. Patient was treated with Lasix 40 mg IV.  Assessment/Plan:  Active Problems:   Hypoxemia   COPD (chronic obstructive pulmonary disease) (HCC)   HTN (hypertension)   Acute respiratory failure with hypoxia (HCC)   Diabetes mellitus, type 2 (HCC)   Diastolic CHF, acute on chronic (HCC)   Chronic pain syndrome   Lumbar spondylosis   CHF exacerbation (HCC)   Elevated troponin  Acute on chronic diastolic congestive heart failure.  Continue telemetry monitor.  Troponins mildly elevated.  EKG unremarkable.   Strict intake and output charting.  Closely monitor.  2D echocardiogram with LVEF of 60%.  Follow renal function closely.  Continue aspirin, Lasix IV.  CT angiogram of the chest was negative for pulmonary embolism and no obvious consolidation.  Creatinine of 0.9 today from 1.0. improving on Lasix.  Elevated troponin.  Likely from CHF,demand ischemia from hypoxia. No chest pain.  EKG unremarkable.  Check 2D echocardiogram with preserved LV function without wall motion abnormality.  Review of previous records-patient did have myocardial perfusion scan on 07/2018 which showed normal LV function. Continue aspirin.  Acute on chronic hypoxic respiratory failure,history of COPD  on 2 L of oxygen at home.  Now with  5 L of oxygen saturating 92 pecent. ABG done showed hypercapnia.  To avoid sedative hypnotics.  Consider BIPAP at night and while resting.  Patient stated that she did not use BiPAP in the night.  Continue oxygen, nebulizers.  Added steroid inhaler yesterday.  Continue diuretic with Lasix IV BID. Spoke with pulmonary regarding consultation since patient does not have a pulmonary care physician as outpatient, still hypoxic requiring intermittent BiPAP despite the good diuresis.  Have not added IV steroid so far.  Chronic pain syndrome secondary to lumbar spondylosis.  Continue gabapentin, but with decreased doses due to somnolence  Diabetes mellitus type 2, diet controlled.  Check A1c.  Continue sliding scale insulin, diabetic diet.  Will closely monitor.  Last POC glucose was 88  Hypothyroidism.  Continue Synthroid  History of seizures.  Continue Keppra.  VTE Prophylaxis: Lovenox subq  Code Status: Full code  Family Communication: None today.  Disposition Plan: Home likely in 1-2 days, likely with home health, pending clinical improvement.  Will consult pulmonary today  Consultants: Pulmonary  Procedures:  BIPAP  Antibiotics:  Anti-infectives (From admission, onward)   None     Subjective:  Today, staff reported that patient is extremely dyspneic even on minimal exertion and even on eating.  Mild cough.  Patient denies any chest pain palpitation.  No fever.  Objective: Vitals:   12/31/19 0407 12/31/19 0739  BP: (!) 161/84 (!) 149/76  Pulse: 87 77  Resp: 13   Temp: 97.9 F (36.6 C) 99 F (  37.2 C)  SpO2: 92% 92%    Intake/Output Summary (Last 24 hours) at 12/31/2019 0833 Last data filed at 12/31/2019 0418 Gross per 24 hour  Intake 243 ml  Output 1650 ml  Net -1407 ml   Filed Weights   12/29/19 0602 12/30/19 0521 12/31/19 0407  Weight: 94.8 kg 94.6 kg 91.4 kg   Body mass index is 32.02 kg/m.   Physical Exam:  GENERAL:  Patient is more alert awake and communicative at the time of my evaluation..Not in obvious distress.  Morbidly obese.  On nasal cannula oxygen. HENT: No scleral pallor or icterus. Pupils equally reactive to light. Oral mucosa is moist NECK: is supple, no palpable thyroid enlargement. CHEST: Diminished breath sounds bilaterally.  Mild rhonchi anteriorly on the right side CVS: S1 and S2 heard, no murmur. Regular rate and rhythm. No pericardial rub. ABDOMEN: Soft, non-tender, bowel sounds are present. EXTREMITIES: lower extremity edema+ pitting edema.  Mild tremors noted CNS: Cranial nerves are intact. No focal motor or sensory deficits. SKIN: warm and dry without rashes.  Data Review: I have personally reviewed the following laboratory data and studies,  CBC: Recent Labs  Lab 12/28/19 1632 12/29/19 0357 12/30/19 0412  WBC 8.2 6.7 5.9  NEUTROABS 5.9  --   --   HGB 16.9* 16.2* 15.8*  HCT 53.6* 51.3* 51.3*  MCV 92.3 91.0 91.1  PLT 195 176 [id number]   Basic Metabolic Panel: Recent Labs  Lab 12/28/19 1632 12/29/19 0357 12/30/19 0412  NA 132* 137 141  K 4.5 3.6 3.6  CL 94* 95* 91*  CO2 25 35* 39*  GLUCOSE 104* 109* 79  BUN 9 8 9   CREATININE 1.02* 1.02* 0.97  CALCIUM 9.0 8.8* 8.9  MG  --  1.8 1.7  PHOS  --  4.2  --    Liver Function Tests: Recent Labs  Lab 12/28/19 1632 12/29/19 0357 12/30/19 0412  AST 27 22 20   ALT 16 14 12   ALKPHOS 23* 21* 19*  BILITOT 1.5* 1.2 1.3*  PROT 7.1 6.3* 5.9*  ALBUMIN 3.7 3.1* 2.9*   No results for input(s): LIPASE, AMYLASE in the last 168 hours. No results for input(s): AMMONIA in the last 168 hours. Cardiac Enzymes: No results for input(s): CKTOTAL, CKMB, CKMBINDEX, TROPONINI in the last 168 hours. BNP (last 3 results) Recent Labs    12/28/19 1633  BNP 421.1*    ProBNP (last 3 results) No results for input(s): PROBNP in the last 8760 hours.  CBG: Recent Labs  Lab 12/30/19 1211 12/30/19 1646 12/30/19 1713 12/30/19 2117  12/31/19 0736  GLUCAP 99 119* 110* 104* 88   Recent Results (from the past 240 hour(s))  SARS CORONAVIRUS 2 (TAT 6-24 HRS) Nasopharyngeal Nasopharyngeal Swab     Status: None   Collection Time: 12/28/19  9:02 PM   Specimen: Nasopharyngeal Swab  Result Value Ref Range Status   SARS Coronavirus 2 NEGATIVE NEGATIVE Final    Comment: (NOTE) SARS-CoV-2 target nucleic acids are NOT DETECTED. The SARS-CoV-2 RNA is generally detectable in upper and lower respiratory specimens during the acute phase of infection. Negative results do not preclude SARS-CoV-2 infection, do not rule out co-infections with other pathogens, and should not be used as the sole basis for treatment or other patient management decisions. Negative results must be combined with clinical observations, patient history, and epidemiological information. The expected result is Negative. Fact Sheet for Patients: SugarRoll.be Fact Sheet for Healthcare Providers: https://www.woods-mathews.com/ This test is not yet approved or cleared by  the [employer] and  has been authorized for detection and/or diagnosis of SARS-CoV-2 by FDA under an Emergency Use Authorization (EUA). This EUA will remain  in effect (meaning this test can be used) for the duration of the COVID-19 declaration under Section 56 4(b)(1) of the Act, 21 U.S.C. section 360bbb-3(b)(1), unless the authorization is terminated or revoked sooner. Performed at [facility], [employer] [address], [place], [place] [address]      Studies: ECHOCARDIOGRAM COMPLETE  Result Date: 12/29/2019   ECHOCARDIOGRAM REPORT   Patient Name:   ALEYAH SALOM Date of Exam: 12/29/2019 Medical Rec #:  [id number]      Height:       66.5 in Accession #:    [id number]     Weight:       209.0 lb Date of Birth:  12-27-1955       BSA:          2.05 m Patient Age:    13 years       BP:           170/81 mmHg Patient Gender: F              HR:            86 bpm. Exam Location:  Inpatient Procedure: 2D Echo, Color Doppler and Cardiac Doppler Indications:    Elevated Troponin  History:        Patient has prior history of Echocardiogram examinations, most                 recent 08/07/2018. CHF, COPD; Risk Factors:Hypertension, Diabetes                 and Dyslipidemia.  Sonographer:    Raquel Sarna Senior RDCS Referring Phys: [place]  1. Left ventricular ejection fraction, by visual estimation, is 60 to 65%. The left ventricle has normal function. There is mildly increased left ventricular hypertrophy.  2. The left ventricle has no regional wall motion abnormalities.  3. Global right ventricle has normal systolic function.The right ventricular size is normal. No increase in right ventricular wall thickness.  4. Left atrial size was mildly dilated.  5. Right atrial size was normal.  6. Mild mitral annular calcification.  7. The mitral valve is normal in structure. No evidence of mitral valve regurgitation. No evidence of mitral stenosis.  8. The tricuspid valve is normal in structure.  9. The aortic valve is tricuspid. Aortic valve regurgitation is not visualized. Mild to moderate aortic valve sclerosis/calcification without any evidence of aortic stenosis. 10. The pulmonic valve was grossly normal. Pulmonic valve regurgitation is trivial. 11. Mildly elevated pulmonary artery systolic pressure. 12. The inferior vena cava is normal in size with greater than 50% respiratory variability, suggesting right atrial pressure of 3 mmHg. FINDINGS  Left Ventricle: Left ventricular ejection fraction, by visual estimation, is 60 to 65%. The left ventricle has normal function. The left ventricle has no regional wall motion abnormalities. There is mildly increased left ventricular hypertrophy. Normal left atrial pressure. Right Ventricle: The right ventricular size is normal. No increase in right ventricular wall thickness. Global RV systolic function is has normal  systolic function. The tricuspid regurgitant velocity is 2.46 m/s, and with an assumed right atrial pressure  of 15 mmHg, the estimated right ventricular systolic pressure is mildly elevated at 39.2 mmHg. Left Atrium: Left atrial size was mildly dilated. Right Atrium: Right atrial size was normal in size Pericardium: There  is no evidence of pericardial effusion. Mitral Valve: The mitral valve is normal in structure. There is mild thickening of the mitral valve leaflet(s). There is mild calcification of the mitral valve leaflet(s). Mild mitral annular calcification. No evidence of mitral valve regurgitation. No evidence of mitral valve stenosis by observation. Tricuspid Valve: The tricuspid valve is normal in structure. Tricuspid valve regurgitation is trivial. Aortic Valve: The aortic valve is tricuspid. Aortic valve regurgitation is not visualized. Mild to moderate aortic valve sclerosis/calcification is present, without any evidence of aortic stenosis. Aortic valve mean gradient measures 7.0 mmHg. Aortic valve peak gradient measures 13.5 mmHg. Aortic valve area, by VTI measures 2.51 cm. Pulmonic Valve: The pulmonic valve was grossly normal. Pulmonic valve regurgitation is trivial. Pulmonic regurgitation is trivial. Aorta: The aortic root, ascending aorta and aortic arch are all structurally normal, with no evidence of dilitation or obstruction. Venous: The inferior vena cava is normal in size with greater than 50% respiratory variability, suggesting right atrial pressure of 3 mmHg. IAS/Shunts: No atrial level shunt detected by color flow Doppler. There is no evidence of a patent foramen ovale. No ventricular septal defect is seen or detected. There is no evidence of an atrial septal defect.  LEFT VENTRICLE PLAX 2D LVIDd:         4.20 cm  Diastology LVIDs:         2.70 cm  LV e' lateral:   9.14 cm/s LV PW:         1.20 cm  LV E/e' lateral: 8.7 LV IVS:        1.30 cm  LV e' medial:    5.55 cm/s LVOT diam:     1.90  cm  LV E/e' medial:  14.4 LV SV:         52 ml LV SV Index:   24.05 LVOT Area:     2.84 cm  RIGHT VENTRICLE RV S prime:     13.40 cm/s TAPSE (M-mode): 2.9 cm LEFT ATRIUM             Index       RIGHT ATRIUM           Index LA diam:        4.10 cm 2.00 cm/m  RA Area:     18.50 cm LA Vol (A2C):   53.9 ml 26.31 ml/m RA Volume:   55.90 ml  27.28 ml/m LA Vol (A4C):   34.2 ml 16.69 ml/m LA Biplane Vol: 44.2 ml 21.57 ml/m  AORTIC VALVE AV Area (Vmax):    2.74 cm AV Area (Vmean):   2.67 cm AV Area (VTI):     2.51 cm AV Vmax:           184.00 cm/s AV Vmean:          123.000 cm/s AV VTI:            0.358 m AV Peak Grad:      13.5 mmHg AV Mean Grad:      7.0 mmHg LVOT Vmax:         178.00 cm/s LVOT Vmean:        116.000 cm/s LVOT VTI:          0.317 m LVOT/AV VTI ratio: 0.89  AORTA Ao Root diam: 2.40 cm Ao Asc diam:  3.10 cm MITRAL VALVE                        TRICUSPID VALVE MV  Area (PHT): 2.91 cm             TR Peak grad:   24.2 mmHg MV PHT:        75.69 msec           TR Vmax:        246.00 cm/s MV Decel Time: 261 msec MV E velocity: 79.70 cm/s 103 cm/s  SHUNTS MV A velocity: 71.60 cm/s 70.3 cm/s Systemic VTI:  0.32 m MV E/A ratio:  1.11       1.5       Systemic Diam: 1.90 cm  Jenkins Rouge MD Electronically signed by Jenkins Rouge MD Signature Date/Time: 12/29/2019/9:58:14 AM    Final     Scheduled Meds: . ARIPiprazole  10 mg Oral Daily  . aspirin  81 mg Oral Daily  . budesonide (PULMICORT) nebulizer solution  0.25 mg Nebulization BID  . busPIRone  30 mg Oral BID  . enoxaparin (LOVENOX) injection  40 mg Subcutaneous Daily  . escitalopram  20 mg Oral q morning - 10a  . famotidine  10 mg Oral Daily  . fenofibrate  54 mg Oral Daily  . furosemide  40 mg Intravenous Q12H  . gabapentin  800 mg Oral TID  . insulin aspart  0-5 Units Subcutaneous QHS  . insulin aspart  0-9 Units Subcutaneous TID WC  . levETIRAcetam  500 mg Oral BID  . levothyroxine  25 mcg Oral QAC breakfast  . losartan  100 mg Oral Daily    . nicotine  21 mg Transdermal Daily  . nortriptyline  75 mg Oral QHS  . pantoprazole  40 mg Oral q morning - 10a  . sodium chloride flush  3 mL Intravenous Q12H    Continuous Infusions: . sodium chloride       Flora Lipps, MD  [facility] 12/31/2019

## 2019-12-31 NOTE — Plan of Care (Signed)
  Problem: Clinical Measurements: Goal: Ability to maintain clinical measurements within normal limits will improve Outcome: Progressing   Problem: Clinical Measurements: Goal: Respiratory complications will improve Outcome: Progressing   Problem: Clinical Measurements: Goal: Cardiovascular complication will be avoided Outcome: Progressing   

## 2020-01-01 ENCOUNTER — Inpatient Hospital Stay (HOSPITAL_COMMUNITY): Payer: Medicare Other

## 2020-01-01 LAB — GLUCOSE, CAPILLARY
Glucose-Capillary: 107 mg/dL — ABNORMAL HIGH (ref 70–99)
Glucose-Capillary: 118 mg/dL — ABNORMAL HIGH (ref 70–99)
Glucose-Capillary: 93 mg/dL (ref 70–99)

## 2020-01-01 LAB — COMPREHENSIVE METABOLIC PANEL
ALT: 12 U/L (ref 0–44)
AST: 20 U/L (ref 15–41)
Albumin: 3.2 g/dL — ABNORMAL LOW (ref 3.5–5.0)
Alkaline Phosphatase: 19 U/L — ABNORMAL LOW (ref 38–126)
Anion gap: 9 (ref 5–15)
BUN: 26 mg/dL — ABNORMAL HIGH (ref 8–23)
CO2: 45 mmol/L — ABNORMAL HIGH (ref 22–32)
Calcium: 9.2 mg/dL (ref 8.9–10.3)
Chloride: 87 mmol/L — ABNORMAL LOW (ref 98–111)
Creatinine, Ser: 1.32 mg/dL — ABNORMAL HIGH (ref 0.44–1.00)
GFR calc Af Amer: 50 mL/min — ABNORMAL LOW (ref >60)
GFR calc non Af Amer: 43 mL/min — ABNORMAL LOW (ref >60)
Glucose, Bld: 97 mg/dL (ref 70–99)
Potassium: 4 mmol/L (ref 3.5–5.1)
Sodium: 141 mmol/L (ref 135–145)
Total Bilirubin: 1 mg/dL (ref 0.3–1.2)
Total Protein: 6.5 g/dL (ref 6.5–8.1)

## 2020-01-01 LAB — BLOOD GAS, ARTERIAL
Acid-Base Excess: 19.5 mmol/L — ABNORMAL HIGH (ref 0.0–2.0)
Bicarbonate: 45.7 mmol/L — ABNORMAL HIGH (ref 20.0–28.0)
Drawn by: 21179
FIO2: 44
O2 Saturation: 91.7 %
Patient temperature: 36.7
pCO2 arterial: 76.8 mmHg (ref 32.0–48.0)
pH, Arterial: 7.391 (ref 7.350–7.450)
pO2, Arterial: 67.5 mmHg — ABNORMAL LOW (ref 83.0–108.0)

## 2020-01-01 LAB — CBC
HCT: 55.8 % — ABNORMAL HIGH (ref 36.0–46.0)
Hemoglobin: 17 g/dL — ABNORMAL HIGH (ref 12.0–15.0)
MCH: 28.2 pg (ref 26.0–34.0)
MCHC: 30.5 g/dL (ref 30.0–36.0)
MCV: 92.5 fL (ref 80.0–100.0)
Platelets: 190 10*3/uL (ref 150–400)
RBC: 6.03 MIL/uL — ABNORMAL HIGH (ref 3.87–5.11)
RDW: 18.6 % — ABNORMAL HIGH (ref 11.5–15.5)
WBC: 6.1 10*3/uL (ref 4.0–10.5)
nRBC: 0 % (ref 0.0–0.2)

## 2020-01-01 LAB — PROCALCITONIN: Procalcitonin: <0.1 ng/mL

## 2020-01-01 LAB — MAGNESIUM: Magnesium: 1.9 mg/dL (ref 1.7–2.4)

## 2020-01-01 LAB — BRAIN NATRIURETIC PEPTIDE: B Natriuretic Peptide: 96.5 pg/mL (ref 0.0–100.0)

## 2020-01-01 MED ORDER — NORTRIPTYLINE HCL 25 MG PO CAPS
50.0000 mg | ORAL_CAPSULE | Freq: Every day | ORAL | Status: DC
  Administered 2020-01-01 – 2020-01-02 (×2): 50 mg via ORAL
  Filled 2020-01-01 (×2): qty 2

## 2020-01-01 MED ORDER — GABAPENTIN 400 MG PO CAPS
400.0000 mg | ORAL_CAPSULE | Freq: Three times a day (TID) | ORAL | Status: DC
  Administered 2020-01-01 – 2020-01-03 (×7): 400 mg via ORAL
  Filled 2020-01-01 (×7): qty 1

## 2020-01-01 MED ORDER — FUROSEMIDE 10 MG/ML IJ SOLN
40.0000 mg | Freq: Every day | INTRAMUSCULAR | Status: DC
  Administered 2020-01-02 – 2020-01-03 (×2): 40 mg via INTRAVENOUS
  Filled 2020-01-01 (×2): qty 4

## 2020-01-01 NOTE — Progress Notes (Signed)
Pt taken off bipap  at this time and toerating well. Placed back on 6 lpm [place].   RN informed

## 2020-01-01 NOTE — Care Management Important Message (Signed)
Important Message  Patient Details  Name: SAMA CIALLELLA MRN: [id number] Date of Birth: [birth year]   Medicare Important Message Given:  Yes     Shelda Altes 01/01/2020, 12:59 PM

## 2020-01-01 NOTE — Progress Notes (Signed)
Pt taken off Bipap and placed on 6 LPM [place] at this time and tolerating well

## 2020-01-01 NOTE — Progress Notes (Signed)
Bipap placed on pt. Tolerating well.

## 2020-01-01 NOTE — Progress Notes (Signed)
PROGRESS NOTE  [name] [id number] DOB: May 12, 1956 DOA: 12/28/2019 PCP: Cyndi Bender, PA-C   LOS: 4 days   Brief narrative: As per HPI,  [name] is a 64 y.o. female with medical history significant of COPD, on home 2 L at night, hyperlipidemia, hypertension, CHF. Patient presented with  2-week history of shortness of breath, dyspnea on exertion and orthopnea with leg swelling. Patient is already on Lasix for known history of CHF. Patient presented to the primary care provider and was noted to be hypoxic in the mid 56s on room air. Patient was saturating mid 60s on room air in the ED. In ED, tested negative for Covid.  elevated troponin in 50s-60. CTA negative for PE felt to have most likely CHF exacerbation. Patient was treated with Lasix 40 mg IV.  Patient was then admitted to the hospital.  Assessment/Plan:  Active Problems:   Hypoxemia   COPD (chronic obstructive pulmonary disease) (HCC)   HTN (hypertension)   Acute respiratory failure with hypoxia (HCC)   Diabetes mellitus, type 2 (HCC)   Diastolic CHF, acute on chronic (HCC)   Chronic pain syndrome   Lumbar spondylosis   CHF exacerbation (HCC)   Elevated troponin   Acute on chronic hypoxic respiratory failure, history of COPD on 2 L of oxygen at home.  Now with  6 L of oxygen saturating 97 pecent. ABG done 1/16 showed hypercapnia with PCo2 of 71 and PH of 7.3.  Because of increased work of breathing patient was put on BiPAP.  Sedatives were decreased.  Patient was continued on oxygen, nebulizers, steroid inhalers.  Receiving IV diuretic with Lasix IV BID and has been diuresing significantly.  Creatinine today of 1.3.  Will decrease Lasix dosing to once daily  Pulmonary on board due to significant hypoxia, dyspnea on minimal exertion .  Spoke with pulmonary team since the patient might need BiPAP on discharge.  ABG done today showed pH of 7.3 with PCO2 of 76.  Procalcitonin of 0.10.  Acute on chronic diastolic  congestive heart failure. Improving. Continue telemetry monitor.  Troponins mildly elevated.  EKG unremarkable.   Strict intake and output charting.   2D echocardiogram with LVEF of 60%. Rising creatine levels, will hold losartan and decrease lasix dose.  Continue aspirin, Lasix IV.  CT angiogram of the chest was negative for pulmonary embolism and no obvious consolidation.  Creatinine of 1.3 today from 0.9.  Elevated troponin.  Likely from CHF,demand ischemia from hypoxia. No chest pain.  EKG unremarkable.  Check 2D echocardiogram with preserved LV function without wall motion abnormality.  Review of previous records-patient did have myocardial perfusion scan on 07/2018 which showed normal LV function. Continue aspirin..   Chronic pain syndrome secondary to lumbar spondylosis.  Continue gabapentin, but with decreased dose due to somnolence. Decreased nortriptyline dose as well.  Diabetes mellitus type 2, diet controlled.  Hemoglobin A1c of 5.7 on 12/28/19.  Continue sliding scale insulin, diabetic diet.  Will closely monitor.  Last POC glucose was 107  Hypothyroidism.  Continue Synthroid  History of seizures.  Continue Keppra.  VTE Prophylaxis: Lovenox subq  Code Status: Full code  Family Communication:  I spoke with the patient's sister [name] on the phone and updated her about the clinical condition of the patient.  Disposition Plan:  She has been seen by physical therapy/occupational therapy and recommend home health PT and OT on discharge.  DME includes 3 and 1 bedside commode.  We will put DME and  home health orders today.  Patient will likely need BiPAP on discharge.  I have spoken with pulmonary regarding this.   Consultants: Pulmonary  Procedures:  BIPAP  Antibiotics:  Anti-infectives (From admission, onward)   None     Subjective:  Today, feels a little better with breathing.  Diuresing well.  Still dyspneic and hypoxic.  Was on BiPAP overnight and weaned off in the  morning.  Objective: Vitals:   01/01/20 0312 01/01/20 0504  BP:  131/79  Pulse:  67  Resp:  14  Temp:  98 F (36.7 C)  SpO2: 100% 97%    Intake/Output Summary (Last 24 hours) at 01/01/2020 0816 Last data filed at 01/01/2020 0500 Gross per 24 hour  Intake 784 ml  Output 2750 ml  Net -1966 ml   Filed Weights   12/30/19 0521 12/31/19 0407 01/01/20 0504  Weight: 94.6 kg 91.4 kg 90.4 kg   Body mass index is 31.7 kg/m.   Physical Exam:  GENERAL: Patient is more alert awake and communicative at the time of my evaluation..Not in obvious distress.  Morbidly obese.  On nasal cannula oxygen. HENT: No scleral pallor or icterus. Pupils equally reactive to light. Oral mucosa is moist NECK: is supple, no palpable thyroid enlargement. CHEST: Diminished breath sounds bilaterally.   CVS: S1 and S2 heard, no murmur. Regular rate and rhythm. No pericardial rub. ABDOMEN: Soft, non-tender, bowel sounds are present. EXTREMITIES: lower extremity edema+ pitting edema.  Mild tremors noted CNS: Cranial nerves are intact. No focal motor or sensory deficits. SKIN: warm and dry without rashes.  Data Review: I have personally reviewed the following laboratory data and studies,  CBC: Recent Labs  Lab 12/28/19 1632 12/29/19 0357 12/30/19 0412 01/01/20 0357  WBC 8.2 6.7 5.9 6.1  NEUTROABS 5.9  --   --   --   HGB 16.9* 16.2* 15.8* 17.0*  HCT 53.6* 51.3* 51.3* 55.8*  MCV 92.3 91.0 91.1 92.5  PLT 195 176 155 [id number]   Basic Metabolic Panel: Recent Labs  Lab 12/28/19 1632 12/29/19 0357 12/30/19 0412 01/01/20 0357  NA 132* 137 141 141  K 4.5 3.6 3.6 4.0  CL 94* 95* 91* 87*  CO2 25 35* 39* 45*  GLUCOSE 104* 109* 79 97  BUN 9 8 9  26*  CREATININE 1.02* 1.02* 0.97 1.32*  CALCIUM 9.0 8.8* 8.9 9.2  MG  --  1.8 1.7 1.9  PHOS  --  4.2  --   --    Liver Function Tests: Recent Labs  Lab 12/28/19 1632 12/29/19 0357 12/30/19 0412 01/01/20 0357  AST 27 22 20 20   ALT 16 14 12 12   ALKPHOS 23*  21* 19* 19*  BILITOT 1.5* 1.2 1.3* 1.0  PROT 7.1 6.3* 5.9* 6.5  ALBUMIN 3.7 3.1* 2.9* 3.2*   No results for input(s): LIPASE, AMYLASE in the last 168 hours. No results for input(s): AMMONIA in the last 168 hours. Cardiac Enzymes: No results for input(s): CKTOTAL, CKMB, CKMBINDEX, TROPONINI in the last 168 hours. BNP (last 3 results) Recent Labs    12/28/19 1633 12/31/19 1459 01/01/20 0357  BNP 421.1* 175.7* 96.5    ProBNP (last 3 results) No results for input(s): PROBNP in the last 8760 hours.  CBG: Recent Labs  Lab 12/31/19 0736 12/31/19 1137 12/31/19 1642 12/31/19 2105 01/01/20 0755  GLUCAP 88 113* 120* 152* 107*   Recent Results (from the past 240 hour(s))  SARS CORONAVIRUS 2 (TAT 6-24 HRS) Nasopharyngeal Nasopharyngeal Swab  Status: None   Collection Time: 12/28/19  9:02 PM   Specimen: Nasopharyngeal Swab  Result Value Ref Range Status   SARS Coronavirus 2 NEGATIVE NEGATIVE Final    Comment: (NOTE) SARS-CoV-2 target nucleic acids are NOT DETECTED. The SARS-CoV-2 RNA is generally detectable in upper and lower respiratory specimens during the acute phase of infection. Negative results do not preclude SARS-CoV-2 infection, do not rule out co-infections with other pathogens, and should not be used as the sole basis for treatment or other patient management decisions. Negative results must be combined with clinical observations, patient history, and epidemiological information. The expected result is Negative. Fact Sheet for Patients: SugarRoll.be Fact Sheet for Healthcare Providers: https://www.woods-mathews.com/ This test is not yet approved or cleared by the [place] FDA and  has been authorized for detection and/or diagnosis of SARS-CoV-2 by FDA under an Emergency Use Authorization (EUA). This EUA will remain  in effect (meaning this test can be used) for the duration of the COVID-19 declaration under Section 56  4(b)(1) of the Act, 21 U.S.C. section 360bbb-3(b)(1), unless the authorization is terminated or revoked sooner. Performed at [facility], [place] [address], [place], [place] [address]      Studies: DG CHEST PORT 1 VIEW  Result Date: 01/01/2020 CLINICAL DATA:  Acute on chronic respiratory failure. EXAM: PORTABLE CHEST 1 VIEW COMPARISON:  One-view chest x-ray 12/28/2019 FINDINGS: Heart is mildly enlarged. Lung volumes are low. Pulmonary vascular congestion is present. No focal airspace disease is evident. IMPRESSION: Low lung volumes and mild pulmonary vascular congestion. Electronically Signed   By: San Morelle M.D.   On: 01/01/2020 06:41    Scheduled Meds: . ARIPiprazole  10 mg Oral Daily  . aspirin  81 mg Oral Daily  . budesonide (PULMICORT) nebulizer solution  0.25 mg Nebulization BID  . busPIRone  30 mg Oral BID  . enoxaparin (LOVENOX) injection  40 mg Subcutaneous Daily  . escitalopram  20 mg Oral q morning - 10a  . famotidine  10 mg Oral Daily  . fenofibrate  54 mg Oral Daily  . furosemide  40 mg Intravenous Q12H  . gabapentin  800 mg Oral TID  . insulin aspart  0-5 Units Subcutaneous QHS  . insulin aspart  0-9 Units Subcutaneous TID WC  . levETIRAcetam  500 mg Oral BID  . levothyroxine  25 mcg Oral QAC breakfast  . losartan  100 mg Oral Daily  . nicotine  21 mg Transdermal Daily  . nortriptyline  75 mg Oral QHS  . pantoprazole  40 mg Oral q morning - 10a  . sodium chloride flush  3 mL Intravenous Q12H    Continuous Infusions: . sodium chloride       Flora Lipps, MD  [facility] 01/01/2020

## 2020-01-01 NOTE — Progress Notes (Signed)
BiPAP placed on pt by Caryl Pina, RT d/t ABG results. Pt resting in bed with no distress noted. Will continue to monitor.

## 2020-01-01 NOTE — Progress Notes (Signed)
Patient took off BiPAP, educated patient on the importance of it. Patient stated wanting a break and will put it back on later. Will continue to montior

## 2020-01-01 NOTE — Progress Notes (Signed)
NAME:  [name], MRN:  [id number], DOB:  [birth year], LOS: 4 ADMISSION DATE:  12/28/2019, CONSULTATION DATE:  12/31/2019 REFERRING MD:  Pokhrel CHIEF COMPLAINT:  Hypoxia    Brief History   64yo F smoker /w COPD chronic 2lpm O2 dependent, CHF admitted and treated for acute on chronic hypoxic respiratory failure d/t a/c CHF decompensation with increased FiO2 requirements. PCCM consulted for recommendations as patient does not have an out patient pulmonologist.   History of present illness   [name] is a 64yo F PMH 2ppd smoker since age 69, COPD, chronic hypoxic respiratory failure O2 dependent at 2 L/min, diastolic congestive heart failure EF 60-65%, hyperlipidemia, hypertension, chronic pain syndrome, diabetes mellitus, hypothyroidism, history of seizures admitted 12/28/2019 after she presented with a 2-week history of shortness of breath, dyspnea on exertion, orthopnea and lower extremity swelling.  She presented to her primary care provider on that day and was noted to be hypoxic with SPO2 in the 50s on room air though there was no associated shortness of breath.  In the ED she had a minimally elevated troponin, chest x-ray consistent with pulmonary edema, no evidence of ischemia on EKG. She was admitted to hospitalist services and treated for acute on chronic hypoxic respiratory failure thought to be secondary to acute decompensation of diastolic congestive heart failure and found to be hypercarbic requiring BiPAP which was subsequently weaned off.   She has been aggressively diuresed, down 3.4 kg since admission, -8.4 L, echo shows preserved EF of no significant findings.  Bronchodilators, inhaled steroids added.  Despite these treatments she continued to require up to 5 L of oxygen saturating around 92% and desaturating with ambulation.  PCCM consulted for further recommendations, patient does not have an outpatient pulmonologist.    Her primary care provider provides her oxygen. She has been  smoking since she was 19 yoa. 2 dogs live in her home. Her niece lives with her and also smokes. She does not wear O2 at home as she is supposed to.   Past Medical History   Past Medical History:  Diagnosis Date  . Anxiety   . Arthritis   . COPD (chronic obstructive pulmonary disease) (HCC)    no inhalers--smoker, no oxygen  . Depression   . Diabetes mellitus    no meds-diet controlled  . GERD (gastroesophageal reflux disease)   . Hyperlipidemia   . Hypertension   . Hypothyroid   . Restless leg syndrome   . Seizures ([place])    02/13/2011 -hx of seizure due to "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  no seizure since--pt does take keppra and office note from neurologist dr. Leta Baptist on this chart     [facility] Events   1/15 Admitted   Consults:  PCCM   Procedures:  NA  Significant Diagnostic Tests:  1/16 Echo EF 60 to 65%, no regional wall motion abnormalities, mild increased LV hypertrophy. Mildly elevated RV systolic pressure at 123XX123 mmHg. LA mildly dilated. RA normal. Mitral valve mild annular calcification. TV regurgitation trivial. Mild to moderate aortic valve sclerosis/calcification without stenosis. PV trivial regurgitation. IAS/Shunts: No atrial level shunt detected by color flow Doppler. There is no evidence of a patent foramen ovale. No ventricular septal defect is seen or detected. There is no evidence of an atrial septal defect.  1/15 chest x-ray personally reviewed shows cardiomegaly and possible small infiltrate  1/15 CTA  chest  No evidence of pulmonary embolus. Mild cardiomegaly.  Coronary artery disease. Areas of atelectasis and/or scarring in the right upper lobe, right middle lobe and lingula.  Micro Data:  SARS Coronavirus 2 negative   Antimicrobials:  NA   Interim history/subjective:  She wore BiPAP until around 3:00 this  morning, when she was switched to nasal cannula oxygen.  No complaints this morning.   Objective   Blood pressure 127/78, pulse 83, temperature 98.1 F (36.7 C), temperature source Oral, resp. rate 18, height 5' 6.5" (1.689 m), weight 90.4 kg, SpO2 95 %.        Intake/Output Summary (Last 24 hours) at 01/01/2020 1616 Last data filed at 01/01/2020 1300 Gross per 24 hour  Intake 862 ml  Output 2200 ml  Net -1338 ml   Filed Weights   12/30/19 0521 12/31/19 0407 01/01/20 0504  Weight: 94.6 kg 91.4 kg 90.4 kg    Examination: General: Chronically ill-appearing woman, lying in bed sleeping, arousable to physical stimulation. HENT: [place]/AT, eyes anicteric Neck: No JVD CV: Regular rate and rhythm, no murmurs Lungs: No wheezing, reduced breath sounds throughout.  No rales.  Breathing comfortably on nasal cannula, saturating 96 to 97% on 5 L ABD: Abdomen soft, nontender EXT: minimal peripheral edema, no clubbing or cyanosis Skin: Chronic venous stasis changes bilateral lower extremities, no ecchymoses or rashes. Neuro: Alert, answering questions appropriately.  Moving all extremities spontaneously.  Able to sit forward in bed with minimal assistance.   CXR personally reviewed-improved pulmonary edema bilaterally since admission CXR, mild basilar atelectasis.  Resolved Hospital Problem list   NA   Assessment & Plan:  This is a 64 yo female with longstanding tobacco dependence with acute on chronic hypoxic & hypercapneic respiratory failure 2/2 acute decompensation of diastolic CHF with acute pulmonary edema, who seems to have been  Diuresed to euvolemia with persistenyly increased O2 requirements likely d/t poorly- controlled COPD. No PE on admission CTA. Her PTA oxygen requirements were not truly known because it does not seem that she had been followed as an outpatient to determine if she needed oxygen titration.   -Titrate down oxygen as able to maintain SPO2 greater than 88%.  Will need to  be walked prior to discharge to determine ambulatory oxygen requirements.  My suspicion is that she is close to her baseline currently.  We have encouraged compliance with home oxygen at all times, especially when walking and with sleep. -Okay to prescribe nocturnal ventilation 14/8, supplemental bleed-in O2 at the same rate as her ambulatory oxygen requirement at discharge (diagnosis of chronic hypercapnic respiratory failure).  Ideally this could be done as an outpatient to encourage/ maximize compliance and help with titration.  She will need to follow-up with about her pulmonary and sleep medicine for ongoing titration of maintenance. -Recommend out of bed mobility, continued incentive spirometry. -Strongly recommend smoking cessation.  Unfortunately she has had side effects with Chantix in the past.  Recommend continuing nicotine replacement therapy while admitted and after discharge. -Recommend outpatient triple inhaled therapy-LABA, LAMA, ICS plus albuterol as needed.    Labs   CBC: Recent Labs  Lab 12/28/19 1632 12/29/19 0357 12/30/19 0412 01/01/20 0357  WBC 8.2 6.7 5.9 6.1  NEUTROABS 5.9  --   --   --   HGB 16.9* 16.2* 15.8* 17.0*  HCT 53.6* 51.3* 51.3* 55.8*  MCV 92.3 91.0 91.1 92.5  PLT 195 176 155 [id number]    Basic Metabolic Panel: Recent Labs  Lab 12/28/19 1632  12/29/19 0357 12/30/19 0412 01/01/20 0357  NA 132* 137 141 141  K 4.5 3.6 3.6 4.0  CL 94* 95* 91* 87*  CO2 25 35* 39* 45*  GLUCOSE 104* 109* 79 97  BUN 9 8 9  26*  CREATININE 1.02* 1.02* 0.97 1.32*  CALCIUM 9.0 8.8* 8.9 9.2  MG  --  1.8 1.7 1.9  PHOS  --  4.2  --   --    GFR: Estimated Creatinine Clearance: 50.6 mL/min (A) (by C-G formula based on SCr of 1.32 mg/dL (H)). Recent Labs  Lab 12/28/19 1632 12/29/19 0357 12/30/19 0412 01/01/20 0357  PROCALCITON  --   --   --  <0.10  WBC 8.2 6.7 5.9 6.1    Liver Function Tests: Recent Labs  Lab 12/28/19 1632 12/29/19 0357 12/30/19 0412 01/01/20 0357   AST 27 22 20 20   ALT 16 14 12 12   ALKPHOS 23* 21* 19* 19*  BILITOT 1.5* 1.2 1.3* 1.0  PROT 7.1 6.3* 5.9* 6.5  ALBUMIN 3.7 3.1* 2.9* 3.2*   No results for input(s): LIPASE, AMYLASE in the last 168 hours. No results for input(s): AMMONIA in the last 168 hours.  ABG    Component Value Date/Time   PHART 7.391 01/01/2020 0826   PCO2ART 76.8 (HH) 01/01/2020 0826   PO2ART 67.5 (L) 01/01/2020 0826   HCO3 45.7 (H) 01/01/2020 0826   TCO2 25.1 04/15/2014 0317   ACIDBASEDEF 1.8 04/15/2014 0317   O2SAT 91.7 01/01/2020 0826     Coagulation Profile: Recent Labs  Lab 12/28/19 1632  INR 1.2    Cardiac Enzymes: No results for input(s): CKTOTAL, CKMB, CKMBINDEX, TROPONINI in the last 168 hours.  HbA1C: Hgb A1c MFr Bld  Date/Time Value Ref Range Status  12/29/2019 03:55 AM 5.7 (H) 4.8 - 5.6 % Final    Comment:    (NOTE) Pre diabetes:          5.7%-6.4% Diabetes:              >6.4% Glycemic control for   <7.0% adults with diabetes   01/11/2013 06:10 AM 5.5 <5.7 % Final    Comment:    (NOTE)                                                                       According to the ADA Clinical Practice Recommendations for 2011, when HbA1c is used as a screening test:  >=6.5%   Diagnostic of Diabetes Mellitus           (if abnormal result is confirmed) 5.7-6.4%   Increased risk of developing Diabetes Mellitus References:Diagnosis and Classification of Diabetes Mellitus,Diabetes [id number] 1):S62-S69 and Standards of Medical Care in         Diabetes - 2011,Diabetes [id number] (Suppl 1):S11-S61.    CBG: Recent Labs  Lab 12/31/19 1137 12/31/19 1642 12/31/19 2105 01/01/20 0755 01/01/20 1204  GLUCAP 113* 120* 152* 107* 118*     Rachel Hy, DO 01/01/20 4:50 PM [facility]

## 2020-01-01 NOTE — Progress Notes (Signed)
CRITICAL VALUE ALERT  Critical Value:  CO2 76.8  Date & Time Notied:  01/01/20 0913  Provider Notified: Louanne Belton, MD  Orders Received/Actions taken: No orders received at this time.

## 2020-01-02 ENCOUNTER — Encounter (HOSPITAL_COMMUNITY): Payer: Self-pay | Admitting: Internal Medicine

## 2020-01-02 ENCOUNTER — Telehealth: Payer: Self-pay | Admitting: Pulmonary Disease

## 2020-01-02 DIAGNOSIS — E1140 Type 2 diabetes mellitus with diabetic neuropathy, unspecified: Secondary | ICD-10-CM

## 2020-01-02 DIAGNOSIS — J441 Chronic obstructive pulmonary disease with (acute) exacerbation: Principal | ICD-10-CM

## 2020-01-02 DIAGNOSIS — F329 Major depressive disorder, single episode, unspecified: Secondary | ICD-10-CM

## 2020-01-02 DIAGNOSIS — F419 Anxiety disorder, unspecified: Secondary | ICD-10-CM

## 2020-01-02 DIAGNOSIS — R5381 Other malaise: Secondary | ICD-10-CM

## 2020-01-02 LAB — GLUCOSE, CAPILLARY
Glucose-Capillary: 112 mg/dL — ABNORMAL HIGH (ref 70–99)
Glucose-Capillary: 86 mg/dL (ref 70–99)
Glucose-Capillary: 105 mg/dL — ABNORMAL HIGH (ref 70–99)
Glucose-Capillary: 150 mg/dL — ABNORMAL HIGH (ref 70–99)
Glucose-Capillary: 127 mg/dL — ABNORMAL HIGH (ref 70–99)

## 2020-01-02 MED ORDER — QVAR REDIHALER 80 MCG/ACT IN AERB: 2.0000 | INHALATION_SPRAY | Freq: Two times a day (BID) | RESPIRATORY_TRACT | 1 refills | Status: DC

## 2020-01-02 MED ORDER — UMECLIDINIUM-VILANTEROL 62.5-25 MCG/INH IN AEPB
1.0000 | INHALATION_SPRAY | Freq: Every day | RESPIRATORY_TRACT | Status: DC
  Administered 2020-01-03: 10:00:00 1 via RESPIRATORY_TRACT
  Filled 2020-01-02: qty 14

## 2020-01-02 MED ORDER — ANORO ELLIPTA 62.5-25 MCG/INH IN AEPB: 1.0000 | INHALATION_SPRAY | Freq: Every day | RESPIRATORY_TRACT | 1 refills | Status: DC

## 2020-01-02 MED ORDER — FLUTICASONE PROPIONATE HFA 110 MCG/ACT IN AERO: 2.0000 | INHALATION_SPRAY | Freq: Two times a day (BID) | RESPIRATORY_TRACT | 0 refills | Status: DC

## 2020-01-02 MED ORDER — NICOTINE 21 MG/24HR TD PT24: 21.0000 mg | MEDICATED_PATCH | TRANSDERMAL | 1 refills | Status: AC

## 2020-01-02 MED FILL — FLOVENT HFA 110 MCG INHALER: 110 | 30 days supply | Qty: 12 | Fill #0

## 2020-01-02 MED FILL — ANORO ELLIPTA 62.5-25 MCG I: 62.5-25 | 30 days supply | Qty: 60 | Fill #0

## 2020-01-02 NOTE — Telephone Encounter (Signed)
Would this need to be a consult?

## 2020-01-02 NOTE — TOC Initial Note (Signed)
Transition of Care [facility]) - Initial/Assessment Note    Patient Details  Name: [name] MRN: [id number] Date of Birth: 1956-11-09  Transition [facility]) CM/SW Contact:    Bethena Roys, RN Phone Number: 01/02/2020, 4:33 PM  Clinical Narrative:  Patient presented for hypoxemia. Prior to arrival patient was from home with family support. Patient has primary care provider in [place] at [facility]. Patient has Durable Medical Equipment 3n1 in the room. Patient unable to get the BIPAP due to no sleep study- please see previous note. Case Manager offered choice for home health and patient chose [facility]- referral sent to liaison and start of care to begin within 24-48 post transition home. Unable to get patient home tonight due to not knowing agency name for 02. Finally figured out patient uses [facility] in [place]- office is closed e-mail sent that patient will need increased liter flow and tanks sent to [facility]. Tanks will be delivered to the hospital in am. Alyse Low will be able to provide transport home. No further needs from Case Manager at this time.                 Expected Discharge Plan: [place] Barriers to Discharge: No Barriers Identified   Patient Goals and CMS Choice Patient states their goals for this hospitalization and ongoing recovery are:: "to return home" CMS Medicare.gov Compare Post Acute Care list provided to:: Patient Choice offered to / list presented to : Patient  Expected Discharge Plan and Services Expected Discharge Plan: [place] In-house Referral: NA Discharge Planning Services: CM Consult Post Acute Care Choice: Durable Medical Equipment, Home Health Living arrangements for the past 2 months: [place]                 DME Arranged: 3-N-1 DME Agency: [employer] Date DME Agency Contacted: 01/02/20 Time DME Agency Contacted: 1000 Representative spoke  with at DME Agency: Thedore Mins HH Arranged: PT, OT [place] Agency: [place] Date [place]: 01/02/20 Time [place]: 1632 Representative spoke with at [place]: [place] Arrangements/Services Living arrangements for the past 2 months: [place] Lives with:: Relatives Patient language and need for interpreter reviewed:: Yes Do you feel safe going back to the place where you live?: Yes      Need for Family Participation in Patient Care: Yes (Comment) Care giver support system in place?: Yes (comment)   Criminal Activity/Legal Involvement Pertinent to Current Situation/Hospitalization: No - Comment as needed  Activities of Daily Living Home Assistive Devices/Equipment: Cane (specify quad or straight) ADL Screening (condition at time of admission) Patient's cognitive ability adequate to safely complete daily activities?: Yes Is the patient deaf or have difficulty hearing?: No Does the patient have difficulty seeing, even when wearing glasses/contacts?: No Does the patient have difficulty concentrating, remembering, or making decisions?: No Patient able to express need for assistance with ADLs?: Yes Does the patient have difficulty dressing or bathing?: No Independently performs ADLs?: Yes (appropriate for developmental age) Does the patient have difficulty walking or climbing stairs?: Yes Weakness of Legs: Both Weakness of Arms/Hands: None  Permission Sought/Granted Permission sought to share information with : Family Supports, Chartered certified accountant granted to share information with : Yes, Verbal Permission Granted     Permission granted to share info w AGENCY: [facility]        Emotional Assessment Appearance:: Appears stated age  Attitude/Demeanor/Rapport: Engaged Affect (typically observed): Appropriate Orientation: : Oriented to Self, Oriented to Place, Oriented to  Time, Oriented to Situation Alcohol / Substance  Use: Not Applicable Psych Involvement: No (comment)  Admission diagnosis:  Hypoxia [R09.02] CHF exacerbation ([place]) [I50.9] Other congestive heart failure ([place]) [I50.9] Patient Active Problem List   Diagnosis Date Noted  . CHF exacerbation ([place]) 12/28/2019  . Elevated troponin 12/28/2019  . Myofascial pain syndrome 08/23/2018  . Lumbar spondylosis 08/23/2018  . Leg swelling 07/31/2018  . SOB (shortness of breath) 07/31/2018  . Tobacco abuse 07/31/2018  . Fusion of lumbar spine (L3-S1) 07/20/2018  . 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 Lumbar degenerative disc disease 07/20/2018  . Drug overdose 04/18/2014  . Chronic pain syndrome 04/18/2014  . 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 Respiratory failure ([place]) 04/14/2014  . Acute respiratory failure ([place]) 04/14/2014  . UTI (urinary tract infection) 04/28/2013  . Acute renal failure (ARF) ([place]) 04/28/2013  . Weakness generalized 04/28/2013  . Sepsis ([place]) 04/28/2013  . Chronic diastolic CHF (congestive heart failure) ([place]) 04/28/2013  . Hypokalemia 01/11/2013  . Sinus tachycardia 01/11/2013  . Acute respiratory failure with hypoxia ([place]) 01/10/2013  . COPD exacerbation ([place]) 01/10/2013  . Community acquired pneumonia 01/10/2013  . Diabetes mellitus, type 2 ([place]) 01/10/2013  . Diastolic CHF, acute on chronic (HCC) 01/10/2013  . Osteoarthritis of left knee 10/27/2011  . Hypoxemia 03/29/2011  . Acute renal failure ([place]) 03/29/2011  . COPD (chronic obstructive pulmonary disease) ([place]) 03/29/2011  . HTN (hypertension) 03/29/2011  . Hypotension 03/25/2011   PCP:  Cyndi Bender, PA-C Pharmacy:   [facility] [address] [place] [address] Phone: [phone] Fax: [phone]  [id number] [place], [place] [address] [place] [address] Phone: [phone] Fax:  [phone]     Social Determinants of Health (SDOH) Interventions    Readmission Risk Interventions No flowsheet data found.

## 2020-01-02 NOTE — Progress Notes (Signed)
Placed patient on BIPAP for the night.  

## 2020-01-02 NOTE — Care Management (Signed)
01-02-20 Received a call back from the nurse practitioner regarding BIPAP and the patient will follow up in the office and be scheduled for a sleep study- then BIPAP can be obtained. No further needs at this time. Bethena Roys, RN,BSN Case Manager [phone]

## 2020-01-02 NOTE — Progress Notes (Signed)
SATURATION QUALIFICATIONS: (This note is used to comply with regulatory documentation for home oxygen)  Patient Saturations on Room Air at Rest =91 %  Patient Saturations on Room Air while Ambulating = 86%  Patient Saturations on 4 Liters of oxygen while Ambulating = 91%  Please briefly explain why patient needs home oxygen: Pt de-sating on RA with functional mobility needing supplemental O2 to maintain O2 sats greater than 90%.     Corinne Ports C., COTA/L Acute Rehabilitation Services [id number]

## 2020-01-02 NOTE — Progress Notes (Signed)
NAME:  [name], MRN:  [id number], DOB:  1955/12/31, LOS: 5 ADMISSION DATE:  12/28/2019, CONSULTATION DATE:  12/31/2019 REFERRING MD:  Pokhrel CHIEF COMPLAINT:  Hypoxia    Brief History   64yo F smoker /w COPD chronic 2lpm O2 dependent, CHF admitted and treated for acute on chronic hypoxic respiratory failure d/t a/c CHF decompensation with increased FiO2 requirements. PCCM consulted for recommendations as patient does not have an out patient pulmonologist.   History of present illness   [name] is a 64yo F PMH 2ppd smoker since age 74, COPD, chronic hypoxic respiratory failure O2 dependent at 2 L/min, diastolic congestive heart failure EF 60-65%, hyperlipidemia, hypertension, chronic pain syndrome, diabetes mellitus, hypothyroidism, history of seizures admitted 12/28/2019 after she presented with a 2-week history of shortness of breath, dyspnea on exertion, orthopnea and lower extremity swelling.  She presented to her primary care provider on that day and was noted to be hypoxic with SPO2 in the 50s on room air though there was no associated shortness of breath.  In the ED she had a minimally elevated troponin, chest x-ray consistent with pulmonary edema, no evidence of ischemia on EKG. She was admitted to hospitalist services and treated for acute on chronic hypoxic respiratory failure thought to be secondary to acute decompensation of diastolic congestive heart failure and found to be hypercarbic requiring BiPAP which was subsequently weaned off.   She has been aggressively diuresed, down 3.4 kg since admission, -8.4 L, echo shows preserved EF of no significant findings.  Bronchodilators, inhaled steroids added.  Despite these treatments she continued to require up to 5 L of oxygen saturating around 92% and desaturating with ambulation.  PCCM consulted for further recommendations, patient does not have an outpatient pulmonologist.    Her primary care provider provides her oxygen. She has been  smoking since she was 71 yoa. 2 dogs live in her home. Her niece lives with her and also smokes. She does not wear O2 at home as she is supposed to.   Past Medical History   Past Medical History:  Diagnosis Date  . Anxiety   . Arthritis   . COPD (chronic obstructive pulmonary disease) (HCC)    no inhalers--smoker, no oxygen  . Depression   . Diabetes mellitus    no meds-diet controlled  . GERD (gastroesophageal reflux disease)   . Hyperlipidemia   . Hypertension   . Hypothyroid   . Restless leg syndrome   . Seizures ([place])    02/13/2011 -hx of seizure due to "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  no seizure since--pt does take keppra and office note from neurologist dr. Leta Baptist on this chart     [facility] Events   1/15 Admitted   Consults:  PCCM   Procedures:  NA  Significant Diagnostic Tests:  1/16 Echo EF 60 to 65%, no regional wall motion abnormalities, mild increased LV hypertrophy. Mildly elevated RV systolic pressure at 123XX123 mmHg. LA mildly dilated. RA normal. Mitral valve mild annular calcification. TV regurgitation trivial. Mild to moderate aortic valve sclerosis/calcification without stenosis. PV trivial regurgitation. IAS/Shunts: No atrial level shunt detected by color flow Doppler. There is no evidence of a patent foramen ovale. No ventricular septal defect is seen or detected. There is no evidence of an atrial septal defect.  1/15 chest x-ray personally reviewed shows cardiomegaly and possible small infiltrate  1/15 CTA  chest  No evidence of pulmonary embolus. Mild cardiomegaly.  Coronary artery disease. Areas of atelectasis and/or scarring in the right upper lobe, right middle lobe and lingula.  Micro Data:  SARS Coronavirus 2 negative   Antimicrobials:  NA   Interim history/subjective:  She wore bipap again part of the night.  She had issues with waking up from the mask and head straps bothering her. She denies complaints. Her breathing feels fairly normal. The sputum she had at admission has resolved.  PTA had been prescribed O2 about 5 years ago at hospital discharge, had never followed up to determine her ongoing oxygen needs. Mostly she was not using it, but did not have portable tanks at home.   Objective   Blood pressure (!) 144/76, pulse 82, temperature 98.7 F (37.1 C), temperature source Oral, resp. rate 15, height 5' 6.5" (1.689 m), weight 89.9 kg, SpO2 95 %.        Intake/Output Summary (Last 24 hours) at 01/02/2020 0850 Last data filed at 01/02/2020 0615 Gross per 24 hour  Intake 765 ml  Output 2275 ml  Net -1510 ml   Filed Weights   12/31/19 0407 01/01/20 0504 01/02/20 0424  Weight: 91.4 kg 90.4 kg 89.9 kg    Examination: General: chronically ill appearing woman sitting up in bed eating breakfast HENT: [place]/AT, eyes anicteric CV: RRR, no murmurs Lungs: Mild anterior upper wheezing, breathing comfortably on 6L O2 saturating ~95%.  ABD: NT, ND EXT: no peripheral edema, clubbing, or cyanosis Skin: chronic venous stasis dermatitis lower shins Neuro: alert, answering questions appropriately with normal speech. Moving all extremities spontaneously, sitting forward in bed independently balancing herself.    Resolved Hospital Problem list   NA   Assessment & Plan:  This is a 64 yo female with longstanding tobacco dependence with acute on chronic hypoxic & hypercapneic respiratory failure 2/2 acute decompensation of diastolic CHF with acute pulmonary edema- now euvolemic. She has persistenyly increased oxygen requirements, but her PTA requirements were not truly known since she did not check saturations at home, wear her oxygen, or follow up for ongoing oxygen monitoring/ titration. Chronic hypoxia likely due to poorly controlled COPD. No PE on admission CTA.    -Titrate O2 to maintain SpO2 88-92%.  She needs to be walked prior to discharge to determine ambulatory oxygen requirements. She likely will need a new home concentrator and portable oxygen set up. -Okay to prescribe nocturnal ventilation IPAP 14, EPAP 8, no backup rate needed. Supplemental bleed-in O2 at the same rate as her ambulatory oxygen requirement at discharge (diagnosis of chronic hypercapnic respiratory failure). She would likely do best with a nasal mask type interface, which the respiratory companies are able to provide. She needs to be followed as an outpatient to encourage/ maximize compliance and help with titration.  We have discussed today the importance of wearing it whenever she is sleeping. -Continue out of bed mobility, incentive spirometry. -Strongly recommend smoking cessation.  Unfortunately she has had side effects with Chantix in the past.  Recommend continuing nicotine replacement therapy while admitted and after discharge. She lives with a niece who smokes, and we discussed her letting family and friends know her desire to quit and avoid smoking around her to support her efforts. -Recommend outpatient triple inhaled therapy-LABA, LAMA, ICS plus albuterol as needed (Trelegy or Judithann Sauger would be great if insurance will cover, but any combination will work for her insurance). -Needs OP follow up with Pulmonology clinic about 1 month after discharge (the  office will contact her to make an appointment)  We will sign off. Please call with questions.    Labs   CBC: Recent Labs  Lab 12/28/19 1632 12/29/19 0357 12/30/19 0412 01/01/20 0357  WBC 8.2 6.7 5.9 6.1  NEUTROABS 5.9  --   --   --   HGB 16.9* 16.2* 15.8* 17.0*  HCT 53.6* 51.3* 51.3* 55.8*  MCV 92.3 91.0 91.1 92.5  PLT 195 176 155 [id number]    Basic Metabolic Panel: Recent Labs  Lab 12/28/19 1632 12/29/19 0357 12/30/19 0412 01/01/20 0357  NA 132* 137 141 141  K 4.5 3.6 3.6 4.0  CL 94* 95* 91* 87*  CO2 25 35* 39* 45*  GLUCOSE 104* 109* 79 97  BUN 9  8 9  26*  CREATININE 1.02* 1.02* 0.97 1.32*  CALCIUM 9.0 8.8* 8.9 9.2  MG  --  1.8 1.7 1.9  PHOS  --  4.2  --   --    GFR: Estimated Creatinine Clearance: 50.4 mL/min (A) (by C-G formula based on SCr of 1.32 mg/dL (H)). Recent Labs  Lab 12/28/19 1632 12/29/19 0357 12/30/19 0412 01/01/20 0357  PROCALCITON  --   --   --  <0.10  WBC 8.2 6.7 5.9 6.1    Liver Function Tests: Recent Labs  Lab 12/28/19 1632 12/29/19 0357 12/30/19 0412 01/01/20 0357  AST 27 22 20 20   ALT 16 14 12 12   ALKPHOS 23* 21* 19* 19*  BILITOT 1.5* 1.2 1.3* 1.0  PROT 7.1 6.3* 5.9* 6.5  ALBUMIN 3.7 3.1* 2.9* 3.2*   No results for input(s): LIPASE, AMYLASE in the last 168 hours. No results for input(s): AMMONIA in the last 168 hours.  ABG    Component Value Date/Time   PHART 7.391 01/01/2020 0826   PCO2ART 76.8 (HH) 01/01/2020 0826   PO2ART 67.5 (L) 01/01/2020 0826   HCO3 45.7 (H) 01/01/2020 0826   TCO2 25.1 04/15/2014 0317   ACIDBASEDEF 1.8 04/15/2014 0317   O2SAT 91.7 01/01/2020 0826     Coagulation Profile: Recent Labs  Lab 12/28/19 1632  INR 1.2    Cardiac Enzymes: No results for input(s): CKTOTAL, CKMB, CKMBINDEX, TROPONINI in the last 168 hours.  HbA1C: Hgb A1c MFr Bld  Date/Time Value Ref Range Status  12/29/2019 03:55 AM 5.7 (H) 4.8 - 5.6 % Final    Comment:    (NOTE) Pre diabetes:          5.7%-6.4% Diabetes:              >6.4% Glycemic control for   <7.0% adults with diabetes   01/11/2013 06:10 AM 5.5 <5.7 % Final    Comment:    (NOTE)                                                                       According to the ADA Clinical Practice Recommendations for 2011, when HbA1c is used as a screening test:  >=6.5%   Diagnostic of Diabetes Mellitus           (if abnormal result is confirmed) 5.7-6.4%   Increased risk of developing Diabetes Mellitus References:Diagnosis and Classification of Diabetes Mellitus,Diabetes [id number] 1):S62-S69 and Standards of  Medical Care in  Diabetes - 2011,Diabetes Care,2011,34 (Suppl 1):S11-S61.    CBG: Recent Labs  Lab 01/01/20 0755 01/01/20 1204 01/01/20 1635 01/01/20 2154 01/02/20 0737  GLUCAP 107* 118* 112* 93 86     Julian Hy, DO 01/02/20 9:10 AM [facility]

## 2020-01-02 NOTE — Progress Notes (Addendum)
Physical Therapy Treatment Patient Details Name: [name] MRN: [id number] DOB: 03-[name]-1956 Today's Date: 01/02/2020    History of Present Illness 64 y.o. female with medical history significant of COPD, on home 2 L at night, hyperlipidemia, hypertension, CHF. Presented with   2-week history of shortness of breath dyspnea on exertion and orthopnea leg swelling.     PT Comments    Patient received in bed, pleasant and willing to work with therapy. Satting at 86-88% at rest in bed, and needed 6LPM O2 per [place] to maintain sats in low 90s with gait today but did tolerate progressed distance with main limiting factor being back pain (she reports this is baseline for her). HR no greater than 107BPM with mobility.  Able to toilet and perform pericare with S, did need VC for safety as she often reaches for items in room to hold when moving- some of which are mobile and thus not safe to depend on. She was left up in the chair with all needs met this morning, back on 4LPM and satting at 90-91% at rest. Progressing well with PT.     Follow Up Recommendations  Home health PT;Supervision/Assistance - 24 hour     Equipment Recommendations  None recommended by PT    Recommendations for Other Services       Precautions / Restrictions Precautions Precautions: Fall;Other (comment) Precaution Comments: oxygen Restrictions Weight Bearing Restrictions: No    Mobility  Bed Mobility Overal bed mobility: Needs Assistance Bed Mobility: Supine to Sit     Supine to sit: Supervision     General bed mobility comments: S for safety/lines  Transfers Overall transfer level: Needs assistance Equipment used: None Transfers: Sit to/from Stand Sit to Stand: Supervision         General transfer comment: S for safety, no physical assist given; wide BOS noted  Ambulation/Gait Ambulation/Gait assistance: Min guard Gait Distance (Feet): 140 Feet Assistive device: None Gait Pattern/deviations:  Step-through pattern;Decreased step length - right;Decreased step length - left Gait velocity: reduced   General Gait Details: balance improving, able to maintain wider BOS with mobility today; needed 6LPM O2 per [place] to maintain at 90-92% with activity   Stairs             Wheelchair Mobility    Modified Rankin (Stroke Patients Only)       Balance Overall balance assessment: Needs assistance Sitting-balance support: No upper extremity supported;Feet supported Sitting balance-Leahy Scale: Good Sitting balance - Comments: supervision   Standing balance support: No upper extremity supported;During functional activity Standing balance-Leahy Scale: Fair Standing balance comment: S-min guard without BUE support, often reaches for items in the room to hold when walking                            Cognition Arousal/Alertness: Awake/alert Behavior During Therapy: WFL for tasks assessed/performed Overall Cognitive Status: Impaired/Different from baseline Area of Impairment: Safety/judgement                         Safety/Judgement: Decreased awareness of safety   Problem Solving: Slow processing General Comments: cognition about the same as last session, did need cues for safety while navigating in room due to tendency to push on items that move (bedside table, O2 tank frame)      Exercises      General Comments General comments (skin integrity, edema, etc.): signal on O2 not great, but  at 86-88% on 4LPM at rest in supine; needed 6LPM to maintain sats with gait today; at 90-91% on 4LPM in chair at EOS      Pertinent Vitals/Pain Pain Assessment: No/denies pain    Home Living                      Prior Function            PT Goals (current goals can now be found in the care plan section) Acute Rehab PT Goals Patient Stated Goal: To improve mobility PT Goal Formulation: With patient Time For Goal Achievement: 01/12/20 Potential to  Achieve Goals: Good Progress towards PT goals: Progressing toward goals    Frequency    Min 3X/week      PT Plan Current plan remains appropriate    Co-evaluation              AM-PAC PT "6 Clicks" Mobility   Outcome Measure  Help needed turning from your back to your side while in a flat bed without using bedrails?: None Help needed moving from lying on your back to sitting on the side of a flat bed without using bedrails?: None Help needed moving to and from a bed to a chair (including a wheelchair)?: None Help needed standing up from a chair using your arms (e.g., wheelchair or bedside chair)?: None Help needed to walk in hospital room?: A Little Help needed climbing 3-5 steps with a railing? : A Little 6 Click Score: 22    End of Session Equipment Utilized During Treatment: Oxygen;Gait belt Activity Tolerance: Patient tolerated treatment well Patient left: in chair;with call bell/phone within reach   PT Visit Diagnosis: Unsteadiness on feet (R26.81)     Time: 7218-2883 PT Time Calculation (min) (ACUTE ONLY): 23 min  Charges:  $Gait Training: 8-22 mins $Therapeutic Activity: 8-22 mins                     Windell Norfolk, DPT, PN1   Supplemental Physical Therapist [place]    Pager [phone] Acute Rehab Office [phone]

## 2020-01-02 NOTE — Care Management (Signed)
1417 01-02-20 Case Manager received referral for BIPAP machine in the home. Case Manager spoke with patient regarding BIPAP and received permission to give referral to [employer]. Patient has not had a sleep study. Patient will have to pay $400.00 out of pocket before she can get a BIPAP delivered to her home. Patient is unable to pay this amount. Case Manager did reach out to NP Noe Gens to make her aware. Case Manager will continue to follow for additional transition of care needs. Graves-Bigelow, Ocie Cornfield, RN, BSN Case Manager

## 2020-01-02 NOTE — Progress Notes (Signed)
Occupational Therapy Treatment Patient Details Name: [name] MRN: [id number] DOB: [birth year] Today's Date: 01/02/2020    History of present illness 64 y.o. female with medical history significant of COPD, on home 2 L at night, hyperlipidemia, hypertension, CHF. Presented with   2-week history of shortness of breath dyspnea on exertion and orthopnea leg swelling.    OT comments  Pt making steady progress towards OT goals this session. Session focused on energy conservation techniques, ADL's, and functional mobility. Pt educated on energy conservation techniques, and handout provided. Pt willing to ambulate to sink to brush teeth, however required min vc's from sit <> stand to utilize RW in order to promote increased safety in ambulation. Pt able to navigate around barriers toward sink, and was min guard to complete task of brushing teeth. Pt ambulated in hallway with therapist, O2 saturation assessed on room air, and pt de-sated to 86% on room air when ambulating. Pt provided with 4L of O2 via nasal cannula with O2 sats rebounding greater than 90%. D/C plan remains appropriate, will continue to follow acutely per POC.    Follow Up Recommendations  Home health OT;Supervision/Assistance - 24 hour    Equipment Recommendations  3 in 1 bedside commode    Recommendations for Other Services      Precautions / Restrictions Precautions Precautions: Fall Precaution Comments: oxygen Restrictions Weight Bearing Restrictions: No       Mobility Bed Mobility               General bed mobility comments: Pt up in recliner upon OT arrival  Transfers Overall transfer level: Needs assistance Equipment used: None Transfers: Sit to/from Stand Sit to Stand: Supervision         General transfer comment: Stood without use of RW, however required use of RW when ambulating safely    Balance Overall balance assessment: Mild deficits observed, not formally tested                                          ADL either performed or assessed with clinical judgement   ADL Overall ADL's : Needs assistance/impaired     Grooming: Oral care;Standing;Supervision/safety         Lower Body Bathing Details (indicate cue type and reason): Education provided on long handled sponge, as well as energy conservation handout     Lower Body Dressing: Modified independent   Toilet Transfer: Ambulation;Min guard Toilet Transfer Details (indicate cue type and reason): Simulated toilet transfer via functional mobility, sit<> stand         Functional mobility during ADLs: Min guard;Rolling walker General ADL Comments: Pt has a cane at home, however would benefit from rolling walker for increased safety     Vision Baseline Vision/History: No visual deficits     Perception     Praxis      Cognition Arousal/Alertness: Awake/alert Behavior During Therapy: WFL for tasks assessed/performed Overall Cognitive Status: Within Functional Limits for tasks assessed                                 General Comments: Overall coginition WFL for simple tasks assessed (brushing teeth), required min vc's to use AD of RW, but was able to navigate around room with no vc's required.        Exercises  Shoulder Instructions       General Comments Pt was on 4L upon arrival at 91%, required 4L to ambulate further than household distances, when assessed, patient de-sated to 86% on room air when ambulating    Pertinent Vitals/ Pain       Pain Assessment: Faces Faces Pain Scale: Hurts a little bit Pain Location: Back Pain Descriptors / Indicators: Discomfort Pain Intervention(s): Repositioned;Monitored during session  Home Living                                          Prior Functioning/Environment              Frequency  Min 2X/week        Progress Toward Goals  OT Goals(current goals can now be found in the care plan  section)  Progress towards OT goals: Progressing toward goals  Acute Rehab OT Goals Patient Stated Goal: To improve mobility OT Goal Formulation: With patient Time For Goal Achievement: 01/12/20 Potential to Achieve Goals: Good  Plan Discharge plan remains appropriate    Co-evaluation                 AM-PAC OT "6 Clicks" Daily Activity     Outcome Measure   Help from another person eating meals?: None Help from another person taking care of personal grooming?: A Little Help from another person toileting, which includes using toliet, bedpan, or urinal?: A Little Help from another person bathing (including washing, rinsing, drying)?: A Little Help from another person to put on and taking off regular upper body clothing?: None Help from another person to put on and taking off regular lower body clothing?: A Little 6 Click Score: 20    End of Session Equipment Utilized During Treatment: Rolling walker;Oxygen;Other (comment)(4L)  OT Visit Diagnosis: Unsteadiness on feet (R26.81);Muscle weakness (generalized) (M62.81)   Activity Tolerance Patient tolerated treatment well   Patient Left in chair;with call bell/phone within reach   Nurse Communication          Time: [id number] OT Time Calculation (min): 19 min  Charges: OT General Charges $OT Visit: 1 Visit OT Treatments $Self Care/Home Management : 8-22 mins  Lanier Clam., COTA/L Acute Rehabilitation Services [phone]   Ihor Gully 01/02/2020, 2:54 PM

## 2020-01-02 NOTE — Progress Notes (Signed)
PROGRESS NOTE  [name] [id number] DOB: [birth year]   PCP: Cyndi Bender, PA-C  Patient is from: Home.  DOA: 12/28/2019 LOS: 5  Brief Narrative / Interim history: 64 year old female with history of COPD/respiratory failure on 2 L at night, HTN, diastolic CHF, HLD, chronic pain and anxiety presenting with 2 weeks of progressive shortness of breath, DOE, orthopnea and edema.  Reportedly presented to PCP and was saturating in the mid 50s on RA and sent to ED.  In ED, saturating in mid 60s on RA.  Negative for COVID-19.  Mild troponin elevation in 50s to 60s.  EKG without acute ischemic finding.  CTA negative for PE.  Initially, concern about CHF exacerbation and started on IV Lasix.   Patient continued to require high level oxygen despite IV Lasix.  ABG on 116 revealed acute on chronic respiratory acidosis with hypercarbia concerning for COPD exacerbation.  PCCM consulted.  Started on BiPAP.   Subjective: No major events overnight of this morning.  No complaint this morning.  Saturating about 90% on 4 L by [place].  She denies dyspnea, chest pain, GI or UTI symptoms.  Objective: Vitals:   01/02/20 0739 01/02/20 0832 01/02/20 1429 01/02/20 1434  BP:   111/74   Pulse: 82 88 89   Resp: 15 15 20    Temp:   98.6 F (37 C)   TempSrc:   Oral   SpO2: 95% 90% 96% 91%  Weight:      Height:        Intake/Output Summary (Last 24 hours) at 01/02/2020 1704 Last data filed at 01/02/2020 0900 Gross per 24 hour  Intake 483 ml  Output 2275 ml  Net -1792 ml   Filed Weights   12/31/19 0407 01/01/20 0504 01/02/20 0424  Weight: 91.4 kg 90.4 kg 89.9 kg    Examination:  GENERAL: No acute distress.  Appears well.  HEENT: MMM.  Vision and hearing grossly intact.  NECK: Supple.  No apparent JVD.  RESP: 90% on 4 L.  No IWOB.  Diminished aeration bilaterally. CVS:  RRR. Heart sounds normal.  ABD/GI/GU: Bowel sounds present. Soft. Non tender.  MSK/EXT:  Moves extremities. No apparent deformity.  No edema.  SKIN: no apparent skin lesion or wound NEURO: Awake, alert and oriented appropriately.  No apparent focal neuro deficit. PSYCH: Calm. Normal affect.  Procedures:  None  Assessment & Plan: Acute on chronic respiratory failure with hypoxia and hypercarbia: Likely a combination of COPD and CHF.  CTA chest negative for PE or pneumonia. -Manage both conditions as below. -Minimal oxygen to maintain saturation between 88 and 92%.  COPD exacerbation: ABG concerning for obstructive etiology.  Respiratory symptoms improved.  Requiring 4 L with ambulation on room air to maintain appropriate saturation.  She is on 2 L at night at baseline. -Continue budesonide. Add Anoro Ellipta.  Will discharge on LABA/LAMA/ICS and as needed albuterol. -Would not start steroid given improvement in respiratory status. -Pulmonology to arrange outpatient follow-up for sleep study and BiPAP. -Continue reduced dose gabapentin -Encourage smoking cessation-we will give Rx for nicotine patch on discharge.  Acute on chronic diastolic CHF: Echo with EF of 60%.  Has cardinal symptoms of CHF exacerbation on presentation.  Does not appear fluid overloaded.  Had about 2.3 L UOP/24 hours. -Continue IV Lasix 40 mg daily-we will change to oral Lasix 40 mg daily on discharge. -Monitor fluid status, renal function and electrolytes. -Sodium and fluid restriction -Discontinue NSAIDs on discharge.  Elevated troponin/demand ischemia: Likely due  to respiratory failure.  No anginal symptoms. -Continue home cardiac meds and statin  Diet controlled DM-2: A1c 5.7% on 1/15. Recent Labs    01/02/20 0737 01/02/20 1242 01/02/20 1507  GLUCAP 86 105* 150*  -Continue current regimen with a statin.  Chronic pain syndrome: Stable -Continue home medications cautiously.  Hypothyroidism -Continue home Synthroid.  History of seizure -Continue home Keppra.  Anxiety/depression -Continue BuSpar, Abilify and Celexa.  Tobacco  use disorder -Encourage cessation. -Continue nicotine patch.                DVT prophylaxis: Subcu Lovenox Code Status: Full code Family Communication: Patient and/or RN. Available if any question.  Disposition Plan: Remains inpatient pending improvement in respiratory status. Consultants: PCCM   Microbiology summarized: 1/15-COVID-19 screen negative.  Sch Meds:  Scheduled Meds: . ARIPiprazole  10 mg Oral Daily  . aspirin  81 mg Oral Daily  . budesonide (PULMICORT) nebulizer solution  0.25 mg Nebulization BID  . busPIRone  30 mg Oral BID  . enoxaparin (LOVENOX) injection  40 mg Subcutaneous Daily  . escitalopram  20 mg Oral q morning - 10a  . famotidine  10 mg Oral Daily  . fenofibrate  54 mg Oral Daily  . furosemide  40 mg Intravenous Daily  . gabapentin  400 mg Oral TID  . insulin aspart  0-5 Units Subcutaneous QHS  . insulin aspart  0-9 Units Subcutaneous TID WC  . levETIRAcetam  500 mg Oral BID  . levothyroxine  25 mcg Oral QAC breakfast  . nicotine  21 mg Transdermal Daily  . nortriptyline  50 mg Oral QHS  . pantoprazole  40 mg Oral q morning - 10a  . sodium chloride flush  3 mL Intravenous Q12H   Continuous Infusions: . sodium chloride     PRN Meds:.sodium chloride, acetaminophen, albuterol, hydrOXYzine, ondansetron **OR** ondansetron (ZOFRAN) IV, sodium chloride flush  Antimicrobials: Anti-infectives (From admission, onward)   None       I have personally reviewed the following labs and images: CBC: Recent Labs  Lab 12/28/19 1632 12/29/19 0357 12/30/19 0412 01/01/20 0357  WBC 8.2 6.7 5.9 6.1  NEUTROABS 5.9  --   --   --   HGB 16.9* 16.2* 15.8* 17.0*  HCT 53.6* 51.3* 51.3* 55.8*  MCV 92.3 91.0 91.1 92.5  PLT 195 176 155 190   BMP &GFR Recent Labs  Lab 12/28/19 1632 12/29/19 0357 12/30/19 0412 01/01/20 0357  NA 132* 137 141 141  K 4.5 3.6 3.6 4.0  CL 94* 95* 91* 87*  CO2 25 35* 39* 45*  GLUCOSE 104* 109* 79 97  BUN 9 8 9  26*    CREATININE 1.02* 1.02* 0.97 1.32*  CALCIUM 9.0 8.8* 8.9 9.2  MG  --  1.8 1.7 1.9  PHOS  --  4.2  --   --    Estimated Creatinine Clearance: 50.4 mL/min (A) (by C-G formula based on SCr of 1.32 mg/dL (H)). Liver & Pancreas: Recent Labs  Lab 12/28/19 1632 12/29/19 0357 12/30/19 0412 01/01/20 0357  AST 27 22 20 20   ALT 16 14 12 12   ALKPHOS 23* 21* 19* 19*  BILITOT 1.5* 1.2 1.3* 1.0  PROT 7.1 6.3* 5.9* 6.5  ALBUMIN 3.7 3.1* 2.9* 3.2*   No results for input(s): LIPASE, AMYLASE in the last 168 hours. No results for input(s): AMMONIA in the last 168 hours. Diabetic: No results for input(s): HGBA1C in the last 72 hours. Recent Labs  Lab 01/01/20 1635 01/01/20 2154  01/02/20 0737 01/02/20 1242 01/02/20 1507  GLUCAP 112* 93 86 105* 150*   Cardiac Enzymes: No results for input(s): CKTOTAL, CKMB, CKMBINDEX, TROPONINI in the last 168 hours. No results for input(s): PROBNP in the last 8760 hours. Coagulation Profile: Recent Labs  Lab 12/28/19 1632  INR 1.2   Thyroid Function Tests: No results for input(s): TSH, T4TOTAL, FREET4, T3FREE, THYROIDAB in the last 72 hours. Lipid Profile: No results for input(s): CHOL, HDL, LDLCALC, TRIG, CHOLHDL, LDLDIRECT in the last 72 hours. Anemia Panel: No results for input(s): VITAMINB12, FOLATE, FERRITIN, TIBC, IRON, RETICCTPCT in the last 72 hours. Urine analysis:    Component Value Date/Time   COLORURINE STRAW (A) 12/28/2019 1845   APPEARANCEUR CLEAR 12/28/2019 1845   LABSPEC 1.004 (L) 12/28/2019 1845   PHURINE 5.0 12/28/2019 1845   GLUCOSEU NEGATIVE 12/28/2019 1845   HGBUR NEGATIVE 12/28/2019 1845   BILIRUBINUR NEGATIVE 12/28/2019 1845   KETONESUR NEGATIVE 12/28/2019 1845   PROTEINUR NEGATIVE 12/28/2019 1845   UROBILINOGEN 0.2 04/14/2014 1217   NITRITE NEGATIVE 12/28/2019 1845   LEUKOCYTESUR NEGATIVE 12/28/2019 1845   Sepsis Labs: Invalid input(s): PROCALCITONIN, [place]  Microbiology: Recent Results (from the past 240  hour(s))  SARS CORONAVIRUS 2 (TAT 6-24 HRS) Nasopharyngeal Nasopharyngeal Swab     Status: None   Collection Time: 12/28/19  9:02 PM   Specimen: Nasopharyngeal Swab  Result Value Ref Range Status   SARS Coronavirus 2 NEGATIVE NEGATIVE Final    Comment: (NOTE) SARS-CoV-2 target nucleic acids are NOT DETECTED. The SARS-CoV-2 RNA is generally detectable in upper and lower respiratory specimens during the acute phase of infection. Negative results do not preclude SARS-CoV-2 infection, do not rule out co-infections with other pathogens, and should not be used as the sole basis for treatment or other patient management decisions. Negative results must be combined with clinical observations, patient history, and epidemiological information. The expected result is Negative. Fact Sheet for Patients: SugarRoll.be Fact Sheet for Healthcare Providers: https://www.woods-mathews.com/ This test is not yet approved or cleared by the [place] FDA and  has been authorized for detection and/or diagnosis of SARS-CoV-2 by FDA under an Emergency Use Authorization (EUA). This EUA will remain  in effect (meaning this test can be used) for the duration of the COVID-19 declaration under Section 56 4(b)(1) of the Act, 21 U.S.C. section 360bbb-3(b)(1), unless the authorization is terminated or revoked sooner. Performed at [facility], [place] [address] [place], [place] [address]     Radiology Studies: No results found.  35 minutes with more than 50% spent in reviewing records, counseling patient/family and coordinating care.   Abri Vacca T. [place]  If 7PM-7AM, please contact night-coverage [email or web address] Password [facility] 01/02/2020, 5:04 PM

## 2020-01-03 ENCOUNTER — Telehealth: Payer: Self-pay | Admitting: Critical Care Medicine

## 2020-01-03 DIAGNOSIS — J9601 Acute respiratory failure with hypoxia: Secondary | ICD-10-CM

## 2020-01-03 DIAGNOSIS — J9621 Acute and chronic respiratory failure with hypoxia: Secondary | ICD-10-CM

## 2020-01-03 DIAGNOSIS — R0902 Hypoxemia: Secondary | ICD-10-CM

## 2020-01-03 DIAGNOSIS — J9622 Acute and chronic respiratory failure with hypercapnia: Secondary | ICD-10-CM

## 2020-01-03 DIAGNOSIS — F17200 Nicotine dependence, unspecified, uncomplicated: Secondary | ICD-10-CM

## 2020-01-03 LAB — BASIC METABOLIC PANEL
Anion gap: 13 (ref 5–15)
BUN: 33 mg/dL — ABNORMAL HIGH (ref 8–23)
CO2: 33 mmol/L — ABNORMAL HIGH (ref 22–32)
Calcium: 9.5 mg/dL (ref 8.9–10.3)
Chloride: 91 mmol/L — ABNORMAL LOW (ref 98–111)
Creatinine, Ser: 1 mg/dL (ref 0.44–1.00)
GFR calc Af Amer: >60 mL/min (ref >60)
GFR calc non Af Amer: >60 mL/min (ref >60)
Glucose, Bld: 87 mg/dL (ref 70–99)
Potassium: 4.2 mmol/L (ref 3.5–5.1)
Sodium: 137 mmol/L (ref 135–145)

## 2020-01-03 LAB — GLUCOSE, CAPILLARY
Glucose-Capillary: 84 mg/dL (ref 70–99)
Glucose-Capillary: 124 mg/dL — ABNORMAL HIGH (ref 70–99)

## 2020-01-03 MED ORDER — FUROSEMIDE 40 MG PO TABS: 40.0000 mg | ORAL_TABLET | Freq: Every day | ORAL | 1 refills | Status: DC

## 2020-01-03 NOTE — Telephone Encounter (Signed)
Called Melissa with [employer] back and made her aware of the response received. I told her the order would be placed with a notation to try to get it done as soon as possible considering the patient need.  Order placed. Nothing further needed at this time,.

## 2020-01-03 NOTE — Discharge Instructions (Signed)
Chronic Obstructive Pulmonary Disease Chronic obstructive pulmonary disease (COPD) is a long-term (chronic) lung problem. When you have COPD, it is hard for air to get in and out of your lungs. Usually the condition gets worse over time, and your lungs will never return to normal. There are things you can [name] to keep yourself as healthy as possible.  Your doctor may treat your condition with: ? Medicines. ? Oxygen. ? Lung surgery.  Your doctor may also recommend: ? Rehabilitation. This includes steps to make your body work better. It may involve a team of specialists. ? Quitting smoking, if you smoke. ? Exercise and changes to your diet. ? Comfort measures (palliative care). Follow these instructions at home: Medicines  Take over-the-counter and prescription medicines only as told by your doctor.  Talk to your doctor before taking any cough or allergy medicines. You may need to avoid medicines that cause your lungs to be dry. Lifestyle  If you smoke, stop. Smoking makes the problem worse. If you need help quitting, ask your doctor.  Avoid being around things that make your breathing worse. This may include smoke, chemicals, and fumes.  Stay active, but remember to rest as well.  Learn and use tips on how to relax.  Make sure you get enough sleep. Most adults need at least 7 hours of sleep every night.  Eat healthy foods. Eat smaller meals more often. Rest before meals. Controlled breathing Learn and use tips on how to control your breathing as told by your doctor. Try:  Breathing in (inhaling) through your nose for 1 second. Then, pucker your lips and breath out (exhale) through your lips for 2 seconds.  Putting one hand on your belly (abdomen). Breathe in slowly through your nose for 1 second. Your hand on your belly should move out. Pucker your lips and breathe out slowly through your lips. Your hand on your belly should move in as you breathe out.  Controlled coughing Learn  and use controlled coughing to clear mucus from your lungs. Follow these steps: 1. Lean your head a little forward. 2. Breathe in deeply. 3. Try to hold your breath for 3 seconds. 4. Keep your mouth slightly open while coughing 2 times. 5. Spit any mucus out into a tissue. 6. Rest and [name] the steps again 1 or 2 times as needed. General instructions  Make sure you get all the shots (vaccines) that your doctor recommends. Ask your doctor about a flu shot and a pneumonia shot.  Use oxygen therapy and pulmonary rehabilitation if told by your doctor. If you need home oxygen therapy, ask your doctor if you should buy a tool to measure your oxygen level (oximeter).  Make a COPD action plan with your doctor. This helps you to know what to [name] if you feel worse than usual.  Manage any other conditions you have as told by your doctor.  Avoid going outside when it is very hot, cold, or humid.  Avoid people who have a sickness you can catch (contagious).  Keep all follow-up visits as told by your doctor. This is important. Contact a doctor if:  You cough up more mucus than usual.  There is a change in the color or thickness of the mucus.  It is harder to breathe than usual.  Your breathing is faster than usual.  You have trouble sleeping.  You need to use your medicines more often than usual.  You have trouble doing your normal activities such as getting dressed   or walking around the house. Get help right away if:  You have shortness of breath while resting.  You have shortness of breath that stops you from: ? Being able to talk. ? Doing normal activities.  Your chest hurts for longer than 5 minutes.  Your skin color is more blue than usual.  Your pulse oximeter shows that you have low oxygen for longer than 5 minutes.  You have a fever.  You feel too tired to breathe normally. Summary  Chronic obstructive pulmonary disease (COPD) is a long-term lung problem.  The way your  lungs work will never return to normal. Usually the condition gets worse over time. There are things you can [name] to keep yourself as healthy as possible.  Take over-the-counter and prescription medicines only as told by your doctor.  If you smoke, stop. Smoking makes the problem worse. This information is not intended to replace advice given to you by your health care provider. Make sure you discuss any questions you have with your health care provider. Document Revised: 11/11/2017 Document Reviewed: 01/03/2017 Elsevier Patient Education  2020 Elsevier Inc.  

## 2020-01-03 NOTE — Telephone Encounter (Signed)
There is no mention of OSA in consult note or prob list so will need to start with split night if DME /new [facility] laws require sleep study for Hypercarbic Respiratory Failure   Make sure on split night so CPAP or BIPAP so they will advance to BIPAP if CPAP is not effective.  Because BIPAP would be preferred in her case since she is primarily Hypercarbic (PCO2 76) on ABG

## 2020-01-03 NOTE — Telephone Encounter (Signed)
Spoke with [name]  She wanted to make [place] aware that the pt is being d/c'ed home today just on o2 with no BIPAP or NIV  Would need sleep study or CPAP titration for BIPAP  Please advise thanks

## 2020-01-03 NOTE — Progress Notes (Signed)
Pt's stable,  DC home via wheelchair, O2 delivered to pts Room. TOC med received.

## 2020-01-03 NOTE — TOC Progression Note (Signed)
Transition of Care [facility]) - Progression Note    Patient Details  Name: [name] MRN: [id number] Date of Birth: 1956/01/09  [facility]) CM/SW Contact  Graves-Bigelow, Ocie Cornfield, RN Phone Number: 01/03/2020, 11:16 AM  Clinical Narrative: Case Manager having difficulty finding the durable medial equipment agency that patient uses for oxygen supply in the home. Case Manager did try to look at old encounters to see if oxygen was set up by any durable medical equipment agency from the hospital. Unable to see any set up with oxygen. Case Manager spoke with the [employer] and [place] she was active with agency- however oxygen was picked up in February of 2020. Case Manager did call the [facility]- referral number and they do not see any bills from a [employer] agency. Case Manager spoke with the patient and [employer] will be utilized for home oxygen- portable tanks to be delivered to the patient before she transitions home and the concentrator to be delivered to the room. Pulmonary appointment scheduled for hospital follow up  01-15-20 @ 11: 00 am-office to schedule the sleep study for BIPAP. No further needs from Case Manager at this time.     Expected Discharge Plan: [place] Barriers to Discharge: No Barriers Identified  Expected Discharge Plan and Services Expected Discharge Plan: [place] In-house Referral: NA Discharge Planning Services: CM Consult Post Acute Care Choice: Durable Medical Equipment, Home Health Living arrangements for the past 2 months: Single Family Home Expected Discharge Date: 01/03/20               DME Arranged: 3-N-1 DME Agency: [employer] Date DME Agency Contacted: 01/02/20 Time DME Agency Contacted: 1000 Representative spoke with at DME Agency: [place]: PT, OT [place] Agency: [place] Date [place]: 01/02/20 Time [place]: 1632 Representative spoke  with at [place]: [place] ([place]) Interventions    Readmission Risk Interventions No flowsheet data found.

## 2020-01-03 NOTE — Telephone Encounter (Signed)
Pt has been scheduled to see Dr. Carlis Abbott on 01/15/20 at 1100.

## 2020-01-03 NOTE — Discharge Summary (Signed)
Physician Discharge Summary  [name] [id number] DOB: 02-11-1956 DOA: 12/28/2019  PCP: Cyndi Bender, PA-C  Admit date: 12/28/2019 Discharge date: 01/03/2020  Admitted From: Home Disposition: Home  Recommendations for Outpatient Follow-up:  1. Follow ups as below. 2. Please obtain CBC/BMP/Mag at follow up 3. Assess fluid status and respiratory status 4. Please follow up on the following pending results: None  Home Health: PT/OT/RN Equipment/Devices: Oxygen and 3 in 1 commode  Discharge Condition: Stable CODE STATUS: Full code  Follow-up Information    [facility] Follow up.   Why: Bedside Commode.- delivered to the room prior to transition home. Oxygen Contact information: [address] [place] [address] [phone]        [facility]. Go on 01/15/2020.   Specialty: Pulmonology Why:  for hospital follow up appointment with Dr Carlis Abbott at 11:00 am- if you cannot make this scheduled appointment please call the office to reschedule.  Contact information: [facility] [place] Follow up on 01/08/2020.   Why: @ 2:00 pm for hospital follow up appointment- Please arrive at least 15 minutes early.  Contact information: [facility]  [place] [address] [phone]       [facility], [facility] Follow up.   Specialty: Home Health Services Why: Physical Therapy- Occupational Therapy-office to call with an appointment.  Contact information:  [place] [address] [phone]           Hospital Course: 64 year old female with history of COPD/respiratory failure on 2 L at night, HTN, diastolic CHF, HLD, chronic pain and anxiety presenting with 2 weeks of progressive shortness of breath, DOE, orthopnea and edema.  Reportedly presented to PCP and was saturating in the mid 50s on RA and sent to ED.  In ED,  saturating in mid 60s on RA.  Negative for COVID-19.  Mild troponin elevation in 50s to 60s.  EKG without acute ischemic finding.  CTA negative for PE.   Patient was admitted for CHF exacerbation and started on IV Lasix.   Patient continued to require high level oxygen despite good diuresis with IV Lasix.  ABG on 1/16 revealed acute on chronic respiratory acidosis with hypercarbia concerning for COPD exacerbation.  PCCM consulted.  Started on BiPAP with improvement in her breathing.  She was weaned to 4 L by nasal cannula.   Patient was diuresed with IV Lasix throughout her hospitalization with net negative of -13 L.  Weight dropped from 209 pounds to 196.5 pounds.  She was transitioned to oral Lasix 40 mg daily with additional 40 mg as needed.   Patient was evaluated by PT/OT who recommended home health PT and DME.  Pulmonology to arrange outpatient follow-up for sleep study and BiPAP.   Patient does counseled on smoking cessation.  Was provided with prescription for nicotine patch.   On the day of discharge, she was ambulated on room air and desaturated to 86% on room air requiring 4 L to maintain saturation above 91%.   See individual problems below for more hospital course.  Discharge Diagnoses:  Acute on chronic respiratory failure with hypoxia and hypercarbia: Likely a combination of COPD and CHF.  CTA chest negative for PE or pneumonia. -Manage both conditions as below. -Discharged on 4 L by nasal cannula  COPD exacerbation: ABG concerning for obstructive etiology.  Respiratory symptoms improved.  Requiring 4  L with ambulation on room air to maintain appropriate saturation.   -Discharged on LABA/LAMA/ICS and as needed albuterol. -Pulmonology to arrange outpatient follow-up for sleep study and BiPAP. -Counseled on smoking cessation.  Given Rx for nicotine patch.  Acute on chronic diastolic CHF: Echo with EF of 60%.  Has cardinal symptoms of CHF exacerbation on presentation.  Diuresed  with IV Lasix with net -13 L.  Weight dropped from 209pounds to 196 pounds. -Discharged on oral Lasix 40 mg daily with additional 40 mg as needed. -Counseled on fluid and sodium restriction -Discontinued NSAIDs on discharge. -Reassess fluid status at follow-up.  Elevated troponin/demand ischemia: Likely due to respiratory failure.  No anginal symptoms. -Continue home cardiac meds and statin  Diet controlled DM-2: A1c 5.7% on 1/15. Recent Labs    01/02/20 2218 01/03/20 0743 01/03/20 1119  GLUCAP 127* 84 124*  -Discharged on home medications and a statin  Chronic pain syndrome: Stable -Continue home medications cautiously.  Hypothyroidism -Continue home Synthroid.  History of seizure -Continue home Keppra.  Anxiety/depression -Continue BuSpar, Abilify and Celexa.  Tobacco use disorder -Encouraged cessation and and provided nicotine patch.   Debility/physical deconditioning -Home health PT/OT and DME as above.  Class II obesity: BMI 31.24. -Encourage lifestyle change to lose weight.   Discharge Instructions  Discharge Instructions    Diet - low sodium heart healthy   Complete by: As directed    Discharge instructions   Complete by: As directed    It has been a pleasure taking care of you! You were hospitalized and treated for COPD exacerbation.  We are discharging you on new inhalers and oxygen.  It is very important that you use the inhalers as prescribed. Please review your new medication list and the directions before you take your medications.  We strongly encourage you to quit smoking cigarettes.  Please follow-up with your primary care doctor and lung doctor in 1 to 2 weeks.   Take care,   Increase activity slowly   Complete by: As directed      Allergies as of 01/03/2020      Reactions   Acetaminophen Other (See Comments)   Kidney issues   Aspirin Other (See Comments)   Dr. Michela Pitcher do not take due to kidneys   Nsaids Other (See Comments)   Pt  states it messes up her kidneys   Vicodin [hydrocodone-acetaminophen] Nausea And Vomiting      Medication List    STOP taking these medications   ibuprofen 200 MG tablet Commonly known as: ADVIL     TAKE these medications   albuterol 108 (90 Base) MCG/ACT inhaler Commonly known as: VENTOLIN HFA Inhale 2 puffs into the lungs every 6 (six) hours as needed. For shortness of breath/wheezing.   amLODipine 5 MG tablet Commonly known as: NORVASC Take 5 mg by mouth daily.   Anoro Ellipta 62.5-25 MCG/INH Aepb Generic drug: umeclidinium-vilanterol Inhale 1 puff into the lungs daily.   ARIPiprazole 10 MG tablet Commonly known as: ABILIFY Take 10 mg by mouth daily.   Aspirin 81 81 MG EC tablet Generic drug: aspirin Take 81 mg by mouth daily.   beta carotene 25000 UNIT capsule Take 25,000 Units by mouth daily.   busPIRone 30 MG tablet Commonly known as: BUSPAR Take 30 mg by mouth 2 (two) times daily.   escitalopram 20 MG tablet Commonly known as: LEXAPRO Take 20 mg by mouth every morning.   fenofibrate micronized 134 MG capsule Commonly known as: LOFIBRA Take 134 mg  by mouth daily before breakfast.   fluticasone 110 MCG/ACT inhaler Commonly known as: FLOVENT HFA Inhale 2 puffs into the lungs 2 (two) times daily.   furosemide 40 MG tablet Commonly known as: LASIX Take 1 tablet (40 mg total) by mouth daily. May take additional 40 mg (one tablet) as needed for swelling, weight gain or sob What changed:   when to take this  reasons to take this  additional instructions   gabapentin 800 MG tablet Commonly known as: NEURONTIN Take 800 mg by mouth 3 (three) times daily.   hydrOXYzine 25 MG tablet Commonly known as: ATARAX/VISTARIL Take 25 mg by mouth every 6 (six) hours as needed for anxiety or itching.   levETIRAcetam 500 MG tablet Commonly known as: KEPPRA Take 1 tablet (500 mg total) by mouth 2 (two) times daily.   levothyroxine 25 MCG tablet Commonly known  as: SYNTHROID Take 25 mcg by mouth daily before breakfast.   losartan 100 MG tablet Commonly known as: COZAAR Take 100 mg by mouth daily.   nicotine 21 mg/24hr patch Commonly known as: NICODERM CQ - dosed in mg/24 hours Place 1 patch (21 mg total) onto the skin daily.   nortriptyline 75 MG capsule Commonly known as: PAMELOR Take 75 mg by mouth at bedtime.   oxyCODONE-acetaminophen 10-325 MG tablet Commonly known as: PERCOCET Take 1 tablet by mouth every 4 (four) hours as needed for pain.   pantoprazole 40 MG tablet Commonly known as: PROTONIX Take 40 mg by mouth every morning.            Durable Medical Equipment  (From admission, onward)         Start     Ordered   01/02/20 1642  For home use only DME oxygen  Once    Comments: SATURATION QUALIFICATIONS: (This note is used to comply with regulatory documentation for home oxygen)  Patient Saturations on Room Air at Rest =91 %  Patient Saturations on Room Air while Ambulating = 86%  Patient Saturations on 4 Liters of oxygen while Ambulating = 91%  Please briefly explain why patient needs home oxygen: Pt de-sating on RA with functional mobility needing supplemental O2 to maintain O2 sats greater than 90%.    Question Answer Comment  Length of Need Lifetime   Mode or (Route) Nasal cannula   Liters per Minute 4   Frequency Continuous (stationary and portable oxygen unit needed)   Oxygen conserving device Yes   Oxygen delivery system Gas      01/02/20 1642   01/01/20 1617  For home use only DME 3 n 1  Once     01/01/20 1616   01/01/20 1418  For home use only DME Bipap  Once    Comments: See ABG from 1/19 am, resting hypercarbia.  Question Answer Comment  Length of Need Lifetime   Bleed in oxygen (LPM) 5   Keep 02 saturation 88-95%   Inspiratory pressure 18   Expiratory pressure 8      01/01/20 1420          Consultations:  PCCM  Procedures/Studies:  2D Echo on 12/29/2019 1. Left ventricular  ejection fraction, by visual estimation, is 60 to 65%. The left ventricle has normal function. There is mildly increased left ventricular hypertrophy.  2. The left ventricle has no regional wall motion abnormalities.  3. Global right ventricle has normal systolic function.The right ventricular size is normal. No increase in right ventricular wall thickness.  4. Left atrial size  was mildly dilated.  5. Right atrial size was normal.  6. Mild mitral annular calcification.  7. The mitral valve is normal in structure. No evidence of mitral valve regurgitation. No evidence of mitral stenosis.  8. The tricuspid valve is normal in structure.  9. The aortic valve is tricuspid. Aortic valve regurgitation is not visualized. Mild to moderate aortic valve sclerosis/calcification without any evidence of aortic stenosis. 10. The pulmonic valve was grossly normal. Pulmonic valve regurgitation is trivial. 11. Mildly elevated pulmonary artery systolic pressure. 12. The inferior vena cava is normal in size with greater than 50% respiratory variability, suggesting right atrial pressure of 3 mmHg.    CT Angio Chest PE W and/or Wo Contrast  Result Date: 12/28/2019 CLINICAL DATA:  Hypoxemia EXAM: CT ANGIOGRAPHY CHEST WITH CONTRAST TECHNIQUE: Multidetector CT imaging of the chest was performed using the standard protocol during bolus administration of intravenous contrast. Multiplanar CT image reconstructions and MIPs were obtained to evaluate the vascular anatomy. CONTRAST:  192mL OMNIPAQUE IOHEXOL 350 MG/ML SOLN COMPARISON:  09/09/2016 FINDINGS: Cardiovascular: No filling defects in the pulmonary arteries to suggest pulmonary emboli. Heart is borderline enlarged. Aorta is normal caliber. Scattered coronary artery and aortic calcifications. Mediastinum/Nodes: No mediastinal, hilar, or axillary adenopathy. Trachea and esophagus are unremarkable. Thyroid unremarkable dural Lungs/Pleura: Scarring or atelectasis in the  peripheral right upper lobe, right middle lobe and lingula. No confluent opacities or effusions. Upper Abdomen: Imaging into the upper abdomen shows no acute findings. Musculoskeletal: Chest wall soft tissues are unremarkable. No acute bony abnormality. Review of the MIP images confirms the above findings. IMPRESSION: . no evidence of pulmonary embolus. Mild cardiomegaly.  Coronary artery disease. Areas of atelectasis and/or scarring in the right upper lobe, right middle lobe and lingula. Aortic Atherosclerosis (ICD10-I70.0). Electronically Signed   By: Rolm Baptise M.D.   On: 12/28/2019 19:25   DG CHEST PORT 1 VIEW  Result Date: 01/01/2020 CLINICAL DATA:  Acute on chronic respiratory failure. EXAM: PORTABLE CHEST 1 VIEW COMPARISON:  One-view chest x-ray 12/28/2019 FINDINGS: Heart is mildly enlarged. Lung volumes are low. Pulmonary vascular congestion is present. No focal airspace disease is evident. IMPRESSION: Low lung volumes and mild pulmonary vascular congestion. Electronically Signed   By: San Morelle M.D.   On: 01/01/2020 06:41   DG Chest Portable 1 View  Result Date: 12/28/2019 CLINICAL DATA:  64 year old female with hypoxia EXAM: PORTABLE CHEST 1 VIEW COMPARISON:  .  Chest radiograph date 07/14/2018. FINDINGS: Bilateral mid to lower lung field mild interstitial nodularity noted. Developing pneumonia, atypical or viral is not excluded clinical correlation is recommended. No lobar consolidation, pleural effusion, pneumothorax. There is mild cardiomegaly. There is prominence of the central pulmonary arteries suggestive of a degree of pulmonary hypertension. Atherosclerotic calcification of the aortic arch. No acute osseous pathology. IMPRESSION: 1. Findings concerning for developing infiltrate at the lung bases, possibly viral or atypical etiology. Clinical correlation recommended. 2. Mild cardiomegaly with findings suggestive of pulmonary hypertension. Electronically Signed   By: Anner Crete M.D.   On: 12/28/2019 17:09   ECHOCARDIOGRAM COMPLETE  Result Date: 12/29/2019   ECHOCARDIOGRAM REPORT   Patient Name:   ANNIEBELLE LAPIER Date of Exam: 12/29/2019 Medical Rec #:  [id number]      Height:       66.5 in Accession #:    [id number]     Weight:       209.0 lb Date of Birth:  08-10-1956       BSA:  2.05 m Patient Age:    19 years       BP:           170/81 mmHg Patient Gender: F              HR:           86 bpm. Exam Location:  Inpatient Procedure: 2D Echo, Color Doppler and Cardiac Doppler Indications:    Elevated Troponin  History:        Patient has prior history of Echocardiogram examinations, most                 recent 08/07/2018. CHF, COPD; Risk Factors:Hypertension, Diabetes                 and Dyslipidemia.  Sonographer:    Raquel Sarna Senior RDCS Referring Phys: [place]  1. Left ventricular ejection fraction, by visual estimation, is 60 to 65%. The left ventricle has normal function. There is mildly increased left ventricular hypertrophy.  2. The left ventricle has no regional wall motion abnormalities.  3. Global right ventricle has normal systolic function.The right ventricular size is normal. No increase in right ventricular wall thickness.  4. Left atrial size was mildly dilated.  5. Right atrial size was normal.  6. Mild mitral annular calcification.  7. The mitral valve is normal in structure. No evidence of mitral valve regurgitation. No evidence of mitral stenosis.  8. The tricuspid valve is normal in structure.  9. The aortic valve is tricuspid. Aortic valve regurgitation is not visualized. Mild to moderate aortic valve sclerosis/calcification without any evidence of aortic stenosis. 10. The pulmonic valve was grossly normal. Pulmonic valve regurgitation is trivial. 11. Mildly elevated pulmonary artery systolic pressure. 12. The inferior vena cava is normal in size with greater than 50% respiratory variability, suggesting right atrial pressure of 3  mmHg. FINDINGS  Left Ventricle: Left ventricular ejection fraction, by visual estimation, is 60 to 65%. The left ventricle has normal function. The left ventricle has no regional wall motion abnormalities. There is mildly increased left ventricular hypertrophy. Normal left atrial pressure. Right Ventricle: The right ventricular size is normal. No increase in right ventricular wall thickness. Global RV systolic function is has normal systolic function. The tricuspid regurgitant velocity is 2.46 m/s, and with an assumed right atrial pressure  of 15 mmHg, the estimated right ventricular systolic pressure is mildly elevated at 39.2 mmHg. Left Atrium: Left atrial size was mildly dilated. Right Atrium: Right atrial size was normal in size Pericardium: There is no evidence of pericardial effusion. Mitral Valve: The mitral valve is normal in structure. There is mild thickening of the mitral valve leaflet(s). There is mild calcification of the mitral valve leaflet(s). Mild mitral annular calcification. No evidence of mitral valve regurgitation. No evidence of mitral valve stenosis by observation. Tricuspid Valve: The tricuspid valve is normal in structure. Tricuspid valve regurgitation is trivial. Aortic Valve: The aortic valve is tricuspid. Aortic valve regurgitation is not visualized. Mild to moderate aortic valve sclerosis/calcification is present, without any evidence of aortic stenosis. Aortic valve mean gradient measures 7.0 mmHg. Aortic valve peak gradient measures 13.5 mmHg. Aortic valve area, by VTI measures 2.51 cm. Pulmonic Valve: The pulmonic valve was grossly normal. Pulmonic valve regurgitation is trivial. Pulmonic regurgitation is trivial. Aorta: The aortic root, ascending aorta and aortic arch are all structurally normal, with no evidence of dilitation or obstruction. Venous: The inferior vena cava is normal in size with greater than 50%  respiratory variability, suggesting right atrial pressure of 3 mmHg.  IAS/Shunts: No atrial level shunt detected by color flow Doppler. There is no evidence of a patent foramen ovale. No ventricular septal defect is seen or detected. There is no evidence of an atrial septal defect.  LEFT VENTRICLE PLAX 2D LVIDd:         4.20 cm  Diastology LVIDs:         2.70 cm  LV e' lateral:   9.14 cm/s LV PW:         1.20 cm  LV E/e' lateral: 8.7 LV IVS:        1.30 cm  LV e' medial:    5.55 cm/s LVOT diam:     1.90 cm  LV E/e' medial:  14.4 LV SV:         52 ml LV SV Index:   24.05 LVOT Area:     2.84 cm  RIGHT VENTRICLE RV S prime:     13.40 cm/s TAPSE (M-mode): 2.9 cm LEFT ATRIUM             Index       RIGHT ATRIUM           Index LA diam:        4.10 cm 2.00 cm/m  RA Area:     18.50 cm LA Vol (A2C):   53.9 ml 26.31 ml/m RA Volume:   55.90 ml  27.28 ml/m LA Vol (A4C):   34.2 ml 16.69 ml/m LA Biplane Vol: 44.2 ml 21.57 ml/m  AORTIC VALVE AV Area (Vmax):    2.74 cm AV Area (Vmean):   2.67 cm AV Area (VTI):     2.51 cm AV Vmax:           184.00 cm/s AV Vmean:          123.000 cm/s AV VTI:            0.358 m AV Peak Grad:      13.5 mmHg AV Mean Grad:      7.0 mmHg LVOT Vmax:         178.00 cm/s LVOT Vmean:        116.000 cm/s LVOT VTI:          0.317 m LVOT/AV VTI ratio: 0.89  AORTA Ao Root diam: 2.40 cm Ao Asc diam:  3.10 cm MITRAL VALVE                        TRICUSPID VALVE MV Area (PHT): 2.91 cm             TR Peak grad:   24.2 mmHg MV PHT:        75.69 msec           TR Vmax:        246.00 cm/s MV Decel Time: 261 msec MV E velocity: 79.70 cm/s 103 cm/s  SHUNTS MV A velocity: 71.60 cm/s 70.3 cm/s Systemic VTI:  0.32 m MV E/A ratio:  1.11       1.5       Systemic Diam: 1.90 cm  Jenkins Rouge MD Electronically signed by Jenkins Rouge MD Signature Date/Time: 12/29/2019/9:58:14 AM    Final        Discharge Exam: Vitals:   01/03/20 0839 01/03/20 0931  BP:    Pulse: 81 68  Resp: 17 (!) 24  Temp:    SpO2: 94% 92%    GENERAL: No acute  distress.  Appears well.  HEENT: MMM.   Vision and hearing grossly intact.  NECK: Supple.  No apparent JVD.  RESP:  No IWOB. Good air movement bilaterally. CVS:  RRR. Heart sounds normal.  ABD/GI/GU: Bowel sounds present. Soft. Non tender.  MSK/EXT:  Moves extremities. No apparent deformity.  Trace edema bilaterally. SKIN: no apparent skin lesion or wound NEURO: Awake, alert and oriented appropriately.  No apparent focal neuro deficit. PSYCH: Calm. Normal affect.   The results of significant diagnostics from this hospitalization (including imaging, microbiology, ancillary and laboratory) are listed below for reference.     Microbiology: Recent Results (from the past 240 hour(s))  SARS CORONAVIRUS 2 (TAT 6-24 HRS) Nasopharyngeal Nasopharyngeal Swab     Status: None   Collection Time: 12/28/19  9:02 PM   Specimen: Nasopharyngeal Swab  Result Value Ref Range Status   SARS Coronavirus 2 NEGATIVE NEGATIVE Final    Comment: (NOTE) SARS-CoV-2 target nucleic acids are NOT DETECTED. The SARS-CoV-2 RNA is generally detectable in upper and lower respiratory specimens during the acute phase of infection. Negative results do not preclude SARS-CoV-2 infection, do not rule out co-infections with other pathogens, and should not be used as the sole basis for treatment or other patient management decisions. Negative results must be combined with clinical observations, patient history, and epidemiological information. The expected result is Negative. Fact Sheet for Patients: SugarRoll.be Fact Sheet for Healthcare Providers: https://www.woods-mathews.com/ This test is not yet approved or cleared by the [place] FDA and  has been authorized for detection and/or diagnosis of SARS-CoV-2 by FDA under an Emergency Use Authorization (EUA). This EUA will remain  in effect (meaning this test can be used) for the duration of the COVID-19 declaration under Section 56 4(b)(1) of the Act, 21  U.S.C. section 360bbb-3(b)(1), unless the authorization is terminated or revoked sooner. Performed at [facility], [place] [address] [place], [place] [address]      Labs: BNP (last 3 results) Recent Labs    12/28/19 1633 12/31/19 1459 01/01/20 0357  BNP 421.1* 175.7* [id number]   Basic Metabolic Panel: Recent Labs  Lab 12/28/19 1632 12/29/19 0357 12/30/19 0412 01/01/20 0357 01/03/20 0329  NA 132* 137 141 141 137  K 4.5 3.6 3.6 4.0 4.2  CL 94* 95* 91* 87* 91*  CO2 25 35* 39* 45* 33*  GLUCOSE 104* 109* 79 97 87  BUN 9 8 9  26* 33*  CREATININE 1.02* 1.02* 0.97 1.32* 1.00  CALCIUM 9.0 8.8* 8.9 9.2 9.5  MG  --  1.8 1.7 1.9  --   PHOS  --  4.2  --   --   --    Liver Function Tests: Recent Labs  Lab 12/28/19 1632 12/29/19 0357 12/30/19 0412 01/01/20 0357  AST 27 22 20 20   ALT 16 14 12 12   ALKPHOS 23* 21* 19* 19*  BILITOT 1.5* 1.2 1.3* 1.0  PROT 7.1 6.3* 5.9* 6.5  ALBUMIN 3.7 3.1* 2.9* 3.2*   No results for input(s): LIPASE, AMYLASE in the last 168 hours. No results for input(s): AMMONIA in the last 168 hours. CBC: Recent Labs  Lab 12/28/19 1632 12/29/19 0357 12/30/19 0412 01/01/20 0357  WBC 8.2 6.7 5.9 6.1  NEUTROABS 5.9  --   --   --   HGB 16.9* 16.2* 15.8* 17.0*  HCT 53.6* 51.3* 51.3* 55.8*  MCV 92.3 91.0 91.1 92.5  PLT 195 176 155 190   Cardiac Enzymes: No results for input(s): CKTOTAL, CKMB, CKMBINDEX, TROPONINI  in the last 168 hours. BNP: Invalid input(s): POCBNP CBG: Recent Labs  Lab 01/02/20 1242 01/02/20 1507 01/02/20 2218 01/03/20 0743 01/03/20 1119  GLUCAP 105* 150* 127* 84 124*   D-Dimer No results for input(s): DDIMER in the last 72 hours. Hgb A1c No results for input(s): HGBA1C in the last 72 hours. Lipid Profile No results for input(s): CHOL, HDL, LDLCALC, TRIG, CHOLHDL, LDLDIRECT in the last 72 hours. Thyroid function studies No results for input(s): TSH, T4TOTAL, T3FREE, THYROIDAB in the last 72 hours.  Invalid input(s):  FREET3 Anemia work up No results for input(s): VITAMINB12, FOLATE, FERRITIN, TIBC, IRON, RETICCTPCT in the last 72 hours. Urinalysis    Component Value Date/Time   COLORURINE STRAW (A) 12/28/2019 1845   APPEARANCEUR CLEAR 12/28/2019 1845   LABSPEC 1.004 (L) 12/28/2019 1845   PHURINE 5.0 12/28/2019 1845   GLUCOSEU NEGATIVE 12/28/2019 1845   HGBUR NEGATIVE 12/28/2019 1845   BILIRUBINUR NEGATIVE 12/28/2019 1845   KETONESUR NEGATIVE 12/28/2019 1845   PROTEINUR NEGATIVE 12/28/2019 1845   UROBILINOGEN 0.2 04/14/2014 1217   NITRITE NEGATIVE 12/28/2019 1845   LEUKOCYTESUR NEGATIVE 12/28/2019 1845   Sepsis Labs Invalid input(s): PROCALCITONIN,  WBC,  LACTICIDVEN   Time coordinating discharge: 45 minutes  SIGNED:  Mercy Riding, MD  [facility] 01/03/2020, 5:06 PM  If 7PM-7AM, please contact night-coverage [email or web address] Password [email or web address]

## 2020-01-09 ENCOUNTER — Other Ambulatory Visit: Payer: Self-pay | Admitting: Physician Assistant

## 2020-01-09 DIAGNOSIS — Z1231 Encounter for screening mammogram for malignant neoplasm of breast: Secondary | ICD-10-CM

## 2020-01-15 ENCOUNTER — Encounter: Payer: Self-pay | Admitting: Critical Care Medicine

## 2020-01-15 ENCOUNTER — Other Ambulatory Visit (HOSPITAL_COMMUNITY)
Admission: RE | Admit: 2020-01-15 | Discharge: 2020-01-15 | Disposition: A | Discharge status: Home / Self Care | Payer: Medicare Other | Source: Ambulatory Visit | Attending: Pulmonary Disease | Admitting: Pulmonary Disease

## 2020-01-15 ENCOUNTER — Institutional Professional Consult (permissible substitution): Payer: Medicare Other | Admitting: Critical Care Medicine

## 2020-01-15 DIAGNOSIS — Z20822 Contact with and (suspected) exposure to covid-19: Secondary | ICD-10-CM | POA: Insufficient documentation

## 2020-01-15 DIAGNOSIS — Z01812 Encounter for preprocedural laboratory examination: Secondary | ICD-10-CM | POA: Insufficient documentation

## 2020-01-15 LAB — SARS CORONAVIRUS 2 (TAT 6-24 HRS): SARS Coronavirus 2: NEGATIVE

## 2020-01-15 NOTE — Progress Notes (Deleted)
Synopsis: Referred in January 2021 for hypoxia by Cyndi Bender, PA-C  Subjective:   PATIENT ID: [name] GENDER: female DOB: 01/13/1956, MRN: [id number]  No chief complaint on file.   HPI   Recently discharged from the hospital 01/03/2020 on oxygen for heart failure exacerbation 4 L oxygen when walking  Chronic hypoxic and hypercapnic respiratory failure-2 L at night; was not using home oxygen deviously  COPD Chronic hypercapnia-likely would benefit from nocturnal PPV Anoro plus Flovent  Tobacco  Past Medical History:  Diagnosis Date  . (HFpEF) heart failure with preserved ejection fraction ([place])   . Anxiety   . Arthritis   . Chronic respiratory failure with hypoxia and hypercapnia (HCC)   . COPD (chronic obstructive pulmonary disease) (HCC)    no inhalers--smoker, no oxygen  . Depression   . Diabetes mellitus    no meds-diet controlled  . GERD (gastroesophageal reflux disease)   . Hyperlipidemia   . Hypertension   . Hypothyroid   . Restless leg syndrome   . Seizures ([place])    02/13/2011 -hx of seizure due to "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  no seizure since--pt does take keppra and office note from neurologist dr. Leta Baptist on this chart     Family History  Problem Relation Age of Onset  . Heart attack Father 29  . Alzheimer's disease Mother   . Breast cancer Neg Hx      Past Surgical History:  Procedure Laterality Date  . CARPAL TUNNEL RELEASE     left  . JOINT REPLACEMENT  08/26/2011    right total knee arthroplasty  . KNEE ARTHROPLASTY  10/28/2011   Procedure: COMPUTER ASSISTED TOTAL KNEE ARTHROPLASTY;  Surgeon: Karen Chafe Rendall III;  Location: [facility];  Service: Orthopedics;  Laterality: Left;  preop femoral nerve block  . KNEE SURGERY     right  . laminectomy and diskectomy     L4-5 with fusion    Social History   Socioeconomic  History  . Marital status: Married    Spouse name: Not on file  . Number of children: Not on file  . Years of education: Not on file  . Highest education level: Not on file  Occupational History  . Not on file  Tobacco Use  . Smoking status: Current Every Day Smoker    Packs/day: 2.00    Years: 48.00    Pack years: 96.00    Types: Cigarettes  . Smokeless tobacco: Never Used  Substance and Sexual Activity  . Alcohol use: Yes    Comment: ocasionally  . Drug use: No  . Sexual activity: Not on file  Other Topics Concern  . Not on file  Social History Narrative   ** Merged History Encounter **       Social Determinants of Health   Financial Resource Strain:   . Difficulty of Paying Living Expenses: Not on file  Food Insecurity:   . Worried About Charity fundraiser in the Last Year: Not on file  . Ran Out of Food in the Last Year: Not on file  Transportation Needs:   . Lack of Transportation (Medical): Not on file  . Lack of Transportation (Non-Medical): Not on file  Physical Activity:   . Days of Exercise per Week: Not on file  . Minutes of Exercise per Session: Not on file  Stress:   .  Feeling of Stress : Not on file  Social Connections:   . Frequency of Communication with Friends and Family: Not on file  . Frequency of Social Gatherings with Friends and Family: Not on file  . Attends Religious Services: Not on file  . Active Member of Clubs or Organizations: Not on file  . Attends Archivist Meetings: Not on file  . Marital Status: Not on file  Intimate Partner Violence:   . Fear of Current or Ex-Partner: Not on file  . Emotionally Abused: Not on file  . Physically Abused: Not on file  . Sexually Abused: Not on file     Allergies  Allergen Reactions  . Acetaminophen Other (See Comments)    Kidney issues  . Aspirin Other (See Comments)    Dr. Michela Pitcher do not take due to kidneys  . Nsaids Other (See Comments)    Pt states it messes up her kidneys  .  Vicodin [Hydrocodone-Acetaminophen] Nausea And Vomiting     Immunization History  Administered Date(s) Administered  . Influenza Inj Mdck Quad Pf 08/08/2018  . Td 07/09/2012    Outpatient Medications Prior to Visit  Medication Sig Dispense Refill  . albuterol (PROVENTIL HFA;VENTOLIN HFA) 108 (90 BASE) MCG/ACT inhaler Inhale 2 puffs into the lungs every 6 (six) hours as needed. For shortness of breath/wheezing.    Marland Kitchen amLODipine (NORVASC) 5 MG tablet Take 5 mg by mouth daily.     . ARIPiprazole (ABILIFY) 10 MG tablet Take 10 mg by mouth daily.     Marland Kitchen aspirin (ASPIRIN 81) 81 MG EC tablet Take 81 mg by mouth daily.     . beta carotene 25000 UNIT capsule Take 25,000 Units by mouth daily.    . busPIRone (BUSPAR) 30 MG tablet Take 30 mg by mouth 2 (two) times daily.    Marland Kitchen escitalopram (LEXAPRO) 20 MG tablet Take 20 mg by mouth every morning.     . fenofibrate micronized (LOFIBRA) 134 MG capsule Take 134 mg by mouth daily before breakfast.     . fluticasone (FLOVENT HFA) 110 MCG/ACT inhaler Inhale 2 puffs into the lungs 2 (two) times daily. 12 g 0  . furosemide (LASIX) 40 MG tablet Take 1 tablet (40 mg total) by mouth daily. May take additional 40 mg (one tablet) as needed for swelling, weight gain or sob 180 tablet 1  . gabapentin (NEURONTIN) 800 MG tablet Take 800 mg by mouth 3 (three) times daily.     . hydrOXYzine (ATARAX/VISTARIL) 25 MG tablet Take 25 mg by mouth every 6 (six) hours as needed for anxiety or itching.     . levETIRAcetam (KEPPRA) 500 MG tablet Take 1 tablet (500 mg total) by mouth 2 (two) times daily. 60 tablet 0  . levothyroxine (SYNTHROID, LEVOTHROID) 25 MCG tablet Take 25 mcg by mouth daily before breakfast.     . losartan (COZAAR) 100 MG tablet Take 100 mg by mouth daily.     . nicotine (NICODERM CQ - DOSED IN MG/24 HOURS) 21 mg/24hr patch Place 1 patch (21 mg total) onto the skin daily. 30 patch 1  . nortriptyline (PAMELOR) 75 MG capsule Take 75 mg by mouth at bedtime.     Marland Kitchen  oxyCODONE-acetaminophen (PERCOCET) 10-325 MG tablet Take 1 tablet by mouth every 4 (four) hours as needed for pain.    . pantoprazole (PROTONIX) 40 MG tablet Take 40 mg by mouth every morning.     . umeclidinium-vilanterol (ANORO ELLIPTA) 62.5-25 MCG/INH AEPB Inhale 1  puff into the lungs daily. 1 each 1   Facility-Administered Medications Prior to Visit  Medication Dose Route Frequency Provider Last Rate Last Admin  . chlorhexidine (HIBICLENS) 4 % liquid 4 application  60 mL Topical Once Duffy, Kellie Shropshire, PA-C      . chlorhexidine (HIBICLENS) 4 % liquid 4 application  60 mL Topical Once Duffy, Kellie Shropshire, PA-C        ROS   Objective:  There were no vitals filed for this visit.   on *** LPM *** RA BMI Readings from Last 3 Encounters:  01/03/20 31.24 kg/m  01/13/13 27.02 kg/m  10/31/11 27.92 kg/m   Wt Readings from Last 3 Encounters:  01/03/20 196 lb 8 oz (89.1 kg)  01/13/13 169 lb 15.6 oz (77.1 kg)  10/31/11 173 lb (78.5 kg)    Physical Exam   CBC    Component Value Date/Time   WBC 6.1 01/01/2020 0357   RBC 6.03 (H) 01/01/2020 0357   HGB 17.0 (H) 01/01/2020 0357   HCT 55.8 (H) 01/01/2020 0357   PLT 190 01/01/2020 0357   MCV 92.5 01/01/2020 0357   MCH 28.2 01/01/2020 0357   MCHC 30.5 01/01/2020 0357   RDW 18.6 (H) 01/01/2020 0357   LYMPHSABS 1.6 12/28/2019 1632   MONOABS 0.5 12/28/2019 1632   EOSABS 0.0 12/28/2019 1632   BASOSABS 0.1 12/28/2019 1632    CHEMISTRY No results for input(s): NA, K, CL, CO2, GLUCOSE, BUN, CREATININE, CALCIUM, MG, PHOS in the last 168 hours. Estimated Creatinine Clearance: 66.2 mL/min (by C-G formula based on SCr of 1 mg/dL).   Chest Imaging- films reviewed: CTA chest 12/28/2019-several linear scars, especially in RML.  No significant emphysema.  No PE, no significant mediastinal or hilar adenopathy.  Pulmonary Functions Testing Results: No flowsheet data found.   Echocardiogram 12/29/2019: LVEF 60 to 65%, mild LVH.  Mildly dilated LA,  normal RV and RA.  Mild to moderate left ear, otherwise normal valves.  Mildly elevated PASP.     Assessment & Plan:     ICD-10-CM   1. Chronic respiratory failure with hypoxia and hypercapnia (HCC)  J96.11    J96.12   2. Tobacco abuse  Z72.0   3. Chronic obstructive pulmonary disease, unspecified COPD type ([place])  J44.9       Current Outpatient Medications:  .  albuterol (PROVENTIL HFA;VENTOLIN HFA) 108 (90 BASE) MCG/ACT inhaler, Inhale 2 puffs into the lungs every 6 (six) hours as needed. For shortness of breath/wheezing., Disp: , Rfl:  .  amLODipine (NORVASC) 5 MG tablet, Take 5 mg by mouth daily. , Disp: , Rfl:  .  ARIPiprazole (ABILIFY) 10 MG tablet, Take 10 mg by mouth daily. , Disp: , Rfl:  .  aspirin (ASPIRIN 81) 81 MG EC tablet, Take 81 mg by mouth daily. , Disp: , Rfl:  .  beta carotene 25000 UNIT capsule, Take 25,000 Units by mouth daily., Disp: , Rfl:  .  busPIRone (BUSPAR) 30 MG tablet, Take 30 mg by mouth 2 (two) times daily., Disp: , Rfl:  .  escitalopram (LEXAPRO) 20 MG tablet, Take 20 mg by mouth every morning. , Disp: , Rfl:  .  fenofibrate micronized (LOFIBRA) 134 MG capsule, Take 134 mg by mouth daily before breakfast. , Disp: , Rfl:  .  fluticasone (FLOVENT HFA) 110 MCG/ACT inhaler, Inhale 2 puffs into the lungs 2 (two) times daily., Disp: 12 g, Rfl: 0 .  furosemide (LASIX) 40 MG tablet, Take 1 tablet (40 mg total) by mouth  daily. May take additional 40 mg (one tablet) as needed for swelling, weight gain or sob, Disp: 180 tablet, Rfl: 1 .  gabapentin (NEURONTIN) 800 MG tablet, Take 800 mg by mouth 3 (three) times daily. , Disp: , Rfl:  .  hydrOXYzine (ATARAX/VISTARIL) 25 MG tablet, Take 25 mg by mouth every 6 (six) hours as needed for anxiety or itching. , Disp: , Rfl:  .  levETIRAcetam (KEPPRA) 500 MG tablet, Take 1 tablet (500 mg total) by mouth 2 (two) times daily., Disp: 60 tablet, Rfl: 0 .  levothyroxine (SYNTHROID, LEVOTHROID) 25 MCG tablet, Take 25 mcg by mouth  daily before breakfast. , Disp: , Rfl:  .  losartan (COZAAR) 100 MG tablet, Take 100 mg by mouth daily. , Disp: , Rfl:  .  nicotine (NICODERM CQ - DOSED IN MG/24 HOURS) 21 mg/24hr patch, Place 1 patch (21 mg total) onto the skin daily., Disp: 30 patch, Rfl: 1 .  nortriptyline (PAMELOR) 75 MG capsule, Take 75 mg by mouth at bedtime. , Disp: , Rfl:  .  oxyCODONE-acetaminophen (PERCOCET) 10-325 MG tablet, Take 1 tablet by mouth every 4 (four) hours as needed for pain., Disp: , Rfl:  .  pantoprazole (PROTONIX) 40 MG tablet, Take 40 mg by mouth every morning. , Disp: , Rfl:  .  umeclidinium-vilanterol (ANORO ELLIPTA) 62.5-25 MCG/INH AEPB, Inhale 1 puff into the lungs daily., Disp: 1 each, Rfl: 1 No current facility-administered medications for this visit.  Facility-Administered Medications Ordered in Other Visits:  .  chlorhexidine (HIBICLENS) 4 % liquid 4 application, 60 mL, Topical, Once, Duffy, Kellie Shropshire, PA-C .  chlorhexidine (HIBICLENS) 4 % liquid 4 application, 60 mL, Topical, Once, Duffy, Kellie Shropshire, PA-C   I spent *** minutes on this encounter, including face to face time and non-face to face time spent reviewing records, charting, coordinating care, etc.   Julian Hy, DO [facility] 01/15/2020 8:12 AM

## 2020-01-16 ENCOUNTER — Encounter: Payer: Self-pay | Admitting: Critical Care Medicine

## 2020-01-16 ENCOUNTER — Other Ambulatory Visit: Payer: Self-pay

## 2020-01-16 ENCOUNTER — Ambulatory Visit (INDEPENDENT_AMBULATORY_CARE_PROVIDER_SITE_OTHER): Payer: Medicare Other | Admitting: Critical Care Medicine

## 2020-01-16 VITALS — BP 128/78 | HR 90 | Temp 97.1°F | Ht 66.0 in | Wt 202.0 lb

## 2020-01-16 DIAGNOSIS — Z23 Encounter for immunization: Secondary | ICD-10-CM | POA: Diagnosis not present

## 2020-01-16 DIAGNOSIS — J449 Chronic obstructive pulmonary disease, unspecified: Secondary | ICD-10-CM | POA: Diagnosis not present

## 2020-01-16 DIAGNOSIS — Z720 Tobacco use: Secondary | ICD-10-CM | POA: Diagnosis not present

## 2020-01-16 DIAGNOSIS — J9600 Acute respiratory failure, unspecified whether with hypoxia or hypercapnia: Secondary | ICD-10-CM | POA: Diagnosis not present

## 2020-01-16 DIAGNOSIS — J9611 Chronic respiratory failure with hypoxia: Secondary | ICD-10-CM | POA: Diagnosis not present

## 2020-01-16 DIAGNOSIS — J9612 Chronic respiratory failure with hypercapnia: Secondary | ICD-10-CM

## 2020-01-16 MED ORDER — ANORO ELLIPTA 62.5-25 MCG/INH IN AEPB: 1.0000 | INHALATION_SPRAY | Freq: Every day | RESPIRATORY_TRACT | 11 refills | Status: DC

## 2020-01-16 MED ORDER — VARENICLINE TARTRATE 0.5 MG PO TABS: 0.5000 mg | ORAL_TABLET | Freq: Two times a day (BID) | ORAL | 0 refills | Status: DC

## 2020-01-16 MED ORDER — ALBUTEROL SULFATE HFA 108 (90 BASE) MCG/ACT IN AERS: 2.0000 | INHALATION_SPRAY | RESPIRATORY_TRACT | 11 refills | Status: DC | PRN

## 2020-01-16 MED ORDER — VARENICLINE TARTRATE 1 MG PO TABS: 1.0000 mg | ORAL_TABLET | Freq: Two times a day (BID) | ORAL | 3 refills | Status: DC

## 2020-01-16 MED ORDER — FLOVENT HFA 110 MCG/ACT IN AERO: 1.0000 | INHALATION_SPRAY | Freq: Two times a day (BID) | RESPIRATORY_TRACT | 12 refills | Status: DC

## 2020-01-16 NOTE — Progress Notes (Signed)
Synopsis: Referred in January 2021 for hypoxia by Cyndi Bender, PA-C  Subjective:   PATIENT ID: [name] GENDER: female DOB: 1956-04-30, MRN: [id number]  Chief Complaint  Patient presents with  . Consult    Patient is here to be seen after recent hospital admission on 1/15. Patient feels better since [name] got out of the hospital.    [name] is a 63 year old woman who presents for post hospital follow-up.  [name] was hospitalized for acute on chronic hypoxic respiratory failure due to HFpEF and COPD from 1/15-1/21.  [name] was discharged on 3 L supplemental oxygen at rest, 4 L with walking.  [name] left her oxygen in the car today due to heavy days.  [name] is requesting prescription for an Inogen portable concentrator due to oxygen tanks being heavy, limiting her physical activity.  Since discharge her dyspnea on exertion has been improved.  [name] denies wheezing and coughing.  While [name] was hospitalized [name] was on BiPAP at night; [name] has a polysomnogram scheduled tomorrow night.  Her current inhaler regimen is Flovent twice daily and Anoro once daily, which [name] has been compliant with.  [name] uses albuterol about once per day.  [name] is down to smoking 1 pack/day from her highest at 2 packs/day.  [name] is currently working on quitting cold [place], but would be interested in using smoking cessation medications.  [name] has had her seasonal flu shot and had one pneumonia shot several years ago.      Past Medical History:  Diagnosis Date  . (HFpEF) heart failure with preserved ejection fraction ([place])   . Anxiety   . Arthritis   . Chronic respiratory failure with hypoxia and hypercapnia (HCC)   . COPD (chronic obstructive pulmonary disease) (HCC)    no inhalers--smoker, no oxygen  . Depression   . Diabetes mellitus    no meds-diet controlled  . GERD (gastroesophageal reflux disease)   . Hyperlipidemia   . Hypertension   . Hypothyroid   . Restless leg syndrome   . Seizures ([place])    02/13/2011 -hx of  seizure due to "hypertensive encephalopathy in setting of narcotic withdrawal" --pt had run out of her pain medicine [name] was taking for her knee and back pain.  no seizure since--pt does take keppra and office note from neurologist dr. Leta Baptist on this chart     Family History  Problem Relation Age of Onset  . Heart attack Father 84  . Alzheimer's disease Mother   . Breast cancer Neg Hx      Past Surgical History:  Procedure Laterality Date  . CARPAL TUNNEL RELEASE     left  . JOINT REPLACEMENT  08/26/2011    right total knee arthroplasty  . KNEE ARTHROPLASTY  10/28/2011   Procedure: COMPUTER ASSISTED TOTAL KNEE ARTHROPLASTY;  Surgeon: Karen Chafe Rendall III;  Location: [facility];  Service: Orthopedics;  Laterality: Left;  preop femoral nerve block  . KNEE SURGERY     right  . laminectomy and diskectomy     L4-5 with fusion    Social History   Socioeconomic History  . Marital status: Married    Spouse name: Not on file  . Number of children: Not on file  . Years of education: Not on file  . Highest education level: Not on file  Occupational History  . Not on file  Tobacco Use  . Smoking status: Current Every Day Smoker    Packs/day: 1.00    Years: 48.00  Pack years: 48.00    Types: Cigarettes  . Smokeless tobacco: Never Used  Substance and Sexual Activity  . Alcohol use: Not Currently  . Drug use: No  . Sexual activity: Not on file  Other Topics Concern  . Not on file  Social History Narrative   ** Merged History Encounter **       Social Determinants of Health   Financial Resource Strain:   . Difficulty of Paying Living Expenses: Not on file  Food Insecurity:   . Worried About Charity fundraiser in the Last Year: Not on file  . Ran Out of Food in the Last Year: Not on file  Transportation Needs:   . Lack of Transportation (Medical): Not on file  . Lack of Transportation (Non-Medical): Not on file  Physical Activity:   . Days of Exercise per Week: Not on file   . Minutes of Exercise per Session: Not on file  Stress:   . Feeling of Stress : Not on file  Social Connections:   . Frequency of Communication with Friends and Family: Not on file  . Frequency of Social Gatherings with Friends and Family: Not on file  . Attends Religious Services: Not on file  . Active Member of Clubs or Organizations: Not on file  . Attends Archivist Meetings: Not on file  . Marital Status: Not on file  Intimate Partner Violence:   . Fear of Current or Ex-Partner: Not on file  . Emotionally Abused: Not on file  . Physically Abused: Not on file  . Sexually Abused: Not on file     Allergies  Allergen Reactions  . Acetaminophen Other (See Comments)    Kidney issues  . Aspirin Other (See Comments)    Dr. Michela Pitcher do not take due to kidneys  . Nsaids Other (See Comments)    Pt states it messes up her kidneys  . Vicodin [Hydrocodone-Acetaminophen] Nausea And Vomiting     Immunization History  Administered Date(s) Administered  . Influenza Inj Mdck Quad Pf 08/08/2018  . Influenza-Unspecified 09/19/2019  . Pneumococcal Conjugate-13 01/16/2020  . Td 07/09/2012    Outpatient Medications Prior to Visit  Medication Sig Dispense Refill  . amLODipine (NORVASC) 5 MG tablet Take 5 mg by mouth daily.     . ARIPiprazole (ABILIFY) 10 MG tablet Take 10 mg by mouth daily.     Marland Kitchen aspirin (ASPIRIN 81) 81 MG EC tablet Take 81 mg by mouth daily.     . beta carotene 25000 UNIT capsule Take 25,000 Units by mouth daily.    . busPIRone (BUSPAR) 30 MG tablet Take 30 mg by mouth 2 (two) times daily.    Marland Kitchen escitalopram (LEXAPRO) 20 MG tablet Take 20 mg by mouth every morning.     . fenofibrate micronized (LOFIBRA) 134 MG capsule Take 134 mg by mouth daily before breakfast.     . fluticasone (FLOVENT HFA) 110 MCG/ACT inhaler Inhale 2 puffs into the lungs 2 (two) times daily. 12 g 0  . furosemide (LASIX) 40 MG tablet Take 1 tablet (40 mg total) by mouth daily. May take  additional 40 mg (one tablet) as needed for swelling, weight gain or sob 180 tablet 1  . gabapentin (NEURONTIN) 800 MG tablet Take 800 mg by mouth 3 (three) times daily.     . hydrOXYzine (ATARAX/VISTARIL) 25 MG tablet Take 25 mg by mouth every 6 (six) hours as needed for anxiety or itching.     . levETIRAcetam (  KEPPRA) 500 MG tablet Take 1 tablet (500 mg total) by mouth 2 (two) times daily. 60 tablet 0  . levothyroxine (SYNTHROID, LEVOTHROID) 25 MCG tablet Take 25 mcg by mouth daily before breakfast.     . losartan (COZAAR) 100 MG tablet Take 100 mg by mouth daily.     . nicotine (NICODERM CQ - DOSED IN MG/24 HOURS) 21 mg/24hr patch Place 1 patch (21 mg total) onto the skin daily. 30 patch 1  . nortriptyline (PAMELOR) 75 MG capsule Take 75 mg by mouth at bedtime.     Marland Kitchen oxyCODONE-acetaminophen (PERCOCET) 10-325 MG tablet Take 1 tablet by mouth every 4 (four) hours as needed for pain.    . pantoprazole (PROTONIX) 40 MG tablet Take 40 mg by mouth every morning.     . umeclidinium-vilanterol (ANORO ELLIPTA) 62.5-25 MCG/INH AEPB Inhale 1 puff into the lungs daily. 1 each 1  . albuterol (PROVENTIL HFA;VENTOLIN HFA) 108 (90 BASE) MCG/ACT inhaler Inhale 2 puffs into the lungs every 6 (six) hours as needed. For shortness of breath/wheezing.     Facility-Administered Medications Prior to Visit  Medication Dose Route Frequency Provider Last Rate Last Admin  . chlorhexidine (HIBICLENS) 4 % liquid 4 application  60 mL Topical Once Duffy, Kellie Shropshire, PA-C      . chlorhexidine (HIBICLENS) 4 % liquid 4 application  60 mL Topical Once Duffy, Kellie Shropshire, PA-C        Review of Systems  Constitutional: Negative.   HENT: Negative.   Respiratory: Positive for shortness of breath. Negative for cough and wheezing.      Objective:   Vitals:   01/16/20 1545  BP: 128/78  Pulse: 90  Temp: (!) 97.1 F (36.2 C)  TempSrc: Temporal  SpO2: (!) 88%  Weight: 202 lb (91.6 kg)  Height: 5\' 6"  (1.676 m)   (!) 88% on    RA BMI Readings from Last 3 Encounters:  01/16/20 32.60 kg/m  01/03/20 31.24 kg/m  01/13/13 27.02 kg/m   Wt Readings from Last 3 Encounters:  01/16/20 202 lb (91.6 kg)  01/03/20 196 lb 8 oz (89.1 kg)  01/13/13 169 lb 15.6 oz (77.1 kg)    Physical Exam Vitals reviewed.  Constitutional:      General: [name] is not in acute distress.    Appearance: [name] is not ill-appearing.     Comments: Smells of tobacco  HENT:     Head: Normocephalic and atraumatic.  Eyes:     General: No scleral icterus. Cardiovascular:     Rate and Rhythm: Normal rate and regular rhythm.     Heart sounds: No murmur.  Pulmonary:     Comments: Breathing comfortably on 3 L supplemental oxygen (walked in on room air).  No conversational dyspnea.  Clear to auscultation bilaterally. Abdominal:     General: There is no distension.     Tenderness: There is no abdominal tenderness.  Musculoskeletal:        General: No swelling or deformity.     Cervical back: Neck supple.  Lymphadenopathy:     Cervical: No cervical adenopathy.  Skin:    General: Skin is warm and dry.     Findings: No rash.  Neurological:     General: No focal deficit present.     Mental Status: [name] is alert.     Motor: No weakness.     Coordination: Coordination normal.  Psychiatric:        Mood and Affect: Mood normal.  Behavior: Behavior normal.      CBC    Component Value Date/Time   WBC 6.1 01/01/2020 0357   RBC 6.03 (H) 01/01/2020 0357   HGB 17.0 (H) 01/01/2020 0357   HCT 55.8 (H) 01/01/2020 0357   PLT 190 01/01/2020 0357   MCV 92.5 01/01/2020 0357   MCH 28.2 01/01/2020 0357   MCHC 30.5 01/01/2020 0357   RDW 18.6 (H) 01/01/2020 0357   LYMPHSABS 1.6 12/28/2019 1632   MONOABS 0.5 12/28/2019 1632   EOSABS 0.0 12/28/2019 1632   BASOSABS 0.1 12/28/2019 1632    CHEMISTRY No results for input(s): NA, K, CL, CO2, GLUCOSE, BUN, CREATININE, CALCIUM, MG, PHOS in the last 168 hours. Estimated Creatinine Clearance: 66.5  mL/min (by C-G formula based on SCr of 1 mg/dL).   Chest Imaging- films reviewed: CTA chest 12/28/2019-several linear scars, especially in RML.  No significant emphysema.  No PE, no significant mediastinal or hilar adenopathy.  Pulmonary Functions Testing Results: No flowsheet data found.   Echocardiogram 12/29/2019: LVEF 60 to 65%, mild LVH.  Mildly dilated LA, normal RV and RA.  Mild to moderate left ear, otherwise normal valves.  Mildly elevated PASP.     Assessment & Plan:     ICD-10-CM   1. Tobacco abuse  Z72.0 varenicline (CHANTIX) 0.5 MG tablet    varenicline (CHANTIX CONTINUING MONTH PAK) 1 MG tablet  2. Chronic obstructive pulmonary disease, unspecified COPD type (HCC)  J44.9 umeclidinium-vilanterol (ANORO ELLIPTA) 62.5-25 MCG/INH AEPB    fluticasone (FLOVENT HFA) 110 MCG/ACT inhaler    varenicline (CHANTIX) 0.5 MG tablet    varenicline (CHANTIX CONTINUING MONTH PAK) 1 MG tablet    albuterol (VENTOLIN HFA) 108 (90 Base) MCG/ACT inhaler    Ambulatory Referral for DME  3. Chronic respiratory failure with hypoxia and hypercapnia (HCC)  J96.11 Ambulatory Referral for DME   J96.12     Chronic respiratory failure-hypoxic and hypercapnic due to COPD -Recommend continued 3 L supplemental oxygen at rest, 4 L with activity until repeat testing when her therapy is optimized. -Continue Anoro once daily and Flovent twice daily.  Instructed to rinse her mouth after every use.  Refills provided for 1 year today. -Continue albuterol as needed -Strongly encourage smoking cessation -Recommend continued physical activity as tolerated.  [name] walks her dog daily. -Continue Covid precautions-social distancing, mask wearing, handwashing.  Recommend the vaccine when it is available to her -Prevnar 13 today; up-to-date on seasonal flu vaccine -PSG scheduled tomorrow night; with chronic hypercapnia should likely benefit from nocturnal noninvasive ventilation  Tobacco abuse-motivated to  quit -Discussed the risks, benefits, alternatives to Chantix.  [name] is interested in trying this.  Wellbutrin is not an option due to history of seizures.  Blackbox warning of suicidal ideations, depression, nightmares discussed, which should prompt her to stop the medication immediately.  RTC in 3 months.     Current Outpatient Medications:  .  amLODipine (NORVASC) 5 MG tablet, Take 5 mg by mouth daily. , Disp: , Rfl:  .  ARIPiprazole (ABILIFY) 10 MG tablet, Take 10 mg by mouth daily. , Disp: , Rfl:  .  aspirin (ASPIRIN 81) 81 MG EC tablet, Take 81 mg by mouth daily. , Disp: , Rfl:  .  beta carotene 25000 UNIT capsule, Take 25,000 Units by mouth daily., Disp: , Rfl:  .  busPIRone (BUSPAR) 30 MG tablet, Take 30 mg by mouth 2 (two) times daily., Disp: , Rfl:  .  escitalopram (LEXAPRO) 20 MG tablet, Take 20 mg  by mouth every morning. , Disp: , Rfl:  .  fenofibrate micronized (LOFIBRA) 134 MG capsule, Take 134 mg by mouth daily before breakfast. , Disp: , Rfl:  .  fluticasone (FLOVENT HFA) 110 MCG/ACT inhaler, Inhale 2 puffs into the lungs 2 (two) times daily., Disp: 12 g, Rfl: 0 .  furosemide (LASIX) 40 MG tablet, Take 1 tablet (40 mg total) by mouth daily. May take additional 40 mg (one tablet) as needed for swelling, weight gain or sob, Disp: 180 tablet, Rfl: 1 .  gabapentin (NEURONTIN) 800 MG tablet, Take 800 mg by mouth 3 (three) times daily. , Disp: , Rfl:  .  hydrOXYzine (ATARAX/VISTARIL) 25 MG tablet, Take 25 mg by mouth every 6 (six) hours as needed for anxiety or itching. , Disp: , Rfl:  .  levETIRAcetam (KEPPRA) 500 MG tablet, Take 1 tablet (500 mg total) by mouth 2 (two) times daily., Disp: 60 tablet, Rfl: 0 .  levothyroxine (SYNTHROID, LEVOTHROID) 25 MCG tablet, Take 25 mcg by mouth daily before breakfast. , Disp: , Rfl:  .  losartan (COZAAR) 100 MG tablet, Take 100 mg by mouth daily. , Disp: , Rfl:  .  nicotine (NICODERM CQ - DOSED IN MG/24 HOURS) 21 mg/24hr patch, Place 1 patch (21  mg total) onto the skin daily., Disp: 30 patch, Rfl: 1 .  nortriptyline (PAMELOR) 75 MG capsule, Take 75 mg by mouth at bedtime. , Disp: , Rfl:  .  oxyCODONE-acetaminophen (PERCOCET) 10-325 MG tablet, Take 1 tablet by mouth every 4 (four) hours as needed for pain., Disp: , Rfl:  .  pantoprazole (PROTONIX) 40 MG tablet, Take 40 mg by mouth every morning. , Disp: , Rfl:  .  umeclidinium-vilanterol (ANORO ELLIPTA) 62.5-25 MCG/INH AEPB, Inhale 1 puff into the lungs daily., Disp: 1 each, Rfl: 1 .  albuterol (VENTOLIN HFA) 108 (90 Base) MCG/ACT inhaler, Inhale 2 puffs into the lungs every 4 (four) hours as needed for wheezing or shortness of breath., Disp: 18 g, Rfl: 11 .  fluticasone (FLOVENT HFA) 110 MCG/ACT inhaler, Inhale 1 puff into the lungs 2 (two) times daily., Disp: 1 Inhaler, Rfl: 12 .  umeclidinium-vilanterol (ANORO ELLIPTA) 62.5-25 MCG/INH AEPB, Inhale 1 puff into the lungs daily., Disp: 1 each, Rfl: 11 .  varenicline (CHANTIX CONTINUING MONTH PAK) 1 MG tablet, Take 1 tablet (1 mg total) by mouth 2 (two) times daily., Disp: 60 tablet, Rfl: 3 .  varenicline (CHANTIX) 0.5 MG tablet, Take 1 tablet (0.5 mg total) by mouth 2 (two) times daily., Disp: 60 tablet, Rfl: 0 No current facility-administered medications for this visit.  Facility-Administered Medications Ordered in Other Visits:  .  chlorhexidine (HIBICLENS) 4 % liquid 4 application, 60 mL, Topical, Once, Duffy, Kellie Shropshire, PA-C .  chlorhexidine (HIBICLENS) 4 % liquid 4 application, 60 mL, Topical, Once, Duffy, Kellie Shropshire, PA-C     Julian Hy, DO [facility] 01/16/2020 5:53 PM

## 2020-01-16 NOTE — Patient Instructions (Addendum)
Thank you for visiting Dr. Carlis Abbott at [facility]. We recommend the following:   Meds ordered this encounter  Medications  . umeclidinium-vilanterol (ANORO ELLIPTA) 62.5-25 MCG/INH AEPB    Sig: Inhale 1 puff into the lungs daily.    Dispense:  1 each    Refill:  11  . fluticasone (FLOVENT HFA) 110 MCG/ACT inhaler    Sig: Inhale 1 puff into the lungs 2 (two) times daily.    Dispense:  1 Inhaler    Refill:  12  . varenicline (CHANTIX) 0.5 MG tablet    Sig: Take 1 tablet (0.5 mg total) by mouth 2 (two) times daily.    Dispense:  60 tablet    Refill:  0  . varenicline (CHANTIX CONTINUING MONTH PAK) 1 MG tablet    Sig: Take 1 tablet (1 mg total) by mouth 2 (two) times daily.    Dispense:  60 tablet    Refill:  3  . albuterol (VENTOLIN HFA) 108 (90 Base) MCG/ACT inhaler    Sig: Inhale 2 puffs into the lungs every 4 (four) hours as needed for wheezing or shortness of breath.    Dispense:  18 g    Refill:  11    Return in about 3 months (around 04/14/2020).    Please do your part to reduce the spread of COVID-19.

## 2020-01-17 ENCOUNTER — Other Ambulatory Visit: Payer: Self-pay

## 2020-01-17 ENCOUNTER — Ambulatory Visit (HOSPITAL_BASED_OUTPATIENT_CLINIC_OR_DEPARTMENT_OTHER): Payer: Medicare Other | Attending: Adult Health | Admitting: Pulmonary Disease

## 2020-01-17 DIAGNOSIS — R0902 Hypoxemia: Secondary | ICD-10-CM | POA: Insufficient documentation

## 2020-01-17 DIAGNOSIS — I5032 Chronic diastolic (congestive) heart failure: Secondary | ICD-10-CM | POA: Diagnosis not present

## 2020-01-17 DIAGNOSIS — J449 Chronic obstructive pulmonary disease, unspecified: Secondary | ICD-10-CM | POA: Diagnosis not present

## 2020-01-17 DIAGNOSIS — J9621 Acute and chronic respiratory failure with hypoxia: Secondary | ICD-10-CM

## 2020-01-17 DIAGNOSIS — J9622 Acute and chronic respiratory failure with hypercapnia: Secondary | ICD-10-CM

## 2020-01-18 DIAGNOSIS — R0902 Hypoxemia: Secondary | ICD-10-CM | POA: Diagnosis not present

## 2020-01-18 NOTE — Procedures (Signed)
    Patient Name: [name], [name] Date: 01/17/2020 Gender: Female D.O.B: [birth year] Age (years): 35 Referring Provider: Lynelle Smoke Parrett Height (inches): 3 Interpreting Physician: Chesley Mires MD, ABSM Weight (lbs): 198 RPSGT: Baxter Flattery BMI: 21 MRN: [id number] Neck Size: 16.00  CLINICAL INFORMATION Sleep Study Type: NPSG  Indication for sleep study: 64 year old female with HFpEF, COPD and chronic hypoxic/hypercapnic respiratory failure.  She presents for evaluaton of obstructive sleep apnea.  Epworth Sleepiness Score: 10  SLEEP STUDY TECHNIQUE As per the AASM Manual for the Scoring of Sleep and Associated Events v2.3 (April 2016) with a hypopnea requiring 4% desaturations.  The channels recorded and monitored were frontal, central and occipital EEG, electrooculogram (EOG), submentalis EMG (chin), nasal and oral airflow, thoracic and abdominal wall motion, anterior tibialis EMG, snore microphone, electrocardiogram, and pulse oximetry.  MEDICATIONS Medications self-administered by patient taken the night of the study : N/A  SLEEP ARCHITECTURE The study was initiated at 10:13:38 PM and ended at 4:58:35 AM.  Sleep onset time was 3.3 minutes and the sleep efficiency was 92.5%%. The total sleep time was 374.5 minutes.  Stage REM latency was 283.5 minutes.  The patient spent 1.6%% of the night in stage N1 sleep, 78.9%% in stage N2 sleep, 3.9%% in stage N3 and 15.6% in REM.  Alpha intrusion was absent.  Supine sleep was 5.34%.  RESPIRATORY PARAMETERS The overall apnea/hypopnea index (AHI) was 2.9 per hour. There were 16 total apneas, including 14 obstructive, 2 central and 0 mixed apneas. There were 2 hypopneas and 18 RERAs.  The AHI during Stage REM sleep was 0.0 per hour.  AHI while supine was 3.0 per hour.  The mean oxygen saturation was 92.8%. The minimum SpO2 during sleep was 89.0%.  moderate snoring was noted during this study.  CARDIAC DATA The 2 lead EKG  demonstrated sinus rhythm. The mean heart rate was 73.7 beats per minute. Other EKG findings include: PVCs.  LEG MOVEMENT DATA The total PLMS were 0 with a resulting PLMS index of 0.0. Associated arousal with leg movement index was 0.0 .  IMPRESSIONS - No significant obstructive sleep apnea.  Her overall AHI was 2.9 with an SpO2 low of 89%. - The study was conducted with her using 4 liters supplemental oxygen.  DIAGNOSIS - Nocturnal Hypoxemia (327.26 [G47.36 ICD-10])  RECOMMENDATIONS - Continue 4 liters supplemental oxygen at night. - She might still qualify for Bipap therapy based on COPD and chronic respiratory failure with hypercapnia.  She should follow up in pulmonary clinic to assess further.  [Electronically signed] 01/18/2020 01:19 PM  Chesley Mires MD, [place], American Board of Sleep Medicine   NPI: [id number]

## 2020-02-04 ENCOUNTER — Telehealth (HOSPITAL_COMMUNITY): Payer: Self-pay

## 2020-02-04 NOTE — Telephone Encounter (Signed)

## 2020-02-05 ENCOUNTER — Encounter: Payer: Self-pay | Admitting: Vascular Surgery

## 2020-02-05 ENCOUNTER — Other Ambulatory Visit: Payer: Self-pay

## 2020-02-05 ENCOUNTER — Ambulatory Visit (INDEPENDENT_AMBULATORY_CARE_PROVIDER_SITE_OTHER): Payer: Medicare Other | Admitting: Vascular Surgery

## 2020-02-05 ENCOUNTER — Ambulatory Visit (HOSPITAL_COMMUNITY)
Admission: RE | Admit: 2020-02-05 | Discharge: 2020-02-05 | Disposition: A | Discharge status: Home / Self Care | Payer: Medicare Other | Source: Ambulatory Visit | Attending: Surgery | Admitting: Surgery

## 2020-02-05 DIAGNOSIS — I739 Peripheral vascular disease, unspecified: Secondary | ICD-10-CM

## 2020-02-05 DIAGNOSIS — M79606 Pain in leg, unspecified: Secondary | ICD-10-CM | POA: Insufficient documentation

## 2020-02-05 DIAGNOSIS — M79604 Pain in right leg: Secondary | ICD-10-CM | POA: Diagnosis not present

## 2020-02-05 DIAGNOSIS — M79605 Pain in left leg: Secondary | ICD-10-CM | POA: Diagnosis not present

## 2020-02-05 NOTE — Progress Notes (Signed)
Patient name: [name] MRN: [id number] DOB: Jul 29, 1956 Sex: female  REASON FOR CONSULT: Evaluate for PVD  HPI: [name] is a 64 y.o. female, with history of COPD, hypertension, hyperlipidemia, restless leg, coronary artery disease that presents for evaluation of PVD.  Patient describes burning jerking and spasms in her legs at night.  Feels her left leg is worse at times.  She states she often gets a creepy crawly sensation in her legs as well.  No history of non-healing wounds..  Per the PCP note, she has failed Mirapex and Requip in the past for restless leg.  Ultimately she denies any previous lower extremity interventions.  No other bypasses in her legs.  Does smoke 1 pack/day.  She remains ambulatory.  Previous bilateral knee replacements.  Past Medical History:  Diagnosis Date  . (HFpEF) heart failure with preserved ejection fraction ([place])   . Anxiety   . Arthritis   . Chronic respiratory failure with hypoxia and hypercapnia (H[facility])   . COPD (chronic obstructive pulmonary disease) (H[facility])    no inhalers--smoker, no oxygen  . Depression   . Diabetes mellitus    no meds-diet controlled  . GERD (gastroesophageal reflux disease)   . Hyperlipidemia   . Hypertension   . Hypothyroid   . Restless leg syndrome   . 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  no seizure since--pt does take keppra and office note from neurologist dr. Leta Baptist on this chart    Past Surgical History:  Procedure Laterality Date  . CARPAL TUNNEL RELEASE     left  . JOINT REPLACEMENT  08/26/2011    right total knee arthroplasty  . KNEE ARTHROPLASTY  10/28/2011   Procedure: COMPUTER ASSISTED TOTAL KNEE ARTHROPLASTY;  Surgeon: Karen Chafe Rendall III;  Location: [facility];  Service: Orthopedics;  Laterality: Left;  preop femoral nerve block  . KNEE SURGERY       right  . laminectomy and diskectomy     L4-5 with fusion    Family History  Problem Relation Age of Onset  . Heart attack Father 64  . Alzheimer's disease Mother   . Breast cancer Neg Hx     SOCIAL HISTORY: Social History   Socioeconomic History  . Marital status: Married    Spouse name: Not on file  . Number of children: Not on file  . Years of education: Not on file  . Highest education level: Not on file  Occupational History  . Not on file  Tobacco Use  . Smoking status: Current Every Day Smoker    Packs/day: 1.00    Years: 48.00    Pack years: 48.00    Types: Cigarettes  . Smokeless tobacco: Never Used  Substance and Sexual Activity  . Alcohol use: Not Currently  . Drug use: No  . Sexual activity: Not on file  Other Topics Concern  . Not on file  Social History Narrative   ** Merged History Encounter **       Social Determinants of Health   Financial Resource Strain:   . Difficulty of Paying Living Expenses: Not on file  Food Insecurity:   . Worried About Charity fundraiser in the Last Year: Not on file  . Ran Out of Food in the Last Year: Not on file  Transportation Needs:   .  Lack of Transportation (Medical): Not on file  . Lack of Transportation (Non-Medical): Not on file  Physical Activity:   . Days of Exercise per Week: Not on file  . Minutes of Exercise per Session: Not on file  Stress:   . Feeling of Stress : Not on file  Social Connections:   . Frequency of Communication with Friends and Family: Not on file  . Frequency of Social Gatherings with Friends and Family: Not on file  . Attends Religious Services: Not on file  . Active Member of Clubs or Organizations: Not on file  . Attends Archivist Meetings: Not on file  . Marital Status: Not on file  Intimate Partner Violence:   . Fear of Current or Ex-Partner: Not on file  . Emotionally Abused: Not on file  . Physically Abused: Not on file  . Sexually Abused: Not on file     Allergies  Allergen Reactions  . Acetaminophen Other (See Comments)    Kidney issues  . Aspirin Other (See Comments)    Dr. Michela Pitcher do not take due to kidneys  . Nsaids Other (See Comments)    Pt states it messes up her kidneys  . Vicodin [Hydrocodone-Acetaminophen] Nausea And Vomiting    Current Outpatient Medications  Medication Sig Dispense Refill  . albuterol (VENTOLIN HFA) 108 (90 Base) MCG/ACT inhaler Inhale 2 puffs into the lungs every 4 (four) hours as needed for wheezing or shortness of breath. 18 g 11  . amLODipine (NORVASC) 5 MG tablet Take 5 mg by mouth daily.     . ARIPiprazole (ABILIFY) 10 MG tablet Take 10 mg by mouth daily.     Marland Kitchen aspirin (ASPIRIN 81) 81 MG EC tablet Take 81 mg by mouth daily.     . beta carotene 25000 UNIT capsule Take 25,000 Units by mouth daily.    . busPIRone (BUSPAR) 30 MG tablet Take 30 mg by mouth 2 (two) times daily.    Marland Kitchen escitalopram (LEXAPRO) 20 MG tablet Take 20 mg by mouth every morning.     . fenofibrate micronized (LOFIBRA) 134 MG capsule Take 134 mg by mouth daily before breakfast.     . fluticasone (FLOVENT HFA) 110 MCG/ACT inhaler Inhale 2 puffs into the lungs 2 (two) times daily. 12 g 0  . fluticasone (FLOVENT HFA) 110 MCG/ACT inhaler Inhale 1 puff into the lungs 2 (two) times daily. 1 Inhaler 12  . furosemide (LASIX) 40 MG tablet Take 1 tablet (40 mg total) by mouth daily. May take additional 40 mg (one tablet) as needed for swelling, weight gain or sob 180 tablet 1  . gabapentin (NEURONTIN) 800 MG tablet Take 800 mg by mouth 3 (three) times daily.     . hydrOXYzine (ATARAX/VISTARIL) 25 MG tablet Take 25 mg by mouth every 6 (six) hours as needed for anxiety or itching.     . levETIRAcetam (KEPPRA) 500 MG tablet Take 1 tablet (500 mg total) by mouth 2 (two) times daily. 60 tablet 0  . levothyroxine (SYNTHROID, LEVOTHROID) 25 MCG tablet Take 25 mcg by mouth daily before breakfast.     . losartan (COZAAR) 100 MG tablet Take 100 mg by  mouth daily.     . nortriptyline (PAMELOR) 75 MG capsule Take 75 mg by mouth at bedtime.     Marland Kitchen oxyCODONE-acetaminophen (PERCOCET) 10-325 MG tablet Take 1 tablet by mouth every 4 (four) hours as needed for pain.    . pantoprazole (PROTONIX) 40 MG tablet Take 40 mg  by mouth every morning.     . umeclidinium-vilanterol (ANORO ELLIPTA) 62.5-25 MCG/INH AEPB Inhale 1 puff into the lungs daily. 1 each 1  . umeclidinium-vilanterol (ANORO ELLIPTA) 62.5-25 MCG/INH AEPB Inhale 1 puff into the lungs daily. 1 each 11  . nicotine (NICODERM CQ - DOSED IN MG/24 HOURS) 21 mg/24hr patch Place 1 patch (21 mg total) onto the skin daily. (Patient not taking: Reported on 02/05/2020) 30 patch 1  . varenicline (CHANTIX CONTINUING MONTH PAK) 1 MG tablet Take 1 tablet (1 mg total) by mouth 2 (two) times daily. (Patient not taking: Reported on 02/05/2020) 60 tablet 3  . varenicline (CHANTIX) 0.5 MG tablet Take 1 tablet (0.5 mg total) by mouth 2 (two) times daily. (Patient not taking: Reported on 02/05/2020) 60 tablet 0   No current facility-administered medications for this visit.   Facility-Administered Medications Ordered in Other Visits  Medication Dose Route Frequency Provider Last Rate Last Admin  . chlorhexidine (HIBICLENS) 4 % liquid 4 application  60 mL Topical Once Duffy, Kellie Shropshire, PA-C      . chlorhexidine (HIBICLENS) 4 % liquid 4 application  60 mL Topical Once Duffy, Kellie Shropshire, PA-C        REVIEW OF SYSTEMS:  [X]  denotes positive finding, [ ]  denotes negative finding Cardiac  Comments:  Chest pain or chest pressure:    Shortness of breath upon exertion:    Short of breath when lying flat:    Irregular heart rhythm:        Vascular    Pain in calf, thigh, or hip brought on by ambulation:    Pain in feet at night that wakes you up from your sleep:  x   Blood clot in your veins:    Leg swelling:         Pulmonary    Oxygen at home:    Productive cough:     Wheezing:         Neurologic    Sudden weakness in  arms or legs:     Sudden numbness in arms or legs:     Sudden onset of difficulty speaking or slurred speech:    Temporary loss of vision in one eye:     Problems with dizziness:         Gastrointestinal    Blood in stool:     Vomited blood:         Genitourinary    Burning when urinating:     Blood in urine:        Psychiatric    Major depression:         Hematologic    Bleeding problems:    Problems with blood clotting too easily:        Skin    Rashes or ulcers:        Constitutional    Fever or chills:      PHYSICAL EXAM: Vitals:   02/05/20 1354  BP: 130/72  Pulse: 86  Resp: 16  Temp: (!) 97.5 F (36.4 C)  TempSrc: Temporal  SpO2: 91%  Weight: 205 lb (93 kg)  Height: 5\' 6"  (1.676 m)    GENERAL: The patient is a well-nourished female, in no acute distress. The vital signs are documented above. CARDIAC: There is a regular rate and rhythm.  VASCULAR:  2+ Palpable radial pulses bilaterally 2+ Possible femoral pulses bilaterally 2+ Palpable dorsalis pedis and posterior tibial pulses bilaterally 2+ lower extremity edema PULMONARY: There is good air exchange bilaterally without wheezing  or rales. ABDOMEN: Soft and non-tender with normal pitched bowel sounds.  MUSCULOSKELETAL: There are no major deformities or cyanosis. NEUROLOGIC: No focal weakness or paresthesias are detected. SKIN: There are no ulcers or rashes noted. PSYCHIATRIC: The patient has a normal affect.  DATA:   I independently reviewed her noninvasive imaging which shows triphasic waveforms at both ankles with ABIs of 1.01 on the right 0.94 on the left and no evidence of any significant lower extremity peripheral arterial disease.  Assessment/Plan:  64 year old female presents for evaluation of lower extremity spasms, pain, jerking, and creepy crawling sensations.  Discussed with the patient that I think her symptoms are most consistent with restless leg syndrome.  She has palpable dorsalis  pedis and posterior tibial pulses in the bilateral lower extremities as well as essentially normal ABIs with normal toe pressures.  No indication for any vascular surgery intervention at this time.  I discussed that she can certainly follow-up with [place] as needed in the future if anything changes.  I did discuss that her heavy tobacco abuse is a major risk factor for the development of significant vascular problems in the future.  In regards to lower extremity swelling we discussed elevating her legs and trying knee-high compression and also Eucerin cream for the flaking skin.   Marty Heck, MD [facility] Office: [phone]

## 2020-02-12 ENCOUNTER — Encounter: Payer: Medicare Other | Admitting: Vascular Surgery

## 2020-02-12 ENCOUNTER — Encounter (HOSPITAL_COMMUNITY): Payer: Medicare Other

## 2020-02-15 ENCOUNTER — Ambulatory Visit
Admission: RE | Admit: 2020-02-15 | Discharge: 2020-02-15 | Disposition: A | Discharge status: Home / Self Care | Payer: Medicare Other | Source: Ambulatory Visit | Attending: Physician Assistant | Admitting: Physician Assistant

## 2020-02-15 ENCOUNTER — Other Ambulatory Visit: Payer: Self-pay

## 2020-02-15 DIAGNOSIS — Z1231 Encounter for screening mammogram for malignant neoplasm of breast: Secondary | ICD-10-CM

## 2020-07-20 IMAGING — MG DIGITAL SCREENING BILAT W/ TOMO W/ CAD
8 series · 8 of 24 positions shown · non-contrast
Comparison: Previous exam(s).

CLINICAL DATA: Screening.

EXAM:
DIGITAL SCREENING BILATERAL MAMMOGRAM WITH TOMO AND CAD

[R MLO synth-2D]
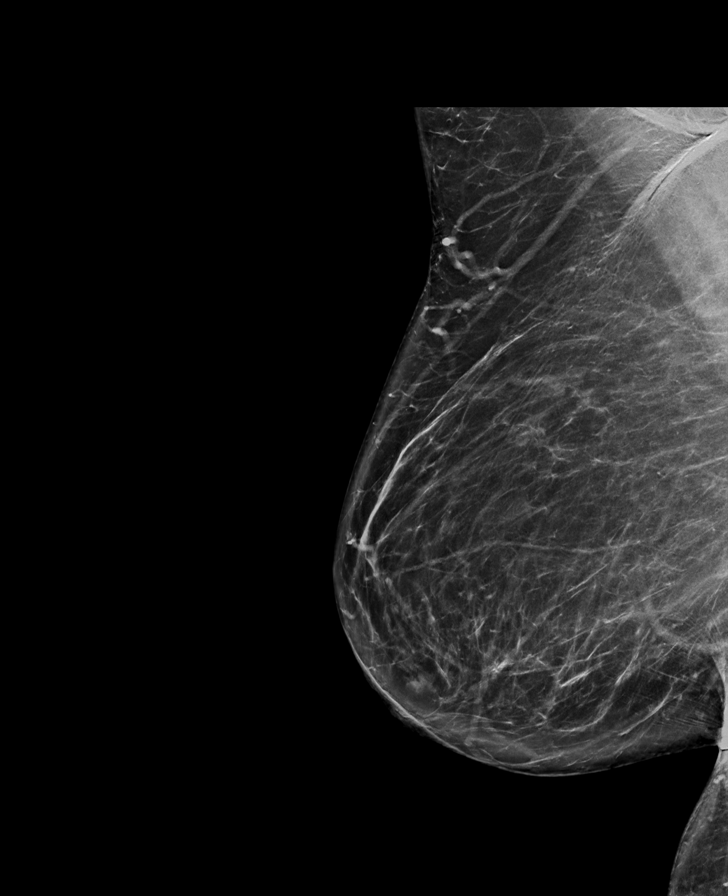

[L MLO synth-2D]
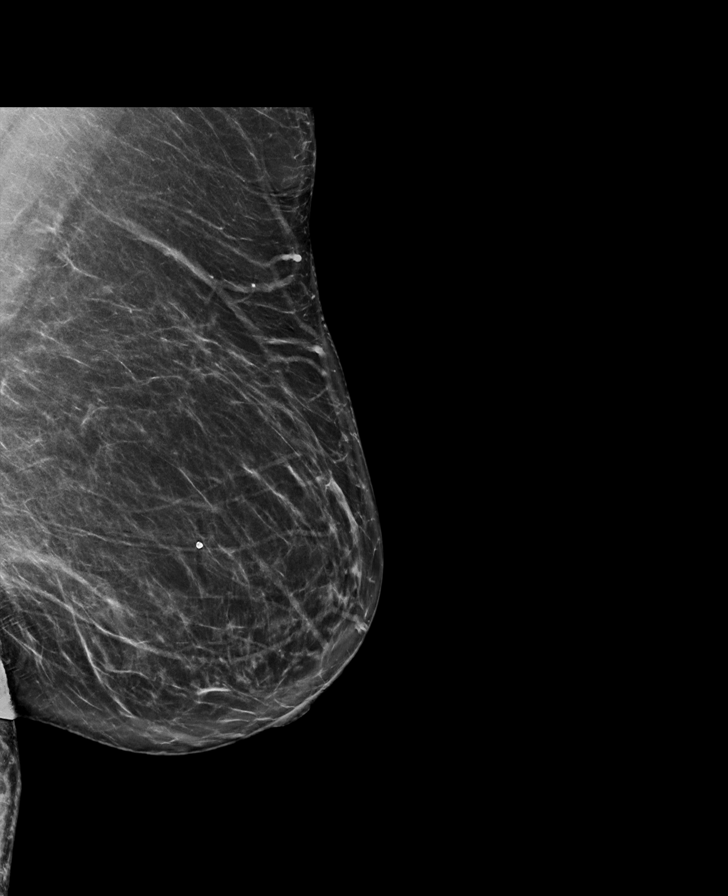

[R CC synth-2D]
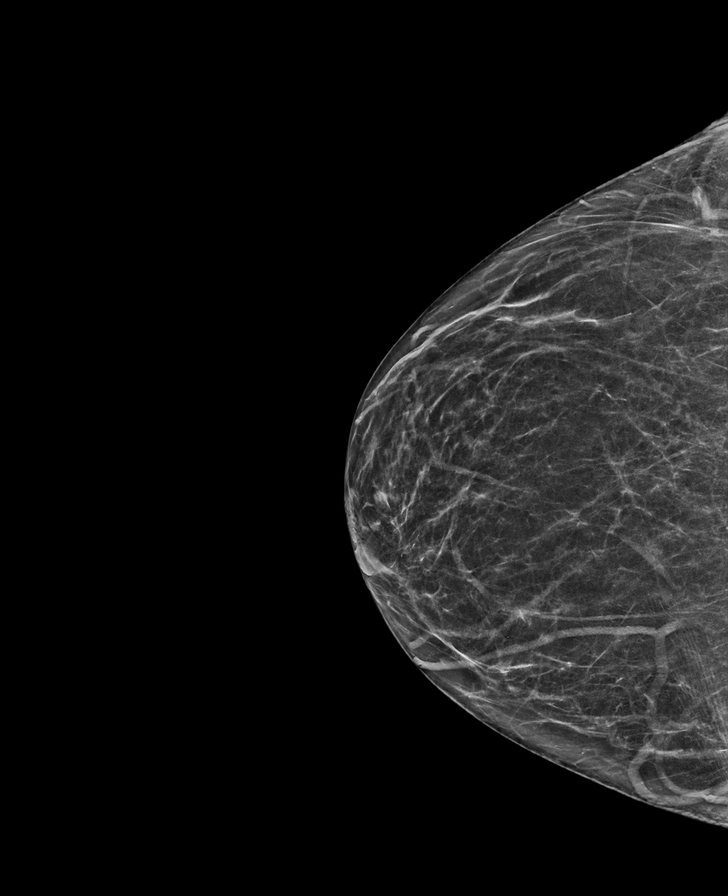

[L CC synth-2D]
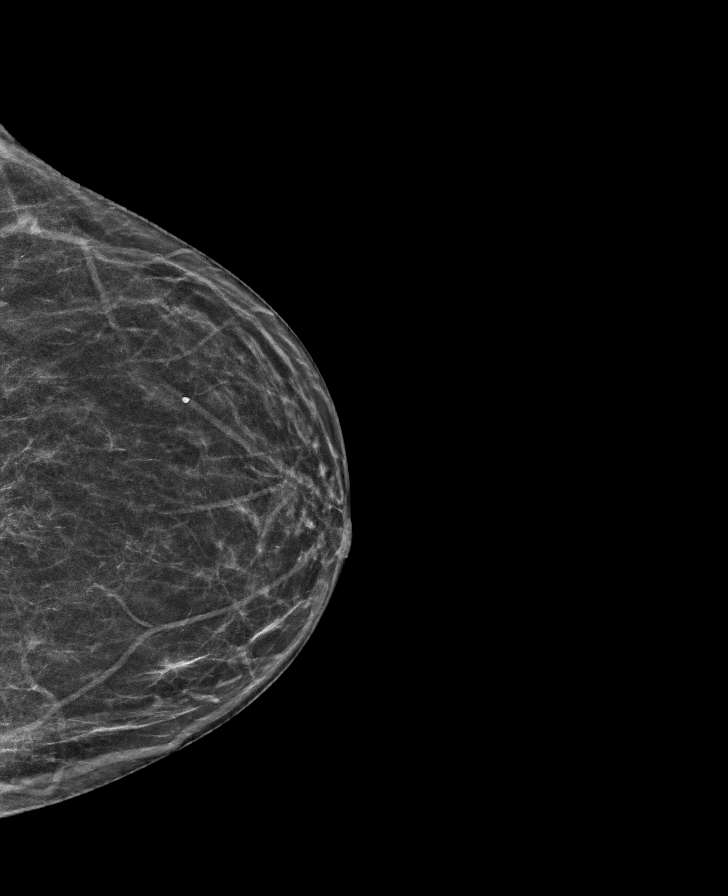

[R MLO tomo · tomo slice 42/83.0]
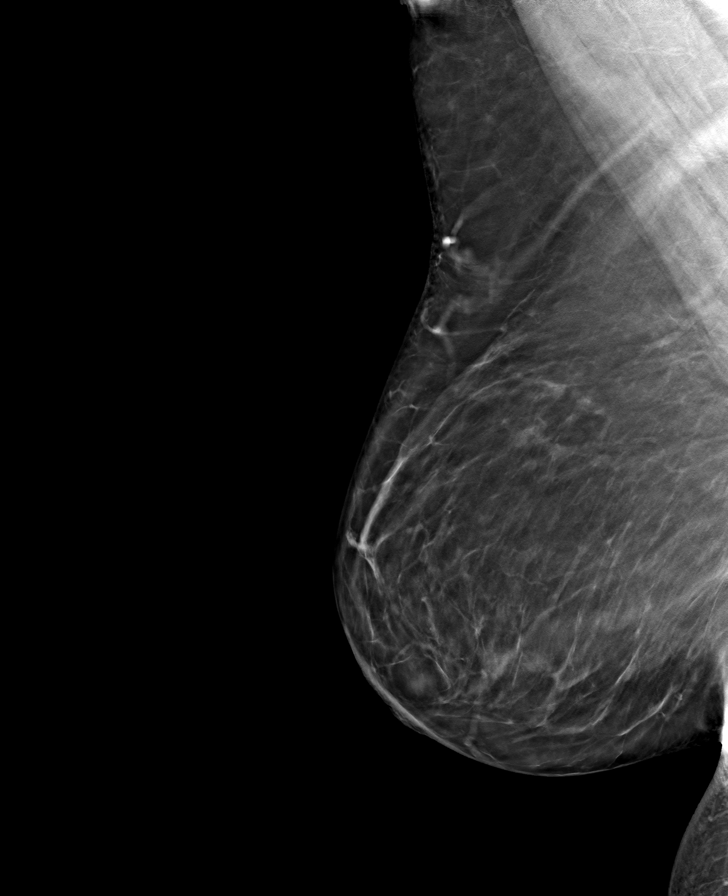

[L MLO tomo · tomo slice 40/79.0]
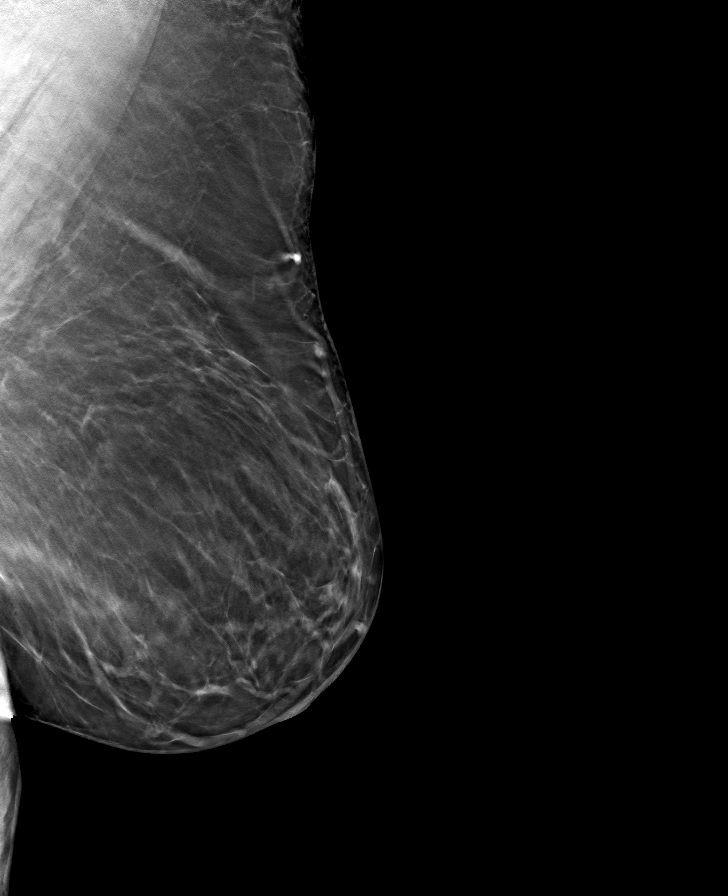

[L CC tomo · tomo slice 30/59.0]
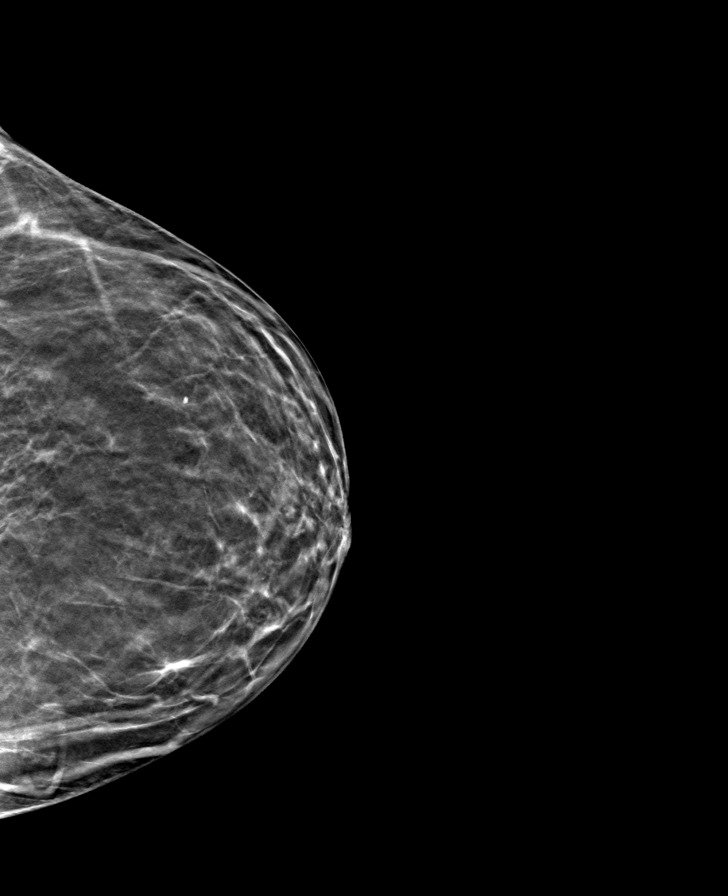

[R CC tomo · tomo slice 31/60.0]
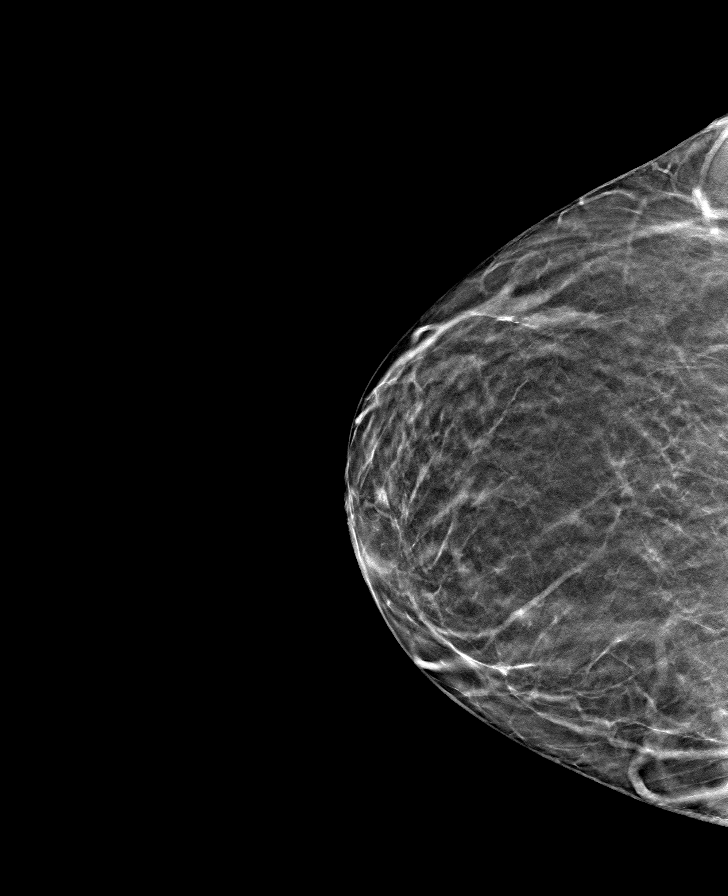

[8 of 24 positions shown; findings below may reference images not displayed]

ACR Breast Density Category b: There are scattered areas of
fibroglandular density.
FINDINGS: There are no findings suspicious for malignancy. Images were
processed with CAD.
IMPRESSION: No mammographic evidence of malignancy. A result letter of this
screening mammogram will be mailed directly to the patient.

RECOMMENDATION:
Screening mammogram in one year. (Code:[id number])

BI-RADS CATEGORY  1: Negative.

## 2020-08-26 ENCOUNTER — Other Ambulatory Visit: Payer: Self-pay | Admitting: Nephrology

## 2020-08-26 DIAGNOSIS — M545 Low back pain, unspecified: Secondary | ICD-10-CM

## 2020-08-29 ENCOUNTER — Ambulatory Visit: Payer: Medicare Other | Admitting: Critical Care Medicine

## 2020-08-29 NOTE — Progress Notes (Deleted)
Synopsis: Referred in January 2021 for hypoxia by Madison Hickman, FNP  Subjective:   PATIENT ID: [name] GENDER: female DOB: 1956/10/31, MRN: [id number]  No chief complaint on file.   HPI   Tobacco--   Chronic respiratory failure-hypoxic and hypercapnic due to COPD 3 L supplemental oxygen at rest, 4 L with activity    -Anoro once daily and Flovent twice daily.   PSG in Feb-  AHI 2.9, low SpO2 89% on 4L [place]  1 hospitalization in January in the past 5 years  ***walk today***   ABG  On 01/03/20    Component Value Date/Time   PHART 7.391 01/01/2020 0826   PCO2ART 76.8 (HH) 01/01/2020 0826   PO2ART 67.5 (L) 01/01/2020 0826   HCO3 45.7 (H) 01/01/2020 0826   TCO2 25.1 04/15/2014 0317   ACIDBASEDEF 1.8 04/15/2014 0317   O2SAT 91.7 01/01/2020 0826     OV 01/16/20: [name] is a 64 year old woman who presents for post hospital follow-up.  She was hospitalized for acute on chronic hypoxic respiratory failure due to HFpEF and COPD from 1/15-1/21.  She was discharged on 3 L supplemental oxygen at rest, 4 L with walking.  She left her oxygen in the car today due to heavy days.  She is requesting prescription for an Inogen portable concentrator due to oxygen tanks being heavy, limiting her physical activity.  Since discharge her dyspnea on exertion has been improved.  She denies wheezing and coughing.  While she was hospitalized she was on BiPAP at night; she has a polysomnogram scheduled tomorrow night.  Her current inhaler regimen is Flovent twice daily and Anoro once daily, which she has been compliant with.  She uses albuterol about once per day.  She is down to smoking 1 pack/day from her highest at 2 packs/day.  She is currently working on quitting cold [place], but would be interested in using smoking cessation medications.  She has had her seasonal flu shot and had one pneumonia shot several years ago.   Past Medical History:  Diagnosis Date  . (HFpEF) heart failure with  preserved ejection fraction ([place])   . Anxiety   . Arthritis   . Chronic respiratory failure with hypoxia and hypercapnia (HCC)   . COPD (chronic obstructive pulmonary disease) (HCC)    no inhalers--smoker, no oxygen  . Depression   . Diabetes mellitus    no meds-diet controlled  . GERD (gastroesophageal reflux disease)   . Hyperlipidemia   . Hypertension   . Hypothyroid   . Restless leg syndrome   . Seizures ([place])    02/13/2011 -hx of seizure due to "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  no seizure since--pt does take keppra and office note from neurologist dr. Leta Baptist on this chart     Family History  Problem Relation Age of Onset  . Heart attack Father 35  . Alzheimer's disease Mother   . Breast cancer Neg Hx      Past Surgical History:  Procedure Laterality Date  . CARPAL TUNNEL RELEASE     left  . JOINT REPLACEMENT  08/26/2011    right total knee arthroplasty  . KNEE ARTHROPLASTY  10/28/2011   Procedure: COMPUTER ASSISTED TOTAL KNEE ARTHROPLASTY;  Surgeon: Karen Chafe Rendall III;  Location: [facility];  Service: Orthopedics;  Laterality: Left;  preop femoral nerve block  . KNEE SURGERY  right  . laminectomy and diskectomy     L4-5 with fusion    Social History   Socioeconomic History  . Marital status: Married    Spouse name: Not on file  . Number of children: Not on file  . Years of education: Not on file  . Highest education level: Not on file  Occupational History  . Not on file  Tobacco Use  . Smoking status: Current Every Day Smoker    Packs/day: 1.00    Years: 48.00    Pack years: 48.00    Types: Cigarettes  . Smokeless tobacco: Never Used  Vaping Use  . Vaping Use: Former  . Substances: Nicotine  Substance and Sexual Activity  . Alcohol use: Not Currently  . Drug use: No  . Sexual activity: Not on file  Other Topics Concern  . Not on file    Social History Narrative   ** Merged History Encounter **       Social Determinants of Health   Financial Resource Strain:   . Difficulty of Paying Living Expenses: Not on file  Food Insecurity:   . Worried About Charity fundraiser in the Last Year: Not on file  . Ran Out of Food in the Last Year: Not on file  Transportation Needs:   . Lack of Transportation (Medical): Not on file  . Lack of Transportation (Non-Medical): Not on file  Physical Activity:   . Days of Exercise per Week: Not on file  . Minutes of Exercise per Session: Not on file  Stress:   . Feeling of Stress : Not on file  Social Connections:   . Frequency of Communication with Friends and Family: Not on file  . Frequency of Social Gatherings with Friends and Family: Not on file  . Attends Religious Services: Not on file  . Active Member of Clubs or Organizations: Not on file  . Attends Archivist Meetings: Not on file  . Marital Status: Not on file  Intimate Partner Violence:   . Fear of Current or Ex-Partner: Not on file  . Emotionally Abused: Not on file  . Physically Abused: Not on file  . Sexually Abused: Not on file     Allergies  Allergen Reactions  . Acetaminophen Other (See Comments)    Kidney issues  . Aspirin Other (See Comments)    Dr. Michela Pitcher do not take due to kidneys  . Nsaids Other (See Comments)    Pt states it messes up her kidneys  . Vicodin [Hydrocodone-Acetaminophen] Nausea And Vomiting     Immunization History  Administered Date(s) Administered  . Influenza Inj Mdck Quad Pf 08/08/2018  . Influenza-Unspecified 09/19/2019  . Pneumococcal Conjugate-13 01/16/2020  . Td 07/09/2012    Outpatient Medications Prior to Visit  Medication Sig Dispense Refill  . albuterol (VENTOLIN HFA) 108 (90 Base) MCG/ACT inhaler Inhale 2 puffs into the lungs every 4 (four) hours as needed for wheezing or shortness of breath. 18 g 11  . amLODipine (NORVASC) 5 MG tablet Take 5 mg by mouth  daily.     . ARIPiprazole (ABILIFY) 10 MG tablet Take 10 mg by mouth daily.     Marland Kitchen aspirin (ASPIRIN 81) 81 MG EC tablet Take 81 mg by mouth daily.     . beta carotene 25000 UNIT capsule Take 25,000 Units by mouth daily.    . busPIRone (BUSPAR) 30 MG tablet Take 30 mg by mouth 2 (two) times daily.    Marland Kitchen escitalopram (  LEXAPRO) 20 MG tablet Take 20 mg by mouth every morning.     . fenofibrate micronized (LOFIBRA) 134 MG capsule Take 134 mg by mouth daily before breakfast.     . fluticasone (FLOVENT HFA) 110 MCG/ACT inhaler Inhale 2 puffs into the lungs 2 (two) times daily. 12 g 0  . fluticasone (FLOVENT HFA) 110 MCG/ACT inhaler Inhale 1 puff into the lungs 2 (two) times daily. 1 Inhaler 12  . furosemide (LASIX) 40 MG tablet Take 1 tablet (40 mg total) by mouth daily. May take additional 40 mg (one tablet) as needed for swelling, weight gain or sob 180 tablet 1  . gabapentin (NEURONTIN) 800 MG tablet Take 800 mg by mouth 3 (three) times daily.     . hydrOXYzine (ATARAX/VISTARIL) 25 MG tablet Take 25 mg by mouth every 6 (six) hours as needed for anxiety or itching.     . levETIRAcetam (KEPPRA) 500 MG tablet Take 1 tablet (500 mg total) by mouth 2 (two) times daily. 60 tablet 0  . levothyroxine (SYNTHROID, LEVOTHROID) 25 MCG tablet Take 25 mcg by mouth daily before breakfast.     . losartan (COZAAR) 100 MG tablet Take 100 mg by mouth daily.     . nicotine (NICODERM CQ - DOSED IN MG/24 HOURS) 21 mg/24hr patch Place 1 patch (21 mg total) onto the skin daily. (Patient not taking: Reported on 02/05/2020) 30 patch 1  . nortriptyline (PAMELOR) 75 MG capsule Take 75 mg by mouth at bedtime.     Marland Kitchen oxyCODONE-acetaminophen (PERCOCET) 10-325 MG tablet Take 1 tablet by mouth every 4 (four) hours as needed for pain.    . pantoprazole (PROTONIX) 40 MG tablet Take 40 mg by mouth every morning.     . umeclidinium-vilanterol (ANORO ELLIPTA) 62.5-25 MCG/INH AEPB Inhale 1 puff into the lungs daily. 1 each 1  .  umeclidinium-vilanterol (ANORO ELLIPTA) 62.5-25 MCG/INH AEPB Inhale 1 puff into the lungs daily. 1 each 11  . varenicline (CHANTIX CONTINUING MONTH PAK) 1 MG tablet Take 1 tablet (1 mg total) by mouth 2 (two) times daily. (Patient not taking: Reported on 02/05/2020) 60 tablet 3  . varenicline (CHANTIX) 0.5 MG tablet Take 1 tablet (0.5 mg total) by mouth 2 (two) times daily. (Patient not taking: Reported on 02/05/2020) 60 tablet 0   Facility-Administered Medications Prior to Visit  Medication Dose Route Frequency Provider Last Rate Last Admin  . chlorhexidine (HIBICLENS) 4 % liquid 4 application  60 mL Topical Once Duffy, Kellie Shropshire, PA-C      . chlorhexidine (HIBICLENS) 4 % liquid 4 application  60 mL Topical Once Duffy, Kellie Shropshire, PA-C        Review of Systems  Constitutional: Negative.   HENT: Negative.   Respiratory: Positive for shortness of breath. Negative for cough and wheezing.      Objective:   There were no vitals filed for this visit.   on   RA BMI Readings from Last 3 Encounters:  02/05/20 33.09 kg/m  01/17/20 31.96 kg/m  01/16/20 32.60 kg/m   Wt Readings from Last 3 Encounters:  02/05/20 205 lb (93 kg)  01/17/20 198 lb (89.8 kg)  01/16/20 202 lb (91.6 kg)    Physical Exam   CBC    Component Value Date/Time   WBC 6.1 01/01/2020 0357   RBC 6.03 (H) 01/01/2020 0357   HGB 17.0 (H) 01/01/2020 0357   HCT 55.8 (H) 01/01/2020 0357   PLT 190 01/01/2020 0357   MCV 92.5 01/01/2020 0357  MCH 28.2 01/01/2020 0357   MCHC 30.5 01/01/2020 0357   RDW 18.6 (H) 01/01/2020 0357   LYMPHSABS 1.6 12/28/2019 1632   MONOABS 0.5 12/28/2019 1632   EOSABS 0.0 12/28/2019 1632   BASOSABS 0.1 12/28/2019 1632    CHEMISTRY No results for input(s): NA, K, CL, CO2, GLUCOSE, BUN, CREATININE, CALCIUM, MG, PHOS in the last 168 hours. CrCl cannot be calculated (Patient's most recent lab result is older than the maximum 21 days allowed.).   Chest Imaging- films reviewed: CTA chest  12/28/2019-several linear scars, especially in RML.  No significant emphysema.  No PE, no significant mediastinal or hilar adenopathy.  Pulmonary Functions Testing Results: No flowsheet data found.   Echocardiogram 12/29/2019: LVEF 60 to 65%, mild LVH.  Mildly dilated LA, normal RV and RA.  Mild to moderate left ear, otherwise normal valves.  Mildly elevated PASP.     Assessment & Plan:   No diagnosis found.  Chronic respiratory failure-hypoxic and hypercapnic due to COPD -Recommend continued 3 L supplemental oxygen at rest, 4 L with activity until repeat testing when her therapy is optimized. -Continue Anoro once daily and Flovent twice daily.  Instructed to rinse her mouth after every use.  Refills provided for 1 year today. -Continue albuterol as needed -Strongly encourage smoking cessation -Recommend continued physical activity as tolerated.  She walks her dog daily. -Continue Covid precautions-social distancing, mask wearing, handwashing.  Recommend the vaccine when it is available to her -Prevnar 13 today; up-to-date on seasonal flu vaccine -PSG scheduled tomorrow night; with chronic hypercapnia should likely benefit from nocturnal noninvasive ventilation  Tobacco abuse-motivated to quit -Discussed the risks, benefits, alternatives to Chantix.  She is interested in trying this.  Wellbutrin is not an option due to history of seizures.  Blackbox warning of suicidal ideations, depression, nightmares discussed, which should prompt her to stop the medication immediately.  RTC in *** months with dr. Erin Fulling.     Current Outpatient Medications:  .  albuterol (VENTOLIN HFA) 108 (90 Base) MCG/ACT inhaler, Inhale 2 puffs into the lungs every 4 (four) hours as needed for wheezing or shortness of breath., Disp: 18 g, Rfl: 11 .  amLODipine (NORVASC) 5 MG tablet, Take 5 mg by mouth daily. , Disp: , Rfl:  .  ARIPiprazole (ABILIFY) 10 MG tablet, Take 10 mg by mouth daily. , Disp: , Rfl:  .   aspirin (ASPIRIN 81) 81 MG EC tablet, Take 81 mg by mouth daily. , Disp: , Rfl:  .  beta carotene 25000 UNIT capsule, Take 25,000 Units by mouth daily., Disp: , Rfl:  .  busPIRone (BUSPAR) 30 MG tablet, Take 30 mg by mouth 2 (two) times daily., Disp: , Rfl:  .  escitalopram (LEXAPRO) 20 MG tablet, Take 20 mg by mouth every morning. , Disp: , Rfl:  .  fenofibrate micronized (LOFIBRA) 134 MG capsule, Take 134 mg by mouth daily before breakfast. , Disp: , Rfl:  .  fluticasone (FLOVENT HFA) 110 MCG/ACT inhaler, Inhale 2 puffs into the lungs 2 (two) times daily., Disp: 12 g, Rfl: 0 .  fluticasone (FLOVENT HFA) 110 MCG/ACT inhaler, Inhale 1 puff into the lungs 2 (two) times daily., Disp: 1 Inhaler, Rfl: 12 .  furosemide (LASIX) 40 MG tablet, Take 1 tablet (40 mg total) by mouth daily. May take additional 40 mg (one tablet) as needed for swelling, weight gain or sob, Disp: 180 tablet, Rfl: 1 .  gabapentin (NEURONTIN) 800 MG tablet, Take 800 mg by mouth 3 (three)  times daily. , Disp: , Rfl:  .  hydrOXYzine (ATARAX/VISTARIL) 25 MG tablet, Take 25 mg by mouth every 6 (six) hours as needed for anxiety or itching. , Disp: , Rfl:  .  levETIRAcetam (KEPPRA) 500 MG tablet, Take 1 tablet (500 mg total) by mouth 2 (two) times daily., Disp: 60 tablet, Rfl: 0 .  levothyroxine (SYNTHROID, LEVOTHROID) 25 MCG tablet, Take 25 mcg by mouth daily before breakfast. , Disp: , Rfl:  .  losartan (COZAAR) 100 MG tablet, Take 100 mg by mouth daily. , Disp: , Rfl:  .  nicotine (NICODERM CQ - DOSED IN MG/24 HOURS) 21 mg/24hr patch, Place 1 patch (21 mg total) onto the skin daily. (Patient not taking: Reported on 02/05/2020), Disp: 30 patch, Rfl: 1 .  nortriptyline (PAMELOR) 75 MG capsule, Take 75 mg by mouth at bedtime. , Disp: , Rfl:  .  oxyCODONE-acetaminophen (PERCOCET) 10-325 MG tablet, Take 1 tablet by mouth every 4 (four) hours as needed for pain., Disp: , Rfl:  .  pantoprazole (PROTONIX) 40 MG tablet, Take 40 mg by mouth every  morning. , Disp: , Rfl:  .  umeclidinium-vilanterol (ANORO ELLIPTA) 62.5-25 MCG/INH AEPB, Inhale 1 puff into the lungs daily., Disp: 1 each, Rfl: 1 .  umeclidinium-vilanterol (ANORO ELLIPTA) 62.5-25 MCG/INH AEPB, Inhale 1 puff into the lungs daily., Disp: 1 each, Rfl: 11 .  varenicline (CHANTIX CONTINUING MONTH PAK) 1 MG tablet, Take 1 tablet (1 mg total) by mouth 2 (two) times daily. (Patient not taking: Reported on 02/05/2020), Disp: 60 tablet, Rfl: 3 .  varenicline (CHANTIX) 0.5 MG tablet, Take 1 tablet (0.5 mg total) by mouth 2 (two) times daily. (Patient not taking: Reported on 02/05/2020), Disp: 60 tablet, Rfl: 0 No current facility-administered medications for this visit.  Facility-Administered Medications Ordered in Other Visits:  .  chlorhexidine (HIBICLENS) 4 % liquid 4 application, 60 mL, Topical, Once, Duffy, Kellie Shropshire, PA-C .  chlorhexidine (HIBICLENS) 4 % liquid 4 application, 60 mL, Topical, Once, Duffy, Kellie Shropshire, PA-C     Julian Hy, DO [facility] 08/29/2020 8:46 AM

## 2020-09-03 ENCOUNTER — Encounter (HOSPITAL_COMMUNITY): Payer: Self-pay | Admitting: Emergency Medicine

## 2020-09-03 ENCOUNTER — Emergency Department (HOSPITAL_COMMUNITY)
Admission: EM | Admit: 2020-09-03 | Discharge: 2020-09-03 | Disposition: A | Discharge status: Home / Self Care | Payer: Medicare Other | Attending: Emergency Medicine | Admitting: Emergency Medicine

## 2020-09-03 ENCOUNTER — Other Ambulatory Visit: Payer: Self-pay

## 2020-09-03 DIAGNOSIS — E119 Type 2 diabetes mellitus without complications: Secondary | ICD-10-CM | POA: Insufficient documentation

## 2020-09-03 DIAGNOSIS — Z79899 Other long term (current) drug therapy: Secondary | ICD-10-CM | POA: Insufficient documentation

## 2020-09-03 DIAGNOSIS — Z96651 Presence of right artificial knee joint: Secondary | ICD-10-CM | POA: Diagnosis not present

## 2020-09-03 DIAGNOSIS — E039 Hypothyroidism, unspecified: Secondary | ICD-10-CM | POA: Insufficient documentation

## 2020-09-03 DIAGNOSIS — L03115 Cellulitis of right lower limb: Secondary | ICD-10-CM | POA: Diagnosis present

## 2020-09-03 DIAGNOSIS — Z7951 Long term (current) use of inhaled steroids: Secondary | ICD-10-CM | POA: Insufficient documentation

## 2020-09-03 DIAGNOSIS — I5033 Acute on chronic diastolic (congestive) heart failure: Secondary | ICD-10-CM | POA: Diagnosis not present

## 2020-09-03 DIAGNOSIS — F17210 Nicotine dependence, cigarettes, uncomplicated: Secondary | ICD-10-CM | POA: Insufficient documentation

## 2020-09-03 DIAGNOSIS — I110 Hypertensive heart disease with heart failure: Secondary | ICD-10-CM | POA: Insufficient documentation

## 2020-09-03 DIAGNOSIS — Z79890 Hormone replacement therapy (postmenopausal): Secondary | ICD-10-CM | POA: Diagnosis not present

## 2020-09-03 DIAGNOSIS — J441 Chronic obstructive pulmonary disease with (acute) exacerbation: Secondary | ICD-10-CM | POA: Diagnosis not present

## 2020-09-03 DIAGNOSIS — Z7982 Long term (current) use of aspirin: Secondary | ICD-10-CM | POA: Diagnosis not present

## 2020-09-03 LAB — CBC WITH DIFFERENTIAL/PLATELET
Abs Immature Granulocytes: 0.09 10*3/uL — ABNORMAL HIGH (ref 0.00–0.07)
Basophils Absolute: 0.1 10*3/uL (ref 0.0–0.1)
Basophils Relative: 1 %
Eosinophils Absolute: 0.3 10*3/uL (ref 0.0–0.5)
Eosinophils Relative: 3 %
HCT: 47.5 % — ABNORMAL HIGH (ref 36.0–46.0)
Hemoglobin: 15.9 g/dL — ABNORMAL HIGH (ref 12.0–15.0)
Immature Granulocytes: 1 %
Lymphocytes Relative: 21 %
Lymphs Abs: 2 10*3/uL (ref 0.7–4.0)
MCH: 32.2 pg (ref 26.0–34.0)
MCHC: 33.5 g/dL (ref 30.0–36.0)
MCV: 96.2 fL (ref 80.0–100.0)
Monocytes Absolute: 0.8 10*3/uL (ref 0.1–1.0)
Monocytes Relative: 9 %
Neutro Abs: 6.3 10*3/uL (ref 1.7–7.7)
Neutrophils Relative %: 65 %
Platelets: 219 10*3/uL (ref 150–400)
RBC: 4.94 MIL/uL (ref 3.87–5.11)
RDW: 15.6 % — ABNORMAL HIGH (ref 11.5–15.5)
WBC: 9.6 10*3/uL (ref 4.0–10.5)
nRBC: 0 % (ref 0.0–0.2)

## 2020-09-03 LAB — URINALYSIS, ROUTINE W REFLEX MICROSCOPIC
Bacteria, UA: NONE SEEN
Bilirubin Urine: NEGATIVE
Glucose, UA: NEGATIVE mg/dL
Hgb urine dipstick: NEGATIVE
Ketones, ur: NEGATIVE mg/dL
Nitrite: NEGATIVE
Protein, ur: NEGATIVE mg/dL
Specific Gravity, Urine: 1.005 (ref 1.005–1.030)
pH: 5 (ref 5.0–8.0)

## 2020-09-03 LAB — COMPREHENSIVE METABOLIC PANEL
ALT: 19 U/L (ref 0–44)
AST: 22 U/L (ref 15–41)
Albumin: 3.6 g/dL (ref 3.5–5.0)
Alkaline Phosphatase: 18 U/L — ABNORMAL LOW (ref 38–126)
Anion gap: 10 (ref 5–15)
BUN: 14 mg/dL (ref 8–23)
CO2: 26 mmol/L (ref 22–32)
Calcium: 9.2 mg/dL (ref 8.9–10.3)
Chloride: 102 mmol/L (ref 98–111)
Creatinine, Ser: 1.04 mg/dL — ABNORMAL HIGH (ref 0.44–1.00)
GFR calc Af Amer: >60 mL/min (ref >60)
GFR calc non Af Amer: 57 mL/min — ABNORMAL LOW (ref >60)
Glucose, Bld: 167 mg/dL — ABNORMAL HIGH (ref 70–99)
Potassium: 3.9 mmol/L (ref 3.5–5.1)
Sodium: 138 mmol/L (ref 135–145)
Total Bilirubin: 0.8 mg/dL (ref 0.3–1.2)
Total Protein: 7.2 g/dL (ref 6.5–8.1)

## 2020-09-03 LAB — LACTIC ACID, PLASMA: Lactic Acid, Venous: 1.6 mmol/L (ref 0.5–1.9)

## 2020-09-03 MED ORDER — DOXYCYCLINE HYCLATE 100 MG PO CAPS: 100.0000 mg | ORAL_CAPSULE | Freq: Two times a day (BID) | ORAL | 0 refills | Status: DC

## 2020-09-03 MED ORDER — DOXYCYCLINE HYCLATE 100 MG PO TABS
100.0000 mg | ORAL_TABLET | Freq: Once | ORAL | Status: AC
  Administered 2020-09-03: 100 mg via ORAL
  Filled 2020-09-03: qty 1

## 2020-09-03 NOTE — ED Triage Notes (Signed)
Per pt, states LE cellulitis since Saturday-red, swollen-PCP told her to come to ED for IV antibiotics-has been on oral for a few days

## 2020-09-03 NOTE — Discharge Instructions (Addendum)
Add doxycycline to current treatment regimen. Apply compression stockings as discussed. Follow-up with your primary care provider. Return to ED if symptoms do not continue to improve.

## 2020-09-03 NOTE — ED Provider Notes (Signed)
[facility] DEPT Provider Note   CSN: [id number] Arrival date & time: 09/03/20  1435     History Chief Complaint  Patient presents with  . Leg Pain  . Leg Swelling    [name] is a 64 y.o. female.  Patient reports to ED with complaint of leg swelling and redness. She was started on keflex by her PCP. Redness has improved. Patient in contact with PCP, who recommended ED evaluation for possible IV antibiotics. She is not diabetic. No history of PVD.  The history is provided by the patient. No language interpreter was used.  Leg Pain Location:  Leg Leg location:  R lower leg Chronicity:  New Associated symptoms: no fever        Past Medical History:  Diagnosis Date  . (HFpEF) heart failure with preserved ejection fraction ([place])   . Anxiety   . Arthritis   . Chronic respiratory failure with hypoxia and hypercapnia (HCC)   . COPD (chronic obstructive pulmonary disease) (HCC)    no inhalers--smoker, no oxygen  . Depression   . Diabetes mellitus    no meds-diet controlled  . GERD (gastroesophageal reflux disease)   . Hyperlipidemia   . Hypertension   . Hypothyroid   . Restless leg syndrome   . Seizures ([place])    02/13/2011 -hx of seizure due to "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  no seizure since--pt does take keppra and office note from neurologist dr. Leta Baptist on this chart    Patient Active Problem List   Diagnosis Date Noted  . Leg pain 02/05/2020  . CHF exacerbation ([place]) 12/28/2019  . Elevated troponin 12/28/2019  . Myofascial pain syndrome 08/23/2018  . Lumbar spondylosis 08/23/2018  . Leg swelling 07/31/2018  . SOB (shortness of breath) 07/31/2018  . Tobacco abuse 07/31/2018  . Fusion of lumbar spine (L3-S1) 07/20/2018  . 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 Lumbar degenerative disc disease 07/20/2018  .  Drug overdose 04/18/2014  . Chronic pain syndrome 04/18/2014  . 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 Respiratory failure ([place]) 04/14/2014  . Acute respiratory failure ([place]) 04/14/2014  . UTI (urinary tract infection) 04/28/2013  . Acute renal failure (ARF) ([place]) 04/28/2013  . Weakness generalized 04/28/2013  . Sepsis ([place]) 04/28/2013  . Chronic diastolic CHF (congestive heart failure) ([place]) 04/28/2013  . Hypokalemia 01/11/2013  . Sinus tachycardia 01/11/2013  . Acute respiratory failure with hypoxia ([place]) 01/10/2013  . COPD exacerbation ([place]) 01/10/2013  . Community acquired pneumonia 01/10/2013  . Diabetes mellitus, type 2 ([place]) 01/10/2013  . Diastolic CHF, acute on chronic (HCC) 01/10/2013  . Osteoarthritis of left knee 10/27/2011  . Hypoxemia 03/29/2011  . Acute renal failure ([place]) 03/29/2011  . COPD (chronic obstructive pulmonary disease) ([place]) 03/29/2011  . HTN (hypertension) 03/29/2011  . Hypotension 03/25/2011    Past Surgical History:  Procedure Laterality Date  . CARPAL TUNNEL RELEASE     left  . JOINT REPLACEMENT  08/26/2011    right total knee arthroplasty  . KNEE ARTHROPLASTY  10/28/2011   Procedure: COMPUTER ASSISTED TOTAL KNEE ARTHROPLASTY;  Surgeon: Karen Chafe Rendall III;  Location: [facility] ORS;  Service: Orthopedics;  Laterality: Left;  preop femoral nerve block  . KNEE SURGERY     right  . laminectomy and diskectomy     L4-5 with fusion  OB History   No obstetric history on file.     Family History  Problem Relation Age of Onset  . Heart attack Father 72  . Alzheimer's disease Mother   . Breast cancer Neg Hx     Social History   Tobacco Use  . Smoking status: Current Every Day Smoker    Packs/day: 1.00    Years: 48.00    Pack years: 48.00    Types: Cigarettes  . Smokeless tobacco: Never Used  Vaping Use  . Vaping Use: Former  . Substances: Nicotine  Substance Use Topics  . Alcohol use: Not  Currently  . Drug use: No    Home Medications Prior to Admission medications   Medication Sig Start Date End Date Taking? Authorizing Provider  albuterol (VENTOLIN HFA) 108 (90 Base) MCG/ACT inhaler Inhale 2 puffs into the lungs every 4 (four) hours as needed for wheezing or shortness of breath. 01/16/20  Yes Rachel Chapel P, DO  amLODipine (NORVASC) 5 MG tablet Take 5 mg by mouth daily.  07/04/17  Yes [provider]  ARIPiprazole (ABILIFY) 10 MG tablet Take 10 mg by mouth daily.  07/28/17  Yes [provider]  aspirin (ASPIRIN 81) 81 MG EC tablet Take 81 mg by mouth daily.    Yes [provider]  busPIRone (BUSPAR) 30 MG tablet Take 30 mg by mouth 2 (two) times daily.   Yes [provider]  cephALEXin (KEFLEX) 500 MG capsule Take 500 mg by mouth every 6 (six) hours. Start date 08/30/20 08/30/20  Yes [provider]  escitalopram (LEXAPRO) 20 MG tablet Take 20 mg by mouth every morning.    Yes [provider]  fenofibrate micronized (LOFIBRA) 134 MG capsule Take 134 mg by mouth daily before breakfast.    Yes [provider]  furosemide (LASIX) 40 MG tablet Take 1 tablet (40 mg total) by mouth daily. May take additional 40 mg (one tablet) as needed for swelling, weight gain or sob Patient taking differently: Take 40 mg by mouth 2 (two) times daily.  01/03/20 12/28/20 Yes Rachel Riding, MD  gabapentin (NEURONTIN) 800 MG tablet Take 800 mg by mouth 3 (three) times daily.  06/23/17  Yes [provider]  hydrOXYzine (ATARAX/VISTARIL) 25 MG tablet Take 25 mg by mouth every 6 (six) hours as needed for anxiety or itching.  07/28/17  Yes [provider]  levothyroxine (SYNTHROID, LEVOTHROID) 25 MCG tablet Take 25 mcg by mouth daily before breakfast.  06/09/17  Yes [provider]  losartan (COZAAR) 100 MG tablet Take 100 mg by mouth daily.  07/20/17  Yes [provider]  pantoprazole (PROTONIX) 40 MG tablet Take 40 mg  by mouth every morning.  08/16/13  Yes [provider]  SYMBICORT 160-4.5 MCG/ACT inhaler Inhale 2 puffs into the lungs daily.  08/08/20  Yes [provider]  umeclidinium-vilanterol (ANORO ELLIPTA) 62.5-25 MCG/INH AEPB Inhale 1 puff into the lungs daily. 01/16/20  Yes Rachel Chapel P, DO  fluticasone (FLOVENT HFA) 110 MCG/ACT inhaler Inhale 2 puffs into the lungs 2 (two) times daily. Patient not taking: Reported on 09/03/2020 01/02/20 09/03/20  Rachel Riding, MD  fluticasone (FLOVENT HFA) 110 MCG/ACT inhaler Inhale 1 puff into the lungs 2 (two) times daily. Patient not taking: Reported on 09/03/2020 01/16/20   Rachel Chapel P, DO  levETIRAcetam (KEPPRA) 500 MG tablet Take 1 tablet (500 mg total) by mouth 2 (two) times daily. Patient not taking: Reported on 09/03/2020 04/18/14  Rachel Cellar, MD  nicotine (NICODERM CQ - DOSED IN MG/24 HOURS) 21 mg/24hr patch Place 1 patch (21 mg total) onto the skin daily. Patient not taking: Reported on 02/05/2020 01/02/20 01/01/21  Rachel Riding, MD  SUBOXONE 8-2 MG FILM Place under the tongue daily. 08/27/20   [provider]  umeclidinium-vilanterol (ANORO ELLIPTA) 62.5-25 MCG/INH AEPB Inhale 1 puff into the lungs daily. Patient not taking: Reported on 09/03/2020 01/02/20   Rachel Riding, MD  varenicline (CHANTIX CONTINUING MONTH PAK) 1 MG tablet Take 1 tablet (1 mg total) by mouth 2 (two) times daily. Patient not taking: Reported on 02/05/2020 01/16/20   Rachel Chapel P, DO  varenicline (CHANTIX) 0.5 MG tablet Take 1 tablet (0.5 mg total) by mouth 2 (two) times daily. Patient not taking: Reported on 02/05/2020 01/16/20   Rachel Chapel P, DO    Allergies    Acetaminophen, Aspirin, Nsaids, and Vicodin [hydrocodone-acetaminophen]  Review of Systems   Review of Systems  Constitutional: Negative for fever.  Skin: Positive for color change.  All other systems reviewed and are negative.   Physical Exam Updated Vital Signs BP (!) 160/68 (BP Location:  Right Arm)   Pulse 64   Temp 97.9 F (36.6 C) (Oral)   Resp 17   Ht 5\' 6"  (1.676 m)   Wt 98 kg   SpO2 90%   BMI 34.86 kg/m   Physical Exam Vitals and nursing note reviewed.  HENT:     Head: Normocephalic.     Nose: Nose normal.     Mouth/Throat:     Mouth: Mucous membranes are moist.  Eyes:     Conjunctiva/sclera: Conjunctivae normal.  Cardiovascular:     Rate and Rhythm: Normal rate and regular rhythm.  Pulmonary:     Effort: Pulmonary effort is normal.     Breath sounds: Normal breath sounds.  Abdominal:     Palpations: Abdomen is soft.  Musculoskeletal:        General: Swelling present.     Right lower leg: Edema present.  Skin:    General: Skin is warm and dry.     Findings: Erythema present.  Neurological:     Mental Status: She is alert and oriented to person, place, and time.  Psychiatric:        Mood and Affect: Mood normal.        Behavior: Behavior normal.         ED Results / Procedures / Treatments   Labs (all labs ordered are listed, but only abnormal results are displayed) Labs Reviewed  CBC WITH DIFFERENTIAL/PLATELET - Abnormal; Notable for the following components:      Result Value   Hemoglobin 15.9 (*)    HCT 47.5 (*)    RDW 15.6 (*)    Abs Immature Granulocytes 0.09 (*)    All other components within normal limits  LACTIC ACID, PLASMA  LACTIC ACID, PLASMA  COMPREHENSIVE METABOLIC PANEL  URINALYSIS, ROUTINE W REFLEX MICROSCOPIC    EKG None  Radiology No results found.  Procedures Procedures (including critical care time)  Medications Ordered in ED Medications - No data to display  ED Course  I have reviewed the triage vital signs and the nursing notes.  Pertinent labs & imaging results that were available during my care of the patient were reviewed by me and considered in my medical decision making (see chart for details).    MDM Rules/Calculators/A&Harrington  Patient presentation consistent with  cellulitis. Afebrile. No tachycardia, hypotension or other symptoms suggestive of severe infection. Area has been demarcated and pt advised to follow up for wound check in 2-3 days, sooner for worsening systemic symptoms, new lymphangitis, or significant spread of erythema past line of demarcation. Will discharge with addition of  Doxycycline to current treatment plan. First dose given in ED. Return precautions discussed. Pt appears safe for discharge.  Final Clinical Impression(s) / ED Diagnoses Final diagnoses:  Cellulitis of right lower extremity    Rx / DC Orders ED Discharge Orders         Ordered    doxycycline (VIBRAMYCIN) 100 MG capsule  2 times daily        09/03/20 1720           Etta Quill, NP 09/03/20 1725    Blanchie Dessert, MD 09/04/20 1235

## 2020-09-20 ENCOUNTER — Ambulatory Visit
Admission: RE | Admit: 2020-09-20 | Discharge: 2020-09-20 | Disposition: A | Discharge status: Home / Self Care | Payer: Medicare Other | Source: Ambulatory Visit | Attending: Nephrology | Admitting: Nephrology

## 2020-09-20 DIAGNOSIS — M545 Low back pain, unspecified: Secondary | ICD-10-CM

## 2020-11-12 DIAGNOSIS — R0603 Acute respiratory distress: Secondary | ICD-10-CM

## 2020-11-12 HISTORY — DX: Acute respiratory distress: R06.03

## 2020-11-17 ENCOUNTER — Other Ambulatory Visit: Payer: Self-pay

## 2020-11-17 ENCOUNTER — Emergency Department (HOSPITAL_COMMUNITY): Payer: Medicare Other

## 2020-11-17 ENCOUNTER — Encounter (HOSPITAL_COMMUNITY): Payer: Self-pay

## 2020-11-17 ENCOUNTER — Inpatient Hospital Stay (HOSPITAL_COMMUNITY)
Admission: EM | Admit: 2020-11-17 | Discharge: 2020-11-19 | DRG: 291 | Disposition: A | Discharge status: Home / Self Care | Payer: Medicare Other | Attending: Internal Medicine | Admitting: Internal Medicine

## 2020-11-17 DIAGNOSIS — E785 Hyperlipidemia, unspecified: Secondary | ICD-10-CM | POA: Diagnosis present

## 2020-11-17 DIAGNOSIS — I371 Nonrheumatic pulmonary valve insufficiency: Secondary | ICD-10-CM | POA: Diagnosis not present

## 2020-11-17 DIAGNOSIS — E119 Type 2 diabetes mellitus without complications: Secondary | ICD-10-CM | POA: Diagnosis not present

## 2020-11-17 DIAGNOSIS — Z9981 Dependence on supplemental oxygen: Secondary | ICD-10-CM

## 2020-11-17 DIAGNOSIS — I5033 Acute on chronic diastolic (congestive) heart failure: Secondary | ICD-10-CM | POA: Diagnosis present

## 2020-11-17 DIAGNOSIS — Z6832 Body mass index (BMI) 32.0-32.9, adult: Secondary | ICD-10-CM | POA: Diagnosis not present

## 2020-11-17 DIAGNOSIS — I10 Essential (primary) hypertension: Secondary | ICD-10-CM | POA: Diagnosis not present

## 2020-11-17 DIAGNOSIS — G8929 Other chronic pain: Secondary | ICD-10-CM | POA: Diagnosis present

## 2020-11-17 DIAGNOSIS — Z720 Tobacco use: Secondary | ICD-10-CM | POA: Diagnosis not present

## 2020-11-17 DIAGNOSIS — R0602 Shortness of breath: Secondary | ICD-10-CM | POA: Diagnosis present

## 2020-11-17 DIAGNOSIS — G2581 Restless legs syndrome: Secondary | ICD-10-CM | POA: Diagnosis present

## 2020-11-17 DIAGNOSIS — E1151 Type 2 diabetes mellitus with diabetic peripheral angiopathy without gangrene: Secondary | ICD-10-CM | POA: Diagnosis present

## 2020-11-17 DIAGNOSIS — K219 Gastro-esophageal reflux disease without esophagitis: Secondary | ICD-10-CM | POA: Diagnosis present

## 2020-11-17 DIAGNOSIS — J9621 Acute and chronic respiratory failure with hypoxia: Secondary | ICD-10-CM | POA: Diagnosis present

## 2020-11-17 DIAGNOSIS — Z96651 Presence of right artificial knee joint: Secondary | ICD-10-CM | POA: Diagnosis present

## 2020-11-17 DIAGNOSIS — Z7951 Long term (current) use of inhaled steroids: Secondary | ICD-10-CM

## 2020-11-17 DIAGNOSIS — Z820 Family history of epilepsy and other diseases of the nervous system: Secondary | ICD-10-CM | POA: Diagnosis not present

## 2020-11-17 DIAGNOSIS — M1990 Unspecified osteoarthritis, unspecified site: Secondary | ICD-10-CM | POA: Diagnosis present

## 2020-11-17 DIAGNOSIS — Z8249 Family history of ischemic heart disease and other diseases of the circulatory system: Secondary | ICD-10-CM | POA: Diagnosis not present

## 2020-11-17 DIAGNOSIS — J441 Chronic obstructive pulmonary disease with (acute) exacerbation: Secondary | ICD-10-CM | POA: Diagnosis present

## 2020-11-17 DIAGNOSIS — E46 Unspecified protein-calorie malnutrition: Secondary | ICD-10-CM | POA: Diagnosis present

## 2020-11-17 DIAGNOSIS — L89152 Pressure ulcer of sacral region, stage 2: Secondary | ICD-10-CM | POA: Diagnosis present

## 2020-11-17 DIAGNOSIS — R569 Unspecified convulsions: Secondary | ICD-10-CM | POA: Diagnosis present

## 2020-11-17 DIAGNOSIS — J9601 Acute respiratory failure with hypoxia: Secondary | ICD-10-CM

## 2020-11-17 DIAGNOSIS — I340 Nonrheumatic mitral (valve) insufficiency: Secondary | ICD-10-CM | POA: Diagnosis not present

## 2020-11-17 DIAGNOSIS — Z7982 Long term (current) use of aspirin: Secondary | ICD-10-CM

## 2020-11-17 DIAGNOSIS — Z885 Allergy status to narcotic agent status: Secondary | ICD-10-CM

## 2020-11-17 DIAGNOSIS — Z20822 Contact with and (suspected) exposure to covid-19: Secondary | ICD-10-CM | POA: Diagnosis present

## 2020-11-17 DIAGNOSIS — D696 Thrombocytopenia, unspecified: Secondary | ICD-10-CM | POA: Diagnosis present

## 2020-11-17 DIAGNOSIS — E039 Hypothyroidism, unspecified: Secondary | ICD-10-CM | POA: Diagnosis present

## 2020-11-17 DIAGNOSIS — R079 Chest pain, unspecified: Secondary | ICD-10-CM | POA: Diagnosis not present

## 2020-11-17 DIAGNOSIS — Z886 Allergy status to analgesic agent status: Secondary | ICD-10-CM

## 2020-11-17 DIAGNOSIS — Z79899 Other long term (current) drug therapy: Secondary | ICD-10-CM

## 2020-11-17 DIAGNOSIS — Z79890 Hormone replacement therapy (postmenopausal): Secondary | ICD-10-CM

## 2020-11-17 DIAGNOSIS — I110 Hypertensive heart disease with heart failure: Secondary | ICD-10-CM | POA: Diagnosis present

## 2020-11-17 DIAGNOSIS — J9622 Acute and chronic respiratory failure with hypercapnia: Secondary | ICD-10-CM | POA: Diagnosis present

## 2020-11-17 DIAGNOSIS — G894 Chronic pain syndrome: Secondary | ICD-10-CM | POA: Diagnosis present

## 2020-11-17 DIAGNOSIS — Z888 Allergy status to other drugs, medicaments and biological substances status: Secondary | ICD-10-CM

## 2020-11-17 DIAGNOSIS — F17210 Nicotine dependence, cigarettes, uncomplicated: Secondary | ICD-10-CM | POA: Diagnosis present

## 2020-11-17 HISTORY — DX: Dyspnea, unspecified: R06.00

## 2020-11-17 LAB — COMPREHENSIVE METABOLIC PANEL
ALT: 30 U/L (ref 0–44)
AST: 53 U/L — ABNORMAL HIGH (ref 15–41)
Albumin: 2.8 g/dL — ABNORMAL LOW (ref 3.5–5.0)
Alkaline Phosphatase: 18 U/L — ABNORMAL LOW (ref 38–126)
Anion gap: 11 (ref 5–15)
BUN: 7 mg/dL — ABNORMAL LOW (ref 8–23)
CO2: 30 mmol/L (ref 22–32)
Calcium: 9.2 mg/dL (ref 8.9–10.3)
Chloride: 98 mmol/L (ref 98–111)
Creatinine, Ser: 0.95 mg/dL (ref 0.44–1.00)
GFR, Estimated: >60 mL/min (ref >60)
Glucose, Bld: 84 mg/dL (ref 70–99)
Potassium: 3.7 mmol/L (ref 3.5–5.1)
Sodium: 139 mmol/L (ref 135–145)
Total Bilirubin: 2.6 mg/dL — ABNORMAL HIGH (ref 0.3–1.2)
Total Protein: 6.3 g/dL — ABNORMAL LOW (ref 6.5–8.1)

## 2020-11-17 LAB — CBC WITH DIFFERENTIAL/PLATELET
Abs Immature Granulocytes: 0.1 10*3/uL — ABNORMAL HIGH (ref 0.00–0.07)
Basophils Absolute: 0.1 10*3/uL (ref 0.0–0.1)
Basophils Relative: 1 %
Eosinophils Absolute: 0.1 10*3/uL (ref 0.0–0.5)
Eosinophils Relative: 1 %
HCT: 51.3 % — ABNORMAL HIGH (ref 36.0–46.0)
Hemoglobin: 16.5 g/dL — ABNORMAL HIGH (ref 12.0–15.0)
Immature Granulocytes: 1 %
Lymphocytes Relative: 12 %
Lymphs Abs: 0.9 10*3/uL (ref 0.7–4.0)
MCH: 29.3 pg (ref 26.0–34.0)
MCHC: 32.2 g/dL (ref 30.0–36.0)
MCV: 91.1 fL (ref 80.0–100.0)
Monocytes Absolute: 0.5 10*3/uL (ref 0.1–1.0)
Monocytes Relative: 6 %
Neutro Abs: 6.3 10*3/uL (ref 1.7–7.7)
Neutrophils Relative %: 79 %
Platelets: 147 10*3/uL — ABNORMAL LOW (ref 150–400)
RBC: 5.63 MIL/uL — ABNORMAL HIGH (ref 3.87–5.11)
RDW: 19 % — ABNORMAL HIGH (ref 11.5–15.5)
WBC: 7.9 10*3/uL (ref 4.0–10.5)
nRBC: 0 % (ref 0.0–0.2)

## 2020-11-17 LAB — BLOOD GAS, ARTERIAL
Acid-Base Excess: 10.3 mmol/L — ABNORMAL HIGH (ref 0.0–2.0)
Bicarbonate: 35.4 mmol/L — ABNORMAL HIGH (ref 20.0–28.0)
Drawn by: 53547
FIO2: 32
O2 Saturation: 90 %
Patient temperature: 37
pCO2 arterial: 58.3 mmHg — ABNORMAL HIGH (ref 32.0–48.0)
pH, Arterial: 7.4 (ref 7.350–7.450)
pO2, Arterial: 59.8 mmHg — ABNORMAL LOW (ref 83.0–108.0)

## 2020-11-17 LAB — TSH: TSH: 1.24 u[IU]/mL (ref 0.350–4.500)

## 2020-11-17 LAB — RAPID URINE DRUG SCREEN, HOSP PERFORMED
Amphetamines: NOT DETECTED
Barbiturates: NOT DETECTED
Benzodiazepines: NOT DETECTED
Cocaine: NOT DETECTED
Opiates: NOT DETECTED
Tetrahydrocannabinol: NOT DETECTED

## 2020-11-17 LAB — RESP PANEL BY RT-PCR (FLU A&B, COVID) ARPGX2
Influenza A by PCR: NEGATIVE
Influenza B by PCR: NEGATIVE
SARS Coronavirus 2 by RT PCR: NEGATIVE

## 2020-11-17 LAB — BRAIN NATRIURETIC PEPTIDE: B Natriuretic Peptide: 712.6 pg/mL — ABNORMAL HIGH (ref 0.0–100.0)

## 2020-11-17 LAB — C-REACTIVE PROTEIN: CRP: 4.4 mg/dL — ABNORMAL HIGH (ref <1.0)

## 2020-11-17 LAB — TROPONIN I (HIGH SENSITIVITY): Troponin I (High Sensitivity): 13 ng/L (ref <18)

## 2020-11-17 MED ORDER — PANTOPRAZOLE SODIUM 40 MG PO TBEC
40.0000 mg | DELAYED_RELEASE_TABLET | Freq: Every morning | ORAL | Status: DC
  Administered 2020-11-18 – 2020-11-19 (×2): 40 mg via ORAL
  Filled 2020-11-17 (×2): qty 1

## 2020-11-17 MED ORDER — ALBUTEROL (5 MG/ML) CONTINUOUS INHALATION SOLN
10.0000 mg | INHALATION_SOLUTION | RESPIRATORY_TRACT | Status: AC
  Administered 2020-11-17: 10 mg via RESPIRATORY_TRACT

## 2020-11-17 MED ORDER — BUDESONIDE 0.5 MG/2ML IN SUSP
0.5000 mg | Freq: Two times a day (BID) | RESPIRATORY_TRACT | Status: DC
  Administered 2020-11-17 – 2020-11-19 (×4): 0.5 mg via RESPIRATORY_TRACT
  Filled 2020-11-17 (×4): qty 2

## 2020-11-17 MED ORDER — FENOFIBRATE 160 MG PO TABS
160.0000 mg | ORAL_TABLET | Freq: Every day | ORAL | Status: DC
  Administered 2020-11-17 – 2020-11-19 (×3): 160 mg via ORAL
  Filled 2020-11-17 (×3): qty 1

## 2020-11-17 MED ORDER — SODIUM CHLORIDE 0.9 % IV SOLN
500.0000 mg | Freq: Once | INTRAVENOUS | Status: AC
  Administered 2020-11-17: 500 mg via INTRAVENOUS
  Filled 2020-11-17: qty 500

## 2020-11-17 MED ORDER — ONDANSETRON HCL 4 MG PO TABS: 4.0000 mg | ORAL_TABLET | Freq: Four times a day (QID) | ORAL | Status: DC | PRN

## 2020-11-17 MED ORDER — GUAIFENESIN ER 600 MG PO TB12
600.0000 mg | ORAL_TABLET | Freq: Two times a day (BID) | ORAL | Status: DC
  Administered 2020-11-17 – 2020-11-19 (×5): 600 mg via ORAL
  Filled 2020-11-17 (×5): qty 1

## 2020-11-17 MED ORDER — SODIUM CHLORIDE 0.9% FLUSH
3.0000 mL | Freq: Two times a day (BID) | INTRAVENOUS | Status: DC
  Administered 2020-11-17 – 2020-11-19 (×5): 3 mL via INTRAVENOUS

## 2020-11-17 MED ORDER — METHYLPREDNISOLONE SODIUM SUCC 125 MG IJ SOLR
125.0000 mg | Freq: Once | INTRAMUSCULAR | Status: AC
  Administered 2020-11-17: 125 mg via INTRAVENOUS
  Filled 2020-11-17: qty 2

## 2020-11-17 MED ORDER — AEROCHAMBER PLUS FLO-VU LARGE MISC
Status: AC
  Administered 2020-11-17: 1
  Filled 2020-11-17: qty 1

## 2020-11-17 MED ORDER — IPRATROPIUM-ALBUTEROL 0.5-2.5 (3) MG/3ML IN SOLN
3.0000 mL | Freq: Four times a day (QID) | RESPIRATORY_TRACT | Status: DC
  Filled 2020-11-17: qty 3

## 2020-11-17 MED ORDER — SODIUM CHLORIDE 0.9 % IV SOLN
1.0000 g | INTRAVENOUS | Status: DC
  Administered 2020-11-18 – 2020-11-19 (×2): 1 g via INTRAVENOUS
  Filled 2020-11-17 (×3): qty 10

## 2020-11-17 MED ORDER — ONDANSETRON HCL 4 MG/2ML IJ SOLN: 4.0000 mg | Freq: Four times a day (QID) | INTRAMUSCULAR | Status: DC | PRN

## 2020-11-17 MED ORDER — SODIUM CHLORIDE 0.9 % IV SOLN
1.0000 g | Freq: Once | INTRAVENOUS | Status: AC
  Administered 2020-11-17: 1 g via INTRAVENOUS
  Filled 2020-11-17: qty 10

## 2020-11-17 MED ORDER — ARIPIPRAZOLE 5 MG PO TABS
10.0000 mg | ORAL_TABLET | Freq: Every day | ORAL | Status: DC
  Administered 2020-11-17 – 2020-11-19 (×3): 10 mg via ORAL
  Filled 2020-11-17 (×3): qty 2

## 2020-11-17 MED ORDER — MAGNESIUM SULFATE 2 GM/50ML IV SOLN
2.0000 g | INTRAVENOUS | Status: AC
  Administered 2020-11-17: 2 g via INTRAVENOUS
  Filled 2020-11-17: qty 50

## 2020-11-17 MED ORDER — BUSPIRONE HCL 10 MG PO TABS
30.0000 mg | ORAL_TABLET | Freq: Two times a day (BID) | ORAL | Status: DC
  Administered 2020-11-17 – 2020-11-19 (×4): 30 mg via ORAL
  Filled 2020-11-17 (×4): qty 3

## 2020-11-17 MED ORDER — GABAPENTIN 800 MG PO TABS
800.0000 mg | ORAL_TABLET | Freq: Three times a day (TID) | ORAL | Status: DC
  Filled 2020-11-17 (×2): qty 1

## 2020-11-17 MED ORDER — LEVOTHYROXINE SODIUM 25 MCG PO TABS
25.0000 ug | ORAL_TABLET | Freq: Every day | ORAL | Status: DC
  Administered 2020-11-18 – 2020-11-19 (×2): 25 ug via ORAL
  Filled 2020-11-17 (×2): qty 1

## 2020-11-17 MED ORDER — IPRATROPIUM-ALBUTEROL 0.5-2.5 (3) MG/3ML IN SOLN
3.0000 mL | Freq: Four times a day (QID) | RESPIRATORY_TRACT | Status: DC
  Administered 2020-11-17 – 2020-11-18 (×2): 3 mL via RESPIRATORY_TRACT
  Filled 2020-11-17 (×2): qty 3

## 2020-11-17 MED ORDER — ARFORMOTEROL TARTRATE 15 MCG/2ML IN NEBU
15.0000 ug | INHALATION_SOLUTION | Freq: Two times a day (BID) | RESPIRATORY_TRACT | Status: DC
  Administered 2020-11-17 – 2020-11-19 (×4): 15 ug via RESPIRATORY_TRACT
  Filled 2020-11-17 (×4): qty 2

## 2020-11-17 MED ORDER — ALBUTEROL (5 MG/ML) CONTINUOUS INHALATION SOLN
10.0000 mg | INHALATION_SOLUTION | RESPIRATORY_TRACT | Status: AC
  Filled 2020-11-17: qty 20

## 2020-11-17 MED ORDER — AMLODIPINE BESYLATE 5 MG PO TABS
5.0000 mg | ORAL_TABLET | Freq: Every day | ORAL | Status: DC
  Administered 2020-11-17 – 2020-11-18 (×2): 5 mg via ORAL
  Filled 2020-11-17 (×2): qty 1

## 2020-11-17 MED ORDER — ALBUTEROL SULFATE HFA 108 (90 BASE) MCG/ACT IN AERS
8.0000 | INHALATION_SPRAY | Freq: Once | RESPIRATORY_TRACT | Status: AC
  Administered 2020-11-17: 8 via RESPIRATORY_TRACT
  Filled 2020-11-17: qty 6.7

## 2020-11-17 MED ORDER — ENOXAPARIN SODIUM 40 MG/0.4ML ~~LOC~~ SOLN
40.0000 mg | Freq: Every day | SUBCUTANEOUS | Status: DC
  Administered 2020-11-17 – 2020-11-19 (×2): 40 mg via SUBCUTANEOUS
  Filled 2020-11-17 (×4): qty 0.4

## 2020-11-17 MED ORDER — PRAMIPEXOLE DIHYDROCHLORIDE 0.25 MG PO TABS
0.5000 mg | ORAL_TABLET | Freq: Every day | ORAL | Status: DC
  Administered 2020-11-17 – 2020-11-18 (×2): 0.5 mg via ORAL
  Filled 2020-11-17 (×3): qty 2

## 2020-11-17 MED ORDER — LORAZEPAM 2 MG/ML IJ SOLN
0.5000 mg | Freq: Once | INTRAMUSCULAR | Status: AC
  Administered 2020-11-17: 0.5 mg via INTRAVENOUS
  Filled 2020-11-17: qty 1

## 2020-11-17 MED ORDER — BUPRENORPHINE HCL 8 MG SL SUBL
8.0000 mg | SUBLINGUAL_TABLET | Freq: Every day | SUBLINGUAL | Status: DC
  Administered 2020-11-17 – 2020-11-19 (×3): 8 mg via SUBLINGUAL
  Filled 2020-11-17 (×3): qty 1

## 2020-11-17 MED ORDER — ACETAMINOPHEN 325 MG PO TABS
650.0000 mg | ORAL_TABLET | Freq: Four times a day (QID) | ORAL | Status: DC | PRN
  Administered 2020-11-17: 650 mg via ORAL
  Filled 2020-11-17 (×3): qty 2

## 2020-11-17 MED ORDER — GABAPENTIN 400 MG PO CAPS
800.0000 mg | ORAL_CAPSULE | Freq: Three times a day (TID) | ORAL | Status: DC
  Administered 2020-11-17 – 2020-11-19 (×6): 800 mg via ORAL
  Filled 2020-11-17 (×6): qty 2

## 2020-11-17 MED ORDER — NICOTINE 21 MG/24HR TD PT24
21.0000 mg | MEDICATED_PATCH | TRANSDERMAL | Status: DC
  Administered 2020-11-17 – 2020-11-18 (×2): 21 mg via TRANSDERMAL
  Filled 2020-11-17 (×2): qty 1

## 2020-11-17 MED ORDER — LOSARTAN POTASSIUM 50 MG PO TABS
100.0000 mg | ORAL_TABLET | Freq: Every day | ORAL | Status: DC
  Administered 2020-11-17 – 2020-11-18 (×2): 100 mg via ORAL
  Filled 2020-11-17 (×2): qty 2

## 2020-11-17 MED ORDER — FUROSEMIDE 10 MG/ML IJ SOLN
40.0000 mg | Freq: Two times a day (BID) | INTRAMUSCULAR | Status: DC
  Administered 2020-11-17 – 2020-11-19 (×4): 40 mg via INTRAVENOUS
  Filled 2020-11-17 (×5): qty 4

## 2020-11-17 MED ORDER — AEROCHAMBER PLUS FLO-VU LARGE MISC: 1.0000 | Freq: Once | Status: AC

## 2020-11-17 MED ORDER — ESCITALOPRAM OXALATE 10 MG PO TABS
20.0000 mg | ORAL_TABLET | Freq: Every morning | ORAL | Status: DC
  Administered 2020-11-18 – 2020-11-19 (×2): 20 mg via ORAL
  Filled 2020-11-17 (×2): qty 2

## 2020-11-17 MED ORDER — METHYLPREDNISOLONE SODIUM SUCC 125 MG IJ SOLR
60.0000 mg | Freq: Three times a day (TID) | INTRAMUSCULAR | Status: DC
  Administered 2020-11-17 – 2020-11-18 (×2): 60 mg via INTRAVENOUS
  Filled 2020-11-17 (×2): qty 2

## 2020-11-17 MED ORDER — HYDROXYZINE HCL 25 MG PO TABS
25.0000 mg | ORAL_TABLET | Freq: Four times a day (QID) | ORAL | Status: DC | PRN
  Administered 2020-11-18: 25 mg via ORAL
  Filled 2020-11-17: qty 1

## 2020-11-17 NOTE — ED Notes (Signed)
Pharmacy notified about infiltration. States that if urine output does not increase in an hour then re-dose the lasix.

## 2020-11-17 NOTE — ED Provider Notes (Signed)
[facility] EMERGENCY DEPARTMENT Provider Note   CSN: [id number] Arrival date & time: 11/17/20  8032     History Chief Complaint  Patient presents with  . Shortness of Breath    [name] is a 64 y.o. female.  HPI   This patient is an ill-appearing 64 year old female with a known history of congestive heart failure, she has a history of COPD chronic respiratory failure with hypoxia and hypercapnia according to the medical record, she reports that she has had diabetes but is diet controlled, hypertension and hyperlipidemia.  The patient reports that over the last several days she has had increased shortness of breath with associated chest discomfort which she describes as a heaviness in the middle of her chest.  She states that heaviness is different than prior episodes of COPD however she has had increasing shortness of breath and cough productive of white sputum.  She has not lost taste or smell, she does not have nausea or vomiting but has had occasional diarrhea.  She is fully vaccinated for COVID-19 with her last dose in May.  She denies any increase swelling of her legs, she has chronic changes of the skin of her legs consistent with a stasis dermatitis, this is not new either.  Paramedics found the patient have oxygen levels of 78% on her home 3 L by nasal cannula, they placed her on a nonrebreather at high flow with improvement to 95%.  They report that her lung sounds were clear so they gave no other medications prehospital other than some baby aspirin.  Past Medical History:  Diagnosis Date  . (HFpEF) heart failure with preserved ejection fraction ([place])   . Anxiety   . Arthritis   . Chronic respiratory failure with hypoxia and hypercapnia (HCC)   . COPD (chronic obstructive pulmonary disease) (HCC)    no inhalers--smoker, no oxygen  . Depression   . Diabetes mellitus    no meds-diet controlled  . GERD (gastroesophageal reflux disease)   . Hyperlipidemia    . Hypertension   . Hypothyroid   . Restless leg syndrome   . Seizures ([place])    02/13/2011 -hx of seizure due to "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  no seizure since--pt does take keppra and office note from neurologist dr. Leta Baptist on this chart    Patient Active Problem List   Diagnosis Date Noted  . Acute on chronic respiratory failure with hypoxia ([place]) 11/17/2020  . Leg pain 02/05/2020  . CHF exacerbation ([place]) 12/28/2019  . Elevated troponin 12/28/2019  . Myofascial pain syndrome 08/23/2018  . Lumbar spondylosis 08/23/2018  . Leg swelling 07/31/2018  . SOB (shortness of breath) 07/31/2018  . Tobacco abuse 07/31/2018  . Fusion of lumbar spine (L3-S1) 07/20/2018  . 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 Lumbar degenerative disc disease 07/20/2018  . Drug overdose 04/18/2014  . Chronic pain syndrome 04/18/2014  . 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 Respiratory failure ([place]) 04/14/2014  . Acute respiratory failure ([place]) 04/14/2014  . UTI (urinary tract infection) 04/28/2013  . Acute renal failure (ARF) ([place]) 04/28/2013  . Weakness generalized 04/28/2013  . Sepsis ([place]) 04/28/2013  . Chronic diastolic CHF (congestive heart failure) ([place]) 04/28/2013  . Hypokalemia 01/11/2013  . Sinus tachycardia 01/11/2013  . Acute respiratory failure with hypoxia ([place]) 01/10/2013  . COPD exacerbation ([place])  01/10/2013  . Community acquired pneumonia 01/10/2013  . Diabetes mellitus, type 2 ([place]) 01/10/2013  . Diastolic CHF, acute on chronic (HCC) 01/10/2013  . Osteoarthritis of left knee 10/27/2011  . Hypoxemia 03/29/2011  . Acute renal failure ([place]) 03/29/2011  . COPD (chronic obstructive pulmonary disease) ([place]) 03/29/2011  . HTN (hypertension) 03/29/2011  . Hypotension 03/25/2011    Past Surgical History:  Procedure  Laterality Date  . CARPAL TUNNEL RELEASE     left  . JOINT REPLACEMENT  08/26/2011    right total knee arthroplasty  . KNEE ARTHROPLASTY  10/28/2011   Procedure: COMPUTER ASSISTED TOTAL KNEE ARTHROPLASTY;  Surgeon: Karen Chafe Rendall III;  Location: [facility];  Service: Orthopedics;  Laterality: Left;  preop femoral nerve block  . KNEE SURGERY     right  . laminectomy and diskectomy     L4-5 with fusion     OB History   No obstetric history on file.     Family History  Problem Relation Age of Onset  . Heart attack Father 32  . Alzheimer's disease Mother   . Breast cancer Neg Hx     Social History   Tobacco Use  . Smoking status: Current Every Day Smoker    Packs/day: 1.00    Years: 48.00    Pack years: 48.00    Types: Cigarettes  . Smokeless tobacco: Never Used  Vaping Use  . Vaping Use: Former  . Substances: Nicotine  Substance Use Topics  . Alcohol use: Not Currently  . Drug use: No    Home Medications Prior to Admission medications   Medication Sig Start Date End Date Taking? Authorizing Provider  albuterol (VENTOLIN HFA) 108 (90 Base) MCG/ACT inhaler Inhale 2 puffs into the lungs every 4 (four) hours as needed for wheezing or shortness of breath. 01/16/20   Julian Hy, DO  amLODipine (NORVASC) 5 MG tablet Take 5 mg by mouth daily.  07/04/17   [provider]  ARIPiprazole (ABILIFY) 10 MG tablet Take 10 mg by mouth daily.  07/28/17   [provider]  aspirin (ASPIRIN 81) 81 MG EC tablet Take 81 mg by mouth daily.     [provider]  busPIRone (BUSPAR) 30 MG tablet Take 30 mg by mouth 2 (two) times daily.    [provider]  cephALEXin (KEFLEX) 500 MG capsule Take 500 mg by mouth every 6 (six) hours. Start date 08/30/20 08/30/20   [provider]  doxycycline (VIBRAMYCIN) 100 MG capsule Take 1 capsule (100 mg total) by mouth 2 (two) times daily. One po bid x 7 days 09/03/20   Etta Quill, NP  escitalopram (LEXAPRO) 20 MG  tablet Take 20 mg by mouth every morning.     [provider]  fenofibrate micronized (LOFIBRA) 134 MG capsule Take 134 mg by mouth daily before breakfast.     [provider]  fluticasone (FLOVENT HFA) 110 MCG/ACT inhaler Inhale 2 puffs into the lungs 2 (two) times daily. Patient not taking: Reported on 09/03/2020 01/02/20 09/03/20  Mercy Riding, MD  fluticasone (FLOVENT HFA) 110 MCG/ACT inhaler Inhale 1 puff into the lungs 2 (two) times daily. Patient not taking: Reported on 09/03/2020 01/16/20   Noemi Chapel P, DO  furosemide (LASIX) 40 MG tablet Take 1 tablet (40 mg total) by mouth daily. May take additional 40 mg (one tablet) as needed for swelling, weight gain or sob Patient taking differently: Take 40 mg by mouth 2 (two) times daily.  01/03/20  12/28/20  Mercy Riding, MD  gabapentin (NEURONTIN) 800 MG tablet Take 800 mg by mouth 3 (three) times daily.  06/23/17   [provider]  hydrOXYzine (ATARAX/VISTARIL) 25 MG tablet Take 25 mg by mouth every 6 (six) hours as needed for anxiety or itching.  07/28/17   [provider]  levETIRAcetam (KEPPRA) 500 MG tablet Take 1 tablet (500 mg total) by mouth 2 (two) times daily. Patient not taking: Reported on 09/03/2020 04/18/14   Kelvin Cellar, MD  levothyroxine (SYNTHROID, LEVOTHROID) 25 MCG tablet Take 25 mcg by mouth daily before breakfast.  06/09/17   [provider]  losartan (COZAAR) 100 MG tablet Take 100 mg by mouth daily.  07/20/17   [provider]  nicotine (NICODERM CQ - DOSED IN MG/24 HOURS) 21 mg/24hr patch Place 1 patch (21 mg total) onto the skin daily. Patient not taking: Reported on 02/05/2020 01/02/20 01/01/21  Mercy Riding, MD  pantoprazole (PROTONIX) 40 MG tablet Take 40 mg by mouth every morning.  08/16/13   [provider]  pramipexole (MIRAPEX) 0.5 MG tablet Take 0.5 mg by mouth at bedtime. 08/08/20   [provider]  SUBOXONE 8-2 MG FILM Place 1 each under the tongue  daily.  08/27/20   [provider]  SYMBICORT 160-4.5 MCG/ACT inhaler Inhale 2 puffs into the lungs daily.  08/08/20   [provider]  umeclidinium-vilanterol (ANORO ELLIPTA) 62.5-25 MCG/INH AEPB Inhale 1 puff into the lungs daily. Patient not taking: Reported on 09/03/2020 01/02/20   Mercy Riding, MD  umeclidinium-vilanterol (ANORO ELLIPTA) 62.5-25 MCG/INH AEPB Inhale 1 puff into the lungs daily. 01/16/20   Julian Hy, DO  varenicline (CHANTIX CONTINUING MONTH PAK) 1 MG tablet Take 1 tablet (1 mg total) by mouth 2 (two) times daily. Patient not taking: Reported on 02/05/2020 01/16/20   Noemi Chapel P, DO  varenicline (CHANTIX) 0.5 MG tablet Take 1 tablet (0.5 mg total) by mouth 2 (two) times daily. Patient not taking: Reported on 02/05/2020 01/16/20   Noemi Chapel P, DO    Allergies    Acetaminophen, Aspirin, Nsaids, and Vicodin [hydrocodone-acetaminophen]  Review of Systems   Review of Systems  All other systems reviewed and are negative.   Physical Exam Updated Vital Signs BP (!) 155/64   Pulse 73   Temp 98.3 F (36.8 C) (Oral)   Resp (!) 25   SpO2 91%   Physical Exam Vitals and nursing note reviewed.  Constitutional:      General: She is in acute distress.     Appearance: She is well-developed.  HENT:     Head: Normocephalic and atraumatic.     Mouth/Throat:     Pharynx: No oropharyngeal exudate.  Eyes:     General: No scleral icterus.       Right eye: No discharge.        Left eye: No discharge.     Conjunctiva/sclera: Conjunctivae normal.     Pupils: Pupils are equal, round, and reactive to light.  Neck:     Thyroid: No thyromegaly.     Vascular: No JVD.  Cardiovascular:     Rate and Rhythm: Regular rhythm. Tachycardia present.     Heart sounds: Normal heart sounds. No murmur heard.  No friction rub. No gallop.   Pulmonary:     Effort: Respiratory distress present.     Breath sounds: Wheezing present. No rales.     Comments: Decreased lung sounds  bilaterally, prolonged expiratory phase, increased  work of breathing, speaks in shortened sentences, mild accessory muscle use Abdominal:     General: Bowel sounds are normal. There is no distension.     Palpations: Abdomen is soft. There is no mass.     Tenderness: There is no abdominal tenderness.  Musculoskeletal:        General: No tenderness. Normal range of motion.     Cervical back: Normal range of motion and neck supple.  Lymphadenopathy:     Cervical: No cervical adenopathy.  Skin:    General: Skin is warm and dry.     Findings: No erythema or rash.     Comments: Stasis dermatitis of the bilateral lower extremities symmetrical  Neurological:     Mental Status: She is alert.     Coordination: Coordination normal.  Psychiatric:        Behavior: Behavior normal.     ED Results / Procedures / Treatments   Labs (all labs ordered are listed, but only abnormal results are displayed) Labs Reviewed  CBC WITH DIFFERENTIAL/PLATELET - Abnormal; Notable for the following components:      Result Value   RBC 5.63 (*)    Hemoglobin 16.5 (*)    HCT 51.3 (*)    RDW 19.0 (*)    Platelets 147 (*)    Abs Immature Granulocytes 0.10 (*)    All other components within normal limits  COMPREHENSIVE METABOLIC PANEL - Abnormal; Notable for the following components:   BUN 7 (*)    Total Protein 6.3 (*)    Albumin 2.8 (*)    AST 53 (*)    Alkaline Phosphatase 18 (*)    Total Bilirubin 2.6 (*)    All other components within normal limits  BRAIN NATRIURETIC PEPTIDE - Abnormal; Notable for the following components:   B Natriuretic Peptide 712.6 (*)    All other components within normal limits  RESP PANEL BY RT-PCR (FLU A&B, COVID) ARPGX2  TROPONIN I (HIGH SENSITIVITY)    EKG EKG Interpretation  Date/Time:  Monday November 17 2020 08:21:21 EST Ventricular Rate:  76 PR Interval:    QRS Duration: 100 QT Interval:  423 QTC Calculation: 476 R Axis:   84 Text Interpretation: Sinus  rhythm Borderline right axis deviation Confirmed by Noemi Chapel [phone]) on 11/17/2020 9:19:57 AM   Radiology DG Chest Port 1 View  Result Date: 11/17/2020 CLINICAL DATA:  Cough, COPD EXAM: PORTABLE CHEST 1 VIEW COMPARISON:  01/01/2020 FINDINGS: Persistent hilar and interstitial prominence. Right costophrenic angle is partially excluded. Possible trace left pleural effusion. No pneumothorax. Similar cardiomediastinal contours. IMPRESSION: Persistent hilar and interstitial prominence and possible trace left pleural effusion. Favored to reflect mild edema superimposed on chronic changes. Atypical pneumonia is an additional consideration. Electronically Signed   By: Macy Mis M.D.   On: 11/17/2020 08:44    Procedures .Critical Care Performed by: Noemi Chapel, MD Authorized by: Noemi Chapel, MD   Critical care provider statement:    Critical care time (minutes):  35   Critical care time was exclusive of:  Separately billable procedures and treating other patients and teaching time   Critical care was necessary to treat or prevent imminent or life-threatening deterioration of the following conditions:  Respiratory failure   Critical care was time spent personally by me on the following activities:  Blood draw for specimens, development of treatment plan with patient or surrogate, discussions with consultants, evaluation of patient's response to treatment, examination of patient, obtaining history from patient or surrogate, ordering  and performing treatments and interventions, ordering and review of laboratory studies, ordering and review of radiographic studies, pulse oximetry, re-evaluation of patient's condition and review of old charts   (including critical care time)  Medications Ordered in ED Medications  albuterol (PROVENTIL,VENTOLIN) solution continuous neb (10 mg Nebulization Not Given 11/17/20 0900)  cefTRIAXone (ROCEPHIN) 1 g in sodium chloride 0.9 % 100 mL IVPB (has no  administration in time range)  azithromycin (ZITHROMAX) 500 mg in sodium chloride 0.9 % 250 mL IVPB (has no administration in time range)  albuterol (PROVENTIL,VENTOLIN) solution continuous neb (10 mg Nebulization New Bag/Given 11/17/20 1235)  methylPREDNISolone sodium succinate (SOLU-MEDROL) 125 mg/2 mL injection 125 mg (125 mg Intravenous Given 11/17/20 0837)  albuterol (VENTOLIN HFA) 108 (90 Base) MCG/ACT inhaler 8 puff (8 puffs Inhalation Given 11/17/20 0826)  AeroChamber Plus Flo-Vu Large MISC 1 each (1 each Other Given 11/17/20 0826)  magnesium sulfate IVPB 2 g 50 mL (0 g Intravenous Stopped 11/17/20 1124)    ED Course  I have reviewed the triage vital signs and the nursing notes.  Pertinent labs & imaging results that were available during my care of the patient were reviewed by me and considered in my medical decision making (see chart for details).    MDM Rules/Calculators/A&P                           Acute respiratory distress likely COPD  Would also consider congestive heart failure, underlying pneumonia or COVID-19  Check electrolytes chest x-ray and EKG  Rule out cardiac causes with troponin and EKG, cardiac monitoring  Added albuterol, Solu-Medrol, patient has been given full dose of aspirin prehospital  This patient appears critically ill in respiratory distress requiring increased levels of oxygen   This patient was reexamined, continues to require oxygen, continues to be tachypneic, chest x-ray shows possible infiltrate, antibiotics given.  Discussed with the hospitalist who will admit the patient to the hospital.  Acute respiratory failure negative for Covid.  Final Clinical Impression(s) / ED Diagnoses Final diagnoses:  Acute respiratory failure with hypoxia ([place])  COPD exacerbation (HCC)     Noemi Chapel, MD 11/17/20 1258

## 2020-11-17 NOTE — ED Notes (Signed)
This RN called [facility], COVID test has shown as "in process" since [phone], person that answered asked for this RNs number. Lab to call back.

## 2020-11-17 NOTE — ED Triage Notes (Addendum)
Per [facility]: [name] pt started with sudden onset of centralized non radiating dull chest pain 3/10. Pt has COPD and has had increased coughing with thick white sputum. Pt wears 3 L [place] at all times at home, EMS found pt to be 79% on this and placed pt on 10 L NRB, increased up to 94%. Pt is not vaccinated against COVID 19. Pt arrives with diminished bilateral breath sounds and increased work of breathing. Pt is able to speak in full sentences.   Pt states that she checks her pulse ox at home and that she normally runs "78-79%".

## 2020-11-17 NOTE — Progress Notes (Signed)
Pt came up from the ED moaning , pt restless stating multiple times "help im dying" and "somebody please helping". Pt is hitting side rails and rocking back and forth. BP (!) 161/82 (BP Location: Left Arm)   Pulse 80   Temp 98 F (36.7 C) (Oral)   Resp (!) 21   Ht 5\' 6"  (1.676 m)   Wt 91.6 kg   SpO2 98%   BMI 32.59 kg/m  MD notified  Medications given  Will continue to monitor

## 2020-11-17 NOTE — Progress Notes (Signed)
PT Cancellation Note  Patient Details Name: [name] MRN: [id number] DOB: July 23, 1956   Cancelled Treatment:    Reason Eval/Treat Not Completed: Medical issues which prohibited therapy RN requesting to hold PT. Will follow up as schedule allows.   Reuel Derby, PT, DPT  Acute Rehabilitation Services  Pager: [phone] Office: [phone]    Rudean Hitt 11/17/2020, 5:10 PM

## 2020-11-17 NOTE — ED Notes (Signed)
[facility] called back, COVID test was not run earlier. States that it should be finished by [employer]. Dr. Sabra Heck aware of delay in pt care.

## 2020-11-17 NOTE — H&P (Signed)
History and Physical    [name] TDV:[id number] DOB: Oct 30, 1956 DOA: 11/17/2020  Referring MD/NP/PA: Noemi Chapel, MD PCP: Madison Hickman, FNP  Patient coming from: Home via EMS  Chief Complaint: Could not breathe  I have personally briefly reviewed patient's old medical records in [place]   HPI: [name] is a 64 y.o. female with medical history significant of COPD, chronic respiratory failure on 2L, diastolic CHF, hypertension, hyperlipidemia, hypothyroidism, diet-controlled diabetes mellitus type 2, PVD, and cellulitis who presents with complaints of progressively worsening shortness of breath since yesterday.  She has had a productive cough with whitish sputum production and wheezing.  Complains of having chest tightness that she describes since 3 out of 10 on the pain scale. Associated symptoms include bilateral leg swelling and redness.  Denies having any significant fever, chills, change in weight, orthopnea, nausea, vomiting, or diarrhea.  She is supposed to use her oxygen 24/7, but does not always use it 24/7.  She still continues to smoke at least 1 pack of cigarettes per day on average, but states that she wants to quit at this time.  Patient reports that she just recently had a sleep study at [facility], but has received the results yet.   ED Course: Upon admission into the emergency department patient was seen to be afebrile, pulse 13-28,  O2 saturations as low as 87% with improvement to 91% on 5 L of nasal cannula oxygen, and all other vital signs stable.  Labs significant for hemoglobin 16.5, platelets 147, albumin 2.8, AST 53, ALT 30, troponin 13, and BNP 712.6.  Chest x-ray noted persistent hilar and interstitial prominence and possible trace left pleural effusion. Influenza and COVID-19 screening were negative.  Patient had been given multiple breathing treatments, Solu-Medrol 125 mg IV, magnesium sulfate 2 g IV, Rocephin, and azithromycin.  TRH called to admit  for suspected COPD exacerbation  Review of Systems  Constitutional: Positive for malaise/fatigue. Negative for fever.  HENT: Negative for ear discharge and nosebleeds.   Eyes: Negative for photophobia and pain.  Respiratory: Positive for cough, sputum production, shortness of breath and wheezing.   Cardiovascular: Positive for leg swelling.       Positive for chest tightness  Gastrointestinal: Negative for abdominal pain, nausea and vomiting.  Genitourinary: Negative for dysuria and hematuria.  Musculoskeletal: Positive for back pain and myalgias. Negative for falls.  Skin: Negative for itching.  Neurological: Negative for focal weakness and loss of consciousness.  Psychiatric/Behavioral: Positive for substance abuse.    Past Medical History:  Diagnosis Date  . (HFpEF) heart failure with preserved ejection fraction ([place])   . Anxiety   . Arthritis   . Chronic respiratory failure with hypoxia and hypercapnia (HCC)   . COPD (chronic obstructive pulmonary disease) (HCC)    no inhalers--smoker, no oxygen  . Depression   . Diabetes mellitus    no meds-diet controlled  . GERD (gastroesophageal reflux disease)   . Hyperlipidemia   . Hypertension   . Hypothyroid   . Restless leg syndrome   . Seizures ([place])    02/13/2011 -hx of seizure due to "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  no seizure since--pt does take keppra and office note from neurologist dr. Leta Baptist on this chart    Past Surgical History:  Procedure Laterality Date  . CARPAL TUNNEL RELEASE     left  .  JOINT REPLACEMENT  08/26/2011    right total knee arthroplasty  . KNEE ARTHROPLASTY  10/28/2011   Procedure: COMPUTER ASSISTED TOTAL KNEE ARTHROPLASTY;  Surgeon: Karen Chafe Rendall III;  Location: [facility];  Service: Orthopedics;  Laterality: Left;  preop femoral nerve block  . KNEE SURGERY     right  . laminectomy and  diskectomy     L4-5 with fusion     reports that she has been smoking cigarettes. She has a 48.00 pack-year smoking history. She has never used smokeless tobacco. She reports previous alcohol use. She reports that she does not use drugs.  Allergies  Allergen Reactions  . Acetaminophen Other (See Comments)    Kidney issues  . Aspirin Other (See Comments)    Dr. Michela Pitcher do not take due to kidneys  . Nsaids Other (See Comments)    Pt states it messes up her kidneys  . Vicodin [Hydrocodone-Acetaminophen] Nausea And Vomiting    Family History  Problem Relation Age of Onset  . Heart attack Father 21  . Alzheimer's disease Mother   . Breast cancer Neg Hx     Prior to Admission medications   Medication Sig Start Date End Date Taking? Authorizing Provider  albuterol (VENTOLIN HFA) 108 (90 Base) MCG/ACT inhaler Inhale 2 puffs into the lungs every 4 (four) hours as needed for wheezing or shortness of breath. 01/16/20   Julian Hy, DO  amLODipine (NORVASC) 5 MG tablet Take 5 mg by mouth daily.  07/04/17   [provider]  ARIPiprazole (ABILIFY) 10 MG tablet Take 10 mg by mouth daily.  07/28/17   [provider]  aspirin (ASPIRIN 81) 81 MG EC tablet Take 81 mg by mouth daily.     [provider]  busPIRone (BUSPAR) 30 MG tablet Take 30 mg by mouth 2 (two) times daily.    [provider]  cephALEXin (KEFLEX) 500 MG capsule Take 500 mg by mouth every 6 (six) hours. Start date 08/30/20 08/30/20   [provider]  doxycycline (VIBRAMYCIN) 100 MG capsule Take 1 capsule (100 mg total) by mouth 2 (two) times daily. One po bid x 7 days 09/03/20   Etta Quill, NP  escitalopram (LEXAPRO) 20 MG tablet Take 20 mg by mouth every morning.     [provider]  fenofibrate micronized (LOFIBRA) 134 MG capsule Take 134 mg by mouth daily before breakfast.     [provider]  fluticasone (FLOVENT HFA) 110 MCG/ACT inhaler Inhale 2 puffs into the lungs 2  (two) times daily. Patient not taking: Reported on 09/03/2020 01/02/20 09/03/20  Mercy Riding, MD  fluticasone (FLOVENT HFA) 110 MCG/ACT inhaler Inhale 1 puff into the lungs 2 (two) times daily. Patient not taking: Reported on 09/03/2020 01/16/20   Noemi Chapel P, DO  furosemide (LASIX) 40 MG tablet Take 1 tablet (40 mg total) by mouth daily. May take additional 40 mg (one tablet) as needed for swelling, weight gain or sob Patient taking differently: Take 40 mg by mouth 2 (two) times daily.  01/03/20 12/28/20  Mercy Riding, MD  gabapentin (NEURONTIN) 800 MG tablet Take 800 mg by mouth 3 (three) times daily.  06/23/17   [provider]  hydrOXYzine (ATARAX/VISTARIL) 25 MG tablet Take 25 mg by mouth every 6 (six) hours as needed for anxiety or itching.  07/28/17   [provider]  levETIRAcetam (KEPPRA) 500 MG tablet Take 1 tablet (500 mg total) by mouth 2 (two) times daily. Patient not  taking: Reported on 09/03/2020 04/18/14   Kelvin Cellar, MD  levothyroxine (SYNTHROID, LEVOTHROID) 25 MCG tablet Take 25 mcg by mouth daily before breakfast.  06/09/17   [provider]  losartan (COZAAR) 100 MG tablet Take 100 mg by mouth daily.  07/20/17   [provider]  nicotine (NICODERM CQ - DOSED IN MG/24 HOURS) 21 mg/24hr patch Place 1 patch (21 mg total) onto the skin daily. Patient not taking: Reported on 02/05/2020 01/02/20 01/01/21  Mercy Riding, MD  pantoprazole (PROTONIX) 40 MG tablet Take 40 mg by mouth every morning.  08/16/13   [provider]  pramipexole (MIRAPEX) 0.5 MG tablet Take 0.5 mg by mouth at bedtime. 08/08/20   [provider]  SUBOXONE 8-2 MG FILM Place 1 each under the tongue daily.  08/27/20   [provider]  SYMBICORT 160-4.5 MCG/ACT inhaler Inhale 2 puffs into the lungs daily.  08/08/20   [provider]  umeclidinium-vilanterol (ANORO ELLIPTA) 62.5-25 MCG/INH AEPB Inhale 1 puff into the lungs daily. Patient not taking: Reported  on 09/03/2020 01/02/20   Mercy Riding, MD  umeclidinium-vilanterol (ANORO ELLIPTA) 62.5-25 MCG/INH AEPB Inhale 1 puff into the lungs daily. 01/16/20   Julian Hy, DO  varenicline (CHANTIX CONTINUING MONTH PAK) 1 MG tablet Take 1 tablet (1 mg total) by mouth 2 (two) times daily. Patient not taking: Reported on 02/05/2020 01/16/20   Noemi Chapel P, DO  varenicline (CHANTIX) 0.5 MG tablet Take 1 tablet (0.5 mg total) by mouth 2 (two) times daily. Patient not taking: Reported on 02/05/2020 01/16/20   Julian Hy, DO    Physical Exam:  Constitutional: Elderly appearing female who appears to be in some respiratory distress Vitals:   11/17/20 1100 11/17/20 1130 11/17/20 1145 11/17/20 1235  BP: (!) 147/68 (!) 149/65 (!) 155/64   Pulse: 78 74 73   Resp: (!) 26 20 (!) 25   Temp:      TempSrc:      SpO2: 92% 90% 90% 91%   Eyes: PERRL, lids and conjunctivae normal ENMT: Mucous membranes are moist. Posterior pharynx clear of any exudate or lesions.  Neck: normal, supple, no masses, no thyromegaly Respiratory: Mildly tachypneic with decreased aeration appreciated. Patient currently on 5 L nasal cannula oxygen..  Cardiovascular: Regular rate and rhythm, no murmurs / rubs / gallops. No extremity edema. 2+ pedal pulses. No carotid bruits.  Abdomen: no tenderness, no masses palpated. No hepatosplenomegaly. Bowel sounds positive.  Musculoskeletal: no clubbing / cyanosis. No joint deformity upper and lower extremities. Good ROM, no contractures. Normal muscle tone.  Skin: no rashes, lesions, ulcers. No induration Neurologic: CN 2-12 grossly intact. Sensation intact, DTR normal. Strength 5/5 in all 4.  Psychiatric: Normal judgment and insight. Alert and oriented x 3. Normal mood.     Labs on Admission: I have personally reviewed following labs and imaging studies  CBC: Recent Labs  Lab 11/17/20 0833  WBC 7.9  NEUTROABS 6.3  HGB 16.5*  HCT 51.3*  MCV 91.1  PLT 063*   Basic Metabolic  Panel: Recent Labs  Lab 11/17/20 0833  NA 139  K 3.7  CL 98  CO2 30  GLUCOSE 84  BUN 7*  CREATININE 0.95  CALCIUM 9.2   GFR: CrCl cannot be calculated (Unknown ideal weight.). Liver Function Tests: Recent Labs  Lab 11/17/20 0833  AST 53*  ALT 30  ALKPHOS 18*  BILITOT 2.6*  PROT 6.3*  ALBUMIN 2.8*   No results for input(s):  LIPASE, AMYLASE in the last 168 hours. No results for input(s): AMMONIA in the last 168 hours. Coagulation Profile: No results for input(s): INR, PROTIME in the last 168 hours. Cardiac Enzymes: No results for input(s): CKTOTAL, CKMB, CKMBINDEX, TROPONINI in the last 168 hours. BNP (last 3 results) No results for input(s): PROBNP in the last 8760 hours. HbA1C: No results for input(s): HGBA1C in the last 72 hours. CBG: No results for input(s): GLUCAP in the last 168 hours. Lipid Profile: No results for input(s): CHOL, HDL, LDLCALC, TRIG, CHOLHDL, LDLDIRECT in the last 72 hours. Thyroid Function Tests: No results for input(s): TSH, T4TOTAL, FREET4, T3FREE, THYROIDAB in the last 72 hours. Anemia Panel: No results for input(s): VITAMINB12, FOLATE, FERRITIN, TIBC, IRON, RETICCTPCT in the last 72 hours. Urine analysis:    Component Value Date/Time   COLORURINE YELLOW 09/03/2020 1704   APPEARANCEUR CLEAR 09/03/2020 1704   LABSPEC 1.005 09/03/2020 1704   PHURINE 5.0 09/03/2020 1704   GLUCOSEU NEGATIVE 09/03/2020 1704   HGBUR NEGATIVE 09/03/2020 1704   BILIRUBINUR NEGATIVE 09/03/2020 1704   KETONESUR NEGATIVE 09/03/2020 1704   PROTEINUR NEGATIVE 09/03/2020 1704   UROBILINOGEN 0.2 04/14/2014 1217   NITRITE NEGATIVE 09/03/2020 1704   LEUKOCYTESUR TRACE (A) 09/03/2020 1704   Sepsis Labs: Recent Results (from the past 240 hour(s))  Resp Panel by RT-PCR (Flu A&B, Covid) Nasopharyngeal Swab     Status: None   Collection Time: 11/17/20  8:33 AM   Specimen: Nasopharyngeal Swab; Nasopharyngeal(NP) swabs in vial transport medium  Result Value Ref Range  Status   SARS Coronavirus 2 by RT PCR NEGATIVE NEGATIVE Final    Comment: (NOTE) SARS-CoV-2 target nucleic acids are NOT DETECTED.  The SARS-CoV-2 RNA is generally detectable in upper respiratory specimens during the acute phase of infection. The lowest concentration of SARS-CoV-2 viral copies this assay can detect is 138 copies/mL. A negative result does not preclude SARS-Cov-2 infection and should not be used as the sole basis for treatment or other patient management decisions. A negative result may occur with  improper specimen collection/handling, submission of specimen other than nasopharyngeal swab, presence of viral mutation(s) within the areas targeted by this assay, and inadequate number of viral copies(<138 copies/mL). A negative result must be combined with clinical observations, patient history, and epidemiological information. The expected result is Negative.  Fact Sheet for Patients:  EntrepreneurPulse.com.au  Fact Sheet for Healthcare Providers:  IncredibleEmployment.be  This test is no t yet approved or cleared by the [place] FDA and  has been authorized for detection and/or diagnosis of SARS-CoV-2 by FDA under an Emergency Use Authorization (EUA). This EUA will remain  in effect (meaning this test can be used) for the duration of the COVID-19 declaration under Section 564(b)(1) of the Act, 21 U.S.C.section 360bbb-3(b)(1), unless the authorization is terminated  or revoked sooner.       Influenza A by PCR NEGATIVE NEGATIVE Final   Influenza B by PCR NEGATIVE NEGATIVE Final    Comment: (NOTE) The Xpert Xpress SARS-CoV-2/FLU/RSV plus assay is intended as an aid in the diagnosis of influenza from Nasopharyngeal swab specimens and should not be used as a sole basis for treatment. Nasal washings and aspirates are unacceptable for Xpert Xpress SARS-CoV-2/FLU/RSV testing.  Fact Sheet for  Patients: EntrepreneurPulse.com.au  Fact Sheet for Healthcare Providers: IncredibleEmployment.be  This test is not yet approved or cleared by the [place] FDA and has been authorized for detection and/or diagnosis of SARS-CoV-2 by FDA under an Emergency Use Authorization (EUA).  This EUA will remain in effect (meaning this test can be used) for the duration of the COVID-19 declaration under Section 564(b)(1) of the Act, 21 U.S.C. section 360bbb-3(b)(1), unless the authorization is terminated or revoked.  Performed at [facility], [place] [address], [place], [place] [address]      Radiological Exams on Admission: DG Chest Port 1 View  Result Date: 11/17/2020 CLINICAL DATA:  Cough, COPD EXAM: PORTABLE CHEST 1 VIEW COMPARISON:  01/01/2020 FINDINGS: Persistent hilar and interstitial prominence. Right costophrenic angle is partially excluded. Possible trace left pleural effusion. No pneumothorax. Similar cardiomediastinal contours. IMPRESSION: Persistent hilar and interstitial prominence and possible trace left pleural effusion. Favored to reflect mild edema superimposed on chronic changes. Atypical pneumonia is an additional consideration. Electronically Signed   By: Macy Mis M.D.   On: 11/17/2020 08:44    EKG: Independently reviewed.  Sinus rhythm at 76 bpm with first-degree heart  Assessment/Plan Acute on chronic respiratory failure with hypoxia secondary to COPD exacerbation: At baseline patient wears 2-3 L nasal cannula oxygen at all times, but was found to have O2 saturations as low as 79% by EMS on home 3 L.  Patient has been given multiple breathing treatments, Solu-Medrol 125 mg IV, magnesium sulfate 2 g IV, Rocephin, and azithromycin.  -Admit to medical telemetry -Continuous pulse oximetry with nasal cannula oxygen to maintain O2 saturations greater than 90% -DuoNebs 4 times daily  -Solu-Medrol 60 mg IV  every 8 hours -Pharmacy  substitution of Brovana and budesonide nebs for Symbicort -Continue empiric antibiotics of Rocephin -PT/OT consulted  Diastolic congestive heart failure exacerbation: Acute on chronic.  Chest x-ray noted persistent hilar and interstitial prominence with possible trace left pleural effusion concerning for at least mild pulmonary edema.  BNP 712.6 which is also consistent with acute exacerbation of CHF. -Heart failure orders set  initiated  -Strict I&Os and daily weights -Elevate lower extremities -Lasix 40 mg IV Bid -Reassess in a.m. and adjust diuresis as needed. -Check echocardiogram -Consider formally consulting cardiology if any significant change in heart function noted on echo cardiac  Chest tightness: Patient noted to complain of chest tightness.  Initial troponin within normal limits and EKG without significant ischemic changes. -Follow-up repeat troponin -Check UDS   Chronic pain: Patient on Suboxone 8-2 mg film daily. -Continue pharmacy substitution of Subutex  Essential hypertension: Home medications include losartan 100 mg daily, amlodipine 5 mg daily, and furosemide 40 mg twice daily. -Continue amlodipine and losartan -Lasix IV as noted above  Diabetes mellitus type 2(controlled): Last hemoglobin A1c noted to be 5.7 on 12/29/2019. Patient is diet controlled on any medication. -Continue to monitor blood glucoses and sliding scale of insulin if needed  Hypothyroidism: Home medications include levothyroxine 25 mcg daily. -Add-on TSH -Continue levothyroxine  Thrombocytopenia: Acute on.  Platelet count noted to be 147 on admission, but intermittently in the past noted to be as low as 135. -Continue to monitor  Protein calorie malnutrition: On admission albumin noted to be 2.8. -Check prealbumin in a.m.  Tobacco abuse: Patient reportedly smokes 1 pack cigarettes per day on average.  She states that she wants to quit -Counseled the patient on need of cessation of  tobacco  History of partial seizures: Patient reported not currently taking Keppra  DVT prophylaxis: Lovenox Code Status: Full Family Communication: Declined need to update any family members at this Disposition Plan: Likely discharge home once medically stable Consults called: None Admission status: Inpatient, require more than 2 midnight stay due  to the need of IV diuresis and increased oxygen requirement  Norval Morton MD [facility] Pager [phone]   If 7PM-7AM, please contact night-coverage [email or web address] Password [facility]  11/17/2020, 12:57 PM

## 2020-11-17 NOTE — Progress Notes (Signed)
Patient now on 4L O2 via nasal cannula (from 6L). Will continue to monitor and wean. Patient states she is"supposewd to wear 3L at home". She doesn't consistently wear home O2. She does desat easily with minimal exertion (70s-80s).

## 2020-11-17 NOTE — Plan of Care (Signed)
  Problem: Education: Goal: Knowledge of General Education information will improve Description: Including pain rating scale, medication(s)/side effects and non-pharmacologic comfort measures Outcome: Progressing   Problem: Health Behavior/Discharge Planning: Goal: Ability to manage health-related needs will improve Outcome: Progressing   Problem: Activity: Goal: Risk for activity intolerance will decrease Outcome: Progressing   Problem: Clinical Measurements: Goal: Respiratory complications will improve Outcome: Progressing   Problem: Nutrition: Goal: Adequate nutrition will be maintained Outcome: Progressing

## 2020-11-17 NOTE — Progress Notes (Signed)
Per MD, wait for COVID results to result before administrating continuous albuterol tx.

## 2020-11-18 ENCOUNTER — Inpatient Hospital Stay (HOSPITAL_COMMUNITY): Payer: Medicare Other

## 2020-11-18 ENCOUNTER — Other Ambulatory Visit (HOSPITAL_COMMUNITY): Payer: Medicare Other

## 2020-11-18 DIAGNOSIS — I371 Nonrheumatic pulmonary valve insufficiency: Secondary | ICD-10-CM | POA: Diagnosis not present

## 2020-11-18 DIAGNOSIS — R079 Chest pain, unspecified: Secondary | ICD-10-CM | POA: Diagnosis not present

## 2020-11-18 DIAGNOSIS — I340 Nonrheumatic mitral (valve) insufficiency: Secondary | ICD-10-CM

## 2020-11-18 LAB — PROCALCITONIN: Procalcitonin: <0.1 ng/mL

## 2020-11-18 LAB — GLUCOSE, CAPILLARY: Glucose-Capillary: 135 mg/dL — ABNORMAL HIGH (ref 70–99)

## 2020-11-18 LAB — BASIC METABOLIC PANEL
Anion gap: 9 (ref 5–15)
BUN: 13 mg/dL (ref 8–23)
CO2: 33 mmol/L — ABNORMAL HIGH (ref 22–32)
Calcium: 8.8 mg/dL — ABNORMAL LOW (ref 8.9–10.3)
Chloride: 96 mmol/L — ABNORMAL LOW (ref 98–111)
Creatinine, Ser: 1.04 mg/dL — ABNORMAL HIGH (ref 0.44–1.00)
GFR, Estimated: >60 mL/min (ref >60)
Glucose, Bld: 162 mg/dL — ABNORMAL HIGH (ref 70–99)
Potassium: 3.9 mmol/L (ref 3.5–5.1)
Sodium: 138 mmol/L (ref 135–145)

## 2020-11-18 LAB — CBC
HCT: 47.1 % — ABNORMAL HIGH (ref 36.0–46.0)
Hemoglobin: 15.4 g/dL — ABNORMAL HIGH (ref 12.0–15.0)
MCH: 29.9 pg (ref 26.0–34.0)
MCHC: 32.7 g/dL (ref 30.0–36.0)
MCV: 91.5 fL (ref 80.0–100.0)
Platelets: 172 10*3/uL (ref 150–400)
RBC: 5.15 MIL/uL — ABNORMAL HIGH (ref 3.87–5.11)
RDW: 18.6 % — ABNORMAL HIGH (ref 11.5–15.5)
WBC: 7.5 10*3/uL (ref 4.0–10.5)
nRBC: 0 % (ref 0.0–0.2)

## 2020-11-18 LAB — ECHOCARDIOGRAM COMPLETE
Area-P 1/2: 3.77 cm2
Height: 66 in
S' Lateral: 3.1 cm
Weight: 3188.73 oz

## 2020-11-18 LAB — HEMOGLOBIN A1C
Hgb A1c MFr Bld: 5.2 % (ref 4.8–5.6)
Mean Plasma Glucose: 102.54 mg/dL

## 2020-11-18 LAB — PREALBUMIN: Prealbumin: 10.2 mg/dL — ABNORMAL LOW (ref 18–38)

## 2020-11-18 MED ORDER — INSULIN ASPART 100 UNIT/ML ~~LOC~~ SOLN: 0.0000 [IU] | Freq: Three times a day (TID) | SUBCUTANEOUS | Status: DC

## 2020-11-18 MED ORDER — PROSOURCE PLUS PO LIQD
30.0000 mL | Freq: Two times a day (BID) | ORAL | Status: DC
  Administered 2020-11-18 – 2020-11-19 (×3): 30 mL via ORAL
  Filled 2020-11-18 (×3): qty 30

## 2020-11-18 MED ORDER — SODIUM CHLORIDE 0.9 % IV SOLN: INTRAVENOUS | Status: DC | PRN

## 2020-11-18 MED ORDER — AZITHROMYCIN 500 MG PO TABS
500.0000 mg | ORAL_TABLET | Freq: Every day | ORAL | Status: DC
  Administered 2020-11-18 – 2020-11-19 (×2): 500 mg via ORAL
  Filled 2020-11-18 (×2): qty 1

## 2020-11-18 MED ORDER — INSULIN ASPART 100 UNIT/ML ~~LOC~~ SOLN: 0.0000 [IU] | Freq: Every day | SUBCUTANEOUS | Status: DC

## 2020-11-18 MED ORDER — ADULT MULTIVITAMIN W/MINERALS CH
1.0000 | ORAL_TABLET | Freq: Every day | ORAL | Status: DC
  Administered 2020-11-18 – 2020-11-19 (×2): 1 via ORAL
  Filled 2020-11-18 (×2): qty 1

## 2020-11-18 MED ORDER — IPRATROPIUM-ALBUTEROL 0.5-2.5 (3) MG/3ML IN SOLN: 3.0000 mL | RESPIRATORY_TRACT | Status: DC | PRN

## 2020-11-18 MED ORDER — METHYLPREDNISOLONE SODIUM SUCC 125 MG IJ SOLR
60.0000 mg | Freq: Every day | INTRAMUSCULAR | Status: DC
  Administered 2020-11-19: 60 mg via INTRAVENOUS
  Filled 2020-11-18: qty 2

## 2020-11-18 NOTE — Evaluation (Signed)
Occupational Therapy Evaluation Patient Details Name: [name] MRN: [id number] DOB: [birth year] Today's Date: 11/18/2020    History of Present Illness   64 yo female presenting with shortness of breath. COVID test negative. PMH including COPD, chronic respiratory failure on 2L, diastolic CHF, hypertension, hyperlipidemia, hypothyroidism, diet-controlled diabetes mellitus type 2, PVD, and cellulitis.   Clinical Impression   PTA, pt was living with her niece and was independent; reports that recently she was staying at her sister's home to assist her sister after a fall though recently she wasn't able to help as much due to fatigue. Pt currently performing ADLs and functional mobility at [facility] A level. Upon arrival, SpO2 85% on 5L with SpO2 monitor on finger. Placed on 6L and SpO2 flucuating between 95--84%. Switching O2 monitor to earlobe, SpO2 reading 96% on 6L with good pleth. Pt performing grooming at sink with Supervision and SpO2 maintaining in 90s on 4L. HR 70-80. Pt would benefit from further acute OT to facilitate safe dc. Recommend dc to home once medically stable per physician.     Follow Up Recommendations  No OT follow up;Supervision/Assistance - 24 hour (Initial 24/7)    Equipment Recommendations  None recommended by OT    Recommendations for Other Services PT consult     Precautions / Restrictions Precautions Precautions: Fall;Other (comment) (Watch Spo2)      Mobility Bed Mobility Overal bed mobility: Needs Assistance Bed Mobility: Supine to Sit     Supine to sit: Min assist;HOB elevated     General bed mobility comments: Min A to elevate trunk    Transfers Overall transfer level: Needs assistance Equipment used: None;Rolling walker (2 wheeled) Transfers: Sit to/from Stand Sit to Stand: Min guard         General transfer comment: Min Guard A for safety    Balance Overall balance assessment: Needs assistance Sitting-balance support:  No upper extremity supported;Feet supported Sitting balance-Leahy Scale: Good     Standing balance support: No upper extremity supported;During functional activity Standing balance-Leahy Scale: Fair Standing balance comment: Able to maintain static standing                           ADL either performed or assessed with clinical judgement   ADL Overall ADL's : Needs assistance/impaired Eating/Feeding: Set up;Sitting   Grooming: Oral care;Supervision/safety;Set up;Wash/dry face;Brushing hair;Standing Grooming Details (indicate cue type and reason): Pt performing grooming tasks at sink with Supervision for safety. SpO2 96-94% on 4L. Upper Body Bathing: Set up;Sitting   Lower Body Bathing: Min guard;Sit to/from stand   Upper Body Dressing : Set up;Sitting   Lower Body Dressing: Min guard;Sit to/from stand Lower Body Dressing Details (indicate cue type and reason): Pt able to long sit in bed and don socks. Toilet Transfer: Min guard;Ambulation;RW (simulated to recliner)           Functional mobility during ADLs: Min guard;Rolling walker;Minimal assistance (without DME, required Min A) General ADL Comments: Pt performing stand pivot to recliner and then mobility to/from sink. Upon arrival, pt with "coughing fit" and SpO2 in mid 80s on 5L.      Vision         Perception     Praxis      Pertinent Vitals/Pain Pain Assessment: No/denies pain     Hand Dominance Right   Extremity/Trunk Assessment Upper Extremity Assessment Upper Extremity Assessment: Generalized weakness (Tremors)   Lower Extremity Assessment Lower Extremity Assessment: Defer to  PT evaluation   Cervical / Trunk Assessment Cervical / Trunk Assessment: Other exceptions Cervical / Trunk Exceptions: Slight forward flexion at shoulder   Communication Communication Communication: No difficulties   Cognition Arousal/Alertness: Awake/alert Behavior During Therapy: WFL for tasks  assessed/performed Overall Cognitive Status: Within Functional Limits for tasks assessed                                     General Comments  Upon arrival, SpO2 85% on 5L. Placed on 6L and SpO2 flucuating between 95--84%. Placing O2 monitor on earlobe, Spo2 reading 96% on 6L with good pleth. Lowered to 4L and SpO2 96-94% . HR 70-80    Exercises     Shoulder Instructions      Home Living Family/patient expects to be discharged to:: Private residence Living Arrangements: Other relatives Available Help at Discharge: Available 24 hours/day;Family Type of Home: Mobile home Home Access: Ramped entrance     Home Layout: One level     Bathroom Shower/Tub: Teacher, early years/pre: Standard     Home Equipment: Cane - single point;Shower seat          Prior Functioning/Environment Level of Independence: Independent        Comments: Used cane as needed - rare. ADLs, IADLs, driving. Taking care of sister untill recently.        OT Problem List: Decreased strength;Decreased range of motion;Decreased activity tolerance;Impaired balance (sitting and/or standing);Decreased knowledge of use of DME or AE;Cardiopulmonary status limiting activity;Decreased knowledge of precautions      OT Treatment/Interventions: Self-care/ADL training;Therapeutic exercise;Energy conservation;DME and/or AE instruction;Therapeutic activities;Patient/family education    OT Goals(Current goals can be found in the care plan section) Acute Rehab OT Goals Patient Stated Goal: Go home OT Goal Formulation: With patient Time For Goal Achievement: 12/02/20 Potential to Achieve Goals: Good  OT Frequency: Min 2X/week   Barriers to D/C:            Co-evaluation              AM-PAC OT "6 Clicks" Daily Activity     Outcome Measure Help from another person eating meals?: None Help from another person taking care of personal grooming?: A Little Help from another person  toileting, which includes using toliet, bedpan, or urinal?: A Little Help from another person bathing (including washing, rinsing, drying)?: A Little Help from another person to put on and taking off regular upper body clothing?: A Little Help from another person to put on and taking off regular lower body clothing?: A Little 6 Click Score: 19   End of Session Equipment Utilized During Treatment: Rolling walker;Oxygen Nurse Communication: Mobility status  Activity Tolerance: Patient tolerated treatment well Patient left: in chair;with call bell/phone within reach;with chair alarm set  OT Visit Diagnosis: Unsteadiness on feet (R26.81);Other abnormalities of gait and mobility (R26.89);Muscle weakness (generalized) (M62.81)                Time: 6195-0932 OT Time Calculation (min): 29 min Charges:  OT General Charges $OT Visit: 1 Visit OT Evaluation $OT Eval Moderate Complexity: 1 Mod OT Treatments $Self Care/Home Management : 8-22 mins  Mashawn Brazil MSOT, OTR/L Acute Rehab Pager: [phone] Office: [place] 11/18/2020, 10:29 AM

## 2020-11-18 NOTE — Progress Notes (Signed)
  Echocardiogram 2D Echocardiogram has been performed.  [name] 11/18/2020, 5:02 PM

## 2020-11-18 NOTE — Consult Note (Addendum)
[facility] Nurse Consult Note: Patient receiving care in [place] Reason for Consult: Wounds on sacrum, BLE and groin Wound type:  BLE have hemosiderin staining with lichenification. #1 Sacral upper gluteal cleft stage 2 measures 1 cm x 1 cm x 0.1 cm pink, red granulation tissue with no drainage. Periwound is purple/maroon.  #2 Sacral lower gluteal cleft stage 2 measures 2 cm x 1 cm x 0.1 cm, red granulation tissue with no drainage. Periwound is purple/maroon # 3 right lateral LE measures 2.4 cm x 1 cm x 0.2 cm 60% red pink tissue 30% yellow slough, 10% eschar with no drainage.  #4 right medial LE measures 1.5 cm x 3 cm, dry scabbed with no drainage.  #5 MASD/ITD bilateral folds of the groin area.   Dressing procedure/placement/frequency: Cleanse sacral wounds with no rinse cleanser, pat dry. Apply Aquacel Advantage Kellie Simmering # [phone]) to left buttock area and secure with sacral foam dressing. Change daily or PRN soiling.  Apply Xeroform gauze Kellie Simmering # 294 if in stock, 295 if not) to bilateral lower extremities, secure with Kerlex from the toes to just below the knees, then wrap with Ace bandage Kellie Simmering # [phone]).   Apply InterDry Ag+ Kellie Simmering ([phone]) to bilateral groin folds.  Measure and cut length of InterDry to fit in skin folds that have skin breakdown Tuck InterDry fabric into skin folds in a single layer, allow for 2 inches of overhang from skin edges to allow for wicking to occur May remove to bathe; dry area thoroughly and then tuck into affected areas again Do not apply any creams or ointments when using InterDry DO NOT THROW AWAY FOR 5 DAYS unless soiled with stool DO NOT [facility] product, this will inactivate the silver in the material  New sheet of Interdry should be applied after 5 days of use if patient continues to have skin breakdown  Monitor the wound area(s) for worsening of condition such as: Signs/symptoms of infection, increase in size, development of or worsening of  odor, development of pain, or increased pain at the affected locations.   Notify the medical team if any of these develop.  Thank you for the consult. [facility] nurse will not follow at this time.   Please re-consult the [facility] team if needed.  Cathlean Marseilles Tamala Julian, MSN, RN, [employer], Lysle Pearl, [facility] Wound Treatment Associate Pager [phone]

## 2020-11-18 NOTE — Progress Notes (Addendum)
Initial Nutrition Assessment  DOCUMENTATION CODES:   Obesity unspecified  INTERVENTION:   -30 ml Prosource Plus BID, each supplement provides 100 kcals and 15 grams protein -MVI with minerals daily -Educated pt on importance of good meal and protein intake to promote wound healing  NUTRITION DIAGNOSIS:   Increased nutrient needs related to wound healing as evidenced by estimated needs.  GOAL:   Patient will meet greater than or equal to 90% of their needs  MONITOR:   PO intake, Supplement acceptance, Labs, Weight trends, Skin, I & O's  REASON FOR ASSESSMENT:   Consult Assessment of nutrition requirement/status  ASSESSMENT:   [name] is a 64 y.o. female with medical history significant of COPD, chronic respiratory failure on 2L, diastolic CHF, hypertension, hyperlipidemia, hypothyroidism, diet-controlled diabetes mellitus type 2, PVD, and cellulitis who presents with complaints of progressively worsening shortness of breath since yesterday.  Pt admitted with respiratory failure and CHF exacerbation.   Reviewed I/O's: -131 ml x 24 hours  UOP: 901 ml x 24 hours  Spoke with pt at bedside, who reports great appetite. Noted pt consumed all of her lunch meal today. Documented meal completions 25-75%. PTA pt reports she has a great appetite, however, only consumes 1 meal per day, which consists of a meat, starch, and vegetable. Pt shares she also snacks on potato chips throughout the day.   Pt denies any weight loss. Reviewed wt hx; pt wt has been stable over the past 11 months.   Discussed importance of good meal and supplement intake to promote healing.   Medications reviewed and include solu-medrol and lasix.   Lab Results  Component Value Date   HGBA1C 5.2 11/18/2020   PTA DM medications are none.   Labs reviewed. Inpatient orders for glycemic control are 0-5 units insulin aspart daily at bedtime and 0-9 units insulin aspart TID with meals).   NUTRITION -  FOCUSED PHYSICAL EXAM:    Most Recent Value  Orbital Region No depletion  Upper Arm Region No depletion  Thoracic and Lumbar Region No depletion  Buccal Region No depletion  Temple Region No depletion  Clavicle Bone Region No depletion  Clavicle and Acromion Bone Region No depletion  Scapular Bone Region No depletion  Dorsal Hand No depletion  Patellar Region No depletion  Anterior Thigh Region No depletion  Posterior Calf Region No depletion  Edema (RD Assessment) Mild  Hair Reviewed  Eyes Reviewed  Mouth Reviewed  Skin Reviewed  Nails Reviewed       Diet Order:   Diet Order            Diet Heart Room service appropriate? Yes; Fluid consistency: Thin  Diet effective now                 EDUCATION NEEDS:   Education needs have been addressed  Skin:  Skin Assessment: Skin Integrity Issues: Skin Integrity Issues:: Other (Comment), Stage II Stage II: sacral upper gluteal cleft, sacral lower gluteal cleft Other: rt medial LE and rt lateral LE with slough and eschar, MASD to bilateral fold of groin  Last BM:  11/17/20  Height:   Ht Readings from Last 1 Encounters:  11/17/20 5\' 6"  (1.676 m)    Weight:   Wt Readings from Last 1 Encounters:  11/18/20 90.4 kg    Ideal Body Weight:  59.1 kg  BMI:  Body mass index is 32.17 kg/m.  Estimated Nutritional Needs:   Kcal:  2050-2250  Protein:  115-130 grams  Fluid:  > 2 L    Loistine Chance, RD, LDN, [place] Registered Dietitian II Certified Diabetes Care and Education Specialist Please refer to [facility] for RD and/or RD on-call/weekend/after hours pager

## 2020-11-18 NOTE — TOC Initial Note (Addendum)
Transition of Care [facility]) - Initial/Assessment Note    Patient Details  Name: [name] MRN: [id number] Date of Birth: 31-Jul-1956  [facility] CM/SW Contact:    Zenon Mayo, RN Phone Number: 11/18/2020, 3:30 PM  Clinical Narrative:                 From home with niece, she has home oxygen  With [facility] in [place] [address] which is part of [place].  She only uses the oxygen at night so if she needs continuous oxygen will need new order and new ambulatory sats.   NCM offered choice for HHRN, HHPT, she declined [place] [facility] [place] services.  She states her niece will transport her home at discharge. Patient also still drives. She is interested in getting portable oxygen. NCM contacted [facility], they states she only has a concentrator with them so she will have to use the tanks for 8 weeks before she can qualify for a portable concentrator.   Expected Discharge Plan: [place] Barriers to Discharge: Continued Medical Work up   Patient Goals and CMS Choice Patient states their goals for this hospitalization and ongoing recovery are:: get better to stop smoking CMS Medicare.gov Compare Post Acute Care list provided to:: Patient Choice offered to / list presented to : Patient  Expected Discharge Plan and Services Expected Discharge Plan: [place]   Discharge Planning Services: CM Consult   Living arrangements for the past 2 months: Mobile Home                   DME Agency: NA       HH Arranged: Refused HH          Prior Living Arrangements/Services Living arrangements for the past 2 months: Mobile Home Lives with:: Adult Children Patient language and need for interpreter reviewed:: Yes Do you feel safe going back to the place where you live?: Yes      Need for Family Participation in Patient Care: Yes (Comment) Care giver support system in place?: Yes (comment) Current home services: DME (has  home oxygen 2 liters,) Criminal Activity/Legal Involvement Pertinent to Current Situation/Hospitalization: No - Comment as needed  Activities of Daily Living Home Assistive Devices/Equipment: Cane (specify quad or straight) ADL Screening (condition at time of admission) Patient's cognitive ability adequate to safely complete daily activities?: Yes Is the patient deaf or have difficulty hearing?: No Does the patient have difficulty seeing, even when wearing glasses/contacts?: No Does the patient have difficulty concentrating, remembering, or making decisions?: No Patient able to express need for assistance with ADLs?: Yes Does the patient have difficulty dressing or bathing?: No Independently performs ADLs?: Yes (appropriate for developmental age) Does the patient have difficulty walking or climbing stairs?: Yes Weakness of Legs: Both Weakness of Arms/Hands: None  Permission Sought/Granted                  Emotional Assessment Appearance:: Appears stated age Attitude/Demeanor/Rapport: Engaged Affect (typically observed): Appropriate Orientation: : Oriented to Self, Oriented to Place, Oriented to  Time, Oriented to Situation Alcohol / Substance Use: Not Applicable Psych Involvement: No (comment)  Admission diagnosis:  COPD exacerbation ([place]) [J44.1] Acute respiratory failure with hypoxia ([place]) [J96.01] Acute on chronic respiratory failure with hypoxia ([place]) [J96.21] Patient Active Problem List   Diagnosis Date Noted  . Acute on chronic respiratory failure with hypoxia ([place]) 11/17/2020  . Protein calorie malnutrition ([place])  11/17/2020  . Hypothyroidism 11/17/2020  . Leg pain 02/05/2020  . CHF exacerbation ([place]) 12/28/2019  . Elevated troponin 12/28/2019  . Myofascial pain syndrome 08/23/2018  . Lumbar spondylosis 08/23/2018  . Leg swelling 07/31/2018  . SOB (shortness of breath) 07/31/2018  . Tobacco abuse 07/31/2018  . Fusion of lumbar spine (L3-S1) 07/20/2018  . 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 Lumbar degenerative disc disease 07/20/2018  . Drug overdose 04/18/2014  . Chronic pain syndrome 04/18/2014  . 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 Respiratory failure ([place]) 04/14/2014  . Acute respiratory failure ([place]) 04/14/2014  . UTI (urinary tract infection) 04/28/2013  . Acute renal failure (ARF) ([place]) 04/28/2013  . Weakness generalized 04/28/2013  . Sepsis ([place]) 04/28/2013  . Chronic diastolic CHF (congestive heart failure) ([place]) 04/28/2013  . Hypokalemia 01/11/2013  . Sinus tachycardia 01/11/2013  . Acute respiratory failure with hypoxia ([place]) 01/10/2013  . COPD exacerbation ([place]) 01/10/2013  . Community acquired pneumonia 01/10/2013  . Diabetes mellitus, type 2 ([place]) 01/10/2013  . Diastolic CHF, acute on chronic (H[facility]) 01/10/2013  . Osteoarthritis of left knee 10/27/2011  . Hypoxemia 03/29/2011  . Acute renal failure ([place]) 03/29/2011  . COPD (chronic obstructive pulmonary disease) ([place]) 03/29/2011  . HTN (hypertension) 03/29/2011  . Hypotension 03/25/2011   PCP:  Madison Hickman, FNP Pharmacy:   [address] [place] [address] Phone: [phone] Fax: [phone]  [place], [place] - [address] [place] [address] Phone: [phone] Fax: [phone]  [place], [place] [address] Phone: [phone] Fax: [phone]     Social Determinants of Health (SDOH) Interventions Food Insecurity Interventions: Intervention Not Indicated Transportation Interventions: Intervention Not Indicated  Readmission Risk Interventions No flowsheet data found.

## 2020-11-18 NOTE — Evaluation (Signed)
Physical Therapy Evaluation Patient Details Name: [name] MRN: [id number] DOB: [birth year] Today's Date: 11/18/2020   History of Present Illness  64 yo female acute on chronic respiratory failure due to copd exacerbation.  PMH including COPD, chronic respiratory failure on 2L, diastolic CHF, hypertension, hyperlipidemia, hypothyroidism, DM, PVD, and cellulitis.  Clinical Impression  Pt admitted with above diagnosis and presents to PT with functional limitations due to deficits listed below (See PT problem list). Pt needs skilled PT to maximize independence and safety to allow discharge to home with rollator and HHPT.      Follow Up Recommendations Home health PT;Supervision - Intermittent    Equipment Recommendations  Other (comment) (rollator)    Recommendations for Other Services       Precautions / Restrictions Precautions Precautions: Fall;Other (comment) (Watch Spo2)      Mobility  Bed Mobility Overal bed mobility: Needs Assistance Bed Mobility: Supine to Sit     Supine to sit: Min assist;HOB elevated     General bed mobility comments: Pt up in chair    Transfers Overall transfer level: Needs assistance Equipment used: 4-wheeled walker;Straight cane Transfers: Sit to/from Stand Sit to Stand: Min guard         General transfer comment: Assist for safety due to tremors  Ambulation/Gait Ambulation/Gait assistance: Min guard;Supervision Gait Distance (Feet): 200 Feet Assistive device: Straight cane;4-wheeled walker Gait Pattern/deviations: Step-through pattern;Decreased stride length;Trunk flexed Gait velocity: decr Gait velocity interpretation: 1.31 - 2.62 ft/sec, indicative of limited community ambulator General Gait Details: Assist for safety due to tremors. Pt with improved stability with rollator.  Stairs            Wheelchair Mobility    Modified Rankin (Stroke Patients Only)       Balance Overall balance assessment: Needs  assistance Sitting-balance support: No upper extremity supported;Feet supported Sitting balance-Leahy Scale: Good     Standing balance support: No upper extremity supported;During functional activity Standing balance-Leahy Scale: Fair Standing balance comment: Able to maintain static standing                             Pertinent Vitals/Pain Pain Assessment: No/denies pain    Home Living Family/patient expects to be discharged to:: Private residence Living Arrangements: Other relatives Available Help at Discharge: Available 24 hours/day;Family Type of Home: Mobile home Home Access: Ramped entrance     Home Layout: One level Home Equipment: Cane - single point;Shower seat      Prior Function Level of Independence: Independent         Comments: Used cane as needed - rare. ADLs, IADLs, driving. Taking care of sister untill recently.     Hand Dominance   Dominant Hand: Right    Extremity/Trunk Assessment   Upper Extremity Assessment Upper Extremity Assessment: Defer to OT evaluation    Lower Extremity Assessment Lower Extremity Assessment: Generalized weakness    Cervical / Trunk Assessment Cervical / Trunk Assessment: Other exceptions Cervical / Trunk Exceptions: Slight forward flexion at shoulder  Communication   Communication: No difficulties  Cognition Arousal/Alertness: Awake/alert Behavior During Therapy: WFL for tasks assessed/performed Overall Cognitive Status: Within Functional Limits for tasks assessed                                        General Comments General comments (skin integrity, edema, etc.): Pt on  4L O2 with SpO2 97% at rest. 87% with amb.    Exercises     Assessment/Plan    PT Assessment Patient needs continued PT services  PT Problem List Decreased strength;Decreased activity tolerance;Decreased balance;Decreased mobility       PT Treatment Interventions DME instruction;Gait training;Functional  mobility training;Therapeutic activities;Therapeutic exercise;Balance training;Patient/family education    PT Goals (Current goals can be found in the Care Plan section)  Acute Rehab PT Goals Patient Stated Goal: Go home PT Goal Formulation: With patient Time For Goal Achievement: 12/02/20 Potential to Achieve Goals: Good    Frequency Min 3X/week   Barriers to discharge        Co-evaluation               AM-PAC PT "6 Clicks" Mobility  Outcome Measure Help needed turning from your back to your side while in a flat bed without using bedrails?: None Help needed moving from lying on your back to sitting on the side of a flat bed without using bedrails?: A Little Help needed moving to and from a bed to a chair (including a wheelchair)?: A Little Help needed standing up from a chair using your arms (e.g., wheelchair or bedside chair)?: A Little Help needed to walk in hospital room?: A Little Help needed climbing 3-5 steps with a railing? : A Little 6 Click Score: 19    End of Session Equipment Utilized During Treatment: Oxygen Activity Tolerance: Patient tolerated treatment well Patient left: in chair;with call bell/phone within reach;with chair alarm set Nurse Communication: Mobility status PT Visit Diagnosis: Other abnormalities of gait and mobility (R26.89);Muscle weakness (generalized) (M62.81)    Time: 5320-2334 PT Time Calculation (min) (ACUTE ONLY): 20 min   Charges:   PT Evaluation $PT Eval Moderate Complexity: [place] Pager [phone] Office [place] 11/18/2020, 12:35 PM

## 2020-11-18 NOTE — Plan of Care (Signed)

## 2020-11-18 NOTE — Progress Notes (Signed)
PROGRESS NOTE  [name]  DOB: [birth year]  PCP: Madison Hickman, FNP VVO:[id number]  DOA: 11/17/2020  LOS: 1 day   Chief Complaint  Patient presents with  . Shortness of Breath   Brief narrative: [name] is a 64 y.o. female with PMH significant for diet-controlled DM2, HTN, HLD, diastolic CHF, peripheral artery disease, chronic lower extremity cellulitis, COPD, chronic respiratory failure on 3L. Patient presented to the ED on 12/6 with 2-day history of progressively worsening shortness of breath, cough with sputum production, wheezing, chest tightness as well as worsening bilateral lower extremity swelling and redness.  No fever.  Continues to focus 1 pack/day. In the ED, patient was afebrile, blood pressure 152/84, respiratory rate 26, required supplemental oxygen up to 5 L by nasal cannula. Blood gas with pH 7.4, PCO2 elevated to 58 seems to be at baseline BNP 712, troponin 13 Chest x-ray noted persistent hilar and interstitial prominence and possible trace left pleural effusion. Influenza and COVID-19 screening were negative.   Patient was given multiple breathing treatments, Solu-Medrol 125 mg IV, magnesium sulfate 2 g IV, Rocephin, and azithromycin.   Admitted under hospital service for further evaluation management.  Subjective: Patient was seen and examined this morning. Sitting up in chair. Not in distress. Falls asleep in the middle of conversation. On 4 L oxygen by nasal cannula. Labs with creatinine 1.04  Assessment/Plan: Acute on chronic respiratory failure with hypoxia -Presented with chest tightness, wheezing, possibly due to COPD exacerbation versus CHF exacerbation or partially both. -On 3 L oxygen at home.  Currently requiring 4 L/min. -Continue supplemental oxygen.  Wean down as tolerated.  Acute on chronic diastolic CHF -worsening shortness of breath, pedal edema, elevated BNP, chest x-ray with edema and effusion. -Home medications include losartan 100  mg daily, amlodipine 5 mg daily, and furosemide 40 mg twice daily. -Currently Lasix 40 mg IV twice daily.  Echocardiogram ordered. -Keep losartan and amlodipine on hold because of risk of AKI with Lasix and to make room for CHF regimen if needed. -Continue to monitor for daily intake output, weight, blood pressure, BNP, renal function and electrolytes. Recent Labs  Lab 11/17/20 0833 11/18/20 0416  BNP 712.6*  --   BUN 7* 13  CREATININE 0.95 1.04*  K 3.7 3.9   Acute exacerbation of COPD -Chest x-ray with persistent hilar and interstitial prominence, adequate pneumonia versus edema.  -With worsening cough, broad-spectrum coverage with IV Rocephin and oral azithromycin. -On admission she was started on Solu-Medrol 60 mg IV every 8 hours.  Will taper down to 60 mg daily.. -Continue bronchodilators, incentive spirometry, Mucinex. -Obtain procalcitonin and trend. Recent Labs  Lab 11/17/20 0833 11/18/20 0416  WBC 7.9 7.5  PROCALCITON  --  <0.10   Type 2 diabetes mellitus -Diet controlled. -A1c 5.7 on 1/16. Repeat A1c. -Blood sugar may trend up with steroids.  Start sliding scale with Accu-Cheks.  Chronic pain:  -Patient on Suboxone 8-2 mg film daily. -Continue pharmacy substitution of Subutex  Hypothyroidism -Continue Synthroid 25 mcg daily  Thrombocytopenia -Platelets around low normal.  Continue to monitor.  Recent Labs  Lab 11/17/20 0833 11/18/20 0416  PLT 147* 172   Protein calorie malnutrition:  -On admission albumin noted to be 2.8. -Nutrition consultation.  Tobacco abuse -patient reportedly smokes 1 pack cigarettes per day on average. She states that she wants to quit -Counseled the patient on need of cessation of tobacco  History of partial seizures -Patient reported not currently taking Keppra  High  likelihood of sleep apnea -Falls asleep in the middle of conversation.   -Patient states he recently got overnight sleep study done at [facility].   Has not heard the report yet.  Mobility: Encourage ambulation.  Pending PT eval Code Status:   Code Status: Full Code  Nutritional status: Body mass index is 32.17 kg/m.     Diet Order            Diet Heart Room service appropriate? Yes; Fluid consistency: Thin  Diet effective now                 DVT prophylaxis: enoxaparin (LOVENOX) injection 40 mg Start: 11/17/20 1330   Antimicrobials:  IV Rocephin and azithromycin Fluid: None Consultants: None Family Communication:  None at bedside  Status is: Inpatient  Remains inpatient appropriate because -continues to require IV Lasix, IV steroids, and monitoring.  Dispo: The patient is from: Home              Anticipated d/c is to: Home, may need home health              Anticipated d/c date is: 1 to 2 days              Patient currently is not medically stable to d/c.   Infusions:  . cefTRIAXone (ROCEPHIN)  IV 1 g (11/18/20 1141)    Scheduled Meds: . arformoterol  15 mcg Nebulization BID  . ARIPiprazole  10 mg Oral Daily  . azithromycin  500 mg Oral Daily  . budesonide (PULMICORT) nebulizer solution  0.5 mg Nebulization BID  . buprenorphine  8 mg Sublingual Daily  . busPIRone  30 mg Oral BID  . enoxaparin (LOVENOX) injection  40 mg Subcutaneous Daily  . escitalopram  20 mg Oral q morning - 10a  . fenofibrate  160 mg Oral Daily  . furosemide  40 mg Intravenous BID  . gabapentin  800 mg Oral TID  . guaiFENesin  600 mg Oral BID  . insulin aspart  0-5 Units Subcutaneous QHS  . insulin aspart  0-9 Units Subcutaneous TID WC  . levothyroxine  25 mcg Oral QAC breakfast  . [START ON 11/19/2020] methylPREDNISolone (SOLU-MEDROL) injection  60 mg Intravenous Daily  . nicotine  21 mg Transdermal Q24H  . pantoprazole  40 mg Oral q morning - 10a  . pramipexole  0.5 mg Oral QHS  . sodium chloride flush  3 mL Intravenous Q12H    Antimicrobials: Anti-infectives (From admission, onward)   Start     Dose/Rate Route Frequency Ordered  Stop   11/18/20 1230  azithromycin (ZITHROMAX) tablet 500 mg        500 mg Oral Daily 11/18/20 1137     11/18/20 1200  cefTRIAXone (ROCEPHIN) 1 g in sodium chloride 0.9 % 100 mL IVPB        1 g 200 mL/hr over 30 Minutes Intravenous Every 24 hours 11/17/20 1317     11/17/20 1200  cefTRIAXone (ROCEPHIN) 1 g in sodium chloride 0.9 % 100 mL IVPB        1 g 200 mL/hr over 30 Minutes Intravenous  Once 11/17/20 1156 11/17/20 1454   11/17/20 1200  azithromycin (ZITHROMAX) 500 mg in sodium chloride 0.9 % 250 mL IVPB        500 mg 250 mL/hr over 60 Minutes Intravenous  Once 11/17/20 1156 11/17/20 1535      PRN meds: acetaminophen, hydrOXYzine, ipratropium-albuterol, ondansetron **OR** ondansetron (ZOFRAN) IV   Objective: Vitals:  11/18/20 0749 11/18/20 0800  BP:  131/63  Pulse:  64  Resp:  17  Temp:  98.3 F (36.8 C)  SpO2: 90% 92%    Intake/Output Summary (Last 24 hours) at 11/18/2020 1157 Last data filed at 11/18/2020 0900 Gross per 24 hour  Intake 960 ml  Output 901 ml  Net 59 ml   Filed Weights   11/17/20 1518 11/18/20 0206  Weight: 91.6 kg 90.4 kg   Weight change:  Body mass index is 32.17 kg/m.   Physical Exam: General exam: Present, not in distress Skin: No rashes, lesions or ulcers. HEENT: Atraumatic, normocephalic, no obvious bleeding Lungs: Mild scattered wheezing bilaterally CVS: Regular rate and rhythm, no murmur GI/Abd soft, nontender, nondistended, bowel sound present CNS: Alert, awake, oriented x3, falls asleep in the middle conversation however. Psychiatry: Mood appropriate Extremities: Pedal edema trace bilaterally improving, no calf tenderness  Data Review: I have personally reviewed the laboratory data and studies available.  Recent Labs  Lab 11/17/20 0833 11/18/20 0416  WBC 7.9 7.5  NEUTROABS 6.3  --   HGB 16.5* 15.4*  HCT 51.3* 47.1*  MCV 91.1 91.5  PLT 147* 172   Recent Labs  Lab 11/17/20 0833 11/18/20 0416  NA 139 138  K 3.7 3.9   CL 98 96*  CO2 30 33*  GLUCOSE 84 162*  BUN 7* 13  CREATININE 0.95 1.04*  CALCIUM 9.2 8.8*    F/u labs ordered  Signed, Terrilee Croak, MD [facility] 11/18/2020

## 2020-11-19 ENCOUNTER — Encounter: Payer: Self-pay | Admitting: *Deleted

## 2020-11-19 DIAGNOSIS — Z006 Encounter for examination for normal comparison and control in clinical research program: Secondary | ICD-10-CM

## 2020-11-19 LAB — CBC WITH DIFFERENTIAL/PLATELET
Abs Immature Granulocytes: 0.04 10*3/uL (ref 0.00–0.07)
Basophils Absolute: 0 10*3/uL (ref 0.0–0.1)
Basophils Relative: 0 %
Eosinophils Absolute: 0 10*3/uL (ref 0.0–0.5)
Eosinophils Relative: 0 %
HCT: 49.6 % — ABNORMAL HIGH (ref 36.0–46.0)
Hemoglobin: 15.2 g/dL — ABNORMAL HIGH (ref 12.0–15.0)
Immature Granulocytes: 0 %
Lymphocytes Relative: 14 %
Lymphs Abs: 1.3 10*3/uL (ref 0.7–4.0)
MCH: 28.7 pg (ref 26.0–34.0)
MCHC: 30.6 g/dL (ref 30.0–36.0)
MCV: 93.6 fL (ref 80.0–100.0)
Monocytes Absolute: 0.6 10*3/uL (ref 0.1–1.0)
Monocytes Relative: 7 %
Neutro Abs: 7.1 10*3/uL (ref 1.7–7.7)
Neutrophils Relative %: 79 %
Platelets: 182 10*3/uL (ref 150–400)
RBC: 5.3 MIL/uL — ABNORMAL HIGH (ref 3.87–5.11)
RDW: 19.1 % — ABNORMAL HIGH (ref 11.5–15.5)
WBC: 9.1 10*3/uL (ref 4.0–10.5)
nRBC: 0 % (ref 0.0–0.2)

## 2020-11-19 LAB — BASIC METABOLIC PANEL
Anion gap: 12 (ref 5–15)
BUN: 23 mg/dL (ref 8–23)
CO2: 32 mmol/L (ref 22–32)
Calcium: 9.1 mg/dL (ref 8.9–10.3)
Chloride: 94 mmol/L — ABNORMAL LOW (ref 98–111)
Creatinine, Ser: 1.04 mg/dL — ABNORMAL HIGH (ref 0.44–1.00)
GFR, Estimated: >60 mL/min (ref >60)
Glucose, Bld: 116 mg/dL — ABNORMAL HIGH (ref 70–99)
Potassium: 3.9 mmol/L (ref 3.5–5.1)
Sodium: 138 mmol/L (ref 135–145)

## 2020-11-19 LAB — PHOSPHORUS: Phosphorus: 3.1 mg/dL (ref 2.5–4.6)

## 2020-11-19 LAB — GLUCOSE, CAPILLARY: Glucose-Capillary: 119 mg/dL — ABNORMAL HIGH (ref 70–99)

## 2020-11-19 LAB — MAGNESIUM: Magnesium: 2.2 mg/dL (ref 1.7–2.4)

## 2020-11-19 LAB — PROCALCITONIN: Procalcitonin: <0.1 ng/mL

## 2020-11-19 LAB — BRAIN NATRIURETIC PEPTIDE: B Natriuretic Peptide: 488.5 pg/mL — ABNORMAL HIGH (ref 0.0–100.0)

## 2020-11-19 MED ORDER — PREDNISONE 10 MG PO TABS: ORAL_TABLET | ORAL | 0 refills | Status: DC

## 2020-11-19 MED ORDER — ORAL CARE MOUTH RINSE
15.0000 mL | Freq: Two times a day (BID) | OROMUCOSAL | Status: DC
  Administered 2020-11-19: 15 mL via OROMUCOSAL

## 2020-11-19 MED ORDER — STUDY - DAPA TIMI 68 - DAPAGLIFLOZIN (FARXIGA) 10 MG OR PLACEBO TABLET (PI-DALTON MCLEAN)
1.0000 | ORAL_TABLET | Freq: Every day | ORAL | Status: DC
  Administered 2020-11-19: 1 via ORAL
  Filled 2020-11-19: qty 1

## 2020-11-19 MED ORDER — AZITHROMYCIN 500 MG PO TABS: 500.0000 mg | ORAL_TABLET | Freq: Every day | ORAL | 0 refills | Status: AC

## 2020-11-19 MED ORDER — FUROSEMIDE 40 MG PO TABS: 40.0000 mg | ORAL_TABLET | Freq: Every day | ORAL | 0 refills | Status: DC

## 2020-11-19 MED ORDER — GUAIFENESIN ER 600 MG PO TB12: 600.0000 mg | ORAL_TABLET | Freq: Two times a day (BID) | ORAL | 0 refills | Status: AC

## 2020-11-19 NOTE — Research (Signed)
Subject Name: [name]  Subject met inclusion and exclusion criteria.  The informed consent form, study requirements and expectations were reviewed with the subject and questions and concerns were addressed prior to the signing of the consent form.  The subject verbalized understanding of the trial requirements.  The subject agreed to participate in the [place] ACT-HF trial and signed the informed consent at 1411 on 11/19/20  The informed consent was obtained prior to performance of any protocol-specific procedures for the subject.  A copy of the signed informed consent was given to the subject and a copy was placed in the subject's medical record.   Timoteo Gaul

## 2020-11-19 NOTE — Progress Notes (Signed)
Occupational Therapy Treatment Patient Details Name: [name] MRN: [id number] DOB: 05/02/1956 Today's Date: 11/19/2020    History of present illness 64 yo female acute on chronic respiratory failure due to copd exacerbation.  PMH including COPD, chronic respiratory failure on 2L, diastolic CHF, hypertension, hyperlipidemia, hypothyroidism, DM, PVD, and cellulitis.   OT comments  Pt educated in energy conservation strategies with written handout provided. Pt, overall, requiring set up to min guard assist for ADL. Pt on 3L02 with Sp02 92% during session. Pt states she would like to stop smoking, but her insurance does not pay for patches. Recommended pt use her tub transfer bench and avoid stepping over edge of tub.   Follow Up Recommendations  No OT follow up;Supervision/Assistance - 24 hour    Equipment Recommendations  None recommended by OT    Recommendations for Other Services PT consult    Precautions / Restrictions Precautions Precautions: Fall       Mobility Bed Mobility   Bed Mobility: Supine to Sit;Sit to Supine     Supine to sit: Supervision Sit to supine: Supervision      Transfers Overall transfer level: Needs assistance Equipment used: Rolling walker (2 wheeled) Transfers: Sit to/from Stand Sit to Stand: Min guard              Balance     Sitting balance-Leahy Scale: Good       Standing balance-Leahy Scale: Fair                             ADL either performed or assessed with clinical judgement   ADL Overall ADL's : Needs assistance/impaired     Grooming: Wash/dry hands;Standing;Min guard               Lower Body Dressing: Set up;Bed level Lower Body Dressing Details (indicate cue type and reason): Pt able to long sit in bed and don socks. Toilet Transfer: Min guard;Ambulation;RW   Toileting- Water quality scientist and Hygiene: Min guard;Sit to/from Nurse, children's Details (indicate cue type and  reason): recommended pt avoid stepping over edge of tub, she believes she has a tub bench at home. Recommended using 02 while showering. Functional mobility during ADLs: Min guard;Rolling walker General ADL Comments: Instructed in energy conservation strategies and provided written handout.      Vision       Perception     Praxis      Cognition Arousal/Alertness: Awake/alert Behavior During Therapy: WFL for tasks assessed/performed Overall Cognitive Status: Within Functional Limits for tasks assessed                                          Exercises     Shoulder Instructions       General Comments      Pertinent Vitals/ Pain       Pain Assessment: No/denies pain  Home Living                                          Prior Functioning/Environment              Frequency  Min 2X/week        Progress Toward Goals  OT Goals(current goals can  now be found in the care plan section)  Progress towards OT goals: Progressing toward goals  Acute Rehab OT Goals OT Goal Formulation: With patient Time For Goal Achievement: 12/02/20 Potential to Achieve Goals: Good  Plan Discharge plan remains appropriate    Co-evaluation                 AM-PAC OT "6 Clicks" Daily Activity     Outcome Measure   Help from another person eating meals?: None Help from another person taking care of personal grooming?: A Little Help from another person toileting, which includes using toliet, bedpan, or urinal?: A Little Help from another person bathing (including washing, rinsing, drying)?: None Help from another person to put on and taking off regular upper body clothing?: A Little   6 Click Score: 17    End of Session Equipment Utilized During Treatment: Rolling walker;Oxygen      Activity Tolerance Patient tolerated treatment well   Patient Left in bed;with call bell/phone within reach   Nurse Communication          Time:  1310-1330 OT Time Calculation (min): 20 min  Charges: OT General Charges $OT Visit: 1 Visit OT Treatments $Self Care/Home Management : 8-22 mins  Nestor Lewandowsky, OTR/L Acute Rehabilitation Services Pager: [phone] Office: [phone]   Malka So 11/19/2020, 1:33 PM

## 2020-11-19 NOTE — Progress Notes (Signed)
Wound care done

## 2020-11-19 NOTE — Discharge Summary (Signed)
Physician Discharge Summary  PENDA VENTURI JKD:[id number] DOB: 1956/06/25 DOA: 11/17/2020  PCP: Madison Hickman, FNP  Admit date: 11/17/2020 Discharge date: 11/19/2020  Admitted From: Home Discharge disposition: Home   Code Status: Full Code  Diet Recommendation: Cardiac/diabetic diet  Discharge Diagnosis:   Principal Problem:   Acute on chronic respiratory failure with hypoxia ([place]) Active Problems:   HTN (hypertension)   COPD exacerbation (HCC)   Diabetes mellitus, type 2 (HCC)   Diastolic CHF, acute on chronic (HCC)   Tobacco abuse   Protein calorie malnutrition ([place])   Hypothyroidism  History of Present Illness / Brief narrative:  [name] is a 64 y.o. female with PMH significant for diet-controlled DM2, HTN, HLD, diastolic CHF, peripheral artery disease, chronic lower extremity cellulitis, COPD, chronic respiratory failure on 3L. Patient presented to the ED on 12/6 with 2-day history of progressively worsening shortness of breath, cough with sputum production, wheezing, chest tightness as well as worsening bilateral lower extremity swelling and redness.  No fever.  Continues to focus 1 pack/day. In the ED, patient was afebrile, blood pressure 152/84, respiratory rate 26, required supplemental oxygen up to 5 L by nasal cannula. Blood gas with pH 7.4, PCO2 elevated to 58 seems to be at baseline BNP 712, troponin 13 Chest x-ray noted persistent hilar and interstitial prominence and possible trace left pleural effusion. Influenza and COVID-19 screening were negative.  Patient was given multiple breathing treatments, Solu-Medrol 125 mg IV, magnesium sulfate 2 g IV, Rocephin, and azithromycin.  Admitted under hospital service for further evaluation management.  Subjective:  Seen and examined this morning.  Lying on bed.  Not in distress.  Feels ready to go home.  Hospital Course:  Acute on chronic respiratory failure with hypoxia -Presented with chest tightness,  wheezing, possibly due to COPD exacerbation versus CHF exacerbation or partially both. -Initially required high flow oxygen.  Currently on 3 L by nasal cannula which is her home requirement. -Continue supplemental oxygen.  Wean down as tolerated.  Acute on chronic diastolic CHF -worsening shortness of breath, pedal edema, elevated BNP, chest x-ray with edema and effusion. -Home medications include losartan 100 mg daily, amlodipine 5 mg daily, and furosemide 40 mg twice daily. -Treated with IV Lasix.  Net negative balance of 4 L since admission.  -Echocardiogram with EF 60 to 09%, grade 2 diastolic dysfunction. -Discharge home on the same regimen as before. Recent Labs  Lab 11/17/20 0833 11/18/20 0416 11/19/20 0120  BNP 712.6*  --  488.5*  BUN 7* 13 23  CREATININE 0.95 1.04* 1.04*  K 3.7 3.9 3.9  MG  --   --  2.2   Acute exacerbation of COPD -Chest x-ray with persistent hilar and interstitial prominence, adequate pneumonia versus edema.  -With worsening cough,  patient was started on broad-spectrum coverage with IV Rocephin and oral azithromycin as well as IV steroids. -We will discharge her on 3 more days of oral azithromycin and a tapering course of prednisone.  Type 2 diabetes mellitus -Diet controlled. -A1c 5.2 on 12/7. Repeat A1c.  Chronic pain:  -Patient is on Suboxone 8-2 mg film daily.  Hypothyroidism -Continue Synthroid 25 mcg daily  Thrombocytopenia -Improved Recent Labs  Lab 11/17/20 0833 11/18/20 0416 11/19/20 0120  PLT 147* 172 182   Protein calorie malnutrition:  -On admission albumin noted to be 2.8. -Nutrition consultation.  Tobacco abuse -patient reportedly smokes 1 pack cigarettes per day on average. She states that she wants to quit -Counseled the  patient on need of cessation of tobacco  History of partial seizures -Patient reported not currently taking Keppra  High likelihood of sleep apnea -Falls asleep in the middle of  conversation.   -Patient states he recently got overnight sleep study done at [facility].  Has not heard the report yet.  Stable for discharge today.  PT recommended home health PT.  Patient refused home health services.  Wound care: Incision 10/28/11 Knee Left (Active)  Date First Assessed/Time First Assessed: 10/28/11 1150   Location: Knee  Location Orientation: Left    Assessments 10/28/2011  1:09 PM 11/01/2011  7:45 AM  Dressing Type -- Gauze (Comment)  Dressing Clean;Dry;Intact --  Site / Wound Assessment Clean;Dry Clean;Dry  Drainage Amount None --     No Linked orders to display     Wound 07/09/12 Other (Comment) Face Right x 2 (Active)  Date First Assessed/Time First Assessed: 07/09/12 1415   Wound Type: (c) Other (Comment)  Location: Face  Location Orientation: Right  Wound Description (Comments): x 2    No assessment data to display     No Linked orders to display     Wound 07/09/12 Other (Comment) Shoulder Right x 2 (Active)  Date First Assessed/Time First Assessed: 07/09/12 1416   Wound Type: (c) Other (Comment)  Location: Shoulder  Location Orientation: Right  Wound Description (Comments): x 2    No assessment data to display     No Linked orders to display     Wound 01/10/13 Other (Comment) Hand Left;Posterior dog bite (Active)  Date First Assessed/Time First Assessed: 01/10/13 2230   Wound Type: Other (Comment)  Location: Hand  Location Orientation: Left;Posterior  Wound Description (Comments): dog bite  Present on Admission: Yes    Assessments 01/10/2013 10:37 PM 01/13/2013  7:50 AM  Dressing Status None None  Site / Wound Assessment Dry Dry  % Wound base Red or Granulating -- 75%  % Wound base Yellow/Fibrinous Exudate 100% --  % Wound base Black/Eschar -- 25%  Wound Length (cm) 1 cm --  Wound Width (cm) 1 cm --  Drainage Amount None None     No Linked orders to display     Wound 01/10/13 Excoriated (Scratch marks) Wrist Left dog scratches (Active)   Date First Assessed/Time First Assessed: 01/10/13 2230   Wound Type: Excoriated (Scratch marks)  Location: Wrist  Location Orientation: Left  Wound Description (Comments): dog scratches  Present on Admission: Yes    Assessments 01/10/2013 10:37 PM  Dressing Status None  Site / Wound Assessment Dry;Pink  Drainage Amount None     No Linked orders to display     Wound 04/29/13 Abrasion(s);Other (Comment) Arm Left scattered scabs from old wounds (Active)  Date First Assessed/Time First Assessed: 04/29/13 1532   Wound Type: (c) Abrasion(s);Other (Comment)  Location: Arm  Location Orientation: Left  Wound Description (Comments): scattered scabs from old wounds  Present on Admission: Yes    Assessments 04/29/2013  3:32 PM 04/30/2013  8:40 AM  Dressing Type None None     No Linked orders to display     Pressure Injury 11/17/20 Sacrum (Active)  Date First Assessed/Time First Assessed: 11/17/20 1530   Location: Sacrum    Assessments 11/17/2020  7:15 PM 11/18/2020  9:00 PM  Dressing Type -- Foam - Lift dressing to assess site every shift  Dressing -- Dry;Intact  Dressing Change Frequency -- PRN  Site / Wound Assessment Clean;Dry;Painful;Red;Pink;Yellow Pink  Peri-wound Assessment -- Intact;Erythema (blanchable)  Margins --  Unattached edges (unapproximated)  Drainage Amount None None  Treatment Cleansed --     No Linked orders to display    Discharge Exam:   Vitals:   11/19/20 0000 11/19/20 0400 11/19/20 0834 11/19/20 1113  BP: (!) 119/55 (!) 114/59    Pulse: (!) 56 (!) 43 64   Resp: 12 16 (!) 21   Temp: (!) 97.5 F (36.4 C) 98.6 F (37 C)  98.5 F (36.9 C)  TempSrc: Oral Oral    SpO2: 97% 96% 96%   Weight: 89.9 kg     Height:        Body mass index is 31.99 kg/m.  General exam: Pleasant, middle-aged Caucasian female.  Not in distress Skin: No rashes, lesions or ulcers. HEENT: Atraumatic, normocephalic, no obvious bleeding Lungs: Clear to auscultate bilaterally, mild scattered  end expiratory wheezing CVS: Regular rate and rhythm, no murmur GI/Abd soft, nontender, nondistended, bowel sounds present CNS: Alert, awake, oriented x3 Psychiatry: Mood appropriate Extremities: Pedal edema improved bilaterally  Follow ups:   Discharge Instructions    Diet - low sodium heart healthy   Complete by: As directed    Diet Carb Modified   Complete by: As directed    Discharge wound care:   Complete by: As directed    Apply InterDry Ag+ Kellie Simmering [phone]) to bilateral groin folds.  Measure and cut length of InterDry to fit in skin folds that have skin breakdown Tuck InterDry fabric into skin folds in a single layer, allow for 2 inches of overhang from skin edges to allow for wicking to occur May remove to bathe; dry area thoroughly and then tuck into affected areas again Do not apply any creams or ointments when using InterDry DO NOT THROW AWAY FOR 5 DAYS unless soiled with stool DO NOT [facility] product, this will inactivate the silver in the material  New sheet of Interdry should be applied after 5 days of use if patient continues to have skin breakdown   Increase activity slowly   Complete by: As directed       Follow-up Information    Madison Hickman, FNP Follow up.   Specialty: Family Medicine Contact information: [address] [place] [address] [phone]               Recommendations for Outpatient Follow-Up:   1. Follow-up PCP as an outpatient  Discharge Instructions:  Follow with Primary MD Madison Hickman, FNP in 7 days   Get CBC/BMP checked in next visit within 1 week by PCP or SNF MD ( we routinely change or add medications that can affect your baseline labs and fluid status, therefore we recommend that you get the mentioned basic workup next visit with your PCP, your PCP may decide not to get them or add new tests based on their clinical decision)  On your next visit with your PCP, please Get Medicines reviewed and adjusted.  Please  request your PCP  to go over all Hospital Tests and Procedure/Radiological results at the follow up, please get all Hospital records sent to your Prim MD by signing hospital release before you go home.  Activity: As tolerated with Full fall precautions use walker/cane & assistance as needed  For Heart failure patients - Check your Weight same time everyday, if you gain over 2 pounds, or you develop in leg swelling, experience more shortness of breath or chest pain, call your Primary MD immediately. Follow Cardiac Low Salt Diet and 1.5 lit/day fluid  restriction.  If you have smoked or chewed Tobacco in the last 2 yrs please stop smoking, stop any regular Alcohol  and or any Recreational drug use.  If you experience worsening of your admission symptoms, develop shortness of breath, life threatening emergency, suicidal or homicidal thoughts you must seek medical attention immediately by calling 911 or calling your MD immediately  if symptoms less severe.  You Must read complete instructions/literature along with all the possible adverse reactions/side effects for all the Medicines you take and that have been prescribed to you. Take any new Medicines after you have completely understood and accpet all the possible adverse reactions/side effects.   Do not drive, operate heavy machinery, perform activities at heights, swimming or participation in water activities or provide baby sitting services if your were admitted for syncope or siezures until you have seen by Primary MD or a Neurologist and advised to do so again.  Do not drive when taking Pain medications.  Do not take more than prescribed Pain, Sleep and Anxiety Medications  Wear Seat belts while driving.   Please note You were cared for by a hospitalist during your hospital stay. If you have any questions about your discharge medications or the care you received while you were in the hospital after you are discharged, you can call the unit and  asked to speak with the hospitalist on call if the hospitalist that took care of you is not available. Once you are discharged, your primary care physician will handle any further medical issues. Please note that NO REFILLS for any discharge medications will be authorized once you are discharged, as it is imperative that you return to your primary care physician (or establish a relationship with a primary care physician if you do not have one) for your aftercare needs so that they can reassess your need for medications and monitor your lab values.    Allergies as of 11/19/2020      Reactions   Acetaminophen Other (See Comments)   Kidney issues   Aspirin Other (See Comments)   Dr. Michela Pitcher do not take due to kidneys   Nsaids Other (See Comments)   Pt states it messes up her kidneys   Vicodin [hydrocodone-acetaminophen] Nausea And Vomiting      Medication List    STOP taking these medications   cephALEXin 500 MG capsule Commonly known as: KEFLEX   doxycycline 100 MG capsule Commonly known as: VIBRAMYCIN   Flovent HFA 110 MCG/ACT inhaler Generic drug: fluticasone   fluticasone 110 MCG/ACT inhaler Commonly known as: FLOVENT HFA   varenicline 0.5 MG tablet Commonly known as: Chantix   varenicline 1 MG tablet Commonly known as: Chantix Continuing Month Pak     TAKE these medications   albuterol 108 (90 Base) MCG/ACT inhaler Commonly known as: VENTOLIN HFA Inhale 2 puffs into the lungs every 4 (four) hours as needed for wheezing or shortness of breath.   amLODipine 5 MG tablet Commonly known as: NORVASC Take 5 mg by mouth daily.   Anoro Ellipta 62.5-25 MCG/INH Aepb Generic drug: umeclidinium-vilanterol Inhale 1 puff into the lungs daily.   ARIPiprazole 10 MG tablet Commonly known as: ABILIFY Take 10 mg by mouth daily.   Aspirin 81 81 MG EC tablet Generic drug: aspirin Take 81 mg by mouth daily.   azithromycin 500 MG tablet Commonly known as: ZITHROMAX Take 1 tablet  (500 mg total) by mouth daily for 3 days. Start taking on: November 20, 2020   busPIRone 30  MG tablet Commonly known as: BUSPAR Take 30 mg by mouth 2 (two) times daily.   escitalopram 20 MG tablet Commonly known as: LEXAPRO Take 20 mg by mouth every morning.   famotidine 40 MG tablet Commonly known as: PEPCID Take 40 mg by mouth daily.   fenofibrate micronized 134 MG capsule Commonly known as: LOFIBRA Take 134 mg by mouth daily before breakfast.   furosemide 40 MG tablet Commonly known as: LASIX Take 1 tablet (40 mg total) by mouth daily. May take additional 40 mg (one tablet) as needed for swelling, weight gain or sob What changed:   when to take this  additional instructions   gabapentin 800 MG tablet Commonly known as: NEURONTIN Take 800 mg by mouth 3 (three) times daily.   guaiFENesin 600 MG 12 hr tablet Commonly known as: MUCINEX Take 1 tablet (600 mg total) by mouth 2 (two) times daily for 14 days.   hydrOXYzine 25 MG tablet Commonly known as: ATARAX/VISTARIL Take 25 mg by mouth every 6 (six) hours as needed for anxiety or itching.   levETIRAcetam 500 MG tablet Commonly known as: KEPPRA Take 1 tablet (500 mg total) by mouth 2 (two) times daily.   levothyroxine 25 MCG tablet Commonly known as: SYNTHROID Take 25 mcg by mouth daily before breakfast.   losartan 100 MG tablet Commonly known as: COZAAR Take 100 mg by mouth daily.   nicotine 21 mg/24hr patch Commonly known as: NICODERM CQ - dosed in mg/24 hours Place 1 patch (21 mg total) onto the skin daily.   pantoprazole 40 MG tablet Commonly known as: PROTONIX Take 40 mg by mouth every morning.   pramipexole 0.5 MG tablet Commonly known as: MIRAPEX Take 0.5 mg by mouth at bedtime.   predniSONE 10 MG tablet Commonly known as: DELTASONE Take 4 tablets daily X 2 days, then, Take 3 tablets daily X 2 days, then, Take 2 tablets daily X 2 days, then, Take 1 tablets daily X 1 day.   Suboxone 8-2 MG  Film Generic drug: Buprenorphine HCl-Naloxone HCl Place 1 each under the tongue daily.   Symbicort 160-4.5 MCG/ACT inhaler Generic drug: budesonide-formoterol Inhale 2 puffs into the lungs daily.   Vitamin D (Ergocalciferol) 1.25 MG (50000 UNIT) Caps capsule Commonly known as: DRISDOL Take 50,000 Units by mouth once a week.   Vyvanse 20 MG capsule Generic drug: lisdexamfetamine Take 20 mg by mouth daily.            Discharge Care Instructions  (From admission, onward)         Start     Ordered   11/19/20 0000  Discharge wound care:       Comments: Apply InterDry Ag+ Kellie Simmering [phone]) to bilateral groin folds.  Measure and cut length of InterDry to fit in skin folds that have skin breakdown Tuck InterDry fabric into skin folds in a single layer, allow for 2 inches of overhang from skin edges to allow for wicking to occur May remove to bathe; dry area thoroughly and then tuck into affected areas again Do not apply any creams or ointments when using InterDry DO NOT THROW AWAY FOR 5 DAYS unless soiled with stool DO NOT [facility] product, this will inactivate the silver in the material  New sheet of Interdry should be applied after 5 days of use if patient continues to have skin breakdown   11/19/20 1306          Time coordinating discharge: 35 minutes  The results of significant  diagnostics from this hospitalization (including imaging, microbiology, ancillary and laboratory) are listed below for reference.    Procedures and Diagnostic Studies:   DG Chest Port 1 View  Result Date: 11/17/2020 CLINICAL DATA:  Cough, COPD EXAM: PORTABLE CHEST 1 VIEW COMPARISON:  01/01/2020 FINDINGS: Persistent hilar and interstitial prominence. Right costophrenic angle is partially excluded. Possible trace left pleural effusion. No pneumothorax. Similar cardiomediastinal contours. IMPRESSION: Persistent hilar and interstitial prominence and possible trace left pleural effusion. Favored to reflect  mild edema superimposed on chronic changes. Atypical pneumonia is an additional consideration. Electronically Signed   By: Macy Mis M.D.   On: 11/17/2020 08:44   ECHOCARDIOGRAM COMPLETE  Result Date: 11/18/2020    ECHOCARDIOGRAM REPORT   Patient Name:   [name] Date of Exam: 11/18/2020 Medical Rec #:  [id number]      Height:       66.0 in Accession #:    [id number]     Weight:       199.3 lb Date of Birth:  December 09, 1956       BSA:          1.997 m Patient Age:    52 years       BP:           135/90 mmHg Patient Gender: F              HR:           55 bpm. Exam Location:  Inpatient Procedure: 2D Echo, Cardiac Doppler and Color Doppler Indications:    R07.9* Chest pain, unspecified  History:        Patient has prior history of Echocardiogram examinations, most                 recent 12/29/2019. CHF, COPD, Signs/Symptoms:Dyspnea; Risk                 Factors:Hypertension, Diabetes and Dyslipidemia. Seizures. GERD.  Sonographer:    Tiffany Dance Referring Phys: [id number] RONDELL A SMITH IMPRESSIONS  1. Left ventricular ejection fraction, by estimation, is 60 to 65%. The left ventricle has normal function. The left ventricle has no regional wall motion abnormalities. Left ventricular diastolic parameters are consistent with Grade II diastolic dysfunction (pseudonormalization). Elevated left atrial pressure.  2. Right ventricular systolic function is normal. The right ventricular size is normal. There is mildly elevated pulmonary artery systolic pressure.  3. Left atrial size was severely dilated.  4. Right atrial size was severely dilated.  5. The mitral valve is normal in structure. Mild mitral valve regurgitation.  6. The aortic valve is normal in structure. Aortic valve regurgitation is not visualized. Mild aortic valve sclerosis is present, with no evidence of aortic valve stenosis.  7. The inferior vena cava is dilated in size with <50% respiratory variability, suggesting right atrial pressure of 15 mmHg.  FINDINGS  Left Ventricle: Left ventricular ejection fraction, by estimation, is 60 to 65%. The left ventricle has normal function. The left ventricle has no regional wall motion abnormalities. The left ventricular internal cavity size was normal in size. There is  borderline concentric left ventricular hypertrophy. Left ventricular diastolic parameters are consistent with Grade II diastolic dysfunction (pseudonormalization). Elevated left atrial pressure. Right Ventricle: The right ventricular size is normal. No increase in right ventricular wall thickness. Right ventricular systolic function is normal. There is mildly elevated pulmonary artery systolic pressure. The tricuspid regurgitant velocity is 2.40  m/s, and with an assumed right atrial pressure of 15  mmHg, the estimated right ventricular systolic pressure is 85.0 mmHg. Left Atrium: Left atrial size was severely dilated. Right Atrium: Right atrial size was severely dilated. Pericardium: There is no evidence of pericardial effusion. Mitral Valve: The mitral valve is normal in structure. Mild mitral annular calcification. Mild mitral valve regurgitation. Tricuspid Valve: The tricuspid valve is normal in structure. Tricuspid valve regurgitation is mild. Aortic Valve: The aortic valve is normal in structure. Aortic valve regurgitation is not visualized. Mild aortic valve sclerosis is present, with no evidence of aortic valve stenosis. Pulmonic Valve: The pulmonic valve was grossly normal. Pulmonic valve regurgitation is not visualized. Aorta: The aortic root and ascending aorta are structurally normal, with no evidence of dilitation. Venous: The inferior vena cava is dilated in size with less than 50% respiratory variability, suggesting right atrial pressure of 15 mmHg. IAS/Shunts: No atrial level shunt detected by color flow Doppler.  LEFT VENTRICLE PLAX 2D LVIDd:         5.30 cm  Diastology LVIDs:         3.10 cm  LV e' medial:    5.55 cm/s LV PW:         1.10  cm  LV E/e' medial:  15.6 LV IVS:        1.20 cm  LV e' lateral:   7.18 cm/s LVOT diam:     2.00 cm  LV E/e' lateral: 12.0 LV SV:         107 LV SV Index:   54 LVOT Area:     3.14 cm  RIGHT VENTRICLE             IVC RV Basal diam:  3.60 cm     IVC diam: 2.50 cm RV Mid diam:    2.40 cm RV S prime:     13.50 cm/s TAPSE (M-mode): 3.2 cm LEFT ATRIUM              Index       RIGHT ATRIUM           Index LA diam:        5.40 cm  2.70 cm/m  RA Area:     25.20 cm LA Vol (A2C):   109.0 ml 54.59 ml/m RA Volume:   86.40 ml  43.27 ml/m LA Vol (A4C):   55.1 ml  27.59 ml/m LA Biplane Vol: 75.6 ml  37.86 ml/m  AORTIC VALVE LVOT Vmax:   135.00 cm/s LVOT Vmean:  89.500 cm/s LVOT VTI:    0.342 m  AORTA Ao Root diam: 2.80 cm Ao Asc diam:  3.10 cm MITRAL VALVE               TRICUSPID VALVE MV Area (PHT): 3.77 cm    TR Peak grad:   23.0 mmHg MV Decel Time: 201 msec    TR Vmax:        240.00 cm/s MV E velocity: 86.40 cm/s MV A velocity: 80.40 cm/s  SHUNTS MV E/A ratio:  1.07        Systemic VTI:  0.34 m                            Systemic Diam: 2.00 cm Sanda Klein MD Electronically signed by Sanda Klein MD Signature Date/Time: 11/18/2020/5:05:30 PM    Final      Labs:   Basic Metabolic Panel: Recent Labs  Lab 11/17/20 2774 11/17/20 1287 11/18/20 0416 11/19/20 0120  NA 139  --  138 138  K 3.7   < > 3.9 3.9  CL 98  --  96* 94*  CO2 30  --  33* 32  GLUCOSE 84  --  162* 116*  BUN 7*  --  13 23  CREATININE 0.95  --  1.04* 1.04*  CALCIUM 9.2  --  8.8* 9.1  MG  --   --   --  2.2  PHOS  --   --   --  3.1   < > = values in this interval not displayed.   GFR Estimated Creatinine Clearance: 62.5 mL/min (A) (by C-G formula based on SCr of 1.04 mg/dL (H)). Liver Function Tests: Recent Labs  Lab 11/17/20 0833  AST 53*  ALT 30  ALKPHOS 18*  BILITOT 2.6*  PROT 6.3*  ALBUMIN 2.8*   No results for input(s): LIPASE, AMYLASE in the last 168 hours. No results for input(s): AMMONIA in the last 168  hours. Coagulation profile No results for input(s): INR, PROTIME in the last 168 hours.  CBC: Recent Labs  Lab 11/17/20 0833 11/18/20 0416 11/19/20 0120  WBC 7.9 7.5 9.1  NEUTROABS 6.3  --  7.1  HGB 16.5* 15.4* 15.2*  HCT 51.3* 47.1* 49.6*  MCV 91.1 91.5 93.6  PLT 147* 172 182   Cardiac Enzymes: No results for input(s): CKTOTAL, CKMB, CKMBINDEX, TROPONINI in the last 168 hours. BNP: Invalid input(s): POCBNP CBG: Recent Labs  Lab 11/18/20 2107 11/19/20 0618  GLUCAP 135* 119*   D-Dimer No results for input(s): DDIMER in the last 72 hours. Hgb A1c Recent Labs    11/18/20 0416  HGBA1C 5.2   Lipid Profile No results for input(s): CHOL, HDL, LDLCALC, TRIG, CHOLHDL, LDLDIRECT in the last 72 hours. Thyroid function studies Recent Labs    11/17/20 1600  TSH 1.240   Anemia work up No results for input(s): VITAMINB12, FOLATE, FERRITIN, TIBC, IRON, RETICCTPCT in the last 72 hours. Microbiology Recent Results (from the past 240 hour(s))  Resp Panel by RT-PCR (Flu A&B, Covid) Nasopharyngeal Swab     Status: None   Collection Time: 11/17/20  8:33 AM   Specimen: Nasopharyngeal Swab; Nasopharyngeal(NP) swabs in vial transport medium  Result Value Ref Range Status   SARS Coronavirus 2 by RT PCR NEGATIVE NEGATIVE Final    Comment: (NOTE) SARS-CoV-2 target nucleic acids are NOT DETECTED.  The SARS-CoV-2 RNA is generally detectable in upper respiratory specimens during the acute phase of infection. The lowest concentration of SARS-CoV-2 viral copies this assay can detect is 138 copies/mL. A negative result does not preclude SARS-Cov-2 infection and should not be used as the sole basis for treatment or other patient management decisions. A negative result may occur with  improper specimen collection/handling, submission of specimen other than nasopharyngeal swab, presence of viral mutation(s) within the areas targeted by this assay, and inadequate number of  viral copies(<138 copies/mL). A negative result must be combined with clinical observations, patient history, and epidemiological information. The expected result is Negative.  Fact Sheet for Patients:  EntrepreneurPulse.com.au  Fact Sheet for Healthcare Providers:  IncredibleEmployment.be  This test is no t yet approved or cleared by the [place] FDA and  has been authorized for detection and/or diagnosis of SARS-CoV-2 by FDA under an Emergency Use Authorization (EUA). This EUA will remain  in effect (meaning this test can be used) for the duration of the COVID-19 declaration under Section 564(b)(1) of the Act, 21 U.S.C.section 360bbb-3(b)(1), unless the authorization  is terminated  or revoked sooner.       Influenza A by PCR NEGATIVE NEGATIVE Final   Influenza B by PCR NEGATIVE NEGATIVE Final    Comment: (NOTE) The Xpert Xpress SARS-CoV-2/FLU/RSV plus assay is intended as an aid in the diagnosis of influenza from Nasopharyngeal swab specimens and should not be used as a sole basis for treatment. Nasal washings and aspirates are unacceptable for Xpert Xpress SARS-CoV-2/FLU/RSV testing.  Fact Sheet for Patients: EntrepreneurPulse.com.au  Fact Sheet for Healthcare Providers: IncredibleEmployment.be  This test is not yet approved or cleared by the [place] FDA and has been authorized for detection and/or diagnosis of SARS-CoV-2 by FDA under an Emergency Use Authorization (EUA). This EUA will remain in effect (meaning this test can be used) for the duration of the COVID-19 declaration under Section 564(b)(1) of the Act, 21 U.S.C. section 360bbb-3(b)(1), unless the authorization is terminated or revoked.  Performed at [facility], [place] [address], [place], [place] [address]      Signed: Terrilee Croak  [facility] 11/19/2020, 1:06 PM

## 2020-11-24 ENCOUNTER — Telehealth: Payer: Self-pay | Admitting: *Deleted

## 2020-11-24 NOTE — Telephone Encounter (Signed)
Left patient voice mail to give the research department a call back as soon as possible that way we can schedule her 1 week appointment for the [place] research study this week.

## 2020-11-24 NOTE — Research (Signed)
   Randomization Visit Worksheet    PATIENT ID: [id number]   DATE OF VISIT:  December 8th, 2021    1.  Confirmed all Inclusion Criteria have been met   [x]  Yes      []  No  2. Confirmed none of the Exclusion Criteria are present [x]  Yes       []  No   3. Vital Signs  Pulse: 61 bpm    Height: 66 []   cm  [x]  in (xxx.xx)  B/P: 150/78 mmHg  Body Weight: 89.9 [x]   kg  []  lbs  (xxx.xx)   4. HF Signs and Symptoms Assessed by:   5.    Concomitant medications  Enter current (i.e., at randomization) heart failure and diabetes medications in the eCRF Request details of vasoactive medications given during index hospitalization.    6.     Local Labs Performed Creatinine   1.04 mg/dl Date: 11/19/2020 Potassium   3.9 mmol/L Date: 11/19/2020 7.      Was KCCQ questionnaire completed?  [x]  Yes  []  No   8.       Was patient successfully randomized?  [x]  Yes  []  No     9.      Study Drug Dispensed  [x]  Dispense Study Drug in [facility]  Bottle number 1:  [id number]   Bottle number 2:  [id number]        [x]  Give first dose of study drug day of randomization   10.    Other Items  []  Schedule Week 1 Clinic Visit : patient couldn't plan visit yet until they got home to look at their schedule.         []  Schedule Month 1 Clinic Visit __________________________  [x]  Provide patient a copy of the signed ICF, patient ID card, patient medication instructions and          any other patient material  [x]  Remind patient of importance of complying with study medication        [x]  Remind patient to bring both study medication bottles (even if empty) to next clinic visit        [x]  Remind patient of importance of complying with study medication (1 pill per day)        [x]  Review Patient Contact Information Sheet, update if necessary             [x]  Complete Discharge Pages in eCRF after patient is discharged from Index Hospitalization   DAPA ACT HF-TIMI 68 Randomization Visit Worksheet Version 2.0  Date: 17 Jul 2020

## 2020-11-26 ENCOUNTER — Other Ambulatory Visit: Payer: Self-pay

## 2020-11-26 ENCOUNTER — Encounter: Payer: Medicare Other | Admitting: *Deleted

## 2020-11-26 DIAGNOSIS — Z006 Encounter for examination for normal comparison and control in clinical research program: Secondary | ICD-10-CM

## 2020-11-26 NOTE — Research (Addendum)
   Week 1 Visit Worksheet  PATIENT ID: [id number]   DATE OF CONTACT:  December 15th, 2021    Type of Visit*:   *Visit should be done in person whenever possible  [x]  Clinic Visit                         []  Telephone Contact with Patient     []  Family Member or Caregiver  []  Primary Care Physician   []  Medical Record Review                    []  Other (specify)____________  1. Did patient experience any AEs leading to study drug discontinuation, SAEs related to study drug, protocol-defined unexpected/unanticipated events, clinical endpoints (death, worsening HF), or adverse events of special interest since the last visit?  []     Yes*  [x]     No  *If yes, please record in [place] (eCRF)  2.   HF Signs and Symptoms Assessed by: Preston Fleeting, RN   3.     Vital Signs  Pulse: 68 bpm   Height: 66 []  cm  [x]   in (xxx.xx)  B/P: 141/58 mmHg  Body Weight: 190 []  kg   [x]   lbs  (xxx.xx)   4.     Concomitant medications Enter current heart failure and diabetes medications in the eCRF   5.    Is Patient currently taking study drug?  [x]  Yes    or   []  No*     *If no, describe reason:    6.   Date last tablet taken:  December 15th, 2021   Did patient bring bottles with them to visit? [x]  Yes []  No  Bottle number 1:  [id number]   Opened? [x]  Y   []  N Number of tablets remaining 28 Bottle number 2:  [id number]   Opened? []  Y   [x]  N Number of tablets remaining 35  How many days did patient NOT take the study drug since last visit?  1 Days  Investigator judgment of Compliance: [x]  Reasonable (? 20% deviation from expected)        []  Questionable (>20% deviation from expected)           1. 7.  Local Labs Performed Creatinine: 1.43 mg/dl  Date: 11/26/2020  Potassium: 4.0 mmol/L  Date: 11/26/2020  8.   Other Items  [x]  Schedule Month 1 Follow Up Visit : Wednesday, January 5th, 2022 @ 11:00 am   []  Schedule Month 2 Follow Up Visit __________________________  [x]  Remind patient of importance  of complying with study medication (1 pill per day)  [x]  Remind patient to bring both study medication bottles (even if empty) to next clinic visit  [x]  Review Patient Contact Information Sheet, update if necessary   DAPA ACT HF-TIMI 68 Week 1 Visit Worksheet       Version 2.0 Date 17 Jul 2020

## 2020-11-27 LAB — BASIC METABOLIC PANEL
BUN/Creatinine Ratio: 12 (ref 12–28)
BUN: 17 mg/dL (ref 8–27)
CO2: 27 mmol/L (ref 20–29)
Calcium: 9.4 mg/dL (ref 8.7–10.3)
Chloride: 94 mmol/L — ABNORMAL LOW (ref 96–106)
Creatinine, Ser: 1.43 mg/dL — ABNORMAL HIGH (ref 0.57–1.00)
GFR calc Af Amer: 45 mL/min/{1.73_m2} — ABNORMAL LOW (ref >59)
GFR calc non Af Amer: 39 mL/min/{1.73_m2} — ABNORMAL LOW (ref >59)
Glucose: 83 mg/dL (ref 65–99)
Potassium: 4 mmol/L (ref 3.5–5.2)
Sodium: 139 mmol/L (ref 134–144)

## 2020-12-17 ENCOUNTER — Telehealth: Payer: Self-pay | Admitting: *Deleted

## 2020-12-17 DIAGNOSIS — Z006 Encounter for examination for normal comparison and control in clinical research program: Secondary | ICD-10-CM

## 2020-12-17 NOTE — Telephone Encounter (Signed)
LVM to return call to [facility] Research at [phone] to reschedule today's missed appointment

## 2020-12-19 NOTE — Research (Signed)
Attempted 4th call with subject to reschedule DAPA 1 month appointment. Left VM asking for return call. Attempted to call subjects sister; phone number appears to no longer be connected. Will continue trying to get in contact with subject.

## 2020-12-24 ENCOUNTER — Other Ambulatory Visit: Payer: Self-pay

## 2020-12-24 DIAGNOSIS — Z006 Encounter for examination for normal comparison and control in clinical research program: Secondary | ICD-10-CM

## 2020-12-24 NOTE — Research (Addendum)
   Month 1 Visit Worksheet  PATIENT ID: [id number]   DATE OF CONTACT:  January 12th, 2022   Type of Visit*:   *Visit should be done in person whenever possible  [x]  Clinic Visit                         []  Telephone Contact with Patient     []  Family Member or Caregiver  []  Primary Care Physician   []  Medical Record Review                    []  Other (specify)____________  1. Did patient experience any AEs leading to study drug discontinuation, SAEs related to study drug, protocol-defined unexpected/unanticipated events, clinical endpoints (death, worsening HF), or adverse events of special interest since the last visit?  []     Yes*  []      No *If yes, please record in [place] (eCRF)  2.  HF Signs and Symptoms Assessed by: Griffin Dakin, RN   3.   Vital Signs  Pulse 69   Height 66 []  cm  [x]   in (xxx.xx)  B/P 161/76  Body Weight 189 []  kg   [x]   lbs  (xxx.xx)    4.   Concomitant medications Enter current heart failure and diabetes medications in the eCRF  5.   Is Patient currently taking study drug?  [x]  Yes    or   []  No*        *If no, describe reason:   6.   Study Drug Compliance  Date last tablet taken: January 12th, 2022 Did patient bring bottles with them to visit? [x]  Yes []  No  Bottle number 1: [id number]   Opened?  Yes  Number of tablets remaining 2 Bottle number 2:  [id number]   Opened?  No  Number of tablets remaining 35  How many days did patient NOT take the study drug since last visit?  2 Days  Investigator judgment of Compliance: [x]  Reasonable (? 20% deviation from expected)        []  Questionable (>20% deviation from expected   7.   Local Labs Performed Creatinine   0.89   units mg/dl Date 12/24/2020 Potassium   4.1 units mmol/L  Date 12/24/2020 Local Labs Performed  Creatinine  [x]  Yes  []  No Potassium  [x]   Yes []  No .    8.   Other Items  [x]  Schedule Month 2 Follow Up Visit 01/20/2021  [x]  Remind patient of importance of complying with study  medication (1 pill per day)  [x]  Remind patient to bring both study medication bottles (even if empty) to next clinic visit  [x]  Review Patient Contact Information Sheet, update if necessary             DAPA ACT HF-TIMI 68     Month 1 Visit Worksheet   Version 2.0 Date:  17 Jul 2020

## 2020-12-25 LAB — BASIC METABOLIC PANEL
BUN/Creatinine Ratio: 13 (ref 12–28)
BUN: 12 mg/dL (ref 8–27)
CO2: 23 mmol/L (ref 20–29)
Calcium: 9.8 mg/dL (ref 8.7–10.3)
Chloride: 96 mmol/L (ref 96–106)
Creatinine, Ser: 0.89 mg/dL (ref 0.57–1.00)
GFR calc Af Amer: 80 mL/min/{1.73_m2} (ref >59)
GFR calc non Af Amer: 69 mL/min/{1.73_m2} (ref >59)
Glucose: 108 mg/dL — ABNORMAL HIGH (ref 65–99)
Potassium: 4.1 mmol/L (ref 3.5–5.2)
Sodium: 133 mmol/L — ABNORMAL LOW (ref 134–144)

## 2021-01-25 DIAGNOSIS — F419 Anxiety disorder, unspecified: Secondary | ICD-10-CM | POA: Insufficient documentation

## 2021-01-25 DIAGNOSIS — E039 Hypothyroidism, unspecified: Secondary | ICD-10-CM | POA: Insufficient documentation

## 2021-01-25 DIAGNOSIS — F32A Depression, unspecified: Secondary | ICD-10-CM | POA: Insufficient documentation

## 2021-01-25 DIAGNOSIS — I10 Essential (primary) hypertension: Secondary | ICD-10-CM | POA: Insufficient documentation

## 2021-02-03 ENCOUNTER — Encounter: Payer: Medicare Other | Admitting: *Deleted

## 2021-02-03 ENCOUNTER — Other Ambulatory Visit: Payer: Self-pay

## 2021-02-03 DIAGNOSIS — Z006 Encounter for examination for normal comparison and control in clinical research program: Secondary | ICD-10-CM

## 2021-02-03 NOTE — Research (Signed)
Pt called and stated that she did not feel up to coming to the visit today. CRA notified and approved doing the visit via phone. Pt stated that she took the last dose of IP to day and that she would bring back the 2 bottles some time next week.      Month 2 Visit Worksheet  PATIENT ID: [id number]   DATE OF CONTACT:  22/feb/2022   Type of Visit:  (Visit should be done in person whenever possible)   []  Clinic Visit                         [x]  Telephone Contact with Patient     []  Family Member or Caregiver  []  Primary Care Physician  []  Medical Record Review   []  Other (specify)____________  1. Did patient experience any AEs leading to study drug discontinuation, SAEs related to study drug, protocol-defined unexpected/unanticipated events, clinical endpoints (death, worsening HF), or adverse events of special interest since the last visit?  [x]     Yes*  []      No *If yes, please record in [place] (eCRF)  2. HF Signs and Symptoms Assessed by: Star Age  3. Vital Signs: NOT DONE   Pulse _______________   Height ________________ []  cm []  in (xxx.xx)  B/P ________________  Body Weight __________ []  kg []  lbs  (xxx.xx)  4. Concomitant medications Enter current heart failure and diabetes medications in the eCRF  5. Is Patient currently taking study drug?  [x]  Yes    or   []  No*     *If no, describe reason:    6. Study Drug Compliance  Date last tablet taken: 22FEB2022  Did patient bring bottles with them to visit? []  Yes [x]  No  Bottle number 1:  __ __ __ __ __   Opened?  Y / N Number of tablets remaining ____ Bottle number 2:  __ __ __ __ __   Opened?  Y / N Number of tablets remaining ____  How many days did patient NOT take the study drug since last visit?  ___0_________Days  Investigator judgment of Compliance: [x]  Reasonable (? 20% deviation from expected)        []  Questionable (>20% deviation from expected)  7. Local Labs Performed Creatinine   0.91  units  mg/dl    Date:7/feb/2022 labs from [facility] hospitalization  Potassium   3.8   units mmol/L      Date: 17/FEB/2022  8. Was KCCQ questionnaire completed?  [x]  Yes  []  No   9. Other Items  [x]  Ensure the Adverse Events Question is answered in eCRF  [x]  Complete End of Study Visit Pages in eCRF, including date of last dose of study drug    DAPA ACT HF-TIMI 68 Month 2 Visit Worksheet      Version 2.0 Date: 17 Jul 2020

## 2021-06-25 ENCOUNTER — Other Ambulatory Visit: Payer: Self-pay | Admitting: Family

## 2021-06-25 DIAGNOSIS — Z1231 Encounter for screening mammogram for malignant neoplasm of breast: Secondary | ICD-10-CM

## 2021-06-30 ENCOUNTER — Ambulatory Visit
Admission: RE | Admit: 2021-06-30 | Discharge: 2021-06-30 | Disposition: A | Discharge status: Home / Self Care | Payer: Medicare Other | Source: Ambulatory Visit | Attending: Family | Admitting: Family

## 2021-06-30 ENCOUNTER — Other Ambulatory Visit: Payer: Self-pay

## 2021-06-30 DIAGNOSIS — Z1231 Encounter for screening mammogram for malignant neoplasm of breast: Secondary | ICD-10-CM

## 2021-10-01 ENCOUNTER — Other Ambulatory Visit (INDEPENDENT_AMBULATORY_CARE_PROVIDER_SITE_OTHER): Payer: Self-pay | Admitting: Nurse Practitioner

## 2021-10-01 DIAGNOSIS — I83813 Varicose veins of bilateral lower extremities with pain: Secondary | ICD-10-CM

## 2021-10-02 ENCOUNTER — Encounter (INDEPENDENT_AMBULATORY_CARE_PROVIDER_SITE_OTHER): Payer: Self-pay | Admitting: Nurse Practitioner

## 2021-10-02 ENCOUNTER — Encounter (INDEPENDENT_AMBULATORY_CARE_PROVIDER_SITE_OTHER): Payer: Self-pay

## 2021-10-20 ENCOUNTER — Ambulatory Visit (INDEPENDENT_AMBULATORY_CARE_PROVIDER_SITE_OTHER): Payer: Medicare Other | Admitting: Nurse Practitioner

## 2021-10-20 ENCOUNTER — Encounter (INDEPENDENT_AMBULATORY_CARE_PROVIDER_SITE_OTHER): Payer: Self-pay | Admitting: Nurse Practitioner

## 2021-10-20 ENCOUNTER — Other Ambulatory Visit: Payer: Self-pay

## 2021-10-20 VITALS — BP 149/78 | HR 88 | Ht 66.0 in | Wt 175.0 lb

## 2021-10-20 DIAGNOSIS — I8311 Varicose veins of right lower extremity with inflammation: Secondary | ICD-10-CM

## 2021-10-20 DIAGNOSIS — Z720 Tobacco use: Secondary | ICD-10-CM

## 2021-10-20 DIAGNOSIS — I8312 Varicose veins of left lower extremity with inflammation: Secondary | ICD-10-CM | POA: Diagnosis not present

## 2021-10-24 DIAGNOSIS — J441 Chronic obstructive pulmonary disease with (acute) exacerbation: Secondary | ICD-10-CM | POA: Diagnosis not present

## 2021-10-24 DIAGNOSIS — I2510 Atherosclerotic heart disease of native coronary artery without angina pectoris: Secondary | ICD-10-CM | POA: Diagnosis not present

## 2021-10-31 NOTE — Progress Notes (Signed)
Subjective:    Patient ID: [name], female    DOB: 21-Jul-1956, 65 y.o.   MRN: [id number] Chief Complaint  Patient presents with   New Patient (Initial Visit)    NP [place] BLE VV with pain. BLE recurrent ulcers. Failure of max venous treatment . BLE ven reflux    [name] presents as a new patient for follow-up after bilateral lower extremity endovenous ablation.  The patient had her endovenous ablations done at [facility].  Prior to the endovenous ablations the patient had significant ulcerations bilaterally but these have since healed.  Right great saphenous vein was treated on 07/30/2021 with a left on 07/03/2021.  Postoperative ultrasounds from [facility] show successful ablation of the bilateral great saphenous veins.   The patient note significant improvement in the lower extremity pain but not resolution of the symptoms. The patient notes multiple residual varicosities bilaterally which continued to hurt with dependent positions and remained tender to palpation. The patient's swelling is minimally from preoperative status. The patient continues to wear graduated compression stockings on a daily basis but these are not eliminating the pain and discomfort. The patient continues to use over-the-counter anti-inflammatory medications to treat the pain and related symptoms but this has not given the patient relief. The patient notes the pain in the lower extremities is causing problems with daily exercise, problems at work and even with household activities such as preparing meals and doing dishes.  The patient is otherwise done well and there have been no complications related to the laser procedure or interval changes in the patient's overall        Review of Systems  Cardiovascular:  Positive for leg swelling.  All other systems reviewed and are negative.     Objective:   Physical Exam Vitals reviewed.  HENT:     Head: Normocephalic.  Cardiovascular:     Rate and  Rhythm: Normal rate.     Pulses: Normal pulses.  Pulmonary:     Effort: Pulmonary effort is normal.  Skin:    General: Skin is warm and dry.  Neurological:     Mental Status: She is alert and oriented to person, place, and time.  Psychiatric:        Mood and Affect: Mood normal.        Behavior: Behavior normal.        Thought Content: Thought content normal.        Judgment: Judgment normal.    BP (!) 149/78   Pulse 88   Ht 5\' 6"  (1.676 m)   Wt 175 lb (79.4 kg)   BMI 28.25 kg/m   Past Medical History:  Diagnosis Date   (HFpEF) heart failure with preserved ejection fraction (HCC)    Acute respiratory distress 11/2020   Anxiety    Arthritis    Chronic respiratory failure with hypoxia and hypercapnia (HCC)    COPD (chronic obstructive pulmonary disease) (HCC)    no inhalers--smoker, no oxygen   Depression    Diabetes mellitus    no meds-diet controlled   Dyspnea    GERD (gastroesophageal reflux disease)    Hyperlipidemia    Hypertension    Hypothyroid    Restless leg syndrome    Seizures ([place])    02/13/2011 -hx of seizure due to "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  no seizure since--pt does take keppra and office  note from neurologist dr. Leta Baptist on this chart    Social History   Socioeconomic History   Marital status: Married    Spouse name: Not on file   Number of children: Not on file   Years of education: Not on file   Highest education level: Not on file  Occupational History   Not on file  Tobacco Use   Smoking status: Every Day    Packs/day: 1.00    Years: 48.00    Pack years: 48.00    Types: Cigarettes   Smokeless tobacco: Never  Vaping Use   Vaping Use: Former   Substances: Nicotine  Substance and Sexual Activity   Alcohol use: Not Currently   Drug use: No   Sexual activity: Not on file  Other Topics Concern   Not on file  Social  History Narrative   ** Merged History Encounter **       Social Determinants of Health   Financial Resource Strain: Not on file  Food Insecurity: No Food Insecurity   Worried About Charity fundraiser in the Last Year: Never true   [place] in the Last Year: Never true  Transportation Needs: No Transportation Needs   Lack of Transportation (Medical): No   Lack of Transportation (Non-Medical): No  Physical Activity: Not on file  Stress: Not on file  Social Connections: Not on file  Intimate Partner Violence: Not on file    Past Surgical History:  Procedure Laterality Date   CARPAL TUNNEL RELEASE     left   JOINT REPLACEMENT  08/26/2011    right total knee arthroplasty   KNEE ARTHROPLASTY  10/28/2011   Procedure: COMPUTER ASSISTED TOTAL KNEE ARTHROPLASTY;  Surgeon: Karen Chafe Rendall III;  Location: [facility];  Service: Orthopedics;  Laterality: Left;  preop femoral nerve block   KNEE SURGERY     right   laminectomy and diskectomy     L4-5 with fusion    Family History  Problem Relation Age of Onset   Heart attack Father 67   Alzheimer's disease Mother    Breast cancer Neg Hx     Allergies  Allergen Reactions   Acetaminophen Other (See Comments)    Kidney issues   Aspirin Other (See Comments)    Dr. Michela Pitcher do not take due to kidneys   Nsaids Other (See Comments)    Pt states it messes up her kidneys   Vicodin [Hydrocodone-Acetaminophen] Nausea And Vomiting   Buchu-[place]sil[place]-Hydran Other (See Comments)    CBC Latest Ref Rng & Units 11/19/2020 11/18/2020 11/17/2020  WBC 4.0 - 10.5 K/uL 9.1 7.5 7.9  Hemoglobin 12.0 - 15.0 g/dL 15.2(H) 15.4(H) 16.5(H)  Hematocrit 36.0 - 46.0 % 49.6(H) 47.1(H) 51.3(H)  Platelets 150 - 400 K/uL 182 172 147(L)      CMP     Component Value Date/Time   NA 133 (L) 12/24/2020 1117   K 4.1 12/24/2020 1117   CL 96 12/24/2020 1117   CO2 23 12/24/2020 1117   GLUCOSE 108 (H) 12/24/2020 1117   GLUCOSE 116 (H) 11/19/2020 0120   BUN  12 12/24/2020 1117   CREATININE 0.89 12/24/2020 1117   CALCIUM 9.8 12/24/2020 1117   PROT 6.3 (L) 11/17/2020 0833   ALBUMIN 2.8 (L) 11/17/2020 0833   AST 53 (H) 11/17/2020 0833   ALT 30 11/17/2020 0833   ALKPHOS 18 (L) 11/17/2020 0833   BILITOT 2.6 (H) 11/17/2020 0833   GFRNONAA 69 12/24/2020 1117   GFRNONAA >  60 11/19/2020 0120   GFRAA 80 12/24/2020 1117     No results found.     Assessment & Plan:   1. Varicose veins of both lower extremities with inflammation Recommend:  The patient has had successful ablation of the previously incompetent saphenous venous system but still has persistent symptoms of pain and swelling that are having a negative impact on daily life and daily activities.  Patient should undergo injection sclerotherapy to treat the residual varicosities.  The risks, benefits and alternative therapies were reviewed in detail with the patient.  All questions were answered.  The patient agrees to proceed with sclerotherapy at their convenience.  The patient will continue wearing the graduated compression stockings and using the over-the-counter pain medications to treat her symptoms.       2. Tobacco abuse Smoking cessation was discussed, 3-10 minutes spent on this topic specifically    Current Outpatient Medications on File Prior to Visit  Medication Sig Dispense Refill   albuterol (VENTOLIN HFA) 108 (90 Base) MCG/ACT inhaler Inhale 2 puffs into the lungs every 4 (four) hours as needed for wheezing or shortness of breath. 18 g 11   amLODipine (NORVASC) 5 MG tablet Take 5 mg by mouth daily.      ARIPiprazole (ABILIFY) 10 MG tablet Take 10 mg by mouth daily.      aspirin 81 MG EC tablet Take 81 mg by mouth daily.      busPIRone (BUSPAR) 30 MG tablet Take 30 mg by mouth 2 (two) times daily.     escitalopram (LEXAPRO) 20 MG tablet Take 20 mg by mouth every morning.      famotidine (PEPCID) 40 MG tablet Take 40 mg by mouth daily.     fenofibrate micronized  (LOFIBRA) 134 MG capsule Take 134 mg by mouth daily before breakfast.      gabapentin (NEURONTIN) 800 MG tablet Take 800 mg by mouth 3 (three) times daily.      hydrOXYzine (ATARAX/VISTARIL) 25 MG tablet Take 25 mg by mouth every 6 (six) hours as needed for anxiety or itching.      levETIRAcetam (KEPPRA) 500 MG tablet Take 1 tablet (500 mg total) by mouth 2 (two) times daily. 60 tablet 0   levothyroxine (SYNTHROID, LEVOTHROID) 25 MCG tablet Take 25 mcg by mouth daily before breakfast.      losartan (COZAAR) 100 MG tablet Take 100 mg by mouth daily.      pantoprazole (PROTONIX) 40 MG tablet Take 40 mg by mouth every morning.      pramipexole (MIRAPEX) 0.5 MG tablet Take 0.5 mg by mouth at bedtime.     predniSONE (DELTASONE) 10 MG tablet Take 4 tablets daily X 2 days, then, Take 3 tablets daily X 2 days, then, Take 2 tablets daily X 2 days, then, Take 1 tablets daily X 1 day. 19 tablet 0   SUBOXONE 8-2 MG FILM Place 1 each under the tongue daily.      SYMBICORT 160-4.5 MCG/ACT inhaler Inhale 2 puffs into the lungs daily.      umeclidinium-vilanterol (ANORO ELLIPTA) 62.5-25 MCG/INH AEPB Inhale 1 puff into the lungs daily. 1 each 11   Vitamin D, Ergocalciferol, (DRISDOL) 1.25 MG (50000 UNIT) CAPS capsule Take 50,000 Units by mouth once a week.     VYVANSE 20 MG capsule Take 20 mg by mouth daily.     furosemide (LASIX) 40 MG tablet Take 1 tablet (40 mg total) by mouth daily. May take additional 40 mg (one tablet)  as needed for swelling, weight gain or sob 30 tablet 0   Current Facility-Administered Medications on File Prior to Visit  Medication Dose Route Frequency Provider Last Rate Last Admin   chlorhexidine (HIBICLENS) 4 % liquid 4 application  60 mL Topical Once Duffy, Kellie Shropshire, PA-C       chlorhexidine (HIBICLENS) 4 % liquid 4 application  60 mL Topical Once Duffy, Kellie Shropshire, PA-C        There are no Patient Instructions on file for this visit. No follow-ups on file.   Kris Hartmann, NP

## 2021-11-16 DIAGNOSIS — K219 Gastro-esophageal reflux disease without esophagitis: Secondary | ICD-10-CM | POA: Diagnosis not present

## 2021-11-16 DIAGNOSIS — Z1211 Encounter for screening for malignant neoplasm of colon: Secondary | ICD-10-CM | POA: Diagnosis not present

## 2021-11-16 DIAGNOSIS — M13869 Other specified arthritis, unspecified knee: Secondary | ICD-10-CM | POA: Diagnosis not present

## 2021-11-16 DIAGNOSIS — I10 Essential (primary) hypertension: Secondary | ICD-10-CM | POA: Diagnosis not present

## 2021-11-16 DIAGNOSIS — G2581 Restless legs syndrome: Secondary | ICD-10-CM | POA: Diagnosis not present

## 2021-11-16 DIAGNOSIS — J449 Chronic obstructive pulmonary disease, unspecified: Secondary | ICD-10-CM | POA: Diagnosis not present

## 2021-11-23 DIAGNOSIS — I2510 Atherosclerotic heart disease of native coronary artery without angina pectoris: Secondary | ICD-10-CM | POA: Diagnosis not present

## 2021-11-23 DIAGNOSIS — J441 Chronic obstructive pulmonary disease with (acute) exacerbation: Secondary | ICD-10-CM | POA: Diagnosis not present

## 2021-11-29 DIAGNOSIS — J069 Acute upper respiratory infection, unspecified: Secondary | ICD-10-CM | POA: Diagnosis not present

## 2021-12-08 DIAGNOSIS — Z03818 Encounter for observation for suspected exposure to other biological agents ruled out: Secondary | ICD-10-CM | POA: Diagnosis not present

## 2021-12-08 DIAGNOSIS — N390 Urinary tract infection, site not specified: Secondary | ICD-10-CM | POA: Diagnosis not present

## 2021-12-08 DIAGNOSIS — J40 Bronchitis, not specified as acute or chronic: Secondary | ICD-10-CM | POA: Diagnosis not present

## 2021-12-08 DIAGNOSIS — R6889 Other general symptoms and signs: Secondary | ICD-10-CM | POA: Diagnosis not present

## 2021-12-08 DIAGNOSIS — R319 Hematuria, unspecified: Secondary | ICD-10-CM | POA: Diagnosis not present

## 2021-12-10 DIAGNOSIS — M79605 Pain in left leg: Secondary | ICD-10-CM | POA: Diagnosis not present

## 2021-12-10 DIAGNOSIS — I83892 Varicose veins of left lower extremities with other complications: Secondary | ICD-10-CM | POA: Diagnosis not present

## 2021-12-10 DIAGNOSIS — Z09 Encounter for follow-up examination after completed treatment for conditions other than malignant neoplasm: Secondary | ICD-10-CM | POA: Diagnosis not present

## 2021-12-16 ENCOUNTER — Other Ambulatory Visit: Payer: Self-pay

## 2021-12-16 ENCOUNTER — Ambulatory Visit
Admission: RE | Admit: 2021-12-16 | Discharge: 2021-12-16 | Disposition: A | Discharge status: Home / Self Care | Payer: 59 | Source: Ambulatory Visit | Attending: Internal Medicine | Admitting: Internal Medicine

## 2021-12-16 ENCOUNTER — Other Ambulatory Visit: Payer: Self-pay | Admitting: Internal Medicine

## 2021-12-16 ENCOUNTER — Ambulatory Visit
Admission: RE | Admit: 2021-12-16 | Discharge: 2021-12-16 | Disposition: A | Discharge status: Home / Self Care | Payer: 59 | Attending: Internal Medicine | Admitting: Internal Medicine

## 2021-12-16 DIAGNOSIS — R0902 Hypoxemia: Secondary | ICD-10-CM | POA: Diagnosis present

## 2021-12-16 DIAGNOSIS — J069 Acute upper respiratory infection, unspecified: Secondary | ICD-10-CM | POA: Diagnosis present

## 2022-01-08 NOTE — Research (Signed)
Question  # 1 on the Patient binder worksheet reflects that the answer "NO" should have been checked on the Epic source form; this question is also marked "NO" on the study EDC.  This was inadvertently left out of the Epic document.

## 2022-01-10 NOTE — Research (Signed)
DAPA subject:   [id number]         Visit:   4    Date: 02-03-2021  Please review this Research note and Cosign; make any comments as needed.

## 2022-01-10 NOTE — Research (Signed)
DAPA Subject:   [id number]    Visit: screen/ visit 1    Date of service: 11-19-2020  Please review this Research note and Cosign; Make any comments as needed.

## 2022-01-10 NOTE — Research (Signed)
DAPA Subject: [id number]      Visit:  consent process    Date of service: 11-19-2020  Please review this Research note and Cosign; Make any comments as needed.

## 2022-01-10 NOTE — Research (Signed)
DAPA Subject: [id number]      Visit:   2   Date of service: 11-16-2020  Please review this Research note and Cosign; Make any comments as needed.

## 2022-01-21 ENCOUNTER — Ambulatory Visit: Payer: 59 | Admitting: Internal Medicine

## 2022-03-04 ENCOUNTER — Emergency Department
Admission: EM | Admit: 2022-03-04 | Discharge: 2022-03-04 | Disposition: A | Discharge status: Home / Self Care | Payer: 59 | Attending: Emergency Medicine | Admitting: Emergency Medicine

## 2022-03-04 ENCOUNTER — Other Ambulatory Visit: Payer: Self-pay

## 2022-03-04 ENCOUNTER — Encounter: Payer: Self-pay | Admitting: Intensive Care

## 2022-03-04 DIAGNOSIS — I509 Heart failure, unspecified: Secondary | ICD-10-CM | POA: Insufficient documentation

## 2022-03-04 DIAGNOSIS — I110 Hypertensive heart disease with heart failure: Secondary | ICD-10-CM | POA: Diagnosis not present

## 2022-03-04 DIAGNOSIS — R32 Unspecified urinary incontinence: Secondary | ICD-10-CM | POA: Insufficient documentation

## 2022-03-04 DIAGNOSIS — M545 Low back pain, unspecified: Secondary | ICD-10-CM | POA: Diagnosis present

## 2022-03-04 DIAGNOSIS — K623 Rectal prolapse: Secondary | ICD-10-CM | POA: Insufficient documentation

## 2022-03-04 DIAGNOSIS — E119 Type 2 diabetes mellitus without complications: Secondary | ICD-10-CM | POA: Insufficient documentation

## 2022-03-04 DIAGNOSIS — R600 Localized edema: Secondary | ICD-10-CM | POA: Diagnosis not present

## 2022-03-04 DIAGNOSIS — J449 Chronic obstructive pulmonary disease, unspecified: Secondary | ICD-10-CM | POA: Diagnosis not present

## 2022-03-04 DIAGNOSIS — R609 Edema, unspecified: Secondary | ICD-10-CM

## 2022-03-04 DIAGNOSIS — N3000 Acute cystitis without hematuria: Secondary | ICD-10-CM | POA: Insufficient documentation

## 2022-03-04 HISTORY — DX: Emphysema, unspecified: J43.9

## 2022-03-04 LAB — URINALYSIS, ROUTINE W REFLEX MICROSCOPIC
Bacteria, UA: NONE SEEN
Bilirubin Urine: NEGATIVE
Glucose, UA: NEGATIVE mg/dL
Hgb urine dipstick: NEGATIVE
Ketones, ur: NEGATIVE mg/dL
Nitrite: NEGATIVE
Protein, ur: NEGATIVE mg/dL
Specific Gravity, Urine: 1.005 (ref 1.005–1.030)
WBC, UA: >50 WBC/hpf — ABNORMAL HIGH (ref 0–5)
pH: 5 (ref 5.0–8.0)

## 2022-03-04 MED ORDER — CEPHALEXIN 500 MG PO CAPS: 500.0000 mg | ORAL_CAPSULE | Freq: Three times a day (TID) | ORAL | 0 refills | Status: AC

## 2022-03-04 MED ORDER — TRAMADOL HCL 50 MG PO TABS
50.0000 mg | ORAL_TABLET | Freq: Four times a day (QID) | ORAL | 0 refills | Status: DC | PRN
Start: 2022-03-04 — End: 2022-03-10

## 2022-03-04 NOTE — Discharge Instructions (Addendum)
Please call and schedule appointments with specialists as listed above. ? ?Elevate your legs this evening and take 1 extra dose of Lasix today. ? ?Return to the ER for symptoms that change or worsen or for other concerns if unable to schedule an appointment. ?

## 2022-03-04 NOTE — ED Provider Notes (Signed)
? ?[facility] ?Provider Note ? ? ? Event Date/Time  ? First MD Initiated Contact with Patient 03/04/22 1407   ?  (approximate) ? ? ?History  ? ?Hemorrhoids and Back Pain ? ? ?HPI ? ?[name] is a 66 y.o. female with a history of COPD, hypertension, diabetes type 2, CHF and as listed in EMR presents to the emergency department for treatment and evaluation of a bulge from her rectum that has been intermittent for the past 3 weeks.  She is also having some bilateral lower back and hip pain.  She states that she has been unable to sleep due to the pain.  No injury.  ? ?  ? ? ?Physical Exam  ? ?Triage Vital Signs: ?ED Triage Vitals  ?Enc Vitals Group  ?   BP 03/04/22 1416 (!) 169/90  ?   Pulse Rate 03/04/22 1416 88  ?   Resp 03/04/22 1416 20  ?   Temp 03/04/22 1416 98 ?F (36.7 ?C)  ?   Temp Source 03/04/22 1416 Oral  ?   SpO2 03/04/22 1416 94 %  ?   Weight 03/04/22 1240 175 lb (79.4 kg)  ?   Height 03/04/22 1240 '5\' 6"'$  (1.676 m)  ?   Head Circumference --   ?   Peak Flow --   ?   Pain Score --   ?   Pain Loc --   ?   Pain Edu? --   ?   Excl. in [place]? --   ? ? ?Most recent vital signs: ?Vitals:  ? 03/04/22 1416  ?BP: (!) 169/90  ?Pulse: 88  ?Resp: 20  ?Temp: 98 ?F (36.7 ?C)  ?SpO2: 94%  ? ? ?General: Awake, no distress.  ?CV:  Good peripheral perfusion.  ?Resp:  Normal effort.  ?Abd:  No distention.  ?Other:  No CVA tenderness. No hemorrhoids. Decreased rectal tone. Rectal mucosa visualized at the anus, but not currently protruding.  ? ? ?ED Results / Procedures / Treatments  ? ?Labs ?(all labs ordered are listed, but only abnormal results are displayed) ?Labs Reviewed  ?URINALYSIS, ROUTINE W REFLEX MICROSCOPIC - Abnormal; Notable for the following components:  ?    Result Value  ? Color, Urine YELLOW (*)   ? APPearance HAZY (*)   ? Leukocytes,Ua LARGE (*)   ? WBC, UA >50 (*)   ? All other components within normal limits  ? ? ? ?EKG ? ? ? ? ?RADIOLOGY ? ?Image and radiology report reviewed by  me. ? ?Not indicated ? ?PROCEDURES: ? ?Critical Care performed: No ? ?Procedures ? ? ?MEDICATIONS ORDERED IN ED: ?Medications - No data to display ? ? ?IMPRESSION / MDM / ASSESSMENT AND PLAN / ED COURSE  ? ?I have reviewed the triage note. ? ?Differential diagnosis includes, but is not limited to: hemorrhoid, thrombosed hemorrhoid; rectal prolapse. ? ? ?66 year old female presents to the ER for treatment and evaluation of bulge from her rectum that has been intermittent for the past 3 weeks. Also complaining of back pain, hip pain, and bilateral leg pain. These symptoms are chronic, but worse over the past few days. She slept sitting up on the couch last night with her legs down and now has significant bilateral peripheral edema. She states this is "just something that happens sometimes." She denies shortness of breath or chest pain.  ? ?History of urinary incontinence and wears Depends. She has noticed increase in urinary frequency. Urinalysis consistent with UTI  which will be treated with Keflex. ? ?Outpatient records reviewed. She has an appointment for a colonoscopy with Dr. Alice Reichert in May. I touched base with GI on call, Dr. Barb Merino who advises that she keep that appointment as planned and will then follow with surgery.  ? ?Plan as above discussed with the patient who is agreeable to the plan. She was advised to take an extra lasix today due to asymptomatic peripheral edema, but was advised to return to the ER for any chest pain or change in degree of shortness of breath from baseline.  ?  ? ? ?FINAL CLINICAL IMPRESSION(S) / ED DIAGNOSES  ? ?Final diagnoses:  ?Acute cystitis without hematuria  ?Urinary incontinence, unspecified type  ?Peripheral edema  ?Incomplete rectal prolapse  ? ? ? ?Rx / DC Orders  ? ?ED Discharge Orders   ? ?      Ordered  ?  cephALEXin (KEFLEX) 500 MG capsule  3 times daily       ? 03/04/22 1551  ?  traMADol (ULTRAM) 50 MG tablet  Every 6 hours PRN       ? 03/04/22 1551  ? ?  ?  ? ?   ? ? ? ?Note:  This document was prepared using Dragon voice recognition software and may include unintentional dictation errors. ?  ?Victorino Dike, FNP ?03/05/22 1255 ? ?  ?Harvest Dark, MD ?03/05/22 1341 ? ?

## 2022-03-04 NOTE — ED Triage Notes (Signed)
Patient c/o "a ball hanging out of her rectum." Reports some blood when wiping and pain X3 weeks. Also c/o lower back pain that radiates down bilateral legs. Hx back pain. Denies recent injuries ?

## 2022-03-04 NOTE — ED Triage Notes (Signed)
FIRST NURSE NOTE:  ?Pt via POV from [facility]. Per staff at [facility] pt has a golf-ball sized hemorrhoid that has been there for 3 weeks. Pt has a colonoscopy scheduled in May. Pt c/o back pain but has chronic back pain. Pt is A&OX4 and NAD ?

## 2022-03-10 ENCOUNTER — Encounter: Payer: Self-pay | Admitting: Surgery

## 2022-03-10 ENCOUNTER — Ambulatory Visit (INDEPENDENT_AMBULATORY_CARE_PROVIDER_SITE_OTHER): Payer: 59 | Admitting: Surgery

## 2022-03-10 ENCOUNTER — Other Ambulatory Visit: Payer: Self-pay

## 2022-03-10 VITALS — BP 117/70 | HR 96 | Temp 98.3°F | Ht 66.5 in | Wt 187.6 lb

## 2022-03-10 DIAGNOSIS — K623 Rectal prolapse: Secondary | ICD-10-CM | POA: Diagnosis not present

## 2022-03-10 NOTE — Patient Instructions (Addendum)
A referral has been placed to Dr. Marcello Moores (Colorectal Surgeon) in [place]. They will call you for an appointment.  ? ?If you have any concerns or questions, please feel free to call our office.  ? ?Rectal Prolapse, Adult ?Rectal prolapse happens when the inside of the last section of the large intestine (rectum) drops down into an abnormal position. There are two types of rectal prolapse: ?Partial. In partial rectal prolapse, the inner lining of the rectum falls or sinks out of place and may stick out of the anus. ?Complete. In complete rectal prolapse, all layers of the rectum fall or sink out of place and may stick out of the anus. ?At first, rectal prolapse may be temporary. It may happen only when you are having a bowel movement. Over time, the prolapse will likely get worse. It may start to happen more often and cause uncomfortable symptoms. Eventually, the prolapse may happen when you are walking or standing. Surgery is often needed for this condition. ?What are the causes? ?This condition may be caused by weakness in the muscles that attach the rectum to the inside of the lower abdomen. The exact cause of this muscle weakness is often not known. ?What increases the risk? ?You are more likely to develop this condition if you: ?Have a history of chronic constipation or diarrhea. ?Frequently strain to have a bowel movement. ?Are a woman who is 65 years of age or older. ?Have a neurological disorder, such as multiple sclerosis, or lower spinal cord injury. ?Are a woman who has been pregnant many times. ?Have had pelvic or rectal surgery. ?Have cystic fibrosis. ?What are the signs or symptoms? ?The main symptom of this condition is a red mass of tissue sticking out of your anus. The mass may appear inflamed, have mucus, or bleed slightly. ?At first, the mass may only appear after a bowel movement. ?The mass may then start to appear more often. ?Other symptoms may include: ?Discomfort in the anus and  rectum. ?Constipation. ?Feeling that stool is not completely emptied from the rectum. ?Diarrhea. ?Leaking of stool, mucus, or blood from the anus (fecal incontinence). ?Feeling like you are sitting on a ball. ?How is this diagnosed? ?This condition may be diagnosed based on your symptoms and a physical exam. ?During the exam, you may be asked to squat and strain as though you are having a bowel movement. ?You may also have tests, such as: ?A rectal exam using a flexible scope (sigmoidoscopy or colonoscopy). ?A procedure that involves taking X-rays of your rectum after a dye (contrast material) is injected into the rectum (defecogram). ?How is this treated? ?This condition is usually treated with surgery to repair the weakened muscles and to reconnect the rectum to attachments inside the lower abdomen. Other treatment options may include: ?Pushing the prolapsed area back into the rectum (reduction). ?Your health care provider may do this by gently pushing it back in using a moist cloth. ?The health care provider may show you how to do this at home if the prolapse occurs again. ?Medicines to prevent constipation and straining. This may include laxatives or stool softeners. ?Follow these instructions at home: ?General instructions ? ?Take over-the-counter and prescription medicines only as told by your health care provider. ?Do not strain to have a bowel movement. ?Do not lift anything that is heavier than 10 lb (4.5 kg), or the limit that you are told, until your health care provider says that it is safe. ?Follow instructions from your health care provider about  what to do if the prolapse occurs again and does not go back in. This may involve lying on your side and using a moist cloth to gently press the lump into your rectum. ?Keep all follow-up visits as told by your health care provider. This is important. ?Preventing constipation ?To prevent or treat constipation, your health care provider may recommend that  you: ?Drink enough fluid to keep your urine pale yellow. ?Take over-the-counter or prescription medicines. ?Eat foods that are high in fiber, such as beans, whole grains, and fresh fruits and vegetables. ?Limit foods that are high in fat and processed sugars, such as fried or sweet foods. ? ?Contact a health care provider if: ?You have a fever. ?Your prolapse cannot be reduced at home. ?You have constipation or diarrhea. ?You have mild rectal bleeding. ?Get help right away if: ?You have very bad rectal pain. ?You bleed heavily from your rectum. ?Summary ?Rectal prolapse happens when the inside of the rectum drops down into an abnormal position. In some cases, the rectum may partially or completely push out through the anal opening. ?This condition is usually treated with surgery to repair the weakened muscles and to reconnect the rectum to attachments inside the lower abdomen. ?Take over-the-counter and prescription medicines only as told by your health care provider. ?Follow instructions from your health care provider about what to do if the prolapse occurs again and does not go back in. ?Get help right away if you have very bad rectal pain or if you bleed heavily from your rectum. ?This information is not intended to replace advice given to you by your health care provider. Make sure you discuss any questions you have with your health care provider. ?Document Revised: 06/25/2021 Document Reviewed: 06/25/2021 ?Elsevier Patient Education ? [place]. ? ?

## 2022-03-12 ENCOUNTER — Encounter: Payer: Self-pay | Admitting: Surgery

## 2022-03-12 NOTE — Progress Notes (Signed)
Patient ID: [name], female   DOB: 24-Jul-1956, 66 y.o.   MRN: [id number] ? ?HPI ?[name] is a 66 y.o. female  ?: Seen in consultation at the request of Dr. Lanny Cramp rectal prolapse..  She does have a complex medical history to include  ?COPD, CHF, CAD DM, chronic pain. Smokes 1 PPD ?SHe does have significant history of anxiety, depression and chronic pain ?He states that she has chronic abdominal pain and perirectal pain.  Pain is intermittent worsening when having a bowel movement.  Mild to moderate intensity and sharp she also states that her rectum protrudes.  She does have chronic incontinence for both urine and feces. ?She had 1 vaginal delivery in the remote past with episiotomy.  No other major pelvic floor trauma.  SHe did have a CXR ?Pers reviewed w COPD changes ?CBC and CMP were normal except hypercapnia alkalosis. ?She has had e back surgeries with spinal fusion ? ? ? ?HPI ? ?Past Medical History:  ?Diagnosis Date  ? (HFpEF) heart failure with preserved ejection fraction ([place])   ? Acute respiratory distress 11/2020  ? Anxiety   ? Arthritis   ? Chronic respiratory failure with hypoxia and hypercapnia (HCC)   ? COPD (chronic obstructive pulmonary disease) ([place])   ? no inhalers--smoker, no oxygen  ? Depression   ? Diabetes mellitus   ? no meds-diet controlled  ? Dyspnea   ? Emphysema lung ([place])   ? GERD (gastroesophageal reflux disease)   ? Hyperlipidemia   ? Hypertension   ? Hypothyroid   ? Restless leg syndrome   ? Seizures ([place])   ? 02/13/2011 -hx of seizure due to "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  no seizure since--pt does take keppra and office note from neurologist dr. Leta Baptist on this chart  ? ? ?Past Surgical History:  ?Procedure Laterality Date  ? CARPAL TUNNEL RELEASE    ? left  ? JOINT REPLACEMENT  08/26/2011   ? right total knee arthroplasty  ? KNEE ARTHROPLASTY  10/28/2011  ?  Procedure: COMPUTER ASSISTED TOTAL KNEE ARTHROPLASTY;  Surgeon: Karen Chafe Rendall III;  Location: [facility];  Service: Orthopedics;  Laterality: Left;  preop femoral nerve block  ? KNEE SURGERY    ? right  ? laminectomy and diskectomy    ? L4-5 with fusion  ? ? ?Family History  ?Problem Relation Age of Onset  ? Heart attack Father 7  ? Alzheimer's disease Mother   ? Breast cancer Neg Hx   ? ? ?Social History ?Social History  ? ?Tobacco Use  ? Smoking status: Every Day  ?  Packs/day: 1.00  ?  Years: 48.00  ?  Pack years: 48.00  ?  Types: Cigarettes  ? Smokeless tobacco: Never  ?Vaping Use  ? Vaping Use: Every day  ? Substances: Nicotine  ?Substance Use Topics  ? Alcohol use: Yes  ?  Comment: occ  ? Drug use: No  ? ? ?Allergies  ?Allergen Reactions  ? Acetaminophen Other (See Comments)  ?  Kidney issues  ? Aspirin Other (See Comments)  ?  Dr. Michela Pitcher do not take due to kidneys  ? Nsaids Other (See Comments)  ?  Pt states it messes up her kidneys  ? Vicodin [Hydrocodone-Acetaminophen] Nausea And Vomiting  ? [place]-Hydran Other (See Comments)  ? ? ?Current Outpatient Medications  ?Medication Sig Dispense Refill  ?  albuterol (VENTOLIN HFA) 108 (90 Base) MCG/ACT inhaler Inhale 2 puffs into the lungs every 4 (four) hours as needed for wheezing or shortness of breath. 18 g 11  ? amLODipine (NORVASC) 5 MG tablet Take 5 mg by mouth daily.     ? ARIPiprazole (ABILIFY) 10 MG tablet Take 10 mg by mouth daily.     ? aspirin 81 MG EC tablet Take 81 mg by mouth daily.     ? busPIRone (BUSPAR) 30 MG tablet Take 30 mg by mouth 2 (two) times daily.    ? escitalopram (LEXAPRO) 20 MG tablet Take 20 mg by mouth every morning.     ? famotidine (PEPCID) 40 MG tablet Take 40 mg by mouth daily.    ? fenofibrate micronized (LOFIBRA) 134 MG capsule Take 134 mg by mouth daily before breakfast.     ? Fluticasone-Umeclidin-Vilant (TRELEGY ELLIPTA) 100-62.5-25 MCG/ACT AEPB USE AS DIRECTED 1 INHALATION DAILY    ? furosemide (LASIX) 40 MG  tablet Take 1 tablet (40 mg total) by mouth daily. May take additional 40 mg (one tablet) as needed for swelling, weight gain or sob 30 tablet 0  ? gabapentin (NEURONTIN) 800 MG tablet Take 800 mg by mouth 3 (three) times daily.     ? hydrOXYzine (ATARAX/VISTARIL) 25 MG tablet Take 25 mg by mouth every 6 (six) hours as needed for anxiety or itching.     ? levETIRAcetam (KEPPRA) 500 MG tablet Take 1 tablet (500 mg total) by mouth 2 (two) times daily. 60 tablet 0  ? levothyroxine (SYNTHROID, LEVOTHROID) 25 MCG tablet Take 25 mcg by mouth daily before breakfast.     ? losartan (COZAAR) 100 MG tablet Take 100 mg by mouth daily.     ? nortriptyline (PAMELOR) 75 MG capsule Take 75 mg by mouth at bedtime.    ? pantoprazole (PROTONIX) 40 MG tablet Take 40 mg by mouth every morning.     ? Phendimetrazine Tartrate 35 MG TABS Take 1 tablet by mouth 3 (three) times daily.    ? umeclidinium-vilanterol (ANORO ELLIPTA) 62.5-25 MCG/INH AEPB Inhale 1 puff into the lungs daily. 1 each 11  ? ?No current facility-administered medications for this visit.  ? ?Facility-Administered Medications Ordered in Other Visits  ?Medication Dose Route Frequency Provider Last Rate Last Admin  ? chlorhexidine (HIBICLENS) 4 % liquid 4 application  60 mL Topical Once Duffy, Kellie Shropshire, PA-C      ? chlorhexidine (HIBICLENS) 4 % liquid 4 application  60 mL Topical Once Duffy, Kellie Shropshire, PA-C      ? ? ? ?Review of Systems ?Full ROS  was asked and was negative except for the information on the HPI ? ?Physical Exam ?Blood pressure 117/70, pulse 96, temperature 98.3 ?F (36.8 ?C), temperature source Oral, height 5' 6.5" (1.689 m), weight 187 lb 9.6 oz (85.1 kg), SpO2 95 %. ? ? ?Physical Exam ?Vitals and nursing note reviewed. Exam conducted with a chaperone present.  ?Constitutional:   ?   General: She is not in acute distress. ?   Appearance: Normal appearance. She is normal weight.  ?   Comments: Chronically ill malnourished  ?Cardiovascular:  ?   Rate and Rhythm:  Normal rate and regular rhythm.  ?   Heart sounds: No murmur heard. ?Pulmonary:  ?   Effort: Pulmonary effort is normal. No respiratory distress.  ?   Breath sounds: Normal breath sounds. No stridor. No wheezing.  ?Abdominal:  ?   General: Abdomen is flat. There is no distension.  ?  Palpations: Abdomen is soft. There is no mass.  ?   Tenderness: There is no abdominal tenderness. There is no guarding or rebound.  ?   Hernia: No hernia is present.  ?Genitourinary: ?   Comments: Exam performed there is a circumferential prolapse worsening with Valsalva.  It is reducible.  There is no rectal masses and there is no good muscle tone within the pelvic floor. ?Musculoskeletal:     ?   General: No swelling. Normal range of motion.  ?   Cervical back: Normal range of motion and neck supple. No rigidity or tenderness.  ?Skin: ?   General: Skin is warm and dry.  ?   Capillary Refill: Capillary refill takes less than 2 seconds.  ?   Coloration: Skin is not jaundiced.  ?Neurological:  ?   Mental Status: She is alert.  ?Psychiatric:     ?   Mood and Affect: Mood normal.     ?   Thought Content: Thought content normal.     ?   Judgment: Judgment normal.  ? ? ? ? ?Data Reviewed ? ?I have personally reviewed the patient's imaging, laboratory findings and medical records.   ? ?Assessment ? ?66 year old female with rectal prolapse + incontinence of urine and feces.  Very difficult and complex  situation.  Discussed with the patient in detail about her disease process.  Encouraged her to seek a second opinion from colorectal surgery.  They may be able to evaluate the pelvic floor function and review them in more detail, and establish whether or not they can perform rectal prolapse surgery without potential ostomy still retained continence. ? we will place referral for colorectal surgery at [facility].  I Was very candid with her and I would be very concerned about any surgical intervention short of a resection with end colostomy  + colpopexy given her pelvic floor dysfunction. ?SHe does have significant chronic pain and also significant chronic GI issues that may things even more complicated. ?He wishes to follow-up with colorect

## 2022-03-18 ENCOUNTER — Telehealth: Payer: Self-pay | Admitting: Surgery

## 2022-03-18 NOTE — Telephone Encounter (Signed)
Left a message for the patient to call the office patient needs an appt with Dr. Dahlia Byes  ?

## 2022-03-22 ENCOUNTER — Telehealth: Payer: Self-pay | Admitting: Surgery

## 2022-03-22 NOTE — Telephone Encounter (Signed)
Patient last saw Dr. Dahlia Byes on 3/296/23 for rectal prolapse.  She can't see Dr Johney Maine until 4/25 and is asking if Dr. Dahlia Byes can call something in for her pain.  Please call her, thank you.  ?

## 2022-04-06 ENCOUNTER — Encounter: Payer: Self-pay | Admitting: Surgery

## 2022-04-06 DIAGNOSIS — N183 Chronic kidney disease, stage 3 unspecified: Secondary | ICD-10-CM | POA: Insufficient documentation

## 2022-04-06 DIAGNOSIS — G2581 Restless legs syndrome: Secondary | ICD-10-CM | POA: Insufficient documentation

## 2022-04-06 DIAGNOSIS — R569 Unspecified convulsions: Secondary | ICD-10-CM | POA: Insufficient documentation

## 2022-04-06 DIAGNOSIS — I5030 Unspecified diastolic (congestive) heart failure: Secondary | ICD-10-CM | POA: Insufficient documentation

## 2022-04-06 DIAGNOSIS — N179 Acute kidney failure, unspecified: Secondary | ICD-10-CM | POA: Insufficient documentation

## 2022-04-06 DIAGNOSIS — K219 Gastro-esophageal reflux disease without esophagitis: Secondary | ICD-10-CM | POA: Insufficient documentation

## 2022-04-06 DIAGNOSIS — K30 Functional dyspepsia: Secondary | ICD-10-CM | POA: Insufficient documentation

## 2022-04-06 DIAGNOSIS — R1111 Vomiting without nausea: Secondary | ICD-10-CM | POA: Insufficient documentation

## 2022-04-23 ENCOUNTER — Encounter: Admission: RE | Payer: Self-pay | Source: Home / Self Care

## 2022-04-23 ENCOUNTER — Ambulatory Visit: Admission: RE | Admit: 2022-04-23 | Payer: 59 | Source: Home / Self Care

## 2022-04-23 SURGERY — COLONOSCOPY WITH PROPOFOL
Anesthesia: General

## 2022-05-03 ENCOUNTER — Ambulatory Visit: Payer: 59 | Admitting: Urology

## 2022-05-03 ENCOUNTER — Encounter: Payer: Self-pay | Admitting: Urology

## 2022-05-05 ENCOUNTER — Other Ambulatory Visit: Payer: Self-pay | Admitting: Internal Medicine

## 2022-05-05 DIAGNOSIS — Z1231 Encounter for screening mammogram for malignant neoplasm of breast: Secondary | ICD-10-CM

## 2022-05-20 ENCOUNTER — Ambulatory Visit: Payer: 59 | Admitting: Urology

## 2022-05-27 ENCOUNTER — Ambulatory Visit (INDEPENDENT_AMBULATORY_CARE_PROVIDER_SITE_OTHER): Payer: 59 | Admitting: Urology

## 2022-05-27 VITALS — BP 139/74 | HR 90 | Ht 66.0 in | Wt 184.0 lb

## 2022-05-27 DIAGNOSIS — R8271 Bacteriuria: Secondary | ICD-10-CM

## 2022-05-27 DIAGNOSIS — N390 Urinary tract infection, site not specified: Secondary | ICD-10-CM | POA: Diagnosis not present

## 2022-05-27 DIAGNOSIS — N3942 Incontinence without sensory awareness: Secondary | ICD-10-CM | POA: Diagnosis not present

## 2022-05-27 DIAGNOSIS — N3946 Mixed incontinence: Secondary | ICD-10-CM | POA: Diagnosis not present

## 2022-05-27 LAB — URINALYSIS, COMPLETE
Bilirubin, UA: NEGATIVE
Glucose, UA: NEGATIVE
Ketones, UA: NEGATIVE
Nitrite, UA: NEGATIVE
Protein,UA: NEGATIVE
RBC, UA: NEGATIVE
Specific Gravity, UA: 1.01 (ref 1.005–1.030)
Urobilinogen, Ur: 0.2 mg/dL (ref 0.2–1.0)
pH, UA: 5.5 (ref 5.0–7.5)

## 2022-05-27 LAB — MICROSCOPIC EXAMINATION: WBC, UA: >30 /hpf — AB (ref 0–5)

## 2022-05-27 LAB — BLADDER SCAN AMB NON-IMAGING: Scan Result: 280

## 2022-05-27 MED ORDER — GEMTESA 75 MG PO TABS: 75.0000 mg | ORAL_TABLET | Freq: Every day | ORAL | 0 refills | Status: DC

## 2022-05-27 NOTE — Progress Notes (Signed)
05/27/2022 2:07 PM   Gustavus Messing 1956/08/29 [id number]  Referring provider: Jodi Marble, MD [place],  [place] [address]  Chief Complaint  Patient presents with   Urinary Tract Infection    HPI: [name] is a 66 y.o. female   PMH: Past Medical History:  Diagnosis Date   (HFpEF) heart failure with preserved ejection fraction ([place])    Acute respiratory distress 11/2020   Anxiety    Arthritis    Chronic respiratory failure with hypoxia and hypercapnia (HCC)    COPD (chronic obstructive pulmonary disease) ([place])    no inhalers--smoker, no oxygen   Depression    Diabetes mellitus    no meds-diet controlled   Dyspnea    Emphysema lung (HCC)    GERD (gastroesophageal reflux disease)    Hyperlipidemia    Hypertension    Hypothyroid    Restless leg syndrome    Seizures ([place])    02/13/2011 -hx of seizure due to "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  no seizure since--pt does take keppra and office note from neurologist dr. Leta Baptist on this chart    Surgical History: Past Surgical History:  Procedure Laterality Date   CARPAL TUNNEL RELEASE     left   JOINT REPLACEMENT  08/26/2011    right total knee arthroplasty   KNEE ARTHROPLASTY  10/28/2011   Procedure: COMPUTER ASSISTED TOTAL KNEE ARTHROPLASTY;  Surgeon: Karen Chafe Rendall III;  Location: [facility];  Service: Orthopedics;  Laterality: Left;  preop femoral nerve block   KNEE SURGERY     right   laminectomy and diskectomy     L4-5 with fusion    Home Medications:  Allergies as of 05/27/2022       Reactions   Acetaminophen Other (See Comments)   Kidney issues   Aspirin Other (See Comments)   Dr. Michela Pitcher do not take due to kidneys   Nsaids Other (See Comments)   Pt states it messes up her kidneys   Vicodin [hydrocodone-acetaminophen] Nausea And Vomiting   Buchu-cornsilk-ch Grass-hydran  Other (See Comments)   Thiazide-type Diuretics    Other reaction(s): Unknown        Medication List        Accurate as of May 27, 2022  2:07 PM. If you have any questions, ask your nurse or doctor.          albuterol 108 (90 Base) MCG/ACT inhaler Commonly known as: VENTOLIN HFA Inhale 2 puffs into the lungs every 4 (four) hours as needed for wheezing or shortness of breath.   amLODipine 5 MG tablet Commonly known as: NORVASC Take 5 mg by mouth daily.   Anoro Ellipta 62.5-25 MCG/ACT Aepb Generic drug: umeclidinium-vilanterol Inhale 1 puff into the lungs daily.   ARIPiprazole 10 MG tablet Commonly known as: ABILIFY Take 10 mg by mouth daily.   aspirin EC 81 MG tablet Take 81 mg by mouth daily.   busPIRone 30 MG tablet Commonly known as: BUSPAR Take 30 mg by mouth 2 (two) times daily.   cetirizine 10 MG tablet Commonly known as: ZYRTEC Take 10 mg by mouth daily.   escitalopram 20 MG tablet Commonly known as: LEXAPRO Take 20 mg by mouth every morning.   famotidine 40 MG tablet Commonly known as: PEPCID Take 40 mg by mouth daily.   fenofibrate micronized 134 MG capsule Commonly known as: LOFIBRA  Take 134 mg by mouth daily before breakfast.   furosemide 40 MG tablet Commonly known as: LASIX Take 1 tablet (40 mg total) by mouth daily. May take additional 40 mg (one tablet) as needed for swelling, weight gain or sob   gabapentin 800 MG tablet Commonly known as: NEURONTIN Take 800 mg by mouth 3 (three) times daily.   hydrOXYzine 25 MG tablet Commonly known as: ATARAX Take 25 mg by mouth every 6 (six) hours as needed for anxiety or itching.   levETIRAcetam 500 MG tablet Commonly known as: KEPPRA Take 1 tablet (500 mg total) by mouth 2 (two) times daily.   levothyroxine 25 MCG tablet Commonly known as: SYNTHROID Take 25 mcg by mouth daily before breakfast.   losartan 100 MG tablet Commonly known as: COZAAR Take 100 mg by mouth daily.   Matzim LA  240 MG 24 hr tablet Generic drug: diltiazem Take 240 mg by mouth daily.   metoCLOPramide 5 MG tablet Commonly known as: REGLAN Take 1 orally PO TID 30 minutes before meals for 30 day(s)   nortriptyline 75 MG capsule Commonly known as: PAMELOR Take 75 mg by mouth at bedtime.   pantoprazole 40 MG tablet Commonly known as: PROTONIX Take 40 mg by mouth every morning.   Phendimetrazine Tartrate 35 MG Tabs Take 1 tablet by mouth 3 (three) times daily.   pramipexole 0.5 MG tablet Commonly known as: MIRAPEX Take 0.5 mg by mouth at bedtime.   Symbicort 160-4.5 MCG/ACT inhaler Generic drug: budesonide-formoterol SMARTSIG:2 Puff(s) By Mouth Twice Daily   Trelegy Ellipta 100-62.5-25 MCG/ACT Aepb Generic drug: Fluticasone-Umeclidin-Vilant USE AS DIRECTED 1 INHALATION DAILY   varenicline 1 MG tablet Commonly known as: CHANTIX Take 1 mg by mouth every morning.        Allergies:  Allergies  Allergen Reactions   Acetaminophen Other (See Comments)    Kidney issues   Aspirin Other (See Comments)    Dr. Michela Pitcher do not take due to kidneys   Nsaids Other (See Comments)    Pt states it messes up her kidneys   Vicodin [Hydrocodone-Acetaminophen] Nausea And Vomiting   Buchu-Cornsilk-Ch Grass-Hydran Other (See Comments)   Thiazide-Type Diuretics     Other reaction(s): Unknown    Family History: Family History  Problem Relation Age of Onset   Heart attack Father 19   Alzheimer's disease Mother    Breast cancer Neg Hx     Social History:  reports that she has been smoking cigarettes. She has a 48.00 pack-year smoking history. She has never used smokeless tobacco. She reports current alcohol use. She reports that she does not use drugs.   Physical Exam: There were no vitals taken for this visit.  Constitutional:  Alert and oriented, No acute distress. HEENT: [place] AT, moist mucus membranes.  Trachea midline, no masses. Cardiovascular: No clubbing, cyanosis, or edema. Respiratory:  Normal respiratory effort, no increased work of breathing. GI: Abdomen is soft, nontender, nondistended, no abdominal masses GU: No CVA tenderness Skin: No rashes, bruises or suspicious lesions. Neurologic: Grossly intact, no focal deficits, moving all 4 extremities. Psychiatric: Normal mood and affect.  Laboratory Data: Lab Results  Component Value Date   WBC 9.1 11/19/2020   HGB 15.2 (H) 11/19/2020   HCT 49.6 (H) 11/19/2020   MCV 93.6 11/19/2020   PLT 182 11/19/2020    Lab Results  Component Value Date   CREATININE 0.89 12/24/2020    No results found for: "PSA"  No results found for: "TESTOSTERONE"  Lab  Results  Component Value Date   HGBA1C 5.2 11/18/2020    Urinalysis    Component Value Date/Time   COLORURINE YELLOW (A) 03/04/2022 1513   APPEARANCEUR HAZY (A) 03/04/2022 1513   LABSPEC 1.005 03/04/2022 1513   PHURINE 5.0 03/04/2022 1513   GLUCOSEU NEGATIVE 03/04/2022 1513   HGBUR NEGATIVE 03/04/2022 1513   BILIRUBINUR NEGATIVE 03/04/2022 1513   KETONESUR NEGATIVE 03/04/2022 1513   PROTEINUR NEGATIVE 03/04/2022 1513   UROBILINOGEN 0.2 04/14/2014 1217   NITRITE NEGATIVE 03/04/2022 1513   LEUKOCYTESUR LARGE (A) 03/04/2022 1513    Lab Results  Component Value Date   BACTERIA NONE SEEN 03/04/2022    Pertinent Imaging: *** No results found for this or any previous visit.  No results found for this or any previous visit.  No results found for this or any previous visit.  No results found for this or any previous visit.  No results found for this or any previous visit.  No results found for this or any previous visit.  No results found for this or any previous visit.  No results found for this or any previous visit.   Assessment & Plan:    1. Recurrent UTI *** - Urinalysis, Complete   No follow-ups on file.  Abbie Sons, [place] [address], [place], [place] [address] [phone]

## 2022-05-29 ENCOUNTER — Encounter: Payer: Self-pay | Admitting: Urology

## 2022-05-31 LAB — CULTURE, URINE COMPREHENSIVE

## 2022-06-02 ENCOUNTER — Other Ambulatory Visit (HOSPITAL_COMMUNITY): Payer: Self-pay

## 2022-06-03 ENCOUNTER — Telehealth (INDEPENDENT_AMBULATORY_CARE_PROVIDER_SITE_OTHER): Payer: Self-pay | Admitting: Vascular Surgery

## 2022-06-03 NOTE — Telephone Encounter (Signed)
Pt. States she want to have her neck ultrasound before her surgery on 06/18/22.  Please advise

## 2022-06-03 NOTE — Telephone Encounter (Signed)
This patient doesn't have any neck ultrasound scheduled and we have never seen her for such.  Are we sure we have the correct patient? If this is the correct patient, we need further information.

## 2022-06-04 ENCOUNTER — Telehealth (INDEPENDENT_AMBULATORY_CARE_PROVIDER_SITE_OTHER): Payer: Self-pay | Admitting: Nurse Practitioner

## 2022-06-04 NOTE — Progress Notes (Addendum)
COVID Vaccine received:  []  No [x]  Yes X 4  Date of any COVID positive Test in last 90 days: no  PCP - Gillermo Murdoch, MD   [facility]    [address]. [place]  [phone]  Cardiologist -Arnoldo Hooker, MD   [facility]   Cardiac Clearance in 04-26-22 note in [facility] Pulmonology-  Ned Clines, MD     Pulmonary clearance in 04-22-22 note in [facility] Vascular Surgery- Clotilde Dieter, MD Urology - Irineo Axon, MD  Chest x-ray - 12-16-21 Epic EKG -  04-26-22 [facility] report only Stress Test - 07-2018  Epic ECHO - 11-18-20  Epic Cardiac Cath -   Pacemaker/ICD device last checked: Date:       []  N/A Spinal Cord Stimulator:[]  No []  Yes   Other Implants:   Bowel Prep - No surgeon notes available for her Colon resection on 06-18-22 at PST appt. These have been requested but there are orders for the colonoscopy in Epic.  According to patient, she is to stay on clear liquid diet from the colonoscopy until she comes in for her colon resection the next day.   History of Sleep Apnea? []  No [x]  Yes  []  unknown Sleep Study Date:  01-17-2020  Epic CPAP used?- [x]  No []  Yes  (Instruct to bring their mask & Tubing)  Does the patient monitor blood sugar? [x]  No []  Yes  []  N/A Does patient have a [employer] or Dexacom? []  No []  Yes   Fasting Blood Sugar Ranges-  Checks Blood Sugar _____ times a day  Blood Thinner Instructions: Aspirin Instructions:  Aspirin 81 mg  Last Dose: patient stopped ASA on 06-03-2022  Activity level:  Can go up a flight of stairs and perform activities of daily living without stopping and without symptoms of chest pain or shortness of breath.[x]  No []  Yes  []  N/A  Able to do exercises without symptoms [x]  No []  Yes  []  N/A  Patient unable to complete ADLs without assistance [x]  No []  Yes  []  N/A    Comments:   Anesthesia review: HFpEF, Hx seizure (Last one after knee surgery about 12 years ago  Patient denies shortness of breath, fever, cough  and chest pain at PAT appointment  Patient verbalized understanding and agreement to the Pre-Surgical Instructions that were given to them at this PAT appointment. Patient was also educated of the need to review these PAT instructions again prior to his/her surgery.I reviewed the appropriate phone numbers to call if they have any and questions or concerns.

## 2022-06-07 ENCOUNTER — Encounter (HOSPITAL_COMMUNITY): Payer: Self-pay

## 2022-06-07 ENCOUNTER — Other Ambulatory Visit: Payer: Self-pay

## 2022-06-07 ENCOUNTER — Encounter (HOSPITAL_COMMUNITY)
Admission: RE | Admit: 2022-06-07 | Discharge: 2022-06-07 | Disposition: A | Discharge status: Home / Self Care | Payer: 59 | Source: Ambulatory Visit | Attending: Surgery | Admitting: Surgery

## 2022-06-07 VITALS — BP 126/79 | HR 75 | Temp 99.1°F | Resp 22 | Ht 66.0 in | Wt 184.0 lb

## 2022-06-07 DIAGNOSIS — Z01818 Encounter for other preprocedural examination: Secondary | ICD-10-CM

## 2022-06-07 DIAGNOSIS — R7303 Prediabetes: Secondary | ICD-10-CM | POA: Diagnosis not present

## 2022-06-07 DIAGNOSIS — R768 Other specified abnormal immunological findings in serum: Secondary | ICD-10-CM

## 2022-06-07 DIAGNOSIS — Z01812 Encounter for preprocedural laboratory examination: Secondary | ICD-10-CM | POA: Insufficient documentation

## 2022-06-07 HISTORY — DX: Prediabetes: R73.03

## 2022-06-07 HISTORY — DX: Peripheral vascular disease, unspecified: I73.9

## 2022-06-07 HISTORY — DX: Heart failure, unspecified: I50.9

## 2022-06-07 HISTORY — DX: Other specified abnormal immunological findings in serum: R76.8

## 2022-06-07 LAB — CBC
HCT: 40.3 % (ref 36.0–46.0)
Hemoglobin: 13.3 g/dL (ref 12.0–15.0)
MCH: 32.1 pg (ref 26.0–34.0)
MCHC: 33 g/dL (ref 30.0–36.0)
MCV: 97.3 fL (ref 80.0–100.0)
Platelets: 314 10*3/uL (ref 150–400)
RBC: 4.14 MIL/uL (ref 3.87–5.11)
RDW: 14.4 % (ref 11.5–15.5)
WBC: 7.5 10*3/uL (ref 4.0–10.5)
nRBC: 0 % (ref 0.0–0.2)

## 2022-06-07 LAB — BASIC METABOLIC PANEL
Anion gap: 9 (ref 5–15)
BUN: 27 mg/dL — ABNORMAL HIGH (ref 8–23)
CO2: 27 mmol/L (ref 22–32)
Calcium: 9.2 mg/dL (ref 8.9–10.3)
Chloride: 97 mmol/L — ABNORMAL LOW (ref 98–111)
Creatinine, Ser: 1.8 mg/dL — ABNORMAL HIGH (ref 0.44–1.00)
GFR, Estimated: 31 mL/min — ABNORMAL LOW (ref >60)
Glucose, Bld: 91 mg/dL (ref 70–99)
Potassium: 4 mmol/L (ref 3.5–5.1)
Sodium: 133 mmol/L — ABNORMAL LOW (ref 135–145)

## 2022-06-07 LAB — GLUCOSE, CAPILLARY: Glucose-Capillary: 105 mg/dL — ABNORMAL HIGH (ref 70–99)

## 2022-06-07 NOTE — Consult Note (Signed)
WOC Nurse requested for preoperative stoma site marking  Discussed surgical procedure and stoma creation with patient.   Explained role of the WOC nurse team.  Provided the patient with educational booklet and provided samples of pouching options.  Answered patient questions, no family present.   Examined patient sitting and standing in order to place the marking in the patient's visual field, away from any creases or abdominal contour issues and within the rectus muscle.  Attempted to mark below the patient's belt line, but unable d/t to pts pannus and abdominal creases, which should be avoided if possible.   Marked for colostomy in the LLQ  _8___ cm to the left of the umbilicus and __5__cm above the umbilicus.  Marked for ileostomy in the RLQ  __8__cm to the right of the umbilicus and  _4___ cm above the umbilicus.   Patient's abdomen cleansed with CHG wipes at site markings, allowed to air dry prior to marking.Covered mark with thin film transparent dressing to preserve mark until date of surgery. Provided pt with marking pen and to remark if mark begins to fade prior to surgery.   WOC Nurse team will follow up with patient after surgery for continued ostomy care and teaching.   Student WOC- Leslye Peer, RN, [facility] Cammie Mcgee MSN, RN, [place], [place], CNS [phone]

## 2022-06-07 NOTE — Telephone Encounter (Signed)
Left a message on patient voicemail to return call to office pertaining to not below

## 2022-06-08 ENCOUNTER — Other Ambulatory Visit (HOSPITAL_COMMUNITY): Payer: Self-pay

## 2022-06-08 NOTE — Progress Notes (Signed)
Patient's T&S came back as Positive for antibodies so I placed a repeat order for DOS. Also, The Hgb A1C, that was ordered at PST, was cancelled by someone; I called the lab and they could not give me any answer as to who or why this was cancelled. I have placed another order for the A1C to be obtained DOS.

## 2022-06-09 NOTE — Progress Notes (Signed)
Anesthesia Chart Review   Case: [id number] Date/Time: 06/18/22 0715   Procedures:      ROBOTIC RESECTION OF COLON, RECTOSIGMOID, RECTOPEXY     POSSIBLE OSTOMY     RIGID PROCTOSCOPY   Anesthesia type: General   Pre-op diagnosis: RECTAL PROLAPSE   [facility] / [facility]   Surgeons: Michael Boston, MD       DISCUSSION:66 y.o. smoker with h/o GERD, COPD, HTN, CHF, rectal prolapse scheduled for above procedure 06/18/2022 with Dr. Michael Boston.   Pt last seen by cardiology 04/26/2022. Per OV note, "Proceed to surgery and/or invasive procedure without restriction to pre or post operative and/or procedural care. The patient is at lowest risk possible for cardiovascular complications with surgical intervention and/or invasive procedure. Currently has no evidence active and/or significant angina and/or congestive heart failure."  Pt last seen by pulmonology 04/13/2022. Per OV note, "stage II copd, still smoking, advised to quit. Dyspnea is better. Has to pace self. pulmonary wise cleared for the reactal prolapse corrective surgery."  Anticipate pt can proceed with planned procedure barring acute status change.   VS: There were no vitals taken for this visit.  PROVIDERS: Jodi Marble, MD is PCP   Serafina Royals, MD is Cardiologist  LABS: Labs reviewed: Acceptable for surgery. (all labs ordered are listed, but only abnormal results are displayed)  Labs Reviewed - No data to display   IMAGES:   EKG:   CV: Echo 11/18/2020 1. Left ventricular ejection fraction, by estimation, is 60 to 65%. The  left ventricle has normal function. The left ventricle has no regional  wall motion abnormalities. Left ventricular diastolic parameters are  consistent with Grade II diastolic  dysfunction (pseudonormalization). Elevated left atrial pressure.   2. Right ventricular systolic function is normal. The right ventricular  size is normal. There is mildly elevated pulmonary artery systolic   pressure.   3. Left atrial size was severely dilated.   4. Right atrial size was severely dilated.   5. The mitral valve is normal in structure. Mild mitral valve  regurgitation.   6. The aortic valve is normal in structure. Aortic valve regurgitation is  not visualized. Mild aortic valve sclerosis is present, with no evidence  of aortic valve stenosis.   7. The inferior vena cava is dilated in size with <50% respiratory  variability, suggesting right atrial pressure of 15 mmHg.   Myocardial Perfusion 08/08/2018 The left ventricular ejection fraction is normal (55-65%). Nuclear stress EF: 64%. There was no ST segment deviation noted during stress. Defect 1: There is a small defect of mild severity present in the mid anteroseptal location. The study is normal. This is a low risk study.   Normal stress nuclear study with mild soft tissue attenuation but no ischemia.  Gated ejection fraction 64% with normal wall motion.   Past Medical History:  Diagnosis Date   (HFpEF) heart failure with preserved ejection fraction (HCC)    Acute respiratory distress 11/2020   Anxiety    Arthritis    CHF (congestive heart failure) (HCC)    Chronic respiratory failure with hypoxia and hypercapnia (HCC)    COPD (chronic obstructive pulmonary disease) (HCC)    no inhalers--smoker, no oxygen   Depression    Dyspnea    Emphysema lung (HCC)    GERD (gastroesophageal reflux disease)    Hyperlipidemia    Hypertension    Hypothyroid    Peripheral vascular disease (HCC)    Pre-diabetes    Restless  leg syndrome    Seizures ([place])    02/13/2011 -hx of seizure due to "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  no seizure since--pt does take keppra and office note from neurologist dr. Leta Baptist on this chart    Past Surgical History:  Procedure Laterality Date   CARPAL TUNNEL RELEASE     left   JOINT  REPLACEMENT  08/26/2011   right total knee arthroplasty, left total knee   KNEE ARTHROPLASTY  10/28/2011   Procedure: COMPUTER ASSISTED TOTAL KNEE ARTHROPLASTY;  Surgeon: Karen Chafe Rendall III;  Location: [facility];  Service: Orthopedics;  Laterality: Left;  preop femoral nerve block   KNEE SURGERY     right   laminectomy and diskectomy     L4-5 with fusion    MEDICATIONS: No current facility-administered medications for this encounter.    acidophilus (RISAQUAD) CAPS capsule   albuterol (VENTOLIN HFA) 108 (90 Base) MCG/ACT inhaler   amLODipine (NORVASC) 5 MG tablet   aspirin 81 MG EC tablet   busPIRone (BUSPAR) 30 MG tablet   cetirizine (ZYRTEC) 10 MG tablet   escitalopram (LEXAPRO) 20 MG tablet   famotidine (PEPCID) 40 MG tablet   fenofibrate micronized (LOFIBRA) 134 MG capsule   fluticasone (FLONASE) 50 MCG/ACT nasal spray   Fluticasone-Umeclidin-Vilant (TRELEGY ELLIPTA) 100-62.5-25 MCG/ACT AEPB   furosemide (LASIX) 40 MG tablet   gabapentin (NEURONTIN) 800 MG tablet   ibuprofen (ADVIL) 200 MG tablet   levETIRAcetam (KEPPRA) 500 MG tablet   levothyroxine (SYNTHROID, LEVOTHROID) 25 MCG tablet   loperamide (IMODIUM) 2 MG capsule   losartan (COZAAR) 100 MG tablet   Menthol, Topical Analgesic, (BIOFREEZE) 10 % CREA   Multiple Vitamins-Minerals (CENTRUM SILVER 50+WOMEN) TABS   OXYGEN   pantoprazole (PROTONIX) 40 MG tablet   pramipexole (MIRAPEX) 0.5 MG tablet   Vibegron (GEMTESA) 75 MG TABS   umeclidinium-vilanterol (ANORO ELLIPTA) 62.5-25 MCG/INH AEPB    chlorhexidine (HIBICLENS) 4 % liquid 4 application   chlorhexidine (HIBICLENS) 4 % liquid 4 application    Lonie Newsham Ward, PA-C [facility] [phone]

## 2022-06-17 ENCOUNTER — Encounter: Payer: Self-pay | Admitting: *Deleted

## 2022-06-17 ENCOUNTER — Ambulatory Visit: Payer: 59 | Admitting: Physician Assistant

## 2022-06-17 ENCOUNTER — Ambulatory Visit
Admission: RE | Admit: 2022-06-17 | Discharge: 2022-06-17 | Disposition: A | Discharge status: Home / Self Care | Payer: 59 | Source: Ambulatory Visit | Attending: Gastroenterology | Admitting: Gastroenterology

## 2022-06-17 ENCOUNTER — Ambulatory Visit: Payer: 59 | Admitting: Certified Registered"

## 2022-06-17 ENCOUNTER — Encounter
Admission: RE | Disposition: A | Discharge status: Home / Self Care | Payer: Self-pay | Source: Ambulatory Visit | Attending: Gastroenterology

## 2022-06-17 ENCOUNTER — Encounter (HOSPITAL_COMMUNITY): Payer: Self-pay | Admitting: Surgery

## 2022-06-17 DIAGNOSIS — D128 Benign neoplasm of rectum: Secondary | ICD-10-CM | POA: Insufficient documentation

## 2022-06-17 DIAGNOSIS — I110 Hypertensive heart disease with heart failure: Secondary | ICD-10-CM | POA: Insufficient documentation

## 2022-06-17 DIAGNOSIS — R569 Unspecified convulsions: Secondary | ICD-10-CM | POA: Insufficient documentation

## 2022-06-17 DIAGNOSIS — K219 Gastro-esophageal reflux disease without esophagitis: Secondary | ICD-10-CM | POA: Insufficient documentation

## 2022-06-17 DIAGNOSIS — F32A Depression, unspecified: Secondary | ICD-10-CM | POA: Diagnosis not present

## 2022-06-17 DIAGNOSIS — I5032 Chronic diastolic (congestive) heart failure: Secondary | ICD-10-CM | POA: Diagnosis not present

## 2022-06-17 DIAGNOSIS — Z1211 Encounter for screening for malignant neoplasm of colon: Secondary | ICD-10-CM | POA: Insufficient documentation

## 2022-06-17 DIAGNOSIS — I739 Peripheral vascular disease, unspecified: Secondary | ICD-10-CM | POA: Insufficient documentation

## 2022-06-17 DIAGNOSIS — D124 Benign neoplasm of descending colon: Secondary | ICD-10-CM | POA: Diagnosis not present

## 2022-06-17 DIAGNOSIS — D125 Benign neoplasm of sigmoid colon: Secondary | ICD-10-CM | POA: Insufficient documentation

## 2022-06-17 DIAGNOSIS — J439 Emphysema, unspecified: Secondary | ICD-10-CM | POA: Insufficient documentation

## 2022-06-17 DIAGNOSIS — F17200 Nicotine dependence, unspecified, uncomplicated: Secondary | ICD-10-CM | POA: Insufficient documentation

## 2022-06-17 DIAGNOSIS — F419 Anxiety disorder, unspecified: Secondary | ICD-10-CM | POA: Insufficient documentation

## 2022-06-17 DIAGNOSIS — E039 Hypothyroidism, unspecified: Secondary | ICD-10-CM | POA: Insufficient documentation

## 2022-06-17 DIAGNOSIS — R32 Unspecified urinary incontinence: Secondary | ICD-10-CM | POA: Diagnosis not present

## 2022-06-17 DIAGNOSIS — G2581 Restless legs syndrome: Secondary | ICD-10-CM | POA: Insufficient documentation

## 2022-06-17 HISTORY — DX: Unspecified urinary incontinence: R32

## 2022-06-17 HISTORY — PX: COLONOSCOPY WITH PROPOFOL: SHX5780

## 2022-06-17 LAB — GLUCOSE, CAPILLARY: Glucose-Capillary: 153 mg/dL — ABNORMAL HIGH (ref 70–99)

## 2022-06-17 SURGERY — COLONOSCOPY WITH PROPOFOL
Anesthesia: General

## 2022-06-17 MED ORDER — DEXMEDETOMIDINE (PRECEDEX) IN NS 20 MCG/5ML (4 MCG/ML) IV SYRINGE
PREFILLED_SYRINGE | INTRAVENOUS | Status: DC | PRN
  Administered 2022-06-17: 8 ug via INTRAVENOUS
  Administered 2022-06-17: 12 ug via INTRAVENOUS

## 2022-06-17 MED ORDER — PROPOFOL 10 MG/ML IV BOLUS
INTRAVENOUS | Status: DC | PRN
  Administered 2022-06-17: 10 mg via INTRAVENOUS
  Administered 2022-06-17: 20 mg via INTRAVENOUS
  Administered 2022-06-17: 70 mg via INTRAVENOUS

## 2022-06-17 MED ORDER — SODIUM CHLORIDE 0.9 % IV SOLN: INTRAVENOUS | Status: DC

## 2022-06-17 MED ORDER — LIDOCAINE HCL (CARDIAC) PF 100 MG/5ML IV SOSY
PREFILLED_SYRINGE | INTRAVENOUS | Status: DC | PRN
  Administered 2022-06-17: 100 mg via INTRAVENOUS

## 2022-06-17 MED ORDER — PHENYLEPHRINE 80 MCG/ML (10ML) SYRINGE FOR IV PUSH (FOR BLOOD PRESSURE SUPPORT)
PREFILLED_SYRINGE | INTRAVENOUS | Status: DC | PRN
  Administered 2022-06-17 (×2): 80 ug via INTRAVENOUS

## 2022-06-17 MED ORDER — PROPOFOL 1000 MG/100ML IV EMUL
INTRAVENOUS | Status: AC
  Filled 2022-06-17: qty 100

## 2022-06-17 MED ORDER — PROPOFOL 500 MG/50ML IV EMUL
INTRAVENOUS | Status: DC | PRN
  Administered 2022-06-17: 150 ug/kg/min via INTRAVENOUS

## 2022-06-17 NOTE — OR Nursing (Signed)
Epic Down.  Paper charting used in Post-op

## 2022-06-17 NOTE — Anesthesia Preprocedure Evaluation (Addendum)
Anesthesia Evaluation  Patient identified by MRN, date of birth, ID band Patient awake    Reviewed: Allergy & Precautions, NPO status , Patient's Chart, lab work & pertinent test results, reviewed documented beta blocker date and time   Airway Mallampati: II  TM Distance: >3 FB Neck ROM: Full    Dental  (+) Edentulous Lower, Edentulous Upper   Pulmonary shortness of breath, with exertion and Long-Term Oxygen Therapy, pneumonia, resolved, COPD,  COPD inhaler, Current SmokerPatient did not abstain from smoking.,    breath sounds clear to auscultation + decreased breath sounds      Cardiovascular Exercise Tolerance: Poor hypertension, Pt. on medications + Peripheral Vascular Disease and +CHF  Normal cardiovascular exam Rhythm:Regular Rate:Normal  Echo 11/18/20 1. Left ventricular ejection fraction, by estimation, is 60 to 65%. The left ventricle has normal function. The left ventricle has no regional wall motion abnormalities. Left ventricular diastolic parameters are consistent with Grade II diastolic dysfunction (pseudonormalization). Elevated left atrial pressure.  2. Right ventricular systolic function is normal. The right ventricular size is normal. There is mildly elevated pulmonary artery systolic pressure.  3. Left atrial size was severely dilated.  4. Right atrial size was severely dilated.  5. The mitral valve is normal in structure. Mild mitral valve  regurgitation.  6. The aortic valve is normal in structure. Aortic valve regurgitation is not visualized. Mild aortic valve sclerosis is present, with no evidence of aortic valve stenosis.  7. The inferior vena cava is dilated in size with <50% respiratory variability, suggesting right atrial pressure of 15 mmHg.   EKG 11/17/20 NSR, Borderline RAD   Neuro/Psych Seizures -, Well Controlled,  PSYCHIATRIC DISORDERS Anxiety Depression Myofascial pain syndrome Restless legs  syndrome Last Sz 2012- narcotic withdrawl  Neuromuscular disease    GI/Hepatic Neg liver ROS, GERD  Medicated and Controlled,Rectal Prolapse   Endo/Other  diabetes, Well Controlled, Type 2Hypothyroidism Hyperlipidemia  Renal/GU Renal InsufficiencyRenal disease Bladder dysfunction  Urinary incontinence    Musculoskeletal  (+) Arthritis , Osteoarthritis,  Failed back syndrome Chronic low back and knee pain   Abdominal   Peds  Hematology negative hematology ROS (+)   Anesthesia Other Findings   Reproductive/Obstetrics                         Anesthesia Physical Anesthesia Plan  ASA: 3  Anesthesia Plan: General   Post-op Pain Management: Dilaudid IV, Precedex and Ketamine IV*   Induction:   PONV Risk Score and Plan: 4 or greater and Treatment may vary due to age or medical condition, Ondansetron and Midazolam  Airway Management Planned: Oral ETT  Additional Equipment: None  Intra-op Plan:   Post-operative Plan: Extubation in OR  Informed Consent: I have reviewed the patients History and Physical, chart, labs and discussed the procedure including the risks, benefits and alternatives for the proposed anesthesia with the patient or authorized representative who has indicated his/her understanding and acceptance.     Dental advisory given  Plan Discussed with: CRNA and Anesthesiologist  Anesthesia Plan Comments:        Anesthesia Quick Evaluation

## 2022-06-17 NOTE — H&P (Signed)
Outpatient short stay form Pre-procedure 06/17/2022  Rachel Rubenstein, MD  Primary Physician: Waylan Rocher, MD  Reason for visit:  Screening colonoscopy  History of present illness:    66 y/o lady with history of hypothyroidism, COPD, and hypertension here for colonoscopy prior to rectal prolapse repair tomorrow. No blood thinners. No family history of GI malignancies. No significant abdominal surgeries.    Current Facility-Administered Medications:    0.9 %  sodium chloride infusion, , Intravenous, Continuous, Pedrohenrique [place], Hilton Cork, MD  Facility-Administered Medications Ordered in Other Encounters:    chlorhexidine (HIBICLENS) 4 % liquid 4 application, 60 mL, Topical, Once, Duffy, Kellie Shropshire, PA-C   chlorhexidine (HIBICLENS) 4 % liquid 4 application, 60 mL, Topical, Once, Duffy, Kellie Shropshire, PA-C  Medications Prior to Admission  Medication Sig Dispense Refill Last Dose   albuterol (VENTOLIN HFA) 108 (90 Base) MCG/ACT inhaler Inhale 2 puffs into the lungs every 4 (four) hours as needed for wheezing or shortness of breath. 18 g 11 06/17/2022   amLODipine (NORVASC) 5 MG tablet Take 7.5 mg by mouth daily.   06/17/2022   ARIPiprazole (ABILIFY) 10 MG tablet Take 10 mg by mouth daily.      furosemide (LASIX) 40 MG tablet Take 1 tablet (40 mg total) by mouth daily. May take additional 40 mg (one tablet) as needed for swelling, weight gain or sob 30 tablet 0 06/17/2022   levothyroxine (SYNTHROID, LEVOTHROID) 25 MCG tablet Take 25 mcg by mouth daily before breakfast.    06/17/2022   OXYGEN Inhale 3 L into the lungs at bedtime.   06/16/2022   pantoprazole (PROTONIX) 40 MG tablet Take 40 mg by mouth every morning.    06/16/2022   acidophilus (RISAQUAD) CAPS capsule Take 1 capsule by mouth daily.      aspirin 81 MG EC tablet Take 81 mg by mouth daily.    06/11/2022   busPIRone (BUSPAR) 30 MG tablet Take 30 mg by mouth 2 (two) times daily.      cetirizine (ZYRTEC) 10 MG tablet Take 10 mg by mouth daily as needed  for allergies or rhinitis.      escitalopram (LEXAPRO) 20 MG tablet Take 30 mg by mouth daily.      famotidine (PEPCID) 40 MG tablet Take 40 mg by mouth at bedtime.      fenofibrate micronized (LOFIBRA) 134 MG capsule Take 134 mg by mouth daily before breakfast.       fluticasone (FLONASE) 50 MCG/ACT nasal spray Place 1 spray into both nostrils daily as needed for allergies or rhinitis.      Fluticasone-Umeclidin-Vilant (TRELEGY ELLIPTA) 100-62.5-25 MCG/ACT AEPB Inhale 1 puff into the lungs daily.      gabapentin (NEURONTIN) 800 MG tablet Take 800 mg by mouth 3 (three) times daily.       ibuprofen (ADVIL) 200 MG tablet Take 600 mg by mouth every 6 (six) hours as needed for moderate pain.      levETIRAcetam (KEPPRA) 500 MG tablet Take 1 tablet (500 mg total) by mouth 2 (two) times daily. (Patient taking differently: Take 500 mg by mouth daily.) 60 tablet 0    loperamide (IMODIUM) 2 MG capsule Take 12 mg by mouth as needed for diarrhea or loose stools.      losartan (COZAAR) 100 MG tablet Take 100 mg by mouth daily.       Menthol, Topical Analgesic, (BIOFREEZE) 10 % CREA Apply 1 Application topically daily as needed (pain).      Multiple Vitamins-Minerals (CENTRUM SILVER  50+WOMEN) TABS Take 1 tablet by mouth daily.      pramipexole (MIRAPEX) 0.5 MG tablet Take 0.5 mg by mouth at bedtime.      umeclidinium-vilanterol (ANORO ELLIPTA) 62.5-25 MCG/INH AEPB Inhale 1 puff into the lungs daily. (Patient not taking: Reported on 06/01/2022) 1 each 11    Vibegron (GEMTESA) 75 MG TABS Take 75 mg by mouth daily. 30 tablet 0      Allergies  Allergen Reactions   Acetaminophen Other (See Comments)    Kidney issues   Aspirin Other (See Comments)    Dr. Michela Pitcher do not take due to kidneys   Nsaids Other (See Comments)    Pt states it messes up her kidneys   Vicodin [Hydrocodone-Acetaminophen] Nausea And Vomiting   Buchu-[place]sil[place]-Hydran Other (See Comments)   Thiazide-Type Diuretics     Other  reaction(s): Unknown     Past Medical History:  Diagnosis Date   (HFpEF) heart failure with preserved ejection fraction (HCC)    Acute respiratory distress 11/2020   Anxiety    Arthritis    CHF (congestive heart failure) (HCC)    Chronic respiratory failure with hypoxia and hypercapnia (HCC)    COPD (chronic obstructive pulmonary disease) (HCC)    no inhalers--smoker, no oxygen   Depression    Dyspnea    Emphysema lung (HCC)    GERD (gastroesophageal reflux disease)    Hyperlipidemia    Hypertension    Hypothyroid    Peripheral vascular disease ([place])    Pre-diabetes    Restless leg syndrome    Seizures ([place])    02/13/2011 -hx of seizure due to "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  no seizure since--pt does take keppra and office note from neurologist dr. Leta Baptist on this chart   Urinary incontinence     Review of systems:  Otherwise negative.    Physical Exam  Gen: Alert, oriented. Appears stated age.  HEENT: PERRLA. Lungs: No respiratory distress CV: RRR Abd: soft, benign, no masses Ext: No edema    Planned procedures: Proceed with colonoscopy. The patient understands the nature of the planned procedure, indications, risks, alternatives and potential complications including but not limited to bleeding, infection, perforation, damage to internal organs and possible oversedation/side effects from anesthesia. The patient agrees and gives consent to proceed.  Please refer to procedure notes for findings, recommendations and patient disposition/instructions.     Rachel Rubenstein, MD [facility]

## 2022-06-17 NOTE — Anesthesia Procedure Notes (Signed)
Procedure Name: MAC Date/Time: 06/17/2022 1:42 PM  Performed by: Biagio Borg, CRNAPre-anesthesia Checklist: Patient identified, Emergency Drugs available, Suction available, Patient being monitored and Timeout performed Patient Re-evaluated:Patient Re-evaluated prior to induction Oxygen Delivery Method: Nasal cannula Induction Type: IV induction Placement Confirmation: positive ETCO2 and CO2 detector

## 2022-06-17 NOTE — Transfer of Care (Signed)
Immediate Anesthesia Transfer of Care Note  Patient: [name]  Procedure(s) Performed: COLONOSCOPY WITH PROPOFOL  Patient Location: PACU and Endoscopy Unit  Anesthesia Type:General  Level of Consciousness: awake  Airway & Oxygen Therapy: Patient Spontanous Breathing and Patient connected to nasal cannula oxygen  Post-op Assessment: Report given to RN and Post -op Vital signs reviewed and stable  Post vital signs: Reviewed and stable  Last Vitals:  Vitals Value Taken Time  BP 110/65 06/17/22 1412  Temp    Pulse 70 06/17/22 1414  Resp 16 06/17/22 1414  SpO2 99 % 06/17/22 1414  Vitals shown include unvalidated device data.  Last Pain:  Vitals:   06/17/22 1305  TempSrc: Temporal  PainSc: 6          Complications: No notable events documented.

## 2022-06-17 NOTE — Interval H&P Note (Signed)
History and Physical Interval Note:  06/17/2022 1:20 PM  [name]  has presented today for surgery, with the diagnosis of Colon Cancer Screening.  The various methods of treatment have been discussed with the patient and family. After consideration of risks, benefits and other options for treatment, the patient has consented to  Procedure(s): COLONOSCOPY WITH PROPOFOL (N/A) as a surgical intervention.  The patient's history has been reviewed, patient examined, no change in status, stable for surgery.  I have reviewed the patient's chart and labs.  Questions were answered to the patient's satisfaction.     Lesly Rubenstein  Ok to proceed with colonoscopy

## 2022-06-17 NOTE — Anesthesia Preprocedure Evaluation (Signed)
Anesthesia Evaluation  Patient identified by MRN, date of birth, ID band Patient awake    Reviewed: Allergy & Precautions, H&P , NPO status , Patient's Chart, lab work & pertinent test results, reviewed documented beta blocker date and time   History of Anesthesia Complications Negative for: history of anesthetic complications  Airway Mallampati: II  TM Distance: >3 FB Neck ROM: full    Dental  (+) Dental Advidsory Given, Edentulous Lower, Upper Dentures   Pulmonary shortness of breath, neg sleep apnea, COPD, neg recent URI, Current Smoker,    Pulmonary exam normal breath sounds clear to auscultation       Cardiovascular Exercise Tolerance: Good hypertension, (-) angina+ Peripheral Vascular Disease and +CHF  (-) Past MI and (-) Cardiac Stents Normal cardiovascular exam(-) dysrhythmias (-) Valvular Problems/Murmurs Rhythm:regular Rate:Normal     Neuro/Psych Seizures -, Well Controlled,  PSYCHIATRIC DISORDERS Anxiety Depression  Neuromuscular disease    GI/Hepatic Neg liver ROS, GERD  ,  Endo/Other  diabetesHypothyroidism   Renal/GU CRFRenal disease  negative genitourinary   Musculoskeletal   Abdominal   Peds  Hematology negative hematology ROS (+)   Anesthesia Other Findings Past Medical History: No date: (HFpEF) heart failure with preserved ejection fraction ([place]) 11/2020: Acute respiratory distress No date: Anxiety No date: Arthritis No date: CHF (congestive heart failure) (HCC) No date: Chronic respiratory failure with hypoxia and hypercapnia  (HCC) No date: COPD (chronic obstructive pulmonary disease) (HCC)     Comment:  no inhalers--smoker, no oxygen No date: Depression No date: Dyspnea No date: Emphysema lung (HCC) No date: GERD (gastroesophageal reflux disease) No date: Hyperlipidemia No date: Hypertension No date: Hypothyroid No date: Peripheral vascular disease ([place]) No date: Pre-diabetes No date:  Restless leg syndrome No date: Seizures ([place])     Comment:  02/13/2011 -hx of seizure due to "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  no seizure since--pt does take               keppra and office note from neurologist dr. Leta Baptist on               this chart No date: Urinary incontinence   Reproductive/Obstetrics negative OB ROS                             Anesthesia Physical Anesthesia Plan  ASA: 3  Anesthesia Plan: General   Post-op Pain Management:    Induction: Intravenous  PONV Risk Score and Plan: 2 and Propofol infusion and TIVA  Airway Management Planned: Natural Airway and Nasal Cannula  Additional Equipment:   Intra-op Plan:   Post-operative Plan:   Informed Consent: I have reviewed the patients History and Physical, chart, labs and discussed the procedure including the risks, benefits and alternatives for the proposed anesthesia with the patient or authorized representative who has indicated his/her understanding and acceptance.     Dental Advisory Given  Plan Discussed with: Anesthesiologist, CRNA and Surgeon  Anesthesia Plan Comments:         Anesthesia Quick Evaluation

## 2022-06-17 NOTE — Op Note (Signed)
[facility] Patient Name: [name] Procedure Date: 06/17/2022 12:20 PM MRN: [id number] Account #: [id number] Date of Birth: [name] 29, 1956 Admit Type: Outpatient Age: 66 Room: [facility] ENDO ROOM 1 Gender: Female Note Status: Finalized Instrument Name: Park Meo [id number] Procedure:             Colonoscopy Indications:           Screening for colorectal malignant neoplasm Providers:             Andrey Farmer MD, MD Referring MD:          Venetia Maxon. Elijio Miles, MD (Referring MD) Medicines:             Monitored Anesthesia Care Complications:         No immediate complications. Estimated blood loss:                         Minimal. Procedure:             Pre-Anesthesia Assessment:                        - Prior to the procedure, a History and Physical was                         performed, and patient medications and allergies were                         reviewed. The patient is competent. The risks and                         benefits of the procedure and the sedation options and                         risks were discussed with the patient. All questions                         were answered and informed consent was obtained.                         Patient identification and proposed procedure were                         verified by the physician, the nurse, the                         anesthesiologist, the anesthetist and the technician                         in the endoscopy suite. Mental Status Examination:                         alert and oriented. Airway Examination: normal                         oropharyngeal airway and neck mobility. Respiratory                         Examination: clear to auscultation. CV Examination:  normal. Prophylactic Antibiotics: The patient does not                         require prophylactic antibiotics. Prior                         Anticoagulants: The patient has taken no previous                          anticoagulant or antiplatelet agents. ASA Grade                         Assessment: III - A patient with severe systemic                         disease. After reviewing the risks and benefits, the                         patient was deemed in satisfactory condition to                         undergo the procedure. The anesthesia plan was to use                         monitored anesthesia care (MAC). Immediately prior to                         administration of medications, the patient was                         re-assessed for adequacy to receive sedatives. The                         heart rate, respiratory rate, oxygen saturations,                         blood pressure, adequacy of pulmonary ventilation, and                         response to care were monitored throughout the                         procedure. The physical status of the patient was                         re-assessed after the procedure.                        After obtaining informed consent, the colonoscope was                         passed under direct vision. Throughout the procedure,                         the patient's blood pressure, pulse, and oxygen                         saturations were monitored continuously. The  Colonoscope was introduced through the anus and                         advanced to the the cecum, identified by appendiceal                         orifice and ileocecal valve. The colonoscopy was                         performed without difficulty. The patient tolerated                         the procedure well. The quality of the bowel                         preparation was adequate to identify polyps. Findings:      The digital rectal exam findings include decreased sphincter tone.      Four sessile polyps were found in the descending colon. The polyps were       2 to 4 mm in size. These polyps were removed with a cold snare.       Resection  and retrieval were complete. Estimated blood loss was minimal.      A 2 mm polyp was found in the sigmoid colon. The polyp was sessile. The       polyp was removed with a cold snare. Resection and retrieval were       complete. Estimated blood loss was minimal.      A diffuse area of moderately erythematous mucosa was found in the       rectum. This is likely secondary to patient's known rectal prolapse.       Biopsies were taken with a cold forceps for histology. Estimated blood       loss was minimal.      Retroflexion in the rectum was not performed due to as patient not able       to adequately hold air. Impression:            - Decreased sphincter tone found on digital rectal                         exam.                        - Four 2 to 4 mm polyps in the descending colon,                         removed with a cold snare. Resected and retrieved.                        - One 2 mm polyp in the sigmoid colon, removed with a                         cold snare. Resected and retrieved.                        - Erythematous mucosa in the rectum. Biopsied. Recommendation:        - Discharge patient to home.                        -  Clear liquid diet.                        - Continue present medications.                        - Await pathology results.                        - Repeat colonoscopy in 3 years for surveillance.                        - Return to referring physician as previously                         scheduled. Procedure Code(s):     --- Professional ---                        [phone], Colonoscopy, flexible; with removal of                         tumor(s), polyp(s), or other lesion(s) by snare                         technique                        [id number], 81, Colonoscopy, flexible; with biopsy, single                         or multiple Diagnosis Code(s):     --- Professional ---                        K62.89, Other specified diseases of anus and rectum                         K63.5, Polyp of colon                        Z12.11, Encounter for screening for malignant neoplasm                         of colon CPT copyright 2019 [employer]. All rights reserved. The codes documented in this report are preliminary and upon coder review may  be revised to meet current compliance requirements. Andrey Farmer MD, MD 06/17/2022 2:25:53 PM Number of Addenda: 0 Note Initiated On: 06/17/2022 12:20 PM Scope Withdrawal Time: 0 hours 18 minutes 3 seconds  Total Procedure Duration: 0 hours 26 minutes 31 seconds  Estimated Blood Loss:  Estimated blood loss was minimal.      [facility]

## 2022-06-18 ENCOUNTER — Other Ambulatory Visit: Payer: Self-pay

## 2022-06-18 ENCOUNTER — Inpatient Hospital Stay (HOSPITAL_COMMUNITY): Payer: 59 | Admitting: Physician Assistant

## 2022-06-18 ENCOUNTER — Inpatient Hospital Stay (HOSPITAL_COMMUNITY)
Admission: RE | Admit: 2022-06-18 | Discharge: 2022-06-20 | DRG: 330 | Disposition: A | Discharge status: Home / Self Care | Payer: 59 | Source: Ambulatory Visit | Attending: Surgery | Admitting: Surgery

## 2022-06-18 ENCOUNTER — Encounter (HOSPITAL_COMMUNITY): Payer: Self-pay | Admitting: Surgery

## 2022-06-18 ENCOUNTER — Other Ambulatory Visit (HOSPITAL_COMMUNITY): Payer: Self-pay

## 2022-06-18 ENCOUNTER — Encounter (HOSPITAL_COMMUNITY)
Admission: RE | Disposition: A | Discharge status: Home / Self Care | Payer: Self-pay | Source: Ambulatory Visit | Attending: Surgery

## 2022-06-18 DIAGNOSIS — F32A Depression, unspecified: Secondary | ICD-10-CM | POA: Diagnosis present

## 2022-06-18 DIAGNOSIS — Z720 Tobacco use: Secondary | ICD-10-CM | POA: Diagnosis not present

## 2022-06-18 DIAGNOSIS — I5033 Acute on chronic diastolic (congestive) heart failure: Secondary | ICD-10-CM | POA: Diagnosis not present

## 2022-06-18 DIAGNOSIS — D259 Leiomyoma of uterus, unspecified: Secondary | ICD-10-CM | POA: Diagnosis present

## 2022-06-18 DIAGNOSIS — Z885 Allergy status to narcotic agent status: Secondary | ICD-10-CM | POA: Diagnosis not present

## 2022-06-18 DIAGNOSIS — K623 Rectal prolapse: Secondary | ICD-10-CM | POA: Diagnosis present

## 2022-06-18 DIAGNOSIS — Z7982 Long term (current) use of aspirin: Secondary | ICD-10-CM

## 2022-06-18 DIAGNOSIS — D649 Anemia, unspecified: Secondary | ICD-10-CM

## 2022-06-18 DIAGNOSIS — Z01818 Encounter for other preprocedural examination: Principal | ICD-10-CM

## 2022-06-18 DIAGNOSIS — I110 Hypertensive heart disease with heart failure: Secondary | ICD-10-CM

## 2022-06-18 DIAGNOSIS — Z8249 Family history of ischemic heart disease and other diseases of the circulatory system: Secondary | ICD-10-CM

## 2022-06-18 DIAGNOSIS — N179 Acute kidney failure, unspecified: Secondary | ICD-10-CM | POA: Diagnosis present

## 2022-06-18 DIAGNOSIS — Z96653 Presence of artificial knee joint, bilateral: Secondary | ICD-10-CM | POA: Diagnosis present

## 2022-06-18 DIAGNOSIS — F17210 Nicotine dependence, cigarettes, uncomplicated: Secondary | ICD-10-CM | POA: Diagnosis not present

## 2022-06-18 DIAGNOSIS — I5032 Chronic diastolic (congestive) heart failure: Secondary | ICD-10-CM | POA: Diagnosis present

## 2022-06-18 DIAGNOSIS — E785 Hyperlipidemia, unspecified: Secondary | ICD-10-CM | POA: Diagnosis present

## 2022-06-18 DIAGNOSIS — I10 Essential (primary) hypertension: Secondary | ICD-10-CM | POA: Diagnosis not present

## 2022-06-18 DIAGNOSIS — Z886 Allergy status to analgesic agent status: Secondary | ICD-10-CM | POA: Diagnosis not present

## 2022-06-18 DIAGNOSIS — Z833 Family history of diabetes mellitus: Secondary | ICD-10-CM

## 2022-06-18 DIAGNOSIS — E669 Obesity, unspecified: Secondary | ICD-10-CM | POA: Diagnosis present

## 2022-06-18 DIAGNOSIS — K30 Functional dyspepsia: Secondary | ICD-10-CM | POA: Diagnosis present

## 2022-06-18 DIAGNOSIS — K219 Gastro-esophageal reflux disease without esophagitis: Secondary | ICD-10-CM | POA: Diagnosis present

## 2022-06-18 DIAGNOSIS — J439 Emphysema, unspecified: Secondary | ICD-10-CM | POA: Diagnosis present

## 2022-06-18 DIAGNOSIS — M7918 Myalgia, other site: Secondary | ICD-10-CM | POA: Diagnosis present

## 2022-06-18 DIAGNOSIS — F419 Anxiety disorder, unspecified: Secondary | ICD-10-CM | POA: Diagnosis present

## 2022-06-18 DIAGNOSIS — J449 Chronic obstructive pulmonary disease, unspecified: Secondary | ICD-10-CM | POA: Diagnosis present

## 2022-06-18 DIAGNOSIS — Z7952 Long term (current) use of systemic steroids: Secondary | ICD-10-CM

## 2022-06-18 DIAGNOSIS — G2581 Restless legs syndrome: Secondary | ICD-10-CM | POA: Diagnosis present

## 2022-06-18 DIAGNOSIS — R739 Hyperglycemia, unspecified: Secondary | ICD-10-CM | POA: Diagnosis present

## 2022-06-18 DIAGNOSIS — N813 Complete uterovaginal prolapse: Secondary | ICD-10-CM | POA: Diagnosis present

## 2022-06-18 DIAGNOSIS — Z7951 Long term (current) use of inhaled steroids: Secondary | ICD-10-CM

## 2022-06-18 DIAGNOSIS — M5136 Other intervertebral disc degeneration, lumbar region: Secondary | ICD-10-CM | POA: Diagnosis present

## 2022-06-18 DIAGNOSIS — J9611 Chronic respiratory failure with hypoxia: Secondary | ICD-10-CM

## 2022-06-18 DIAGNOSIS — G40909 Epilepsy, unspecified, not intractable, without status epilepticus: Secondary | ICD-10-CM | POA: Diagnosis present

## 2022-06-18 DIAGNOSIS — Z79890 Hormone replacement therapy (postmenopausal): Secondary | ICD-10-CM

## 2022-06-18 DIAGNOSIS — N183 Chronic kidney disease, stage 3 unspecified: Secondary | ICD-10-CM | POA: Diagnosis present

## 2022-06-18 DIAGNOSIS — Z6831 Body mass index (BMI) 31.0-31.9, adult: Secondary | ICD-10-CM

## 2022-06-18 DIAGNOSIS — G894 Chronic pain syndrome: Secondary | ICD-10-CM | POA: Diagnosis present

## 2022-06-18 DIAGNOSIS — Z820 Family history of epilepsy and other diseases of the nervous system: Secondary | ICD-10-CM

## 2022-06-18 DIAGNOSIS — Z9981 Dependence on supplemental oxygen: Secondary | ICD-10-CM | POA: Diagnosis not present

## 2022-06-18 DIAGNOSIS — Z888 Allergy status to other drugs, medicaments and biological substances status: Secondary | ICD-10-CM | POA: Diagnosis not present

## 2022-06-18 DIAGNOSIS — I739 Peripheral vascular disease, unspecified: Secondary | ICD-10-CM | POA: Diagnosis present

## 2022-06-18 DIAGNOSIS — Z79899 Other long term (current) drug therapy: Secondary | ICD-10-CM

## 2022-06-18 DIAGNOSIS — M51369 Other intervertebral disc degeneration, lumbar region without mention of lumbar back pain or lower extremity pain: Secondary | ICD-10-CM | POA: Diagnosis present

## 2022-06-18 DIAGNOSIS — E039 Hypothyroidism, unspecified: Secondary | ICD-10-CM | POA: Diagnosis present

## 2022-06-18 DIAGNOSIS — J441 Chronic obstructive pulmonary disease with (acute) exacerbation: Secondary | ICD-10-CM | POA: Diagnosis not present

## 2022-06-18 DIAGNOSIS — R7303 Prediabetes: Secondary | ICD-10-CM | POA: Diagnosis present

## 2022-06-18 HISTORY — PX: PROCTOSCOPY: SHX2266

## 2022-06-18 LAB — CBC WITH DIFFERENTIAL/PLATELET
Abs Immature Granulocytes: 0.1 10*3/uL — ABNORMAL HIGH (ref 0.00–0.07)
Basophils Absolute: 0 10*3/uL (ref 0.0–0.1)
Basophils Relative: 0 %
Eosinophils Absolute: 0 10*3/uL (ref 0.0–0.5)
Eosinophils Relative: 0 %
HCT: 42 % (ref 36.0–46.0)
Hemoglobin: 13.5 g/dL (ref 12.0–15.0)
Immature Granulocytes: 1 %
Lymphocytes Relative: 2 %
Lymphs Abs: 0.5 10*3/uL — ABNORMAL LOW (ref 0.7–4.0)
MCH: 32 pg (ref 26.0–34.0)
MCHC: 32.1 g/dL (ref 30.0–36.0)
MCV: 99.5 fL (ref 80.0–100.0)
Monocytes Absolute: 0.9 10*3/uL (ref 0.1–1.0)
Monocytes Relative: 4 %
Neutro Abs: 19.3 10*3/uL — ABNORMAL HIGH (ref 1.7–7.7)
Neutrophils Relative %: 93 %
Platelets: 226 10*3/uL (ref 150–400)
RBC: 4.22 MIL/uL (ref 3.87–5.11)
RDW: 14 % (ref 11.5–15.5)
WBC: 20.7 10*3/uL — ABNORMAL HIGH (ref 4.0–10.5)
nRBC: 0 % (ref 0.0–0.2)

## 2022-06-18 LAB — COMPREHENSIVE METABOLIC PANEL
ALT: 19 U/L (ref 0–44)
AST: 21 U/L (ref 15–41)
Albumin: 3.3 g/dL — ABNORMAL LOW (ref 3.5–5.0)
Alkaline Phosphatase: 17 U/L — ABNORMAL LOW (ref 38–126)
Anion gap: 8 (ref 5–15)
BUN: 16 mg/dL (ref 8–23)
CO2: 27 mmol/L (ref 22–32)
Calcium: 9 mg/dL (ref 8.9–10.3)
Chloride: 102 mmol/L (ref 98–111)
Creatinine, Ser: 1.21 mg/dL — ABNORMAL HIGH (ref 0.44–1.00)
GFR, Estimated: 50 mL/min — ABNORMAL LOW (ref >60)
Glucose, Bld: 183 mg/dL — ABNORMAL HIGH (ref 70–99)
Potassium: 3.7 mmol/L (ref 3.5–5.1)
Sodium: 137 mmol/L (ref 135–145)
Total Bilirubin: 0.6 mg/dL (ref 0.3–1.2)
Total Protein: 6.4 g/dL — ABNORMAL LOW (ref 6.5–8.1)

## 2022-06-18 LAB — BPAM RBC
Blood Product Expiration Date: 202307272359
Blood Product Expiration Date: 202307272359
Unit Type and Rh: 6200
Unit Type and Rh: 6200

## 2022-06-18 LAB — TYPE AND SCREEN
ABO/RH(D): A POS
Antibody Screen: POSITIVE
PT AG Type: NEGATIVE
Unit division: 0
Unit division: 0

## 2022-06-18 LAB — MAGNESIUM: Magnesium: 2 mg/dL (ref 1.7–2.4)

## 2022-06-18 LAB — GLUCOSE, CAPILLARY: Glucose-Capillary: 129 mg/dL — ABNORMAL HIGH (ref 70–99)

## 2022-06-18 LAB — HEMOGLOBIN A1C
Hgb A1c MFr Bld: 5.2 % (ref 4.8–5.6)
Mean Plasma Glucose: 102.54 mg/dL

## 2022-06-18 SURGERY — COLECTOMY, SIGMOID, ROBOT-ASSISTED
Anesthesia: General

## 2022-06-18 MED ORDER — ENALAPRILAT 1.25 MG/ML IV SOLN: 0.6250 mg | Freq: Four times a day (QID) | INTRAVENOUS | Status: DC | PRN

## 2022-06-18 MED ORDER — KETAMINE HCL 10 MG/ML IJ SOLN
INTRAMUSCULAR | Status: DC | PRN
  Administered 2022-06-18: 30 mg via INTRAVENOUS
  Administered 2022-06-18: 20 mg via INTRAVENOUS

## 2022-06-18 MED ORDER — ONDANSETRON HCL 4 MG/2ML IJ SOLN
INTRAMUSCULAR | Status: DC | PRN
  Administered 2022-06-18: 4 mg via INTRAVENOUS

## 2022-06-18 MED ORDER — ASPIRIN 81 MG PO TBEC
81.0000 mg | DELAYED_RELEASE_TABLET | Freq: Every day | ORAL | Status: DC
  Administered 2022-06-19 – 2022-06-20 (×2): 81 mg via ORAL
  Filled 2022-06-18 (×2): qty 1

## 2022-06-18 MED ORDER — TRAMADOL HCL 50 MG PO TABS
50.0000 mg | ORAL_TABLET | Freq: Four times a day (QID) | ORAL | Status: DC | PRN
  Administered 2022-06-18 – 2022-06-20 (×6): 100 mg via ORAL
  Filled 2022-06-18 (×6): qty 2

## 2022-06-18 MED ORDER — PROCHLORPERAZINE MALEATE 10 MG PO TABS: 10.0000 mg | ORAL_TABLET | Freq: Four times a day (QID) | ORAL | Status: DC | PRN

## 2022-06-18 MED ORDER — SODIUM CHLORIDE 0.9 % IV SOLN: 250.0000 mL | INTRAVENOUS | Status: DC | PRN

## 2022-06-18 MED ORDER — ENOXAPARIN SODIUM 40 MG/0.4ML IJ SOSY
40.0000 mg | PREFILLED_SYRINGE | INTRAMUSCULAR | Status: DC
  Administered 2022-06-19 – 2022-06-20 (×2): 40 mg via SUBCUTANEOUS
  Filled 2022-06-18 (×2): qty 0.4

## 2022-06-18 MED ORDER — VIBEGRON 75 MG PO TABS
75.0000 mg | ORAL_TABLET | Freq: Every day | ORAL | Status: DC
Start: 2022-06-18 — End: 2022-06-18

## 2022-06-18 MED ORDER — AMISULPRIDE (ANTIEMETIC) 5 MG/2ML IV SOLN: 10.0000 mg | Freq: Once | INTRAVENOUS | Status: DC | PRN

## 2022-06-18 MED ORDER — METHOCARBAMOL 500 MG PO TABS
1000.0000 mg | ORAL_TABLET | Freq: Four times a day (QID) | ORAL | Status: DC | PRN
  Administered 2022-06-18: 1000 mg via ORAL
  Filled 2022-06-18: qty 2

## 2022-06-18 MED ORDER — LIP MEDEX EX OINT
TOPICAL_OINTMENT | Freq: Two times a day (BID) | CUTANEOUS | Status: DC
  Administered 2022-06-18: 75 via TOPICAL
  Filled 2022-06-18: qty 7

## 2022-06-18 MED ORDER — HYDROMORPHONE HCL 1 MG/ML IJ SOLN
0.2500 mg | INTRAMUSCULAR | Status: DC | PRN
  Administered 2022-06-18: 0.5 mg via INTRAVENOUS

## 2022-06-18 MED ORDER — HYDROMORPHONE HCL 2 MG/ML IJ SOLN
INTRAMUSCULAR | Status: AC
  Filled 2022-06-18: qty 1

## 2022-06-18 MED ORDER — PROPOFOL 10 MG/ML IV BOLUS
INTRAVENOUS | Status: AC
  Filled 2022-06-18: qty 20

## 2022-06-18 MED ORDER — HYDROMORPHONE HCL 1 MG/ML IJ SOLN
0.5000 mg | INTRAMUSCULAR | Status: DC | PRN
  Administered 2022-06-18: 2 mg via INTRAVENOUS
  Administered 2022-06-18 – 2022-06-20 (×5): 1 mg via INTRAVENOUS
  Filled 2022-06-18 (×4): qty 1
  Filled 2022-06-18: qty 2
  Filled 2022-06-18: qty 1

## 2022-06-18 MED ORDER — ESCITALOPRAM OXALATE 20 MG PO TABS
30.0000 mg | ORAL_TABLET | Freq: Every day | ORAL | Status: DC
  Administered 2022-06-19 – 2022-06-20 (×2): 30 mg via ORAL
  Filled 2022-06-18 (×2): qty 2

## 2022-06-18 MED ORDER — ACETAMINOPHEN 500 MG PO TABS: 1000.0000 mg | ORAL_TABLET | ORAL | Status: AC

## 2022-06-18 MED ORDER — FENOFIBRATE 54 MG PO TABS
54.0000 mg | ORAL_TABLET | Freq: Every day | ORAL | Status: DC
  Administered 2022-06-18 – 2022-06-20 (×3): 54 mg via ORAL
  Filled 2022-06-18 (×3): qty 1

## 2022-06-18 MED ORDER — LEVOTHYROXINE SODIUM 25 MCG PO TABS
25.0000 ug | ORAL_TABLET | Freq: Every day | ORAL | Status: DC
Start: 2022-06-19 — End: 2022-06-20
  Administered 2022-06-19 – 2022-06-20 (×2): 25 ug via ORAL
  Filled 2022-06-18 (×2): qty 1

## 2022-06-18 MED ORDER — BUPIVACAINE-EPINEPHRINE (PF) 0.25% -1:200000 IJ SOLN
INTRAMUSCULAR | Status: AC
  Filled 2022-06-18: qty 60

## 2022-06-18 MED ORDER — SUGAMMADEX SODIUM 200 MG/2ML IV SOLN
INTRAVENOUS | Status: DC | PRN
  Administered 2022-06-18: 200 mg via INTRAVENOUS

## 2022-06-18 MED ORDER — BUPIVACAINE-EPINEPHRINE (PF) 0.25% -1:200000 IJ SOLN
INTRAMUSCULAR | Status: DC | PRN
  Administered 2022-06-18: 60 mL via PERINEURAL

## 2022-06-18 MED ORDER — PROCHLORPERAZINE EDISYLATE 10 MG/2ML IJ SOLN: 5.0000 mg | Freq: Four times a day (QID) | INTRAMUSCULAR | Status: DC | PRN

## 2022-06-18 MED ORDER — FLUTICASONE PROPIONATE 50 MCG/ACT NA SUSP
1.0000 | Freq: Every day | NASAL | Status: DC | PRN
Start: 2022-06-18 — End: 2022-06-20

## 2022-06-18 MED ORDER — NYSTATIN 100000 UNIT/GM EX POWD
Freq: Two times a day (BID) | CUTANEOUS | Status: DC
  Filled 2022-06-18: qty 15

## 2022-06-18 MED ORDER — SODIUM CHLORIDE 0.9 % IV SOLN
2.0000 g | INTRAVENOUS | Status: AC
  Administered 2022-06-18: 2 g via INTRAVENOUS
  Filled 2022-06-18: qty 2

## 2022-06-18 MED ORDER — FENTANYL CITRATE (PF) 100 MCG/2ML IJ SOLN
INTRAMUSCULAR | Status: AC
  Filled 2022-06-18: qty 2

## 2022-06-18 MED ORDER — FENTANYL CITRATE (PF) 100 MCG/2ML IJ SOLN
INTRAMUSCULAR | Status: DC | PRN
  Administered 2022-06-18: 150 ug via INTRAVENOUS
  Administered 2022-06-18: 50 ug via INTRAVENOUS

## 2022-06-18 MED ORDER — NICOTINE 21 MG/24HR TD PT24
21.0000 mg | MEDICATED_PATCH | Freq: Every day | TRANSDERMAL | Status: DC | PRN
Start: 2022-06-18 — End: 2022-06-20
  Administered 2022-06-18 – 2022-06-19 (×2): 21 mg via TRANSDERMAL
  Filled 2022-06-18 (×2): qty 1

## 2022-06-18 MED ORDER — LACTATED RINGERS IV SOLN: INTRAVENOUS | Status: DC

## 2022-06-18 MED ORDER — ROCURONIUM BROMIDE 10 MG/ML (PF) SYRINGE
PREFILLED_SYRINGE | INTRAVENOUS | Status: AC
  Filled 2022-06-18: qty 10

## 2022-06-18 MED ORDER — SODIUM CHLORIDE 0.9% FLUSH: 3.0000 mL | INTRAVENOUS | Status: DC | PRN

## 2022-06-18 MED ORDER — LORATADINE 10 MG PO TABS
10.0000 mg | ORAL_TABLET | Freq: Every day | ORAL | Status: DC
  Administered 2022-06-19 – 2022-06-20 (×2): 10 mg via ORAL
  Filled 2022-06-18 (×2): qty 1

## 2022-06-18 MED ORDER — DEXAMETHASONE SODIUM PHOSPHATE 10 MG/ML IJ SOLN
INTRAMUSCULAR | Status: DC | PRN
  Administered 2022-06-18: 4 mg via INTRAVENOUS

## 2022-06-18 MED ORDER — MELATONIN 3 MG PO TABS
3.0000 mg | ORAL_TABLET | Freq: Every evening | ORAL | Status: DC | PRN
  Administered 2022-06-18 – 2022-06-19 (×2): 3 mg via ORAL
  Filled 2022-06-18 (×2): qty 1

## 2022-06-18 MED ORDER — BUPIVACAINE LIPOSOME 1.3 % IJ SUSP
INTRAMUSCULAR | Status: DC | PRN
  Administered 2022-06-18: 20 mL

## 2022-06-18 MED ORDER — SIMETHICONE 80 MG PO CHEW: 40.0000 mg | CHEWABLE_TABLET | Freq: Four times a day (QID) | ORAL | Status: DC | PRN

## 2022-06-18 MED ORDER — PHENYLEPHRINE HCL-NACL 20-0.9 MG/250ML-% IV SOLN
INTRAVENOUS | Status: DC | PRN
  Administered 2022-06-18: 30 ug/min via INTRAVENOUS

## 2022-06-18 MED ORDER — LACTATED RINGERS IR SOLN
Status: DC | PRN
  Administered 2022-06-18: 1000 mL

## 2022-06-18 MED ORDER — UMECLIDINIUM BROMIDE 62.5 MCG/ACT IN AEPB
1.0000 | INHALATION_SPRAY | Freq: Every day | RESPIRATORY_TRACT | Status: DC
  Administered 2022-06-19 – 2022-06-20 (×2): 1 via RESPIRATORY_TRACT
  Filled 2022-06-18: qty 7

## 2022-06-18 MED ORDER — LEVETIRACETAM 500 MG PO TABS
500.0000 mg | ORAL_TABLET | Freq: Every day | ORAL | Status: DC
  Administered 2022-06-19 – 2022-06-20 (×2): 500 mg via ORAL
  Filled 2022-06-18 (×2): qty 1

## 2022-06-18 MED ORDER — DIPHENHYDRAMINE HCL 50 MG/ML IJ SOLN: 12.5000 mg | Freq: Four times a day (QID) | INTRAMUSCULAR | Status: DC | PRN

## 2022-06-18 MED ORDER — GABAPENTIN 300 MG PO CAPS: 300.0000 mg | ORAL_CAPSULE | ORAL | Status: DC

## 2022-06-18 MED ORDER — LACTATED RINGERS IV SOLN: INTRAVENOUS | Status: AC

## 2022-06-18 MED ORDER — ACETAMINOPHEN 500 MG PO TABS
1000.0000 mg | ORAL_TABLET | Freq: Four times a day (QID) | ORAL | Status: DC
  Administered 2022-06-18 – 2022-06-20 (×6): 1000 mg via ORAL
  Filled 2022-06-18 (×6): qty 2

## 2022-06-18 MED ORDER — TRAMADOL HCL 50 MG PO TABS
50.0000 mg | ORAL_TABLET | Freq: Four times a day (QID) | ORAL | 0 refills | Status: DC | PRN
  Filled 2022-06-18: qty 20, 3d supply, fill #0

## 2022-06-18 MED ORDER — SUCCINYLCHOLINE CHLORIDE 200 MG/10ML IV SOSY
PREFILLED_SYRINGE | INTRAVENOUS | Status: AC
  Filled 2022-06-18: qty 10

## 2022-06-18 MED ORDER — SODIUM CHLORIDE 0.9 % IR SOLN
Status: DC | PRN
  Administered 2022-06-18: 1000 mL

## 2022-06-18 MED ORDER — HYDROMORPHONE HCL 1 MG/ML IJ SOLN
INTRAMUSCULAR | Status: AC
  Filled 2022-06-18: qty 1

## 2022-06-18 MED ORDER — LACTATED RINGERS IV SOLN: INTRAVENOUS | Status: DC | PRN

## 2022-06-18 MED ORDER — RISAQUAD PO CAPS
1.0000 | ORAL_CAPSULE | Freq: Every day | ORAL | Status: DC
  Administered 2022-06-19 – 2022-06-20 (×2): 1 via ORAL
  Filled 2022-06-18 (×2): qty 1

## 2022-06-18 MED ORDER — ALVIMOPAN 12 MG PO CAPS
12.0000 mg | ORAL_CAPSULE | ORAL | Status: AC
  Administered 2022-06-18: 12 mg via ORAL
  Filled 2022-06-18: qty 1

## 2022-06-18 MED ORDER — CALCIUM POLYCARBOPHIL 625 MG PO TABS
625.0000 mg | ORAL_TABLET | Freq: Two times a day (BID) | ORAL | Status: DC
  Administered 2022-06-19 – 2022-06-20 (×3): 625 mg via ORAL
  Filled 2022-06-18 (×3): qty 1

## 2022-06-18 MED ORDER — PHENYLEPHRINE HCL (PRESSORS) 10 MG/ML IV SOLN
INTRAVENOUS | Status: AC
  Filled 2022-06-18: qty 1

## 2022-06-18 MED ORDER — PROPOFOL 10 MG/ML IV BOLUS
INTRAVENOUS | Status: DC | PRN
  Administered 2022-06-18: 50 mg via INTRAVENOUS
  Administered 2022-06-18: 120 mg via INTRAVENOUS

## 2022-06-18 MED ORDER — BUPIVACAINE LIPOSOME 1.3 % IJ SUSP: 20.0000 mL | Freq: Once | INTRAMUSCULAR | Status: DC

## 2022-06-18 MED ORDER — LIDOCAINE 2% (20 MG/ML) 5 ML SYRINGE
INTRAMUSCULAR | Status: DC | PRN
  Administered 2022-06-18: 1.5 mg/kg/h via INTRAVENOUS
  Administered 2022-06-18: 60 mg via INTRAVENOUS

## 2022-06-18 MED ORDER — LIDOCAINE HCL (PF) 2 % IJ SOLN
INTRAMUSCULAR | Status: AC
Start: 2022-06-18 — End: ?
  Filled 2022-06-18: qty 30

## 2022-06-18 MED ORDER — ACETAMINOPHEN 500 MG PO TABS
ORAL_TABLET | ORAL | Status: AC
  Administered 2022-06-18: 1000 mg via ORAL
  Filled 2022-06-18: qty 2

## 2022-06-18 MED ORDER — LACTATED RINGERS IV BOLUS
1000.0000 mL | Freq: Once | INTRAVENOUS | Status: AC
  Administered 2022-06-18: 1000 mL via INTRAVENOUS

## 2022-06-18 MED ORDER — BUSPIRONE HCL 5 MG PO TABS
30.0000 mg | ORAL_TABLET | Freq: Two times a day (BID) | ORAL | Status: DC
  Administered 2022-06-18 – 2022-06-20 (×3): 30 mg via ORAL
  Filled 2022-06-18 (×3): qty 6

## 2022-06-18 MED ORDER — GLYCOPYRROLATE 0.2 MG/ML IJ SOLN
INTRAMUSCULAR | Status: DC | PRN
  Administered 2022-06-18: .2 mg via INTRAVENOUS

## 2022-06-18 MED ORDER — MIRABEGRON ER 25 MG PO TB24
25.0000 mg | ORAL_TABLET | Freq: Every day | ORAL | Status: DC
  Administered 2022-06-19 – 2022-06-20 (×2): 25 mg via ORAL
  Filled 2022-06-18 (×2): qty 1

## 2022-06-18 MED ORDER — SODIUM CHLORIDE 0.9% FLUSH
3.0000 mL | Freq: Two times a day (BID) | INTRAVENOUS | Status: DC
  Administered 2022-06-19 – 2022-06-20 (×2): 3 mL via INTRAVENOUS

## 2022-06-18 MED ORDER — INSULIN ASPART 100 UNIT/ML IJ SOLN: 0.0000 [IU] | Freq: Three times a day (TID) | INTRAMUSCULAR | Status: DC

## 2022-06-18 MED ORDER — METHOCARBAMOL 1000 MG/10ML IJ SOLN: 1000.0000 mg | Freq: Four times a day (QID) | INTRAVENOUS | Status: DC | PRN

## 2022-06-18 MED ORDER — ROCURONIUM BROMIDE 10 MG/ML (PF) SYRINGE
PREFILLED_SYRINGE | INTRAVENOUS | Status: DC | PRN
  Administered 2022-06-18: 80 mg via INTRAVENOUS
  Administered 2022-06-18: 10 mg via INTRAVENOUS
  Administered 2022-06-18: 20 mg via INTRAVENOUS
  Administered 2022-06-18: 30 mg via INTRAVENOUS

## 2022-06-18 MED ORDER — ONDANSETRON HCL 4 MG/2ML IJ SOLN: 4.0000 mg | Freq: Four times a day (QID) | INTRAMUSCULAR | Status: DC | PRN

## 2022-06-18 MED ORDER — MIDAZOLAM HCL 2 MG/2ML IJ SOLN
INTRAMUSCULAR | Status: AC
  Filled 2022-06-18: qty 2

## 2022-06-18 MED ORDER — CHLORHEXIDINE GLUCONATE 0.12 % MT SOLN
15.0000 mL | Freq: Once | OROMUCOSAL | Status: AC
  Administered 2022-06-18: 15 mL via OROMUCOSAL

## 2022-06-18 MED ORDER — ONDANSETRON HCL 4 MG PO TABS: 4.0000 mg | ORAL_TABLET | Freq: Four times a day (QID) | ORAL | Status: DC | PRN

## 2022-06-18 MED ORDER — PRAMIPEXOLE DIHYDROCHLORIDE 0.25 MG PO TABS
0.5000 mg | ORAL_TABLET | Freq: Every day | ORAL | Status: DC
Start: 2022-06-18 — End: 2022-06-20
  Administered 2022-06-18 – 2022-06-19 (×2): 0.5 mg via ORAL
  Filled 2022-06-18 (×2): qty 2

## 2022-06-18 MED ORDER — BUPIVACAINE LIPOSOME 1.3 % IJ SUSP
INTRAMUSCULAR | Status: AC
  Filled 2022-06-18: qty 20

## 2022-06-18 MED ORDER — HYDRALAZINE HCL 20 MG/ML IJ SOLN: 10.0000 mg | INTRAMUSCULAR | Status: DC | PRN

## 2022-06-18 MED ORDER — ENOXAPARIN SODIUM 40 MG/0.4ML IJ SOSY
40.0000 mg | PREFILLED_SYRINGE | Freq: Once | INTRAMUSCULAR | Status: AC
  Administered 2022-06-18: 40 mg via SUBCUTANEOUS
  Filled 2022-06-18: qty 0.4

## 2022-06-18 MED ORDER — ARIPIPRAZOLE 10 MG PO TABS
10.0000 mg | ORAL_TABLET | Freq: Every day | ORAL | Status: DC
  Administered 2022-06-18 – 2022-06-20 (×3): 10 mg via ORAL
  Filled 2022-06-18 (×3): qty 1

## 2022-06-18 MED ORDER — MAGIC MOUTHWASH: 15.0000 mL | Freq: Four times a day (QID) | ORAL | Status: DC | PRN

## 2022-06-18 MED ORDER — ENSURE SURGERY PO LIQD
237.0000 mL | Freq: Two times a day (BID) | ORAL | Status: DC
  Administered 2022-06-20: 237 mL via ORAL

## 2022-06-18 MED ORDER — ONDANSETRON HCL 4 MG/2ML IJ SOLN
INTRAMUSCULAR | Status: AC
  Filled 2022-06-18: qty 2

## 2022-06-18 MED ORDER — INDOCYANINE GREEN 25 MG IV SOLR
INTRAVENOUS | Status: DC | PRN
  Administered 2022-06-18: 7.5 mg via INTRAVENOUS

## 2022-06-18 MED ORDER — KETAMINE HCL 50 MG/5ML IJ SOSY
PREFILLED_SYRINGE | INTRAMUSCULAR | Status: AC
  Filled 2022-06-18: qty 5

## 2022-06-18 MED ORDER — ALVIMOPAN 12 MG PO CAPS
12.0000 mg | ORAL_CAPSULE | Freq: Two times a day (BID) | ORAL | Status: DC
  Administered 2022-06-19 (×2): 12 mg via ORAL
  Filled 2022-06-18 (×3): qty 1

## 2022-06-18 MED ORDER — ALUM & MAG HYDROXIDE-SIMETH 200-200-20 MG/5ML PO SUSP: 30.0000 mL | Freq: Four times a day (QID) | ORAL | Status: DC | PRN

## 2022-06-18 MED ORDER — METOPROLOL TARTRATE 5 MG/5ML IV SOLN: 5.0000 mg | Freq: Four times a day (QID) | INTRAVENOUS | Status: DC | PRN

## 2022-06-18 MED ORDER — FAMOTIDINE 20 MG PO TABS
40.0000 mg | ORAL_TABLET | Freq: Every day | ORAL | Status: DC
  Administered 2022-06-18 – 2022-06-19 (×2): 40 mg via ORAL
  Filled 2022-06-18 (×2): qty 2

## 2022-06-18 MED ORDER — LOSARTAN POTASSIUM 50 MG PO TABS
100.0000 mg | ORAL_TABLET | Freq: Every day | ORAL | Status: DC
  Administered 2022-06-18 – 2022-06-19 (×2): 100 mg via ORAL
  Filled 2022-06-18 (×2): qty 2

## 2022-06-18 MED ORDER — INSULIN ASPART 100 UNIT/ML IJ SOLN: 0.0000 [IU] | Freq: Every day | INTRAMUSCULAR | Status: DC

## 2022-06-18 MED ORDER — AMLODIPINE BESYLATE 5 MG PO TABS
7.5000 mg | ORAL_TABLET | Freq: Every day | ORAL | Status: DC
  Administered 2022-06-19 – 2022-06-20 (×2): 7.5 mg via ORAL
  Filled 2022-06-18 (×2): qty 2

## 2022-06-18 MED ORDER — PANTOPRAZOLE SODIUM 40 MG PO TBEC
40.0000 mg | DELAYED_RELEASE_TABLET | Freq: Every morning | ORAL | Status: DC
  Administered 2022-06-19 – 2022-06-20 (×2): 40 mg via ORAL
  Filled 2022-06-18 (×2): qty 1

## 2022-06-18 MED ORDER — ONDANSETRON HCL 4 MG/2ML IJ SOLN: 4.0000 mg | Freq: Once | INTRAMUSCULAR | Status: DC | PRN

## 2022-06-18 MED ORDER — SODIUM CHLORIDE 0.9 % IV SOLN
2.0000 g | Freq: Two times a day (BID) | INTRAVENOUS | Status: AC
  Administered 2022-06-18: 2 g via INTRAVENOUS
  Filled 2022-06-18: qty 2

## 2022-06-18 MED ORDER — ADULT MULTIVITAMIN W/MINERALS CH
1.0000 | ORAL_TABLET | Freq: Every day | ORAL | Status: DC
Start: 2022-06-19 — End: 2022-06-20
  Administered 2022-06-19 – 2022-06-20 (×2): 1 via ORAL
  Filled 2022-06-18 (×2): qty 1

## 2022-06-18 MED ORDER — GABAPENTIN 400 MG PO CAPS
800.0000 mg | ORAL_CAPSULE | Freq: Three times a day (TID) | ORAL | Status: DC
  Administered 2022-06-18 – 2022-06-20 (×6): 800 mg via ORAL
  Filled 2022-06-18 (×6): qty 2

## 2022-06-18 MED ORDER — DIPHENHYDRAMINE HCL 12.5 MG/5ML PO ELIX: 12.5000 mg | ORAL_SOLUTION | Freq: Four times a day (QID) | ORAL | Status: DC | PRN

## 2022-06-18 MED ORDER — FLUTICASONE FUROATE-VILANTEROL 100-25 MCG/ACT IN AEPB
1.0000 | INHALATION_SPRAY | Freq: Every day | RESPIRATORY_TRACT | Status: DC
  Administered 2022-06-19 – 2022-06-20 (×2): 1 via RESPIRATORY_TRACT
  Filled 2022-06-18: qty 28

## 2022-06-18 MED ORDER — ORAL CARE MOUTH RINSE: 15.0000 mL | Freq: Once | OROMUCOSAL | Status: AC

## 2022-06-18 MED ORDER — FENTANYL CITRATE (PF) 250 MCG/5ML IJ SOLN
INTRAMUSCULAR | Status: AC
  Filled 2022-06-18: qty 5

## 2022-06-18 MED ORDER — DEXAMETHASONE SODIUM PHOSPHATE 10 MG/ML IJ SOLN
INTRAMUSCULAR | Status: AC
  Filled 2022-06-18: qty 1

## 2022-06-18 MED ORDER — ALBUTEROL SULFATE (2.5 MG/3ML) 0.083% IN NEBU: 2.5000 mg | INHALATION_SOLUTION | RESPIRATORY_TRACT | Status: DC | PRN

## 2022-06-18 SURGICAL SUPPLY — 122 items
APL PRP STRL LF DISP 70% ISPRP (MISCELLANEOUS) ×1
APPLIER CLIP 5 13 M/L LIGAMAX5 (MISCELLANEOUS)
APPLIER CLIP ROT 10 11.4 M/L (STAPLE)
APR CLP MED LRG 11.4X10 (STAPLE)
APR CLP MED LRG 5 ANG JAW (MISCELLANEOUS)
BAG COUNTER SPONGE SURGICOUNT (BAG) ×3 IMPLANT
BAG SPNG CNTER NS LX DISP (BAG) ×1
BLADE EXTENDED COATED 6.5IN (ELECTRODE) IMPLANT
CANNULA REDUC XI 12-8 STAPL (CANNULA)
CANNULA REDUCER 12-8 DVNC XI (CANNULA) IMPLANT
CELLS DAT CNTRL 66122 CELL SVR (MISCELLANEOUS) IMPLANT
CHLORAPREP W/TINT 26 (MISCELLANEOUS) ×1 IMPLANT
CLIP APPLIE 5 13 M/L LIGAMAX5 (MISCELLANEOUS) IMPLANT
CLIP APPLIE ROT 10 11.4 M/L (STAPLE) IMPLANT
COVER SURGICAL LIGHT HANDLE (MISCELLANEOUS) ×6 IMPLANT
COVER TIP SHEARS 8 DVNC (MISCELLANEOUS) ×2 IMPLANT
COVER TIP SHEARS 8MM DA VINCI (MISCELLANEOUS) ×2
DEVICE TROCAR PUNCTURE CLOSURE (ENDOMECHANICALS) IMPLANT
DRAIN CHANNEL 19F RND (DRAIN) ×1 IMPLANT
DRAPE ARM DVNC X/XI (DISPOSABLE) ×8 IMPLANT
DRAPE COLUMN DVNC XI (DISPOSABLE) ×2 IMPLANT
DRAPE DA VINCI XI ARM (DISPOSABLE) ×8
DRAPE DA VINCI XI COLUMN (DISPOSABLE) ×2
DRAPE SURG IRRIG POUCH 19X23 (DRAPES) ×3 IMPLANT
DRSG OPSITE POSTOP 4X10 (GAUZE/BANDAGES/DRESSINGS) IMPLANT
DRSG OPSITE POSTOP 4X6 (GAUZE/BANDAGES/DRESSINGS) ×1 IMPLANT
DRSG OPSITE POSTOP 4X8 (GAUZE/BANDAGES/DRESSINGS) IMPLANT
DRSG TEGADERM 2-3/8X2-3/4 SM (GAUZE/BANDAGES/DRESSINGS) ×15 IMPLANT
DRSG TEGADERM 4X4.75 (GAUZE/BANDAGES/DRESSINGS) IMPLANT
ELECT PENCIL ROCKER SW 15FT (MISCELLANEOUS) ×3 IMPLANT
ELECT REM PT RETURN 15FT ADLT (MISCELLANEOUS) ×3 IMPLANT
ENDOLOOP SUT PDS II  0 18 (SUTURE)
ENDOLOOP SUT PDS II 0 18 (SUTURE) IMPLANT
EVACUATOR SILICONE 100CC (DRAIN) ×1 IMPLANT
GAUZE SPONGE 2X2 8PLY STRL LF (GAUZE/BANDAGES/DRESSINGS) ×2 IMPLANT
GLOVE ECLIPSE 8.0 STRL XLNG CF (GLOVE) ×9 IMPLANT
GLOVE INDICATOR 8.0 STRL GRN (GLOVE) ×9 IMPLANT
GOWN SRG XL LVL 4 BRTHBL STRL (GOWNS) ×2 IMPLANT
GOWN STRL NON-REIN XL LVL4 (GOWNS) ×2
GOWN STRL REUS W/ TWL XL LVL3 (GOWN DISPOSABLE) ×8 IMPLANT
GOWN STRL REUS W/TWL XL LVL3 (GOWN DISPOSABLE) ×8
GRASPER SUT TROCAR 14GX15 (MISCELLANEOUS) IMPLANT
HOLDER FOLEY CATH W/STRAP (MISCELLANEOUS) ×3 IMPLANT
IRRIG SUCT STRYKERFLOW 2 WTIP (MISCELLANEOUS) ×2
IRRIGATION SUCT STRKRFLW 2 WTP (MISCELLANEOUS) ×2 IMPLANT
KIT PROCEDURE DA VINCI SI (MISCELLANEOUS) ×2
KIT PROCEDURE DVNC SI (MISCELLANEOUS) IMPLANT
KIT SIGMOIDOSCOPE (SET/KITS/TRAYS/PACK) ×1 IMPLANT
KIT TURNOVER KIT A (KITS) ×1 IMPLANT
MARKER SKIN DUAL TIP RULER LAB (MISCELLANEOUS) ×1 IMPLANT
NDL INSUFFLATION 14GA 120MM (NEEDLE) ×2 IMPLANT
NEEDLE INSUFFLATION 14GA 120MM (NEEDLE) ×2 IMPLANT
PACK CARDIOVASCULAR III (CUSTOM PROCEDURE TRAY) ×3 IMPLANT
PACK COLON (CUSTOM PROCEDURE TRAY) ×3 IMPLANT
PAD POSITIONING PINK XL (MISCELLANEOUS) ×3 IMPLANT
PROTECTOR NERVE ULNAR (MISCELLANEOUS) ×6 IMPLANT
RELOAD STAPLE 45 3.5 BLU DVNC (STAPLE) IMPLANT
RELOAD STAPLE 45 4.3 GRN DVNC (STAPLE) IMPLANT
RELOAD STAPLE 60 3.5 BLU DVNC (STAPLE) IMPLANT
RELOAD STAPLE 60 4.3 GRN DVNC (STAPLE) IMPLANT
RELOAD STAPLER 3.5X45 BLU DVNC (STAPLE) IMPLANT
RELOAD STAPLER 3.5X60 BLU DVNC (STAPLE) IMPLANT
RELOAD STAPLER 4.3X45 GRN DVNC (STAPLE) IMPLANT
RELOAD STAPLER 4.3X60 GRN DVNC (STAPLE) ×2 IMPLANT
RETRACTOR WND ALEXIS 18 MED (MISCELLANEOUS) IMPLANT
RTRCTR WOUND ALEXIS 18CM MED (MISCELLANEOUS)
SCISSORS LAP 5X35 DISP (ENDOMECHANICALS) ×3 IMPLANT
SEAL CANN UNIV 5-8 DVNC XI (MISCELLANEOUS) ×6 IMPLANT
SEAL XI 5MM-8MM UNIVERSAL (MISCELLANEOUS) ×6
SEALER VESSEL DA VINCI XI (MISCELLANEOUS) ×2
SEALER VESSEL EXT DVNC XI (MISCELLANEOUS) ×2 IMPLANT
SOLUTION ELECTROLUBE (MISCELLANEOUS) ×3 IMPLANT
SPIKE FLUID TRANSFER (MISCELLANEOUS) ×3 IMPLANT
SPONGE GAUZE 2X2 STER 10/PKG (GAUZE/BANDAGES/DRESSINGS) ×1
STAPLER 45 DA VINCI SURE FORM (STAPLE)
STAPLER 45 SUREFORM DVNC (STAPLE) IMPLANT
STAPLER 60 DA VINCI SURE FORM (STAPLE) ×2
STAPLER 60 SUREFORM DVNC (STAPLE) IMPLANT
STAPLER CANNULA SEAL DVNC XI (STAPLE) ×2 IMPLANT
STAPLER CANNULA SEAL XI (STAPLE) ×2
STAPLER ECHELON POWER CIR 29 (STAPLE) IMPLANT
STAPLER ECHELON POWER CIR 31 (STAPLE) ×1 IMPLANT
STAPLER RELOAD 3.5X45 BLU DVNC (STAPLE)
STAPLER RELOAD 3.5X45 BLUE (STAPLE)
STAPLER RELOAD 3.5X60 BLU DVNC (STAPLE)
STAPLER RELOAD 3.5X60 BLUE (STAPLE)
STAPLER RELOAD 4.3X45 GREEN (STAPLE)
STAPLER RELOAD 4.3X45 GRN DVNC (STAPLE)
STAPLER RELOAD 4.3X60 GREEN (STAPLE) ×4
STAPLER RELOAD 4.3X60 GRN DVNC (STAPLE) ×2
STOPCOCK 4 WAY LG BORE MALE ST (IV SETS) ×6 IMPLANT
SURGILUBE 2OZ TUBE FLIPTOP (MISCELLANEOUS) ×1 IMPLANT
SUT MNCRL AB 4-0 PS2 18 (SUTURE) ×3 IMPLANT
SUT PDS AB 1 CT1 27 (SUTURE) ×6 IMPLANT
SUT PROLENE 0 CT 2 (SUTURE) IMPLANT
SUT PROLENE 2 0 CT2 30 (SUTURE) ×2 IMPLANT
SUT PROLENE 2 0 KS (SUTURE) ×1 IMPLANT
SUT PROLENE 2 0 SH DA (SUTURE) ×1 IMPLANT
SUT SILK 2 0 (SUTURE)
SUT SILK 2 0 SH CR/8 (SUTURE) IMPLANT
SUT SILK 2-0 18XBRD TIE 12 (SUTURE) IMPLANT
SUT SILK 3 0 (SUTURE)
SUT SILK 3 0 SH CR/8 (SUTURE) ×3 IMPLANT
SUT SILK 3-0 18XBRD TIE 12 (SUTURE) IMPLANT
SUT V-LOC BARB 180 2/0GR6 GS22 (SUTURE)
SUT V-LOC BARB 180 2/0GR9 GS23 (SUTURE) ×4
SUT VIC AB 3-0 SH 18 (SUTURE) IMPLANT
SUT VIC AB 3-0 SH 27 (SUTURE)
SUT VIC AB 3-0 SH 27XBRD (SUTURE) IMPLANT
SUT VICRYL 0 UR6 27IN ABS (SUTURE) ×3 IMPLANT
SUTURE V-LC BRB 180 2/0GR6GS22 (SUTURE) IMPLANT
SUTURE V-LC BRB 180 2/0GR9GS23 (SUTURE) IMPLANT
SYR 10ML ECCENTRIC (SYRINGE) ×3 IMPLANT
SYS LAPSCP GELPORT 120MM (MISCELLANEOUS) ×2
SYS WOUND ALEXIS 18CM MED (MISCELLANEOUS) ×2
SYSTEM LAPSCP GELPORT 120MM (MISCELLANEOUS) IMPLANT
SYSTEM WOUND ALEXIS 18CM MED (MISCELLANEOUS) ×2 IMPLANT
TOWEL OR NON WOVEN STRL DISP B (DISPOSABLE) ×3 IMPLANT
TRAY FOLEY MTR SLVR 16FR STAT (SET/KITS/TRAYS/PACK) ×3 IMPLANT
TROCAR ADV FIXATION 5X100MM (TROCAR) ×3 IMPLANT
TUBING CONNECTING 10 (TUBING) ×6 IMPLANT
TUBING INSUFFLATION 10FT LAP (TUBING) ×3 IMPLANT

## 2022-06-18 NOTE — Op Note (Signed)
06/18/2022  11:31 AM  PATIENT:  [name]  66 y.o. female  Patient Care Team: Entzminger, Mendel Corning, MD as PCP - General (Internal Medicine) Chesley Mires, MD as Consulting Physician (Pulmonary Disease) Minus Breeding, MD as Consulting Physician (Cardiology) Alexis Goodell, MD as Consulting Physician (Neurology) Gillis Santa, MD as Consulting Physician (Pain Medicine) Marty Heck, MD as Consulting Physician (Vascular Surgery) Lesly Rubenstein, MD as Consulting Physician (Gastroenterology) Marti Sleigh, MD as Referring Physician (Urology) Erby Pian, MD as Referring Physician (Pulmonary Disease) Michael Boston, MD as Consulting Physician (Colon and Rectal Surgery) Lesly Rubenstein, MD as Consulting Physician (Gastroenterology)  PRE-OPERATIVE DIAGNOSIS:  RECTAL PROLAPSE  POST-OPERATIVE DIAGNOSIS:  RECTAL PROLAPSE  PROCEDURE:   ROBOTIC LOW ANTERIOR RECTOSIGMOID RESECTION ROBOTIC RECTOPEXY EXCISION OF LEFT ADNEXAL MASS (probable cyst) EXCISION OF UTERINE MASS (probable fibroid) ASSESSMENT OF TISSUE PERFUSSION VIA FIREFLY INJECTION, TRANSVERSUS ABDOMINIS PLANE (TAP) BLOCK - BILATERAL RIGID PROCTOSCOPY  SURGEON:  Michael Boston, MD, FACS, MASCRS  ASSISTANT:  CHRISTOPHER WHITE, MD An experienced assistant was required given the standard of surgical care given the complexity of the case.  This assistant was needed for exposure, dissection, suction, tissue approximation, retraction, perception, etc.  ANESTHESIA:     General  Nerve block provided with liposomal bupivacaine (Experel) mixed with 0.25% bupivacaine as a Bilateral TAP block x 80m each side at the level of the transverse abdominis & preperitoneal spaces along the flank at the anterior axillary line, from subcostal ridge to iliac crest under laparoscopic guidance   Local field block at port sites & extraction wound  EBL:  Total I/O In: 2100 [I.V.:2000; IV Piggyback:100] Out: 945  [Urine:795; Blood:150]  Delay start of Pharmacological VTE agent (>24hrs) due to surgical blood loss or risk of bleeding:  no  DRAINS: 19 Fr Blake drain with tip in the presacral space.  Then wraps around the anterior pelvis.  SPECIMEN:   Descending colon to proximal rectum.  Open end proximal Distal anastomotic ring of rectum Left adnexal cystic mass.  Probable ovarian cyst Left anterior uterine mass.  Most likely pedunculated fibroid.  DISPOSITION OF SPECIMEN:  PATHOLOGY  COUNTS:  YES  PLAN OF CARE: Admit to inpatient   PATIENT DISPOSITION:  PACU - hemodynamically stable.  INDICATION: Pleasant woman struggled with chronic constipation and numerous health issues.  COPD.  Developed worsening prolapse.  Now completely prolapsed out.  Fecal incontinence.  Frustrated.  Not low risk for surgery but given severe incontinence and frustration cleared to proceed with surgery.  Recommendation made for robotic rectopexy.  Probable low anterior rectosigmoid resection if there is significant redundancy.  The anatomy & physiology of the digestive tract was discussed.  The pathophysiology of rectal prolapse was discussed.  Natural history risks without surgery was discussed.   I feel the risks of no intervention will lead to serious problems that outweigh the operative risks; therefore, I recommended surgery to treat the pathology.  Possible need for sigmoid colectomy to remove redundant colon was discussed.  Pexy by suture and probable mesh reinforcement was discussed as well.  Laparoscopic & open techniques were discussed.   Risks such as bleeding, infection, abscess, leak, reoperation, possible ostomy, hernia, stroke, heart attack, death, and other risks were discussed.   I noted a good likelihood this will help address the problem.  Goals of post-operative recovery were discussed as well.  We will work to minimize complications.  An educational handout on the technique was given as well.  Questions  were  answered.  The patient expresses understanding & wishes to proceed with surgery.   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  Colonoscopy yesterday showing 1 small rectal polyp but no strong evidence of major proctitis or tumor.  Very redundant left-sided colon.  Tube feed resection done.  Mid descending to proximal rectal anastomosis done.  Prolene posterior suture rectopexy as well as V-lock 2-0 lateral rectopexy done.  It is a 31 EEA stapled anastomosis that rests 10 cm from the anal verge.  Very poor sphincter tone.  CASE DATA:  Type of patient?: Elective [facility] Private Case  Status of Case? Elective Scheduled  Infection Present At Time Of Surgery (PATOS)?  NO  DESCRIPTION:   Informed consent was confirmed.  The patient underwent general anaesthesia without difficulty.  The patient was positioned appropriately.  VTE prevention in place.  Of note her rectum was completely prolapsed out at least 6 inches and cannot be reduced back and with decreased but intact sphincter tone.  The patient's abdomen was clipped, prepped, & draped in a sterile fashion.  Surgical timeout confirmed our plan.  Peritoneal entry with a laparoscopic port was obtained using Varess spring needle entry technique in the left upper abdomen as the patient was positioned in reverse Trendelenburg.  I induced carbon dioxide insufflation.  No change in end tidal CO2 measurements.  Full symmetrical abdominal distention.  Initial port was carefully placed.  Camera inspection revealed no injury.  Extra ports were carefully placed under direct laparoscopic visualization.  We positioned the patient head down and evacuated the colon and the small bowel out of the pelvis.  I scored the peritoneum at the rectosigmoid region at the level of the sacral promontory and continued scoring of peritoneum using hook cautery down to the right peritoneal reflection.  I  left the mobile stretched out rectosigmoid mesentery anteriorly and got into the presacral plane.  I followed blunt dissection down the sacrum and down towards the levators for good posterior rectal mobilzation.  I dissected more proximally.  I elevated the sigmoid mesentery a little bit.  I identified the left ureter, kept that posteriorly in the retroperitioneum.  I identified the left gonadals and kept those posterior as well.  I tried to avoid any more dissection more proximally to avoid disturbing any further adhesions.  I then took some of the peritoneal reflection on the left rectosigmoid side going up the pelvic brim to the anterior reflection.  I then came around anteriorly to help mobilize the anterior rectum off its anterior attachments for good anterior rectal mobilization off the rectovaginal septum.  Came all the way to the anterior pelvic floor.  Switch back posteriorly confirmed that I had freed everything off the posterior rectal floor.  I did digital rectal examination confirmed that I was at the level of the sphincters for very distal anterior and posterior mobilization.  With that, I could get the mid rectum to stretch up to the sacral promontory to help straighten out the stretched out rectum.  I kept the lateral pedicles intact on both sides at mid and distal rectum.  Of note she had a cystic mass in her left neck so that was adherent to the region that I transected off.  Most likely a atypical ovarian cyst.  I also had a pedunculated mass on the anterior uterus somewhat firm parotic and irritated.  Transected and with the vessel sealer as well.  The specimens were later removed.  I created a window between the proximal and mid rectal regions where it reached to the sacral promontory under some mild tension.  Transected through the mesorectum and rectal tissues in that region.  I then chose a region in the mid descending colon that could reach down to the sacral promontory with minimal  tension.  I transected through the left mesentery preserving the main IMA and IMV pedicles.  To access vascular perfusion of tissues, we asked anesthesia use intravenous  indocyanine green (ICG) with IV flush.  I switched to the NIR fluorescence (Firefly mode) imaging window on the daVinci robot platform.  We were able to see good light green visualization of blood vessels with good vascular perfusion of tissues, confirming good tissue perfusion of tissues (mid descending colon and proximal/mid rectum) planned for anastomosis.  Transected at the proximal/mid rectal junction vertically using green load 60 mm robotic stapler.  First firing went 90% across.  Another firing was done for the inferior portion.  I then did a rectopexy of the rectal stump using 0 Prolene suture through the posterior lateral rectum and lateral sacral promontory, 1 for right and left sides.  Held those untied.  We created an extraction incision through a small Pfannenstiel incision in the suprapubic region.  Placed a wound protector.  I was able to eviscerate the rectosigmoid and descending colon out the wound.   I clamped the colon proximal to this area using a reusable pursestringer device.  Passed a 2-0 Keith needle. I transected at the descending/sigmoid junction with a scalpel. I got healthy bleeding mucosa.  We sent the rectosigmoid colon specimen off to go to pathology.  We sized the colon orifice.  I chose a 65m EEA anvil stapler system.  I reinforced the prolene pursestring with interrupted silk "belt loop" sutures.  I placed the anvil to the open end of the proximal remaining colon and closed around it using the pursestring.    We did copious irrigation with crystalloid solution.  Hemostasis was good.  The distal end of the remaining colon easily reached down to the rectal stump.  Did not want to do any more mobilization to prevent further prolapsing tissue.  The robot was redocked.  Dr WDema Severinscrubbed down and did gentle anal  dilation and advanced the EEA stapler up the rectal stump. The spike was brought out at the provimal end of the rectal stump under direct visualization.  I  attached the anvil of the proximal colon the spike of the stapler. Anvil was tightened down and held clamped for 60 seconds.  Orientation was confirmed such that there is no twisting of the colon nor small bowel underneath the mesenteric defect. No concerning tension.  The EEA stapler was fired and held clamped for 30 seconds. The stapler was released & removed. Blue stitch is in the proximal ring.  Care was taken to ensure no other structures were incorporated within this either.  We noted 2 excellent anastomotic rings.   The colon proximal to the anastomosis was then gently occluded. The pelvis was filled with sterile irrigation.  Dr WDema Severin did rigid proctoscopy noted the anastomosis was at 10 cm from the anal verge consistent with the proximal rectum.  There was a negative air leak test. There was no tension of mesentery or bowel at the anastomosis.   Tissues looked viable.  Ureters & bowel uninjured.  The anastomosis looked healthy.  I tied down the Prolene sutures for good posterior rectopexy.  I then used  2 -0 V lock serrated absorbable suture for each side for a lateral closure/rectopexy.  Sutured from the right anterior perineal fraction more proximally and left anterior reflection more proximally.  Placed a drain prior to closing the right peritoneal reflection fully.  Greater omentum positioned down into the pelvis to help protect the anastomosis.  Endoluminal gas was evacuated.  Ports & wound protector removed.  We changed gloves & redraped the patient per colon SSI prevention protocol.  We aspirated the sterile irrigation.  Hemostasis was good.  Sterile unused instruments were used from this point.  I closed the skin at the port sites using Monocryl stitch and sterile dressing.  We assured hemostasis and the former ostomy wound.  Wound irrigated.   I closed the posterior rectus fascia with 0 Vicryl suture.  Anterior rectus fascia was closed using #1 PDS transversely.  Sterile dressing placed.   Patient is being extubated go to recovery room. I had discussed postop care with the patient in detail the office & in the holding area. Instructions are written. I discussed operative findings, updated the patient's status, discussed probable steps to recovery, and gave postoperative recommendations to the patient's significant other.  Recommendations were made.  Questions were answered.  He expressed understanding & appreciation.  Adin Hector, MD, FACS, MASCRS Esophageal, Gastrointestinal & Colorectal Surgery Robotic and Minimally Invasive Surgery  [facility] [address] [place],  [address] [phone] Fax [phone] Main  CONTACT INFORMATION:  Weekday (9AM-5PM): Call [facility] main office at [phone]  Weeknight (5PM-9AM) or Weekend/Holiday: Check [email or web address] (password " [email or web address]") for General Surgery [facility] coverage  (Please, do not use SecureChat as it is not reliable communication to operating surgeons for immediate patient care)

## 2022-06-18 NOTE — Transfer of Care (Signed)
Immediate Anesthesia Transfer of Care Note  Patient: [name]  Procedure(s) Performed: ROBOTIC LOW ANTERIOR RESECTION OF COLON, RECTOSIGMOID, ROBOTIC ASSISTED RECTOPEXY, BILATERAL TAP BLOCK, ASSESSMENT OF TISSUE PERFUSSION VIA FIREFLY INJECTION, EXCISION OF ADNESAL MASS AND UTERINE MASS RIGID PROCTOSCOPY  Patient Location: PACU  Anesthesia Type:General  Level of Consciousness: drowsy and patient cooperative  Airway & Oxygen Therapy: Patient Spontanous Breathing and Patient connected to face mask oxygen  Post-op Assessment: Report given to RN and Post -op Vital signs reviewed and stable  Post vital signs: Reviewed and stable  Last Vitals:  Vitals Value Taken Time  BP 131/74 06/18/22 1119  Temp    Pulse 68 06/18/22 1124  Resp 17 06/18/22 1124  SpO2 98 % 06/18/22 1124  Vitals shown include unvalidated device data.  Last Pain:  Vitals:   06/18/22 0639  TempSrc:   PainSc: 5       Patients Stated Pain Goal: 4 (29/93/71 [id number])  Complications: No notable events documented.

## 2022-06-18 NOTE — Interval H&P Note (Signed)
History and Physical Interval Note:  06/18/2022 7:02 AM  [name]  has presented today for surgery, with the diagnosis of RECTAL PROLAPSE.  The various methods of treatment have been discussed with the patient and family. After consideration of risks, benefits and other options for treatment, the patient has consented to  Procedure(s): ROBOTIC RESECTION OF COLON, RECTOSIGMOID, RECTOPEXY (N/A) POSSIBLE OSTOMY (N/A) RIGID PROCTOSCOPY (N/A) as a surgical intervention.  The patient's history has been reviewed, patient examined, no change in status, stable for surgery.  I have reviewed the patient's chart and labs.  Questions were answered to the patient's satisfaction.    I have re-reviewed the the patient's records, history, medications, and allergies.  I have re-examined the patient.  I again discussed intraoperative plans and goals of post-operative recovery.  The patient agrees to proceed.  [name]  02-06-1956 [id number]  Patient Care Team: Entzminger, Mendel Corning, MD as PCP - General (Internal Medicine) Chesley Mires, MD as Consulting Physician (Pulmonary Disease) Minus Breeding, MD as Consulting Physician (Cardiology) Alexis Goodell, MD as Consulting Physician (Neurology) Gillis Santa, MD as Consulting Physician (Pain Medicine) Marty Heck, MD as Consulting Physician (Vascular Surgery) Lesly Rubenstein, MD as Consulting Physician (Gastroenterology) Marti Sleigh, MD as Referring Physician (Urology) Erby Pian, MD as Referring Physician (Pulmonary Disease) Michael Boston, MD as Consulting Physician (Colon and Rectal Surgery) Lesly Rubenstein, MD as Consulting Physician (Gastroenterology)  Patient Active Problem List   Diagnosis Date Noted   (HFpEF) heart failure with preserved ejection fraction ([place]) 04/06/2022   GERD (gastroesophageal reflux disease) 04/06/2022   Restless leg syndrome 04/06/2022   Seizures ([place]) 04/06/2022   Vomiting without  nausea 04/06/2022   CKD (chronic kidney disease) stage 3, GFR 30-59 ml/min ([place]) 04/06/2022   Delayed gastric emptying 04/06/2022   Anxiety and depression 01/25/2021   Acute on chronic respiratory failure with hypoxia ([place]) 11/17/2020   Protein calorie malnutrition ([place]) 11/17/2020   Hypothyroidism 11/17/2020   Leg pain 02/05/2020   CHF exacerbation ([place]) 12/28/2019   Elevated troponin 12/28/2019   Myofascial pain syndrome 08/23/2018   Lumbar spondylosis 08/23/2018   Leg swelling 07/31/2018   SOB (shortness of breath) 07/31/2018   Tobacco abuse 07/31/2018   Fusion of lumbar spine (L3-S1) 07/20/2018   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   Lumbar degenerative disc disease 07/20/2018   Drug overdose 04/18/2014   Chronic pain syndrome 04/18/2014   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 ([place]) 04/15/2014   Respiratory failure ([place]) 04/14/2014   Acute respiratory failure ([place]) 04/14/2014   UTI (urinary tract infection) 04/28/2013   Acute renal failure (ARF) ([place]) 04/28/2013   Weakness generalized 04/28/2013   Sepsis ([place]) 04/28/2013   Chronic diastolic CHF (congestive heart failure) ([place]) 04/28/2013   Hypokalemia 01/11/2013   Sinus tachycardia 01/11/2013   Acute respiratory failure with hypoxia ([place]) 01/10/2013   COPD exacerbation ([place]) 01/10/2013   Community acquired pneumonia 01/10/2013   Diabetes mellitus, type 2 ([place]) 54/27/0623   Diastolic CHF, acute on chronic ([place]) 01/10/2013   Osteoarthritis of left knee 10/27/2011   Hypoxemia 03/29/2011   Acute renal failure ([place]) 03/29/2011   COPD (chronic obstructive pulmonary disease) ([place]) 03/29/2011   Hypertension 03/29/2011   Hypotension 03/25/2011    Past Medical History:  Diagnosis Date   (HFpEF) heart failure with preserved ejection fraction ([place])    Acute respiratory distress 11/2020   Anxiety    Arthritis    CHF (congestive heart failure) ([place])  Chronic respiratory failure with hypoxia  and hypercapnia (H[facility])    COPD (chronic obstructive pulmonary disease) (H[facility])    no inhalers--smoker, no oxygen   Depression    Dyspnea    Emphysema lung (HCC)    GERD (gastroesophageal reflux disease)    Hyperlipidemia    Hypertension    Hypothyroid    Peripheral vascular disease (HCC)    Pre-diabetes    Restless leg syndrome    Seizures ([place])    02/13/2011 -hx of seizure due to "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  no seizure since--pt does take keppra and office note from neurologist dr. Leta Baptist on this chart   Urinary incontinence     Past Surgical History:  Procedure Laterality Date   CARPAL TUNNEL RELEASE     left   JOINT REPLACEMENT  08/26/2011   right total knee arthroplasty, left total knee   KNEE ARTHROPLASTY  10/28/2011   Procedure: COMPUTER ASSISTED TOTAL KNEE ARTHROPLASTY;  Surgeon: Karen Chafe Rendall III;  Location: [facility];  Service: Orthopedics;  Laterality: Left;  preop femoral nerve block   KNEE SURGERY     right   laminectomy and diskectomy     L4-5 with fusion    Social History   Socioeconomic History   Marital status: Widowed    Spouse name: Not on file   Number of children: Not on file   Years of education: Not on file   Highest education level: Not on file  Occupational History   Not on file  Tobacco Use   Smoking status: Every Day    Packs/day: 1.00    Years: 48.00    Total pack years: 48.00    Types: Cigarettes   Smokeless tobacco: Never  Vaping Use   Vaping Use: Every day   Substances: Nicotine  Substance and Sexual Activity   Alcohol use: Yes    Comment: occasional   Drug use: No   Sexual activity: Yes    Birth control/protection: Post-menopausal  Other Topics Concern   Not on file  Social History Narrative   ** Merged History Encounter **       Social Determinants of Health   Financial Resource Strain: Not on file  Food  Insecurity: No Food Insecurity (11/18/2020)   Hunger Vital Sign    Worried About Running Out of Food in the Last Year: Never true    Ran Out of Food in the Last Year: Never true  Transportation Needs: No Transportation Needs (11/18/2020)   PRAPARE - Hydrologist (Medical): No    Lack of Transportation (Non-Medical): No  Physical Activity: Not on file  Stress: Not on file  Social Connections: Not on file  Intimate Partner Violence: Not on file    Family History  Problem Relation Age of Onset   Heart attack Father 65   Alzheimer's disease Mother    Breast cancer Neg Hx     Medications Prior to Admission  Medication Sig Dispense Refill Last Dose   acidophilus (RISAQUAD) CAPS capsule Take 1 capsule by mouth daily.   06/17/2022   amLODipine (NORVASC) 5 MG tablet Take 7.5 mg by mouth daily.   06/18/2022 at 0400   ARIPiprazole (ABILIFY) 10 MG tablet Take 10 mg by mouth daily.   06/17/2022   aspirin 81 MG EC tablet Take 81 mg by mouth daily.    Past  Week   busPIRone (BUSPAR) 30 MG tablet Take 30 mg by mouth 2 (two) times daily.   06/18/2022 at 0400   cetirizine (ZYRTEC) 10 MG tablet Take 10 mg by mouth daily as needed for allergies or rhinitis.   06/17/2022   escitalopram (LEXAPRO) 20 MG tablet Take 30 mg by mouth daily.   06/18/2022 at 0400   famotidine (PEPCID) 40 MG tablet Take 40 mg by mouth at bedtime.   06/17/2022   fenofibrate micronized (LOFIBRA) 134 MG capsule Take 134 mg by mouth daily before breakfast.    06/17/2022   fluticasone (FLONASE) 50 MCG/ACT nasal spray Place 1 spray into both nostrils daily as needed for allergies or rhinitis.   Past Week   Fluticasone-Umeclidin-Vilant (TRELEGY ELLIPTA) 100-62.5-25 MCG/ACT AEPB Inhale 1 puff into the lungs daily.   06/18/2022 at 0400   furosemide (LASIX) 40 MG tablet Take 1 tablet (40 mg total) by mouth daily. May take additional 40 mg (one tablet) as needed for swelling, weight gain or sob 30 tablet 0    gabapentin (NEURONTIN)  800 MG tablet Take 800 mg by mouth 3 (three) times daily.    06/18/2022 at 0400   levETIRAcetam (KEPPRA) 500 MG tablet Take 1 tablet (500 mg total) by mouth 2 (two) times daily. (Patient taking differently: Take 500 mg by mouth daily.) 60 tablet 0 06/18/2022 at 0400   levothyroxine (SYNTHROID, LEVOTHROID) 25 MCG tablet Take 25 mcg by mouth daily before breakfast.    06/18/2022 at 0400   loperamide (IMODIUM) 2 MG capsule Take 12 mg by mouth as needed for diarrhea or loose stools.   06/17/2022   losartan (COZAAR) 100 MG tablet Take 100 mg by mouth daily.    06/17/2022   Menthol, Topical Analgesic, (BIOFREEZE) 10 % CREA Apply 1 Application topically daily as needed (pain).   06/17/2022   Multiple Vitamins-Minerals (CENTRUM SILVER 50+WOMEN) TABS Take 1 tablet by mouth daily.   06/17/2022   OXYGEN Inhale 3 L into the lungs at bedtime.   06/17/2022   pantoprazole (PROTONIX) 40 MG tablet Take 40 mg by mouth every morning.    06/18/2022 at 0400   pramipexole (MIRAPEX) 0.5 MG tablet Take 0.5 mg by mouth at bedtime.   06/17/2022   Vibegron (GEMTESA) 75 MG TABS Take 75 mg by mouth daily. 30 tablet 0 06/18/2022 at 0400   albuterol (VENTOLIN HFA) 108 (90 Base) MCG/ACT inhaler Inhale 2 puffs into the lungs every 4 (four) hours as needed for wheezing or shortness of breath. 18 g 11 More than a month   umeclidinium-vilanterol (ANORO ELLIPTA) 62.5-25 MCG/INH AEPB Inhale 1 puff into the lungs daily. (Patient not taking: Reported on 06/01/2022) 1 each 11 Not Taking    Current Facility-Administered Medications  Medication Dose Route Frequency Provider Last Rate Last Admin   bupivacaine liposome (EXPAREL) 1.3 % injection 266 mg  20 mL Infiltration Once Michael Boston, MD       cefoTEtan (CEFOTAN) 2 g in sodium chloride 0.9 % 100 mL IVPB  2 g Intravenous On Call to OR Michael Boston, MD       gabapentin (NEURONTIN) capsule 300 mg  300 mg Oral On Call to OR Michael Boston, MD       lactated ringers infusion   Intravenous Continuous Brennan Bailey, MD 10 mL/hr at 06/18/22 9562 Continued from Pre-op at 06/18/22 0657   Facility-Administered Medications Ordered in Other Encounters  Medication Dose Route Frequency Provider Last Rate Last Admin   chlorhexidine (  HIBICLENS) 4 % liquid 4 application  60 mL Topical Once Duffy, Kellie Shropshire, PA-C       chlorhexidine (HIBICLENS) 4 % liquid 4 application  60 mL Topical Once Duffy, Kellie Shropshire, PA-C         Allergies  Allergen Reactions   Nsaids Other (See Comments)    Pt states it messes up her kidneys   Vicodin [Hydrocodone-Acetaminophen] Nausea And Vomiting   [place]-Hydran Other (See Comments)   Thiazide-Type Diuretics     Other reaction(s): Unknown    BP (!) 134/54   Pulse 78   Temp 98.8 F (37.1 C) (Oral)   Resp 20   Ht '5\' 6"'$  (1.676 m)   Wt 80.7 kg   SpO2 93%   BMI 28.72 kg/m   Labs: Results for orders placed or performed during the hospital encounter of 06/18/22 (from the past 48 hour(s))  Glucose, capillary     Status: Abnormal   Collection Time: 06/18/22  6:10 AM  Result Value Ref Range   Glucose-Capillary 129 (H) 70 - 99 mg/dL    Comment: Glucose reference range applies only to samples taken after fasting for at least 8 hours.   Comment 1 Notify RN    Comment 2 Document in Chart     Imaging / Studies: No results found.   Adin Hector, M.D., F.A.C.S. Gastrointestinal and Minimally Invasive Surgery [facility] [address] [place], [place] [address] [phone] Main / Paging  06/18/2022 7:02 AM    Adin Hector

## 2022-06-18 NOTE — Anesthesia Postprocedure Evaluation (Signed)
Anesthesia Post Note  Patient: [name]  Procedure(s) Performed: ROBOTIC LOW ANTERIOR RESECTION OF COLON, RECTOSIGMOID, ROBOTIC ASSISTED RECTOPEXY, BILATERAL TAP BLOCK, ASSESSMENT OF TISSUE PERFUSSION VIA FIREFLY INJECTION, EXCISION OF ADNESAL MASS AND UTERINE MASS RIGID PROCTOSCOPY     Patient location during evaluation: PACU Anesthesia Type: General Level of consciousness: awake and alert and oriented Pain management: pain level controlled Vital Signs Assessment: post-procedure vital signs reviewed and stable Respiratory status: spontaneous breathing, nonlabored ventilation and respiratory function stable Cardiovascular status: blood pressure returned to baseline and stable Postop Assessment: no apparent nausea or vomiting Anesthetic complications: no   No notable events documented.  Last Vitals:  Vitals:   06/18/22 1200 06/18/22 1215  BP: (!) 143/76 (!) 147/84  Pulse: 62 (!) 57  Resp: 17 14  Temp:    SpO2: 97% 93%    Last Pain:  Vitals:   06/18/22 1215  TempSrc:   PainSc: Asleep                 Andrius Andrepont A.

## 2022-06-18 NOTE — Discharge Instructions (Signed)
SURGERY: POST OP INSTRUCTIONS (Surgery for small bowel obstruction, colon resection, etc)   ######################################################################  EAT Gradually transition to a high fiber diet with a fiber supplement over the next few days after discharge  WALK Walk an hour a day.  Control your pain to do that.    CONTROL PAIN Control pain so that you can walk, sleep, tolerate sneezing/coughing, go up/down stairs.  HAVE A BOWEL MOVEMENT DAILY Keep your bowels regular to avoid problems.  OK to try a laxative to override constipation.  OK to use an antidairrheal to slow down diarrhea.  Call if not better after 2 tries  CALL IF YOU HAVE PROBLEMS/CONCERNS Call if you are still struggling despite following these instructions. Call if you have concerns not answered by these instructions  ######################################################################   DIET Follow a light diet the first few days at home.  Start with a bland diet such as soups, liquids, starchy foods, low fat foods, etc.  If you feel full, bloated, or constipated, stay on a ful liquid or pureed/blenderized diet for a few days until you feel better and no longer constipated. Be sure to drink plenty of fluids every day to avoid getting dehydrated (feeling dizzy, not urinating, etc.). Gradually add a fiber supplement to your diet over the next week.  Gradually get back to a regular solid diet.  Avoid fast food or heavy meals the first week as you are more likely to get nauseated. It is expected for your digestive tract to need a few months to get back to normal.  It is common for your bowel movements and stools to be irregular.  You will have occasional bloating and cramping that should eventually fade away.  Until you are eating solid food normally, off all pain medications, and back to regular activities; your bowels will not be normal. Focus on eating a low-fat, high fiber diet the rest of your life  (See Getting to [place], below).  CARE of your INCISION or WOUND  It is good for closed incisions and even open wounds to be washed every day.  Shower every day.  Short baths are fine.  Wash the incisions and wounds clean with soap & water.    You may leave closed incisions open to air if it is dry.   You may cover the incision with clean gauze & replace it after your daily shower for comfort.  TEGADERM:  You have clear gauze band-aid dressings over your closed incision(s).  Remove the dressings 3 days after surgery. = 7/10 Monday    If you have an open wound with a wound vac, see wound vac care instructions.    ACTIVITIES as tolerated Start light daily activities --- self-care, walking, climbing stairs-- beginning the day after surgery.  Gradually increase activities as tolerated.  Control your pain to be active.  Stop when you are tired.  Ideally, walk several times a day, eventually an hour a day.   Most people are back to most day-to-day activities in a few weeks.  It takes 4-8 weeks to get back to unrestricted, intense activity. If you can walk 30 minutes without difficulty, it is safe to try more intense activity such as jogging, treadmill, bicycling, low-impact aerobics, swimming, etc. Save the most intensive and strenuous activity for last (Usually 4-8 weeks after surgery) such as sit-ups, heavy lifting, contact sports, etc.  Refrain from any intense heavy lifting or straining until you are off narcotics for pain control.  You will have  off days, but things should improve week-by-week. DO NOT PUSH THROUGH PAIN.  Let pain be your guide: If it hurts to do something, don't do it.  Pain is your body warning you to avoid that activity for another week until the pain goes down. You may drive when you are no longer taking narcotic prescription pain medication, you can comfortably wear a seatbelt, and you can safely make sudden turns/stops to protect yourself without hesitating due to  pain. You may have sexual intercourse when it is comfortable. If it hurts to do something, stop.  MEDICATIONS Take your usually prescribed home medications unless otherwise directed.   Blood thinners:  Usually you can restart any strong blood thinners after the second postoperative day.  It is OK to take aspirin right away.     If you are on strong blood thinners (warfarin/Coumadin, Plavix, Xerelto, Eliquis, Pradaxa, etc), discuss with your surgeon, medicine PCP, and/or cardiologist for instructions on when to restart the blood thinner & if blood monitoring is needed (PT/INR blood check, etc).     PAIN CONTROL Pain after surgery or related to activity is often due to strain/injury to muscle, tendon, nerves and/or incisions.  This pain is usually short-term and will improve in a few months.  To help speed the process of healing and to get back to regular activity more quickly, DO THE FOLLOWING THINGS TOGETHER: Increase activity gradually.  DO NOT PUSH THROUGH PAIN Use Ice and/or Heat Try Gentle Massage and/or Stretching Take over the counter pain medication Take Narcotic prescription pain medication for more severe pain  Good pain control = faster recovery.  It is better to take more medicine to be more active than to stay in bed all day to avoid medications.  Increase activity gradually Avoid heavy lifting at first, then increase to lifting as tolerated over the next 6 weeks. Do not "push through" the pain.  Listen to your body and avoid positions and maneuvers than reproduce the pain.  Wait a few days before trying something more intense Walking an hour a day is encouraged to help your body recover faster and more safely.  Start slowly and stop when getting sore.  If you can walk 30 minutes without stopping or pain, you can try more intense activity (running, jogging, aerobics, cycling, swimming, treadmill, sex, sports, weightlifting, etc.) Remember: If it hurts to do it, then don't do  it! Use Ice and/or Heat You will have swelling and bruising around the incisions.  This will take several weeks to resolve. Ice packs or heating pads (6-8 times a day, 30-60 minutes at a time) will help sooth soreness & bruising. Some people prefer to use ice alone, heat alone, or alternate between ice & heat.  Experiment and see what works best for you.  Consider trying ice for the first few days to help decrease swelling and bruising; then, switch to heat to help relax sore spots and speed recovery. Shower every day.  Short baths are fine.  It feels good!  Keep the incisions and wounds clean with soap & water.   Try Gentle Massage and/or Stretching Massage at the area of pain many times a day Stop if you feel pain - do not overdo it Take over the counter pain medication This helps the muscle and nerve tissues become less irritable and calm down faster Choose ONE of the following over-the-counter anti-inflammatory medications: Acetaminophen '500mg'$  tabs (Tylenol) 1-2 pills with every meal and just before bedtime (avoid if you have  liver problems or if you have acetaminophen in you narcotic prescription) Naproxen '220mg'$  tabs (ex. Aleve, Naprosyn) 1-2 pills twice a day (avoid if you have kidney, stomach, IBD, or bleeding problems) Ibuprofen '200mg'$  tabs (ex. Advil, Motrin) 3-4 pills with every meal and just before bedtime (avoid if you have kidney, stomach, IBD, or bleeding problems) Take with food/snack several times a day as directed for at least 2 weeks to help keep pain / soreness down & more manageable. Take Narcotic prescription pain medication for more severe pain A prescription for strong pain control is often given to you upon discharge (for example: oxycodone/Percocet, hydrocodone/Norco/Vicodin, or tramadol/Ultram) Take your pain medication as prescribed. Be mindful that most narcotic prescriptions contain Tylenol (acetaminophen) as well - avoid taking too much Tylenol. If you are having  problems/concerns with the prescription medicine (does not control pain, nausea, vomiting, rash, itching, etc.), please call us [phone] to see if we need to switch you to a different pain medicine that will work better for you and/or control your side effects better. If you need a refill on your pain medication, you must call the office before 4 pm and on weekdays only.  By federal law, prescriptions for narcotics cannot be called into a pharmacy.  They must be filled out on paper & picked up from our office by the patient or authorized caretaker.  Prescriptions cannot be filled after 4 pm nor on weekends.    WHEN TO CALL us [phone] Severe uncontrolled or worsening pain  Fever over 101 F (38.5 C) Concerns with the incision: Worsening pain, redness, rash/hives, swelling, bleeding, or drainage Reactions / problems with new medications (itching, rash, hives, nausea, etc.) Nausea and/or vomiting Difficulty urinating Difficulty breathing Worsening fatigue, dizziness, lightheadedness, blurred vision Other concerns If you are not getting better after two weeks or are noticing you are getting worse, contact our office [phone] for further advice.  We may need to adjust your medications, re-evaluate you in the office, send you to the emergency room, or see what other things we can do to help. The clinic staff is available to answer your questions during regular business hours (8:30am-5pm).  Please don't hesitate to call and ask to speak to one of our nurses for clinical concerns.    A surgeon from [facility] is always on call at the hospitals 24 hours/day If you have a medical emergency, go to the nearest emergency room or call 911.  FOLLOW UP in our office One the day of your discharge from the hospital (or the next business weekday), please call [facility] to set up or confirm an appointment to see your surgeon in the office for a follow-up appointment.   Usually it is 2-3 weeks after your surgery.   If you have skin staples at your incision(s), let the office know so we can set up a time in the office for the nurse to remove them (usually around 10 days after surgery). Make sure that you call for appointments the day of discharge (or the next business weekday) from the hospital to ensure a convenient appointment time. IF YOU HAVE DISABILITY OR FAMILY LEAVE FORMS, BRING THEM TO THE OFFICE FOR PROCESSING.  DO NOT GIVE THEM TO YOUR DOCTOR.  [facility] [address] [place], [place], [place]  [address] ? [phone] - Main [phone] - [place],  [phone] - Fax [email or web address]    GETTING TO GOOD BOWEL HEALTH. It is  expected for your digestive tract to need a few months to get back to normal.  It is common for your bowel movements and stools to be irregular.  You will have occasional bloating and cramping that should eventually fade away.  Until you are eating solid food normally, off all pain medications, and back to regular activities; your bowels will not be normal.   Avoiding constipation The goal: ONE SOFT BOWEL MOVEMENT A DAY!    Drink plenty of fluids.  Choose water first. TAKE A FIBER SUPPLEMENT EVERY DAY THE REST OF YOUR LIFE During your first week back home, gradually add back a fiber supplement every day Experiment which form you can tolerate.   There are many forms such as powders, tablets, wafers, gummies, etc Psyllium bran (Metamucil), methylcellulose (Citrucel), Miralax or Glycolax, Benefiber, Flax Seed.  Adjust the dose week-by-week (1/2 dose/day to 6 doses a day) until you are moving your bowels 1-2 times a day.  Cut back the dose or try a different fiber product if it is giving you problems such as diarrhea or bloating. Sometimes a laxative is needed to help jump-start bowels if constipated until the fiber supplement can help regulate your bowels.  If you are tolerating eating  & you are farting, it is okay to try a gentle laxative such as double dose MiraLax, prune juice, or Milk of Magnesia.  Avoid using laxatives too often. Stool softeners can sometimes help counteract the constipating effects of narcotic pain medicines.  It can also cause diarrhea, so avoid using for too [place]. If you are still constipated despite taking fiber daily, eating solids, and a few doses of laxatives, call our office. Controlling diarrhea Try drinking liquids and eating bland foods for a few days to avoid stressing your intestines further. Avoid dairy products (especially milk & ice cream) for a short time.  The intestines often can lose the ability to digest lactose when stressed. Avoid foods that cause gassiness or bloating.  Typical foods include beans and other legumes, cabbage, broccoli, and dairy foods.  Avoid greasy, spicy, fast foods.  Every person has some sensitivity to other foods, so listen to your body and avoid those foods that trigger problems for you. Probiotics (such as active yogurt, Align, etc) may help repopulate the intestines and colon with normal bacteria and calm down a sensitive digestive tract Adding a fiber supplement gradually can help thicken stools by absorbing excess fluid and retrain the intestines to act more normally.  Slowly increase the dose over a few weeks.  Too much fiber too soon can backfire and cause cramping & bloating. It is okay to try and slow down diarrhea with a few doses of antidiarrheal medicines.   Bismuth subsalicylate (ex. Kayopectate, Pepto Bismol) for a few doses can help control diarrhea.  Avoid if pregnant.   Loperamide (Imodium) can slow down diarrhea.  Start with one tablet ('2mg'$ ) first.  Avoid if you are having fevers or severe pain.  ILEOSTOMY PATIENTS WILL HAVE CHRONIC DIARRHEA since their colon is not in use.    Drink plenty of liquids.  You will need to drink even more glasses of water/liquid a day to avoid getting dehydrated. Record  output from your ileostomy.  Expect to empty the bag every 3-4 hours at first.  Most people with a permanent ileostomy empty their bag 4-6 times at the least.   Use antidiarrheal medicine (especially Imodium) several times a day to avoid getting dehydrated.  Start with a dose at bedtime &  breakfast.  Adjust up or down as needed.  Increase antidiarrheal medications as directed to avoid emptying the bag more than 8 times a day (every 3 hours). Work with your wound ostomy nurse to learn care for your ostomy.  See ostomy care instructions. TROUBLESHOOTING IRREGULAR BOWELS 1) Start with a soft & bland diet. No spicy, greasy, or fried foods.  2) Avoid gluten/wheat or dairy products from diet to see if symptoms improve. 3) Miralax 17gm or flax seed mixed in [place]. water or juice-daily. May use 2-4 times a day as needed. 4) Gas-X, Phazyme, etc. as needed for gas & bloating.  5) Prilosec (omeprazole) over-the-counter as needed 6)  Consider probiotics (Align, Activa, etc) to help calm the bowels down  Call your doctor if you are getting worse or not getting better.  Sometimes further testing (cultures, endoscopy, X-ray studies, CT scans, bloodwork, etc.) may be needed to help diagnose and treat the cause of the diarrhea. [facility], [place], [place], [place], [place]  [address] [phone] - Main.    [phone]  - Toll Free.   [phone] - Fax [email or web address]    Pelvic floor muscle training exercises ("Kegels") can help strengthen the muscles under the uterus, bladder, and bowel (large intestine). They can help both men and women who have problems with urine leakage or bowel control.  A pelvic floor muscle training exercise is like pretending that you have to urinate, and then holding it. You relax and tighten the muscles that control urine flow. It's important to find the right muscles to tighten.  The next time you have to urinate, start to go  and then stop. Feel the muscles in your vagina, bladder, or anus get tight and move up. These are the pelvic floor muscles. If you feel them tighten, you've done the exercise right. If you are still not sure whether you are tightening the right muscles, keep in mind that all of the muscles of the pelvic floor relax and contract at the same time. Because these muscles control the bladder, rectum, and vagina, the following tips may help: Women: Insert a finger into your vagina. Tighten the muscles as if you are holding in your urine, then let go. You should feel the muscles tighten and move up and down.  Men: Insert a finger into your rectum. Tighten the muscles as if you are holding in your urine, then let go. You should feel the muscles tighten and move up and down. These are the same muscles you would tighten if you were trying to prevent yourself from passing gas.  It is very important that you keep the following muscles relaxed while doing pelvic floor muscle training exercises: Abdominal  Buttocks (the deeper, anal sphincter muscle should contract)  Thigh   A woman can also strengthen these muscles by using a vaginal cone, which is a weighted device that is inserted into the vagina. Then you try to tighten the pelvic floor muscles to hold the device in place. If you are unsure whether you are doing the pelvic floor muscle training correctly, you can use biofeedback and electrical stimulation to help find the correct muscle group to work. Biofeedback is a method of positive reinforcement. Electrodes are placed on the abdomen and along the anal area. Some therapists place a sensor in the vagina in women or anus in men to monitor the contraction of pelvic floor muscles.  A monitor will display a graph showing which muscles are contracting  and which are at rest. The therapist can help find the right muscles for performing pelvic floor muscle training exercises.   PERFORMING PELVIC FLOOR EXERCISES: 1.  Begin by emptying your bladder. 2. Tighten the pelvic floor muscles and hold for a count of 10. 3. Relax the muscles completely for a count of 10. 4. Do 10 repititions, 3 to 5 times a day (morning, afternoon, and night). You can do these exercises at any time and any place. Most people prefer to do the exercises while lying down or sitting in a chair. After 4 - 6 weeks, most people notice some improvement. It may take as long as 3 months to see a major change. After a couple of weeks, you can also try doing a single pelvic floor contraction at times when you are likely to leak (for example, while getting out of a chair). A word of caution: Some people feel that they can speed up the progress by increasing the number of repetitions and the frequency of exercises. However, over-exercising can instead cause muscle fatigue and increase urine leakage. If you feel any discomfort in your abdomen or back while doing these exercises, you are probably doing them wrong. Breathe deeply and relax your body when you are doing these exercises. Make sure you are not tightening your stomach, thigh, buttock, or chest muscles. When done the right way, pelvic floor muscle exercises have been shown to be very effective at improving urinary continence.  Pelvic Floor Pain / Incontinence  Do you suffer from pelvic pain or incontinence? Do you have pain in the pelvis, low back or hips that is associated with sitting, walking, urination or intercourse? Have you experienced leaking of urine or feces when coughing, sneezing or laughing? Do you have pain in the pelvic area associated with cancer?  These are conditions that are common with pelvic floor muscle dysfunction. Over time, due to stress, scar tissue, surgeries and the natural course of aging, our muscles may become weak or overstressed and can spasm. This can lead to pain, weakness, incontinence or decreased quality of life.  Men and women with pelvic floor dysfunction  frequently describe:  A "falling out" feeling. Pain or burning in the abdomen, tailbone or perineal area. Constipation or bowel elimination problems or difficulty initiating urination. Unresolved low back or hip pain. Frequency and urgency when going to the bathroom. Leaking of urine or feces. Pain with intercourse.  [email or web address]  To make a referral or for more information about [facility] Pelvic [facility] Therapy [facility], call  [facility]) - [place] [phone] [place]) - [place] [facility]) - [phone]

## 2022-06-18 NOTE — Plan of Care (Signed)
  Problem: Bowel/Gastric: Goal: Gastrointestinal status for postoperative course will improve Outcome: Progressing   

## 2022-06-18 NOTE — Anesthesia Procedure Notes (Signed)
Procedure Name: Intubation Date/Time: 06/18/2022 7:36 AM  Performed by: Cleda Daub, CRNAPre-anesthesia Checklist: Patient identified, Emergency Drugs available, Suction available and Patient being monitored Patient Re-evaluated:Patient Re-evaluated prior to induction Oxygen Delivery Method: Circle system utilized Preoxygenation: Pre-oxygenation with 100% oxygen Induction Type: IV induction Ventilation: Mask ventilation without difficulty and Oral airway inserted - appropriate to patient size Laryngoscope Size: Sabra Heck and 2 Grade View: Grade I Tube type: Oral Tube size: 7.0 mm Number of attempts: 1 Airway Equipment and Method: Stylet and Oral airway Placement Confirmation: ETT inserted through vocal cords under direct vision, positive ETCO2 and breath sounds checked- equal and bilateral Secured at: 21 cm Tube secured with: Tape Dental Injury: Teeth and Oropharynx as per pre-operative assessment

## 2022-06-18 NOTE — Consult Note (Addendum)
Initial Consultation Note   Patient: [name] HYQ:[id number] DOB: 05-02-1956 PCP: Waylan Rocher, MD DOA: 06/18/2022 DOS: the patient was seen and examined on 06/18/2022 Primary service: Michael Boston, MD  Referring physician: Dr. Johney Maine Reason for consult: Medical Management  Assessment/Plan: Rectal prolapse s/p rectosigmoid resection     - admitted to surgical service; plan (including diet, pain control) per them  COPD Chronic hypoxic respiratory failure on 3L [place] qHS     - continue home regimen     - continue nocturnal O2 support  HTN     - continue home regimen  Hypothyroidism     - continue home regimen  Chronic HFpEF     - labs are pending, lasix held today; reassess for resuming in AM  Tobacco abuse     - counsel against further use     - nicotine patch  Hypothyroidism      - continue home regimen  GERD     - PPI  Hyperglycemia     - she denies history of DM and she is not on any medications per her account     - A1c from today is 5.2. Will d/c SSI.     - can trend her glucose for right now  [facility] will continue to follow the patient.  HPI: [name] is a 66 y.o. female with past medical history of COPD, chronic hypoxic respiratory failure on 3L qHS, HTN, chronic HFpEF, anxiety/depression. Presenting to surgical service for rectal prolapse repair.She has had difficulty with fecal incontinence for some time now. She was evaluated by surgery. Complete rectal prolapse was suspected so she was referred to Dr. Johney Maine. It was recommended that she a rectosigmoid resection. She agreed to the procedure. It was successfully completed today. [facility] was called for assistance with management of her many co-morbid conditions.   Review of Systems: As mentioned in the history of present illness. All other systems reviewed and are negative. Past Medical History:  Diagnosis Date   (HFpEF) heart failure with preserved ejection fraction (HCC)    Acute renal failure ([place])  03/29/2011   Acute respiratory distress 11/2020   Acute respiratory failure ([place]) 04/14/2014   Anxiety    Arthritis    CHF (congestive heart failure) (HCC)    Chronic respiratory failure with hypoxia and hypercapnia ([place])    Community acquired pneumonia 01/10/2013   COPD (chronic obstructive pulmonary disease) (HCC)    no inhalers--smoker, no oxygen   Depression    Dyspnea    Emphysema lung (HCC)    GERD (gastroesophageal reflux disease)    Hyperlipidemia    Hypertension    Hypothyroid    Peripheral vascular disease ([place])    Pre-diabetes    Restless leg syndrome    Seizures ([place])    02/13/2011 -hx of seizure due to "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  no seizure since--pt does take keppra and office note from neurologist dr. Leta Baptist on this chart   Urinary incontinence    Past Surgical History:  Procedure Laterality Date   CARPAL TUNNEL RELEASE     left   COLONOSCOPY WITH PROPOFOL N/A 06/17/2022   Procedure: COLONOSCOPY WITH PROPOFOL;  Surgeon: Lesly Rubenstein, MD;  Location: [facility] ENDOSCOPY;  Service: Endoscopy;  Laterality: N/A;   JOINT REPLACEMENT  08/26/2011   right total knee arthroplasty, left total knee   KNEE ARTHROPLASTY  10/28/2011   Procedure: COMPUTER ASSISTED TOTAL KNEE ARTHROPLASTY;  Surgeon: Karen Chafe Rendall III;  Location: [facility] OR[facility];  Service: Orthopedics;  Laterality: Left;  preop femoral nerve block   KNEE SURGERY     right   laminectomy and diskectomy     L4-5 with fusion   Social History:  reports that she has been smoking cigarettes. She has a 48.00 pack-year smoking history. She has never used smokeless tobacco. She reports current alcohol use. She reports that she does not use drugs.  Allergies  Allergen Reactions   Hydrocodone Nausea And Vomiting   Nsaids Other (See Comments)    Pt states it messes up her kidneys But takes aspirin & ibuprofen    Buchu-[place]sil[place]-Ch Grass-Hydran Other (See Comments)   Thiazide-Type Diuretics     Other reaction(s): Unknown    Family History  Problem Relation Age of Onset   Heart attack Father 58   Alzheimer's disease Mother    Breast cancer Neg Hx     Prior to Admission medications   Medication Sig Start Date End Date Taking? Authorizing Provider  acidophilus (RISAQUAD) CAPS capsule Take 1 capsule by mouth daily.   Yes [provider]  amLODipine (NORVASC) 5 MG tablet Take 7.5 mg by mouth daily. 07/04/17  Yes [provider]  ARIPiprazole (ABILIFY) 10 MG tablet Take 10 mg by mouth daily.   Yes [provider]  aspirin 81 MG EC tablet Take 81 mg by mouth daily.    Yes [provider]  busPIRone (BUSPAR) 30 MG tablet Take 30 mg by mouth 2 (two) times daily.   Yes [provider]  cetirizine (ZYRTEC) 10 MG tablet Take 10 mg by mouth daily as needed for allergies or rhinitis. 03/03/22  Yes [provider]  escitalopram (LEXAPRO) 20 MG tablet Take 30 mg by mouth daily.   Yes [provider]  famotidine (PEPCID) 40 MG tablet Take 40 mg by mouth at bedtime. 11/12/20  Yes [provider]  fenofibrate micronized (LOFIBRA) 134 MG capsule Take 134 mg by mouth daily before breakfast.    Yes [provider]  fluticasone (FLONASE) 50 MCG/ACT nasal spray Place 1 spray into both nostrils daily as needed for allergies or rhinitis.   Yes [provider]  Fluticasone-Umeclidin-Vilant (TRELEGY ELLIPTA) 100-62.5-25 MCG/ACT AEPB Inhale 1 puff into the lungs daily. 01/29/22  Yes [provider]  furosemide (LASIX) 40 MG tablet Take 1 tablet (40 mg total) by mouth daily. May take additional 40 mg (one tablet) as needed for swelling, weight gain or sob 11/19/20 06/17/22 Yes Dahal, Marlowe Aschoff, MD  gabapentin (NEURONTIN) 800 MG tablet Take 800 mg by mouth 3 (three) times daily.  06/23/17  Yes [provider]  levETIRAcetam (KEPPRA)  500 MG tablet Take 1 tablet (500 mg total) by mouth 2 (two) times daily. Patient taking differently: Take 500 mg by mouth daily. 04/18/14  Yes Kelvin Cellar, MD  levothyroxine (SYNTHROID, LEVOTHROID) 25 MCG tablet Take 25 mcg by mouth daily before breakfast.  06/09/17  Yes [provider]  loperamide (IMODIUM) 2 MG capsule Take 12 mg by mouth as needed for diarrhea or loose stools.   Yes [provider]  losartan (COZAAR) 100 MG tablet Take 100 mg by mouth daily.  07/20/17  Yes [provider]  Menthol, Topical Analgesic, (BIOFREEZE) 10 % CREA Apply 1 Application topically daily as needed (pain).   Yes [provider]  Multiple Vitamins-Minerals (CENTRUM SILVER 50+WOMEN) TABS Take 1 tablet by  mouth daily.   Yes [provider]  OXYGEN Inhale 3 L into the lungs at bedtime.   Yes [provider]  pantoprazole (PROTONIX) 40 MG tablet Take 40 mg by mouth every morning.  08/16/13  Yes [provider]  pramipexole (MIRAPEX) 0.5 MG tablet Take 0.5 mg by mouth at bedtime. 04/16/22  Yes [provider]  traMADol (ULTRAM) 50 MG tablet Take 1-2 tablets (50-100 mg total) by mouth every 6 (six) hours as needed for moderate pain or severe pain. 06/18/22  Yes Michael Boston, MD  Vibegron (GEMTESA) 75 MG TABS Take 75 mg by mouth daily. 05/27/22  Yes Stoioff, Ronda Fairly, MD  albuterol (VENTOLIN HFA) 108 (90 Base) MCG/ACT inhaler Inhale 2 puffs into the lungs every 4 (four) hours as needed for wheezing or shortness of breath. 01/16/20   Julian Hy, DO  umeclidinium-vilanterol (ANORO ELLIPTA) 62.5-25 MCG/INH AEPB Inhale 1 puff into the lungs daily. Patient not taking: Reported on 06/01/2022 01/16/20   Julian Hy, DO    Physical Exam: Vitals:   06/18/22 0610 06/18/22 1123 06/18/22 1130 06/18/22 1145  BP:  131/74 137/83 (!) 152/72  Pulse:  69 69 69  Resp:  '17 15 17  '$ Temp:  97.7 F (36.5 C)    TempSrc:      SpO2:  99% 96% 98%  Weight: 80.7 kg      Height: '5\' 6"'$  (1.676 m)      General: 66 y.o. female resting in bed in NAD Eyes: PERRL, normal sclera ENMT: Nares patent w/o discharge, orophaynx clear, dentition normal, ears w/o discharge/lesions/ulcers Neck: Supple, trachea midline Cardiovascular: RRR, +S1, S2, no m/g/r, equal pulses throughout Respiratory: CTABL, no w/r/r, normal WOB GI: BS+, NDNT, no masses noted, no organomegaly noted, drain noted MSK: No e/c/c Neuro: A&O x 3, no focal deficits, she is a little groggy Psyc: Appropriate interaction and affect, calm/cooperative  Data Reviewed:   There are no new results to review at this time.     Family Communication: None at bedside Primary team communication: w/ Dr. Johney Maine Thank you very much for involving [place] in the care of your patient.  Author: Jonnie Finner, DO 06/18/2022 12:12 PM  For on call review [email or web address].

## 2022-06-18 NOTE — Progress Notes (Signed)
Patient too lethargic from anaesthesia to ambulate at this time.

## 2022-06-18 NOTE — H&P (Signed)
06/18/2022   REFERRING PHYSICIAN: [place] [place]  Patient Care Team: Pernell Dupre, MD as PCP - General (Internal Medicine) Beryle Beams, MD (Gastroenterology) Minus Breeding, MD (Cardiovascular Disease) Chesley Mires, MD (Pulmonary Disease) Gillis Santa, MD (Pain Medicine) Haig Prophet, Leveda Anna, MD as Consulting Provider (Gastroenterology) Richardson Dopp, MD (Neurology) Marty Heck, MD (Vascular Surgery) Rinaldo Ratel, MD (Urogynecology) Erby Pian, MD (Pulmonary Disease) Flossie Dibble, MD as Consulting Provider (Cardiovascular Disease)  PROVIDER: Hollace Kinnier, MD  [facility] MRN: [id number] DOB: 05-27-1956  SUBJECTIVE   Chief Complaint: Rectal Prolapse   History of Present Illness: [name] is a 66 y.o. female who is seen today  as an office consultation at the request of Dr. Dahlia Byes for evaluation of Rectal Prolapse  66 year old woman with numerous medical issues. Heart failure with preserved ejection fraction and COPD often oxygen dependent. Followed by Dr. Halford Chessman and [facility]. In [place] at [facility]. Overdue for PFT studies. Most recently saw Dr. Raul Del also history of coronary disease seen by [facility] Dr. Percival Spanish in the past with cardiology. Myoview stress test in 2019 underwhelming. Echocardiogram perfusion good with normal ejection fraction 60-65%. No work-up since as far as I can tell. She notes she is trying to really establish with cardiology in [place]. Dr. Nehemiah Massed. Chronic pain issues with a lot of back issues. Heartburn reflux. Urinary incontinence seen by Dr. Zigmund Daniel in [facility] uro[facility]necology. Sounds like a stress urinary component. Wears diapers. Distant history of seizures. Smoker. Apparently has had worsening episodes of fecal incontinence over the past years and now has had frank prolapse. Saw Dr. Dahlia Byes with general surgery in [place]. He suspected complete rectal prolapse and recommended referral  to see someone with colorectal expertise. Patient is due to get a colonoscopy by Dr. Haig Prophet with gastrology in [place].  Patient comes today with her fianc. Her husband died a few years ago from heart attack. Both are smokers. Patient notes that she moves her bowels about twice a week. Cannot control when her bowels move but often has to strain with some discomfort. No major bleeding. Wears diapers all the times. She thinks she has had some incontinent issues for the past decade. She never had an episiotomy. No anorectal surgery. She gets rather short of breath but does not need a cane or a walker. She often uses oxygen at night but not during the day. Some question of diabetes diagnosis but she denies this. She is not on any CPAP. She denies any prior abdominal surgery. She recalls an underwhelming colonoscopy about 5 years ago. She thinks it was Dr. Benson Norway  Medical History:  Past Medical History:  Diagnosis Date   Allergy   Anxiety   Arthritis   COPD (chronic obstructive pulmonary disease) (CMS-HCC)   Hypertension   Incontinence   Thyroid disease   Patient Active Problem List  Diagnosis   Acute kidney failure, unspecified (CMS-HCC)   Acquired hypothyroidism   Anxiety and depression   CHF exacerbation (CMS-HCC)   Chronic pain syndrome   Community acquired pneumonia   Acute on chronic respiratory failure with hypoxia (CMS-HCC)   Diabetes mellitus, type 2 (CMS-HCC)   Chronic diastolic CHF (congestive heart failure) (CMS-HCC)   Drug overdose   Essential hypertension   Lumbar degenerative disc disease   Sepsis (CMS-HCC)   Tobacco abuse   (HFpEF) heart failure with preserved ejection fraction (CMS-HCC)   Failed back surgical syndrome   GERD (gastroesophageal reflux disease)   Myofascial pain syndrome   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 (CMS-HCC)   Restless leg syndrome   SOB (shortness of breath)   UTI (urinary tract infection)   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 Michela Pitcher do not take due to kidneys Dr. Michela Pitcher do not take due to kidneys   Nsaids (Non-Steroidal Anti-Inflammatory Drug) Other (See Comments) and Unknown  Pt states it messes up her kidneys  Pt states it messes up her kidneys   Hydrocodone-Acetaminophen Nausea And Vomiting, Other (See Comments) and Unknown   [place]-Hydran Other (See Comments)   Current Outpatient Medications on File Prior to Visit  Medication Sig Dispense Refill   acetaminophen (TYLENOL) 325 MG tablet Take by mouth every 4 (four) hours as needed   amLODIPine (NORVASC) 5 MG tablet   ARIPiprazole (ABILIFY) 10 MG tablet   buprenorphine-naloxone (SUBOXONE) 8-2 mg SL film Place under the tongue   busPIRone (BUSPAR) 30 MG tablet Take 30 mg by mouth 2 (two) times daily   cetirizine (ZYRTEC) 10 MG tablet Take 10 mg by mouth once daily   escitalopram oxalate (LEXAPRO) 20 MG tablet   famotidine (PEPCID) 40 MG tablet   fenofibrate micronized (LOFIBRA) 134 MG capsule TAKE 1 CAPSULE BY MOUTH DAILY WITH A MEAL   FUROsemide (LASIX) 40 MG tablet   gabapentin (NEURONTIN) 800 MG tablet Take by mouth   hydrOXYzine (ATARAX) 25 MG tablet Take by mouth at bedtime as needed   levETIRAcetam (KEPPRA) 500 MG tablet Take by mouth   levothyroxine (SYNTHROID) 25 MCG tablet   losartan (COZAAR) 100 MG tablet Take 100 mg by mouth once daily   MATZIM LA 240 mg 24 hr tablet Take 240 mg by mouth once daily   nortriptyline  (PAMELOR) 75 MG capsule Take 75 mg by mouth at bedtime   pantoprazole (PROTONIX) 40 MG DR tablet   potassium chloride (KLOR-CON) 10 MEQ ER tablet Take 10 mEq by mouth once daily   pramipexole (MIRAPEX) 0.5 MG tablet Take 0.5 mg by mouth at bedtime   predniSONE (DELTASONE) 20 MG tablet Take 1 tablet (20 mg total) by mouth once daily 7 tablet 0   SYMBICORT 160-4.5 mcg/actuation inhaler   topiramate (TOPAMAX) 100 MG tablet Take 100 mg by mouth 2 (two) times daily   TRELEGY ELLIPTA 100-62.5-25 mcg inhaler USE AS DIRECTED 1 INHALATION DAILY   VYVANSE 30 mg capsule Take 30 mg by mouth once daily   No current facility-administered medications on file prior to visit.   Family History  Problem Relation Age of Onset   Skin cancer Mother   Coronary Artery Disease (Blocked arteries around heart) Father   Hyperlipidemia (Elevated cholesterol) Sister   High blood pressure (Hypertension) Sister   Diabetes Sister    Social History   Tobacco Use  Smoking Status Every Day   Packs/day: 1.00   Types: Cigarettes  Smokeless Tobacco Never    Social History   Socioeconomic History   Marital status: Married  Tobacco Use   Smoking  status: Every Day  Packs/day: 1.00  Types: Cigarettes   Smokeless tobacco: Never  Vaping Use   Vaping Use: Never used  Substance and Sexual Activity   Alcohol use: Never   Drug use: Never   Sexual activity: Defer   ############################################################  Review of Systems: A complete review of systems (ROS) was obtained from the patient. I have reviewed this information and discussed as appropriate with the patient. See HPI as well for other pertinent ROS.  Constitutional: No fevers, chills, sweats. Weight stable Eyes: No vision changes, No discharge HENT: No sore throats, nasal drainage Lymph: No neck swelling, No bruising easily Pulmonary: No cough, productive sputum CV: No orthopnea, PND . No exertional chest/neck/shoulder/arm pain.  Patient can walk 1 block but needs assistance.   GI: No personal nor family history of GI/colon cancer, inflammatory bowel disease, irritable bowel syndrome, allergy such as Celiac Sprue, dietary/dairy problems, colitis, ulcers nor gastritis. No recent sick contacts/gastroenteritis. No travel outside the country. No changes in diet.  Renal: No UTIs, No hematuria Genital: No drainage, bleeding, masses Musculoskeletal: No severe joint pain. Good ROM major joints Skin: Edema bilateral lower extremity especially left greater than right most likely consistent with some venous congestion. No sores or lesions Heme/Lymph: No easy bleeding. No swollen lymph nodes Neuro: No active seizures. No facial droop Psych: No hallucinations. No agitation  OBJECTIVE   Vitals:  04/06/22 0914  Pulse: 86  Temp: 37 C (98.6 F)  SpO2: 91%  Weight: 87.4 kg (192 lb 9.6 oz)  Height: 167.6 cm ('5\' 6"'$ )   Body mass index is 31.09 kg/m.  PHYSICAL EXAM:  Constitutional: Not cachectic. Hygeine adequate. Vitals signs as above. Some air of tobacco/smoke. Eyes: Pupils reactive, normal extraocular movements. Sclera nonicteric Neuro: CN II-XII intact. No major focal sensory defects. No major motor deficits. Lymph: No head/neck/groin lymphadenopathy Psych: No severe agitation. No severe anxiety. Judgment & insight Adequate, Oriented x4, HENT: Deep gravelly voice consistent with chronic smoking. Normocephalic, Mucus membranes moist. No thrush. Hearing: adequate Neck: Supple, No tracheal deviation. No obvious thyromegaly Chest: No pain to chest wall compression. Good respiratory excursion. Wheezing on inhalation. No definite rales or rhonchi.  CV: Pulses intact. regular. No major extremity edema Ext: No obvious deformity or contracture. Edema: Moderate on bilateral lower extremities with some venous skin thinning. No active ulcers right now.. No cyanosis Skin: No major subcutaneous nodules. Warm and dry Musculoskeletal:  Severe joint rigidity not present. No obvious clubbing. No digital petechiae. Mobility: no assist device moves but needs assisting  Abdomen: Obese with panniculus Soft. Nondistended. Nontender. Hernia: Not present. Diastasis recti: Not present. No hepatomegaly. No splenomegaly.  Genital/Pelvic: Inguinal hernia: Not present. Inguinal lymph nodes: without lymphadenopathy nor hidradenitis. No external female genitalia without any vaginal bleeding or discharge. No inflamed cyst. Mild stress urine incontinence with straining.  Rectal:   ##################################  Perianal skin Mild fecal soiling  Pruritis ani: Not present Pilonidal disease: Not present Condyloma / warts: Not present  Anal fissure: Not present Perirectal abscess/fistula Not present External hemorrhoids Not present  Digital and anoscopic rectal exam tolerated  Sphincter tone Initially very little at home but eventually is able to have a weak squeeze. I palpate no obvious sphincter defect. Overall very poor resting tone  Hemorrhoidal piles Grade 1 normal Prostate: N/A Rectovaginal septum: Thin with mild rectocele Rectal masses: Small fibrotic 5 mm mass felt at tip of finger around 8 cm left anterior. Suspicious for hyperplastic polyp.  Other significant findings: Very redundant rectum  that prolapses easily with straining. 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  ###################################    ###################################################################  Labs, Imaging and Diagnostic Testing:  Located in 'Care Everywhere' section of Epic EMR chart  PRIOR [facility] CLINIC NOTES:  Not applicable  SURGERY NOTES:  Not applicable  PATHOLOGY:  Not applicable  Assessment and Plan:  DIAGNOSES:  Diagnoses and all orders for this visit:  Rectal prolapse - Ambulatory Referral to Physical Therapy  Chronic diastolic CHF  (congestive heart failure) (CMS-HCC) - Ambulatory referral to Smoking Cessation Program  Pulmonary emphysema, unspecified emphysema type (CMS-HCC) - Ambulatory referral to Smoking Cessation Program  SOB (shortness of breath) - Ambulatory referral to Smoking Cessation Program  Urinary and fecal incontinence - Ambulatory Referral to Physical Therapy  Chronic pain syndrome  Rectal polyp  Decreased anal sphincter tone - Ambulatory Referral to Physical Therapy  Constipation, chronic  BMI 30.0-30.9,adult  Tobacco abuse disorder - Ambulatory referral to Smoking Cessation Program    ASSESSMENT/PLAN  Patient with rectal prolapse in the setting of chronic constipation now with incontinence and decreased sphincter tone but intact.  Standard of care is to consider robotic low anterior rectosigmoid resection and rectopexy to help correct the anatomy and suture back up. Certainly technically feasible in obese woman without any prior abdominal surgery.   The anatomy & physiology of the digestive tract was discussed. The pathophysiology of rectal prolapse was discussed. Natural history risks without surgery was discussed. I feel the risks of no intervention will lead to serious problems that outweigh the operative risks; therefore, I recommended surgery to treat the pathology. Possible need for sigmoid colectomy to remove redundant colon was discussed. Pexy by suture and probable mesh reinforcement was discussed as well. Laparoscopic & open techniques were discussed.   Risks such as bleeding, infection, abscess, leak, reoperation, possible ostomy, hernia, stroke, heart attack, death, and other risks were discussed. I noted a good likelihood this will help address the problem. Goals of post-operative recovery were discussed as well. We will work to minimize complications. An educational handout on the technique was given as well. Questions were answered. The patient expresses understanding &  wishes to proceed with surgery.  However her risks are markedly increased given her history of COPD with persistent smoking. Question of heart failure as well.  I would need clearance from pulmonology. She is overdue to get pulmonary function test or Dr. Raul Del. Need to assess if she can tolerate general anesthesia.  We will try and send a referral for the smoking cessation program as well. I strongly recommend she quit smoking as her risks of complications of surgery and failure of repair are markedly increased while she continues to smoke.  I think she needs cardiac clearance to assess her risks. She has had prior Myoview's and echocardiograms that were relatively underwhelming in the past 5 years but her performance status is poor. She claims she is due to establish with Dr. Nehemiah Massed this coming week since she is closer to the [place] region. See what he thinks.  Patient due to get colonoscopy in [place]. Apparently had one about 5 years ago by Dr. Benson Norway in [place]. She does not recall any major issues. Would like to get a copy of that report. Strongly agree with getting a colonoscopy now to ensure there are no lead points or other concerns.  She would benefit from pelvic floor physical therapy referral to help with improving sphincter tone strengthening. Usually it  recovers once the prolapse is corrected and I do not sense any major sphincter defects at this time. I am guarded that defecography or anal manometry will markedly change the plan. She would like to avoid a colostomy so reasonable to try and do rectopexy instead if she can tolerate general anesthesia.  Once we get information and work-up going, try and plan surgery if it is safe. I caution she is not low risk. They wish to be aggressive and do something to help turn things around. We will see.    Adin Hector, MD, FACS, MASCRS Esophageal, Gastrointestinal & Colorectal Surgery Robotic and Minimally Invasive  Surgery  [facility] [address], [place], [place] [address] [phone] Fax [phone] Main  CONTACT INFORMATION:  Weekday (9AM-5PM): Call [facility] main office at [phone]  Weeknight (5PM-9AM) or Weekend/Holiday: Check [email or web address] (password " [email or web address]") for General Surgery [facility] coverage  (Please, do not use SecureChat as it is not reliable communication to operating surgeons for immediate patient care)    06/18/2022

## 2022-06-19 ENCOUNTER — Encounter (HOSPITAL_COMMUNITY): Payer: Self-pay | Admitting: Surgery

## 2022-06-19 DIAGNOSIS — G894 Chronic pain syndrome: Secondary | ICD-10-CM

## 2022-06-19 DIAGNOSIS — J441 Chronic obstructive pulmonary disease with (acute) exacerbation: Secondary | ICD-10-CM

## 2022-06-19 DIAGNOSIS — I5032 Chronic diastolic (congestive) heart failure: Secondary | ICD-10-CM

## 2022-06-19 DIAGNOSIS — E039 Hypothyroidism, unspecified: Secondary | ICD-10-CM

## 2022-06-19 DIAGNOSIS — N179 Acute kidney failure, unspecified: Secondary | ICD-10-CM

## 2022-06-19 DIAGNOSIS — F419 Anxiety disorder, unspecified: Secondary | ICD-10-CM

## 2022-06-19 DIAGNOSIS — Z720 Tobacco use: Secondary | ICD-10-CM

## 2022-06-19 DIAGNOSIS — F32A Depression, unspecified: Secondary | ICD-10-CM

## 2022-06-19 DIAGNOSIS — M5136 Other intervertebral disc degeneration, lumbar region: Secondary | ICD-10-CM

## 2022-06-19 DIAGNOSIS — I10 Essential (primary) hypertension: Secondary | ICD-10-CM

## 2022-06-19 DIAGNOSIS — K30 Functional dyspepsia: Secondary | ICD-10-CM

## 2022-06-19 DIAGNOSIS — J9611 Chronic respiratory failure with hypoxia: Secondary | ICD-10-CM

## 2022-06-19 DIAGNOSIS — D649 Anemia, unspecified: Secondary | ICD-10-CM

## 2022-06-19 LAB — BASIC METABOLIC PANEL
Anion gap: 6 (ref 5–15)
BUN: 15 mg/dL (ref 8–23)
CO2: 30 mmol/L (ref 22–32)
Calcium: 8.8 mg/dL — ABNORMAL LOW (ref 8.9–10.3)
Chloride: 99 mmol/L (ref 98–111)
Creatinine, Ser: 1.08 mg/dL — ABNORMAL HIGH (ref 0.44–1.00)
GFR, Estimated: 57 mL/min — ABNORMAL LOW (ref >60)
Glucose, Bld: 82 mg/dL (ref 70–99)
Potassium: 3.5 mmol/L (ref 3.5–5.1)
Sodium: 135 mmol/L (ref 135–145)

## 2022-06-19 LAB — CBC
HCT: 34.7 % — ABNORMAL LOW (ref 36.0–46.0)
Hemoglobin: 11.1 g/dL — ABNORMAL LOW (ref 12.0–15.0)
MCH: 32.3 pg (ref 26.0–34.0)
MCHC: 32 g/dL (ref 30.0–36.0)
MCV: 100.9 fL — ABNORMAL HIGH (ref 80.0–100.0)
Platelets: 199 10*3/uL (ref 150–400)
RBC: 3.44 MIL/uL — ABNORMAL LOW (ref 3.87–5.11)
RDW: 14.2 % (ref 11.5–15.5)
WBC: 11.9 10*3/uL — ABNORMAL HIGH (ref 4.0–10.5)
nRBC: 0 % (ref 0.0–0.2)

## 2022-06-19 LAB — MAGNESIUM: Magnesium: 1.9 mg/dL (ref 1.7–2.4)

## 2022-06-19 MED ORDER — LOSARTAN POTASSIUM 50 MG PO TABS
50.0000 mg | ORAL_TABLET | Freq: Every day | ORAL | Status: DC
  Administered 2022-06-20: 50 mg via ORAL
  Filled 2022-06-19: qty 1

## 2022-06-19 MED ORDER — FUROSEMIDE 40 MG PO TABS
40.0000 mg | ORAL_TABLET | Freq: Every day | ORAL | Status: DC
Start: 2022-06-19 — End: 2022-06-20
  Administered 2022-06-19 – 2022-06-20 (×2): 40 mg via ORAL
  Filled 2022-06-19 (×2): qty 1

## 2022-06-19 NOTE — Evaluation (Signed)
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  Pt with hx of CHF, chronic respiratory failure (on home O2 at night), COPD, PVD, L4-5 fusion and bil TKR  Clinical Impression  Pt admitted as above and presenting with function mobility limitations 2* post op pain and ambulatory balance deficits.  This date, pt up to ambulate in halls with noted improvement in stability and pain control with use of RW.  Pt would benefit from RW for home use.     Recommendations for follow up therapy are one component of a multi-disciplinary discharge planning process, led by the attending physician.  Recommendations may be updated based on patient status, additional functional criteria and insurance authorization.  Follow Up Recommendations No PT follow up      Assistance Recommended at Discharge Intermittent Supervision/Assistance  Patient can return home with the following  A little help with bathing/dressing/bathroom;Assistance with cooking/housework;Assist for transportation;Help with stairs or ramp for entrance    Equipment Recommendations Rolling walker (2 wheels)  Recommendations for Other Services       Functional Status Assessment Patient has had a recent decline in their functional status and demonstrates the ability to make significant improvements in function in a reasonable and predictable amount of time.     Precautions / Restrictions Precautions Precautions: Fall Precaution Comments: abdominal JP drain on R Restrictions Weight Bearing Restrictions: No      Mobility  Bed Mobility               General bed mobility comments: Pt seated at EOB and returned to same    Transfers Overall transfer level: Needs assistance Equipment used: None Transfers:  Sit to/from Stand Sit to Stand: Supervision           General transfer comment: for safety only    Ambulation/Gait Ambulation/Gait assistance: Min assist, Min guard Gait Distance (Feet): 380 Feet Assistive device: Rolling walker (2 wheels), 1 person hand held assist Gait Pattern/deviations: Step-through pattern, Decreased step length - right, Decreased step length - left, Shuffle, Antalgic, Trunk flexed       General Gait Details: Pt ambulating initially sans assistive device but with noted instability and reaching for rails to steady.  RW introduced with marked improvement in stability  Stairs            Wheelchair Mobility    Modified Rankin (Stroke Patients Only)       Balance Overall balance assessment: Needs assistance Sitting-balance support: Feet supported, No upper extremity supported Sitting balance-Leahy Scale: Good     Standing balance support: No upper extremity supported Standing balance-Leahy Scale: Fair                               Pertinent Vitals/Pain Pain Assessment Pain Assessment: 0-10 Pain Score: 5  Pain Location: abdominal pain "when I move the wrong way"    Home Living Family/patient expects to be discharged to:: Private residence Living Arrangements: Spouse/significant other Available Help at Discharge: Family;Available 24 hours/day Type of Home: Mobile home Home Access: Ramped entrance       Home Layout: One level Home Equipment: Cane - single point      Prior Function Prior Level of Function : Independent/Modified Independent  Mobility Comments: use of cane as needed       Hand Dominance   Dominant Hand: Right    Extremity/Trunk Assessment   Upper Extremity Assessment Upper Extremity Assessment: Defer to OT evaluation    Lower Extremity Assessment Lower Extremity Assessment: Overall WFL for tasks assessed (Pt reports leg length discrepancy which does cause her to limp)        Communication   Communication: No difficulties  Cognition Arousal/Alertness: Awake/alert Behavior During Therapy: WFL for tasks assessed/performed Overall Cognitive Status: Within Functional Limits for tasks assessed                                          General Comments      Exercises     Assessment/Plan    PT Assessment Patient needs continued PT services  PT Problem List Decreased activity tolerance;Decreased balance;Decreased mobility;Decreased knowledge of use of DME       PT Treatment Interventions DME instruction;Gait training;Functional mobility training;Therapeutic activities;Therapeutic exercise;Patient/family education    PT Goals (Current goals can be found in the Care Plan section)  Acute Rehab PT Goals Patient Stated Goal: Regain IND PT Goal Formulation: With patient Time For Goal Achievement: 07/03/22 Potential to Achieve Goals: Good    Frequency Min 3X/week     Co-evaluation               AM-PAC PT "6 Clicks" Mobility  Outcome Measure Help needed turning from your back to your side while in a flat bed without using bedrails?: None Help needed moving from lying on your back to sitting on the side of a flat bed without using bedrails?: None Help needed moving to and from a bed to a chair (including a wheelchair)?: A Little Help needed standing up from a chair using your arms (e.g., wheelchair or bedside chair)?: A Little Help needed to walk in hospital room?: A Little Help needed climbing 3-5 steps with a railing? : A Lot 6 Click Score: 19    End of Session   Activity Tolerance: Patient tolerated treatment well Patient left: Other (comment) (sitting EOB) Nurse Communication: Mobility status PT Visit Diagnosis: Unsteadiness on feet (R26.81);Difficulty in walking, not elsewhere classified (R26.2)    Time: 1350-1407 PT Time Calculation (min) (ACUTE ONLY): 17 min   Charges:   PT Evaluation $PT Eval Low Complexity: 1  Low          [facility] Pager [phone] Office [phone]   Siona Coulston 06/19/2022, 4:03 PM

## 2022-06-19 NOTE — TOC Initial Note (Signed)
Transition of Care [facility]) - Initial/Assessment Note    Patient Details  Name: [name] MRN: [id number] Date of Birth: 01-03-1956  Transition of Care [facility]) CM/SW Contact:    Leeroy Cha, RN Phone Number: 06/19/2022, 8:54 AM  Clinical Narrative:                  Transition of Care [facility]) Screening Note   Patient Details  Name: [name] Date of Birth: [birth year]   Transition of Care [facility]) CM/SW Contact:    Leeroy Cha, RN Phone Number: 06/19/2022, 8:54 AM    Transition of Care Department [facility]) has reviewed patient and no TOC needs have been identified at this time. We will continue to monitor patient advancement through interdisciplinary progression rounds. If new patient transition needs arise, please place a TOC consult.    Expected Discharge Plan: Home/Self Care Barriers to Discharge: Continued Medical Work up   Patient Goals and CMS Choice Patient states their goals for this hospitalization and ongoing recovery are:: to go home CMS Medicare.gov Compare Post Acute Care list provided to:: Patient Choice offered to / list presented to : Patient  Expected Discharge Plan and Services Expected Discharge Plan: Home/Self Care   Discharge Planning Services: CM Consult   Living arrangements for the past 2 months: Single Family Home                                      Prior Living Arrangements/Services Living arrangements for the past 2 months: Single Family Home Lives with:: Self Patient language and need for interpreter reviewed:: Yes Do you feel safe going back to the place where you live?: Yes            Criminal Activity/Legal Involvement Pertinent to Current Situation/Hospitalization: No - Comment as needed  Activities of Daily Living Home Assistive Devices/Equipment: None ADL Screening (condition at time of admission) Patient's cognitive ability adequate to safely complete daily activities?: Yes Is the patient deaf or have  difficulty hearing?: No Does the patient have difficulty seeing, even when wearing glasses/contacts?: No Does the patient have difficulty concentrating, remembering, or making decisions?: No Patient able to express need for assistance with ADLs?: Yes Does the patient have difficulty dressing or bathing?: No Independently performs ADLs?: Yes (appropriate for developmental age) Does the patient have difficulty walking or climbing stairs?: No Weakness of Legs: None Weakness of Arms/Hands: None  Permission Sought/Granted                  Emotional Assessment Appearance:: Appears stated age     Orientation: : Oriented to Place, Oriented to Self, Oriented to Situation, Oriented to  Time Alcohol / Substance Use: Not Applicable Psych Involvement: No (comment)  Admission diagnosis:  Complete rectal prolapse [K62.3] Patient Active Problem List   Diagnosis Date Noted   Complete rectal prolapse 06/18/2022   Chronic respiratory failure with hypoxia ([place]) 06/18/2022   (HFpEF) heart failure with preserved ejection fraction ([place]) 04/06/2022   GERD (gastroesophageal reflux disease) 04/06/2022   Restless leg syndrome 04/06/2022   Seizures ([place]) 04/06/2022   Vomiting without nausea 04/06/2022   CKD (chronic kidney disease) stage 3, GFR 30-59 ml/min ([place]) 04/06/2022   Delayed gastric emptying 04/06/2022   Anxiety and depression 01/25/2021   Acute on chronic respiratory failure with hypoxia ([place]) 11/17/2020   Protein calorie malnutrition ([place]) 11/17/2020   Hypothyroidism 11/17/2020  Leg pain 02/05/2020   CHF exacerbation ([place]) 12/28/2019   Elevated troponin 12/28/2019   Myofascial pain syndrome 08/23/2018   Lumbar spondylosis 08/23/2018   Leg swelling 07/31/2018   SOB (shortness of breath) 07/31/2018   Tobacco abuse 07/31/2018   Fusion of lumbar spine (L3-S1) 07/20/2018   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) 04/28/2013   Weakness generalized 04/28/2013   Sepsis ([place]) 04/28/2013   Chronic diastolic CHF (congestive heart failure) ([place]) 04/28/2013   Hypokalemia 01/11/2013   Sinus tachycardia 01/11/2013   Acute respiratory failure with hypoxia ([place]) 01/10/2013   COPD exacerbation ([place]) 01/10/2013   Diabetes mellitus, type 2 ([place]) 84/85/9276   Diastolic CHF, acute on chronic ([place]) 01/10/2013   Osteoarthritis of left knee 10/27/2011   Hypoxemia 03/29/2011   COPD (chronic obstructive pulmonary disease) ([place]) 03/29/2011   Hypertension 03/29/2011   Hypotension 03/25/2011   PCP:  Waylan Rocher, MD Pharmacy:   [place] [address] [address] Phone: [phone] Fax: [phone]  [id number] [place] [id number]: [phone]: [phone]    Social Determinants of Health (SDOH) Interventions    Readmission Risk Interventions     No data to display

## 2022-06-19 NOTE — Evaluation (Signed)
Occupational Therapy Evaluation Patient Details Name: [name] MRN: [id number] DOB: [birth year] Today's Date: 06/19/2022   History of Present Illness Pt admitted with rectal prolapse and now s/p low anterior rectosigmoid resection with excion of L adrenal mass and uterine mass.  Pt with hx of CHF, chronic respiratory failure (on home O2 at night), COPD, PVD, L4-5 fusion and bil TKR   Clinical Impression   Patient evaluated by Occupational Therapy with no further acute OT needs identified. All education has been completed and the patient has no further questions. Patient is at baseline for ADLs with patient reporting leg length discrepancy making her off balance at baseline. Patient reported plan to see foot MD for corrective shoe.  See below for any follow-up Occupational Therapy or equipment needs. OT is signing off. Thank you for this referral.       Recommendations for follow up therapy are one component of a multi-disciplinary discharge planning process, led by the attending physician.  Recommendations may be updated based on patient status, additional functional criteria and insurance authorization.   Follow Up Recommendations  No OT follow up    Assistance Recommended at Discharge Frequent or constant Supervision/Assistance  Patient can return home with the following A little help with walking and/or transfers;A little help with bathing/dressing/bathroom;Assistance with cooking/housework;Assist for transportation;Help with stairs or ramp for entrance    Functional Status Assessment  Patient has had a recent decline in their functional status and demonstrates the ability to make significant improvements in function in a reasonable and predictable amount of time.  Equipment Recommendations  Other (comment) (RW)    Recommendations for Other Services       Precautions / Restrictions Precautions Precautions: Fall Precaution Comments: abdominal JP drain on R Restrictions Weight  Bearing Restrictions: No      Mobility Bed Mobility               General bed mobility comments: Pt seated at EOB and returned to same    Transfers Overall transfer level: Needs assistance Equipment used: None Transfers: Sit to/from Stand Sit to Stand: Supervision           General transfer comment: for safety only      Balance Overall balance assessment: Needs assistance Sitting-balance support: Feet supported, No upper extremity supported Sitting balance-Leahy Scale: Good     Standing balance support: No upper extremity supported Standing balance-Leahy Scale: Fair                             ADL either performed or assessed with clinical judgement   ADL Overall ADL's : Modified independent                                       General ADL Comments: patient is able to complete toileting tasks, LB dressing sitting EOB and was eucated on ECT to transition home. patient and fiance state that patient is at baseline for ADLs at this time. patient was educated on figure four position for legs for LB dressing tasks and to use reacher to collect things out of reach. patient was also educated on limting bending lifting and twisting. patient verbalized understanding.     Vision Patient Visual Report: No change from baseline       Perception     Praxis      Pertinent Vitals/Pain Pain  Assessment Pain Assessment: 0-10 Pain Score: 5  Pain Location: abdominal pain "when I move the wrong way" Pain Descriptors / Indicators: Discomfort Pain Intervention(s): Monitored during session     Hand Dominance Right   Extremity/Trunk Assessment Upper Extremity Assessment Upper Extremity Assessment: Overall WFL for tasks assessed   Lower Extremity Assessment Lower Extremity Assessment: Defer to PT evaluation   Cervical / Trunk Assessment Cervical / Trunk Assessment: Normal   Communication Communication Communication: No difficulties    Cognition Arousal/Alertness: Awake/alert Behavior During Therapy: WFL for tasks assessed/performed Overall Cognitive Status: Within Functional Limits for tasks assessed                                 General Comments: fiance was in room as well.     General Comments       Exercises     Shoulder Instructions      Home Living Family/patient expects to be discharged to:: Private residence Living Arrangements: Spouse/significant other Available Help at Discharge: Family;Available 24 hours/day Type of Home: Mobile home Home Access: Ramped entrance     Home Layout: One level               Home Equipment: Cane - single point          Prior Functioning/Environment Prior Level of Function : Independent/Modified Independent             Mobility Comments: use of cane as needed          OT Problem List:        OT Treatment/Interventions:      OT Goals(Current goals can be found in the care plan section) Acute Rehab OT Goals OT Goal Formulation: All assessment and education complete, DC therapy  OT Frequency:      Co-evaluation              AM-PAC OT "6 Clicks" Daily Activity     Outcome Measure Help from another person eating meals?: None Help from another person taking care of personal grooming?: None Help from another person toileting, which includes using toliet, bedpan, or urinal?: A Little (S with no AD) Help from another person bathing (including washing, rinsing, drying)?: None Help from another person to put on and taking off regular upper body clothing?: None Help from another person to put on and taking off regular lower body clothing?: None 6 Click Score: 23   End of Session Nurse Communication: Mobility status  Activity Tolerance: Patient tolerated treatment well Patient left: in bed;with call bell/phone within reach;with family/visitor present;with nursing/sitter in room  OT Visit Diagnosis: Unsteadiness on feet  (R26.81)                Time: 5409-8119 OT Time Calculation (min): 14 min Charges:  OT General Charges $OT Visit: 1 Visit OT Evaluation $OT Eval Low Complexity: 1 Low  Jackelyn Poling OTR/L, MS Acute Rehabilitation Department Office# [phone] Pager# [phone]   Marcellina Millin 06/19/2022, 4:19 PM

## 2022-06-19 NOTE — Progress Notes (Signed)
24 hour chart audit completed 

## 2022-06-19 NOTE — Progress Notes (Signed)
PROGRESS NOTE  [name] WUJ:[id number] DOB: 1956/08/02   PCP: Waylan Rocher, MD  Patient is from: Home.  Lives with husband.  DOA: 06/18/2022 LOS: 1  Chief complaints Rectal prolapse    Brief Narrative / Interim history: 66 year old F with PMH of COPD/chronic hypoxic RF on 3 L at bedtime, diastolic CHF, HTN, hypothyroidism, anxiety, depression, tobacco use disorder, obesity and rectal prolapse admitted by general surgery service for rectal prolapse repair.  She underwent surgery on 08/17/6212 without complication.  Hospitalist service consulted for assistance with management of her comorbidities.  Subjective: Seen and examined earlier this morning.  No major events overnight of this morning.  No complaints other than some abdominal pain from surgery.  Denies chest pain, shortness of breath, cough, nausea or vomiting.  Objective: Vitals:   06/19/22 0625 06/19/22 0821 06/19/22 0823 06/19/22 0858  BP: 118/69   122/63  Pulse: 63   75  Resp: 18   14  Temp: 98.4 F (36.9 C)   98.6 F (37 C)  TempSrc: Oral   Oral  SpO2: 94% 91% 91% 90%  Weight:      Height:        Examination:  GENERAL: No apparent distress.  Nontoxic. HEENT: MMM.  Vision and hearing grossly intact.  NECK: Supple.  No apparent JVD.  RESP:  No IWOB.  Fair aeration bilaterally. CVS:  RRR. Heart sounds normal.  ABD/GI/GU: BS+. Abd soft.  Slightly tender across lower abdomen.  Laparoscopic wound and honeycomb dressing DCI. MSK/EXT:  Moves extremities. No apparent deformity. No edema.  SKIN: no apparent skin lesion or wound NEURO: Awake, alert and oriented appropriately.  No apparent focal neuro deficit. PSYCH: Calm. Normal affect.   Procedures:  7/7-robotic low anterior rectosigmoid resection, rectopexy, excision of left adrenal mass (probable cyst), excision of uterine mass (probable fibroid)  Microbiology summarized: None  Assessment and plan: Principal Problem:   Complete rectal  prolapse Active Problems:   COPD (chronic obstructive pulmonary disease) (H[facility])   Hypertension   Chronic diastolic CHF (congestive heart failure) (H[facility])   Chronic pain syndrome   Lumbar degenerative disc disease   Tobacco abuse   Myofascial pain syndrome   Hypothyroidism   Anxiety and depression   GERD (gastroesophageal reflux disease)   AKI (acute kidney injury) ([place])   Delayed gastric emptying   Chronic respiratory failure with hypoxia (H[facility])   Rectal prolapse s/p rectosigmoid resection -Per primary team.   Chronic COPD/chronic hypoxic RF on 3 L by [place] at bedtime: Stable. -Continue home inhalers -Pulmonary toilet  Chronic diastolic CHF: Appears euvolemic. -Resume home Lasix. -Monitor intake and output, daily weight  Essential hypertension: Normotensive. -Continue home amlodipine and Lasix -Decrease losartan to 50 mg daily.   Hypothyroidism -Continue home Synthroid   Normocytic anemia: Likely dilutional from IV fluid.  Denies bleeding anywhere Recent Labs    06/07/22 1357 06/18/22 1348 06/19/22 0526  HGB 13.3 13.5 11.1*  -Continue monitoring -Check anemia panel in the morning  AKI: Baseline  Cr seems to be 0.8-0.9 but no recent value.  Absence or presence of CKD cannot be determined Recent Labs    06/07/22 1357 06/18/22 1348 06/19/22 0526  BUN 27* 16 15  CREATININE 1.80* 1.21* 1.08*  -Monitor -Decrease losartan.  Tobacco abuse -Encourage smoking cessation -Nicotine patch as needed  Anxiety/depression/RLS/chronic pain syndrome: Stable -Continue home medications  History of seizure disorder -Continue Keppra.  Leukocytosis/bandemia: Likely demargination.  Improved without antibiotics.   GERD -Continue PPI   Hyperglycemia: A1c  5.2%.  Obesity Body mass index is 31.7 kg/m.           DVT prophylaxis:  enoxaparin (LOVENOX) injection 40 mg Start: 06/19/22 0800 SCD's Start: 06/18/22 1451  Code Status: Full code Family Communication: Updated  patient's husband at bedside Level of care: Med-Surg Status is: Inpatient   Final disposition: Per primary team.  Stable for discharge on home medications from medical standpoint  Sch Meds:  Scheduled Meds:  acetaminophen  1,000 mg Oral Q6H   acidophilus  1 capsule Oral Daily   alvimopan  12 mg Oral BID   amLODipine  7.5 mg Oral Daily   ARIPiprazole  10 mg Oral Daily   aspirin EC  81 mg Oral Daily   busPIRone  30 mg Oral BID   enoxaparin (LOVENOX) injection  40 mg Subcutaneous Q24H   escitalopram  30 mg Oral Daily   famotidine  40 mg Oral QHS   feeding supplement  237 mL Oral BID BM   fenofibrate  54 mg Oral Daily   fluticasone furoate-vilanterol  1 puff Inhalation Daily   And   umeclidinium bromide  1 puff Inhalation Daily   furosemide  40 mg Oral Daily   gabapentin  800 mg Oral TID   levETIRAcetam  500 mg Oral Daily   levothyroxine  25 mcg Oral Q0600   lip balm   Topical BID   loratadine  10 mg Oral Daily   losartan  100 mg Oral Daily   mirabegron ER  25 mg Oral Daily   multivitamin with minerals  1 tablet Oral Daily   nystatin   Topical BID   pantoprazole  40 mg Oral q morning   polycarbophil  625 mg Oral BID   pramipexole  0.5 mg Oral QHS   sodium chloride flush  3 mL Intravenous Q12H   Continuous Infusions:  sodium chloride     methocarbamol (ROBAXIN) IV     PRN Meds:.sodium chloride, albuterol, alum & mag hydroxide-simeth, diphenhydrAMINE **OR** diphenhydrAMINE, enalaprilat, fluticasone, hydrALAZINE, HYDROmorphone (DILAUDID) injection, magic mouthwash, melatonin, methocarbamol (ROBAXIN) IV, methocarbamol, metoprolol tartrate, nicotine, ondansetron **OR** ondansetron (ZOFRAN) IV, prochlorperazine **OR** prochlorperazine, simethicone, sodium chloride flush, traMADol  Antimicrobials: Anti-infectives (From admission, onward)    Start     Dose/Rate Route Frequency Ordered Stop   06/18/22 2000  cefoTEtan (CEFOTAN) 2 g in sodium chloride 0.9 % 100 mL IVPB        2  g 200 mL/hr over 30 Minutes Intravenous Every 12 hours 06/18/22 1450 06/18/22 2059   06/18/22 0700  cefoTEtan (CEFOTAN) 2 g in sodium chloride 0.9 % 100 mL IVPB        2 g 200 mL/hr over 30 Minutes Intravenous On call to O.R. 06/18/22 3235 06/18/22 0807        I have personally reviewed the following labs and images: CBC: Recent Labs  Lab 06/18/22 1348 06/19/22 0526  WBC 20.7* 11.9*  NEUTROABS 19.3*  --   HGB 13.5 11.1*  HCT 42.0 34.7*  MCV 99.5 100.9*  PLT 226 199   BMP &GFR Recent Labs  Lab 06/18/22 1348 06/19/22 0526  NA 137 135  K 3.7 3.5  CL 102 99  CO2 27 30  GLUCOSE 183* 82  BUN 16 15  CREATININE 1.21* 1.08*  CALCIUM 9.0 8.8*  MG 2.0 1.9   Estimated Creatinine Clearance: 58.4 mL/min (A) (by C-G formula based on SCr of 1.08 mg/dL (H)). Liver & Pancreas: Recent Labs  Lab 06/18/22 1348  AST  21  ALT 19  ALKPHOS 17*  BILITOT 0.6  PROT 6.4*  ALBUMIN 3.3*   No results for input(s): "LIPASE", "AMYLASE" in the last 168 hours. No results for input(s): "AMMONIA" in the last 168 hours. Diabetic: Recent Labs    06/18/22 0600  HGBA1C 5.2   Recent Labs  Lab 06/17/22 1321 06/18/22 0610  GLUCAP 153* 129*   Cardiac Enzymes: No results for input(s): "CKTOTAL", "CKMB", "CKMBINDEX", "TROPONINI" in the last 168 hours. No results for input(s): "PROBNP" in the last 8760 hours. Coagulation Profile: No results for input(s): "INR", "PROTIME" in the last 168 hours. Thyroid Function Tests: No results for input(s): "TSH", "T4TOTAL", "FREET4", "T3FREE", "THYROIDAB" in the last 72 hours. Lipid Profile: No results for input(s): "CHOL", "HDL", "LDLCALC", "TRIG", "CHOLHDL", "LDLDIRECT" in the last 72 hours. Anemia Panel: No results for input(s): "VITAMINB12", "FOLATE", "FERRITIN", "TIBC", "IRON", "RETICCTPCT" in the last 72 hours. Urine analysis:    Component Value Date/Time   COLORURINE YELLOW (A) 03/04/2022 1513   APPEARANCEUR Clear 05/27/2022 1402   LABSPEC  1.005 03/04/2022 1513   PHURINE 5.0 03/04/2022 1513   GLUCOSEU Negative 05/27/2022 1402   HGBUR NEGATIVE 03/04/2022 1513   BILIRUBINUR Negative 05/27/2022 1402   KETONESUR NEGATIVE 03/04/2022 1513   PROTEINUR Negative 05/27/2022 1402   PROTEINUR NEGATIVE 03/04/2022 1513   UROBILINOGEN 0.2 04/14/2014 1217   NITRITE Negative 05/27/2022 1402   NITRITE NEGATIVE 03/04/2022 1513   LEUKOCYTESUR 2+ (A) 05/27/2022 1402   LEUKOCYTESUR LARGE (A) 03/04/2022 1513   Sepsis Labs: Invalid input(s): "PROCALCITONIN", "LACTICIDVEN"  Microbiology: No results found for this or any previous visit (from the past 240 hour(s)).  Radiology Studies: No results found.    Juley Giovanetti T. Thomas  If 7PM-7AM, please contact night-coverage [email or web address] 06/19/2022, 10:19 AM

## 2022-06-19 NOTE — Progress Notes (Signed)
1 Day Post-Op Robotic Rectopexy and Resection Subjective: Pain controlled, no nausea, passing flatus, tolerating clears  Objective: Vital signs in last 24 hours: Temp:  [97.4 F (36.3 C)-98.8 F (37.1 C)] 98.6 F (37 C) (07/08 0858) Pulse Rate:  [57-75] 75 (07/08 0858) Resp:  [13-18] 14 (07/08 0858) BP: (103-152)/(55-84) 122/63 (07/08 0858) SpO2:  [39 %-99 %] 90 % (07/08 0858) Weight:  [89.1 kg] 89.1 kg (07/08 0500)   Intake/Output from previous day: 07/07 0701 - 07/08 0700 In: 4938.7 [P.O.:720; I.V.:3018.7; IV Piggyback:1200] Out: 2480 [Urine:2145; Drains:185; Blood:150] Intake/Output this shift: Total I/O In: -  Out: 48 [Drains:45]   General appearance: alert and cooperative GI: soft, non-distended  Incision: no significant drainage  Lab Results:  Recent Labs    06/18/22 1348 06/19/22 0526  WBC 20.7* 11.9*  HGB 13.5 11.1*  HCT 42.0 34.7*  PLT 226 199   BMET Recent Labs    06/18/22 1348 06/19/22 0526  NA 137 135  K 3.7 3.5  CL 102 99  CO2 27 30  GLUCOSE 183* 82  BUN 16 15  CREATININE 1.21* 1.08*  CALCIUM 9.0 8.8*   PT/INR No results for input(s): "LABPROT", "INR" in the last 72 hours. ABG No results for input(s): "PHART", "HCO3" in the last 72 hours.  Invalid input(s): "PCO2", "PO2"  MEDS, Scheduled  acetaminophen  1,000 mg Oral Q6H   acidophilus  1 capsule Oral Daily   alvimopan  12 mg Oral BID   amLODipine  7.5 mg Oral Daily   ARIPiprazole  10 mg Oral Daily   aspirin EC  81 mg Oral Daily   busPIRone  30 mg Oral BID   enoxaparin (LOVENOX) injection  40 mg Subcutaneous Q24H   escitalopram  30 mg Oral Daily   famotidine  40 mg Oral QHS   feeding supplement  237 mL Oral BID BM   fenofibrate  54 mg Oral Daily   fluticasone furoate-vilanterol  1 puff Inhalation Daily   And   umeclidinium bromide  1 puff Inhalation Daily   gabapentin  800 mg Oral TID   levETIRAcetam  500 mg Oral Daily   levothyroxine  25 mcg Oral Q0600   lip balm   Topical  BID   loratadine  10 mg Oral Daily   losartan  100 mg Oral Daily   mirabegron ER  25 mg Oral Daily   multivitamin with minerals  1 tablet Oral Daily   nystatin   Topical BID   pantoprazole  40 mg Oral q morning   polycarbophil  625 mg Oral BID   pramipexole  0.5 mg Oral QHS   sodium chloride flush  3 mL Intravenous Q12H    Studies/Results: No results found.  Assessment: s/p Procedure(s): ROBOTIC LOW ANTERIOR RESECTION OF COLON, RECTOSIGMOID, ROBOTIC ASSISTED RECTOPEXY, BILATERAL TAP BLOCK, ASSESSMENT OF TISSUE PERFUSSION VIA FIREFLY INJECTION, EXCISION OF ADNESAL MASS AND UTERINE MASS RIGID PROCTOSCOPY Patient Active Problem List   Diagnosis Date Noted   Complete rectal prolapse 06/18/2022   Chronic respiratory failure with hypoxia ([place]) 06/18/2022   (HFpEF) heart failure with preserved ejection fraction ([place]) 04/06/2022   GERD (gastroesophageal reflux disease) 04/06/2022   Restless leg syndrome 04/06/2022   Seizures ([place]) 04/06/2022   Vomiting without nausea 04/06/2022   CKD (chronic kidney disease) stage 3, GFR 30-59 ml/min ([place]) 04/06/2022   Delayed gastric emptying 04/06/2022   Anxiety and depression 01/25/2021   Acute on chronic respiratory failure with hypoxia ([place]) 11/17/2020   Protein calorie malnutrition (  [place]) 11/17/2020   Hypothyroidism 11/17/2020   Leg pain 02/05/2020   CHF exacerbation ([place]) 12/28/2019   Elevated troponin 12/28/2019   Myofascial pain syndrome 08/23/2018   Lumbar spondylosis 08/23/2018   Leg swelling 07/31/2018   SOB (shortness of breath) 07/31/2018   Tobacco abuse 07/31/2018   Fusion of lumbar spine (L3-S1) 07/20/2018   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   Lumbar degenerative disc disease 07/20/2018   Drug overdose 04/18/2014   Chronic pain syndrome 04/18/2014   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) 01/10/2013   Diabetes mellitus, type 2 ([place]) 51/88/4166   Diastolic CHF, acute on chronic ([place]) 01/10/2013   Osteoarthritis of left knee 10/27/2011   Hypoxemia 03/29/2011   COPD (chronic obstructive pulmonary disease) ([place]) 03/29/2011   Hypertension 03/29/2011   Hypotension 03/25/2011    Expected post op course  Plan: Advance diet.  Pt would like to try fulls today and advance from there Minimize IVF's Ambulate in hall  Appreciate [facility] assistance with this medically complicated pt   LOS: 1 day     .Rosario Adie, MD [facility], [place]    06/19/2022 10:11 AM

## 2022-06-20 LAB — CBC
HCT: 42 % (ref 36.0–46.0)
Hemoglobin: 13.5 g/dL (ref 12.0–15.0)
MCH: 31.9 pg (ref 26.0–34.0)
MCHC: 32.1 g/dL (ref 30.0–36.0)
MCV: 99.3 fL (ref 80.0–100.0)
Platelets: 242 10*3/uL (ref 150–400)
RBC: 4.23 MIL/uL (ref 3.87–5.11)
RDW: 13.7 % (ref 11.5–15.5)
WBC: 11.4 10*3/uL — ABNORMAL HIGH (ref 4.0–10.5)
nRBC: 0 % (ref 0.0–0.2)

## 2022-06-20 LAB — RENAL FUNCTION PANEL
Albumin: 3.4 g/dL — ABNORMAL LOW (ref 3.5–5.0)
Anion gap: 6 (ref 5–15)
BUN: 13 mg/dL (ref 8–23)
CO2: 36 mmol/L — ABNORMAL HIGH (ref 22–32)
Calcium: 9.7 mg/dL (ref 8.9–10.3)
Chloride: 96 mmol/L — ABNORMAL LOW (ref 98–111)
Creatinine, Ser: 1.01 mg/dL — ABNORMAL HIGH (ref 0.44–1.00)
GFR, Estimated: >60 mL/min (ref >60)
Glucose, Bld: 80 mg/dL (ref 70–99)
Phosphorus: 2.3 mg/dL — ABNORMAL LOW (ref 2.5–4.6)
Potassium: 3.5 mmol/L (ref 3.5–5.1)
Sodium: 138 mmol/L (ref 135–145)

## 2022-06-20 LAB — FOLATE: Folate: 19 ng/mL (ref >5.9)

## 2022-06-20 LAB — RETICULOCYTES
Immature Retic Fract: 9 % (ref 2.3–15.9)
RBC.: 4.21 MIL/uL (ref 3.87–5.11)
Retic Count, Absolute: 76.2 10*3/uL (ref 19.0–186.0)
Retic Ct Pct: 1.8 % (ref 0.4–3.1)

## 2022-06-20 LAB — FERRITIN: Ferritin: 135 ng/mL (ref 11–307)

## 2022-06-20 LAB — IRON AND TIBC
Iron: 34 ug/dL (ref 28–170)
Saturation Ratios: 11 % (ref 10.4–31.8)
TIBC: 322 ug/dL (ref 250–450)
UIBC: 288 ug/dL

## 2022-06-20 LAB — MAGNESIUM: Magnesium: 2 mg/dL (ref 1.7–2.4)

## 2022-06-20 LAB — VITAMIN B12: Vitamin B-12: 275 pg/mL (ref 180–914)

## 2022-06-20 MED ORDER — TRAMADOL HCL 50 MG PO TABS: 50.0000 mg | ORAL_TABLET | Freq: Four times a day (QID) | ORAL | 0 refills | Status: DC | PRN

## 2022-06-20 NOTE — Discharge Summary (Signed)
Physician Discharge Summary  Patient ID: [name] MRN: [id number] DOB/AGE: March 19, 1956 66 y.o.  Admit date: 06/18/2022 Discharge date: 06/20/2022  Admission Diagnoses: Complete rectal prolapse  Discharge Diagnoses:  Principal Problem:   Complete rectal prolapse Active Problems:   COPD (chronic obstructive pulmonary disease) (HCC)   Hypertension   Chronic diastolic CHF (congestive heart failure) (HCC)   Chronic pain syndrome   Lumbar degenerative disc disease   Tobacco abuse   Myofascial pain syndrome   Hypothyroidism   Anxiety and depression   GERD (gastroesophageal reflux disease)   AKI (acute kidney injury) ([place])   Delayed gastric emptying   Chronic respiratory failure with hypoxia (HCC)   Normocytic anemia   Discharged Condition: good  Hospital Course: Patient was admitted to the med surg floor after surgery.  Diet was advanced as tolerated.  Patient began to have bowel function on postop day 1.  By postop day 2, she was tolerating a solid diet and pain was controlled with oral medications.  she was urinating without difficulty and ambulating without assistance.  Patient was felt to be in stable condition for discharge to home.   Consults: None  Significant Diagnostic Studies: labs: cbc, bmet  Treatments: IV hydration, analgesia: acetaminophen, and surgery: robotic colon resection and rectopexy  Discharge Exam: Blood pressure 95/78, pulse 73, temperature 97.8 F (36.6 C), temperature source Oral, resp. rate 17, height '5\' 6"'$  (1.676 m), weight 88.4 kg, SpO2 91 %. General appearance: alert and cooperative GI: normal findings: soft, moderate ecchymosis  Incision/Wound: clean, dry, intact  Disposition: home  Discharge Instructions     Call MD for:   Complete by: As directed    FEVER > 101.5 F  (temperatures < 101.5 F are not significant)   Call MD for:  extreme fatigue   Complete by: As directed    Call MD for:  persistant dizziness or light-headedness   Complete  by: As directed    Call MD for:  persistant nausea and vomiting   Complete by: As directed    Call MD for:  redness, tenderness, or signs of infection (pain, swelling, redness, odor or green/yellow discharge around incision site)   Complete by: As directed    Call MD for:  severe uncontrolled pain   Complete by: As directed    Diet - low sodium heart healthy   Complete by: As directed    Start with a bland diet such as soups, liquids, starchy foods, low fat foods, etc. the first few days at home. Gradually advance to a solid, low-fat, high fiber diet by the end of the first week at home.   Add a fiber supplement to your diet (Metamucil, etc) If you feel full, bloated, or constipated, stay on a full liquid or pureed/blenderized diet for a few days until you feel better and are no longer constipated.   Discharge instructions   Complete by: As directed    See Discharge Instructions If you are not getting better after two weeks or are noticing you are getting worse, contact our office [phone] for further advice.  We may need to adjust your medications, re-evaluate you in the office, send you to the emergency room, or see what other things we can do to help. The clinic staff is available to answer your questions during regular business hours (8:30am-5pm).  Please don't hesitate to call and ask to speak to one of our nurses for clinical concerns.    A surgeon from [facility] is  always on call at the hospitals 24 hours/day If you have a medical emergency, go to the nearest emergency room or call 911.   Discharge wound care:   Complete by: As directed    It is good for closed incisions and even open wounds to be washed every day.  Shower every day.  Short baths are fine.  Wash the incisions and wounds clean with soap & water.    You may leave closed incisions open to air if it is dry.   You may cover the incision with clean gauze & replace it after your daily shower for  comfort.  TEGADERM:  You have clear gauze band-aid dressings over your closed incision(s).  Remove the dressings 3 days after surgery = Monday 7/10   Driving Restrictions   Complete by: As directed    You may drive when: - you are no longer taking narcotic prescription pain medication - you can comfortably wear a seatbelt - you can safely make sudden turns/stops without pain.   Increase activity slowly   Complete by: As directed    Start light daily activities --- self-care, walking, climbing stairs- beginning the day after surgery.  Gradually increase activities as tolerated.  Control your pain to be active.  Stop when you are tired.  Ideally, walk several times a day, eventually an hour a day.   Most people are back to most day-to-day activities in a few weeks.  It takes 4-6 weeks to get back to unrestricted, intense activity. If you can walk 30 minutes without difficulty, it is safe to try more intense activity such as jogging, treadmill, bicycling, low-impact aerobics, swimming, etc. Save the most intensive and strenuous activity for last (Usually 4-8 weeks after surgery) such as sit-ups, heavy lifting, contact sports, etc.  Refrain from any intense heavy lifting or straining until you are off narcotics for pain control.  You will have off days, but things should improve week-by-week. DO NOT PUSH THROUGH PAIN.  Let pain be your guide: If it hurts to do something, don't do it.   Lifting restrictions   Complete by: As directed    If you can walk 30 minutes without difficulty, it is safe to try more intense activity such as jogging, treadmill, bicycling, low-impact aerobics, swimming, etc. Save the most intensive and strenuous activity for last (Usually 4-8 weeks after surgery) such as sit-ups, heavy lifting, contact sports, etc.   Refrain from any intense heavy lifting or straining until you are off narcotics for pain control.  You will have off days, but things should improve week-by-week. DO  NOT PUSH THROUGH PAIN.  Let pain be your guide: If it hurts to do something, don't do it.  Pain is your body warning you to avoid that activity for another week until the pain goes down.   May shower / Bathe   Complete by: As directed    May walk up steps   Complete by: As directed    Remove dressing in 72 hours   Complete by: As directed    Make sure all dressings are removed by the third day after surgery.  Leave incisions open to air.  OK to cover incisions with gauze or bandages as desired   Sexual Activity Restrictions   Complete by: As directed    You may have sexual intercourse when it is comfortable. If it hurts to do something, stop.      Allergies as of 06/20/2022       Reactions  Hydrocodone Nausea And Vomiting   Nsaids Other (See Comments)   Pt states it messes up her kidneys But takes aspirin & ibuprofen   Buchu-cornsilk-ch Grass-hydran Other (See Comments)   Thiazide-type Diuretics    Other reaction(s): Unknown        Medication List     TAKE these medications    acidophilus Caps capsule Take 1 capsule by mouth daily.   albuterol 108 (90 Base) MCG/ACT inhaler Commonly known as: VENTOLIN HFA Inhale 2 puffs into the lungs every 4 (four) hours as needed for wheezing or shortness of breath.   amLODipine 5 MG tablet Commonly known as: NORVASC Take 7.5 mg by mouth daily.   Anoro Ellipta 62.5-25 MCG/ACT Aepb Generic drug: umeclidinium-vilanterol Inhale 1 puff into the lungs daily.   ARIPiprazole 10 MG tablet Commonly known as: ABILIFY Take 10 mg by mouth daily.   aspirin EC 81 MG tablet Take 81 mg by mouth daily.   Biofreeze 10 % Crea Generic drug: Menthol (Topical Analgesic) Apply 1 Application topically daily as needed (pain).   busPIRone 30 MG tablet Commonly known as: BUSPAR Take 30 mg by mouth 2 (two) times daily.   Centrum Silver 50+Women Tabs Take 1 tablet by mouth daily.   cetirizine 10 MG tablet Commonly known as: ZYRTEC Take 10 mg by  mouth daily as needed for allergies or rhinitis.   escitalopram 20 MG tablet Commonly known as: LEXAPRO Take 30 mg by mouth daily.   famotidine 40 MG tablet Commonly known as: PEPCID Take 40 mg by mouth at bedtime.   fenofibrate micronized 134 MG capsule Commonly known as: LOFIBRA Take 134 mg by mouth daily before breakfast.   fluticasone 50 MCG/ACT nasal spray Commonly known as: FLONASE Place 1 spray into both nostrils daily as needed for allergies or rhinitis.   furosemide 40 MG tablet Commonly known as: LASIX Take 1 tablet (40 mg total) by mouth daily. May take additional 40 mg (one tablet) as needed for swelling, weight gain or sob   gabapentin 800 MG tablet Commonly known as: NEURONTIN Take 800 mg by mouth 3 (three) times daily.   Gemtesa 75 MG Tabs Generic drug: Vibegron Take 75 mg by mouth daily.   levETIRAcetam 500 MG tablet Commonly known as: KEPPRA Take 1 tablet (500 mg total) by mouth 2 (two) times daily. What changed: when to take this   levothyroxine 25 MCG tablet Commonly known as: SYNTHROID Take 25 mcg by mouth daily before breakfast.   loperamide 2 MG capsule Commonly known as: IMODIUM Take 12 mg by mouth as needed for diarrhea or loose stools.   losartan 100 MG tablet Commonly known as: COZAAR Take 100 mg by mouth daily.   OXYGEN Inhale 3 L into the lungs at bedtime.   pantoprazole 40 MG tablet Commonly known as: PROTONIX Take 40 mg by mouth every morning.   pramipexole 0.5 MG tablet Commonly known as: MIRAPEX Take 0.5 mg by mouth at bedtime.   traMADol 50 MG tablet Commonly known as: ULTRAM Take 1-2 tablets (50-100 mg total) by mouth every 6 (six) hours as needed for moderate pain or severe pain.   Trelegy Ellipta 100-62.5-25 MCG/ACT Aepb Generic drug: Fluticasone-Umeclidin-Vilant Inhale 1 puff into the lungs daily.               Discharge Care Instructions  (From admission, onward)           Start     Ordered    06/18/22 0000  Discharge wound  care:       Comments: It is good for closed incisions and even open wounds to be washed every day.  Shower every day.  Short baths are fine.  Wash the incisions and wounds clean with soap & water.    You may leave closed incisions open to air if it is dry.   You may cover the incision with clean gauze & replace it after your daily shower for comfort.  TEGADERM:  You have clear gauze band-aid dressings over your closed incision(s).  Remove the dressings 3 days after surgery = Monday 7/10   06/18/22 1130            Follow-up Information     Michael Boston, MD Follow up in 3 week(s).   Specialties: General Surgery, Colon and Rectal Surgery Why: To follow up after your operation, To follow up after your hospital stay Contact information: [address] [place] [address] [phone]                 Signed: Rosario Adie 05/16/3837, 11:29 AM

## 2022-06-20 NOTE — Progress Notes (Signed)
PHARMACIST - PHYSICIAN COMMUNICATION DR:   Marcello Moores CONCERNING: Alvimopan   RECOMMENDATION: This patient is receiving alvimopan post-operatively.  Based on criteria approved by the Pharmacy and Therapeutics Committee, the medication will be discontinued.  DESCRIPTION: These criteria include: Patient will receive NO MORE than 15 doses total during current hospitalization If bowel recovery (documented return of bowel sounds and a bowel movement confirmed by RN or patient) occurs before completion of 7 days of therapy, a pharmacist may discontinue alvimopan  If you have questions about this conversion, please contact the Pharmacy Department  '[]'$   [phone] )  [facility] '[]'$   [phone] )  [facility]  '[]'$   [phone] )  [facility] '[x]'$   [phone] )  [facility]   7/9: documented BM per RN this morning  Dimple Nanas, PharmD 06/20/2022 11:31 AM

## 2022-06-20 NOTE — Progress Notes (Signed)
Removed JP drain per order, Pt tolerated well.   Provided discharge education/instructions, all questions and concerns addressed. Pt not in acute distress, Pt to discharge home with belongings.

## 2022-06-20 NOTE — Plan of Care (Signed)
  Problem: Education: Goal: Understanding of discharge needs will improve Outcome: Progressing   Problem: Activity: Goal: Ability to tolerate increased activity will improve Outcome: Progressing   Problem: Bowel/Gastric: Goal: Gastrointestinal status for postoperative course will improve Outcome: Progressing   Problem: Health Behavior/Discharge Planning: Goal: Identification of community resources to assist with postoperative recovery needs will improve Outcome: Progressing   Problem: Clinical Measurements: Goal: Postoperative complications will be avoided or minimized Outcome: Progressing   Problem: Respiratory: Goal: Respiratory status will improve Outcome: Progressing   Problem: Skin Integrity: Goal: Will show signs of wound healing Outcome: Progressing

## 2022-06-20 NOTE — Progress Notes (Signed)
PROGRESS NOTE  MACENZIE BURFORD VZD:[id number] DOB: 19-[name]-1956   PCP: Waylan Rocher, MD  Patient is from: Home.  Lives with husband.  DOA: 06/18/2022 LOS: 2  Chief complaints Rectal prolapse    Brief Narrative / Interim history: 66 year old F with PMH of COPD/chronic hypoxic RF on 3 L at bedtime, diastolic CHF, HTN, hypothyroidism, anxiety, depression, tobacco use disorder, obesity and rectal prolapse admitted by general surgery service for rectal prolapse repair.  She underwent surgery on 01/21/5187 without complication.  Hospitalist service consulted for assistance with management of her comorbid conditions, which seem to be stable  Subjective: Seen and examined earlier this morning.  No major events overnight of this morning.  No complaints.  Feels well and ready to go home.  Objective: Vitals:   06/20/22 0557 06/20/22 0748 06/20/22 0749 06/20/22 0859  BP: 119/63   95/78  Pulse: (!) 50   73  Resp: 17   17  Temp: 97.8 F (36.6 C)   97.8 F (36.6 C)  TempSrc: Oral   Oral  SpO2: 95% 95% 95% 91%  Weight:      Height:        Examination:  GENERAL: No apparent distress.  Nontoxic. HEENT: MMM.  Vision and hearing grossly intact.  NECK: Supple.  No apparent JVD.  RESP: 91% on room air.  No IWOB.  Fair aeration bilaterally. CVS:  RRR. Heart sounds normal.  ABD/GI/GU: BS+. Abd soft, NTND.  Surgical wounds appears clean. MSK/EXT:  Moves extremities. No apparent deformity. No edema.  SKIN: no apparent skin lesion or wound NEURO: Awake and alert. Oriented appropriately.  No apparent focal neuro deficit. PSYCH: Calm. Normal affect.   Procedures:  7/7-robotic low anterior rectosigmoid resection, rectopexy, excision of left adrenal mass (probable cyst), excision of uterine mass (probable fibroid)  Microbiology summarized: None  Assessment and plan: Principal Problem:   Complete rectal prolapse Active Problems:   COPD (chronic obstructive pulmonary disease) (HCC)    Hypertension   Chronic diastolic CHF (congestive heart failure) (HCC)   Chronic pain syndrome   Lumbar degenerative disc disease   Tobacco abuse   Myofascial pain syndrome   Hypothyroidism   Anxiety and depression   GERD (gastroesophageal reflux disease)   AKI (acute kidney injury) ([place])   Delayed gastric emptying   Chronic respiratory failure with hypoxia (HCC)   Normocytic anemia   Rectal prolapse s/p rectosigmoid resection -Per primary team.   Chronic COPD/chronic hypoxic RF on 3 L by [place] at bedtime: Currently saturating at 91% on room air at rest. -Advised to use minimum oxygen to keep saturation above 90% -Continue home inhalers  Chronic diastolic CHF: Appears euvolemic. -Continue home Lasix. -Monitor intake and output, daily weight  Essential hypertension: Normotensive. -Continue home amlodipine and Lasix -Decreased losartan to 50 mg daily.  Can resume home dose on discharge   Hypothyroidism -Continue home Synthroid   Normocytic anemia: Likely dilutional from IV fluid, now resolved.  Anemia panel basically normal. Recent Labs    06/07/22 1357 06/18/22 1348 06/19/22 0526 06/20/22 0500  HGB 13.3 13.5 11.1* 13.5  -Continue monitoring  AKI:b/l Cr seems to be 0.8-0.9 but no recent value.  CKD cannot be determined.  Improving. Recent Labs    06/07/22 1357 06/18/22 1348 06/19/22 0526 06/20/22 0500  BUN 27* '16 15 13  '$ CREATININE 1.80* 1.21* 1.08* 1.01*  -Monitor -Continue decreased dose of losartan in-house.  Tobacco abuse -Encourage smoking cessation -Nicotine patch as needed  Anxiety/depression/RLS/chronic pain syndrome: Stable -Continue home  medications  History of seizure disorder -Continue Keppra.  Leukocytosis/bandemia: Likely demargination.  Improved without antibiotics.   GERD -Continue PPI   Hyperglycemia: A1c 5.2%.  Obesity Body mass index is 31.46 kg/m.           DVT prophylaxis:  enoxaparin (LOVENOX) injection 40 mg Start:  06/19/22 0800 SCD's Start: 06/18/22 1451  Code Status: Full code Family Communication: None at bedside today. Level of care: Med-Surg Status is: Inpatient   Final disposition: Per primary team.  Stable for discharge on home medications from medical standpoint  Sch Meds:  Scheduled Meds:  acetaminophen  1,000 mg Oral Q6H   acidophilus  1 capsule Oral Daily   alvimopan  12 mg Oral BID   amLODipine  7.5 mg Oral Daily   ARIPiprazole  10 mg Oral Daily   aspirin EC  81 mg Oral Daily   busPIRone  30 mg Oral BID   enoxaparin (LOVENOX) injection  40 mg Subcutaneous Q24H   escitalopram  30 mg Oral Daily   famotidine  40 mg Oral QHS   feeding supplement  237 mL Oral BID BM   fenofibrate  54 mg Oral Daily   fluticasone furoate-vilanterol  1 puff Inhalation Daily   And   umeclidinium bromide  1 puff Inhalation Daily   furosemide  40 mg Oral Daily   gabapentin  800 mg Oral TID   levETIRAcetam  500 mg Oral Daily   levothyroxine  25 mcg Oral Q0600   lip balm   Topical BID   loratadine  10 mg Oral Daily   losartan  50 mg Oral Daily   mirabegron ER  25 mg Oral Daily   multivitamin with minerals  1 tablet Oral Daily   nystatin   Topical BID   pantoprazole  40 mg Oral q morning   polycarbophil  625 mg Oral BID   pramipexole  0.5 mg Oral QHS   sodium chloride flush  3 mL Intravenous Q12H   Continuous Infusions:  sodium chloride     methocarbamol (ROBAXIN) IV     PRN Meds:.sodium chloride, albuterol, alum & mag hydroxide-simeth, diphenhydrAMINE **OR** diphenhydrAMINE, enalaprilat, fluticasone, hydrALAZINE, HYDROmorphone (DILAUDID) injection, magic mouthwash, melatonin, methocarbamol (ROBAXIN) IV, methocarbamol, metoprolol tartrate, nicotine, ondansetron **OR** ondansetron (ZOFRAN) IV, prochlorperazine **OR** prochlorperazine, simethicone, sodium chloride flush, traMADol  Antimicrobials: Anti-infectives (From admission, onward)    Start     Dose/Rate Route Frequency Ordered Stop    06/18/22 2000  cefoTEtan (CEFOTAN) 2 g in sodium chloride 0.9 % 100 mL IVPB        2 g 200 mL/hr over 30 Minutes Intravenous Every 12 hours 06/18/22 1450 06/18/22 2059   06/18/22 0700  cefoTEtan (CEFOTAN) 2 g in sodium chloride 0.9 % 100 mL IVPB        2 g 200 mL/hr over 30 Minutes Intravenous On call to O.R. 06/18/22 7106 06/18/22 0807        I have personally reviewed the following labs and images: CBC: Recent Labs  Lab 06/18/22 1348 06/19/22 0526 06/20/22 0500  WBC 20.7* 11.9* 11.4*  NEUTROABS 19.3*  --   --   HGB 13.5 11.1* 13.5  HCT 42.0 34.7* 42.0  MCV 99.5 100.9* 99.3  PLT 226 199 242   BMP &GFR Recent Labs  Lab 06/18/22 1348 06/19/22 0526 06/20/22 0500  NA 137 135 138  K 3.7 3.5 3.5  CL 102 99 96*  CO2 27 30 36*  GLUCOSE 183* 82 80  BUN 16 15  13  CREATININE 1.21* 1.08* 1.01*  CALCIUM 9.0 8.8* 9.7  MG 2.0 1.9 2.0  PHOS  --   --  2.3*   Estimated Creatinine Clearance: 62.2 mL/min (A) (by C-G formula based on SCr of 1.01 mg/dL (H)). Liver & Pancreas: Recent Labs  Lab 06/18/22 1348 06/20/22 0500  AST 21  --   ALT 19  --   ALKPHOS 17*  --   BILITOT 0.6  --   PROT 6.4*  --   ALBUMIN 3.3* 3.4*   No results for input(s): "LIPASE", "AMYLASE" in the last 168 hours. No results for input(s): "AMMONIA" in the last 168 hours. Diabetic: Recent Labs    06/18/22 0600  HGBA1C 5.2   Recent Labs  Lab 06/17/22 1321 06/18/22 0610  GLUCAP 153* 129*   Cardiac Enzymes: No results for input(s): "CKTOTAL", "CKMB", "CKMBINDEX", "TROPONINI" in the last 168 hours. No results for input(s): "PROBNP" in the last 8760 hours. Coagulation Profile: No results for input(s): "INR", "PROTIME" in the last 168 hours. Thyroid Function Tests: No results for input(s): "TSH", "T4TOTAL", "FREET4", "T3FREE", "THYROIDAB" in the last 72 hours. Lipid Profile: No results for input(s): "CHOL", "HDL", "LDLCALC", "TRIG", "CHOLHDL", "LDLDIRECT" in the last 72 hours. Anemia  Panel: Recent Labs    06/20/22 0500  VITAMINB12 275  FOLATE 19.0  FERRITIN 135  TIBC 322  IRON 34  RETICCTPCT 1.8   Urine analysis:    Component Value Date/Time   COLORURINE YELLOW (A) 03/04/2022 1513   APPEARANCEUR Clear 05/27/2022 1402   LABSPEC 1.005 03/04/2022 1513   PHURINE 5.0 03/04/2022 1513   GLUCOSEU Negative 05/27/2022 1402   HGBUR NEGATIVE 03/04/2022 1513   BILIRUBINUR Negative 05/27/2022 1402   KETONESUR NEGATIVE 03/04/2022 1513   PROTEINUR Negative 05/27/2022 1402   PROTEINUR NEGATIVE 03/04/2022 1513   UROBILINOGEN 0.2 04/14/2014 1217   NITRITE Negative 05/27/2022 1402   NITRITE NEGATIVE 03/04/2022 1513   LEUKOCYTESUR 2+ (A) 05/27/2022 1402   LEUKOCYTESUR LARGE (A) 03/04/2022 1513   Sepsis Labs: Invalid input(s): "PROCALCITONIN", "LACTICIDVEN"  Microbiology: No results found for this or any previous visit (from the past 240 hour(s)).  Radiology Studies: No results found.    Jaelen Soth T. Duncan  If 7PM-7AM, please contact night-coverage [email or web address] 06/20/2022, 9:50 AM

## 2022-06-21 ENCOUNTER — Other Ambulatory Visit (HOSPITAL_COMMUNITY): Payer: Self-pay

## 2022-06-21 LAB — SURGICAL PATHOLOGY

## 2022-06-22 LAB — TYPE AND SCREEN
ABO/RH(D): A POS
Antibody Screen: POSITIVE
Unit division: 0
Unit division: 0

## 2022-06-22 LAB — BPAM RBC
Blood Product Expiration Date: 202307272359
Blood Product Expiration Date: 202307272359
Unit Type and Rh: 6200
Unit Type and Rh: 6200

## 2022-06-22 NOTE — Anesthesia Postprocedure Evaluation (Signed)
Anesthesia Post Note  Patient: [name]  Procedure(s) Performed: COLONOSCOPY WITH PROPOFOL  Patient location during evaluation: PACU Anesthesia Type: General Level of consciousness: awake and alert Pain management: pain level controlled Vital Signs Assessment: post-procedure vital signs reviewed and stable Respiratory status: spontaneous breathing, nonlabored ventilation, respiratory function stable and patient connected to nasal cannula oxygen Cardiovascular status: blood pressure returned to baseline and stable Postop Assessment: no apparent nausea or vomiting Anesthetic complications: no   No notable events documented.   Last Vitals:  Vitals:   06/17/22 1305 06/17/22 1413  BP: 115/80   Pulse: (!) 107   Resp: 20   Temp: 36.7 C (P) 36.8 C  SpO2: 92%     Last Pain:  Vitals:   06/17/22 1413  TempSrc: (P) Temporal  PainSc:                  Molli Barrows

## 2022-07-06 ENCOUNTER — Inpatient Hospital Stay
Admission: EM | Admit: 2022-07-06 | Discharge: 2022-07-09 | DRG: 871 | Disposition: A | Discharge status: Home Health | Payer: 59 | Attending: Internal Medicine | Admitting: Internal Medicine

## 2022-07-06 ENCOUNTER — Emergency Department: Payer: 59

## 2022-07-06 DIAGNOSIS — E039 Hypothyroidism, unspecified: Secondary | ICD-10-CM

## 2022-07-06 DIAGNOSIS — F17210 Nicotine dependence, cigarettes, uncomplicated: Secondary | ICD-10-CM | POA: Diagnosis present

## 2022-07-06 DIAGNOSIS — I5032 Chronic diastolic (congestive) heart failure: Secondary | ICD-10-CM | POA: Diagnosis present

## 2022-07-06 DIAGNOSIS — I110 Hypertensive heart disease with heart failure: Secondary | ICD-10-CM | POA: Diagnosis present

## 2022-07-06 DIAGNOSIS — J9622 Acute and chronic respiratory failure with hypercapnia: Secondary | ICD-10-CM | POA: Diagnosis present

## 2022-07-06 DIAGNOSIS — G40909 Epilepsy, unspecified, not intractable, without status epilepticus: Secondary | ICD-10-CM | POA: Diagnosis present

## 2022-07-06 DIAGNOSIS — A419 Sepsis, unspecified organism: Principal | ICD-10-CM | POA: Diagnosis present

## 2022-07-06 DIAGNOSIS — Z96653 Presence of artificial knee joint, bilateral: Secondary | ICD-10-CM | POA: Diagnosis present

## 2022-07-06 DIAGNOSIS — J441 Chronic obstructive pulmonary disease with (acute) exacerbation: Secondary | ICD-10-CM | POA: Diagnosis not present

## 2022-07-06 DIAGNOSIS — E871 Hypo-osmolality and hyponatremia: Secondary | ICD-10-CM | POA: Diagnosis present

## 2022-07-06 DIAGNOSIS — Z66 Do not resuscitate: Secondary | ICD-10-CM | POA: Diagnosis present

## 2022-07-06 DIAGNOSIS — Z9981 Dependence on supplemental oxygen: Secondary | ICD-10-CM | POA: Diagnosis not present

## 2022-07-06 DIAGNOSIS — Z79899 Other long term (current) drug therapy: Secondary | ICD-10-CM

## 2022-07-06 DIAGNOSIS — J9601 Acute respiratory failure with hypoxia: Secondary | ICD-10-CM

## 2022-07-06 DIAGNOSIS — Z20822 Contact with and (suspected) exposure to covid-19: Secondary | ICD-10-CM | POA: Diagnosis present

## 2022-07-06 DIAGNOSIS — R6520 Severe sepsis without septic shock: Secondary | ICD-10-CM | POA: Diagnosis not present

## 2022-07-06 DIAGNOSIS — J189 Pneumonia, unspecified organism: Secondary | ICD-10-CM | POA: Diagnosis present

## 2022-07-06 DIAGNOSIS — Z885 Allergy status to narcotic agent status: Secondary | ICD-10-CM | POA: Diagnosis not present

## 2022-07-06 DIAGNOSIS — Z79890 Hormone replacement therapy (postmenopausal): Secondary | ICD-10-CM

## 2022-07-06 DIAGNOSIS — E1142 Type 2 diabetes mellitus with diabetic polyneuropathy: Secondary | ICD-10-CM

## 2022-07-06 DIAGNOSIS — E785 Hyperlipidemia, unspecified: Secondary | ICD-10-CM | POA: Diagnosis present

## 2022-07-06 DIAGNOSIS — N179 Acute kidney failure, unspecified: Secondary | ICD-10-CM | POA: Diagnosis present

## 2022-07-06 DIAGNOSIS — E1151 Type 2 diabetes mellitus with diabetic peripheral angiopathy without gangrene: Secondary | ICD-10-CM | POA: Diagnosis present

## 2022-07-06 DIAGNOSIS — Z7951 Long term (current) use of inhaled steroids: Secondary | ICD-10-CM

## 2022-07-06 DIAGNOSIS — J9621 Acute and chronic respiratory failure with hypoxia: Secondary | ICD-10-CM

## 2022-07-06 DIAGNOSIS — Z7982 Long term (current) use of aspirin: Secondary | ICD-10-CM

## 2022-07-06 DIAGNOSIS — J439 Emphysema, unspecified: Secondary | ICD-10-CM | POA: Diagnosis present

## 2022-07-06 DIAGNOSIS — Z8249 Family history of ischemic heart disease and other diseases of the circulatory system: Secondary | ICD-10-CM

## 2022-07-06 DIAGNOSIS — K219 Gastro-esophageal reflux disease without esophagitis: Secondary | ICD-10-CM | POA: Diagnosis present

## 2022-07-06 DIAGNOSIS — Z888 Allergy status to other drugs, medicaments and biological substances status: Secondary | ICD-10-CM | POA: Diagnosis not present

## 2022-07-06 LAB — CBC WITH DIFFERENTIAL/PLATELET
Abs Immature Granulocytes: 0.06 10*3/uL (ref 0.00–0.07)
Basophils Absolute: 0 10*3/uL (ref 0.0–0.1)
Basophils Relative: 0 %
Eosinophils Absolute: 0 10*3/uL (ref 0.0–0.5)
Eosinophils Relative: 0 %
HCT: 38.2 % (ref 36.0–46.0)
Hemoglobin: 12.3 g/dL (ref 12.0–15.0)
Immature Granulocytes: 1 %
Lymphocytes Relative: 3 %
Lymphs Abs: 0.4 10*3/uL — ABNORMAL LOW (ref 0.7–4.0)
MCH: 31.1 pg (ref 26.0–34.0)
MCHC: 32.2 g/dL (ref 30.0–36.0)
MCV: 96.5 fL (ref 80.0–100.0)
Monocytes Absolute: 0.2 10*3/uL (ref 0.1–1.0)
Monocytes Relative: 2 %
Neutro Abs: 10.8 10*3/uL — ABNORMAL HIGH (ref 1.7–7.7)
Neutrophils Relative %: 94 %
Platelets: 204 10*3/uL (ref 150–400)
RBC: 3.96 MIL/uL (ref 3.87–5.11)
RDW: 13.8 % (ref 11.5–15.5)
WBC: 11.5 10*3/uL — ABNORMAL HIGH (ref 4.0–10.5)
nRBC: 0 % (ref 0.0–0.2)

## 2022-07-06 LAB — BLOOD GAS, VENOUS
Acid-Base Excess: 3.5 mmol/L — ABNORMAL HIGH (ref 0.0–2.0)
Bicarbonate: 29.1 mmol/L — ABNORMAL HIGH (ref 20.0–28.0)
O2 Saturation: 90 %
Patient temperature: 37
pCO2, Ven: 47 mmHg (ref 44–60)
pH, Ven: 7.4 (ref 7.25–7.43)
pO2, Ven: 59 mmHg — ABNORMAL HIGH (ref 32–45)

## 2022-07-06 LAB — URINALYSIS, COMPLETE (UACMP) WITH MICROSCOPIC
Bilirubin Urine: NEGATIVE
Glucose, UA: NEGATIVE mg/dL
Ketones, ur: NEGATIVE mg/dL
Nitrite: POSITIVE — AB
Protein, ur: 100 mg/dL — AB
Specific Gravity, Urine: 1.015 (ref 1.005–1.030)
WBC, UA: >50 WBC/hpf — ABNORMAL HIGH (ref 0–5)
pH: 6 (ref 5.0–8.0)

## 2022-07-06 LAB — COMPREHENSIVE METABOLIC PANEL
ALT: 14 U/L (ref 0–44)
AST: 23 U/L (ref 15–41)
Albumin: 3 g/dL — ABNORMAL LOW (ref 3.5–5.0)
Alkaline Phosphatase: 26 U/L — ABNORMAL LOW (ref 38–126)
Anion gap: 7 (ref 5–15)
BUN: 20 mg/dL (ref 8–23)
CO2: 27 mmol/L (ref 22–32)
Calcium: 8.8 mg/dL — ABNORMAL LOW (ref 8.9–10.3)
Chloride: 99 mmol/L (ref 98–111)
Creatinine, Ser: 1.54 mg/dL — ABNORMAL HIGH (ref 0.44–1.00)
GFR, Estimated: 37 mL/min — ABNORMAL LOW (ref >60)
Glucose, Bld: 125 mg/dL — ABNORMAL HIGH (ref 70–99)
Potassium: 4.2 mmol/L (ref 3.5–5.1)
Sodium: 133 mmol/L — ABNORMAL LOW (ref 135–145)
Total Bilirubin: 1.4 mg/dL — ABNORMAL HIGH (ref 0.3–1.2)
Total Protein: 6.4 g/dL — ABNORMAL LOW (ref 6.5–8.1)

## 2022-07-06 LAB — APTT: aPTT: 37 seconds — ABNORMAL HIGH (ref 24–36)

## 2022-07-06 LAB — RESP PANEL BY RT-PCR (FLU A&B, COVID) ARPGX2
Influenza A by PCR: NEGATIVE
Influenza B by PCR: NEGATIVE
SARS Coronavirus 2 by RT PCR: NEGATIVE

## 2022-07-06 LAB — PROTIME-INR
INR: 1.3 — ABNORMAL HIGH (ref 0.8–1.2)
Prothrombin Time: 15.9 seconds — ABNORMAL HIGH (ref 11.4–15.2)

## 2022-07-06 LAB — LACTIC ACID, PLASMA
Lactic Acid, Venous: 0.7 mmol/L (ref 0.5–1.9)
Lactic Acid, Venous: 0.6 mmol/L (ref 0.5–1.9)

## 2022-07-06 MED ORDER — ONDANSETRON HCL 4 MG PO TABS: 4.0000 mg | ORAL_TABLET | Freq: Four times a day (QID) | ORAL | Status: DC | PRN

## 2022-07-06 MED ORDER — ACETAMINOPHEN 325 MG PO TABS
650.0000 mg | ORAL_TABLET | Freq: Once | ORAL | Status: AC
  Administered 2022-07-06: 650 mg via ORAL
  Filled 2022-07-06: qty 2

## 2022-07-06 MED ORDER — PREDNISONE 20 MG PO TABS
40.0000 mg | ORAL_TABLET | Freq: Every day | ORAL | Status: DC
  Administered 2022-07-07 – 2022-07-09 (×3): 40 mg via ORAL
  Filled 2022-07-06 (×4): qty 2

## 2022-07-06 MED ORDER — SODIUM CHLORIDE 0.9 % IV BOLUS (SEPSIS)
500.0000 mL | Freq: Once | INTRAVENOUS | Status: AC
  Administered 2022-07-06: 500 mL via INTRAVENOUS

## 2022-07-06 MED ORDER — PRAMIPEXOLE DIHYDROCHLORIDE 0.25 MG PO TABS
0.5000 mg | ORAL_TABLET | Freq: Every day | ORAL | Status: DC
Start: 2022-07-06 — End: 2022-07-09
  Administered 2022-07-06 – 2022-07-08 (×3): 0.5 mg via ORAL
  Filled 2022-07-06 (×3): qty 2

## 2022-07-06 MED ORDER — ASPIRIN 81 MG PO TBEC
81.0000 mg | DELAYED_RELEASE_TABLET | Freq: Every day | ORAL | Status: DC
  Administered 2022-07-07 – 2022-07-09 (×3): 81 mg via ORAL
  Filled 2022-07-06 (×3): qty 1

## 2022-07-06 MED ORDER — ACETAMINOPHEN 325 MG PO TABS: 650.0000 mg | ORAL_TABLET | Freq: Four times a day (QID) | ORAL | Status: DC | PRN

## 2022-07-06 MED ORDER — MIDODRINE HCL 5 MG PO TABS
10.0000 mg | ORAL_TABLET | Freq: Three times a day (TID) | ORAL | Status: DC
  Administered 2022-07-06 – 2022-07-09 (×9): 10 mg via ORAL
  Filled 2022-07-06 (×8): qty 2

## 2022-07-06 MED ORDER — SODIUM CHLORIDE 0.9 % IV SOLN
500.0000 mg | INTRAVENOUS | Status: AC
  Administered 2022-07-07 – 2022-07-09 (×3): 500 mg via INTRAVENOUS
  Filled 2022-07-06 (×4): qty 5

## 2022-07-06 MED ORDER — LOPERAMIDE HCL 2 MG PO CAPS: 12.0000 mg | ORAL_CAPSULE | ORAL | Status: DC | PRN

## 2022-07-06 MED ORDER — BUSPIRONE HCL 10 MG PO TABS
30.0000 mg | ORAL_TABLET | Freq: Two times a day (BID) | ORAL | Status: DC
  Administered 2022-07-06 – 2022-07-09 (×6): 30 mg via ORAL
  Filled 2022-07-06 (×6): qty 3

## 2022-07-06 MED ORDER — SODIUM CHLORIDE 0.9 % IV BOLUS
500.0000 mL | Freq: Once | INTRAVENOUS | Status: AC
  Administered 2022-07-06: 500 mL via INTRAVENOUS

## 2022-07-06 MED ORDER — MIRABEGRON ER 25 MG PO TB24
25.0000 mg | ORAL_TABLET | Freq: Every day | ORAL | Status: DC
  Administered 2022-07-07 – 2022-07-09 (×3): 25 mg via ORAL
  Filled 2022-07-06 (×3): qty 1

## 2022-07-06 MED ORDER — SODIUM CHLORIDE 0.9 % IV BOLUS (SEPSIS)
1000.0000 mL | Freq: Once | INTRAVENOUS | Status: AC
  Administered 2022-07-06: 1000 mL via INTRAVENOUS

## 2022-07-06 MED ORDER — AZITHROMYCIN 500 MG IV SOLR
500.0000 mg | Freq: Once | INTRAVENOUS | Status: AC
Start: 2022-07-06 — End: 2022-07-06
  Administered 2022-07-06: 500 mg via INTRAVENOUS
  Filled 2022-07-06: qty 5

## 2022-07-06 MED ORDER — ENOXAPARIN SODIUM 40 MG/0.4ML IJ SOSY
40.0000 mg | PREFILLED_SYRINGE | INTRAMUSCULAR | Status: DC
  Administered 2022-07-06 – 2022-07-08 (×3): 40 mg via SUBCUTANEOUS
  Filled 2022-07-06 (×3): qty 0.4

## 2022-07-06 MED ORDER — PANTOPRAZOLE SODIUM 40 MG PO TBEC
40.0000 mg | DELAYED_RELEASE_TABLET | Freq: Every morning | ORAL | Status: DC
  Administered 2022-07-07 – 2022-07-09 (×3): 40 mg via ORAL
  Filled 2022-07-06 (×3): qty 1

## 2022-07-06 MED ORDER — SODIUM CHLORIDE 0.9 % IV SOLN
INTRAVENOUS | Status: DC
Start: 2022-07-06 — End: 2022-07-08

## 2022-07-06 MED ORDER — ADULT MULTIVITAMIN W/MINERALS CH
1.0000 | ORAL_TABLET | Freq: Every day | ORAL | Status: DC
  Administered 2022-07-07 – 2022-07-09 (×3): 1 via ORAL
  Filled 2022-07-06 (×3): qty 1

## 2022-07-06 MED ORDER — LEVETIRACETAM 500 MG PO TABS
500.0000 mg | ORAL_TABLET | Freq: Every day | ORAL | Status: DC
Start: 2022-07-06 — End: 2022-07-09
  Administered 2022-07-06 – 2022-07-09 (×4): 500 mg via ORAL
  Filled 2022-07-06 (×4): qty 1

## 2022-07-06 MED ORDER — GABAPENTIN 400 MG PO CAPS
800.0000 mg | ORAL_CAPSULE | Freq: Three times a day (TID) | ORAL | Status: DC
  Administered 2022-07-06 – 2022-07-09 (×8): 800 mg via ORAL
  Filled 2022-07-06 (×9): qty 2

## 2022-07-06 MED ORDER — MAGNESIUM HYDROXIDE 400 MG/5ML PO SUSP: 30.0000 mL | Freq: Every day | ORAL | Status: DC | PRN

## 2022-07-06 MED ORDER — TRAMADOL HCL 50 MG PO TABS: 50.0000 mg | ORAL_TABLET | Freq: Four times a day (QID) | ORAL | Status: DC | PRN

## 2022-07-06 MED ORDER — TRAZODONE HCL 50 MG PO TABS
25.0000 mg | ORAL_TABLET | Freq: Every evening | ORAL | Status: DC | PRN
  Administered 2022-07-08: 25 mg via ORAL
  Filled 2022-07-06: qty 1

## 2022-07-06 MED ORDER — ACETAMINOPHEN 650 MG RE SUPP: 650.0000 mg | Freq: Four times a day (QID) | RECTAL | Status: DC | PRN

## 2022-07-06 MED ORDER — ONDANSETRON HCL 4 MG/2ML IJ SOLN: 4.0000 mg | Freq: Four times a day (QID) | INTRAMUSCULAR | Status: DC | PRN

## 2022-07-06 MED ORDER — SODIUM CHLORIDE 0.9 % IV SOLN
2.0000 g | INTRAVENOUS | Status: DC
  Administered 2022-07-06 – 2022-07-08 (×3): 2 g via INTRAVENOUS
  Filled 2022-07-06: qty 20
  Filled 2022-07-06: qty 2
  Filled 2022-07-06: qty 20

## 2022-07-06 MED ORDER — IPRATROPIUM-ALBUTEROL 0.5-2.5 (3) MG/3ML IN SOLN
3.0000 mL | Freq: Once | RESPIRATORY_TRACT | Status: AC
  Administered 2022-07-06: 3 mL via RESPIRATORY_TRACT
  Filled 2022-07-06: qty 3

## 2022-07-06 MED ORDER — FAMOTIDINE 20 MG PO TABS
20.0000 mg | ORAL_TABLET | Freq: Every day | ORAL | Status: DC
  Administered 2022-07-06 – 2022-07-08 (×3): 20 mg via ORAL
  Filled 2022-07-06 (×3): qty 1

## 2022-07-06 MED ORDER — AMLODIPINE BESYLATE 5 MG PO TABS
7.5000 mg | ORAL_TABLET | Freq: Every day | ORAL | Status: DC
  Administered 2022-07-07 – 2022-07-09 (×3): 7.5 mg via ORAL
  Filled 2022-07-06 (×3): qty 2

## 2022-07-06 MED ORDER — RISAQUAD PO CAPS
1.0000 | ORAL_CAPSULE | Freq: Every day | ORAL | Status: DC
  Administered 2022-07-07 – 2022-07-09 (×3): 1 via ORAL
  Filled 2022-07-06 (×3): qty 1

## 2022-07-06 MED ORDER — FLUTICASONE PROPIONATE 50 MCG/ACT NA SUSP
1.0000 | Freq: Every day | NASAL | Status: DC | PRN
Start: 2022-07-06 — End: 2022-07-09

## 2022-07-06 MED ORDER — HYDROXYZINE HCL 25 MG PO TABS: 25.0000 mg | ORAL_TABLET | Freq: Four times a day (QID) | ORAL | Status: DC | PRN

## 2022-07-06 MED ORDER — SODIUM CHLORIDE 0.9 % IV BOLUS
1000.0000 mL | Freq: Once | INTRAVENOUS | Status: AC
  Administered 2022-07-06: 1000 mL via INTRAVENOUS

## 2022-07-06 MED ORDER — LORATADINE 10 MG PO TABS
10.0000 mg | ORAL_TABLET | Freq: Every day | ORAL | Status: DC
  Administered 2022-07-07 – 2022-07-09 (×3): 10 mg via ORAL
  Filled 2022-07-06 (×3): qty 1

## 2022-07-06 MED ORDER — SODIUM CHLORIDE 0.9 % IV SOLN
2.0000 g | Freq: Once | INTRAVENOUS | Status: AC
  Administered 2022-07-06: 2 g via INTRAVENOUS
  Filled 2022-07-06: qty 12.5

## 2022-07-06 MED ORDER — ARIPIPRAZOLE 10 MG PO TABS
10.0000 mg | ORAL_TABLET | Freq: Every day | ORAL | Status: DC
  Administered 2022-07-07 – 2022-07-09 (×3): 10 mg via ORAL
  Filled 2022-07-06 (×3): qty 1

## 2022-07-06 MED ORDER — IPRATROPIUM-ALBUTEROL 0.5-2.5 (3) MG/3ML IN SOLN
3.0000 mL | Freq: Four times a day (QID) | RESPIRATORY_TRACT | Status: DC
  Administered 2022-07-06 – 2022-07-08 (×7): 3 mL via RESPIRATORY_TRACT
  Filled 2022-07-06 (×7): qty 3

## 2022-07-06 MED ORDER — METHYLPREDNISOLONE SODIUM SUCC 125 MG IJ SOLR
80.0000 mg | INTRAMUSCULAR | Status: AC
  Administered 2022-07-06: 80 mg via INTRAVENOUS
  Filled 2022-07-06: qty 2

## 2022-07-06 MED ORDER — OXYCODONE HCL 5 MG PO TABS: 5.0000 mg | ORAL_TABLET | Freq: Four times a day (QID) | ORAL | Status: DC | PRN

## 2022-07-06 MED ORDER — FENOFIBRATE 160 MG PO TABS
160.0000 mg | ORAL_TABLET | Freq: Every day | ORAL | Status: DC
  Administered 2022-07-07 – 2022-07-09 (×3): 160 mg via ORAL
  Filled 2022-07-06 (×3): qty 1

## 2022-07-06 MED ORDER — ESCITALOPRAM OXALATE 10 MG PO TABS
30.0000 mg | ORAL_TABLET | Freq: Every day | ORAL | Status: DC
  Administered 2022-07-06 – 2022-07-09 (×4): 30 mg via ORAL
  Filled 2022-07-06 (×4): qty 3

## 2022-07-06 MED ORDER — LEVOTHYROXINE SODIUM 25 MCG PO TABS
25.0000 ug | ORAL_TABLET | Freq: Every day | ORAL | Status: DC
  Administered 2022-07-07 – 2022-07-09 (×3): 25 ug via ORAL
  Filled 2022-07-06 (×3): qty 1

## 2022-07-06 NOTE — Progress Notes (Signed)
Notified provider and bedside nurse of need to order and administer fluid bolus needs 2250 cc.

## 2022-07-06 NOTE — H&P (Signed)
[place]   PATIENT NAME: [name]    MR#:  [id number]  DATE OF BIRTH:  1956/02/23  DATE OF ADMISSION:  07/06/2022  PRIMARY CARE PHYSICIAN: Entzminger, Mendel Corning, MD   Patient is coming from: Home  REQUESTING/REFERRING PHYSICIAN: Merlyn Lot, MD  CHIEF COMPLAINT:   Chief Complaint  Patient presents with   Shortness of Breath    Sob , fevwer, tacky, ems administered solumedrol 125, douneb x 1 by ems     HISTORY OF PRESENT ILLNESS:  [name] is a 66 y.o. female with medical history significant for heart failure with preserved ejection fraction, COPD on home O2 at 3 L/min, ongoing tobacco abuse, GERD, hypertension, hypothyroidism, dyslipidemia, seizure disorder and anxiety, who presented to the emergency room with acute onset of worsening dyspnea with associated cough that is mainly dry for the last 3 days.  She developed a fever today as well and has hypoxia on her home O2.  Her Pulsoxymeter was down to 70%.  She had a recent rectal prolapse surgery.  She denies any chest pain or palpitations.  She was febrile here.  She continues to smoke 1 pack of status.  They.  She has not used her nebulizer today.  No nausea or vomiting or abdominal pain.  No bleeding diathesis.  No dysuria, oliguria or hematuria or flank pain. ED Course: When she came to the ER, BP was 96/45 with a heart rate of 88 respiratory to 21% to 70% on room air.  Pulse symmetry was up to 93% on nonrebreather mask at 12 L/min initially required BiPAP with improvement of her respiratory rate and work of breathing. EKG as reviewed by me : EKG showed sinus rhythm with a rate of 97 with probable left atrial enlargement, poor R wave progression and low voltage QRS. Imaging: Portable chest x-ray showed streaky basilar opacities in the right lower lobe concerning for pneumonia with recommendation for follow-up PA and lateral chest x-ray in 3 to 4 weeks to ensure resolution and exclude malignancy.  The patient  was given 1 L bolus of IV normal saline and will be given 30 mL/kg total, 2 DuoNebs, 2 g of IV cefepime and 500 mg of IV Zithromax as well as 650 mg p.o. Tylenol.  She will be admitted to a PCU bed for further evaluation and management. PAST MEDICAL HISTORY:   Past Medical History:  Diagnosis Date   (HFpEF) heart failure with preserved ejection fraction (HCC)    Acute renal failure (HCC) 03/29/2011   Acute respiratory distress 11/2020   Acute respiratory failure (HCC) 04/14/2014   Anxiety    Arthritis    CHF (congestive heart failure) (HCC)    Chronic respiratory failure with hypoxia and hypercapnia ([place])    Community acquired pneumonia 01/10/2013   COPD (chronic obstructive pulmonary disease) (HCC)    no inhalers--smoker, no oxygen   Depression    Dyspnea    Emphysema lung (HCC)    GERD (gastroesophageal reflux disease)    Hyperlipidemia    Hypertension    Hypothyroid    Peripheral vascular disease ([place])    Pre-diabetes    Restless leg syndrome    Seizures ([place])    02/13/2011 -hx of seizure due to "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  no seizure since--pt does take keppra and office note from neurologist dr. Leta Baptist on this chart  Urinary incontinence     PAST SURGICAL HISTORY:   Past Surgical History:  Procedure Laterality Date   CARPAL TUNNEL RELEASE     left   COLONOSCOPY WITH PROPOFOL N/A 06/17/2022   Procedure: COLONOSCOPY WITH PROPOFOL;  Surgeon: Lesly Rubenstein, MD;  Location: [facility] ENDOSCOPY;  Service: Endoscopy;  Laterality: N/A;   JOINT REPLACEMENT  08/26/2011   right total knee arthroplasty, left total knee   KNEE ARTHROPLASTY  10/28/2011   Procedure: COMPUTER ASSISTED TOTAL KNEE ARTHROPLASTY;  Surgeon: Karen Chafe Rendall III;  Location: [facility] ORS;  Service: Orthopedics;  Laterality: Left;  preop femoral nerve block   KNEE SURGERY     right   laminectomy and  diskectomy     L4-5 with fusion   PROCTOSCOPY N/A 06/18/2022   Procedure: RIGID PROCTOSCOPY;  Surgeon: Michael Boston, MD;  Location: [facility] ORS;  Service: General;  Laterality: N/A;    SOCIAL HISTORY:   Social History   Tobacco Use   Smoking status: Every Day    Packs/day: 1.00    Years: 48.00    Total pack years: 48.00    Types: Cigarettes   Smokeless tobacco: Never  Substance Use Topics   Alcohol use: Yes    Comment: occasional    FAMILY HISTORY:   Family History  Problem Relation Age of Onset   Heart attack Father 41   Alzheimer's disease Mother    Breast cancer Neg Hx     DRUG ALLERGIES:   Allergies  Allergen Reactions   Hydrocodone Nausea And Vomiting   Nsaids Other (See Comments)    Pt states it messes up her kidneys But takes aspirin & ibuprofen   Buchu-[place]-Hydran Other (See Comments)   Thiazide-Type Diuretics     Other reaction(s): Unknown    REVIEW OF SYSTEMS:   ROS As per history of present illness. All pertinent systems were reviewed above. Constitutional, HEENT, cardiovascular, respiratory, GI, GU, musculoskeletal, neuro, psychiatric, endocrine, integumentary and hematologic systems were reviewed and are otherwise negative/unremarkable except for positive findings mentioned above in the HPI.   MEDICATIONS AT HOME:   Prior to Admission medications   Medication Sig Start Date End Date Taking? Authorizing Provider  acidophilus (RISAQUAD) CAPS capsule Take 1 capsule by mouth daily.    [provider]  albuterol (VENTOLIN HFA) 108 (90 Base) MCG/ACT inhaler Inhale 2 puffs into the lungs every 4 (four) hours as needed for wheezing or shortness of breath. 01/16/20   Julian Hy, DO  amLODipine (NORVASC) 5 MG tablet Take 7.5 mg by mouth daily. 07/04/17   [provider]  ARIPiprazole (ABILIFY) 10 MG tablet Take 10 mg by mouth daily.    [provider]  aspirin 81 MG EC tablet Take 81 mg by mouth daily.     [provider]  busPIRone (BUSPAR) 30 MG tablet Take 30 mg by mouth 2 (two) times daily.    [provider]  cetirizine (ZYRTEC) 10 MG tablet Take 10 mg by mouth daily as needed for allergies or rhinitis. 03/03/22   [provider]  escitalopram (LEXAPRO) 20 MG tablet Take 30 mg by mouth daily.    [provider]  famotidine (PEPCID) 40 MG tablet Take 40 mg by mouth at bedtime. 11/12/20   [provider]  fenofibrate micronized (LOFIBRA) 134 MG capsule Take 134 mg by mouth daily before breakfast.     [provider]  fluticasone (FLONASE) 50 MCG/ACT nasal spray Place 1 spray into both nostrils  daily as needed for allergies or rhinitis.    [provider]  Fluticasone-Umeclidin-Vilant (TRELEGY ELLIPTA) 100-62.5-25 MCG/ACT AEPB Inhale 1 puff into the lungs daily. 01/29/22   [provider]  furosemide (LASIX) 40 MG tablet Take 1 tablet (40 mg total) by mouth daily. May take additional 40 mg (one tablet) as needed for swelling, weight gain or sob 11/19/20 06/17/22  Dahal, Marlowe Aschoff, MD  gabapentin (NEURONTIN) 800 MG tablet Take 800 mg by mouth 3 (three) times daily.  06/23/17   [provider]  levETIRAcetam (KEPPRA) 500 MG tablet Take 1 tablet (500 mg total) by mouth 2 (two) times daily. Patient taking differently: Take 500 mg by mouth daily. 04/18/14   Kelvin Cellar, MD  levothyroxine (SYNTHROID, LEVOTHROID) 25 MCG tablet Take 25 mcg by mouth daily before breakfast.  06/09/17   [provider]  loperamide (IMODIUM) 2 MG capsule Take 12 mg by mouth as needed for diarrhea or loose stools.    [provider]  losartan (COZAAR) 100 MG tablet Take 100 mg by mouth daily.  07/20/17   [provider]  Menthol, Topical Analgesic, (BIOFREEZE) 10 % CREA Apply 1 Application topically daily as needed (pain).    [provider]  Multiple Vitamins-Minerals (CENTRUM SILVER 50+WOMEN) TABS Take 1 tablet by mouth daily.     [provider]  OXYGEN Inhale 3 L into the lungs at bedtime.    [provider]  pantoprazole (PROTONIX) 40 MG tablet Take 40 mg by mouth every morning.  08/16/13   [provider]  pramipexole (MIRAPEX) 0.5 MG tablet Take 0.5 mg by mouth at bedtime. 04/16/22   [provider]  traMADol (ULTRAM) 50 MG tablet Take 1-2 tablets (50-100 mg total) by mouth every 6 (six) hours as needed for moderate pain or severe pain. 01/16/22   Leighton Ruff, MD  umeclidinium-vilanterol [facility] ELLIPTA) 62.5-25 MCG/INH AEPB Inhale 1 puff into the lungs daily. Patient not taking: Reported on 06/01/2022 01/16/20   Noemi Chapel P, DO  Vibegron (GEMTESA) 75 MG TABS Take 75 mg by mouth daily. 05/27/22   Stoioff, Ronda Fairly, MD      VITAL SIGNS:  Blood pressure (!) 101/56, pulse 69, temperature (!) 102.8 F (39.3 C), temperature source Oral, resp. rate 16, height '5\' 3"'$  (1.6 m), weight 75 kg, SpO2 100 %.  PHYSICAL EXAMINATION:  Physical Exam  GENERAL:  66 y.o.-year-old patient lying in the bed on BiPAP with mild respiratory distress with conversational dyspnea. EYES: Pupils equal, round, reactive to light and accommodation. No scleral icterus. Extraocular muscles intact.  HEENT: Head atraumatic, normocephalic. Oropharynx and nasopharynx clear.  NECK:  Supple, no jugular venous distention. No thyroid enlargement, no tenderness.  LUNGS: Diminished right basal breath stands with right basal crackles.   CARDIOVASCULAR: Regular rate and rhythm, S1, S2 normal. No murmurs, rubs, or gallops.  ABDOMEN: Soft, nondistended, nontender. Bowel sounds present. No organomegaly or mass.  EXTREMITIES: No pedal edema, cyanosis, or clubbing.  NEUROLOGIC: Cranial nerves II through XII are intact. Muscle strength 5/5 in all extremities. Sensation intact. Gait not checked.  PSYCHIATRIC: The patient is alert and oriented x 3.  Normal affect and good eye contact. SKIN: No obvious rash, lesion, or ulcer.    LABORATORY PANEL:   CBC Recent Labs  Lab 07/06/22 1110  WBC 11.5*  HGB 12.3  HCT 38.2  PLT 204   ------------------------------------------------------------------------------------------------------------------  Chemistries  Recent Labs  Lab 07/06/22 1110  NA 133*  K 4.2  CL 99  CO2 27  GLUCOSE 125*  BUN 20  CREATININE 1.54*  CALCIUM 8.8*  AST 23  ALT 14  ALKPHOS 26*  BILITOT 1.4*   ------------------------------------------------------------------------------------------------------------------  Cardiac Enzymes No results for input(s): "TROPONINI" in the last 168 hours. ------------------------------------------------------------------------------------------------------------------  RADIOLOGY:  DG Chest Port 1 View  Result Date: 07/06/2022 CLINICAL DATA:  Questionable sepsis EXAM: PORTABLE CHEST 1 VIEW COMPARISON:  12/16/2021 FINDINGS: Normal cardiac silhouette. There are streaky basilar opacities in the RIGHT lower lobe. No focal consolidation. No pleural fluid. No pneumothorax. IMPRESSION: Streaky basilar opacities in the RIGHT lower lobe is concerning for pneumonia. Followup PA and lateral chest X-ray is recommended in 3-4 weeks following trial of antibiotic therapy to ensure resolution and exclude underlying malignancy. Electronically Signed   By: Suzy Bouchard M.D.   On: 07/06/2022 11:58      IMPRESSION AND PLAN:  Assessment and Plan: * Acute on chronic respiratory failure with hypoxia (HCC) - This is clearly secondary to severe COPD exacerbation like secondary to her right-sided [facility] pneumonia. - The patient will be continued on BiPAP. - O2 protocol will be followed. - Management otherwise as below.  Sepsis due to pneumonia [facility]) - The patient will be continued on hydration with IV normal saline with bolus followed by maintenance infusion. - Sepsis is manifested by fever, tachycardia and tachypnea in the setting of pneumonia. - Should she  continued to be hypotensive she may need IV pressor therapy and ICU admission. - We will continue antibiotic therapy with IV Rocephin and Zithromax. - Mucolytic therapy will be provided. - We will follow blood and sputum cultures. - We will obtain pneumonia antigens. - We will monitor her closely in PCU.  Severe sepsis ([place]) - This is manifested by associated hypotension and hypoxemia in addition to acute kidney injury - Antihypertensives will be held off. - The patient will be hydrated with IV normal saline and will follow BMP. - Management otherwise as above.  COPD with acute exacerbation ([place]) - We will continue steroid therapy with IV Solu-Medrol. - Bronchodilator therapy will be provided with DuoNebs 4 times daily and every 4 hours as needed. - Mucolytic therapy will be provided. - Long-acting beta agonist will be held off.  Hypothyroidism, unspecified - We will continue Synthroid.  Type 2 diabetes mellitus with peripheral neuropathy (HCC) - The patient will be placed on supplement coverage with NovoLog. - We will continue her Neurontin.       DVT prophylaxis: Lovenox.  Advanced Care Planning:  Code Status: The patient is DNR/DNI.  This was discussed with her Family Communication:  The plan of care was discussed in details with the patient (and family). I answered all questions. The patient agreed to proceed with the above mentioned plan. Further management will depend upon hospital course. Disposition Plan: Back to previous home environment Consults called: none. All the records are reviewed and case discussed with ED provider.  Status is: Inpatient    At the time of the admission, it appears that the appropriate admission status for this patient is inpatient.  This is judged to be reasonable and necessary in order to provide the required intensity of service to ensure the patient's safety given the presenting symptoms, physical exam findings and initial radiographic  and laboratory data in the context of comorbid conditions.  The patient requires inpatient status due to high intensity of service, high risk of further deterioration and high frequency of surveillance required.  I certify that at the time of admission, it is  my clinical judgment that the patient will require inpatient hospital care extending more than 2 midnights.                            Dispo: The patient is from: Home              Anticipated d/c is to: Home              Patient currently is not medically stable to d/c.              Difficult to place patient: No  Authorized and performed by: Eugenie Norrie, MD Total critical care time: Approximately   60    minutes. Due to a high probability of clinically significant, life-threatening deterioration, the patient required my highest level of preparedness to intervene emergently and I personally spent this critical care time directly and personally managing the patient.  This critical care time included obtaining a history, examining the patient, pulse oximetry, ordering and review of studies, arranging urgent treatment with development of management plan, evaluation of patient's response to treatment, frequent reassessment, and discussions with other providers. This critical care time was performed to assess and manage the high probability of imminent, life-threatening deterioration that could result in multiorgan failure.  It was exclusive of separately billable procedures and treating other patients and teaching time.   Christel Mormon M.D on 07/06/2022 at 1:57 PM  [facility]   From 7 PM-7 AM, contact night-coverage [email or web address]  CC: Primary care physician; Entzminger, Mendel Corning, MD

## 2022-07-06 NOTE — ED Triage Notes (Signed)
Blood sugar 123 per ems

## 2022-07-06 NOTE — Progress Notes (Signed)
CODE SEPSIS - PHARMACY COMMUNICATION  **Broad Spectrum Antibiotics should be administered within 1 hour of Sepsis diagnosis**  Time Code Sepsis Called/Page Received: 1132  Antibiotics Ordered: cefepime 2 grams x 1, azithromycin 500 mg x 1  Time of 1st antibiotic administration: 6283  Additional action taken by pharmacy: None  If necessary, Name of Provider/Nurse Contacted: [place] ,PharmD Clinical Pharmacist  07/06/2022  11:24 AM

## 2022-07-06 NOTE — Assessment & Plan Note (Signed)
-   The patient will be placed on supplement coverage with NovoLog. - We will continue her Neurontin.

## 2022-07-06 NOTE — Progress Notes (Signed)
Notified provider of need to order vasopressors.  

## 2022-07-06 NOTE — ED Provider Notes (Signed)
[facility] Provider Note    Event Date/Time   First MD Initiated Contact with Patient 07/06/22 1108     (approximate)   History   Shortness of Breath (Sob , fevwer, tacky, ems administered solumedrol 125, douneb x 1 by ems )   HPI  [name] is a 66 y.o. female who presents to the ER for evaluation of shortness of breath confusion weakness.  Found to be febrile with hypoxia O2 saturation of 70%.  Does have a history of COPD.  Denies any nausea or vomiting.  Has had some cough.  She is status post recent rectal prolapse surgery.  States she has had decreased urine output.     Physical Exam   Triage Vital Signs: ED Triage Vitals  Enc Vitals Group     BP 07/06/22 1101 (!) 112/51     Pulse Rate 07/06/22 1101 96     Resp 07/06/22 1101 17     Temp 07/06/22 1101 (!) 102.8 F (39.3 C)     Temp Source 07/06/22 1101 Oral     SpO2 07/06/22 1101 93 %     Weight 07/06/22 1100 165 lb 5.5 oz (75 kg)     Height 07/06/22 1100 '5\' 3"'$  (1.6 m)     Head Circumference --      Peak Flow --      Pain Score 07/06/22 1100 0     Pain Loc --      Pain Edu? --      Excl. in [place]? --     Most recent vital signs: Vitals:   07/06/22 1422 07/06/22 1511  BP: (!) 98/55 (!) 90/50  Pulse: 78 78  Resp: 17 19  Temp:  98.6 F (37 C)  SpO2: 99% 99%     Constitutional: Alert but critically ill-appearing.  Protecting her airway. Eyes: Conjunctivae are normal.  Head: Atraumatic. Nose: No congestion/rhinnorhea. Mouth/Throat: Mucous membranes are moist.   Neck: Painless ROM.  Cardiovascular:   Mildly tachycardic.  Cap refill 3 seconds  respiratory: Acute respiratory failure with hypoxia requiring nonrebreather mask to maintain sats in low 90s.  Has tachypnea with prolonged expiratory phase diminished breath sounds and faint end expiratory wheeze. Gastrointestinal: Soft and nontender.  Musculoskeletal:  no deformity Neurologic:  MAE spontaneously. No gross focal  neurologic deficits are appreciated.  Skin:  Skin is warm, dry and intact. No rash noted. Psychiatric: Mood and affect are normal. Speech and behavior are normal.    ED Results / Procedures / Treatments   Labs (all labs ordered are listed, but only abnormal results are displayed) Labs Reviewed  BLOOD GAS, VENOUS - Abnormal; Notable for the following components:      Result Value   pO2, Ven 59 (*)    Bicarbonate 29.1 (*)    Acid-Base Excess 3.5 (*)    All other components within normal limits  COMPREHENSIVE METABOLIC PANEL - Abnormal; Notable for the following components:   Sodium 133 (*)    Glucose, Bld 125 (*)    Creatinine, Ser 1.54 (*)    Calcium 8.8 (*)    Total Protein 6.4 (*)    Albumin 3.0 (*)    Alkaline Phosphatase 26 (*)    Total Bilirubin 1.4 (*)    GFR, Estimated 37 (*)    All other components within normal limits  CBC WITH DIFFERENTIAL/PLATELET - Abnormal; Notable for the following components:   WBC 11.5 (*)    Neutro Abs 10.8 (*)  Lymphs Abs 0.4 (*)    All other components within normal limits  PROTIME-INR - Abnormal; Notable for the following components:   Prothrombin Time 15.9 (*)    INR 1.3 (*)    All other components within normal limits  APTT - Abnormal; Notable for the following components:   aPTT 37 (*)    All other components within normal limits  RESP PANEL BY RT-PCR (FLU A&B, COVID) ARPGX2  CULTURE, BLOOD (ROUTINE X 2)  CULTURE, BLOOD (ROUTINE X 2)  LACTIC ACID, PLASMA  LACTIC ACID, PLASMA  URINALYSIS, COMPLETE (UACMP) WITH MICROSCOPIC  HIV ANTIBODY (ROUTINE TESTING W REFLEX)     EKG  ED ECG REPORT I, Merlyn Lot, the attending physician, personally viewed and interpreted this ECG.   Date: 07/06/2022  EKG Time: 11:02  Rate: 95  Rhythm: sinus  Axis: normal  Intervals: normal  ST&T Change: no stemi, no depressions    RADIOLOGY Please see ED Course for my review and interpretation.  I personally reviewed all radiographic  images ordered to evaluate for the above acute complaints and reviewed radiology reports and findings.  These findings were personally discussed with the patient.  Please see medical record for radiology report.    PROCEDURES:  Critical Care performed: Yes, see critical care procedure note(s)  .Critical Care  Performed by: Merlyn Lot, MD Authorized by: Merlyn Lot, MD   Critical care provider statement:    Critical care time (minutes):  45   Critical care was necessary to treat or prevent imminent or life-threatening deterioration of the following conditions:  Respiratory failure   Critical care was time spent personally by me on the following activities:  Ordering and performing treatments and interventions, ordering and review of laboratory studies, ordering and review of radiographic studies, pulse oximetry, re-evaluation of patient's condition, review of old charts, obtaining history from patient or surrogate, examination of patient, evaluation of patient's response to treatment, discussions with primary provider, discussions with consultants and development of treatment plan with patient or surrogate    MEDICATIONS ORDERED IN ED: Medications  aspirin EC tablet 81 mg (has no administration in time range)  oxyCODONE (Oxy IR/ROXICODONE) immediate release tablet 5 mg (has no administration in time range)  traMADol (ULTRAM) tablet 50-100 mg (has no administration in time range)  amLODipine (NORVASC) tablet 7.5 mg (has no administration in time range)  fenofibrate tablet 160 mg (has no administration in time range)  ARIPiprazole (ABILIFY) tablet 10 mg (has no administration in time range)  busPIRone (BUSPAR) tablet 30 mg (has no administration in time range)  escitalopram (LEXAPRO) tablet 30 mg (30 mg Oral Given 07/06/22 1524)  hydrOXYzine (ATARAX) tablet 25 mg (has no administration in time range)  levothyroxine (SYNTHROID) tablet 25 mcg (has no administration in time range)   acidophilus (RISAQUAD) capsule 1 capsule (has no administration in time range)  famotidine (PEPCID) tablet 20 mg (has no administration in time range)  loperamide (IMODIUM) capsule 12 mg (has no administration in time range)  pantoprazole (PROTONIX) EC tablet 40 mg (has no administration in time range)  mirabegron ER (MYRBETRIQ) tablet 25 mg (has no administration in time range)  gabapentin (NEURONTIN) capsule 800 mg (has no administration in time range)  levETIRAcetam (KEPPRA) tablet 500 mg (500 mg Oral Given 07/06/22 1524)  multivitamin with minerals tablet 1 tablet (has no administration in time range)  pramipexole (MIRAPEX) tablet 0.5 mg (has no administration in time range)  loratadine (CLARITIN) tablet 10 mg (has no administration in time range)  fluticasone (FLONASE) 50 MCG/ACT nasal spray 1 spray (has no administration in time range)  methylPREDNISolone sodium succinate (SOLU-MEDROL) 125 mg/2 mL injection 80 mg (80 mg Intravenous Given 07/06/22 1420)    Followed by  predniSONE (DELTASONE) tablet 40 mg (has no administration in time range)  ipratropium-albuterol (DUONEB) 0.5-2.5 (3) MG/3ML nebulizer solution 3 mL (3 mLs Nebulization Not Given 07/06/22 1512)  enoxaparin (LOVENOX) injection 40 mg (has no administration in time range)  0.9 %  sodium chloride infusion ( Intravenous New Bag/Given 07/06/22 1414)  cefTRIAXone (ROCEPHIN) 2 g in sodium chloride 0.9 % 100 mL IVPB (has no administration in time range)  azithromycin (ZITHROMAX) 500 mg in sodium chloride 0.9 % 250 mL IVPB (has no administration in time range)  acetaminophen (TYLENOL) tablet 650 mg (has no administration in time range)    Or  acetaminophen (TYLENOL) suppository 650 mg (has no administration in time range)  traZODone (DESYREL) tablet 25 mg (has no administration in time range)  magnesium hydroxide (MILK OF MAGNESIA) suspension 30 mL (has no administration in time range)  ondansetron (ZOFRAN) tablet 4 mg (has no  administration in time range)    Or  ondansetron (ZOFRAN) injection 4 mg (has no administration in time range)  ceFEPIme (MAXIPIME) 2 g in sodium chloride 0.9 % 100 mL IVPB (0 g Intravenous Stopped 07/06/22 1309)  azithromycin (ZITHROMAX) 500 mg in sodium chloride 0.9 % 250 mL IVPB (0 mg Intravenous Stopped 07/06/22 1310)  ipratropium-albuterol (DUONEB) 0.5-2.5 (3) MG/3ML nebulizer solution 3 mL (3 mLs Nebulization Given 07/06/22 1134)  ipratropium-albuterol (DUONEB) 0.5-2.5 (3) MG/3ML nebulizer solution 3 mL (3 mLs Nebulization Given 07/06/22 1133)  acetaminophen (TYLENOL) tablet 650 mg (650 mg Oral Given 07/06/22 1135)  sodium chloride 0.9 % bolus 1,000 mL (0 mLs Intravenous Stopped 07/06/22 1414)  sodium chloride 0.9 % bolus 1,000 mL (0 mLs Intravenous Stopped 07/06/22 1414)    And  sodium chloride 0.9 % bolus 1,000 mL (0 mLs Intravenous Stopped 07/06/22 1414)    And  sodium chloride 0.9 % bolus 500 mL (0 mLs Intravenous Stopped 07/06/22 1414)     IMPRESSION / MDM / ASSESSMENT AND PLAN / ED COURSE  I reviewed the triage vital signs and the nursing notes.                              Differential diagnosis includes, but is not limited to, Asthma, copd, CHF, pna, ptx, malignancy, Pe, anemia   She presented to the ER for evaluation of symptoms as described above.  This presenting complaint could reflect a potentially life-threatening illness therefore the patient will be placed on continuous pulse oximetry and telemetry for monitoring.  Laboratory evaluation will be sent to evaluate for the above complaints.   With evidence of acute respiratory failure with hypoxia she is febrile concerning for sepsis she is protecting her airway.   Clinical Course as of 07/06/22 1601  Tue Jul 06, 2022  1140 Chest x-ray on my review and interpretation Shows evidence of probable developing right lower lobe infiltrate will await formal radiology report. [PR]  7412 Given her respiratory distress history of COPD  fever tachycardia presentation concerning for sepsis I have ordered broad-spectrum antibiotics.  Patient placed on BiPAP for respiratory support. [PR]  1303 COVID is negative.  Patient reassessed and significantly improved.  Work of breathing cynically improved.  I have consulted with hospitalist who agrees to admit patient to their service. [PR]  Clinical Course User Index [PR] Merlyn Lot, MD      FINAL CLINICAL IMPRESSION(S) / ED DIAGNOSES   Final diagnoses:  Sepsis with acute hypoxic respiratory failure, due to unspecified organism, unspecified whether septic shock present ([place])  COPD exacerbation ([place])  Acute respiratory failure with hypoxia ([place])     Rx / DC Orders   ED Discharge Orders     None        Note:  This document was prepared using Dragon voice recognition software and may include unintentional dictation errors.    Merlyn Lot, MD 07/06/22 [phone]

## 2022-07-06 NOTE — Assessment & Plan Note (Signed)
-   We will continue Synthroid. 

## 2022-07-06 NOTE — Assessment & Plan Note (Signed)
-   This is clearly secondary to severe COPD exacerbation like secondary to her right-sided [employer] pneumonia. - The patient will be continued on BiPAP. - O2 protocol will be followed. - Management otherwise as below.

## 2022-07-06 NOTE — ED Notes (Signed)
Patient resting in bed, slumped to right side with eyes closed. Resp even and unlabored on 3L per [place] with oxygen saturation 94%. Opens eyes to verbal stimuli. Oriented to name place time and situation. Follows commands and moves all extremities. Falls asleep shortly after interaction. Sinus bradycardia on monitor at 50's. Denies chest pain. States breathing has improved drastically since first coming into ED. Denies needs at this time.

## 2022-07-06 NOTE — Progress Notes (Signed)
Elink following code sepsis °

## 2022-07-06 NOTE — ED Notes (Signed)
Called pharmacy , midodrine is out in [place]

## 2022-07-06 NOTE — Assessment & Plan Note (Addendum)
-   This is manifested by associated hypotension and hypoxemia in addition to acute kidney injury - Antihypertensives will be held off. - The patient will be hydrated with IV normal saline and will follow BMP. - Management otherwise as above.

## 2022-07-06 NOTE — Assessment & Plan Note (Addendum)
-   The patient will be continued on hydration with IV normal saline with bolus followed by maintenance infusion. - Sepsis is manifested by fever, tachycardia and tachypnea in the setting of pneumonia. - Should she continued to be hypotensive she may need IV pressor therapy and ICU admission. - We will continue antibiotic therapy with IV Rocephin and Zithromax. - Mucolytic therapy will be provided. - We will follow blood and sputum cultures. - We will obtain pneumonia antigens. - We will monitor her closely in PCU.

## 2022-07-06 NOTE — ED Triage Notes (Signed)
Sob, fever, tacky, soulmedrol and douneb by ems, pt unable to give ems a clear picture on her complaints , per ems pt states rectal prolapse 2 weeks prior , pt lives at home

## 2022-07-06 NOTE — Assessment & Plan Note (Addendum)
-   We will continue steroid therapy with IV Solu-Medrol. - Bronchodilator therapy will be provided with DuoNebs 4 times daily and every 4 hours as needed. - Mucolytic therapy will be provided. - Long-acting beta agonist will be held off.

## 2022-07-06 NOTE — Progress Notes (Signed)
PHARMACY -  BRIEF ANTIBIOTIC NOTE   Pharmacy has received consult(s) for cefepime from an ED provider.  The patient's profile has been reviewed for ht/wt/allergies/indication/available labs.    One time order(s) placed for cefepime 2 grams x 1  Further antibiotics/pharmacy consults should be ordered by admitting physician if indicated.                       Thank you, Wynelle Cleveland 07/06/2022  11:23 AM

## 2022-07-07 ENCOUNTER — Encounter: Payer: Self-pay | Admitting: Family Medicine

## 2022-07-07 ENCOUNTER — Other Ambulatory Visit: Payer: Self-pay

## 2022-07-07 DIAGNOSIS — J441 Chronic obstructive pulmonary disease with (acute) exacerbation: Secondary | ICD-10-CM | POA: Diagnosis not present

## 2022-07-07 DIAGNOSIS — J9621 Acute and chronic respiratory failure with hypoxia: Secondary | ICD-10-CM | POA: Diagnosis not present

## 2022-07-07 DIAGNOSIS — N179 Acute kidney failure, unspecified: Secondary | ICD-10-CM

## 2022-07-07 LAB — CBC
HCT: 37.8 % (ref 36.0–46.0)
Hemoglobin: 12 g/dL (ref 12.0–15.0)
MCH: 30.9 pg (ref 26.0–34.0)
MCHC: 31.7 g/dL (ref 30.0–36.0)
MCV: 97.4 fL (ref 80.0–100.0)
Platelets: 197 10*3/uL (ref 150–400)
RBC: 3.88 MIL/uL (ref 3.87–5.11)
RDW: 13.9 % (ref 11.5–15.5)
WBC: 10.2 10*3/uL (ref 4.0–10.5)
nRBC: 0 % (ref 0.0–0.2)

## 2022-07-07 LAB — BASIC METABOLIC PANEL
Anion gap: 6 (ref 5–15)
BUN: 22 mg/dL (ref 8–23)
CO2: 24 mmol/L (ref 22–32)
Calcium: 8.5 mg/dL — ABNORMAL LOW (ref 8.9–10.3)
Chloride: 107 mmol/L (ref 98–111)
Creatinine, Ser: 1.12 mg/dL — ABNORMAL HIGH (ref 0.44–1.00)
GFR, Estimated: 55 mL/min — ABNORMAL LOW (ref >60)
Glucose, Bld: 135 mg/dL — ABNORMAL HIGH (ref 70–99)
Potassium: 4.4 mmol/L (ref 3.5–5.1)
Sodium: 137 mmol/L (ref 135–145)

## 2022-07-07 LAB — CBG MONITORING, ED
Glucose-Capillary: 166 mg/dL — ABNORMAL HIGH (ref 70–99)
Glucose-Capillary: 138 mg/dL — ABNORMAL HIGH (ref 70–99)
Glucose-Capillary: 244 mg/dL — ABNORMAL HIGH (ref 70–99)

## 2022-07-07 LAB — PROCALCITONIN: Procalcitonin: 14.02 ng/mL

## 2022-07-07 LAB — CORTISOL-AM, BLOOD: Cortisol - AM: 9.6 ug/dL (ref 6.7–22.6)

## 2022-07-07 LAB — PROTIME-INR
INR: 1.3 — ABNORMAL HIGH (ref 0.8–1.2)
Prothrombin Time: 16 seconds — ABNORMAL HIGH (ref 11.4–15.2)

## 2022-07-07 LAB — HIV ANTIBODY (ROUTINE TESTING W REFLEX): HIV Screen 4th Generation wRfx: NONREACTIVE

## 2022-07-07 MED ORDER — INSULIN ASPART 100 UNIT/ML IJ SOLN
0.0000 [IU] | Freq: Three times a day (TID) | INTRAMUSCULAR | Status: DC
  Administered 2022-07-07: 1 [IU] via SUBCUTANEOUS
  Administered 2022-07-08: 2 [IU] via SUBCUTANEOUS
  Administered 2022-07-09: 1 [IU] via SUBCUTANEOUS
  Filled 2022-07-07 (×3): qty 1

## 2022-07-07 NOTE — Progress Notes (Signed)
Admission profile udpated ?

## 2022-07-07 NOTE — Evaluation (Signed)
Physical Therapy Evaluation Patient Details Name: [name] MRN: [id number] DOB: 1956/06/06 Today's Date: 07/07/2022  History of Present Illness  [name] is a 66 y.o. female with medical history significant for heart failure with preserved ejection fraction, COPD on home O2 at 3 L/min, ongoing tobacco abuse, GERD, hypertension, hypothyroidism, dyslipidemia, seizure disorder and anxiety, who presented to the emergency room with acute onset of worsening dyspnea with associated cough that is mainly dry for the last 3 days.  She developed a fever today as well and has hypoxia on her home O2. s/p rectal prolapse surgery ~2 weeks prior.   Clinical Impression  Pt admitted with above diagnosis. Pt received upright in bed about to be transferred from ED cot to hospital bed in current, new room. Pt reports mod-I at home with [facility], still driving and performs short community distances. Denies falls or difficulty with gait since previous hospitalization.  Pt indep exiting bed with min use of bed features and standing independently. Once standing reports urinating and passing BM. Intermittent HHA+1 for steadying due to need for LE's BOS to prevent dampening socks. Dependent from NT for pericare. Pt on 3L/mi throughout at 90% with standing ~5 minutes. Post pericare pt requiring seated rest break due to minor dizziness. After 2 min seated rest, pt standing minguard to RW ambulating in room ~20'. Overall steady but demonstrating slowed gait speed. Pt returning to recliner desatting to 89% requiring short seated rest to return to > 90%. All needs in reach. Rec HHPT at d/c to maximize capacity for standing and walking tasks. Pt currently with functional limitations due to the deficits listed below (see PT Problem List). Pt will benefit from skilled PT to increase their independence and safety with mobility to allow discharge to the venue listed below.         Recommendations for follow up therapy are one component  of a multi-disciplinary discharge planning process, led by the attending physician.  Recommendations may be updated based on patient status, additional functional criteria and insurance authorization.  Follow Up Recommendations Home health PT      Assistance Recommended at Discharge Intermittent Supervision/Assistance  Patient can return home with the following  A little help with bathing/dressing/bathroom;Assistance with cooking/housework;Assist for transportation;Help with stairs or ramp for entrance;A little help with walking and/or transfers    Equipment Recommendations Rolling walker (2 wheels)  Recommendations for Other Services       Functional Status Assessment Patient has had a recent decline in their functional status and demonstrates the ability to make significant improvements in function in a reasonable and predictable amount of time.     Precautions / Restrictions Precautions Precautions: Fall Restrictions Weight Bearing Restrictions: No      Mobility  Bed Mobility Overal bed mobility: Modified Independent             General bed mobility comments: use of bed features Patient Response: Cooperative  Transfers Overall transfer level: Needs assistance Equipment used: None Transfers: Sit to/from Stand Sit to Stand: Supervision                Ambulation/Gait Ambulation/Gait assistance: Min guard Gait Distance (Feet): 25 Feet Assistive device: Rolling walker (2 wheels) Gait Pattern/deviations: Step-through pattern, Decreased step length - right, Decreased step length - left, Trunk flexed       General Gait Details: Able to stand with HHA+1, amb 20' with RW, Overall steady.  Stairs  Wheelchair Mobility    Modified Rankin (Stroke Patients Only)       Balance Overall balance assessment: Needs assistance Sitting-balance support: Feet supported, No upper extremity supported Sitting balance-Leahy Scale: Good     Standing  balance support: No upper extremity supported Standing balance-Leahy Scale: Fair Standing balance comment: Intermittent, light HHA+1                             Pertinent Vitals/Pain Pain Assessment Pain Assessment: No/denies pain    Home Living Family/patient expects to be discharged to:: Private residence Living Arrangements: Spouse/significant other Available Help at Discharge: Family;Available 24 hours/day Type of Home: Mobile home Home Access: Ramped entrance       Home Layout: One level Home Equipment: [place] - single point;Shower seat;BSC/3in1      Prior Function Prior Level of Function : Independent/Modified Independent             Mobility Comments: use of cane as needed       Hand Dominance   Dominant Hand: Right    Extremity/Trunk Assessment   Upper Extremity Assessment Upper Extremity Assessment: Overall WFL for tasks assessed    Lower Extremity Assessment Lower Extremity Assessment: Generalized weakness    Cervical / Trunk Assessment Cervical / Trunk Assessment: Normal  Communication   Communication: No difficulties  Cognition Arousal/Alertness: Awake/alert Behavior During Therapy: WFL for tasks assessed/performed Overall Cognitive Status: Within Functional Limits for tasks assessed                                          General Comments General comments (skin integrity, edema, etc.): Desat to 89% on 3L/min with gait. Quick recovery via seated rest    Exercises Other Exercises Other Exercises: Role of PT in acute setting, d/c recs, PLB   Assessment/Plan    PT Assessment Patient needs continued PT services  PT Problem List Decreased activity tolerance;Decreased balance;Decreased mobility;Decreased knowledge of use of DME       PT Treatment Interventions DME instruction;Gait training;Functional mobility training;Therapeutic activities;Therapeutic exercise;Patient/family education;Balance  training;Neuromuscular re-education    PT Goals (Current goals can be found in the Care Plan section)  Acute Rehab PT Goals Patient Stated Goal: go home PT Goal Formulation: With patient Time For Goal Achievement: 07/21/22 Potential to Achieve Goals: Good    Frequency Min 2X/week     Co-evaluation               AM-PAC PT "6 Clicks" Mobility  Outcome Measure Help needed turning from your back to your side while in a flat bed without using bedrails?: None Help needed moving from lying on your back to sitting on the side of a flat bed without using bedrails?: None Help needed moving to and from a bed to a chair (including a wheelchair)?: A Little Help needed standing up from a chair using your arms (e.g., wheelchair or bedside chair)?: A Little Help needed to walk in hospital room?: A Little Help needed climbing 3-5 steps with a railing? : A Lot 6 Click Score: 19    End of Session Equipment Utilized During Treatment: Gait belt;Oxygen Activity Tolerance: Patient tolerated treatment well;Patient limited by fatigue Patient left: in chair;with bed alarm set Nurse Communication: Mobility status PT Visit Diagnosis: Unsteadiness on feet (R26.81);Difficulty in walking, not elsewhere classified (R26.2);Muscle weakness (generalized) (M62.81)  Time: 2417-5301 PT Time Calculation (min) (ACUTE ONLY): 19 min   Charges:   PT Evaluation $PT Eval Low Complexity: Franklin M. Fairly IV, PT, DPT Physical Therapist- [facility]  07/07/2022, 10:47 AM

## 2022-07-07 NOTE — Evaluation (Signed)
Occupational Therapy Evaluation Patient Details Name: [name] MRN: [id number] DOB: 1956/03/25 Today's Date: 07/07/2022   History of Present Illness Pt is a 66 year old female admitted with acute on chronic respiratory failure with hypoxia, sepsis due to pneumonia, COPD with acute exacerbation after presenting to the ED after cute onset of worsening dyspnea with associated cough that is mainly dry for the last 3 days; PMH significant for heart  failure with preserved ejection fraction, COPD on home O2 at 3 L/min, ongoing tobacco abuse, GERD, hypertension, hypothyroidism, dyslipidemia, seizure disorder and anxiety. 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 Pt presents with deficits in activity tolerance, endurance affecting safe and optimal ADL completion. Please see further details below. Recommend discharge home with home health OT, per chart pt has reported she has bsc, however reports to thsi therapist she does not. Recommend 3 in1 BSC following discharge. Pt is left in bedside chair, NAD, all needs met. OT will follow acutely.      Recommendations for follow up therapy are one component of a multi-disciplinary discharge planning process, led by the attending physician.  Recommendations may be updated based on patient status, additional functional criteria and insurance authorization.   Follow Up Recommendations  Home health OT    Assistance Recommended at Discharge Frequent or constant Supervision/Assistance  Patient can return home with the following A little help with walking and/or transfers;A little help with bathing/dressing/bathroom;Assistance with cooking/housework;Assist for transportation;Help with stairs or ramp for entrance    Functional Status Assessment  Patient has had a recent  decline in their functional status and demonstrates the ability to make significant improvements in function in a reasonable and predictable amount of time.  Equipment Recommendations  BSC/3in1    Recommendations for Other Services       Precautions / Restrictions Precautions Precautions: Fall Restrictions Weight Bearing Restrictions: No      Mobility Bed Mobility               General bed mobility comments: NT pt in recliner pre/post session    Transfers Overall transfer level: Needs assistance Equipment used: Rolling walker (2 wheels) Transfers: Sit to/from Stand Sit to Stand: Supervision                  Balance Overall balance assessment: Needs assistance Sitting-balance support: Feet supported, No upper extremity supported Sitting balance-Leahy Scale: Good     Standing balance support: No upper extremity supported Standing balance-Leahy Scale: Fair                             ADL either performed or assessed with clinical judgement   ADL                                         General ADL Comments: toileting with MOD A for thoroughness, grooming tasks with SET UP     Vision Patient Visual Report: No change from baseline       Perception     Praxis      Pertinent Vitals/Pain Pain Assessment Pain Assessment: No/denies pain     Hand Dominance Right   Extremity/Trunk Assessment  Upper Extremity Assessment Upper Extremity Assessment: Overall WFL for tasks assessed   Lower Extremity Assessment Lower Extremity Assessment: Generalized weakness   Cervical / Trunk Assessment Cervical / Trunk Assessment: Normal   Communication Communication Communication: No difficulties   Cognition Arousal/Alertness: Awake/alert Behavior During Therapy: WFL for tasks assessed/performed Overall Cognitive Status: Within Functional Limits for tasks assessed                                       General  Comments  spo2 >90% throughout mobility    Exercises Other Exercises Other Exercises: edu re: role of OT, role of rehab, discharge planning, home safety, use of DME as needed   Shoulder Instructions      Home Living Family/patient expects to be discharged to:: Private residence Living Arrangements: Spouse/significant other Available Help at Discharge: Family;Available 24 hours/day Type of Home: Mobile home Home Access: Ramped entrance     Home Layout: One level     Bathroom Shower/Tub: Occupational psychologist: Standard Bathroom Accessibility: Yes   Home Equipment: Cane - single point;Shower seat;BSC/3in1          Prior Functioning/Environment Prior Level of Function : Independent/Modified Independent             Mobility Comments: prn use of SPC ADLs Comments: pt is MOD I-I in ADL;fiance assists with cooking/cleaning as needed        OT Problem List: Decreased strength;Decreased activity tolerance;Decreased knowledge of use of DME or AE      OT Treatment/Interventions: Self-care/ADL training;Patient/family education;Therapeutic exercise;Energy conservation;DME and/or AE instruction;Therapeutic activities    OT Goals(Current goals can be found in the care plan section) Acute Rehab OT Goals Patient Stated Goal: go home OT Goal Formulation: With patient Time For Goal Achievement: 07/21/22 ADL Goals Pt Will Perform Lower Body Dressing: with modified independence;sit to/from stand Pt Will Transfer to Toilet: with modified independence;ambulating Pt Will Perform Toileting - Clothing Manipulation and hygiene: with modified independence;sit to/from stand  OT Frequency: Min 2X/week    Co-evaluation              AM-PAC OT "6 Clicks" Daily Activity     Outcome Measure Help from another person eating meals?: None Help from another person taking care of personal grooming?: None Help from another person toileting, which includes using toliet, bedpan, or  urinal?: A Little Help from another person bathing (including washing, rinsing, drying)?: A Little Help from another person to put on and taking off regular upper body clothing?: None Help from another person to put on and taking off regular lower body clothing?: None 6 Click Score: 22   End of Session Equipment Utilized During Treatment: Oxygen Nurse Communication: Mobility status  Activity Tolerance: Patient tolerated treatment well Patient left: in chair;with call bell/phone within reach  OT Visit Diagnosis: Unsteadiness on feet (R26.81)                Time: 1136-1150 OT Time Calculation (min): 14 min Charges:  OT General Charges $OT Visit: 1 Visit OT Evaluation $OT Eval Low Complexity: 1 Low  Shanon Payor, OTD OTR/L  07/07/22, 1:26 PM

## 2022-07-07 NOTE — Progress Notes (Signed)
PROGRESS NOTE    MINSA [name]  DTO:[id number] DOB: [birth year] DOA: 07/06/2022 PCP: Waylan Rocher, MD   Assessment & Plan:   Principal Problem:   Acute on chronic respiratory failure with hypoxia ([place]) Active Problems:   Sepsis due to pneumonia ([place])   Severe sepsis ([place])   COPD with acute exacerbation ([place])   Type 2 diabetes mellitus with peripheral neuropathy (HCC)   Hypothyroidism, unspecified  Assessment and Plan: Acute on chronic hypoxic respiratory failure: secondary to severe COPD exacerbation & CAP. Continue on supplemental oxygen and wean back to baseline as tolerated  Severe sepsis: met criteria w/ fever, tachycardia, tachypnea  & likely pneumonia. Continue on IV rocephin, azithromycin, steroids, bronchodilators & encourage incentive spirometry. Blood cxs are pending. Continue on IVFs   COPD exacerbation: continue on azithromycin, steroids, bronchodilators & encourage incentive spirometry   Hypothyroidism: continue on home dose of levothyroxine    DM2: likely poorly controlled. Will start SSI w/ accuchecks  Peripheral neuropathy: continue on home dose of gabapentin   AKI: unknown baseline Cr/GFR. Cr is trending down today. Avoid nephrotoxic meds      DVT prophylaxis: lovenox  Code Status: DNR Family Communication:  Disposition Plan: likely d/c back home.  Level of care: Progressive  Status is: Inpatient Remains inpatient appropriate because: severity of illness    Consultants:    Procedures:  Antimicrobials: azithromycin, rocephin    Subjective: Pt c/o shortness of breath  Objective: Vitals:   07/07/22 0700 07/07/22 0730 07/07/22 0800 07/07/22 0830  BP: 114/65 107/90 140/70 134/77  Pulse: (!) 50 (!) 49 (!) 48 (!) 55  Resp: $Remo'13 13 13 14  'jpbIN$ Temp:      TempSrc:      SpO2: 95% 98% 96% 94%  Weight:      Height:        Intake/Output Summary (Last 24 hours) at 07/07/2022 0922 Last data filed at 07/07/2022 0543 Gross per 24 hour  Intake  4450 ml  Output 425 ml  Net 4025 ml   Filed Weights   07/06/22 1100  Weight: 75 kg    Examination:  General exam: Appears calm and comfortable  Respiratory system: diminished breath sounds b/l  Cardiovascular system: S1 & S2 +. No rubs, gallops or clicks Gastrointestinal system: Abdomen is nondistended, soft and nontender. Normal bowel sounds heard. Central nervous system: Alert and oriented. Moves all extremities  Psychiatry: Judgement and insight appear normal. Flat mood and affect     Data Reviewed: I have personally reviewed following labs and imaging studies  CBC: Recent Labs  Lab 07/06/22 1110 07/07/22 0535  WBC 11.5* 10.2  NEUTROABS 10.8*  --   HGB 12.3 12.0  HCT 38.2 37.8  MCV 96.5 97.4  PLT 204 983   Basic Metabolic Panel: Recent Labs  Lab 07/06/22 1110 07/07/22 0535  NA 133* 137  K 4.2 4.4  CL 99 107  CO2 27 24  GLUCOSE 125* 135*  BUN 20 22  CREATININE 1.54* 1.12*  CALCIUM 8.8* 8.5*   GFR: Estimated Creatinine Clearance: 48.5 mL/min (A) (by C-G formula based on SCr of 1.12 mg/dL (H)). Liver Function Tests: Recent Labs  Lab 07/06/22 1110  AST 23  ALT 14  ALKPHOS 26*  BILITOT 1.4*  PROT 6.4*  ALBUMIN 3.0*   No results for input(s): "LIPASE", "AMYLASE" in the last 168 hours. No results for input(s): "AMMONIA" in the last 168 hours. Coagulation Profile: Recent Labs  Lab 07/06/22 1110 07/07/22 0535  INR 1.3* 1.3*  Cardiac Enzymes: No results for input(s): "CKTOTAL", "CKMB", "CKMBINDEX", "TROPONINI" in the last 168 hours. BNP (last 3 results) No results for input(s): "PROBNP" in the last 8760 hours. HbA1C: No results for input(s): "HGBA1C" in the last 72 hours. CBG: No results for input(s): "GLUCAP" in the last 168 hours. Lipid Profile: No results for input(s): "CHOL", "HDL", "LDLCALC", "TRIG", "CHOLHDL", "LDLDIRECT" in the last 72 hours. Thyroid Function Tests: No results for input(s): "TSH", "T4TOTAL", "FREET4", "T3FREE",  "THYROIDAB" in the last 72 hours. Anemia Panel: No results for input(s): "VITAMINB12", "FOLATE", "FERRITIN", "TIBC", "IRON", "RETICCTPCT" in the last 72 hours. Sepsis Labs: Recent Labs  Lab 07/06/22 1110 07/06/22 1530 07/07/22 0535  PROCALCITON  --   --  14.02  LATICACIDVEN 0.7 0.6  --     Recent Results (from the past 240 hour(s))  Resp Panel by RT-PCR (Flu A&B, Covid) Anterior Nasal Swab     Status: None   Collection Time: 07/06/22 11:10 AM   Specimen: Anterior Nasal Swab  Result Value Ref Range Status   SARS Coronavirus 2 by RT PCR NEGATIVE NEGATIVE Final    Comment: (NOTE) SARS-CoV-2 target nucleic acids are NOT DETECTED.  The SARS-CoV-2 RNA is generally detectable in upper respiratory specimens during the acute phase of infection. The lowest concentration of SARS-CoV-2 viral copies this assay can detect is 138 copies/mL. A negative result does not preclude SARS-Cov-2 infection and should not be used as the sole basis for treatment or other patient management decisions. A negative result may occur with  improper specimen collection/handling, submission of specimen other than nasopharyngeal swab, presence of viral mutation(s) within the areas targeted by this assay, and inadequate number of viral copies(<138 copies/mL). A negative result must be combined with clinical observations, patient history, and epidemiological information. The expected result is Negative.  Fact Sheet for Patients:  BloggerCourse.com  Fact Sheet for Healthcare Providers:  SeriousBroker.it  This test is no t yet approved or cleared by the [place] FDA and  has been authorized for detection and/or diagnosis of SARS-CoV-2 by FDA under an Emergency Use Authorization (EUA). This EUA will remain  in effect (meaning this test can be used) for the duration of the COVID-19 declaration under Section 564(b)(1) of the Act, 21 U.S.C.section  360bbb-3(b)(1), unless the authorization is terminated  or revoked sooner.       Influenza A by PCR NEGATIVE NEGATIVE Final   Influenza B by PCR NEGATIVE NEGATIVE Final    Comment: (NOTE) The Xpert Xpress SARS-CoV-2/FLU/RSV plus assay is intended as an aid in the diagnosis of influenza from Nasopharyngeal swab specimens and should not be used as a sole basis for treatment. Nasal washings and aspirates are unacceptable for Xpert Xpress SARS-CoV-2/FLU/RSV testing.  Fact Sheet for Patients: BloggerCourse.com  Fact Sheet for Healthcare Providers: SeriousBroker.it  This test is not yet approved or cleared by the [place] FDA and has been authorized for detection and/or diagnosis of SARS-CoV-2 by FDA under an Emergency Use Authorization (EUA). This EUA will remain in effect (meaning this test can be used) for the duration of the COVID-19 declaration under Section 564(b)(1) of the Act, 21 U.S.C. section 360bbb-3(b)(1), unless the authorization is terminated or revoked.  Performed at [facility], [address] [place], [place] [address]   Blood Culture (routine x 2)     Status: None (Preliminary result)   Collection Time: 07/06/22 11:10 AM   Specimen: BLOOD  Result Value Ref Range Status   Specimen Description BLOOD RIGHT AC  Final   Special Requests   Final    BOTTLES DRAWN AEROBIC AND ANAEROBIC Blood Culture adequate volume   Culture   Final    NO GROWTH < 24 HOURS Performed at [facility], [address] [place], [place] [address]    Report Status PENDING  Incomplete  Blood Culture (routine x 2)     Status: None (Preliminary result)   Collection Time: 07/06/22 11:10 AM   Specimen: BLOOD  Result Value Ref Range Status   Specimen Description BLOOD RIGHT FOREARM  Final   Special Requests   Final    BOTTLES DRAWN AEROBIC AND ANAEROBIC Blood Culture adequate volume   Culture   Final    NO GROWTH <  24 HOURS Performed at [facility], [address] [place], [place] [address]    Report Status PENDING  Incomplete         Radiology Studies: DG Chest Port 1 View  Result Date: 07/06/2022 CLINICAL DATA:  Questionable sepsis EXAM: PORTABLE CHEST 1 VIEW COMPARISON:  12/16/2021 FINDINGS: Normal cardiac silhouette. There are streaky basilar opacities in the RIGHT lower lobe. No focal consolidation. No pleural fluid. No pneumothorax. IMPRESSION: Streaky basilar opacities in the RIGHT lower lobe is concerning for pneumonia. Followup PA and lateral chest X-ray is recommended in 3-4 weeks following trial of antibiotic therapy to ensure resolution and exclude underlying malignancy. Electronically Signed   By: Suzy Bouchard M.D.   On: 07/06/2022 11:58        Scheduled Meds:  acidophilus  1 capsule Oral Daily   amLODipine  7.5 mg Oral Daily   ARIPiprazole  10 mg Oral Daily   aspirin EC  81 mg Oral Daily   busPIRone  30 mg Oral BID   enoxaparin (LOVENOX) injection  40 mg Subcutaneous Q24H   escitalopram  30 mg Oral Daily   famotidine  20 mg Oral QHS   fenofibrate  160 mg Oral Daily   gabapentin  800 mg Oral TID   ipratropium-albuterol  3 mL Nebulization QID   levETIRAcetam  500 mg Oral Daily   levothyroxine  25 mcg Oral QAC breakfast   loratadine  10 mg Oral Daily   midodrine  10 mg Oral TID WC   mirabegron ER  25 mg Oral Daily   multivitamin with minerals  1 tablet Oral Daily   pantoprazole  40 mg Oral q morning   pramipexole  0.5 mg Oral QHS   predniSONE  40 mg Oral Q breakfast   Continuous Infusions:  sodium chloride 150 mL/hr at 07/07/22 0252   azithromycin     cefTRIAXone (ROCEPHIN)  IV Stopped (07/07/22 0029)     LOS: 1 day    Time spent: 35 mins     Wyvonnia Dusky, MD [facility] Pager [phone]  If 7PM-7AM, please contact night-coverage [email or web address] 07/07/2022, 9:22 AM

## 2022-07-07 NOTE — ED Notes (Signed)
Patient set up with breakfast tray 

## 2022-07-08 DIAGNOSIS — J441 Chronic obstructive pulmonary disease with (acute) exacerbation: Secondary | ICD-10-CM | POA: Diagnosis not present

## 2022-07-08 DIAGNOSIS — N179 Acute kidney failure, unspecified: Secondary | ICD-10-CM | POA: Diagnosis not present

## 2022-07-08 DIAGNOSIS — J9621 Acute and chronic respiratory failure with hypoxia: Secondary | ICD-10-CM | POA: Diagnosis not present

## 2022-07-08 LAB — BASIC METABOLIC PANEL
Anion gap: 6 (ref 5–15)
BUN: 24 mg/dL — ABNORMAL HIGH (ref 8–23)
CO2: 23 mmol/L (ref 22–32)
Calcium: 9 mg/dL (ref 8.9–10.3)
Chloride: 109 mmol/L (ref 98–111)
Creatinine, Ser: 1.02 mg/dL — ABNORMAL HIGH (ref 0.44–1.00)
GFR, Estimated: >60 mL/min (ref >60)
Glucose, Bld: 144 mg/dL — ABNORMAL HIGH (ref 70–99)
Potassium: 4.6 mmol/L (ref 3.5–5.1)
Sodium: 138 mmol/L (ref 135–145)

## 2022-07-08 LAB — CBC
HCT: 36.1 % (ref 36.0–46.0)
Hemoglobin: 11.7 g/dL — ABNORMAL LOW (ref 12.0–15.0)
MCH: 31.1 pg (ref 26.0–34.0)
MCHC: 32.4 g/dL (ref 30.0–36.0)
MCV: 96 fL (ref 80.0–100.0)
Platelets: 209 10*3/uL (ref 150–400)
RBC: 3.76 MIL/uL — ABNORMAL LOW (ref 3.87–5.11)
RDW: 13.8 % (ref 11.5–15.5)
WBC: 11.1 10*3/uL — ABNORMAL HIGH (ref 4.0–10.5)
nRBC: 0 % (ref 0.0–0.2)

## 2022-07-08 LAB — CBG MONITORING, ED
Glucose-Capillary: 133 mg/dL — ABNORMAL HIGH (ref 70–99)
Glucose-Capillary: 208 mg/dL — ABNORMAL HIGH (ref 70–99)

## 2022-07-08 LAB — GLUCOSE, CAPILLARY
Glucose-Capillary: 138 mg/dL — ABNORMAL HIGH (ref 70–99)
Glucose-Capillary: 267 mg/dL — ABNORMAL HIGH (ref 70–99)

## 2022-07-08 MED ORDER — IPRATROPIUM-ALBUTEROL 0.5-2.5 (3) MG/3ML IN SOLN
3.0000 mL | Freq: Three times a day (TID) | RESPIRATORY_TRACT | Status: DC
  Administered 2022-07-08 – 2022-07-09 (×3): 3 mL via RESPIRATORY_TRACT
  Filled 2022-07-08 (×3): qty 3

## 2022-07-08 MED ORDER — IPRATROPIUM-ALBUTEROL 0.5-2.5 (3) MG/3ML IN SOLN
RESPIRATORY_TRACT | Status: AC
  Administered 2022-07-08: 3 mL via RESPIRATORY_TRACT
  Filled 2022-07-08: qty 3

## 2022-07-08 MED ORDER — IPRATROPIUM-ALBUTEROL 0.5-2.5 (3) MG/3ML IN SOLN: 3.0000 mL | Freq: Three times a day (TID) | RESPIRATORY_TRACT | Status: DC

## 2022-07-08 NOTE — Progress Notes (Signed)
Nutrition Brief Note  RD consulted for assessment of nutritional requirements/ status.   Wt Readings from Last 15 Encounters:  07/06/22 75 kg  06/20/22 88.4 kg  06/17/22 80.7 kg  06/07/22 83.5 kg  05/27/22 83.5 kg  03/10/22 85.1 kg  03/04/22 79.4 kg  10/20/21 79.4 kg  11/19/20 89.9 kg  09/03/20 98 kg  02/05/20 93 kg  01/17/20 89.8 kg  01/16/20 91.6 kg  01/03/20 89.1 kg  01/13/13 77.1 kg   Pt with medical history significant for heart failure with preserved ejection fraction, COPD on home O2 at 3 L/min, ongoing tobacco abuse, GERD, hypertension, hypothyroidism, dyslipidemia, seizure disorder and anxiety, who presented with acute onset of worsening dyspnea with associated cough that is mainly dry for the last 3 days.  Pt admitted with acute on chronic respiratory failure secondary to COPD.   Reviewed I/O's: +2.8 L x 24 hours and +6.9 since admission  Pt sitting in recliner chair, talking on phone at time of visit.   Nutrition-Focused physical exam completed. Findings are no fat depletion, no muscle depletion, and no edema.     Reviewed wt hx; noted pt has experienced a 10.1% wt loss over the past month. Suspect wt loss may be related to fluid losses from CHF.   Medications reviewed and include keppra and prenisone.   Lab Results  Component Value Date   HGBA1C 5.2 06/18/2022   PTA DM medications are none.   Labs reviewed: CBGS: 254-270 (inpatient orders for glycemic control are 0-6 units insulin aspart TID with meals).    Current diet order is heart healthy/ carb modified (liberalized to 2 gram sodium), patient is consuming approximately 100% of meals at this time. Labs and medications reviewed.   No nutrition interventions warranted at this time. If nutrition issues arise, please consult RD.   Loistine Chance, RD, LDN, [place] Registered Dietitian II Certified Diabetes Care and Education Specialist Please refer to [facility] for RD and/or RD on-call/weekend/after hours pager

## 2022-07-08 NOTE — TOC Initial Note (Signed)
Transition of Care [facility]) - Initial/Assessment Note    Patient Details  Name: [name] MRN: [id number] Date of Birth: 12-12-1956  Transition of Care [facility]) CM/SW Contact:    Alberteen Sam, LCSW Phone Number: 07/08/2022, 2:30 PM  Clinical Narrative:                  CSW spoke with patient at bedside regarding [place], patient reports shes in agreement. CSW offered outpatient PT if unable to identify a [place] agency to take patient's insurance, she reports she would declined outpatient PT. Reports she does need a walker and a 3in1, CSW has informed [employer]. Family to transport home at DC. Patient reports [facility] uses [facility] [address] and continues to see PCP Dr. Oris Drone.   CSW has sent [facility] referral to [facility] pending acceptance.  Expected Discharge Plan: [place] Barriers to Discharge: Continued Medical Work up   Patient Goals and CMS Choice Patient states their goals for this hospitalization and ongoing recovery are:: to go home CMS Medicare.gov Compare Post Acute Care list provided to:: Patient Choice offered to / list presented to : Patient  Expected Discharge Plan and Services Expected Discharge Plan: [place]       Living arrangements for the past 2 months: Single Family Home                                      Prior Living Arrangements/Services Living arrangements for the past 2 months: Single Family Home Lives with:: Self                   Activities of Daily Living Home Assistive Devices/Equipment: Cane (specify quad or straight), Dentures (specify type), Oxygen ADL Screening (condition at time of admission) Patient's cognitive ability adequate to safely complete daily activities?: Yes Is the patient deaf or have difficulty hearing?: No Does the patient have difficulty seeing, even when wearing glasses/contacts?: No Does the patient have difficulty concentrating, remembering, or making decisions?: No Patient able to  express need for assistance with ADLs?: Yes Does the patient have difficulty dressing or bathing?: No Independently performs ADLs?: Yes (appropriate for developmental age) Does the patient have difficulty walking or climbing stairs?: No Weakness of Legs: None Weakness of Arms/Hands: None  Permission Sought/Granted                  Emotional Assessment   Attitude/Demeanor/Rapport: Gracious Affect (typically observed): Calm Orientation: : Oriented to Self, Oriented to Place, Oriented to  Time, Oriented to Situation Alcohol / Substance Use: Not Applicable Psych Involvement: No (comment)  Admission diagnosis:  COPD exacerbation ([place]) [J44.1] Acute respiratory failure with hypoxia ([place]) [J96.01] Sepsis due to pneumonia ([place]) [J18.9, A41.9] Sepsis with acute hypoxic respiratory failure, due to unspecified organism, unspecified whether septic shock present ([place]) [A41.9, R65.20, J96.01] Patient Active Problem List   Diagnosis Date Noted   Sepsis due to pneumonia ([place]) 07/06/2022   Severe sepsis ([place]) 07/06/2022   Type 2 diabetes mellitus with peripheral neuropathy ([place]) 07/06/2022   Hypothyroidism, unspecified 07/06/2022   Normocytic anemia 06/19/2022   Complete rectal prolapse 06/18/2022   Chronic respiratory failure with hypoxia ([place]) 06/18/2022   GERD (gastroesophageal reflux disease) 04/06/2022   Restless leg syndrome 04/06/2022   Seizures ([place]) 04/06/2022   Vomiting without nausea 04/06/2022   AKI (acute kidney injury) ([place]) 04/06/2022  Delayed gastric emptying 04/06/2022   Anxiety and depression 01/25/2021   Acute on chronic respiratory failure with hypoxia (HCC) 11/17/2020   Protein calorie malnutrition ([place]) 11/17/2020   Hypothyroidism 11/17/2020   Leg pain 02/05/2020   Myofascial pain syndrome 08/23/2018   Lumbar spondylosis 08/23/2018   Leg swelling 07/31/2018   Tobacco abuse 07/31/2018   Fusion of lumbar spine (L3-S1) 07/20/2018   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   Lumbar degenerative disc disease 07/20/2018   Drug overdose 04/18/2014   Chronic pain syndrome 04/18/2014   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 ([place]) 04/15/2014   Respiratory failure ([place]) 04/14/2014   UTI (urinary tract infection) 04/28/2013   Weakness generalized 04/28/2013   Chronic diastolic CHF (congestive heart failure) ([place]) 04/28/2013   Sinus tachycardia 01/11/2013   COPD with acute exacerbation ([place]) 14/38/8875   Diastolic CHF, acute on chronic ([place]) 01/10/2013   Osteoarthritis of left knee 10/27/2011   COPD (chronic obstructive pulmonary disease) ([place]) 03/29/2011   Hypertension 03/29/2011   PCP:  Waylan Rocher, MD Pharmacy:   [facility], [address] [place] [address] Phone: [phone] Fax: [phone]  [id number] [place] [address] [place]: [phone]: [phone]    Social Determinants of Health (SDOH) Interventions    Readmission Risk Interventions     No data to display

## 2022-07-08 NOTE — Progress Notes (Signed)
Lab states they will be here after inpatient floors to collect CPOD labs

## 2022-07-08 NOTE — Plan of Care (Signed)
Patient ambulated to confirm SPO2 needs with O2.  At rest currently needs [facility] to remain 90%  or above.  Yet with ambulation on 2LNC - SPO2 fell to 68% rapidly.  Patient needed 5LNC to maintain SPO2 90% or above.    Recovered once back at bedside and returned to [facility].

## 2022-07-08 NOTE — Progress Notes (Signed)
Physical Therapy Treatment Patient Details Name: [name] MRN: [id number] DOB: [birth year] Today's Date: 07/08/2022   History of Present Illness Pt is a 66 year old female admitted with acute on chronic respiratory failure with hypoxia, sepsis due to pneumonia, COPD with acute exacerbation after presenting to the ED after cute onset of worsening dyspnea with associated cough that is mainly dry for the last 3 days; PMH significant for heart  failure with preserved ejection fraction, COPD on home O2 at 3 L/min, ongoing tobacco abuse, GERD, hypertension, hypothyroidism, dyslipidemia, seizure disorder and anxiety    PT Comments    Pt received upright in bed resting on 2L/min at rest. Agreeable to PT services. Pt exiting bed  mod-I and standing with supervision without need for SPC. With supine level exercises pt desatting to 90% where prior it was upper 90's. Titrated to 5 L/min with gait ambulating ~80' with SPC. Pt demonstrating correct sequencing and improved tolerance for OOB activity but does require x2 standing rest breaks due to minor dizziness and generalized LE weakness. Pt encouraged to return to room with noted increased WOB but no reports of SOB. HR WNL throughout. Pt returning to sitting in recliner tolerating progressive seated therex returned to 2L/min maintaining > 90%. Educated pt on benefits of HH PT as she is still deconditioned and requiring increased amounts of supplemental O2. Pt in agreement. All needs in reach.    Recommendations for follow up therapy are one component of a multi-disciplinary discharge planning process, led by the attending physician.  Recommendations may be updated based on patient status, additional functional criteria and insurance authorization.  Follow Up Recommendations  Home health PT     Assistance Recommended at Discharge Intermittent Supervision/Assistance  Patient can return home with the following A little help with  bathing/dressing/bathroom;Assistance with cooking/housework;Assist for transportation;Help with stairs or ramp for entrance;A little help with walking and/or transfers   Equipment Recommendations  None recommended by PT    Recommendations for Other Services       Precautions / Restrictions Precautions Precautions: Fall Restrictions Weight Bearing Restrictions: No     Mobility  Bed Mobility Overal bed mobility: Modified Independent               Patient Response: Cooperative  Transfers Overall transfer level: Needs assistance Equipment used: Rolling walker (2 wheels) Transfers: Sit to/from Stand Sit to Stand: Supervision                Ambulation/Gait Ambulation/Gait assistance: Min guard Gait Distance (Feet): 80 Feet Assistive device: Straight cane Gait Pattern/deviations: Step-through pattern, Decreased step length - right, Decreased step length - left, Trunk flexed       General Gait Details: Able to ambulate with SPC with correct sequencing and step though pattern   Stairs             Wheelchair Mobility    Modified Rankin (Stroke Patients Only)       Balance Overall balance assessment: Needs assistance Sitting-balance support: Feet supported, No upper extremity supported Sitting balance-Leahy Scale: Good     Standing balance support: Single extremity supported Standing balance-Leahy Scale: Fair Standing balance comment: use of SPC                            Cognition Arousal/Alertness: Awake/alert Behavior During Therapy: WFL for tasks assessed/performed Overall Cognitive Status: Within Functional Limits for tasks assessed  Exercises General Exercises - Lower Extremity Long Arc Quad: AROM, Strengthening, Both, 10 reps, Seated Hip ABduction/ADduction: AROM, Strengthening, Both, 10 reps, Supine Straight Leg Raises: AROM, Strengthening, Both, 10 reps,  Supine Hip Flexion/Marching: Strengthening, Both, 10 reps, Seated Other Exercises Other Exercises: use of SPC    General Comments General comments (skin integrity, edema, etc.): SPo2> 90%. Required titratino up to 5L/mi to maintain > 90% with gait. Down to 90% with supine exercises.      Pertinent Vitals/Pain Pain Assessment Pain Assessment: No/denies pain    Home Living                          Prior Function            PT Goals (current goals can now be found in the care plan section) Acute Rehab PT Goals Patient Stated Goal: go home Time For Goal Achievement: 07/21/22 Potential to Achieve Goals: Good Progress towards PT goals: Progressing toward goals    Frequency    Min 2X/week      PT Plan Current plan remains appropriate    Co-evaluation              AM-PAC PT "6 Clicks" Mobility   Outcome Measure  Help needed turning from your back to your side while in a flat bed without using bedrails?: None Help needed moving from lying on your back to sitting on the side of a flat bed without using bedrails?: None Help needed moving to and from a bed to a chair (including a wheelchair)?: A Little Help needed standing up from a chair using your arms (e.g., wheelchair or bedside chair)?: A Little Help needed to walk in hospital room?: A Little Help needed climbing 3-5 steps with a railing? : A Lot 6 Click Score: 19    End of Session Equipment Utilized During Treatment: Gait belt;Oxygen Activity Tolerance: Patient tolerated treatment well Patient left: in chair;with chair alarm set Nurse Communication: Mobility status PT Visit Diagnosis: Unsteadiness on feet (R26.81);Difficulty in walking, not elsewhere classified (R26.2);Muscle weakness (generalized) (M62.81)     Time: 3536-1443 PT Time Calculation (min) (ACUTE ONLY): 29 min  Charges:  $Gait Training: 8-22 mins $Therapeutic Exercise: 8-22 mins                     Yetunde Leis M. Fairly IV, PT,  DPT Physical Therapist- [facility]  07/08/2022, 3:20 PM

## 2022-07-08 NOTE — Progress Notes (Signed)
PROGRESS NOTE    [name]  NFA:[id number] DOB: July 29, 1956 DOA: 07/06/2022 PCP: Waylan Rocher, MD   Assessment & Plan:   Principal Problem:   Acute on chronic respiratory failure with hypoxia ([place]) Active Problems:   Sepsis due to pneumonia ([place])   Severe sepsis ([place])   COPD with acute exacerbation ([place])   Type 2 diabetes mellitus with peripheral neuropathy ([facility])   Hypothyroidism, unspecified  Assessment and Plan: Acute on chronic hypoxic respiratory failure: secondary to severe COPD exacerbation & CAP. Continue on supplemental oxygen and wean back to baseline as tolerated. Dropped into 61s w/ 2L [place] with ambulation today   Severe sepsis: met criteria w/ fever, tachycardia, tachypnea  & likely pneumonia. Continue on IV rocephin, azithromycin, steroids, bronchodilators & encourage incentive spirometry. Blood cxs NGTD. D/c IVFs   COPD exacerbation: continue on azithromycin, steroids, bronchodilators & encourage incentive spirometry   Hypothyroidism: continue on home dose of levothyroxine    DM2: well controlled, HbA1c 5.2. Continue on SSI w/ accuchecks   Peripheral neuropathy: continue on home dose of gabapentin   AKI: unknown baseline Cr/GFR. Cr is trending down daily.      DVT prophylaxis: lovenox  Code Status: DNR Family Communication:  Disposition Plan: likely d/c back home.  Level of care: Progressive  Status is: Inpatient Remains inpatient appropriate because: severity of illness    Consultants:    Procedures:  Antimicrobials: azithromycin, rocephin    Subjective: Pt c/o difficulty breathing but improved from day prior.   Objective: Vitals:   07/08/22 0300 07/08/22 0400 07/08/22 0723 07/08/22 0800  BP: 138/75 124/86  122/80  Pulse: (!) 51 (!) 59 (!) 51 64  Resp: _0 Temp:  98.7 F (37.1 C)  98.5 F (36.9 C)  TempSrc:  Oral  Oral  SpO2: 97% 96% 95% 93%  Weight:      Height:        Intake/Output Summary (Last 24 hours) at  07/08/2022 0814 Last data filed at 07/08/2022 0421 Gross per 24 hour  Intake 2830 ml  Output --  Net 2830 ml   Filed Weights   07/06/22 1100  Weight: 75 kg    Examination:  General exam: Appears comfortable  Respiratory system: decreased breath sounds b/l. No rales Cardiovascular system: S1/S2+. No rubs or clicks  Gastrointestinal system: abd is soft, NT, ND & normal bowel sounds  Central nervous system: alert and oriented. Moves all extremities  Psychiatry: judgement and insight appears normal. Flat mood and affect      Data Reviewed: I have personally reviewed following labs and imaging studies  CBC: Recent Labs  Lab 07/06/22 1110 07/07/22 0535 07/08/22 0658  WBC 11.5* 10.2 11.1*  NEUTROABS 10.8*  --   --   HGB 12.3 12.0 11.7*  HCT 38.2 37.8 36.1  MCV 96.5 97.4 96.0  PLT 204 197 469   Basic Metabolic Panel: Recent Labs  Lab 07/06/22 1110 07/07/22 0535 07/08/22 0658  NA 133* 137 138  K 4.2 4.4 4.6  CL 99 107 109  CO2 _1 GLUCOSE 125* 135* 144*  BUN 20 22 24*  CREATININE 1.54* 1.12* 1.02*  CALCIUM 8.8* 8.5* 9.0   GFR: Estimated Creatinine Clearance: 53.3 mL/min (A) (by C-G formula based on SCr of 1.02 mg/dL (H)). Liver Function Tests: Recent Labs  Lab 07/06/22 1110  AST 23  ALT 14  ALKPHOS 26*  BILITOT 1.4*  PROT 6.4*  ALBUMIN 3.0*   No results for  input(s): "LIPASE", "AMYLASE" in the last 168 hours. No results for input(s): "AMMONIA" in the last 168 hours. Coagulation Profile: Recent Labs  Lab 07/06/22 1110 07/07/22 0535  INR 1.3* 1.3*   Cardiac Enzymes: No results for input(s): "CKTOTAL", "CKMB", "CKMBINDEX", "TROPONINI" in the last 168 hours. BNP (last 3 results) No results for input(s): "PROBNP" in the last 8760 hours. HbA1C: No results for input(s): "HGBA1C" in the last 72 hours. CBG: Recent Labs  Lab 07/07/22 1152 07/07/22 1624 07/07/22 2126  GLUCAP 166* 138* 244*   Lipid Profile: No results for input(s): "CHOL",  "HDL", "LDLCALC", "TRIG", "CHOLHDL", "LDLDIRECT" in the last 72 hours. Thyroid Function Tests: No results for input(s): "TSH", "T4TOTAL", "FREET4", "T3FREE", "THYROIDAB" in the last 72 hours. Anemia Panel: No results for input(s): "VITAMINB12", "FOLATE", "FERRITIN", "TIBC", "IRON", "RETICCTPCT" in the last 72 hours. Sepsis Labs: Recent Labs  Lab 07/06/22 1110 07/06/22 1530 07/07/22 0535  PROCALCITON  --   --  14.02  LATICACIDVEN 0.7 0.6  --     Recent Results (from the past 240 hour(s))  Resp Panel by RT-PCR (Flu A&B, Covid) Anterior Nasal Swab     Status: None   Collection Time: 07/06/22 11:10 AM   Specimen: Anterior Nasal Swab  Result Value Ref Range Status   SARS Coronavirus 2 by RT PCR NEGATIVE NEGATIVE Final    Comment: (NOTE) SARS-CoV-2 target nucleic acids are NOT DETECTED.  The SARS-CoV-2 RNA is generally detectable in upper respiratory specimens during the acute phase of infection. The lowest concentration of SARS-CoV-2 viral copies this assay can detect is 138 copies/mL. A negative result does not preclude SARS-Cov-2 infection and should not be used as the sole basis for treatment or other patient management decisions. A negative result may occur with  improper specimen collection/handling, submission of specimen other than nasopharyngeal swab, presence of viral mutation(s) within the areas targeted by this assay, and inadequate number of viral copies(<138 copies/mL). A negative result must be combined with clinical observations, patient history, and epidemiological information. The expected result is Negative.  Fact Sheet for Patients:  EntrepreneurPulse.com.au  Fact Sheet for Healthcare Providers:  IncredibleEmployment.be  This test is no t yet approved or cleared by the [place] FDA and  has been authorized for detection and/or diagnosis of SARS-CoV-2 by FDA under an Emergency Use Authorization (EUA). This EUA will  remain  in effect (meaning this test can be used) for the duration of the COVID-19 declaration under Section 564(b)(1) of the Act, 21 U.S.C.section 360bbb-3(b)(1), unless the authorization is terminated  or revoked sooner.       Influenza A by PCR NEGATIVE NEGATIVE Final   Influenza B by PCR NEGATIVE NEGATIVE Final    Comment: (NOTE) The Xpert Xpress SARS-CoV-2/FLU/RSV plus assay is intended as an aid in the diagnosis of influenza from Nasopharyngeal swab specimens and should not be used as a sole basis for treatment. Nasal washings and aspirates are unacceptable for Xpert Xpress SARS-CoV-2/FLU/RSV testing.  Fact Sheet for Patients: EntrepreneurPulse.com.au  Fact Sheet for Healthcare Providers: IncredibleEmployment.be  This test is not yet approved or cleared by the [place] FDA and has been authorized for detection and/or diagnosis of SARS-CoV-2 by FDA under an Emergency Use Authorization (EUA). This EUA will remain in effect (meaning this test can be used) for the duration of the COVID-19 declaration under Section 564(b)(1) of the Act, 21 U.S.C. section 360bbb-3(b)(1), unless the authorization is terminated or revoked.  Performed at [facility], [place], [place]  [address]   Blood Culture (routine x 2)     Status: None (Preliminary result)   Collection Time: 07/06/22 11:10 AM   Specimen: BLOOD  Result Value Ref Range Status   Specimen Description BLOOD RIGHT [facility]  Final   Special Requests   Final    BOTTLES DRAWN AEROBIC AND ANAEROBIC Blood Culture adequate volume   Culture   Final    NO GROWTH 2 DAYS Performed at [facility], [address], [place], [place] [address]    Report Status PENDING  Incomplete  Blood Culture (routine x 2)     Status: None (Preliminary result)   Collection Time: 07/06/22 11:10 AM   Specimen: BLOOD  Result Value Ref Range Status   Specimen Description BLOOD RIGHT  FOREARM  Final   Special Requests   Final    BOTTLES DRAWN AEROBIC AND ANAEROBIC Blood Culture adequate volume   Culture   Final    NO GROWTH 2 DAYS Performed at [facility], [address], [place], [place] [address]    Report Status PENDING  Incomplete         Radiology Studies: DG Chest Port 1 View  Result Date: 07/06/2022 CLINICAL DATA:  Questionable sepsis EXAM: PORTABLE CHEST 1 VIEW COMPARISON:  12/16/2021 FINDINGS: Normal cardiac silhouette. There are streaky basilar opacities in the RIGHT lower lobe. No focal consolidation. No pleural fluid. No pneumothorax. IMPRESSION: Streaky basilar opacities in the RIGHT lower lobe is concerning for pneumonia. Followup PA and lateral chest X-ray is recommended in 3-4 weeks following trial of antibiotic therapy to ensure resolution and exclude underlying malignancy. Electronically Signed   By: Suzy Bouchard M.D.   On: 07/06/2022 11:58        Scheduled Meds:  acidophilus  1 capsule Oral Daily   amLODipine  7.5 mg Oral Daily   ARIPiprazole  10 mg Oral Daily   aspirin EC  81 mg Oral Daily   busPIRone  30 mg Oral BID   enoxaparin (LOVENOX) injection  40 mg Subcutaneous Q24H   escitalopram  30 mg Oral Daily   famotidine  20 mg Oral QHS   fenofibrate  160 mg Oral Daily   gabapentin  800 mg Oral TID   insulin aspart  0-6 Units Subcutaneous TID WC   ipratropium-albuterol  3 mL Nebulization QID   levETIRAcetam  500 mg Oral Daily   levothyroxine  25 mcg Oral QAC breakfast   loratadine  10 mg Oral Daily   midodrine  10 mg Oral TID WC   mirabegron ER  25 mg Oral Daily   multivitamin with minerals  1 tablet Oral Daily   pantoprazole  40 mg Oral q morning   pramipexole  0.5 mg Oral QHS   predniSONE  40 mg Oral Q breakfast   Continuous Infusions:  sodium chloride 100 mL/hr at 07/07/22 1019   azithromycin Stopped (07/07/22 1355)   cefTRIAXone (ROCEPHIN)  IV Stopped (07/07/22 2319)     LOS: 2 days    Time spent: 36 mins      Wyvonnia Dusky, MD [facility] Pager [phone]  If 7PM-7AM, please contact night-coverage [email or web address] 07/08/2022, 8:14 AM

## 2022-07-09 LAB — BASIC METABOLIC PANEL
Anion gap: 7 (ref 5–15)
BUN: 24 mg/dL — ABNORMAL HIGH (ref 8–23)
CO2: 26 mmol/L (ref 22–32)
Calcium: 9.3 mg/dL (ref 8.9–10.3)
Chloride: 108 mmol/L (ref 98–111)
Creatinine, Ser: 1.03 mg/dL — ABNORMAL HIGH (ref 0.44–1.00)
GFR, Estimated: >60 mL/min (ref >60)
Glucose, Bld: 88 mg/dL (ref 70–99)
Potassium: 4.3 mmol/L (ref 3.5–5.1)
Sodium: 141 mmol/L (ref 135–145)

## 2022-07-09 LAB — CBC
HCT: 36.5 % (ref 36.0–46.0)
Hemoglobin: 11.7 g/dL — ABNORMAL LOW (ref 12.0–15.0)
MCH: 30.5 pg (ref 26.0–34.0)
MCHC: 32.1 g/dL (ref 30.0–36.0)
MCV: 95.1 fL (ref 80.0–100.0)
Platelets: 215 10*3/uL (ref 150–400)
RBC: 3.84 MIL/uL — ABNORMAL LOW (ref 3.87–5.11)
RDW: 14 % (ref 11.5–15.5)
WBC: 8.5 10*3/uL (ref 4.0–10.5)
nRBC: 0 % (ref 0.0–0.2)

## 2022-07-09 LAB — GLUCOSE, CAPILLARY
Glucose-Capillary: 86 mg/dL (ref 70–99)
Glucose-Capillary: 159 mg/dL — ABNORMAL HIGH (ref 70–99)

## 2022-07-09 MED ORDER — PREDNISONE 20 MG PO TABS: 40.0000 mg | ORAL_TABLET | Freq: Every day | ORAL | 0 refills | Status: AC

## 2022-07-09 MED ORDER — PREDNISONE 10 MG PO TABS: 10.0000 mg | ORAL_TABLET | Freq: Every day | ORAL | Status: DC

## 2022-07-09 NOTE — Care Management Important Message (Signed)
Important Message  Patient Details  Name: [name] MRN: [id number] Date of Birth: 10/21/1956   Medicare Important Message Given:  Yes     Dannette Barbara 07/09/2022, 11:04 AM

## 2022-07-09 NOTE — TOC Transition Note (Signed)
Transition of Care [facility]) - CM/SW Discharge Note   Patient Details  Name: [name] MRN: [id number] Date of Birth: May 25, 1956  [facility] CM/SW Contact:  Alberteen Sam, LCSW Phone Number: 07/09/2022, 11:36 AM   Clinical Narrative:     Patient to discharge home today, walker and 3in1 delivered to hospital room, lift chair and scooter to be delivered to home. Patient is set up with [facility] for PT, Corene Cornea informed of dc today.   No further dc needs.    Final next level of care: [place] Barriers to Discharge: No Barriers Identified   Patient Goals and CMS Choice Patient states their goals for this hospitalization and ongoing recovery are:: to go home CMS Medicare.gov Compare Post Acute Care list provided to:: Patient Choice offered to / list presented to : Patient  Discharge Placement                       Discharge Plan and Services                DME Arranged: 3-N-1, Environmental consultant (lift chair and scooter) DME Agency: [employer]       HH Arranged: PT [place] Agency: [place] ([facility]) Date HH Agency Contacted: 07/09/22   Representative spoke with at [place]: [place] ([place]) Interventions     Readmission Risk Interventions     No data to display

## 2022-07-09 NOTE — Discharge Summary (Addendum)
Physician Discharge Summary  [name]:[id number] DOB: May 05, 1956 DOA: 07/06/2022  PCP: Waylan Rocher, MD  Admit date: 07/06/2022 Discharge date: 07/09/2022  Admitted From: home  Disposition:  home w/ home health   Recommendations for Outpatient Follow-up:  Follow up with PCP in 1-2 weeks   Home Health: yes Equipment/Devices:  Discharge Condition: stable  CODE STATUS: DNR  Diet recommendation: Heart Healthy / Carb Modified  Brief/Interim Summary: HPI was taken from Dr. Sidney Ace: [name] is a 66 y.o. female with medical history significant for heart failure with preserved ejection fraction, COPD on home O2 at 3 L/min, ongoing tobacco abuse, GERD, hypertension, hypothyroidism, dyslipidemia, seizure disorder and anxiety, who presented to the emergency room with acute onset of worsening dyspnea with associated cough that is mainly dry for the last 3 days.  She developed a fever today as well and has hypoxia on her home O2.  Her Pulsoxymeter was down to 70%.  She had a recent rectal prolapse surgery.  She denies any chest pain or palpitations.  She was febrile here.  She continues to smoke 1 pack of status.  They.  She has not used her nebulizer today.  No nausea or vomiting or abdominal pain.  No bleeding diathesis.  No dysuria, oliguria or hematuria or flank pain. ED Course: When she came to the ER, BP was 96/45 with a heart rate of 88 respiratory to 21% to 70% on room air.  Pulse symmetry was up to 93% on nonrebreather mask at 12 L/min initially required BiPAP with improvement of her respiratory rate and work of breathing. EKG as reviewed by me : EKG showed sinus rhythm with a rate of 97 with probable left atrial enlargement, poor R wave progression and low voltage QRS. Imaging: Portable chest x-ray showed streaky basilar opacities in the right lower lobe concerning for pneumonia with recommendation for follow-up PA and lateral chest x-ray in 3 to 4 weeks to ensure resolution  and exclude malignancy.  The patient was given 1 L bolus of IV normal saline and will be given 30 mL/kg total, 2 DuoNebs, 2 g of IV cefepime and 500 mg of IV Zithromax as well as 650 mg p.o. Tylenol.  She will be admitted to a PCU bed for further evaluation and management.  As per Dr. Jimmye Norman 7/26-7/28/23: Pt was found to have COPD exacerbation & likely pneumonia. Pt was treated w/ IV rocephin, azithromycin, steroids, bronchodilators & incentive spirometry. Of note, pt did require more supplemental oxygen then her baseline while inpatient but pt was able to be weaned back down to her baseline oxygen level prior to d/c. PT/OT evaluated pt and recommended home health.   Discharge Diagnoses:  Principal Problem:   Acute on chronic respiratory failure with hypoxia (HCC) Active Problems:   Sepsis due to pneumonia ([place])   Severe sepsis ([place])   COPD with acute exacerbation (HCC)   Type 2 diabetes mellitus with peripheral neuropathy (HCC)   Hypothyroidism, unspecified  Acute on chronic hypoxic respiratory failure: secondary to severe COPD exacerbation & CAP. Continue on supplemental oxygen and currently at baseline oxygen level 3L [place]    Severe sepsis: met criteria w/ fever, tachycardia, tachypnea, hypotension, hypoxia  & likely pneumonia. Continue on IV rocephin, azithromycin, steroids, bronchodilators & encourage incentive spirometry. Blood cxs NGTD. D/c IVFs   COPD exacerbation: continue on azithromycin, steroids, bronchodilators & encourage incentive spirometry    Hypothyroidism: continue on home dose of levothyroxine    DM2: well controlled, HbA1c  5.2. Continue on SSI w/ accuchecks    Peripheral neuropathy: continue on home dose of gabapentin    AKI: unknown baseline Cr/GFR. Cr is trending down daily.  Discharge Instructions  Discharge Instructions     Diet - low sodium heart healthy   Complete by: As directed    Discharge instructions   Complete by: As directed    F/u w/ PCP in 1-2  weeks   Increase activity slowly   Complete by: As directed    No wound care   Complete by: As directed       Allergies as of 07/09/2022       Reactions   Hydrocodone Nausea And Vomiting   Nsaids Other (See Comments)   Pt states it messes up her kidneys But takes aspirin & ibuprofen   Buchu-cornsilk-ch Grass-hydran Other (See Comments)   Thiazide-type Diuretics    Other reaction(s): Unknown        Medication List     TAKE these medications    acidophilus Caps capsule Take 1 capsule by mouth daily.   albuterol 108 (90 Base) MCG/ACT inhaler Commonly known as: VENTOLIN HFA Inhale 2 puffs into the lungs every 4 (four) hours as needed for wheezing or shortness of breath.   amLODipine 5 MG tablet Commonly known as: NORVASC Take 7.5 mg by mouth daily.   Anoro Ellipta 62.5-25 MCG/ACT Aepb Generic drug: umeclidinium-vilanterol Inhale 1 puff into the lungs daily.   ARIPiprazole 10 MG tablet Commonly known as: ABILIFY Take 10 mg by mouth daily.   aspirin EC 81 MG tablet Take 81 mg by mouth daily.   Biofreeze 10 % Crea Generic drug: Menthol (Topical Analgesic) Apply 1 Application topically daily as needed (pain).   busPIRone 30 MG tablet Commonly known as: BUSPAR Take 30 mg by mouth 2 (two) times daily.   Centrum Silver 50+Women Tabs Take 1 tablet by mouth daily.   cetirizine 10 MG tablet Commonly known as: ZYRTEC Take 10 mg by mouth daily as needed for allergies or rhinitis.   escitalopram 20 MG tablet Commonly known as: LEXAPRO Take 30 mg by mouth daily.   famotidine 40 MG tablet Commonly known as: PEPCID Take 40 mg by mouth at bedtime.   fenofibrate micronized 134 MG capsule Commonly known as: LOFIBRA Take 134 mg by mouth daily before breakfast.   fluticasone 50 MCG/ACT nasal spray Commonly known as: FLONASE Place 1 spray into both nostrils daily as needed for allergies or rhinitis.   furosemide 40 MG tablet Commonly known as: LASIX Take 1  tablet (40 mg total) by mouth daily. May take additional 40 mg (one tablet) as needed for swelling, weight gain or sob   gabapentin 800 MG tablet Commonly known as: NEURONTIN Take 800 mg by mouth 3 (three) times daily.   Gemtesa 75 MG Tabs Generic drug: Vibegron Take 75 mg by mouth daily.   hydrOXYzine 25 MG tablet Commonly known as: ATARAX Take 25 mg by mouth 4 (four) times daily as needed.   levETIRAcetam 500 MG tablet Commonly known as: KEPPRA Take 1 tablet (500 mg total) by mouth 2 (two) times daily. What changed: when to take this   levothyroxine 25 MCG tablet Commonly known as: SYNTHROID Take 25 mcg by mouth daily before breakfast.   loperamide 2 MG capsule Commonly known as: IMODIUM Take 12 mg by mouth as needed for diarrhea or loose stools.   losartan 100 MG tablet Commonly known as: COZAAR Take 100 mg by mouth daily.  nortriptyline 75 MG capsule Commonly known as: PAMELOR Take 75 mg by mouth at bedtime.   oxyCODONE 5 MG immediate release tablet Commonly known as: Oxy IR/ROXICODONE Take 5 mg by mouth every 6 (six) hours as needed.   OXYGEN Inhale 3 L into the lungs at bedtime.   pantoprazole 40 MG tablet Commonly known as: PROTONIX Take 40 mg by mouth every morning.   pramipexole 0.5 MG tablet Commonly known as: MIRAPEX Take 0.5 mg by mouth at bedtime.   predniSONE 10 MG tablet Commonly known as: DELTASONE Take 1 tablet (10 mg total) by mouth daily with breakfast. Hold this medication until you complete prednisone 40mg  daily x 5 days. What changed:  how much to take when to take this additional instructions   predniSONE 20 MG tablet Commonly known as: DELTASONE Take 2 tablets (40 mg total) by mouth daily for 5 days. What changed: You were already taking a medication with the same name, and this prescription was added. Make sure you understand how and when to take each.   Symbicort 160-4.5 MCG/ACT inhaler Generic drug:  budesonide-formoterol Inhale 2 puffs into the lungs 2 (two) times daily.   traMADol 50 MG tablet Commonly known as: ULTRAM Take 1-2 tablets (50-100 mg total) by mouth every 6 (six) hours as needed for moderate pain or severe pain.   Trelegy Ellipta 100-62.5-25 MCG/ACT Aepb Generic drug: Fluticasone-Umeclidin-Vilant Inhale 1 puff into the lungs daily.               Durable Medical Equipment  (From admission, onward)           Start     Ordered   07/09/22 1135  For home use only DME Other see comment  Once       Comments: scooter  Question:  Length of Need  Answer:  Lifetime   07/09/22 1134   07/09/22 1134  For home use only DME Other see comment  Once       Comments: Lift chair  Question:  Length of Need  Answer:  Lifetime   07/09/22 1134            Allergies  Allergen Reactions   Hydrocodone Nausea And Vomiting   Nsaids Other (See Comments)    Pt states it messes up her kidneys But takes aspirin & ibuprofen   Buchu-Cornsilk-Ch Grass-Hydran Other (See Comments)   Thiazide-Type Diuretics     Other reaction(s): Unknown    Consultations:    Procedures/Studies: DG Chest Port 1 View  Result Date: 07/06/2022 CLINICAL DATA:  Questionable sepsis EXAM: PORTABLE CHEST 1 VIEW COMPARISON:  12/16/2021 FINDINGS: Normal cardiac silhouette. There are streaky basilar opacities in the RIGHT lower lobe. No focal consolidation. No pleural fluid. No pneumothorax. IMPRESSION: Streaky basilar opacities in the RIGHT lower lobe is concerning for pneumonia. Followup PA and lateral chest X-ray is recommended in 3-4 weeks following trial of antibiotic therapy to ensure resolution and exclude underlying malignancy. Electronically Signed   By: Suzy Bouchard M.D.   On: 07/06/2022 11:58   (Echo, Carotid, EGD, Colonoscopy, ERCP)    Subjective: Pt c/o malaise    Discharge Exam: Vitals:   07/09/22 0726 07/09/22 1146  BP: 122/67 (!) 123/59  Pulse: (!) 59 (!) 58  Resp: 16 18   Temp: 97.8 F (36.6 C) 98.4 F (36.9 C)  SpO2: 93% 95%   Vitals:   07/09/22 0015 07/09/22 0434 07/09/22 0726 07/09/22 1146  BP: 133/70 126/68 122/67 (!) 123/59  Pulse: (!) 54 (!)  52 (!) 59 (!) 58  Resp: $Remo'16 15 16 18  'kQdoJ$ Temp: 98.1 F (36.7 C) 97.8 F (36.6 C) 97.8 F (36.6 C) 98.4 F (36.9 C)  TempSrc:      SpO2: 96% 94% 93% 95%  Weight:      Height:        General: Pt is alert, awake, not in acute distress Cardiovascular: S1/S2 +, no rubs, no gallops Respiratory: diminished breath sounds b/l  Abdominal: Soft, NT, ND, bowel sounds + Extremities: no edema, no cyanosis    The results of significant diagnostics from this hospitalization (including imaging, microbiology, ancillary and laboratory) are listed below for reference.     Microbiology: Recent Results (from the past 240 hour(s))  Resp Panel by RT-PCR (Flu A&B, Covid) Anterior Nasal Swab     Status: None   Collection Time: 07/06/22 11:10 AM   Specimen: Anterior Nasal Swab  Result Value Ref Range Status   SARS Coronavirus 2 by RT PCR NEGATIVE NEGATIVE Final    Comment: (NOTE) SARS-CoV-2 target nucleic acids are NOT DETECTED.  The SARS-CoV-2 RNA is generally detectable in upper respiratory specimens during the acute phase of infection. The lowest concentration of SARS-CoV-2 viral copies this assay can detect is 138 copies/mL. A negative result does not preclude SARS-Cov-2 infection and should not be used as the sole basis for treatment or other patient management decisions. A negative result may occur with  improper specimen collection/handling, submission of specimen other than nasopharyngeal swab, presence of viral mutation(s) within the areas targeted by this assay, and inadequate number of viral copies(<138 copies/mL). A negative result must be combined with clinical observations, patient history, and epidemiological information. The expected result is Negative.  Fact Sheet for Patients:   EntrepreneurPulse.com.au  Fact Sheet for Healthcare Providers:  IncredibleEmployment.be  This test is no t yet approved or cleared by the [place] FDA and  has been authorized for detection and/or diagnosis of SARS-CoV-2 by FDA under an Emergency Use Authorization (EUA). This EUA will remain  in effect (meaning this test can be used) for the duration of the COVID-19 declaration under Section 564(b)(1) of the Act, 21 U.S.C.section 360bbb-3(b)(1), unless the authorization is terminated  or revoked sooner.       Influenza A by PCR NEGATIVE NEGATIVE Final   Influenza B by PCR NEGATIVE NEGATIVE Final    Comment: (NOTE) The Xpert Xpress SARS-CoV-2/FLU/RSV plus assay is intended as an aid in the diagnosis of influenza from Nasopharyngeal swab specimens and should not be used as a sole basis for treatment. Nasal washings and aspirates are unacceptable for Xpert Xpress SARS-CoV-2/FLU/RSV testing.  Fact Sheet for Patients: EntrepreneurPulse.com.au  Fact Sheet for Healthcare Providers: IncredibleEmployment.be  This test is not yet approved or cleared by the [place] FDA and has been authorized for detection and/or diagnosis of SARS-CoV-2 by FDA under an Emergency Use Authorization (EUA). This EUA will remain in effect (meaning this test can be used) for the duration of the COVID-19 declaration under Section 564(b)(1) of the Act, 21 U.S.C. section 360bbb-3(b)(1), unless the authorization is terminated or revoked.  Performed at [facility], [address] [place], [place] [address]   Blood Culture (routine x 2)     Status: None (Preliminary result)   Collection Time: 07/06/22 11:10 AM   Specimen: BLOOD  Result Value Ref Range Status   Specimen Description BLOOD RIGHT [facility]  Final   Special Requests   Final    BOTTLES DRAWN AEROBIC AND ANAEROBIC Blood  Culture adequate volume   Culture   Final     NO GROWTH 3 DAYS Performed at [facility], [address] [place], [place] [address]    Report Status PENDING  Incomplete  Blood Culture (routine x 2)     Status: None (Preliminary result)   Collection Time: 07/06/22 11:10 AM   Specimen: BLOOD  Result Value Ref Range Status   Specimen Description BLOOD RIGHT FOREARM  Final   Special Requests   Final    BOTTLES DRAWN AEROBIC AND ANAEROBIC Blood Culture adequate volume   Culture   Final    NO GROWTH 3 DAYS Performed at [facility], [address], [place], [place] [address]    Report Status PENDING  Incomplete     Labs: BNP (last 3 results) No results for input(s): "BNP" in the last 8760 hours. Basic Metabolic Panel: Recent Labs  Lab 07/06/22 1110 07/07/22 0535 07/08/22 0658 07/09/22 0555  NA 133* 137 138 141  K 4.2 4.4 4.6 4.3  CL 99 107 109 108  CO2 $Re'27 24 23 26  'fxQ$ GLUCOSE 125* 135* 144* 88  BUN 20 22 24* 24*  CREATININE 1.54* 1.12* 1.02* 1.03*  CALCIUM 8.8* 8.5* 9.0 9.3   Liver Function Tests: Recent Labs  Lab 07/06/22 1110  AST 23  ALT 14  ALKPHOS 26*  BILITOT 1.4*  PROT 6.4*  ALBUMIN 3.0*   No results for input(s): "LIPASE", "AMYLASE" in the last 168 hours. No results for input(s): "AMMONIA" in the last 168 hours. CBC: Recent Labs  Lab 07/06/22 1110 07/07/22 0535 07/08/22 0658 07/09/22 0555  WBC 11.5* 10.2 11.1* 8.5  NEUTROABS 10.8*  --   --   --   HGB 12.3 12.0 11.7* 11.7*  HCT 38.2 37.8 36.1 36.5  MCV 96.5 97.4 96.0 95.1  PLT 204 197 209 215   Cardiac Enzymes: No results for input(s): "CKTOTAL", "CKMB", "CKMBINDEX", "TROPONINI" in the last 168 hours. BNP: Invalid input(s): "POCBNP" CBG: Recent Labs  Lab 07/08/22 1148 07/08/22 1651 07/08/22 2122 07/09/22 0722 07/09/22 1145  GLUCAP 208* 138* 267* 86 159*   D-Dimer No results for input(s): "DDIMER" in the last 72 hours. Hgb A1c No results for input(s): "HGBA1C" in the last 72 hours. Lipid Profile No results  for input(s): "CHOL", "HDL", "LDLCALC", "TRIG", "CHOLHDL", "LDLDIRECT" in the last 72 hours. Thyroid function studies No results for input(s): "TSH", "T4TOTAL", "T3FREE", "THYROIDAB" in the last 72 hours.  Invalid input(s): "FREET3" Anemia work up No results for input(s): "VITAMINB12", "FOLATE", "FERRITIN", "TIBC", "IRON", "RETICCTPCT" in the last 72 hours. Urinalysis    Component Value Date/Time   COLORURINE YELLOW (A) 07/06/2022 2010   APPEARANCEUR CLOUDY (A) 07/06/2022 2010   APPEARANCEUR Clear 05/27/2022 1402   LABSPEC 1.015 07/06/2022 2010   PHURINE 6.0 07/06/2022 2010   GLUCOSEU NEGATIVE 07/06/2022 2010   HGBUR MODERATE (A) 07/06/2022 2010   BILIRUBINUR NEGATIVE 07/06/2022 2010   BILIRUBINUR Negative 05/27/2022 [place] 07/06/2022 2010   PROTEINUR 100 (A) 07/06/2022 2010   UROBILINOGEN 0.2 04/14/2014 1217   NITRITE POSITIVE (A) 07/06/2022 2010   LEUKOCYTESUR LARGE (A) 07/06/2022 2010   Sepsis Labs Recent Labs  Lab 07/06/22 1110 07/07/22 0535 07/08/22 0658 07/09/22 0555  WBC 11.5* 10.2 11.1* 8.5   Microbiology Recent Results (from the past 240 hour(s))  Resp Panel by RT-PCR (Flu A&B, Covid) Anterior Nasal Swab     Status: None   Collection Time: 07/06/22 11:10 AM   Specimen: Anterior Nasal  Swab  Result Value Ref Range Status   SARS Coronavirus 2 by RT PCR NEGATIVE NEGATIVE Final    Comment: (NOTE) SARS-CoV-2 target nucleic acids are NOT DETECTED.  The SARS-CoV-2 RNA is generally detectable in upper respiratory specimens during the acute phase of infection. The lowest concentration of SARS-CoV-2 viral copies this assay can detect is 138 copies/mL. A negative result does not preclude SARS-Cov-2 infection and should not be used as the sole basis for treatment or other patient management decisions. A negative result may occur with  improper specimen collection/handling, submission of specimen other than nasopharyngeal swab, presence of viral  mutation(s) within the areas targeted by this assay, and inadequate number of viral copies(<138 copies/mL). A negative result must be combined with clinical observations, patient history, and epidemiological information. The expected result is Negative.  Fact Sheet for Patients:  EntrepreneurPulse.com.au  Fact Sheet for Healthcare Providers:  IncredibleEmployment.be  This test is no t yet approved or cleared by the [place] FDA and  has been authorized for detection and/or diagnosis of SARS-CoV-2 by FDA under an Emergency Use Authorization (EUA). This EUA will remain  in effect (meaning this test can be used) for the duration of the COVID-19 declaration under Section 564(b)(1) of the Act, 21 U.S.C.section 360bbb-3(b)(1), unless the authorization is terminated  or revoked sooner.       Influenza A by PCR NEGATIVE NEGATIVE Final   Influenza B by PCR NEGATIVE NEGATIVE Final    Comment: (NOTE) The Xpert Xpress SARS-CoV-2/FLU/RSV plus assay is intended as an aid in the diagnosis of influenza from Nasopharyngeal swab specimens and should not be used as a sole basis for treatment. Nasal washings and aspirates are unacceptable for Xpert Xpress SARS-CoV-2/FLU/RSV testing.  Fact Sheet for Patients: EntrepreneurPulse.com.au  Fact Sheet for Healthcare Providers: IncredibleEmployment.be  This test is not yet approved or cleared by the [place] FDA and has been authorized for detection and/or diagnosis of SARS-CoV-2 by FDA under an Emergency Use Authorization (EUA). This EUA will remain in effect (meaning this test can be used) for the duration of the COVID-19 declaration under Section 564(b)(1) of the Act, 21 U.S.C. section 360bbb-3(b)(1), unless the authorization is terminated or revoked.  Performed at [facility], [address] [place], [place] [address]   Blood Culture (routine x 2)      Status: None (Preliminary result)   Collection Time: 07/06/22 11:10 AM   Specimen: BLOOD  Result Value Ref Range Status   Specimen Description BLOOD RIGHT [facility]  Final   Special Requests   Final    BOTTLES DRAWN AEROBIC AND ANAEROBIC Blood Culture adequate volume   Culture   Final    NO GROWTH 3 DAYS Performed at [facility], [address], [place], [place] [address]    Report Status PENDING  Incomplete  Blood Culture (routine x 2)     Status: None (Preliminary result)   Collection Time: 07/06/22 11:10 AM   Specimen: BLOOD  Result Value Ref Range Status   Specimen Description BLOOD RIGHT FOREARM  Final   Special Requests   Final    BOTTLES DRAWN AEROBIC AND ANAEROBIC Blood Culture adequate volume   Culture   Final    NO GROWTH 3 DAYS Performed at [facility], [address], [place],  [address]    Report Status PENDING  Incomplete     Time coordinating discharge: Over 30 minutes  SIGNED:   Wyvonnia Dusky, MD  [facility] 07/09/2022, 12:29 PM  Pager   If 7PM-7AM, please contact night-coverage [email or web address]

## 2022-07-11 LAB — CULTURE, BLOOD (ROUTINE X 2)
Culture: NO GROWTH
Culture: NO GROWTH
Special Requests: ADEQUATE
Special Requests: ADEQUATE

## 2022-08-10 ENCOUNTER — Other Ambulatory Visit: Payer: Self-pay | Admitting: Orthopedic Surgery

## 2022-08-10 ENCOUNTER — Other Ambulatory Visit (HOSPITAL_COMMUNITY): Payer: Self-pay | Admitting: Orthopedic Surgery

## 2022-08-10 DIAGNOSIS — Z96651 Presence of right artificial knee joint: Secondary | ICD-10-CM

## 2022-08-10 DIAGNOSIS — Z96659 Presence of unspecified artificial knee joint: Secondary | ICD-10-CM

## 2022-08-18 ENCOUNTER — Ambulatory Visit
Admission: RE | Admit: 2022-08-18 | Discharge: 2022-08-18 | Disposition: A | Discharge status: Home / Self Care | Payer: Medicare Other | Source: Ambulatory Visit | Attending: Orthopedic Surgery | Admitting: Orthopedic Surgery

## 2022-08-18 DIAGNOSIS — Z96659 Presence of unspecified artificial knee joint: Secondary | ICD-10-CM | POA: Diagnosis present

## 2022-08-18 DIAGNOSIS — Z96651 Presence of right artificial knee joint: Secondary | ICD-10-CM | POA: Insufficient documentation

## 2022-08-18 MED ORDER — TECHNETIUM TC 99M MEDRONATE IV KIT
20.0000 | PACK | Freq: Once | INTRAVENOUS | Status: AC | PRN
  Administered 2022-08-18: 20.49 via INTRAVENOUS

## 2022-08-31 ENCOUNTER — Ambulatory Visit
Admission: RE | Admit: 2022-08-31 | Discharge: 2022-08-31 | Disposition: A | Discharge status: Home / Self Care | Payer: Medicare Other | Source: Ambulatory Visit | Attending: Internal Medicine | Admitting: Internal Medicine

## 2022-08-31 DIAGNOSIS — Z1231 Encounter for screening mammogram for malignant neoplasm of breast: Secondary | ICD-10-CM | POA: Insufficient documentation

## 2022-10-11 ENCOUNTER — Encounter (INDEPENDENT_AMBULATORY_CARE_PROVIDER_SITE_OTHER): Payer: Self-pay

## 2022-11-15 ENCOUNTER — Encounter: Payer: Self-pay | Admitting: Internal Medicine

## 2022-11-15 ENCOUNTER — Ambulatory Visit (INDEPENDENT_AMBULATORY_CARE_PROVIDER_SITE_OTHER): Payer: Medicare Other | Admitting: Internal Medicine

## 2022-11-15 VITALS — BP 140/82 | HR 104 | Temp 98.4°F | Resp 18 | Ht 66.0 in | Wt 195.2 lb

## 2022-11-15 DIAGNOSIS — I10 Essential (primary) hypertension: Secondary | ICD-10-CM | POA: Diagnosis not present

## 2022-11-15 DIAGNOSIS — Z23 Encounter for immunization: Secondary | ICD-10-CM | POA: Diagnosis not present

## 2022-11-15 DIAGNOSIS — R7303 Prediabetes: Secondary | ICD-10-CM

## 2022-11-15 DIAGNOSIS — E782 Mixed hyperlipidemia: Secondary | ICD-10-CM

## 2022-11-15 DIAGNOSIS — E039 Hypothyroidism, unspecified: Secondary | ICD-10-CM

## 2022-11-15 DIAGNOSIS — F339 Major depressive disorder, recurrent, unspecified: Secondary | ICD-10-CM

## 2022-11-15 DIAGNOSIS — Z1159 Encounter for screening for other viral diseases: Secondary | ICD-10-CM

## 2022-11-15 DIAGNOSIS — J441 Chronic obstructive pulmonary disease with (acute) exacerbation: Secondary | ICD-10-CM

## 2022-11-15 DIAGNOSIS — E559 Vitamin D deficiency, unspecified: Secondary | ICD-10-CM

## 2022-11-15 DIAGNOSIS — D649 Anemia, unspecified: Secondary | ICD-10-CM

## 2022-11-15 DIAGNOSIS — I5033 Acute on chronic diastolic (congestive) heart failure: Secondary | ICD-10-CM

## 2022-11-15 NOTE — Progress Notes (Signed)
New Patient Office Visit  Subjective    Patient ID: [name], female    DOB: 1956-03-14  Age: 66 y.o. MRN: [id number]  CC:  Chief Complaint  Patient presents with   Establish Care    Pt see/ cardio, ortho and pulmonolgy    HPI [name] presents to establish care.  Hypertension/HFpEF: -Medications: Losartan 100 mg, Lasix 40 mg, amlodipine 5 mg -Patient is compliant with above medications and reports no side effects. -Denies any SOB, CP, vision changes, LE edema or symptoms of hypotension -Last echo 12/29/2019 with an EF of 60 to 65%  HLD: -Medications: Fenofibrate 134 mg -Patient is compliant with above medications and reports no side effects.  -Last lipid panel: Lipid Panel     Component Value Date/Time   CHOL  03/13/2010 0214    98        ATP III CLASSIFICATION:  <200     mg/dL   Desirable  200-239  mg/dL   Borderline High  >=240    mg/dL   High          TRIG 66 03/13/2010 0214   HDL 24 (L) 03/13/2010 0214   CHOLHDL 4.1 03/13/2010 0214   VLDL 13 03/13/2010 0214   LDLCALC  03/13/2010 0214    61        Total Cholesterol/HDL:CHD Risk Coronary Heart Disease Risk Table                     Men   Women  1/2 Average Risk   3.4   3.3  Average Risk       5.0   4.4  2 X Average Risk   9.6   7.1  3 X Average Risk  23.4   11.0        Use the calculated Patient Ratio above and the CHD Risk Table to determine the patient's CHD Risk.        ATP III CLASSIFICATION (LDL):  <100     mg/dL   Optimal  100-129  mg/dL   Near or Above                    Optimal  130-159  mg/dL   Borderline  160-189  mg/dL   High  >190     mg/dL   Very High    COPD: -COPD status: stable -Current medications: Trelegy, Albuterol  -Satisfied with current treatment?: yes -Oxygen use: no -Dyspnea frequency: with activity  -Cough frequency: daily  -Rescue inhaler frequency:  rarely  -Limitation of activity: yes -Pneumovax: Not up to Date -Influenza: Up to  Date  Hypothyroidism -Medications: Levothyroxine 25 mcg -Patient is compliant with the above medication (s) at the above dose and reports no medication side effects.  -Denies weight changes, cold./heat intolerance, skin changes, anxiety/palpitations  -Last TSH: 2021 1.240  MDD: -Mood status: stable -Current treatment: Lexapro 20 mg, Abilify 10 mg, hydroxyzine PRN -Satisfied with current treatment?: yes -Duration of current treatment : years -Side effects: no Medication compliance: excellent compliance     11/15/2022    2:21 PM 08/23/2018   12:00 PM 08/02/2018   11:43 AM 07/20/2018   11:35 AM  Depression screen PHQ 2/9  Decreased Interest 1     Down, Depressed, Hopeless      PHQ - 2 Score 1        Information is confidential and restricted. Go to Review Flowsheets to unlock data.   Prediabetes: -Last  A1c 1 year ago 5.2%, highest A1c in chart 5.8%. -Does have a history of gestational diabetes -Not currently on any medications  History of seizures: -Had 1 seizure in 2012, had been on Keppra but is not on currently.  She is not on any medications for seizures currently.  She has not had any seizures since the one incident in 2012  GERD: -Currently on Protonix 40 mg and Pepcid 40 mg  Outpatient Encounter Medications as of 11/15/2022  Medication Sig   acidophilus (RISAQUAD) CAPS capsule Take 1 capsule by mouth daily.   albuterol (VENTOLIN HFA) 108 (90 Base) MCG/ACT inhaler Inhale 2 puffs into the lungs every 4 (four) hours as needed for wheezing or shortness of breath.   amLODipine (NORVASC) 5 MG tablet Take 7.5 mg by mouth daily.   ARIPiprazole (ABILIFY) 10 MG tablet Take 10 mg by mouth daily.   escitalopram (LEXAPRO) 20 MG tablet Take 30 mg by mouth daily.   famotidine (PEPCID) 40 MG tablet Take 40 mg by mouth at bedtime.   fenofibrate micronized (LOFIBRA) 134 MG capsule Take 134 mg by mouth daily before breakfast.    Fluticasone-Umeclidin-Vilant (TRELEGY ELLIPTA) 100-62.5-25  MCG/ACT AEPB Inhale 1 puff into the lungs daily.   furosemide (LASIX) 40 MG tablet Take 1 tablet (40 mg total) by mouth daily. May take additional 40 mg (one tablet) as needed for swelling, weight gain or sob   gabapentin (NEURONTIN) 800 MG tablet Take 800 mg by mouth 3 (three) times daily.    hydrOXYzine (ATARAX) 25 MG tablet Take 25 mg by mouth 4 (four) times daily as needed.   levothyroxine (SYNTHROID, LEVOTHROID) 25 MCG tablet Take 25 mcg by mouth daily before breakfast.    losartan (COZAAR) 100 MG tablet Take 100 mg by mouth daily.    OXYGEN Inhale 3 L into the lungs at bedtime.   pantoprazole (PROTONIX) 40 MG tablet Take 40 mg by mouth every morning.    pramipexole (MIRAPEX) 0.5 MG tablet Take 0.5 mg by mouth at bedtime.   predniSONE (DELTASONE) 10 MG tablet Take 1 tablet (10 mg total) by mouth daily with breakfast. Hold this medication until you complete prednisone '40mg'$  daily x 5 days.   SYMBICORT 160-4.5 MCG/ACT inhaler Inhale 2 puffs into the lungs 2 (two) times daily.   Vibegron (GEMTESA) 75 MG TABS Take 75 mg by mouth daily.   [DISCONTINUED] aspirin 81 MG EC tablet Take 81 mg by mouth daily.  (Patient not taking: Reported on 07/06/2022)   [DISCONTINUED] busPIRone (BUSPAR) 30 MG tablet Take 30 mg by mouth 2 (two) times daily.   [DISCONTINUED] cetirizine (ZYRTEC) 10 MG tablet Take 10 mg by mouth daily as needed for allergies or rhinitis.   [DISCONTINUED] fluticasone (FLONASE) 50 MCG/ACT nasal spray Place 1 spray into both nostrils daily as needed for allergies or rhinitis.   [DISCONTINUED] levETIRAcetam (KEPPRA) 500 MG tablet Take 1 tablet (500 mg total) by mouth 2 (two) times daily. (Patient taking differently: Take 500 mg by mouth daily.)   [DISCONTINUED] loperamide (IMODIUM) 2 MG capsule Take 12 mg by mouth as needed for diarrhea or loose stools.   [DISCONTINUED] Menthol, Topical Analgesic, (BIOFREEZE) 10 % CREA Apply 1 Application topically daily as needed (pain).   [DISCONTINUED]  Multiple Vitamins-Minerals (CENTRUM SILVER 50+WOMEN) TABS Take 1 tablet by mouth daily.   [DISCONTINUED] nortriptyline (PAMELOR) 75 MG capsule Take 75 mg by mouth at bedtime.   [DISCONTINUED] oxyCODONE (OXY IR/ROXICODONE) 5 MG immediate release tablet Take 5 mg by mouth  every 6 (six) hours as needed.   [DISCONTINUED] traMADol (ULTRAM) 50 MG tablet Take 1-2 tablets (50-100 mg total) by mouth every 6 (six) hours as needed for moderate pain or severe pain.   [DISCONTINUED] umeclidinium-vilanterol (ANORO ELLIPTA) 62.5-25 MCG/INH AEPB Inhale 1 puff into the lungs daily. (Patient not taking: Reported on 06/01/2022)   Facility-Administered Encounter Medications as of 11/15/2022  Medication   chlorhexidine (HIBICLENS) 4 % liquid 4 application   chlorhexidine (HIBICLENS) 4 % liquid 4 application    Past Medical History:  Diagnosis Date   (HFpEF) heart failure with preserved ejection fraction (HCC)    Acute renal failure ([place]) 03/29/2011   Acute respiratory distress 11/2020   Acute respiratory failure ([place]) 04/14/2014   Anxiety    Arthritis    CHF (congestive heart failure) (HCC)    Chronic respiratory failure with hypoxia and hypercapnia ([place])    Community acquired pneumonia 01/10/2013   COPD (chronic obstructive pulmonary disease) (HCC)    no inhalers--smoker, no oxygen   Depression    Dyspnea    Emphysema lung (HCC)    GERD (gastroesophageal reflux disease)    Hyperlipidemia    Hypertension    Hypothyroid    Peripheral vascular disease ([place])    Pre-diabetes    Restless leg syndrome    Seizures ([place])    02/13/2011 -hx of seizure due to "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  no seizure since--pt does take keppra and office note from neurologist dr. Leta Baptist on this chart   Urinary incontinence     Past Surgical History:  Procedure Laterality Date   CARPAL TUNNEL RELEASE     left    COLONOSCOPY WITH PROPOFOL N/A 06/17/2022   Procedure: COLONOSCOPY WITH PROPOFOL;  Surgeon: Lesly Rubenstein, MD;  Location: [facility];  Service: Endoscopy;  Laterality: N/A;   JOINT REPLACEMENT  08/26/2011   right total knee arthroplasty, left total knee   KNEE ARTHROPLASTY  10/28/2011   Procedure: COMPUTER ASSISTED TOTAL KNEE ARTHROPLASTY;  Surgeon: Karen Chafe Rendall III;  Location: [facility];  Service: Orthopedics;  Laterality: Left;  preop femoral nerve block   KNEE SURGERY     right   laminectomy and diskectomy     L4-5 with fusion   PROCTOSCOPY N/A 06/18/2022   Procedure: RIGID PROCTOSCOPY;  Surgeon: Michael Boston, MD;  Location: [facility];  Service: General;  Laterality: N/A;    Family History  Problem Relation Age of Onset   Heart attack Father 92   Alzheimer's disease Mother    Breast cancer Neg Hx     Social History   Socioeconomic History   Marital status: Widowed    Spouse name: Not on file   Number of children: Not on file   Years of education: Not on file   Highest education level: Not on file  Occupational History   Not on file  Tobacco Use   Smoking status: Every Day    Packs/day: 1.00    Years: 48.00    Total pack years: 48.00    Types: Cigarettes   Smokeless tobacco: Never  Vaping Use   Vaping Use: Every day   Substances: Nicotine  Substance and Sexual Activity   Alcohol use: Yes    Comment: occasional   Drug use: No   Sexual activity: Yes    Birth control/protection: Post-menopausal  Other Topics Concern   Not on file  Social History Narrative   ** Merged History Encounter **       Social Determinants of Health   Financial Resource Strain: Not on file  Food Insecurity: No Food Insecurity (11/18/2020)   Hunger Vital Sign    Worried About Running Out of Food in the Last Year: Never true    Ran Out of Food in the Last Year: Never true  Transportation Needs: No Transportation Needs (11/18/2020)   PRAPARE - Hydrologist  (Medical): No    Lack of Transportation (Non-Medical): No  Physical Activity: Not on file  Stress: Not on file  Social Connections: Not on file  Intimate Partner Violence: Not on file    Review of Systems  All other systems reviewed and are negative.       Objective    BP (!) 140/82   Pulse (!) 104   Temp 98.4 F (36.9 C)   Resp 18   Ht '5\' 6"'$  (1.676 m)   Wt 195 lb 3.2 oz (88.5 kg)   SpO2 92%   BMI 31.51 kg/m   Physical Exam Constitutional:      Appearance: Normal appearance.  HENT:     Head: Normocephalic and atraumatic.     Mouth/Throat:     Mouth: Mucous membranes are moist.     Pharynx: Oropharynx is clear.  Eyes:     Conjunctiva/sclera: Conjunctivae normal.  Cardiovascular:     Rate and Rhythm: Normal rate and regular rhythm.  Pulmonary:     Effort: Pulmonary effort is normal.     Breath sounds: Normal breath sounds.     Comments: Poor air movement throughout Musculoskeletal:     Right lower leg: No edema.     Left lower leg: No edema.  Skin:    General: Skin is warm and dry.     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     Mental Status: She is alert. Mental status is at baseline.  Psychiatric:        Mood and Affect: Mood normal.        Behavior: Behavior normal.         Assessment & Plan:   1. Primary hypertension/ Diastolic CHF, acute on chronic [facility]): Chronic, blood pressure slightly elevated today patient states she is in pain.  Obtain CBC and CMP today.  Reviewed last echo from 2021.  Continue losartan 100 mg, amlodipine 5 mg, Lasix 40 mg.  - CBC w/Diff/Platelet - COMPLETE METABOLIC PANEL WITH GFR  2. Mixed hyperlipidemia: Obtain lipid panel today.  Patient does not believe she has ever been on a statin medication although she is on fenofibrate.  - Lipid Profile  3. Chronic obstructive pulmonary disease with acute exacerbation ([place]): Stable,  continue Trelegy and albuterol as needed.  She does have a pulmonologist.  4. Episode of recurrent major depressive disorder, unspecified depression episode severity ([place]): Stable, continue Lexapro 20 mg, Abilify 10 mg and hydroxyzine as needed.  5. Prediabetes: Check A1c today.  - HgB A1c  6. Normocytic anemia: Check CBC, iron panel, B12 and folate today.  - CBC w/Diff/Platelet - Fe+TIBC+Fer - B12 and Folate Panel  7. Vitamin D deficiency: Check vitamin D with above labs.  - Vitamin D (25 hydroxy)  8. Need for hepatitis C screening test: Hepatitis C screening due today.  - Hepatitis C Antibody  9. Need for prophylactic vaccination with Streptococcus  pneumoniae (Pneumococcus) and Influenza vaccines: Prevnar 20 administered today.  - Pneumococcal conjugate vaccine 20-valent (Prevnar 20)  10. Hypothyroidism, unspecified type: Check TSH today.  Continue levothyroxine 25 mcg daily.  - TSH   Return in 4 weeks (on 12/13/2022) for 1 onth medical follow up, AMW w/ nurse.   [name], DO

## 2022-11-16 LAB — CBC WITH DIFFERENTIAL/PLATELET
Absolute Monocytes: 592 cells/uL (ref 200–950)
Basophils Absolute: 85 cells/uL (ref 0–200)
Basophils Relative: 0.9 %
Eosinophils Absolute: 103 cells/uL (ref 15–500)
Eosinophils Relative: 1.1 %
HCT: 41.9 % (ref 35.0–45.0)
Hemoglobin: 14.4 g/dL (ref 11.7–15.5)
Lymphs Abs: 2500 cells/uL (ref 850–3900)
MCH: 32.6 pg (ref 27.0–33.0)
MCHC: 34.4 g/dL (ref 32.0–36.0)
MCV: 94.8 fL (ref 80.0–100.0)
MPV: 11 fL (ref 7.5–12.5)
Monocytes Relative: 6.3 %
Neutro Abs: 6119 cells/uL (ref 1500–7800)
Neutrophils Relative %: 65.1 %
Platelets: 310 10*3/uL (ref 140–400)
RBC: 4.42 10*6/uL (ref 3.80–5.10)
RDW: 13 % (ref 11.0–15.0)
Total Lymphocyte: 26.6 %
WBC: 9.4 10*3/uL (ref 3.8–10.8)

## 2022-11-16 LAB — B12 AND FOLATE PANEL
Folate: 9.2 ng/mL
Vitamin B-12: 403 pg/mL (ref 200–1100)

## 2022-11-16 LAB — COMPLETE METABOLIC PANEL WITH GFR
AG Ratio: 1.6 (calc) (ref 1.0–2.5)
ALT: 19 U/L (ref 6–29)
AST: 23 U/L (ref 10–35)
Albumin: 4.1 g/dL (ref 3.6–5.1)
Alkaline phosphatase (APISO): 30 U/L — ABNORMAL LOW (ref 37–153)
BUN/Creatinine Ratio: 14 (calc) (ref 6–22)
BUN: 20 mg/dL (ref 7–25)
CO2: 28 mmol/L (ref 20–32)
Calcium: 9.3 mg/dL (ref 8.6–10.4)
Chloride: 100 mmol/L (ref 98–110)
Creat: 1.46 mg/dL — ABNORMAL HIGH (ref 0.50–1.05)
Globulin: 2.6 g/dL (calc) (ref 1.9–3.7)
Glucose, Bld: 84 mg/dL (ref 65–99)
Potassium: 4.3 mmol/L (ref 3.5–5.3)
Sodium: 137 mmol/L (ref 135–146)
Total Bilirubin: 0.4 mg/dL (ref 0.2–1.2)
Total Protein: 6.7 g/dL (ref 6.1–8.1)
eGFR: 40 mL/min/{1.73_m2} — ABNORMAL LOW (ref >=60)

## 2022-11-16 LAB — TSH: TSH: 1.41 mIU/L (ref 0.40–4.50)

## 2022-11-16 LAB — LIPID PANEL
Cholesterol: 154 mg/dL (ref <200)
HDL: 58 mg/dL (ref >=50)
LDL Cholesterol (Calc): 78 mg/dL (calc)
Non-HDL Cholesterol (Calc): 96 mg/dL (calc) (ref <130)
Total CHOL/HDL Ratio: 2.7 (calc) (ref <5.0)
Triglycerides: 98 mg/dL (ref <150)

## 2022-11-16 LAB — HEMOGLOBIN A1C
Hgb A1c MFr Bld: 5.9 % of total Hgb — ABNORMAL HIGH (ref <5.7)
Mean Plasma Glucose: 123 mg/dL
eAG (mmol/L): 6.8 mmol/L

## 2022-11-16 LAB — VITAMIN D 25 HYDROXY (VIT D DEFICIENCY, FRACTURES): Vit D, 25-Hydroxy: 15 ng/mL — ABNORMAL LOW (ref 30–100)

## 2022-11-16 LAB — IRON,TIBC AND FERRITIN PANEL
%SAT: 21 % (calc) (ref 16–45)
Ferritin: 120 ng/mL (ref 16–288)
Iron: 70 ug/dL (ref 45–160)
TIBC: 337 mcg/dL (calc) (ref 250–450)

## 2022-11-16 LAB — HEPATITIS C ANTIBODY: Hepatitis C Ab: NONREACTIVE

## 2022-11-16 LAB — TEST AUTHORIZATION

## 2022-11-23 MED ORDER — VITAMIN D (ERGOCALCIFEROL) 1.25 MG (50000 UNIT) PO CAPS: 50000.0000 [IU] | ORAL_CAPSULE | ORAL | 0 refills | Status: DC

## 2022-11-23 NOTE — Addendum Note (Signed)
Addended by: [name] on: 11/23/2022 09:41 AM   Modules accepted: Orders

## 2022-11-29 ENCOUNTER — Ambulatory Visit (INDEPENDENT_AMBULATORY_CARE_PROVIDER_SITE_OTHER): Payer: Medicare Other | Admitting: Internal Medicine

## 2022-11-29 VITALS — BP 122/78 | HR 106 | Temp 98.4°F | Resp 16 | Ht 66.0 in | Wt 177.7 lb

## 2022-11-29 DIAGNOSIS — R051 Acute cough: Secondary | ICD-10-CM

## 2022-11-29 DIAGNOSIS — J441 Chronic obstructive pulmonary disease with (acute) exacerbation: Secondary | ICD-10-CM | POA: Diagnosis not present

## 2022-11-29 MED ORDER — PREDNISONE 20 MG PO TABS: 40.0000 mg | ORAL_TABLET | Freq: Every day | ORAL | 0 refills | Status: AC

## 2022-11-29 MED ORDER — BENZONATATE 100 MG PO CAPS: 100.0000 mg | ORAL_CAPSULE | Freq: Two times a day (BID) | ORAL | 0 refills | Status: DC | PRN

## 2022-11-29 MED ORDER — ALBUTEROL SULFATE HFA 108 (90 BASE) MCG/ACT IN AERS: 2.0000 | INHALATION_SPRAY | RESPIRATORY_TRACT | 11 refills | Status: AC | PRN

## 2022-11-29 MED ORDER — DOXYCYCLINE HYCLATE 100 MG PO TABS: 100.0000 mg | ORAL_TABLET | Freq: Two times a day (BID) | ORAL | 0 refills | Status: AC

## 2022-11-29 NOTE — Progress Notes (Signed)
Acute Office Visit  Subjective:     Patient ID: [name], female    DOB: Apr 26, 1956, 66 y.o.   MRN: [id number]  Chief Complaint  Patient presents with   URI    Cough runny nose sore throat heache onset 7 days    HPI Patient is in today for cough, congestion. Using Trelegy, albuterol as needed but feels like the albuterol does not really help.  URI Compliant:  -Fever: no -Cough: yes, productive, thick and yellow  -Shortness of breath: no -Wheezing: yes -Chest tightness: yes -Chest congestion: yes -Nasal congestion: yes -Runny nose: yes -Post nasal drip: yes -Sore throat: yes -Sinus pressure: yes -Face pain: yes forehead  -Ear pain: no  -Ear pressure: no  -Context: worse -Relief with OTC cold/cough medications: no  -Treatments attempted: Mucinex, Dayquil, decongestant     Review of Systems  Constitutional:  Positive for malaise/fatigue. Negative for chills and fever.  HENT:  Positive for congestion and sore throat. Negative for ear pain.   Respiratory:  Positive for cough, sputum production, shortness of breath and wheezing.   Cardiovascular:  Negative for chest pain.        Objective:    BP 122/78   Pulse (!) 106   Temp 98.4 F (36.9 C)   Resp 16   Ht '5\' 6"'$  (1.676 m)   Wt 177 lb 11.2 oz (80.6 kg)   SpO2 90%   BMI 28.68 kg/m  BP Readings from Last 3 Encounters:  11/29/22 122/78  11/15/22 (!) 140/82  07/09/22 (!) 123/59   Wt Readings from Last 3 Encounters:  11/29/22 177 lb 11.2 oz (80.6 kg)  11/15/22 195 lb 3.2 oz (88.5 kg)  07/06/22 165 lb 5.5 oz (75 kg)      Physical Exam Constitutional:      Appearance: Normal appearance.  HENT:     Head: Normocephalic and atraumatic.     Right Ear: Tympanic membrane, ear canal and external ear normal.     Left Ear: Tympanic membrane, ear canal and external ear normal.     Nose: Congestion present.     Mouth/Throat:     Mouth: Mucous membranes are moist.     Pharynx: Oropharynx is clear.  Eyes:      Conjunctiva/sclera: Conjunctivae normal.  Cardiovascular:     Rate and Rhythm: Normal rate and regular rhythm.  Pulmonary:     Effort: Pulmonary effort is normal.     Breath sounds: Wheezing present. No rales.     Comments: Inspiratory and expiratory wheezes throughout poor air movement Skin:    General: Skin is warm and dry.  Neurological:     General: No focal deficit present.     Mental Status: She is alert. Mental status is at baseline.  Psychiatric:        Mood and Affect: Mood normal.        Behavior: Behavior normal.     No results found for any visits on 11/29/22.      Assessment & Plan:   1. COPD exacerbation (HCC)/Acute cough: Symptoms consistent with COPD exacerbation.  Continue Trelegy and albuterol as needed, refilled.  Will treat with prednisone 40 mg x 5 days as well as doxycycline 100 mg twice daily x 7 days.  Continue Mucinex 1200 mg in the morning, cough suppressant at night.  Follow-up if symptoms worsen or fail to improve.  - predniSONE (DELTASONE) 20 MG tablet; Take 2 tablets (40 mg total) by mouth daily with breakfast for  5 days.  Dispense: 10 tablet; Refill: 0 - doxycycline (VIBRA-TABS) 100 MG tablet; Take 1 tablet (100 mg total) by mouth 2 (two) times daily for 7 days.  Dispense: 14 tablet; Refill: 0 - albuterol (VENTOLIN HFA) 108 (90 Base) MCG/ACT inhaler; Inhale 2 puffs into the lungs every 4 (four) hours as needed for wheezing or shortness of breath.  Dispense: 18 g; Refill: 11 - benzonatate (TESSALON) 100 MG capsule; Take 1 capsule (100 mg total) by mouth 2 (two) times daily as needed for cough.  Dispense: 20 capsule; Refill: 0  Return if symptoms worsen or fail to improve.  [name], DO

## 2022-12-02 ENCOUNTER — Other Ambulatory Visit: Payer: Self-pay | Admitting: Internal Medicine

## 2022-12-02 ENCOUNTER — Ambulatory Visit: Payer: Self-pay

## 2022-12-02 DIAGNOSIS — J441 Chronic obstructive pulmonary disease with (acute) exacerbation: Secondary | ICD-10-CM

## 2022-12-02 DIAGNOSIS — R051 Acute cough: Secondary | ICD-10-CM

## 2022-12-02 NOTE — Telephone Encounter (Signed)
Left detailed vm w/ pt

## 2022-12-02 NOTE — Telephone Encounter (Signed)
    Chief Complaint: Seen 11/29/22. Pt. Reports "no better." Still coughing, SOB with exertion. Symptoms: Above, still on Prednisone Frequency: This week Pertinent Negatives: Patient denies fever Disposition: '[]'$ ED /'[]'$ Urgent Care (no appt availability in office) / '[]'$ Appointment(In office/virtual)/ '[]'$  [place] [facility]/ '[]'$ Home Care/ '[]'$ Refused Recommended Disposition /'[]'$ [place] [facility]/ '[x]'$  Follow-up with PCP Additional Notes: States "Dr. Rosana Berger said to call back if I wasn't better and she would do an xray." Please advise pt.  Answer Assessment - Initial Assessment Questions 1. ONSET: "When did the cough begin?"      1 week ago 2. SEVERITY: "How bad is the cough today?"      Severe 3. SPUTUM: "Describe the color of your sputum" (none, dry cough; clear, white, yellow, green)     Yellow 4. HEMOPTYSIS: "Are you coughing up any blood?" If so ask: "How much?" (flecks, streaks, tablespoons, etc.)     No 5. DIFFICULTY BREATHING: "Are you having difficulty breathing?" If Yes, ask: "How bad is it?" (e.g., mild, moderate, severe)    - MILD: No SOB at rest, mild SOB with walking, speaks normally in sentences, can lie down, no retractions, pulse < 100.    - MODERATE: SOB at rest, SOB with minimal exertion and prefers to sit, cannot lie down flat, speaks in phrases, mild retractions, audible wheezing, pulse 100-120.    - SEVERE: Very SOB at rest, speaks in single words, struggling to breathe, sitting hunched forward, retractions, pulse > 120      With exertion 6. FEVER: "Do you have a fever?" If Yes, ask: "What is your temperature, how was it measured, and when did it start?"     No 7. CARDIAC HISTORY: "Do you have any history of heart disease?" (e.g., heart attack, congestive heart failure)      CHF 8. LUNG HISTORY: "Do you have any history of lung disease?"  (e.g., pulmonary embolus, asthma, emphysema)     COPD 9. PE RISK FACTORS: "Do you have a history of blood clots?" (or: recent major  surgery, recent prolonged travel, bedridden)     nO 10. OTHER SYMPTOMS: "Do you have any other symptoms?" (e.g., runny nose, wheezing, chest pain)       Runny nose, wheezing 11. PREGNANCY: "Is there any chance you are pregnant?" "When was your last menstrual period?"       No 12. TRAVEL: "Have you traveled out of the country in the last month?" (e.g., travel history, exposures)       No  Protocols used: Cough - Acute Productive-A-AH

## 2022-12-03 ENCOUNTER — Ambulatory Visit
Admission: RE | Admit: 2022-12-03 | Discharge: 2022-12-03 | Disposition: A | Discharge status: Home / Self Care | Payer: Medicare Other | Source: Ambulatory Visit | Attending: Internal Medicine | Admitting: Internal Medicine

## 2022-12-03 ENCOUNTER — Ambulatory Visit
Admission: RE | Admit: 2022-12-03 | Discharge: 2022-12-03 | Disposition: A | Discharge status: Home / Self Care | Payer: Medicare Other | Attending: Internal Medicine | Admitting: Internal Medicine

## 2022-12-03 DIAGNOSIS — R051 Acute cough: Secondary | ICD-10-CM | POA: Insufficient documentation

## 2022-12-03 DIAGNOSIS — J441 Chronic obstructive pulmonary disease with (acute) exacerbation: Secondary | ICD-10-CM | POA: Diagnosis present

## 2022-12-03 NOTE — Telephone Encounter (Signed)
Pt.notified

## 2022-12-16 ENCOUNTER — Telehealth (INDEPENDENT_AMBULATORY_CARE_PROVIDER_SITE_OTHER): Payer: Medicare Other | Admitting: Internal Medicine

## 2022-12-16 DIAGNOSIS — J441 Chronic obstructive pulmonary disease with (acute) exacerbation: Secondary | ICD-10-CM

## 2022-12-16 MED ORDER — AMOXICILLIN-POT CLAVULANATE 875-125 MG PO TABS: 1.0000 | ORAL_TABLET | Freq: Two times a day (BID) | ORAL | 0 refills | Status: AC

## 2022-12-16 MED ORDER — PREDNISONE 20 MG PO TABS: ORAL_TABLET | ORAL | 0 refills | Status: AC

## 2022-12-16 NOTE — Progress Notes (Signed)
Virtual Visit via Video Note  I connected with [name] on 12/16/22 at  8:00 AM EST by phone and verified that I am speaking with the correct person using two identifiers.  Location: Patient: Home Provider: [facility]   I discussed the limitations of evaluation and management by telephone and the availability of in person appointments. The patient expressed understanding and agreed to proceed.  History of Present Illness:  Patient is presenting over the phone for continued cough and congestion symptoms.  Patient was seen here in the office on 11/29/22 with a COPD exacerbation. She was treated with Prednisone 40 mg x 5 days and Doxycyline x 7 days which she took without issue. Chest x-ray from 12/03/2022 negative for acute pulmonary process, no pneumonia.  Today the patient states that her symptoms are still present.  She still has a productive cough and nasal congestion.  She has occasional shortness of breath and occasional wheezing.  She did see her pulmonologist on 12/09/2022, who extended her doxycycline for total of 17 days.  Respiratory viral panel at that time negative as well.  She is on her Trelegy once a day and albuterol 4 times a day.  She is also on supplemental oxygen 2 L.  She does have her annual lung cancer screening scheduled for 1/24.  She has been taking Mucinex but states this is not helping.  She was also taking a decongestant but states it does not help.  Cough suppressants are not helping her symptoms either.   Observations/Objective:  General: Sick sounding  ENT: Sounds congested over the phone with hoarse voice Pulm: Occasional wet cough Neuro: Answers all questions appropriately   Assessment and Plan:  1. COPD exacerbation [facility]): Switch antibiotic to Augmentin twice daily x 5 days.  Also start long prednisone taper over 9 days.  Over-the-counter medications does not seem to be helping the patient, discussed using 1200 mg Mucinex in the morning, continuing her  inhalers, sleeping with a humidifier and avoiding smoking.  Pulmonology planning for CT scan at the end of the month.  Follow-up if symptoms worsen or fail to improve.  - amoxicillin-clavulanate (AUGMENTIN) 875-125 MG tablet; Take 1 tablet by mouth 2 (two) times daily for 5 days.  Dispense: 10 tablet; Refill: 0 - predniSONE (DELTASONE) 20 MG tablet; Take 2 tablets (40 mg total) by mouth daily with breakfast for 3 days, THEN 1 tablet (20 mg total) daily with breakfast for 3 days, THEN 0.5 tablets (10 mg total) daily with breakfast for 3 days.  Dispense: 10.5 tablet; Refill: 0    I discussed the assessment and treatment plan with the patient. The patient was provided an opportunity to ask questions and all were answered. The patient agreed with the plan and demonstrated an understanding of the instructions.   The patient was advised to call back or seek an in-person evaluation if the symptoms worsen or if the condition fails to improve as anticipated.  I provided 11 minutes of non-face-to-face time during this encounter.   [name], DO

## 2022-12-17 DIAGNOSIS — J449 Chronic obstructive pulmonary disease, unspecified: Secondary | ICD-10-CM | POA: Diagnosis not present

## 2022-12-22 ENCOUNTER — Telehealth (INDEPENDENT_AMBULATORY_CARE_PROVIDER_SITE_OTHER): Payer: Medicare Other | Admitting: Physician Assistant

## 2022-12-22 DIAGNOSIS — Z13820 Encounter for screening for osteoporosis: Secondary | ICD-10-CM

## 2022-12-22 DIAGNOSIS — Z720 Tobacco use: Secondary | ICD-10-CM | POA: Diagnosis not present

## 2022-12-22 DIAGNOSIS — Z0000 Encounter for general adult medical examination without abnormal findings: Secondary | ICD-10-CM | POA: Diagnosis not present

## 2022-12-22 NOTE — Patient Instructions (Signed)
[name] , Thank you for taking time to come for your Medicare Wellness Visit. I appreciate your ongoing commitment to your health goals. Please review the following plan we discussed and let me know if I can assist you in the future.   Screening recommendations/referrals: Colonoscopy: Completed on 06/17/2022. Repeat every 10 years  Mammogram: Completed 08/31/2022 - repeat in 2025  Bone Density: This is due, you can usually schedule this with your Mammogram if desired. Please call the [facility] to schedule this  Recommended yearly ophthalmology/optometry visit for glaucoma screening and checkup Recommended yearly dental visit for hygiene and checkup  Vaccinations: Influenza vaccine: Completed for 2023 season  Pneumococcal vaccine: Completed  Tdap vaccine: You have elected not to get these boosters. Please speak to your PCP if you change your mind  Shingles vaccine: Please speak to your PCP or get this at your pharmacy to protect against Shingles   Covid-19: Please stay up to date on the vaccine and booster recommendations for your age group.   Advanced directives: Information provided today   Conditions/risks identified: None   Next appointment: Follow up in one year for your annual wellness visit    Preventive Care 65 Years and Older, Female Preventive care refers to lifestyle choices and visits with your health care provider that can promote health and wellness. What does preventive care include? A yearly physical exam. This is also called an annual well check. Dental exams once or twice a year. Routine eye exams. Ask your health care provider how often you should have your eyes checked. Personal lifestyle choices, including: Daily care of your teeth and gums. Regular physical activity. Eating a healthy diet. Avoiding tobacco and drug use. Limiting alcohol use. Practicing safe sex. Taking low-dose aspirin every day. Taking vitamin and mineral supplements as  recommended by your health care provider. What happens during an annual well check? The services and screenings done by your health care provider during your annual well check will depend on your age, overall health, lifestyle risk factors, and family history of disease. Counseling  Your health care provider may ask you questions about your: Alcohol use. Tobacco use. Drug use. Emotional well-being. Home and relationship well-being. Sexual activity. Eating habits. History of falls. Memory and ability to understand (cognition). Work and work Statistician. Reproductive health. Screening  You may have the following tests or measurements: Height, weight, and BMI. Blood pressure. Lipid and cholesterol levels. These may be checked every 5 years, or more frequently if you are over 59 years old. Skin check. Lung cancer screening. You may have this screening every year starting at age 68 if you have a 30-pack-year history of smoking and currently smoke or have quit within the past 15 years. Fecal occult blood test (FOBT) of the stool. You may have this test every year starting at age 21. Flexible sigmoidoscopy or colonoscopy. You may have a sigmoidoscopy every 5 years or a colonoscopy every 10 years starting at age 5. Hepatitis C blood test. Hepatitis B blood test. Sexually transmitted disease (STD) testing. Diabetes screening. This is done by checking your blood sugar (glucose) after you have not eaten for a while (fasting). You may have this done every 1-3 years. Bone density scan. This is done to screen for osteoporosis. You may have this done starting at age 29. Mammogram. This may be done every 1-2 years. Talk to your health care provider about how often you should have regular mammograms. Talk with your health care provider about your  test results, treatment options, and if necessary, the need for more tests. Vaccines  Your health care provider may recommend certain vaccines, such  as: Influenza vaccine. This is recommended every year. Tetanus, diphtheria, and acellular pertussis (Tdap, Td) vaccine. You may need a Td booster every 10 years. Zoster vaccine. You may need this after age 76. Pneumococcal 13-valent conjugate (PCV13) vaccine. One dose is recommended after age 24. Pneumococcal polysaccharide (PPSV23) vaccine. One dose is recommended after age 20. Talk to your health care provider about which screenings and vaccines you need and how often you need them. This information is not intended to replace advice given to you by your health care provider. Make sure you discuss any questions you have with your health care provider. Document Released: 12/26/2015 Document Revised: 08/18/2016 Document Reviewed: 09/30/2015 Elsevier Interactive Patient Education  2017 [employer] Prevention in the Home Falls can cause injuries. They can happen to people of all ages. There are many things you can do to make your home safe and to help prevent falls. What can I do on the outside of my home? Regularly fix the edges of walkways and driveways and fix any cracks. Remove anything that might make you trip as you walk through a door, such as a raised step or threshold. Trim any bushes or trees on the path to your home. Use bright outdoor lighting. Clear any walking paths of anything that might make someone trip, such as rocks or tools. Regularly check to see if handrails are loose or broken. Make sure that both sides of any steps have handrails. Any raised decks and porches should have guardrails on the edges. Have any leaves, snow, or ice cleared regularly. Use sand or salt on walking paths during winter. Clean up any spills in your garage right away. This includes oil or grease spills. What can I do in the bathroom? Use night lights. Install grab bars by the toilet and in the tub and shower. Do not use towel bars as grab bars. Use non-skid mats or decals in the tub or  shower. If you need to sit down in the shower, use a plastic, non-slip stool. Keep the floor dry. Clean up any water that spills on the floor as soon as it happens. Remove soap buildup in the tub or shower regularly. Attach bath mats securely with double-sided non-slip rug tape. Do not have throw rugs and other things on the floor that can make you trip. What can I do in the bedroom? Use night lights. Make sure that you have a light by your bed that is easy to reach. Do not use any sheets or blankets that are too big for your bed. They should not hang down onto the floor. Have a firm chair that has side arms. You can use this for support while you get dressed. Do not have throw rugs and other things on the floor that can make you trip. What can I do in the kitchen? Clean up any spills right away. Avoid walking on wet floors. Keep items that you use a lot in easy-to-reach places. If you need to reach something above you, use a strong step stool that has a grab bar. Keep electrical cords out of the way. Do not use floor polish or wax that makes floors slippery. If you must use wax, use non-skid floor wax. Do not have throw rugs and other things on the floor that can make you trip. What can I do with my  stairs? Do not leave any items on the stairs. Make sure that there are handrails on both sides of the stairs and use them. Fix handrails that are broken or loose. Make sure that handrails are as long as the stairways. Check any carpeting to make sure that it is firmly attached to the stairs. Fix any carpet that is loose or worn. Avoid having throw rugs at the top or bottom of the stairs. If you do have throw rugs, attach them to the floor with carpet tape. Make sure that you have a light switch at the top of the stairs and the bottom of the stairs. If you do not have them, ask someone to add them for you. What else can I do to help prevent falls? Wear shoes that: Do not have high heels. Have  rubber bottoms. Are comfortable and fit you well. Are closed at the toe. Do not wear sandals. If you use a stepladder: Make sure that it is fully opened. Do not climb a closed stepladder. Make sure that both sides of the stepladder are locked into place. Ask someone to hold it for you, if possible. Clearly mark and make sure that you can see: Any grab bars or handrails. First and last steps. Where the edge of each step is. Use tools that help you move around (mobility aids) if they are needed. These include: Canes. Walkers. Scooters. Crutches. Turn on the lights when you go into a dark area. Replace any light bulbs as soon as they burn out. Set up your furniture so you have a clear path. Avoid moving your furniture around. If any of your floors are uneven, fix them. If there are any pets around you, be aware of where they are. Review your medicines with your doctor. Some medicines can make you feel dizzy. This can increase your chance of falling. Ask your doctor what other things that you can do to help prevent falls. This information is not intended to replace advice given to you by your health care provider. Make sure you discuss any questions you have with your health care provider. Document Released: 09/25/2009 Document Revised: 05/06/2016 Document Reviewed: 01/03/2015 Elsevier Interactive Patient Education  2017 [employer].

## 2022-12-22 NOTE — Progress Notes (Signed)
Annual Wellness Visit   Virtual Visit via Video Note  I connected with [name] on 12/22/22 at 11:20 AM EST by a video enabled telemedicine application and verified that I am speaking with the correct person using two identifiers. Today's Provider: Talitha Givens, MHS, PA-C Introduced myself to the patient as a PA-C and provided education on APPs in clinical practice.    Location: Patient: At home  Provider: [place], [place]    I discussed the limitations of evaluation and management by telemedicine and the availability of in person appointments. The patient expressed understanding and agreed to proceed.    Subjective:   [name] is a 67 y.o. female who presents for Medicare Annual (Subsequent) preventive examination.  Review of Systems:       Objective:     Vitals: There were no vitals taken for this visit.  There is no height or weight on file to calculate BMI.     07/07/2022    8:00 AM 07/07/2022    7:24 AM 06/18/2022    2:00 PM 06/17/2022    1:04 PM 06/07/2022    1:30 PM 03/04/2022   12:42 PM 11/17/2020    8:44 AM  Advanced Directives  Does Patient Have a Medical Advance Directive? No No No No No No No  Would patient like information on creating a medical advance directive? No - Patient declined  No - Patient declined  Yes (MAU/Ambulatory/Procedural Areas - Information given) No - Patient declined No - Patient declined    Tobacco Social History   Tobacco Use  Smoking Status Every Day   Packs/day: 1.00   Years: 48.00   Total pack years: 48.00   Types: Cigarettes  Smokeless Tobacco Never     Ready to quit: Not Answered Counseling given: Not Answered   Clinical Intake:  Pre-visit preparation completed: Yes  Pain : 0-10 Pain Score: 6  Pain Type: Chronic pain Pain Location: Hip Pain Descriptors / Indicators: Constant Pain Onset: More than a month ago Pain Frequency: Intermittent     Nutritional Status: BMI 25 -29  Overweight Nutritional Risks: None Diabetes: Yes CBG done?: No Did pt. bring in CBG monitor from home?: No  How often do you need to have someone help you when you read instructions, pamphlets, or other written materials from your doctor or pharmacy?: 1 - Never What is the last grade level you completed in school?: GED  Interpreter Needed?: No     Past Medical History:  Diagnosis Date   (HFpEF) heart failure with preserved ejection fraction (HCC)    Acute renal failure ([place]) 03/29/2011   Acute respiratory distress 11/2020   Acute respiratory failure ([place]) 04/14/2014   Anxiety    Arthritis    CHF (congestive heart failure) ([place])    Chronic respiratory failure with hypoxia and hypercapnia ([place])    Community acquired pneumonia 01/10/2013   COPD (chronic obstructive pulmonary disease) (HCC)    no inhalers--smoker, no oxygen   Depression    Dyspnea    Emphysema lung (HCC)    GERD (gastroesophageal reflux disease)    Hyperlipidemia    Hypertension    Hypothyroid    Peripheral vascular disease ([name])    Pre-diabetes    Restless leg syndrome    Seizures ([place])    02/13/2011 -hx of seizure due to "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  no seizure since--pt does take keppra and office note from neurologist dr. Leta Baptist on this chart   Urinary incontinence    Past Surgical History:  Procedure Laterality Date   CARPAL TUNNEL RELEASE     left   COLONOSCOPY WITH PROPOFOL N/A 06/17/2022   Procedure: COLONOSCOPY WITH PROPOFOL;  Surgeon: Lesly Rubenstein, MD;  Location: [facility] ENDOSCOPY;  Service: Endoscopy;  Laterality: N/A;   JOINT REPLACEMENT  08/26/2011   right total knee arthroplasty, left total knee   KNEE ARTHROPLASTY  10/28/2011   Procedure: COMPUTER ASSISTED TOTAL KNEE ARTHROPLASTY;  Surgeon: Karen Chafe Rendall III;  Location: [facility];  Service: Orthopedics;  Laterality: Left;  preop  femoral nerve block   KNEE SURGERY     right   laminectomy and diskectomy     L4-5 with fusion   PROCTOSCOPY N/A 06/18/2022   Procedure: RIGID PROCTOSCOPY;  Surgeon: Michael Boston, MD;  Location: [facility];  Service: General;  Laterality: N/A;   Family History  Problem Relation Age of Onset   Heart attack Father 78   Alzheimer's disease Mother    Breast cancer Neg Hx    Social History   Socioeconomic History   Marital status: Widowed    Spouse name: Not on file   Number of children: Not on file   Years of education: Not on file   Highest education level: Not on file  Occupational History   Not on file  Tobacco Use   Smoking status: Every Day    Packs/day: 1.00    Years: 48.00    Total pack years: 48.00    Types: Cigarettes   Smokeless tobacco: Never  Vaping Use   Vaping Use: Every day   Substances: Nicotine  Substance and Sexual Activity   Alcohol use: Yes    Comment: occasional   Drug use: No   Sexual activity: Yes    Birth control/protection: Post-menopausal  Other Topics Concern   Not on file  Social History Narrative   ** Merged History Encounter **       Social Determinants of Health   Financial Resource Strain: Not on file  Food Insecurity: No Food Insecurity (11/18/2020)   Hunger Vital Sign    Worried About Running Out of Food in the Last Year: Never true    Ran Out of Food in the Last Year: Never true  Transportation Needs: No Transportation Needs (11/18/2020)   PRAPARE - Hydrologist (Medical): No    Lack of Transportation (Non-Medical): No  Physical Activity: Not on file  Stress: Not on file  Social Connections: Not on file    Outpatient Encounter Medications as of 12/22/2022  Medication Sig   acidophilus (RISAQUAD) CAPS capsule Take 1 capsule by mouth daily.   albuterol (VENTOLIN HFA) 108 (90 Base) MCG/ACT inhaler Inhale 2 puffs into the lungs every 4 (four) hours as needed for wheezing or shortness of breath.    amLODipine (NORVASC) 5 MG tablet Take 7.5 mg by mouth daily.   ARIPiprazole (ABILIFY) 10 MG tablet Take 10 mg by mouth daily.   benzonatate (TESSALON) 100 MG capsule Take 1 capsule (100 mg total) by mouth 2 (two) times daily as needed for cough.   escitalopram (LEXAPRO) 20 MG tablet Take 30 mg by mouth daily.   famotidine (PEPCID) 40 MG tablet Take 40 mg by mouth at bedtime.   fenofibrate micronized (LOFIBRA) 134 MG capsule Take 134 mg by mouth daily before  breakfast.    Fluticasone-Umeclidin-Vilant (TRELEGY ELLIPTA) 100-62.5-25 MCG/ACT AEPB Inhale 1 puff into the lungs daily.   gabapentin (NEURONTIN) 800 MG tablet Take 800 mg by mouth 3 (three) times daily.    hydrOXYzine (ATARAX) 25 MG tablet Take 25 mg by mouth 4 (four) times daily as needed.   levothyroxine (SYNTHROID, LEVOTHROID) 25 MCG tablet Take 25 mcg by mouth daily before breakfast.    losartan (COZAAR) 100 MG tablet Take 100 mg by mouth daily.    OXYGEN Inhale 3 L into the lungs at bedtime.   pantoprazole (PROTONIX) 40 MG tablet Take 40 mg by mouth every morning.    pramipexole (MIRAPEX) 0.5 MG tablet Take 0.5 mg by mouth at bedtime.   predniSONE (DELTASONE) 20 MG tablet Take 2 tablets (40 mg total) by mouth daily with breakfast for 3 days, THEN 1 tablet (20 mg total) daily with breakfast for 3 days, THEN 0.5 tablets (10 mg total) daily with breakfast for 3 days.   Vibegron (GEMTESA) 75 MG TABS Take 75 mg by mouth daily.   Vitamin D, Ergocalciferol, (DRISDOL) 1.25 MG (50000 UNIT) CAPS capsule Take 1 capsule (50,000 Units total) by mouth every 7 (seven) days.   furosemide (LASIX) 40 MG tablet Take 1 tablet (40 mg total) by mouth daily. May take additional 40 mg (one tablet) as needed for swelling, weight gain or sob   SYMBICORT 160-4.5 MCG/ACT inhaler Inhale 2 puffs into the lungs 2 (two) times daily.   Facility-Administered Encounter Medications as of 12/22/2022  Medication   chlorhexidine (HIBICLENS) 4 % liquid 4 application    chlorhexidine (HIBICLENS) 4 % liquid 4 application    Activities of Daily Living    12/22/2022   11:39 AM 12/22/2022   10:50 AM  In your present state of health, do you have any difficulty performing the following activities:  Hearing? 0 0  Vision? 1 0  Difficulty concentrating or making decisions? 1 0  Comment She admits to memory concerns   Walking or climbing stairs? 1 0  Dressing or bathing? 0 0  Doing errands, shopping? 0 0  Preparing Food and eating ? N   Using the Toilet? N   In the past six months, have you accidently leaked urine? Y   Do you have problems with loss of bowel control? Y   Managing your Medications? N   Managing your Finances? N   Housekeeping or managing your Housekeeping? Y     Patient Care Team: [name], DO as PCP - General (Internal Medicine) Chesley Mires, MD as Consulting Physician (Pulmonary Disease) Minus Breeding, MD as Consulting Physician (Cardiology) Alexis Goodell, MD as Consulting Physician (Neurology) Gillis Santa, MD as Consulting Physician (Pain Medicine) Marty Heck, MD as Consulting Physician (Vascular Surgery) Lesly Rubenstein, MD as Consulting Physician (Gastroenterology) Marti Sleigh, MD as Referring Physician (Urology) Erby Pian, MD as Referring Physician (Pulmonary Disease) Michael Boston, MD as Consulting Physician (Colon and Rectal Surgery) Lesly Rubenstein, MD as Consulting Physician (Gastroenterology)    Assessment:   This is a routine wellness examination for [name].  Exercise Activities and Dietary recommendations Exercise limited by: respiratory conditions(s)   Goals Addressed   None     Fall Risk:    12/22/2022   10:50 AM 12/16/2022    8:01 AM 11/29/2022    2:46 PM 11/15/2022    2:21 PM 08/23/2018   12:00 PM  [place] in the past year? 0 0 0 1  Number falls in past yr: 0 0 0 1   Injury with Fall? 0 0 0 1   Risk for fall due to : Impaired balance/gait No  Fall Risks  Impaired balance/gait;Impaired mobility   Follow up Falls prevention discussed;Education provided;Falls evaluation completed Falls prevention discussed;Education provided;Falls evaluation completed        Information is confidential and restricted. Go to Review Flowsheets to unlock data.    FALL RISK PREVENTION PERTAINING TO THE HOME:  Any stairs in or around the home? No  If so, are there any without handrails? No   Home free of loose throw rugs in walkways, pet beds, electrical cords, etc? Yes  Adequate lighting in your home to reduce risk of falls? Yes   ASSISTIVE DEVICES UTILIZED TO PREVENT FALLS:  Life alert? No  Use of a cane, walker or w/c? Yes  Grab bars in the bathroom? No  Shower chair or bench in shower? No  Elevated toilet seat or a handicapped toilet? No   DME ORDERS:  DME order needed?  No   TIMED UP AND GO:  Was the test performed?  Video visit .  Length of time to ambulate 10 feet: 0 sec.      Depression Screen    12/22/2022   10:51 AM 12/16/2022    8:01 AM 11/29/2022    2:46 PM 11/15/2022    2:21 PM  PHQ 2/9 Scores  PHQ - 2 Score 0 0 0 1  PHQ- 9 Score 0 0 0      Cognitive Function        Immunization History  Administered Date(s) Administered   Influenza Inj Mdck Quad Pf 08/08/2018   Influenza-Unspecified 09/19/2019, 09/10/2022   Moderna Sars-Covid-2 Vaccination 03/24/2020, 04/21/2020   PNEUMOCOCCAL CONJUGATE-20 11/15/2022   Pneumococcal Conjugate-13 01/16/2020   Td 07/09/2012    Qualifies for Shingles Vaccine? Yes  . Due for Shingrix. Education has been provided regarding the importance of this vaccine. Pt has been advised to call insurance company to determine out of pocket expense. Advised may also receive vaccine at local pharmacy or Health Dept. Verbalized acceptance and understanding.  Tdap: Although this vaccine is not a covered service during a Wellness Exam, does the patient still wish to receive this vaccine today?  Yes  .  Education has been provided regarding the importance of this vaccine. Advised may receive this vaccine at local pharmacy or Health Dept. Aware to provide a copy of the vaccination record if obtained from local pharmacy or Health Dept. Verbalized acceptance and understanding.  Flu Vaccine: Up to date  Pneumococcal Vaccine: Up to date  Covid-19 Vaccine: Completed vaccines  Screening Tests Health Maintenance  Topic Date Due   FOOT EXAM  Never done   OPHTHALMOLOGY EXAM  Never done   Diabetic kidney evaluation - Urine ACR  Never done   PAP SMEAR-Modifier  Never done   Medicare Annual Wellness (AWV)  10/24/2019   Lung Cancer Screening  12/27/2020   COVID-19 Vaccine (3 - 2023-24 season) 08/13/2022   Zoster Vaccines- Shingrix (1 of 2) 03/23/2023 (Originally 03/17/2007)   HEMOGLOBIN A1C  05/17/2023   Diabetic kidney evaluation - eGFR measurement  11/16/2023   MAMMOGRAM  08/31/2024   COLONOSCOPY (Pts 45-65yr Insurance coverage will need to be confirmed)  06/17/2032   Pneumonia Vaccine 66 Years old  Completed   INFLUENZA VACCINE  Completed   DEXA SCAN  Completed   Hepatitis C Screening  Completed   HIV Screening  Completed  HPV VACCINES  Aged Out   DTaP/Tdap/Td  Discontinued    Cancer Screenings:  Colorectal Screening: Completed 06/17/2022. Repeat every 10 years  Mammogram: Completed 08/31/2022. Repeat 2 year  Bone Density: Completed 05/07/2015. Results reflect NORMAL  Lung Cancer Screening: (Low Dose CT Chest recommended if Age 72-80 years, 30 pack-year currently smoking OR have quit w/in 15years.) does qualify. Order placed today.   Lung Cancer Screening Referral: An Epic message has been sent to Burgess Estelle, RN (Oncology Nurse Navigator) regarding the possible need for this exam. Raquel Sarna will review the patient's chart to determine if the patient truly qualifies for the exam. If the patient qualifies, Raquel Sarna will order the Low Dose CT of the chest to facilitate the scheduling of  this exam.  Additional Screening:  Hepatitis C Screening: does qualify; Completed 11/15/2022  Vision Screening: Recommended annual ophthalmology exams for early detection of glaucoma and other disorders of the eye. Is the patient up to date with their annual eye exam?  Yes  Who is the provider or what is the name of the office in which the pt attends annual eye exams? [facility]   Dental Screening: Recommended annual dental exams for proper oral hygiene  Community Resource Referral:  CRR required this visit?  No       Plan:  I have personally reviewed and addressed the Medicare Annual Wellness questionnaire and have noted the following in the patient's chart:  A. Medical and social history B. Use of alcohol, tobacco or illicit drugs  C. Current medications and supplements D. Functional ability and status E.  Nutritional status F.  Physical activity G. Advance directives H. List of other physicians I.  Hospitalizations, surgeries, and ER visits in previous 12 months J.  [place] such as hearing and vision if needed, cognitive and depression L. Referrals and appointments   In addition, I have reviewed and discussed with patient certain preventive protocols, quality metrics, and best practice recommendations. A written personalized care plan for preventive services as well as general preventive health recommendations were provided to patient.  Signed,    Talitha Givens, MHS, PA-C [facility]

## 2022-12-23 ENCOUNTER — Ambulatory Visit: Payer: 59

## 2022-12-31 DIAGNOSIS — H524 Presbyopia: Secondary | ICD-10-CM | POA: Diagnosis not present

## 2022-12-31 DIAGNOSIS — H2513 Age-related nuclear cataract, bilateral: Secondary | ICD-10-CM | POA: Diagnosis not present

## 2022-12-31 DIAGNOSIS — E119 Type 2 diabetes mellitus without complications: Secondary | ICD-10-CM | POA: Diagnosis not present

## 2022-12-31 LAB — HM DIABETES EYE EXAM

## 2023-01-03 NOTE — Progress Notes (Deleted)
Established Patient Office Visit  Subjective    Patient ID: MALI EPPARD, female    DOB: [birth year]  Age: 67 y.o. MRN: [id number]  CC:  Chief Complaint  Patient presents with   Establish Care    Pt see/ cardio, ortho and pulmonolgy    HPI NITI LEISURE presents to follow up on chronic medical conditions and for insomnia.  INSOMNIA Duration: chronic Satisfied with sleep quality: no Difficulty falling asleep: no Difficulty staying asleep: yes Waking a few hours after sleep onset: yes Early morning awakenings: {Blank single:19197::"yes","no"} Daytime hypersomnolence: {Blank single:19197::"yes","no"} Wakes feeling refreshed: {Blank single:19197::"yes","no"} Good sleep hygiene: {Blank single:19197::"yes","no"} Apnea: {Blank single:19197::"yes","no"} Snoring: {Blank single:19197::"yes","no"} Depressed/anxious mood: {Blank single:19197::"yes","no"} Recent stress: {Blank single:19197::"yes","no"} Restless legs/nocturnal leg cramps: {Blank single:19197::"yes","no"} Chronic pain/arthritis: {Blank single:19197::"yes","no"} History of sleep study: {Blank single:19197::"yes","no"} Treatments attempted: {Blank multiple:19196::"none","melatonin","uinsom","benadryl","ambien"}   Hypertension/HFpEF: -Medications: Losartan 100 mg, Lasix 40 mg, amlodipine 5 mg -Patient is compliant with above medications and reports no side effects. -Denies any SOB, CP, vision changes, LE edema or symptoms of hypotension -Last echo 12/29/2019 with an EF of 60 to 65%  HLD: -Medications: Fenofibrate 134 mg -Patient is compliant with above medications and reports no side effects.  -Last lipid panel: Lipid Panel     Component Value Date/Time   CHOL 154 11/15/2022 1510   TRIG 98 11/15/2022 1510   HDL 58 11/15/2022 1510   CHOLHDL 2.7 11/15/2022 1510   VLDL 13 03/13/2010 0214   LDLCALC 78 11/15/2022 1510    COPD: -COPD status: stable -Current medications: Trelegy, Albuterol  -Satisfied with current  treatment?: yes -Oxygen use: no -Dyspnea frequency: with activity  -Cough frequency: daily  -Rescue inhaler frequency:  rarely  -Limitation of activity: yes -Pneumovax: Not up to Date -Influenza: Up to Date  Hypothyroidism -Medications: Levothyroxine 25 mcg -Patient is compliant with the above medication (s) at the above dose and reports no medication side effects.  -Denies weight changes, cold./heat intolerance, skin changes, anxiety/palpitations  -Last TSH: 2021 1.240  MDD: -Mood status: stable -Current treatment: Lexapro 20 mg, Abilify 10 mg, hydroxyzine PRN -Satisfied with current treatment?: yes -Duration of current treatment : years -Side effects: no Medication compliance: excellent compliance     11/15/2022    2:21 PM 08/23/2018   12:00 PM 08/02/2018   11:43 AM 07/20/2018   11:35 AM  Depression screen PHQ 2/9  Decreased Interest 1     Down, Depressed, Hopeless      PHQ - 2 Score 1        Information is confidential and restricted. Go to Review Flowsheets to unlock data.   Prediabetes: -Last A1c 1 year ago 5.2%, highest A1c in chart 5.8%. -Does have a history of gestational diabetes -Not currently on any medications  History of seizures: -Had 1 seizure in 2012, had been on Keppra but is not on currently.  She is not on any medications for seizures currently.  She has not had any seizures since the one incident in 2012  GERD: -Currently on Protonix 40 mg and Pepcid 40 mg  Outpatient Encounter Medications as of 11/15/2022  Medication Sig   acidophilus (RISAQUAD) CAPS capsule Take 1 capsule by mouth daily.   albuterol (VENTOLIN HFA) 108 (90 Base) MCG/ACT inhaler Inhale 2 puffs into the lungs every 4 (four) hours as needed for wheezing or shortness of breath.   amLODipine (NORVASC) 5 MG tablet Take 7.5 mg by mouth daily.   ARIPiprazole (ABILIFY) 10 MG tablet Take 10 mg by mouth  daily.   escitalopram (LEXAPRO) 20 MG tablet Take 30 mg by mouth daily.   famotidine  (PEPCID) 40 MG tablet Take 40 mg by mouth at bedtime.   fenofibrate micronized (LOFIBRA) 134 MG capsule Take 134 mg by mouth daily before breakfast.    Fluticasone-Umeclidin-Vilant (TRELEGY ELLIPTA) 100-62.5-25 MCG/ACT AEPB Inhale 1 puff into the lungs daily.   furosemide (LASIX) 40 MG tablet Take 1 tablet (40 mg total) by mouth daily. May take additional 40 mg (one tablet) as needed for swelling, weight gain or sob   gabapentin (NEURONTIN) 800 MG tablet Take 800 mg by mouth 3 (three) times daily.    hydrOXYzine (ATARAX) 25 MG tablet Take 25 mg by mouth 4 (four) times daily as needed.   levothyroxine (SYNTHROID, LEVOTHROID) 25 MCG tablet Take 25 mcg by mouth daily before breakfast.    losartan (COZAAR) 100 MG tablet Take 100 mg by mouth daily.    OXYGEN Inhale 3 L into the lungs at bedtime.   pantoprazole (PROTONIX) 40 MG tablet Take 40 mg by mouth every morning.    pramipexole (MIRAPEX) 0.5 MG tablet Take 0.5 mg by mouth at bedtime.   predniSONE (DELTASONE) 10 MG tablet Take 1 tablet (10 mg total) by mouth daily with breakfast. Hold this medication until you complete prednisone '40mg'$  daily x 5 days.   SYMBICORT 160-4.5 MCG/ACT inhaler Inhale 2 puffs into the lungs 2 (two) times daily.   Vibegron (GEMTESA) 75 MG TABS Take 75 mg by mouth daily.   [DISCONTINUED] aspirin 81 MG EC tablet Take 81 mg by mouth daily.  (Patient not taking: Reported on 07/06/2022)   [DISCONTINUED] busPIRone (BUSPAR) 30 MG tablet Take 30 mg by mouth 2 (two) times daily.   [DISCONTINUED] cetirizine (ZYRTEC) 10 MG tablet Take 10 mg by mouth daily as needed for allergies or rhinitis.   [DISCONTINUED] fluticasone (FLONASE) 50 MCG/ACT nasal spray Place 1 spray into both nostrils daily as needed for allergies or rhinitis.   [DISCONTINUED] levETIRAcetam (KEPPRA) 500 MG tablet Take 1 tablet (500 mg total) by mouth 2 (two) times daily. (Patient taking differently: Take 500 mg by mouth daily.)   [DISCONTINUED] loperamide (IMODIUM) 2 MG  capsule Take 12 mg by mouth as needed for diarrhea or loose stools.   [DISCONTINUED] Menthol, Topical Analgesic, (BIOFREEZE) 10 % CREA Apply 1 Application topically daily as needed (pain).   [DISCONTINUED] Multiple Vitamins-Minerals (CENTRUM SILVER 50+WOMEN) TABS Take 1 tablet by mouth daily.   [DISCONTINUED] nortriptyline (PAMELOR) 75 MG capsule Take 75 mg by mouth at bedtime.   [DISCONTINUED] oxyCODONE (OXY IR/ROXICODONE) 5 MG immediate release tablet Take 5 mg by mouth every 6 (six) hours as needed.   [DISCONTINUED] traMADol (ULTRAM) 50 MG tablet Take 1-2 tablets (50-100 mg total) by mouth every 6 (six) hours as needed for moderate pain or severe pain.   [DISCONTINUED] umeclidinium-vilanterol (ANORO ELLIPTA) 62.5-25 MCG/INH AEPB Inhale 1 puff into the lungs daily. (Patient not taking: Reported on 06/01/2022)   Facility-Administered Encounter Medications as of 11/15/2022  Medication   chlorhexidine (HIBICLENS) 4 % liquid 4 application   chlorhexidine (HIBICLENS) 4 % liquid 4 application    Past Medical History:  Diagnosis Date   (HFpEF) heart failure with preserved ejection fraction (HCC)    Acute renal failure ([place]) 03/29/2011   Acute respiratory distress 11/2020   Acute respiratory failure ([place]) 04/14/2014   Anxiety    Arthritis    CHF (congestive heart failure) (HCC)    Chronic respiratory failure with hypoxia and hypercapnia (  [place])    Community acquired pneumonia 01/10/2013   COPD (chronic obstructive pulmonary disease) (HCC)    no inhalers--smoker, no oxygen   Depression    Dyspnea    Emphysema lung (HCC)    GERD (gastroesophageal reflux disease)    Hyperlipidemia    Hypertension    Hypothyroid    Peripheral vascular disease ([place])    Pre-diabetes    Restless leg syndrome    Seizures ([place])    02/13/2011 -hx of seizure due to "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  no seizure  since--pt does take keppra and office note from neurologist dr. Leta Baptist on this chart   Urinary incontinence     Past Surgical History:  Procedure Laterality Date   CARPAL TUNNEL RELEASE     left   COLONOSCOPY WITH PROPOFOL N/A 06/17/2022   Procedure: COLONOSCOPY WITH PROPOFOL;  Surgeon: Lesly Rubenstein, MD;  Location: [facility] ENDOSCOPY;  Service: Endoscopy;  Laterality: N/A;   JOINT REPLACEMENT  08/26/2011   right total knee arthroplasty, left total knee   KNEE ARTHROPLASTY  10/28/2011   Procedure: COMPUTER ASSISTED TOTAL KNEE ARTHROPLASTY;  Surgeon: Karen Chafe Rendall III;  Location: [facility];  Service: Orthopedics;  Laterality: Left;  preop femoral nerve block   KNEE SURGERY     right   laminectomy and diskectomy     L4-5 with fusion   PROCTOSCOPY N/A 06/18/2022   Procedure: RIGID PROCTOSCOPY;  Surgeon: Michael Boston, MD;  Location: [facility];  Service: General;  Laterality: N/A;    Family History  Problem Relation Age of Onset   Heart attack Father 98   Alzheimer's disease Mother    Breast cancer Neg Hx     Social History   Socioeconomic History   Marital status: Widowed    Spouse name: Not on file   Number of children: Not on file   Years of education: Not on file   Highest education level: Not on file  Occupational History   Not on file  Tobacco Use   Smoking status: Every Day    Packs/day: 1.00    Years: 48.00    Total pack years: 48.00    Types: Cigarettes   Smokeless tobacco: Never  Vaping Use   Vaping Use: Every day   Substances: Nicotine  Substance and Sexual Activity   Alcohol use: Yes    Comment: occasional   Drug use: No   Sexual activity: Yes    Birth control/protection: Post-menopausal  Other Topics Concern   Not on file  Social History Narrative   ** Merged History Encounter **       Social Determinants of Health   Financial Resource Strain: Not on file  Food Insecurity: No Food Insecurity (11/18/2020)   Hunger Vital Sign    Worried About  Running Out of Food in the Last Year: Never true    Ran Out of Food in the Last Year: Never true  Transportation Needs: No Transportation Needs (11/18/2020)   PRAPARE - Hydrologist (Medical): No    Lack of Transportation (Non-Medical): No  Physical Activity: Not on file  Stress: Not on file  Social Connections: Not on file  Intimate Partner Violence: Not on file    Review of Systems  All other systems reviewed and are negative.       Objective    BP (!) 140/82  Pulse (!) 104   Temp 98.4 F (36.9 C)   Resp 18   Ht '5\' 6"'$  (1.676 m)   Wt 195 lb 3.2 oz (88.5 kg)   SpO2 92%   BMI 31.51 kg/m   Physical Exam Constitutional:      Appearance: Normal appearance.  HENT:     Head: Normocephalic and atraumatic.     Mouth/Throat:     Mouth: Mucous membranes are moist.     Pharynx: Oropharynx is clear.  Eyes:     Conjunctiva/sclera: Conjunctivae normal.  Cardiovascular:     Rate and Rhythm: Normal rate and regular rhythm.  Pulmonary:     Effort: Pulmonary effort is normal.     Breath sounds: Normal breath sounds.     Comments: Poor air movement throughout Musculoskeletal:     Right lower leg: No edema.     Left lower leg: No edema.  Skin:    General: Skin is warm and dry.     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     Mental Status: She is alert. Mental status is at baseline.  Psychiatric:        Mood and Affect: Mood normal.        Behavior: Behavior normal.         Assessment & Plan:   1. Primary hypertension/ Diastolic CHF, acute on chronic [facility]): Chronic, blood pressure slightly elevated today patient states she is in pain.  Obtain CBC and CMP today.  Reviewed last echo from 2021.  Continue losartan 100 mg, amlodipine 5 mg, Lasix 40 mg.  - CBC w/Diff/Platelet - COMPLETE METABOLIC PANEL WITH GFR  2. Mixed hyperlipidemia: Obtain lipid panel  today.  Patient does not believe she has ever been on a statin medication although she is on fenofibrate.  - Lipid Profile  3. Chronic obstructive pulmonary disease with acute exacerbation ([place]): Stable, continue Trelegy and albuterol as needed.  She does have a pulmonologist.  4. Episode of recurrent major depressive disorder, unspecified depression episode severity ([place]): Stable, continue Lexapro 20 mg, Abilify 10 mg and hydroxyzine as needed.  5. Prediabetes: Check A1c today.  - HgB A1c  6. Normocytic anemia: Check CBC, iron panel, B12 and folate today.  - CBC w/Diff/Platelet - Fe+TIBC+Fer - B12 and Folate Panel  7. Vitamin D deficiency: Check vitamin D with above labs.  - Vitamin D (25 hydroxy)  8. Need for hepatitis C screening test: Hepatitis C screening due today.  - Hepatitis C Antibody  9. Need for prophylactic vaccination with Streptococcus pneumoniae (Pneumococcus) and Influenza vaccines: Prevnar 20 administered today.  - Pneumococcal conjugate vaccine 20-valent (Prevnar 20)  10. Hypothyroidism, unspecified type: Check TSH today.  Continue levothyroxine 25 mcg daily.  - TSH   Return in 4 weeks (on 12/13/2022) for 1 onth medical follow up, AMW w/ nurse.   [name], DO

## 2023-01-04 ENCOUNTER — Ambulatory Visit (INDEPENDENT_AMBULATORY_CARE_PROVIDER_SITE_OTHER): Payer: 59 | Admitting: Internal Medicine

## 2023-01-04 ENCOUNTER — Encounter: Payer: Self-pay | Admitting: Internal Medicine

## 2023-01-04 VITALS — BP 160/84 | HR 102 | Resp 16 | Ht 66.0 in | Wt 194.0 lb

## 2023-01-04 DIAGNOSIS — G4700 Insomnia, unspecified: Secondary | ICD-10-CM

## 2023-01-04 DIAGNOSIS — N1832 Chronic kidney disease, stage 3b: Secondary | ICD-10-CM

## 2023-01-04 DIAGNOSIS — E1142 Type 2 diabetes mellitus with diabetic polyneuropathy: Secondary | ICD-10-CM

## 2023-01-04 MED ORDER — TRAZODONE HCL 50 MG PO TABS: 25.0000 mg | ORAL_TABLET | Freq: Every evening | ORAL | 3 refills | Status: DC | PRN

## 2023-01-04 NOTE — Progress Notes (Signed)
   Acute Office Visit  Subjective:     Patient ID: [name], female    DOB: 27-Jul-1956, 67 y.o.   MRN: [id number]  No chief complaint on file.   HPI Patient is in today for insomnia.   INSOMNIA Duration: chronic Satisfied with sleep quality: no Difficulty falling asleep: no Difficulty staying asleep: yes Waking a few hours after sleep onset: yes Early morning awakenings: no Daytime hypersomnolence: no Wakes feeling refreshed: no Apnea: no Snoring: yes Restless legs/nocturnal leg cramps: yes History of sleep study: yes, negative in 2022 Treatments attempted: melatonin    Review of Systems  Constitutional:  Positive for malaise/fatigue. Negative for chills and fever.  Respiratory:  Negative for shortness of breath.   Cardiovascular:  Negative for chest pain.  Neurological:  Negative for headaches.        Objective:    BP (!) 160/84   Pulse (!) 102   Resp 16   Ht '5\' 6"'$  (1.676 m)   Wt 194 lb (88 kg)   SpO2 97%   BMI 31.31 kg/m  BP Readings from Last 3 Encounters:  01/04/23 (!) 160/84  11/29/22 122/78  11/15/22 (!) 140/82   Wt Readings from Last 3 Encounters:  01/04/23 194 lb (88 kg)  11/29/22 177 lb 11.2 oz (80.6 kg)  11/15/22 195 lb 3.2 oz (88.5 kg)      Physical Exam Constitutional:      Appearance: Normal appearance.  HENT:     Head: Normocephalic and atraumatic.  Eyes:     Conjunctiva/sclera: Conjunctivae normal.  Cardiovascular:     Rate and Rhythm: Normal rate and regular rhythm.  Pulmonary:     Effort: Pulmonary effort is normal.     Breath sounds: Normal breath sounds.  Neurological:     General: No focal deficit present.     Mental Status: She is alert. Mental status is at baseline.  Psychiatric:        Mood and Affect: Mood normal.        Behavior: Behavior normal.     No results found for any visits on 01/04/23.      Assessment & Plan:   1. Insomnia, unspecified type: Treat with Trazodone 25-50 mg as needed at night.  Follow up in 3 months.   - traZODone (DESYREL) 50 MG tablet; Take 0.5-1 tablets (25-50 mg total) by mouth at bedtime as needed for sleep.  Dispense: 30 tablet; Refill: 3  2. Stage 3b chronic kidney disease ([place]): Creatinine increased at [place], recheck today.   - Basic Metabolic Panel (BMET)  3. Type 2 diabetes mellitus with peripheral neuropathy [facility]): Urine microalbumin today, last A1c in December 5.9%.  - Urine Microalbumin w/creat. ratio   Return in about 3 months (around 04/05/2023) for CPE w/Pap .  [name], DO

## 2023-01-04 NOTE — Patient Instructions (Addendum)
It was great seeing you today!  Plan discussed at today's visit: -Blood work ordered today, results will be uploaded to MyChart.  -Urine test today -Start Trazodone 25-50 mg at night as needed -Low carb, high protein diet   Follow up in: 3 months for CPE  Take care and let us know if you have any questions or concerns prior to your next visit.  Dr. Rosana Berger

## 2023-01-05 LAB — BASIC METABOLIC PANEL
BUN/Creatinine Ratio: 14 (calc) (ref 6–22)
BUN: 22 mg/dL (ref 7–25)
CO2: 27 mmol/L (ref 20–32)
Calcium: 9.8 mg/dL (ref 8.6–10.4)
Chloride: 99 mmol/L (ref 98–110)
Creat: 1.55 mg/dL — ABNORMAL HIGH (ref 0.50–1.05)
Glucose, Bld: 107 mg/dL — ABNORMAL HIGH (ref 65–99)
Potassium: 4.6 mmol/L (ref 3.5–5.3)
Sodium: 137 mmol/L (ref 135–146)

## 2023-01-05 LAB — MICROALBUMIN / CREATININE URINE RATIO
Creatinine, Urine: 48 mg/dL (ref 20–275)
Microalb Creat Ratio: 23 mcg/mg creat (ref <30)
Microalb, Ur: 1.1 mg/dL

## 2023-01-11 DIAGNOSIS — K1370 Unspecified lesions of oral mucosa: Secondary | ICD-10-CM | POA: Diagnosis not present

## 2023-01-11 DIAGNOSIS — M350C Sjogren syndrome with dental involvement: Secondary | ICD-10-CM | POA: Diagnosis not present

## 2023-01-11 DIAGNOSIS — K130 Diseases of lips: Secondary | ICD-10-CM | POA: Diagnosis not present

## 2023-01-11 DIAGNOSIS — K146 Glossodynia: Secondary | ICD-10-CM | POA: Diagnosis not present

## 2023-01-11 DIAGNOSIS — R682 Dry mouth, unspecified: Secondary | ICD-10-CM | POA: Diagnosis not present

## 2023-01-11 DIAGNOSIS — B370 Candidal stomatitis: Secondary | ICD-10-CM | POA: Diagnosis not present

## 2023-01-13 ENCOUNTER — Telehealth: Payer: Self-pay | Admitting: *Deleted

## 2023-01-13 ENCOUNTER — Telehealth: Payer: Self-pay | Admitting: Internal Medicine

## 2023-01-13 ENCOUNTER — Other Ambulatory Visit: Payer: Self-pay | Admitting: *Deleted

## 2023-01-13 DIAGNOSIS — Z122 Encounter for screening for malignant neoplasm of respiratory organs: Secondary | ICD-10-CM

## 2023-01-13 DIAGNOSIS — Z87891 Personal history of nicotine dependence: Secondary | ICD-10-CM

## 2023-01-13 DIAGNOSIS — F17210 Nicotine dependence, cigarettes, uncomplicated: Secondary | ICD-10-CM

## 2023-01-13 NOTE — Telephone Encounter (Signed)
Pt called stating she needs appt.  She declines using their services she uses elsewhere

## 2023-01-13 NOTE — Telephone Encounter (Signed)
Copied from [place] [id number]. Topic: General - Inquiry >> Jan 13, 2023  1:49 PM Marcellus Scott wrote: Reason for CRM: Madison from [facility] calling to follow up on an order for pull-ups for pt that was faxed on 01/04 and 01/17 . Stated she would re-send today.  Please advise.

## 2023-01-13 NOTE — Telephone Encounter (Signed)
Spoke with patient Scheduled SDMV 03/01/2023 3pm CT scheduled 03/02/2023 11:10am Pt voiced understanding and had no further questions.

## 2023-01-17 DIAGNOSIS — J449 Chronic obstructive pulmonary disease, unspecified: Secondary | ICD-10-CM | POA: Diagnosis not present

## 2023-01-24 DIAGNOSIS — R682 Dry mouth, unspecified: Secondary | ICD-10-CM | POA: Diagnosis not present

## 2023-01-24 DIAGNOSIS — K130 Diseases of lips: Secondary | ICD-10-CM | POA: Diagnosis not present

## 2023-01-24 DIAGNOSIS — M350C Sjogren syndrome with dental involvement: Secondary | ICD-10-CM | POA: Diagnosis not present

## 2023-01-24 DIAGNOSIS — K1370 Unspecified lesions of oral mucosa: Secondary | ICD-10-CM | POA: Diagnosis not present

## 2023-01-25 ENCOUNTER — Telehealth: Payer: Self-pay | Admitting: *Deleted

## 2023-01-25 NOTE — Telephone Encounter (Signed)
Patient Left message on Lung Screening voicemail to call back. Called pt. No answer, left message on VM to return call with number provided.

## 2023-01-31 DIAGNOSIS — R0602 Shortness of breath: Secondary | ICD-10-CM | POA: Diagnosis not present

## 2023-01-31 DIAGNOSIS — J449 Chronic obstructive pulmonary disease, unspecified: Secondary | ICD-10-CM | POA: Diagnosis not present

## 2023-01-31 DIAGNOSIS — F17210 Nicotine dependence, cigarettes, uncomplicated: Secondary | ICD-10-CM | POA: Diagnosis not present

## 2023-02-03 ENCOUNTER — Other Ambulatory Visit: Payer: Self-pay | Admitting: Internal Medicine

## 2023-02-07 NOTE — Telephone Encounter (Signed)
Unable to reach patient.

## 2023-02-10 ENCOUNTER — Ambulatory Visit: Payer: Self-pay

## 2023-02-10 DIAGNOSIS — M4186 Other forms of scoliosis, lumbar region: Secondary | ICD-10-CM | POA: Diagnosis not present

## 2023-02-10 DIAGNOSIS — M791 Myalgia, unspecified site: Secondary | ICD-10-CM | POA: Diagnosis not present

## 2023-02-10 DIAGNOSIS — M5136 Other intervertebral disc degeneration, lumbar region: Secondary | ICD-10-CM | POA: Diagnosis not present

## 2023-02-10 NOTE — Telephone Encounter (Signed)
     Chief Complaint: Low back and leg pain. Seen at pain management today. Did give pain medication. Asking for Tramadol from PCP. Symptoms: Above Frequency: Months Pertinent Negatives: Patient denies  Disposition: []$ ED /[]$ Urgent Care (no appt availability in office) / []$ Appointment(In office/virtual)/ []$  [facility]/ []$ Home Care/ []$ Refused Recommended Disposition /[]$  Mobile Bus/ [x]$  Follow-up with PCP Additional Notes: Please advise pt.  Answer Assessment - Initial Assessment Questions 1. ONSET: "When did the pain begin?"      Months 2. LOCATION: "Where does it hurt?" (upper, mid or lower back)     Low back  3. SEVERITY: "How bad is the pain?"  (e.g., Scale 1-10; mild, moderate, or severe)   - MILD (1-3): Doesn't interfere with normal activities.    - MODERATE (4-7): Interferes with normal activities or awakens from sleep.    - SEVERE (8-10): Excruciating pain, unable to do any normal activities.      Now - 8 4. PATTERN: "Is the pain constant?" (e.g., yes, no; constant, intermittent)      Constant 5. RADIATION: "Does the pain shoot into your legs or somewhere else?"     Left leg 6. CAUSE:  "What do you think is causing the back pain?"      Unsure 7. BACK OVERUSE:  "Any recent lifting of heavy objects, strenuous work or exercise?"     No 8. MEDICINES: "What have you taken so far for the pain?" (e.g., nothing, acetaminophen, NSAIDS)     No 9. NEUROLOGIC SYMPTOMS: "Do you have any weakness, numbness, or problems with bowel/bladder control?"     No 10. OTHER SYMPTOMS: "Do you have any other symptoms?" (e.g., fever, abdomen pain, burning with urination, blood in urine)       No 11. PREGNANCY: "Is there any chance you are pregnant?" "When was your last menstrual period?"       No  Protocols used: Back Pain-A-AH

## 2023-02-10 NOTE — Telephone Encounter (Signed)
Pt calling back to FU to see if provider responded. Advised pt no response from provider yet. Advised her unsure if Dr. Rosana Berger prescribes tramadol but if she was hurting worse she could go to UC but not sure if they would prescribe narcotics as well. Pt preferred not go to UC. Pt asking for referral to pain clinic at [facility] Offered appt for tomorrow with PCP. Scheduled for tomorrow at 1400.

## 2023-02-11 ENCOUNTER — Encounter: Payer: Self-pay | Admitting: Internal Medicine

## 2023-02-11 ENCOUNTER — Ambulatory Visit (INDEPENDENT_AMBULATORY_CARE_PROVIDER_SITE_OTHER): Payer: 59 | Admitting: Internal Medicine

## 2023-02-11 VITALS — BP 142/86 | HR 88 | Temp 98.1°F | Resp 16 | Ht 66.0 in | Wt 204.2 lb

## 2023-02-11 DIAGNOSIS — M4326 Fusion of spine, lumbar region: Secondary | ICD-10-CM

## 2023-02-11 DIAGNOSIS — M5136 Other intervertebral disc degeneration, lumbar region: Secondary | ICD-10-CM | POA: Diagnosis not present

## 2023-02-11 DIAGNOSIS — Z79899 Other long term (current) drug therapy: Secondary | ICD-10-CM

## 2023-02-11 MED ORDER — TIZANIDINE HCL 4 MG PO TABS: 4.0000 mg | ORAL_TABLET | Freq: Four times a day (QID) | ORAL | 0 refills | Status: DC | PRN

## 2023-02-11 MED ORDER — KETOROLAC TROMETHAMINE 60 MG/2ML IM SOLN
30.0000 mg | Freq: Once | INTRAMUSCULAR | Status: AC
  Administered 2023-02-11: 30 mg via INTRAMUSCULAR

## 2023-02-11 NOTE — Patient Instructions (Addendum)
It was great seeing you today!  Plan discussed at today's visit: -Urine drug screen today, if appropriate will prescribe Tramadol 50 mg daily for pain  -Muscle relaxer will be sent today and Toradol injection given for pain  Follow up in: April, already scheduled   Take care and let us know if you have any questions or concerns prior to your next visit.  Dr. Rosana Berger

## 2023-02-11 NOTE — Progress Notes (Signed)
   Acute Office Visit  Subjective:     Patient ID: [name], female    DOB: 24-Sep-1956, 67 y.o.   MRN: [id number]  Chief Complaint  Patient presents with   Back Pain    Radiates down left hip ongoing for 2 weeks, saw pain Dr. yesterday    HPI Patient is in today for chronic back pain. Has a history of degenerative disk disease in the lumbar spine and has a history of lumbar fusion surgery. Also has left sided sciatica. Is following with Orthopedics and was evaluated yesterday at [facility]. Planning on obtaining an MRI but patient in severe pain. Is currently taking Tylenol for pain and cannot take NSAID's due to kidney disease.    Review of Systems  Musculoskeletal:  Positive for back pain.        Objective:    BP (!) 142/86   Pulse 88   Temp 98.1 F (36.7 C)   Resp 16   Ht '5\' 6"'$  (1.676 m)   Wt 204 lb 3.2 oz (92.6 kg)   SpO2 98%   BMI 32.96 kg/m    Physical Exam Constitutional:      Appearance: Normal appearance.  HENT:     Head: Normocephalic and atraumatic.  Eyes:     Conjunctiva/sclera: Conjunctivae normal.  Musculoskeletal:     Lumbar back: Tenderness and bony tenderness present.  Skin:    General: Skin is warm and dry.  Neurological:     General: No focal deficit present.     Mental Status: She is alert. Mental status is at baseline.  Psychiatric:        Mood and Affect: Mood normal.        Behavior: Behavior normal.     No results found for any visits on 02/11/23.      Assessment & Plan:   1. Medication management/Lumbar degenerative disc disease/Fusion of lumbar spine (L3-S1): Toradol injection given for pain today. Muscle relaxers sent to pharmacy to take as needed as well. Urine drug screen and drug contract filled out, plan to prescribe Tramadol 50 mg daily until MRI if drug screen is appropriate. Discussed that I reserve the right to discontinue medication as I see fit and will perform urine drug screens/pill counts. Patient understands  and agrees.   - ketorolac (TORADOL) injection 30 mg - DRUG MONITOR, PANEL 1, W/CONF, URINE - tiZANidine (ZANAFLEX) 4 MG tablet; Take 1 tablet (4 mg total) by mouth every 6 (six) hours as needed for muscle spasms.  Dispense: 30 tablet; Refill: 0   Return for already scheduled .  [name], DO

## 2023-02-13 LAB — DM TEMPLATE

## 2023-02-13 LAB — DRUG MONITOR, PANEL 1, W/CONF, URINE
Alphahydroxyalprazolam: 75 ng/mL — ABNORMAL HIGH (ref <25)
Alphahydroxymidazolam: NEGATIVE ng/mL (ref <50)
Alphahydroxytriazolam: NEGATIVE ng/mL (ref <50)
Aminoclonazepam: NEGATIVE ng/mL (ref <25)
Amphetamines: NEGATIVE ng/mL (ref <500)
Barbiturates: NEGATIVE ng/mL (ref <300)
Benzodiazepines: POSITIVE ng/mL — AB (ref <100)
Cocaine Metabolite: NEGATIVE ng/mL (ref <150)
Creatinine: 26.2 mg/dL (ref >=20.0)
Hydroxyethylflurazepam: NEGATIVE ng/mL (ref <50)
Lorazepam: NEGATIVE ng/mL (ref <50)
Marijuana Metabolite: NEGATIVE ng/mL (ref <20)
Methadone Metabolite: NEGATIVE ng/mL (ref <100)
Nordiazepam: NEGATIVE ng/mL (ref <50)
Opiates: NEGATIVE ng/mL (ref <100)
Oxazepam: NEGATIVE ng/mL (ref <50)
Oxidant: NEGATIVE ug/mL (ref <200)
Oxycodone: NEGATIVE ng/mL (ref <100)
Phencyclidine: NEGATIVE ng/mL (ref <25)
Temazepam: NEGATIVE ng/mL (ref <50)
pH: 7.4 (ref 4.5–9.0)

## 2023-02-14 ENCOUNTER — Telehealth: Payer: Self-pay | Admitting: Internal Medicine

## 2023-02-14 NOTE — Telephone Encounter (Signed)
Lab results were given

## 2023-02-14 NOTE — Telephone Encounter (Signed)
Patient called in for lab results. Please call back

## 2023-02-15 DIAGNOSIS — J449 Chronic obstructive pulmonary disease, unspecified: Secondary | ICD-10-CM | POA: Diagnosis not present

## 2023-02-21 NOTE — Progress Notes (Signed)
Acute Office Visit  Subjective:     Patient ID: [name], female    DOB: [birth year], 67 y.o.   MRN: [id number]  Chief Complaint  Patient presents with   body jerking    At night while asleep would like thyroid checked   Urinary Tract Infection    Frequency and odor    HPI Patient is in today for jerking while sleeping. Patient states her partner notices she will jerk occasionally in her sleep. It can be one limb or multiple, right or left sided or upper or lower extremity. Patient does not wake during these episodes. She denies any neurologic changes during the daytime.  URINARY SYMPTOMS Dysuria: no Urinary frequency: yes Urgency: yes Small volume voids: yes Foul odor: yes Hematuria: no Abdominal pain: no Back pain: no Suprapubic pain/pressure: no Flank pain: no Fever:  no Vaginal discharge: no Treatments attempted: none   Patient wants to talk about chronic back pain and positive UDS from 2 weeks ago. We had a pain medication agreement written up and signed, however UDS was positive for benzodiazepines. In response to this, patient states she was upset about her husband who passed away and her friend had given her have a Xanax, which is why her UDS was positive.  She states this is not usually a medication she takes.  Review of Systems  Constitutional:  Negative for chills and fever.  Respiratory:  Negative for shortness of breath.   Cardiovascular:  Negative for chest pain.  Genitourinary:  Positive for frequency and urgency. Negative for dysuria, flank pain and hematuria.  Musculoskeletal:  Positive for back pain.        Objective:    BP (!) 154/80   Pulse (!) 105   Temp 98 F (36.7 C)   Resp 18   Ht 5\' 6"  (1.676 m)   Wt 201 lb 11.2 oz (91.5 kg)   SpO2 98%   BMI 32.56 kg/m  BP Readings from Last 3 Encounters:  02/22/23 (!) 154/80  02/11/23 (!) 142/86  01/04/23 (!) 160/84   Wt Readings from Last 3 Encounters:  02/22/23 201 lb 11.2 oz (91.5 kg)   02/11/23 204 lb 3.2 oz (92.6 kg)  01/04/23 194 lb (88 kg)      Physical Exam Constitutional:      Appearance: Normal appearance.  HENT:     Head: Normocephalic and atraumatic.  Eyes:     Conjunctiva/sclera: Conjunctivae normal.  Cardiovascular:     Rate and Rhythm: Normal rate and regular rhythm.  Pulmonary:     Effort: Pulmonary effort is normal.     Breath sounds: Normal breath sounds.  Skin:    General: Skin is warm and dry.  Neurological:     General: No focal deficit present.     Mental Status: She is alert. Mental status is at baseline.  Psychiatric:        Mood and Affect: Mood normal.        Behavior: Behavior normal.     No results found for any visits on 02/22/23.      Assessment & Plan:   1. Acute cystitis without hematuria: UA positive for 3+ leukocytes and nitrates.  Will treat with Bactrim twice daily x 3 days.  Urine culture sent.  - POCT urinalysis dipstick - Urine Culture - sulfamethoxazole-trimethoprim (BACTRIM DS) 800-160 MG tablet; Take 1 tablet by mouth 2 (two) times daily for 3 days.  Dispense: 6 tablet; Refill: 0 - fluconazole (DIFLUCAN) 150 MG  tablet; Take 1 tablet (150 mg total) by mouth once for 1 dose.  Dispense: 3 tablet; Refill: 0  2. Medication management/Lumbar degenerative disc disease: Patient states her urine was positive for benzos last time because she took a half of one of her friends Xanax.  We will repeat her UDS today, if it is abnormal no medication will be prescribed.  - DRUG MONITOR, PANEL 1, W/CONF, URINE  3. Restless movement disorder/Hypothyroidism, unspecified type/Type 2 diabetes mellitus with peripheral neuropathy (HCC): Recheck thyroid function, A1c.  Rule out vitamin deficiencies.  - Fe+TIBC+Fer - Vitamin D (25 hydroxy) - Vitamin B1 - HgB A1c - COMPLETE METABOLIC PANEL WITH GFR   Return if symptoms worsen or fail to improve.  Margarita Mail, DO

## 2023-02-22 ENCOUNTER — Encounter: Payer: Self-pay | Admitting: Internal Medicine

## 2023-02-22 ENCOUNTER — Ambulatory Visit (INDEPENDENT_AMBULATORY_CARE_PROVIDER_SITE_OTHER): Payer: 59 | Admitting: Internal Medicine

## 2023-02-22 VITALS — BP 154/80 | HR 105 | Temp 98.0°F | Resp 18 | Ht 66.0 in | Wt 201.7 lb

## 2023-02-22 DIAGNOSIS — G2571 Drug induced akathisia: Secondary | ICD-10-CM | POA: Diagnosis not present

## 2023-02-22 DIAGNOSIS — N3000 Acute cystitis without hematuria: Secondary | ICD-10-CM

## 2023-02-22 DIAGNOSIS — Z79899 Other long term (current) drug therapy: Secondary | ICD-10-CM | POA: Diagnosis not present

## 2023-02-22 DIAGNOSIS — E559 Vitamin D deficiency, unspecified: Secondary | ICD-10-CM | POA: Diagnosis not present

## 2023-02-22 DIAGNOSIS — E1142 Type 2 diabetes mellitus with diabetic polyneuropathy: Secondary | ICD-10-CM | POA: Diagnosis not present

## 2023-02-22 DIAGNOSIS — M51369 Other intervertebral disc degeneration, lumbar region without mention of lumbar back pain or lower extremity pain: Secondary | ICD-10-CM

## 2023-02-22 DIAGNOSIS — E039 Hypothyroidism, unspecified: Secondary | ICD-10-CM | POA: Diagnosis not present

## 2023-02-22 DIAGNOSIS — R8290 Unspecified abnormal findings in urine: Secondary | ICD-10-CM | POA: Diagnosis not present

## 2023-02-22 DIAGNOSIS — M5136 Other intervertebral disc degeneration, lumbar region: Secondary | ICD-10-CM | POA: Diagnosis not present

## 2023-02-22 DIAGNOSIS — R350 Frequency of micturition: Secondary | ICD-10-CM | POA: Diagnosis not present

## 2023-02-22 LAB — POCT URINALYSIS DIPSTICK
Bilirubin, UA: NEGATIVE
Blood, UA: NEGATIVE
Glucose, UA: NEGATIVE
Ketones, UA: NEGATIVE
Nitrite, UA: POSITIVE
Protein, UA: NEGATIVE
Spec Grav, UA: 1.02 (ref 1.010–1.025)
Urobilinogen, UA: 0.2 E.U./dL
pH, UA: 6 (ref 5.0–8.0)

## 2023-02-22 MED ORDER — FLUCONAZOLE 150 MG PO TABS: 150.0000 mg | ORAL_TABLET | Freq: Once | ORAL | 0 refills | Status: AC

## 2023-02-22 MED ORDER — SULFAMETHOXAZOLE-TRIMETHOPRIM 800-160 MG PO TABS: 1.0000 | ORAL_TABLET | Freq: Two times a day (BID) | ORAL | 0 refills | Status: AC

## 2023-02-22 NOTE — Patient Instructions (Addendum)
It was great seeing you today!  Plan discussed at today's visit: -Blood work ordered today, results will be uploaded to [facility].  -Repeat UDS today -Urine showing infection, antibiotic sent to pharmacy to take twice a day for 3 days -Yeast infection medication sent as well   Follow up in: already scheduled for April   Take care and let us know if you have any questions or concerns prior to your next visit.  Dr. Rosana Berger

## 2023-02-23 ENCOUNTER — Other Ambulatory Visit: Payer: Self-pay | Admitting: Internal Medicine

## 2023-02-23 DIAGNOSIS — M4326 Fusion of spine, lumbar region: Secondary | ICD-10-CM

## 2023-02-23 DIAGNOSIS — M5136 Other intervertebral disc degeneration, lumbar region: Secondary | ICD-10-CM

## 2023-02-24 LAB — COMPLETE METABOLIC PANEL WITH GFR
AG Ratio: 1.4 (calc) (ref 1.0–2.5)
ALT: 18 U/L (ref 6–29)
AST: 29 U/L (ref 10–35)
Albumin: 4 g/dL (ref 3.6–5.1)
Alkaline phosphatase (APISO): 24 U/L — ABNORMAL LOW (ref 37–153)
BUN/Creatinine Ratio: 12 (calc) (ref 6–22)
BUN: 15 mg/dL (ref 7–25)
CO2: 23 mmol/L (ref 20–32)
Calcium: 10 mg/dL (ref 8.6–10.4)
Chloride: 95 mmol/L — ABNORMAL LOW (ref 98–110)
Creat: 1.25 mg/dL — ABNORMAL HIGH (ref 0.50–1.05)
Globulin: 2.9 g/dL (calc) (ref 1.9–3.7)
Glucose, Bld: 111 mg/dL — ABNORMAL HIGH (ref 65–99)
Potassium: 4 mmol/L (ref 3.5–5.3)
Sodium: 131 mmol/L — ABNORMAL LOW (ref 135–146)
Total Bilirubin: 0.5 mg/dL (ref 0.2–1.2)
Total Protein: 6.9 g/dL (ref 6.1–8.1)
eGFR: 48 mL/min/{1.73_m2} — ABNORMAL LOW (ref >=60)

## 2023-02-24 LAB — IRON,TIBC AND FERRITIN PANEL
%SAT: 16 % (calc) (ref 16–45)
Ferritin: 84 ng/mL (ref 16–288)
Iron: 60 ug/dL (ref 45–160)
TIBC: 367 mcg/dL (calc) (ref 250–450)

## 2023-02-24 LAB — DRUG MONITOR, PANEL 1, W/CONF, URINE
Amphetamines: NEGATIVE ng/mL (ref <500)
Barbiturates: NEGATIVE ng/mL (ref <300)
Benzodiazepines: NEGATIVE ng/mL (ref <100)
Cocaine Metabolite: NEGATIVE ng/mL (ref <150)
Creatinine: 35.3 mg/dL (ref >=20.0)
Marijuana Metabolite: NEGATIVE ng/mL (ref <20)
Methadone Metabolite: NEGATIVE ng/mL (ref <100)
Opiates: NEGATIVE ng/mL (ref <100)
Oxidant: NEGATIVE ug/mL (ref <200)
Oxycodone: NEGATIVE ng/mL (ref <100)
Phencyclidine: NEGATIVE ng/mL (ref <25)
pH: 7.3 (ref 4.5–9.0)

## 2023-02-24 LAB — URINE CULTURE
MICRO NUMBER:: 14682355
SPECIMEN QUALITY:: ADEQUATE

## 2023-02-24 LAB — HEMOGLOBIN A1C
Hgb A1c MFr Bld: 5.6 % of total Hgb (ref <5.7)
Mean Plasma Glucose: 114 mg/dL
eAG (mmol/L): 6.3 mmol/L

## 2023-02-24 LAB — DM TEMPLATE

## 2023-02-24 LAB — VITAMIN D 25 HYDROXY (VIT D DEFICIENCY, FRACTURES): Vit D, 25-Hydroxy: 58 ng/mL (ref 30–100)

## 2023-02-24 LAB — VITAMIN B1: Vitamin B1 (Thiamine): 16 nmol/L (ref 8–30)

## 2023-02-24 LAB — TSH: TSH: 2.01 mIU/L (ref 0.40–4.50)

## 2023-02-25 DIAGNOSIS — M533 Sacrococcygeal disorders, not elsewhere classified: Secondary | ICD-10-CM | POA: Diagnosis not present

## 2023-02-25 MED ORDER — TRAMADOL HCL 50 MG PO TABS: 50.0000 mg | ORAL_TABLET | Freq: Every day | ORAL | 0 refills | Status: DC | PRN

## 2023-02-25 NOTE — Addendum Note (Signed)
Addended by: [name] on: 02/25/2023 08:14 AM   Modules accepted: Orders

## 2023-02-28 ENCOUNTER — Other Ambulatory Visit (INDEPENDENT_AMBULATORY_CARE_PROVIDER_SITE_OTHER): Payer: Self-pay | Admitting: Nurse Practitioner

## 2023-02-28 DIAGNOSIS — I83892 Varicose veins of left lower extremities with other complications: Secondary | ICD-10-CM

## 2023-03-01 ENCOUNTER — Ambulatory Visit (INDEPENDENT_AMBULATORY_CARE_PROVIDER_SITE_OTHER): Payer: 59

## 2023-03-01 ENCOUNTER — Encounter: Payer: Self-pay | Admitting: Acute Care

## 2023-03-01 ENCOUNTER — Ambulatory Visit: Payer: Medicare Other | Admitting: Acute Care

## 2023-03-01 ENCOUNTER — Ambulatory Visit (INDEPENDENT_AMBULATORY_CARE_PROVIDER_SITE_OTHER): Payer: 59 | Admitting: Acute Care

## 2023-03-01 ENCOUNTER — Encounter (INDEPENDENT_AMBULATORY_CARE_PROVIDER_SITE_OTHER): Payer: Self-pay

## 2023-03-01 ENCOUNTER — Ambulatory Visit (INDEPENDENT_AMBULATORY_CARE_PROVIDER_SITE_OTHER): Payer: 59 | Admitting: Vascular Surgery

## 2023-03-01 DIAGNOSIS — F17210 Nicotine dependence, cigarettes, uncomplicated: Secondary | ICD-10-CM

## 2023-03-01 NOTE — Patient Instructions (Signed)
Thank you for participating in the [facility]. It was our pleasure to meet you today. We will call you with the results of your scan within the next few days. Your scan will be assigned a Lung RADS category score by the physicians reading the scans.  This Lung RADS score determines follow up scanning.  See below for description of categories, and follow up screening recommendations. We will be in touch to schedule your follow up screening annually or based on recommendations of our providers. We will fax a copy of your scan results to your Primary Care Physician, or the physician who referred you to the program, to ensure they have the results. Please call the office if you have any questions or concerns regarding your scanning experience or results.  Our office number is [phone]. Please speak with Denise Phelps, RN. , or  Denise Buckner RN, They are  our Lung Cancer Screening RN.'s If They are unavailable when you call, Please leave a message on the voice mail. We will return your call at our earliest convenience.This voice mail is monitored several times a day.  Remember, if your scan is normal, we will scan you annually as long as you continue to meet the criteria for the program. (Age 50-80, Current smoker or smoker who has quit within the last 15 years). If you are a smoker, remember, quitting is the single most powerful action that you can take to decrease your risk of lung cancer and other pulmonary, breathing related problems. We know quitting is hard, and we are here to help.  Please let us know if there is anything we can do to help you meet your goal of quitting. If you are a former smoker, congratulations. We are proud of you! Remain smoke free! Remember you can refer friends or family members through the number above.  We will screen them to make sure they meet criteria for the program. Thank you for helping us take better care of you by  participating in Lung Screening.  You can receive free nicotine replacement therapy ( patches, gum or mints) by calling 1-800-QUIT NOW. Please call so we can get you on the path to becoming  a non-smoker. I know it is hard, but you can do this!  Lung RADS Categories:  Lung RADS 1: no nodules or definitely non-concerning nodules.  Recommendation is for a repeat annual scan in 12 months.  Lung RADS 2:  nodules that are non-concerning in appearance and behavior with a very low likelihood of becoming an active cancer. Recommendation is for a repeat annual scan in 12 months.  Lung RADS 3: nodules that are probably non-concerning , includes nodules with a low likelihood of becoming an active cancer.  Recommendation is for a 6-month repeat screening scan. Often noted after an upper respiratory illness. We will be in touch to make sure you have no questions, and to schedule your 6-month scan.  Lung RADS 4 A: nodules with concerning findings, recommendation is most often for a follow up scan in 3 months or additional testing based on our provider's assessment of the scan. We will be in touch to make sure you have no questions and to schedule the recommended 3 month follow up scan.  Lung RADS 4 B:  indicates findings that are concerning. We will be in touch with you to schedule additional diagnostic testing based on our provider's  assessment of the scan.  Other options for assistance in smoking cessation (   As covered by your insurance benefits)  Hypnosis for smoking cessation  [facility] [phone]  Acupuncture for smoking cessation  [facility] [phone]   

## 2023-03-01 NOTE — Progress Notes (Signed)
Virtual Visit via Telephone Note  I connected with [name] on 03/01/23 at  3:00 PM EDT by telephone and verified that I am speaking with the correct person using two identifiers.  Location: Patient: At home Provider:  [place], [place], [place], [address]    I discussed the limitations, risks, security and privacy concerns of performing an evaluation and management service by telephone and the availability of in person appointments. I also discussed with the patient that there may be a patient responsible charge related to this service. The patient expressed understanding and agreed to proceed.   Shared Decision Making Visit Lung Cancer Screening Program [id number])   Eligibility: Age 67 y.o. Pack Years Smoking History Calculation 100 pack year smoking history (# packs/per year x # years smoked) Recent History of coughing up blood  no Unexplained weight loss? no ( >Than 15 pounds within the last 6 months ) Prior History Lung / other cancer no (Diagnosis within the last 5 years already requiring surveillance chest CT Scans). Smoking Status Current Smoker Former Smokers: Years since quit:  NA  Quit Date:  NA  Visit Components: Discussion included one or more decision making aids. yes Discussion included risk/benefits of screening. yes Discussion included potential follow up diagnostic testing for abnormal scans. yes Discussion included meaning and risk of over diagnosis. yes Discussion included meaning and risk of False Positives. yes Discussion included meaning of total radiation exposure. yes  Counseling Included: Importance of adherence to annual lung cancer LDCT screening. yes Impact of comorbidities on ability to participate in the program. yes Ability and willingness to under diagnostic treatment. yes  Smoking Cessation Counseling: Current Smokers:  Discussed importance of smoking cessation. yes Information about tobacco cessation classes and  interventions provided to patient. yes Patient provided with "ticket" for LDCT Scan. yes Symptomatic Patient. no  Counseling NA Diagnosis Code: Tobacco Use Z72.0 Asymptomatic Patient yes  Counseling (Intermediate counseling: > three minutes counseling) [id number] Former Smokers:  Discussed the importance of maintaining cigarette abstinence. yes Diagnosis Code: Personal History of Nicotine Dependence. [id number] Information about tobacco cessation classes and interventions provided to patient. Yes Patient provided with "ticket" for LDCT Scan. yes Written Order for Lung Cancer Screening with LDCT placed in Epic. Yes (CT Chest Lung Cancer Screening Low Dose W/O CM) [id number] Z12.2-Screening of respiratory organs Z87.891-Personal history of nicotine dependence  I have spent 25 minutes of face to face/ virtual visit   time with Ms. Sortor discussing the risks and benefits of lung cancer screening. We viewed / discussed a power point together that explained in detail the above noted topics. We paused at intervals to allow for questions to be asked and answered to ensure understanding.We discussed that the single most powerful action that she can take to decrease her risk of developing lung cancer is to quit smoking. We discussed whether or not she is ready to commit to setting a quit date. We discussed options for tools to aid in quitting smoking including nicotine replacement therapy, non-nicotine medications, support groups, Quit Smart classes, and behavior modification. We discussed that often times setting smaller, more achievable goals, such as eliminating 1 cigarette a day for a week and then 2 cigarettes a day for a week can be helpful in slowly decreasing the number of cigarettes smoked. This allows for a sense of accomplishment as well as providing a clinical benefit. I provided  her  with smoking cessation  information  with contact information for community resources, classes, free  nicotine replacement  therapy, and access to mobile apps, text messaging, and on-line smoking cessation help. I have also provided  her  the office contact information in the event she needs to contact me, or the screening staff. We discussed the time and location of the scan, and that either Doroteo Glassman RN, Joella Prince, RN  or I will call / send a letter with the results within 24-72 hours of receiving them. The patient verbalized understanding of all of  the above and had no further questions upon leaving the office. They have my contact information in the event they have any further questions.  I spent 3 minutes counseling on smoking cessation and the health risks of continued tobacco abuse.  I explained to the patient that there has been a high incidence of coronary artery disease noted on these exams. I explained that this is a non-gated exam therefore degree or severity cannot be determined. This patient is not on statin therapy. I have asked the patient to follow-up with their PCP regarding any incidental finding of coronary artery disease and management with diet or medication as their PCP  feels is clinically indicated. The patient verbalized understanding of the above and had no further questions upon completion of the visit.      Magdalen Spatz, NP 03/01/2023

## 2023-03-02 ENCOUNTER — Telehealth: Payer: Self-pay | Admitting: Internal Medicine

## 2023-03-02 ENCOUNTER — Ambulatory Visit
Admission: RE | Admit: 2023-03-02 | Discharge: 2023-03-02 | Disposition: A | Discharge status: Home / Self Care | Payer: 59 | Source: Ambulatory Visit | Attending: Acute Care | Admitting: Acute Care

## 2023-03-02 DIAGNOSIS — Z87891 Personal history of nicotine dependence: Secondary | ICD-10-CM | POA: Diagnosis not present

## 2023-03-02 DIAGNOSIS — Z122 Encounter for screening for malignant neoplasm of respiratory organs: Secondary | ICD-10-CM

## 2023-03-02 DIAGNOSIS — F17210 Nicotine dependence, cigarettes, uncomplicated: Secondary | ICD-10-CM | POA: Diagnosis not present

## 2023-03-02 NOTE — Telephone Encounter (Signed)
Pt called in because she says the medicine Dr. Rosana Berger prescribed for pain isn't working. Please advise.

## 2023-03-02 NOTE — Telephone Encounter (Unsigned)
Copied from [facility] [id number]. Topic: Referral - Question >> Mar 02, 2023  1:29 PM Santiya F wrote: Reason for CRM: Pt is calling in because she wants to know if Dr. Rosana Berger could refer her to a female psychiatrist. Please advise.

## 2023-03-04 ENCOUNTER — Other Ambulatory Visit: Payer: Self-pay | Admitting: Internal Medicine

## 2023-03-04 ENCOUNTER — Other Ambulatory Visit: Payer: Self-pay | Admitting: Acute Care

## 2023-03-04 DIAGNOSIS — Z87891 Personal history of nicotine dependence: Secondary | ICD-10-CM

## 2023-03-04 DIAGNOSIS — F339 Major depressive disorder, recurrent, unspecified: Secondary | ICD-10-CM

## 2023-03-04 DIAGNOSIS — F17210 Nicotine dependence, cigarettes, uncomplicated: Secondary | ICD-10-CM

## 2023-03-04 DIAGNOSIS — Z122 Encounter for screening for malignant neoplasm of respiratory organs: Secondary | ICD-10-CM

## 2023-03-04 NOTE — Telephone Encounter (Signed)
The patient has made an additional phone call to follow up with a member of staff regarding their medication management and concerns  Please contact the patient further when possible

## 2023-03-04 NOTE — Telephone Encounter (Signed)
Patient informed Dr. Rosana Berger out of office but message has been sent.  She was told Dr. Does not prescribe anything stronger of pain/controlled meds.  She will need to f/u with her pain provider.

## 2023-03-08 DIAGNOSIS — I5032 Chronic diastolic (congestive) heart failure: Secondary | ICD-10-CM | POA: Diagnosis not present

## 2023-03-08 DIAGNOSIS — F17209 Nicotine dependence, unspecified, with unspecified nicotine-induced disorders: Secondary | ICD-10-CM | POA: Diagnosis not present

## 2023-03-08 DIAGNOSIS — J439 Emphysema, unspecified: Secondary | ICD-10-CM | POA: Diagnosis not present

## 2023-03-08 DIAGNOSIS — N1832 Chronic kidney disease, stage 3b: Secondary | ICD-10-CM | POA: Diagnosis not present

## 2023-03-08 DIAGNOSIS — I10 Essential (primary) hypertension: Secondary | ICD-10-CM | POA: Diagnosis not present

## 2023-03-08 DIAGNOSIS — G894 Chronic pain syndrome: Secondary | ICD-10-CM | POA: Diagnosis not present

## 2023-03-08 DIAGNOSIS — R0602 Shortness of breath: Secondary | ICD-10-CM | POA: Diagnosis not present

## 2023-03-08 DIAGNOSIS — K219 Gastro-esophageal reflux disease without esophagitis: Secondary | ICD-10-CM | POA: Diagnosis not present

## 2023-03-08 DIAGNOSIS — J9621 Acute and chronic respiratory failure with hypoxia: Secondary | ICD-10-CM | POA: Diagnosis not present

## 2023-03-08 DIAGNOSIS — R000 Tachycardia, unspecified: Secondary | ICD-10-CM | POA: Diagnosis not present

## 2023-03-09 ENCOUNTER — Other Ambulatory Visit: Payer: Self-pay | Admitting: Internal Medicine

## 2023-03-09 DIAGNOSIS — R000 Tachycardia, unspecified: Secondary | ICD-10-CM

## 2023-03-11 ENCOUNTER — Other Ambulatory Visit: Payer: 59

## 2023-03-11 ENCOUNTER — Ambulatory Visit
Admission: RE | Admit: 2023-03-11 | Discharge: 2023-03-11 | Disposition: A | Discharge status: Home / Self Care | Payer: 59 | Source: Ambulatory Visit | Attending: Internal Medicine | Admitting: Internal Medicine

## 2023-03-11 DIAGNOSIS — R000 Tachycardia, unspecified: Secondary | ICD-10-CM | POA: Insufficient documentation

## 2023-03-12 ENCOUNTER — Encounter (HOSPITAL_COMMUNITY): Payer: Self-pay

## 2023-03-12 ENCOUNTER — Emergency Department (HOSPITAL_COMMUNITY): Payer: 59

## 2023-03-12 ENCOUNTER — Emergency Department (HOSPITAL_COMMUNITY)
Admission: EM | Admit: 2023-03-12 | Discharge: 2023-03-13 | Disposition: A | Discharge status: Home / Self Care | Payer: 59 | Attending: Emergency Medicine | Admitting: Emergency Medicine

## 2023-03-12 DIAGNOSIS — I959 Hypotension, unspecified: Secondary | ICD-10-CM | POA: Diagnosis not present

## 2023-03-12 DIAGNOSIS — S0990XA Unspecified injury of head, initial encounter: Secondary | ICD-10-CM | POA: Insufficient documentation

## 2023-03-12 DIAGNOSIS — S199XXA Unspecified injury of neck, initial encounter: Secondary | ICD-10-CM | POA: Diagnosis not present

## 2023-03-12 DIAGNOSIS — J449 Chronic obstructive pulmonary disease, unspecified: Secondary | ICD-10-CM | POA: Insufficient documentation

## 2023-03-12 DIAGNOSIS — S91012A Laceration without foreign body, left ankle, initial encounter: Secondary | ICD-10-CM | POA: Insufficient documentation

## 2023-03-12 DIAGNOSIS — Z96653 Presence of artificial knee joint, bilateral: Secondary | ICD-10-CM | POA: Insufficient documentation

## 2023-03-12 DIAGNOSIS — I10 Essential (primary) hypertension: Secondary | ICD-10-CM | POA: Diagnosis not present

## 2023-03-12 DIAGNOSIS — S299XXA Unspecified injury of thorax, initial encounter: Secondary | ICD-10-CM | POA: Insufficient documentation

## 2023-03-12 DIAGNOSIS — I509 Heart failure, unspecified: Secondary | ICD-10-CM | POA: Insufficient documentation

## 2023-03-12 DIAGNOSIS — S99912A Unspecified injury of left ankle, initial encounter: Secondary | ICD-10-CM | POA: Diagnosis present

## 2023-03-12 DIAGNOSIS — Y92410 Unspecified street and highway as the place of occurrence of the external cause: Secondary | ICD-10-CM | POA: Insufficient documentation

## 2023-03-12 DIAGNOSIS — I700 Atherosclerosis of aorta: Secondary | ICD-10-CM | POA: Diagnosis not present

## 2023-03-12 DIAGNOSIS — I110 Hypertensive heart disease with heart failure: Secondary | ICD-10-CM | POA: Diagnosis not present

## 2023-03-12 DIAGNOSIS — S3991XA Unspecified injury of abdomen, initial encounter: Secondary | ICD-10-CM | POA: Insufficient documentation

## 2023-03-12 DIAGNOSIS — Z23 Encounter for immunization: Secondary | ICD-10-CM | POA: Insufficient documentation

## 2023-03-12 DIAGNOSIS — Z041 Encounter for examination and observation following transport accident: Secondary | ICD-10-CM | POA: Diagnosis not present

## 2023-03-12 DIAGNOSIS — E039 Hypothyroidism, unspecified: Secondary | ICD-10-CM | POA: Diagnosis not present

## 2023-03-12 DIAGNOSIS — S81812A Laceration without foreign body, left lower leg, initial encounter: Secondary | ICD-10-CM | POA: Diagnosis not present

## 2023-03-12 DIAGNOSIS — F17210 Nicotine dependence, cigarettes, uncomplicated: Secondary | ICD-10-CM | POA: Insufficient documentation

## 2023-03-12 DIAGNOSIS — J9811 Atelectasis: Secondary | ICD-10-CM | POA: Diagnosis not present

## 2023-03-12 DIAGNOSIS — R0902 Hypoxemia: Secondary | ICD-10-CM | POA: Diagnosis not present

## 2023-03-12 DIAGNOSIS — N1330 Unspecified hydronephrosis: Secondary | ICD-10-CM | POA: Diagnosis not present

## 2023-03-12 DIAGNOSIS — K3189 Other diseases of stomach and duodenum: Secondary | ICD-10-CM | POA: Diagnosis not present

## 2023-03-12 LAB — I-STAT CHEM 8, ED
BUN: 30 mg/dL — ABNORMAL HIGH (ref 8–23)
Calcium, Ion: 1.19 mmol/L (ref 1.15–1.40)
Chloride: 91 mmol/L — ABNORMAL LOW (ref 98–111)
Creatinine, Ser: 2.1 mg/dL — ABNORMAL HIGH (ref 0.44–1.00)
Glucose, Bld: 108 mg/dL — ABNORMAL HIGH (ref 70–99)
HCT: 47 % — ABNORMAL HIGH (ref 36.0–46.0)
Hemoglobin: 16 g/dL — ABNORMAL HIGH (ref 12.0–15.0)
Potassium: 4.5 mmol/L (ref 3.5–5.1)
Sodium: 130 mmol/L — ABNORMAL LOW (ref 135–145)
TCO2: 32 mmol/L (ref 22–32)

## 2023-03-12 MED ORDER — FENTANYL CITRATE PF 50 MCG/ML IJ SOSY
50.0000 ug | PREFILLED_SYRINGE | Freq: Once | INTRAMUSCULAR | Status: AC
  Administered 2023-03-12: 50 ug via INTRAVENOUS
  Filled 2023-03-12: qty 1

## 2023-03-12 MED ORDER — SODIUM CHLORIDE 0.9 % IV BOLUS
500.0000 mL | Freq: Once | INTRAVENOUS | Status: AC
  Administered 2023-03-13: 500 mL via INTRAVENOUS
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
## 2023-03-13 ENCOUNTER — Emergency Department (HOSPITAL_COMMUNITY): Payer: 59

## 2023-03-13 ENCOUNTER — Other Ambulatory Visit: Payer: Self-pay

## 2023-03-13 DIAGNOSIS — Z041 Encounter for examination and observation following transport accident: Secondary | ICD-10-CM | POA: Diagnosis not present

## 2023-03-13 DIAGNOSIS — N1330 Unspecified hydronephrosis: Secondary | ICD-10-CM | POA: Diagnosis not present

## 2023-03-13 DIAGNOSIS — I700 Atherosclerosis of aorta: Secondary | ICD-10-CM | POA: Diagnosis not present

## 2023-03-13 DIAGNOSIS — S199XXA Unspecified injury of neck, initial encounter: Secondary | ICD-10-CM | POA: Diagnosis not present

## 2023-03-13 DIAGNOSIS — K3189 Other diseases of stomach and duodenum: Secondary | ICD-10-CM | POA: Diagnosis not present

## 2023-03-13 DIAGNOSIS — S0990XA Unspecified injury of head, initial encounter: Secondary | ICD-10-CM | POA: Diagnosis not present

## 2023-03-13 DIAGNOSIS — S91012A Laceration without foreign body, left ankle, initial encounter: Secondary | ICD-10-CM | POA: Diagnosis not present

## 2023-03-13 DIAGNOSIS — J9811 Atelectasis: Secondary | ICD-10-CM | POA: Diagnosis not present

## 2023-03-13 LAB — CBC
HCT: 45 % (ref 36.0–46.0)
Hemoglobin: 14.8 g/dL (ref 12.0–15.0)
MCH: 32.8 pg (ref 26.0–34.0)
MCHC: 32.9 g/dL (ref 30.0–36.0)
MCV: 99.8 fL (ref 80.0–100.0)
Platelets: 279 10*3/uL (ref 150–400)
RBC: 4.51 MIL/uL (ref 3.87–5.11)
RDW: 13.2 % (ref 11.5–15.5)
WBC: 12.6 10*3/uL — ABNORMAL HIGH (ref 4.0–10.5)
nRBC: 0 % (ref 0.0–0.2)

## 2023-03-13 LAB — COMPREHENSIVE METABOLIC PANEL
ALT: 27 U/L (ref 0–44)
AST: 33 U/L (ref 15–41)
Albumin: 3.8 g/dL (ref 3.5–5.0)
Alkaline Phosphatase: 20 U/L — ABNORMAL LOW (ref 38–126)
Anion gap: 12 (ref 5–15)
BUN: 25 mg/dL — ABNORMAL HIGH (ref 8–23)
CO2: 29 mmol/L (ref 22–32)
Calcium: 9.6 mg/dL (ref 8.9–10.3)
Chloride: 90 mmol/L — ABNORMAL LOW (ref 98–111)
Creatinine, Ser: 1.88 mg/dL — ABNORMAL HIGH (ref 0.44–1.00)
GFR, Estimated: 29 mL/min — ABNORMAL LOW (ref >60)
Glucose, Bld: 114 mg/dL — ABNORMAL HIGH (ref 70–99)
Potassium: 4.7 mmol/L (ref 3.5–5.1)
Sodium: 131 mmol/L — ABNORMAL LOW (ref 135–145)
Total Bilirubin: 0.9 mg/dL (ref 0.3–1.2)
Total Protein: 6.9 g/dL (ref 6.5–8.1)

## 2023-03-13 LAB — PROTIME-INR
INR: 1 (ref 0.8–1.2)
Prothrombin Time: 13.5 seconds (ref 11.4–15.2)

## 2023-03-13 LAB — LACTIC ACID, PLASMA: Lactic Acid, Venous: 0.7 mmol/L (ref 0.5–1.9)

## 2023-03-13 MED ORDER — TETANUS-DIPHTH-ACELL PERTUSSIS 5-2.5-18.5 LF-MCG/0.5 IM SUSY
0.5000 mL | PREFILLED_SYRINGE | Freq: Once | INTRAMUSCULAR | Status: AC
  Administered 2023-03-13: 0.5 mL via INTRAMUSCULAR
  Filled 2023-03-13: qty 0.5

## 2023-03-13 MED ORDER — CEPHALEXIN 500 MG PO CAPS: 500.0000 mg | ORAL_CAPSULE | Freq: Three times a day (TID) | ORAL | 0 refills | Status: AC

## 2023-03-13 MED ORDER — MORPHINE SULFATE (PF) 2 MG/ML IV SOLN
2.0000 mg | Freq: Once | INTRAVENOUS | Status: AC
  Administered 2023-03-13: 2 mg via INTRAVENOUS
  Filled 2023-03-13: qty 1

## 2023-03-13 MED ORDER — LIDOCAINE HCL (PF) 1 % IJ SOLN
30.0000 mL | Freq: Once | INTRAMUSCULAR | Status: AC
  Administered 2023-03-13: 30 mL
  Filled 2023-03-13: qty 30

## 2023-03-13 NOTE — Discharge Instructions (Addendum)
You were evaluated in the Emergency Department and after careful evaluation, we did not find any emergent condition requiring admission or further testing in the hospital.  Your exam/testing today is overall reassuring.  Recommend Tylenol at home 1000 mg every 4-6 hours as needed for pain.  Take the Keflex antibiotic to prevent infection.  Please return to the Emergency Department if you experience any worsening of your condition.   Thank you for allowing [place] to be a part of your care.

## 2023-03-14 ENCOUNTER — Telehealth (INDEPENDENT_AMBULATORY_CARE_PROVIDER_SITE_OTHER): Payer: Self-pay | Admitting: Nurse Practitioner

## 2023-03-14 NOTE — Telephone Encounter (Signed)
Pt LVMOM requesting a return call from the office. I couldn't quite make out the message but looking through her chart, it looks as if she needs to R/S an appt from 3.19.24. I LVM for pt advising to return our call.

## 2023-03-15 ENCOUNTER — Telehealth: Payer: Self-pay | Admitting: Internal Medicine

## 2023-03-15 ENCOUNTER — Encounter: Payer: Self-pay | Admitting: Internal Medicine

## 2023-03-15 ENCOUNTER — Other Ambulatory Visit: Payer: Self-pay | Admitting: Internal Medicine

## 2023-03-15 DIAGNOSIS — R931 Abnormal findings on diagnostic imaging of heart and coronary circulation: Secondary | ICD-10-CM

## 2023-03-15 DIAGNOSIS — I5032 Chronic diastolic (congestive) heart failure: Secondary | ICD-10-CM
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
## 2023-03-17 ENCOUNTER — Telehealth: Payer: Self-pay | Admitting: *Deleted

## 2023-03-17 NOTE — Telephone Encounter (Signed)
     Patient  visit on 03/13/2023  at [facility]  was for treatment   Have you been able to follow up with your primary care physician? Very sore but will follow up with pcp as she is still able to get transportation and her medication as she needs  The patient was able to obtain any needed medicine or equipment.  Are there diet recommendations that you are having difficulty following?  Patient expresses understanding of discharge instructions and education provided has no other needs at this time.    [place] [phone] [address] , [place] [address] Email : [email or web address]

## 2023-03-18 DIAGNOSIS — S6991XA Unspecified injury of right wrist, hand and finger(s), initial encounter: Secondary | ICD-10-CM | POA: Diagnosis not present

## 2023-03-18 DIAGNOSIS — J449 Chronic obstructive pulmonary disease, unspecified: Secondary | ICD-10-CM | POA: Diagnosis not present
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
## 2023-03-25 ENCOUNTER — Ambulatory Visit (INDEPENDENT_AMBULATORY_CARE_PROVIDER_SITE_OTHER): Payer: 59 | Admitting: Internal Medicine

## 2023-03-25 ENCOUNTER — Encounter: Payer: Self-pay | Admitting: Internal Medicine

## 2023-03-25 DIAGNOSIS — S81812D Laceration without foreign body, left lower leg, subsequent encounter: Secondary | ICD-10-CM | POA: Diagnosis not present

## 2023-03-25 DIAGNOSIS — Z4802 Encounter for removal of sutures: Secondary | ICD-10-CM

## 2023-03-28 ENCOUNTER — Telehealth: Payer: Self-pay | Admitting: Internal Medicine
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
## 2023-03-31 ENCOUNTER — Other Ambulatory Visit: Payer: Self-pay | Admitting: Internal Medicine

## 2023-03-31 NOTE — Telephone Encounter (Signed)
Pt called in asking for another referral that's sooner than June with [facility].

## 2023-04-01 ENCOUNTER — Telehealth (HOSPITAL_COMMUNITY): Payer: Self-pay | Admitting: Emergency Medicine

## 2023-04-01 NOTE — Telephone Encounter (Signed)
Lvm for pt informing her of what the referral coordinator said.
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
## 2023-04-04 ENCOUNTER — Ambulatory Visit: Payer: 59

## 2023-04-04 ENCOUNTER — Ambulatory Visit: Payer: 59 | Admitting: Urology

## 2023-04-04 NOTE — Progress Notes (Deleted)
Name: [name]   MRN: [id number]    DOB: [birth year]   Date:04/04/2023       Progress Note  Subjective  Chief Complaint  No chief complaint on file.   HPI  Patient presents for annual CPE.  Diet: *** Exercise: ***  Last Eye Exam: *** Last Dental Exam: ***  Flowsheet Row Telemedicine from 12/22/2022 in [facility]  AUDIT-C Score 1      Depression: Phq 9 is  {Desc; negative/positive:13464}    02/22/2023    2:17 PM 02/11/2023    2:00 PM 01/04/2023    1:45 PM 12/22/2022   10:51 AM 12/16/2022    8:01 AM  Depression screen PHQ 2/9  Decreased Interest 0 0 2 0 0  Down, Depressed, Hopeless 0 0 2 0 0  PHQ - 2 Score 0 0 4 0 0  Altered sleeping 0 0 3 0 0  Tired, decreased energy 0 0 3 0 0  Change in appetite 0 0 3 0 0  Feeling bad or failure about yourself  0 0 0 0 0  Trouble concentrating 0 0 0 0 0  Moving slowly or fidgety/restless 0 0 0 0 0  Suicidal thoughts 0 0 0 0 0  PHQ-9 Score 0 0 13 0 0  Difficult doing work/chores Not difficult at all Not difficult at all  Not difficult at all Not difficult at all   Hypertension: BP Readings from Last 3 Encounters:  03/25/23 116/70  03/13/23 (!) 126/94  02/22/23 (!) 154/80   Obesity: Wt Readings from Last 3 Encounters:  03/25/23 193 lb 11.2 oz (87.9 kg)  03/12/23 201 lb 8 oz (91.4 kg)  02/22/23 201 lb 11.2 oz (91.5 kg)   BMI Readings from Last 3 Encounters:  03/25/23 31.26 kg/m  03/12/23 32.52 kg/m  02/22/23 32.56 kg/m     Vaccines:  Tdap: UTD Shingrix: Due, discussed  Pneumonia: 2023 Flu: UTD COVID-19: 2021   Hep C Screening: 2023 STD testing and prevention (HIV/chl/gon/syphilis):  Intimate partner violence: {Desc; negative/positive:13464} screen  Menstrual History/LMP/Abnormal Bleeding:  Discussed importance of follow up if any post-menopausal bleeding: {Response; yes/no/na:63}  Incontinence Symptoms: {Desc; negative/positive:13464} for symptoms   Breast cancer:  - Last Mammogram:  9/23  Osteoporosis Prevention : Discussed high calcium and vitamin D supplementation, weight bearing exercises Bone density :{Response; yes/no/na:63}   Cervical cancer screening: Due  Skin cancer: Discussed monitoring for atypical lesions  Colorectal cancer: 06/2022   Lung cancer:  Low Dose CT Chest recommended if Age 23-80 years, 20 pack-year currently smoking OR have quit w/in 15years. Patient does qualify for screen. Had screening 3/24 ECG: 07/06/22  Advanced Care Planning: A voluntary discussion about advance care planning including the explanation and discussion of advance directives.  Discussed health care proxy and Living will, and the patient was able to identify a health care proxy as ***.  Patient {DOES_DOES AOZ:30865} have a living will and power of attorney of health care   Lipids: Lab Results  Component Value Date   CHOL 154 11/15/2022   CHOL  03/13/2010    98        ATP III CLASSIFICATION:  <200     mg/dL   Desirable  784-696  mg/dL   Borderline High  >=295    mg/dL   High          Lab Results  Component Value Date   HDL 58 11/15/2022   HDL 24 (L) 03/13/2010   Lab Results  Component Value Date   LDLCALC 78 11/15/2022   LDLCALC  03/13/2010    61        Total Cholesterol/HDL:CHD Risk Coronary Heart Disease Risk Table                     Men   Women  1/2 Average Risk   3.4   3.3  Average Risk       5.0   4.4  2 X Average Risk   9.6   7.1  3 X Average Risk  23.4   11.0        Use the calculated Patient Ratio above and the CHD Risk Table to determine the patient's CHD Risk.        ATP III CLASSIFICATION (LDL):  <100     mg/dL   Optimal  161-096  mg/dL   Near or Above                    Optimal  130-159  mg/dL   Borderline  045-409  mg/dL   High  >811     mg/dL   Very High   Lab Results  Component Value Date   TRIG 98 11/15/2022   TRIG 66 03/13/2010   Lab Results  Component Value Date   CHOLHDL 2.7 11/15/2022   CHOLHDL 4.1 03/13/2010   No  results found for: "LDLDIRECT"  Glucose: Glucose, Bld  Date Value Ref Range Status  03/12/2023 108 (H) 70 - 99 mg/dL Final    Comment:    Glucose reference range applies only to samples taken after fasting for at least 8 hours.  03/12/2023 114 (H) 70 - 99 mg/dL Final    Comment:    Glucose reference range applies only to samples taken after fasting for at least 8 hours.  02/22/2023 111 (H) 65 - 99 mg/dL Final    Comment:    .            Fasting reference interval . For someone without known diabetes, a glucose value between 100 and 125 mg/dL is consistent with prediabetes and should be confirmed with a follow-up test. .    Glucose-Capillary  Date Value Ref Range Status  07/09/2022 159 (H) 70 - 99 mg/dL Final    Comment:    Glucose reference range applies only to samples taken after fasting for at least 8 hours.  07/09/2022 86 70 - 99 mg/dL Final    Comment:    Glucose reference range applies only to samples taken after fasting for at least 8 hours.  07/08/2022 267 (H) 70 - 99 mg/dL Final    Comment:    Glucose reference range applies only to samples taken after fasting for at least 8 hours.    Patient Active Problem List   Diagnosis Date Noted   Sepsis due to pneumonia 07/06/2022   Severe sepsis 07/06/2022   Type 2 diabetes mellitus with peripheral neuropathy 07/06/2022   Hypothyroidism, unspecified 07/06/2022   Normocytic anemia 06/19/2022   Complete rectal prolapse 06/18/2022   Chronic respiratory failure with hypoxia 06/18/2022   GERD (gastroesophageal reflux disease) 04/06/2022   Restless leg syndrome 04/06/2022   Seizures 04/06/2022   Vomiting without nausea 04/06/2022   AKI (acute kidney injury) 04/06/2022   Delayed gastric emptying 04/06/2022   Anxiety and depression 01/25/2021   Acute on chronic respiratory failure with hypoxia 11/17/2020   Protein calorie malnutrition 11/17/2020   Hypothyroidism 11/17/2020   Leg pain 02/05/2020  Myofascial pain  syndrome 08/23/2018   Lumbar spondylosis 08/23/2018   Leg swelling 07/31/2018   Tobacco abuse 07/31/2018   Fusion of lumbar spine (L3-S1) 07/20/2018   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   Lumbar degenerative disc disease 07/20/2018   Drug overdose 04/18/2014   Chronic pain syndrome 04/18/2014   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 04/15/2014   Respiratory failure 04/14/2014   UTI (urinary tract infection) 04/28/2013   Weakness generalized 04/28/2013   Chronic diastolic CHF (congestive heart failure) (HCC) 04/28/2013   Sinus tachycardia 01/11/2013   COPD with acute exacerbation (HCC) 01/10/2013   Diastolic CHF, acute on chronic (HCC) 01/10/2013   Osteoarthritis of left knee 10/27/2011   COPD (chronic obstructive pulmonary disease) 03/29/2011   Hypertension 03/29/2011    Past Surgical History:  Procedure Laterality Date   CARPAL TUNNEL RELEASE     left   COLONOSCOPY WITH PROPOFOL N/A 06/17/2022   Procedure: COLONOSCOPY WITH PROPOFOL;  Surgeon: Regis Bill, MD;  Location: [facility];  Service: Endoscopy;  Laterality: N/A;   JOINT REPLACEMENT  08/26/2011   right total knee arthroplasty, left total knee   KNEE ARTHROPLASTY  10/28/2011   Procedure: COMPUTER ASSISTED TOTAL KNEE ARTHROPLASTY;  Surgeon: Carlisle Beers Rendall III;  Location: [facility];  Service: Orthopedics;  Laterality: Left;  preop femoral nerve block   KNEE SURGERY     right   laminectomy and diskectomy     L4-5 with fusion   PROCTOSCOPY N/A 06/18/2022   Procedure: RIGID PROCTOSCOPY;  Surgeon: Karie Soda, MD;  Location: [facility];  Service: General;  Laterality: N/A;    Family History  Problem Relation Age of Onset   Heart attack Father 17   Alzheimer's disease Mother    Breast cancer Neg Hx     Social History   Socioeconomic History   Marital status: Widowed    Spouse name: Not on file   Number of children: Not on file   Years of education: Not on file   Highest  education level: Not on file  Occupational History   Not on file  Tobacco Use   Smoking status: Every Day    Packs/day: 2.00    Years: 50.00    Additional pack years: 0.00    Total pack years: 100.00    Types: Cigarettes   Smokeless tobacco: Never  Vaping Use   Vaping Use: Every day   Substances: Nicotine  Substance and Sexual Activity   Alcohol use: Yes    Comment: occasional   Drug use: No   Sexual activity: Yes    Birth control/protection: Post-menopausal  Other Topics Concern   Not on file  Social History Narrative   ** Merged History Encounter **       Social Determinants of Health   Financial Resource Strain: Not on file  Food Insecurity: No Food Insecurity (11/18/2020)   Hunger Vital Sign    Worried About Running Out of Food in the Last Year: Never true    Ran Out of Food in the Last Year: Never true  Transportation Needs: No Transportation Needs (11/18/2020)   PRAPARE - Administrator, Civil Service (Medical): No    Lack of Transportation (Non-Medical): No  Physical Activity: Not on file  Stress: Not on file  Social Connections: Not on file  Intimate Partner Violence: Not on file     Current Outpatient Medications:    acidophilus (RISAQUAD) CAPS capsule, Take 1 capsule by mouth daily.,  Disp: , Rfl:    albuterol (VENTOLIN HFA) 108 (90 Base) MCG/ACT inhaler, Inhale 2 puffs into the lungs every 4 (four) hours as needed for wheezing or shortness of breath., Disp: 18 g, Rfl: 11   ARIPiprazole (ABILIFY) 10 MG tablet, Take 10 mg by mouth daily., Disp: , Rfl:    busPIRone (BUSPAR) 30 MG tablet, Take 30 mg by mouth 2 (two) times daily., Disp: , Rfl:    diltiazem (CARDIZEM CD) 180 MG 24 hr capsule, Take 180 mg by mouth daily., Disp: , Rfl:    escitalopram (LEXAPRO) 20 MG tablet, Take 30 mg by mouth daily., Disp: , Rfl:    famotidine (PEPCID) 40 MG tablet, Take 40 mg by mouth at bedtime., Disp: , Rfl:    fenofibrate micronized (LOFIBRA) 134 MG capsule, Take  134 mg by mouth daily before breakfast. , Disp: , Rfl:    Fluticasone-Umeclidin-Vilant (TRELEGY ELLIPTA) 100-62.5-25 MCG/ACT AEPB, Inhale 1 puff into the lungs daily., Disp: , Rfl:    furosemide (LASIX) 40 MG tablet, Take 1 tablet (40 mg total) by mouth daily. May take additional 40 mg (one tablet) as needed for swelling, weight gain or sob, Disp: 30 tablet, Rfl: 0   gabapentin (NEURONTIN) 800 MG tablet, Take 800 mg by mouth 3 (three) times daily. , Disp: , Rfl:    levothyroxine (SYNTHROID, LEVOTHROID) 25 MCG tablet, Take 25 mcg by mouth daily before breakfast. , Disp: , Rfl:    losartan (COZAAR) 100 MG tablet, Take 100 mg by mouth daily. , Disp: , Rfl:    OXYGEN, Inhale 3 L into the lungs at bedtime., Disp: , Rfl:    pantoprazole (PROTONIX) 40 MG tablet, Take 40 mg by mouth every morning. , Disp: , Rfl:    pramipexole (MIRAPEX) 0.5 MG tablet, Take 0.5 mg by mouth at bedtime., Disp: , Rfl:    tiZANidine (ZANAFLEX) 4 MG tablet, Take 1 tablet (4 mg total) by mouth every 6 (six) hours as needed for muscle spasms., Disp: 30 tablet, Rfl: 0   traMADol (ULTRAM) 50 MG tablet, Take 1 tablet (50 mg total) by mouth daily as needed for severe pain., Disp: 30 tablet, Rfl: 0   traZODone (DESYREL) 50 MG tablet, Take 0.5-1 tablets (25-50 mg total) by mouth at bedtime as needed for sleep., Disp: 30 tablet, Rfl: 3   Vibegron (GEMTESA) 75 MG TABS, Take 75 mg by mouth daily., Disp: 30 tablet, Rfl: 0 No current facility-administered medications for this visit.  Facility-Administered Medications Ordered in Other Visits:    chlorhexidine (HIBICLENS) 4 % liquid 4 application, 60 mL, Topical, Once, Duffy, Lucienne Minks, PA-C   chlorhexidine (HIBICLENS) 4 % liquid 4 application, 60 mL, Topical, Once, Duffy, Lucienne Minks, PA-C  Allergies  Allergen Reactions   Nsaids Other (See Comments)    Pt states it messes up her kidneys But takes aspirin & ibuprofen   Thiazide-Type Diuretics     Other reaction(s): Unknown      ROS  ***  Objective  There were no vitals filed for this visit.  There is no height or weight on file to calculate BMI.  Physical Exam ***  Recent Results (from the past 2160 hour(s))  DRUG MONITOR, PANEL 1, W/CONF, URINE     Status: Abnormal   Collection Time: 02/11/23  2:35 PM  Result Value Ref Range   Amphetamines NEGATIVE <500 ng/mL   Barbiturates NEGATIVE <300 ng/mL   Benzodiazepines POSITIVE (A) <100 ng/mL   Alphahydroxyalprazolam 75 (H) <25 ng/mL   Alphahydroxymidazolam NEGATIVE <  50 ng/mL   Alphahydroxytriazolam NEGATIVE <50 ng/mL   Aminoclonazepam NEGATIVE <25 ng/mL   Hydroxyethylflurazepam NEGATIVE <50 ng/mL   Lorazepam NEGATIVE <50 ng/mL   Nordiazepam NEGATIVE <50 ng/mL   Oxazepam NEGATIVE <50 ng/mL   Temazepam NEGATIVE <50 ng/mL   Benzodiazepines Comments      Comment: See Benzodiazepines Notes, LDT Notes   Cocaine Metabolite NEGATIVE <150 ng/mL   Marijuana Metabolite NEGATIVE <20 ng/mL   Methadone Metabolite NEGATIVE <100 ng/mL   Opiates NEGATIVE <100 ng/mL   Oxycodone NEGATIVE <100 ng/mL   Phencyclidine NEGATIVE <25 ng/mL   Creatinine 26.2 > or = 20.0 mg/dL   pH 7.4 4.5 - 9.0   Oxidant NEGATIVE <200 mcg/mL  DM TEMPLATE     Status: None   Collection Time: 02/11/23  2:35 PM  Result Value Ref Range   Notes and Comments      Comment: This drug testing is for medical treatment only. Analysis was performed as non-forensic testing and these results should be used only by healthcare providers to render diagnosis or treatment, or to monitor progress of medical conditions. . Benzodiazepines Notes: aOH Alprazolam detected is consistent with the use of  the drug Alprazolam. . LDT Notes: Confirmation tests were developed and their analytical  performance characteristics have been determined by  [employer]. It has not been cleared or approved  by the FDA. This assay has been validated pursuant to  the CLIA regulations and is used for clinical  purposes. . . Healthcare Providers needing Interpretation assistance,  please contact us at [phone] ([phone])  M-F, 8am to 10pm EST   POCT urinalysis dipstick     Status: Abnormal   Collection Time: 02/22/23  2:25 PM  Result Value Ref Range   Color, UA yellow    Clarity, UA cloudy    Glucose, UA Negative Negative   Bilirubin, UA neg    Ketones, UA neg    Spec Grav, UA 1.020 1.010 - 1.025   Blood, UA neg    pH, UA 6.0 5.0 - 8.0   Protein, UA Negative Negative   Urobilinogen, UA 0.2 0.2 or 1.0 E.U./dL   Nitrite, UA positive    Leukocytes, UA Large (3+) (A) Negative   Appearance cloudy    Odor foul   Urine Culture     Status: Abnormal   Collection Time: 02/22/23  2:43 PM   Specimen: Urine  Result Value Ref Range   MICRO NUMBER: [id number]    SPECIMEN QUALITY: Adequate    Sample Source URINE    STATUS: FINAL    ISOLATE 1: Klebsiella pneumoniae (A)     Comment: Greater than 100,000 CFU/mL of Klebsiella pneumoniae      Susceptibility   Klebsiella pneumoniae - URINE CULTURE, REFLEX    AMOX/CLAVULANIC <=2 Sensitive     AMPICILLIN 16 Resistant     AMPICILLIN/SULBACTAM <=2 Sensitive     CEFAZOLIN* <=4 Not Reportable      * For infections other than uncomplicated UTI caused by E. coli, K. pneumoniae or P. mirabilis: Cefazolin is resistant if MIC > or = 8 mcg/mL. (Distinguishing susceptible versus intermediate for isolates with MIC < or = 4 mcg/mL requires additional testing.) For uncomplicated UTI caused by E. coli, K. pneumoniae or P. mirabilis: Cefazolin is susceptible if MIC <32 mcg/mL and predicts susceptible to the oral agents cefaclor, cefdinir, cefpodoxime, cefprozil, cefuroxime, cephalexin and loracarbef.     CEFTAZIDIME <=1 Sensitive     CEFEPIME <=1 Sensitive     CEFTRIAXONE <=  1 Sensitive     CIPROFLOXACIN <=0.25 Sensitive     LEVOFLOXACIN <=0.12 Sensitive     GENTAMICIN <=1 Sensitive     IMIPENEM <=0.25 Sensitive     NITROFURANTOIN <=16  Sensitive     PIP/TAZO <=4 Sensitive     TOBRAMYCIN <=1 Sensitive     TRIMETH/SULFA* <=20 Sensitive      * For infections other than uncomplicated UTI caused by E. coli, K. pneumoniae or P. mirabilis: Cefazolin is resistant if MIC > or = 8 mcg/mL. (Distinguishing susceptible versus intermediate for isolates with MIC < or = 4 mcg/mL requires additional testing.) For uncomplicated UTI caused by E. coli, K. pneumoniae or P. mirabilis: Cefazolin is susceptible if MIC <32 mcg/mL and predicts susceptible to the oral agents cefaclor, cefdinir, cefpodoxime, cefprozil, cefuroxime, cephalexin and loracarbef. Legend: S = Susceptible  I = Intermediate R = Resistant  NS = Not susceptible * = Not tested  NR = Not reported **NN = See antimicrobic comments   TSH     Status: None   Collection Time: 02/22/23  2:47 PM  Result Value Ref Range   TSH 2.01 0.40 - 4.50 mIU/L  Fe+TIBC+Fer     Status: None   Collection Time: 02/22/23  2:47 PM  Result Value Ref Range   Iron 60 45 - 160 mcg/dL   TIBC 960 454 - 098 mcg/dL (calc)   %SAT 16 16 - 45 % (calc)   Ferritin 84 16 - 288 ng/mL  Vitamin D (25 hydroxy)     Status: None   Collection Time: 02/22/23  2:47 PM  Result Value Ref Range   Vit D, 25-Hydroxy 58 30 - 100 ng/mL    Comment: Vitamin D Status         25-OH Vitamin D: . Deficiency:                    <20 ng/mL Insufficiency:             20 - 29 ng/mL Optimal:                 > or = 30 ng/mL . For 25-OH Vitamin D testing on patients on  D2-supplementation and patients for whom quantitation  of D2 and D3 fractions is required, the [facility](TM) 25-OH VIT D, (D2,D3), LC/MS/MS is recommended: order  code 11914 (patients >83yrs). . See Note 1 . Note 1 . For additional information, please refer to  http://education.[facility].com/faq/FAQ199  (This link is being provided for informational/ educational purposes only.)   Vitamin B1     Status: None   Collection Time: 02/22/23  2:47  PM  Result Value Ref Range   Vitamin B1 (Thiamine) 16 8 - 30 nmol/L    Comment: (Note) Vitamin supplementation within 24 hours prior to blood  draw may affect the accuracy of the results. . This test was developed and its analytical performance  characteristics have been determined by [employer]. It has not been cleared or approved by FDA.  This assay has been validated pursuant to the CLIA  regulations and is used for clinical purposes. . [facility] med fusion [address] [facility] [address] [place] [address] [phone] Doris Cheadle. [place], MD   HgB A1c     Status: None   Collection Time: 02/22/23  2:47 PM  Result Value Ref Range   Hgb A1c MFr Bld 5.6 <5.7 % of total Hgb    Comment: For the purpose of screening for the  presence of diabetes: . <5.7%       Consistent with the absence of diabetes 5.7-6.4%    Consistent with increased risk for diabetes             (prediabetes) > or =6.5%  Consistent with diabetes . This assay result is consistent with a decreased risk of diabetes. . Currently, no consensus exists regarding use of hemoglobin A1c for diagnosis of diabetes in children. . According to American Diabetes Association (ADA) guidelines, hemoglobin A1c <7.0% represents optimal control in non-pregnant diabetic patients. Different metrics may apply to specific patient populations.  Standards of Medical Care in Diabetes(ADA). .    Mean Plasma Glucose 114 mg/dL   eAG (mmol/L) 6.3 mmol/L    Comment: . This test was performed on the Roche cobas c503 platform. Effective 09/20/22, a change in test platforms from the Abbott Architect to the Roche cobas c503 may have shifted HbA1c results compared to historical results. Based on laboratory validation testing conducted at [facility], the Roche platform relative to the Abbott platform had an average increase in HbA1c value of < or = 0.3%. This difference is within accepted  variability established by the  [facility]. Note that not all individuals will have had a shift in their results and direct comparisons between historical and current results for testing conducted on different platforms is not recommended.   COMPLETE METABOLIC PANEL WITH GFR     Status: Abnormal   Collection Time: 02/22/23  2:47 PM  Result Value Ref Range   Glucose, Bld 111 (H) 65 - 99 mg/dL    Comment: .            Fasting reference interval . For someone without known diabetes, a glucose value between 100 and 125 mg/dL is consistent with prediabetes and should be confirmed with a follow-up test. .    BUN 15 7 - 25 mg/dL   Creat 1.61 (H) 0.96 - 1.05 mg/dL   eGFR 48 (L) > OR = 60 mL/min/1.73m2   BUN/Creatinine Ratio 12 6 - 22 (calc)   Sodium 131 (L) 135 - 146 mmol/L   Potassium 4.0 3.5 - 5.3 mmol/L   Chloride 95 (L) 98 - 110 mmol/L   CO2 23 20 - 32 mmol/L   Calcium 10.0 8.6 - 10.4 mg/dL   Total Protein 6.9 6.1 - 8.1 g/dL   Albumin 4.0 3.6 - 5.1 g/dL   Globulin 2.9 1.9 - 3.7 g/dL (calc)   AG Ratio 1.4 1.0 - 2.5 (calc)   Total Bilirubin 0.5 0.2 - 1.2 mg/dL   Alkaline phosphatase (APISO) 24 (L) 37 - 153 U/L   AST 29 10 - 35 U/L   ALT 18 6 - 29 U/L  DRUG MONITOR, PANEL 1, W/CONF, URINE     Status: None   Collection Time: 02/22/23  2:47 PM  Result Value Ref Range   Amphetamines NEGATIVE <500 ng/mL   Barbiturates NEGATIVE <300 ng/mL   Benzodiazepines NEGATIVE <100 ng/mL   Cocaine Metabolite NEGATIVE <150 ng/mL   Marijuana Metabolite NEGATIVE <20 ng/mL   Methadone Metabolite NEGATIVE <100 ng/mL   Opiates NEGATIVE <100 ng/mL   Oxycodone NEGATIVE <100 ng/mL   Phencyclidine NEGATIVE <25 ng/mL   Creatinine 35.3 > or = 20.0 mg/dL   pH 7.3 4.5 - 9.0   Oxidant NEGATIVE <200 mcg/mL  DM TEMPLATE     Status: None   Collection Time: 02/22/23  2:47 PM  Result Value Ref Range   Notes and Comments  Comment: This drug testing is for medical treatment only. Analysis was  performed as non-forensic testing and these results should be used only by healthcare providers to render diagnosis or treatment, or to monitor progress of medical conditions. . . Healthcare Providers needing Interpretation assistance,  please contact us at [phone] ([phone])  M-F, 8am to 10pm EST   Comprehensive metabolic panel     Status: Abnormal   Collection Time: 03/12/23 11:42 PM  Result Value Ref Range   Sodium 131 (L) 135 - 145 mmol/L   Potassium 4.7 3.5 - 5.1 mmol/L   Chloride 90 (L) 98 - 111 mmol/L   CO2 29 22 - 32 mmol/L   Glucose, Bld 114 (H) 70 - 99 mg/dL    Comment: Glucose reference range applies only to samples taken after fasting for at least 8 hours.   BUN 25 (H) 8 - 23 mg/dL   Creatinine, Ser 1.61 (H) 0.44 - 1.00 mg/dL   Calcium 9.6 8.9 - 09.6 mg/dL   Total Protein 6.9 6.5 - 8.1 g/dL   Albumin 3.8 3.5 - 5.0 g/dL   AST 33 15 - 41 U/L   ALT 27 0 - 44 U/L   Alkaline Phosphatase 20 (L) 38 - 126 U/L   Total Bilirubin 0.9 0.3 - 1.2 mg/dL   GFR, Estimated 29 (L) >60 mL/min    Comment: (NOTE) Calculated using the CKD-EPI Creatinine Equation (2021)    Anion gap 12 5 - 15    Comment: Performed at [facility], [address], [place], [place] [address]  CBC     Status: Abnormal   Collection Time: 03/12/23 11:42 PM  Result Value Ref Range   WBC 12.6 (H) 4.0 - 10.5 K/uL   RBC 4.51 3.87 - 5.11 MIL/uL   Hemoglobin 14.8 12.0 - 15.0 g/dL   HCT 98.1 19.1 - 47.8 %   MCV 99.8 80.0 - 100.0 fL   MCH 32.8 26.0 - 34.0 pg   MCHC 32.9 30.0 - 36.0 g/dL   RDW 29.5 62.1 - 30.8 %   Platelets 279 150 - 400 K/uL   nRBC 0.0 0.0 - 0.2 %    Comment: Performed at [facility], [address] [place], [place] [address]  Lactic acid, plasma     Status: None   Collection Time: 03/12/23 11:42 PM  Result Value Ref Range   Lactic Acid, Venous 0.7 0.5 - 1.9 mmol/L    Comment: Performed at [facility], [address] [place], [place] [address]   Protime-INR     Status: None   Collection Time: 03/12/23 11:42 PM  Result Value Ref Range   Prothrombin Time 13.5 11.4 - 15.2 seconds   INR 1.0 0.8 - 1.2    Comment: (NOTE) INR goal varies based on device and disease states. Performed at [facility], [address] [place], [place] [address]   I-Stat Chem 8, ED     Status: Abnormal   Collection Time: 03/12/23 11:51 PM  Result Value Ref Range   Sodium 130 (L) 135 - 145 mmol/L   Potassium 4.5 3.5 - 5.1 mmol/L   Chloride 91 (L) 98 - 111 mmol/L   BUN 30 (H) 8 - 23 mg/dL   Creatinine, Ser 3.24 (H) 0.44 - 1.00 mg/dL   Glucose, Bld 401 (H) 70 - 99 mg/dL    Comment: Glucose reference range applies only to samples taken after fasting for at least 8 hours.  Calcium, Ion 1.19 1.15 - 1.40 mmol/L   TCO2 32 22 - 32 mmol/L   Hemoglobin 16.0 (H) 12.0 - 15.0 g/dL   HCT 40.9 (H) 81.1 - 91.4 %     Fall Risk:    03/25/2023    2:09 PM 02/22/2023    2:16 PM 02/11/2023    1:57 PM 01/04/2023    1:45 PM 12/22/2022   10:50 AM  Fall Risk   Falls in the past year? 0 0 0 1 0  Number falls in past yr: 0 0 0 0 0  Injury with Fall? 0 0 0 1 0  Risk for fall due to :    Impaired balance/gait Impaired balance/gait  Follow up    Falls prevention discussed Falls prevention discussed;Education provided;Falls evaluation completed   ***  Functional Status Survey:   ***  Assessment & Plan  There are no diagnoses linked to this encounter.  -USPSTF grade A and B recommendations reviewed with patient; age-appropriate recommendations, preventive care, screening tests, etc discussed and encouraged; healthy living encouraged; see AVS for patient education given to patient -Discussed importance of 150 minutes of physical activity weekly, eat two servings of fish weekly, eat one serving of tree nuts ( cashews, pistachios, pecans, almonds.Marland Kitchen) every other day, eat 6 servings of fruit/vegetables daily and drink plenty of water and avoid sweet beverages.    -Reviewed Health Maintenance: {yes [id number]}

## 2023-04-05 ENCOUNTER — Encounter: Payer: Medicaid Other | Admitting: Internal Medicine

## 2023-04-13 NOTE — Progress Notes (Deleted)
Name: [name]   MRN: [id number]    DOB: 12-25-1955   Date:04/13/2023       Progress Note  Subjective  Chief Complaint  No chief complaint on file.   HPI  Patient presents for annual CPE.  Diet: *** Exercise: ***  Last Eye Exam: *** Last Dental Exam: ***  Flowsheet Row Telemedicine from 12/22/2022 in [facility]  AUDIT-C Score 1      Depression: Phq 9 is  {Desc; negative/positive:13464}    02/22/2023    2:17 PM 02/11/2023    2:00 PM 01/04/2023    1:45 PM 12/22/2022   10:51 AM 12/16/2022    8:01 AM  Depression screen PHQ 2/9  Decreased Interest 0 0 2 0 0  Down, Depressed, Hopeless 0 0 2 0 0  PHQ - 2 Score 0 0 4 0 0  Altered sleeping 0 0 3 0 0  Tired, decreased energy 0 0 3 0 0  Change in appetite 0 0 3 0 0  Feeling bad or failure about yourself  0 0 0 0 0  Trouble concentrating 0 0 0 0 0  Moving slowly or fidgety/restless 0 0 0 0 0  Suicidal thoughts 0 0 0 0 0  PHQ-9 Score 0 0 13 0 0  Difficult doing work/chores Not difficult at all Not difficult at all  Not difficult at all Not difficult at all   Hypertension: BP Readings from Last 3 Encounters:  03/25/23 116/70  03/13/23 (!) 126/94  02/22/23 (!) 154/80   Obesity: Wt Readings from Last 3 Encounters:  03/25/23 193 lb 11.2 oz (87.9 kg)  03/12/23 201 lb 8 oz (91.4 kg)  02/22/23 201 lb 11.2 oz (91.5 kg)   BMI Readings from Last 3 Encounters:  03/25/23 31.26 kg/m  03/12/23 32.52 kg/m  02/22/23 32.56 kg/m     Vaccines:  Tdap: UTD Shingrix: Due, discussed  Pneumonia: 2023 Flu: UTD COVID-19: 2021   Hep C Screening: 2023 STD testing and prevention (HIV/chl/gon/syphilis):  Intimate partner violence: {Desc; negative/positive:13464} screen  Menstrual History/LMP/Abnormal Bleeding:  Discussed importance of follow up if any post-menopausal bleeding: {Response; yes/no/na:63}  Incontinence Symptoms: {Desc; negative/positive:13464} for symptoms   Breast cancer:  - Last Mammogram:  9/23  Osteoporosis Prevention : Discussed high calcium and vitamin D supplementation, weight bearing exercises Bone density :{Response; yes/no/na:63}   Cervical cancer screening: Due  Skin cancer: Discussed monitoring for atypical lesions  Colorectal cancer: 06/2022   Lung cancer:  Low Dose CT Chest recommended if Age 66-80 years, 20 pack-year currently smoking OR have quit w/in 15years. Patient does qualify for screen. Had screening 3/24 ECG: 07/06/22  Advanced Care Planning: A voluntary discussion about advance care planning including the explanation and discussion of advance directives.  Discussed health care proxy and Living will, and the patient was able to identify a health care proxy as ***.  Patient {DOES_DOES NWG:95621} have a living will and power of attorney of health care   Lipids: Lab Results  Component Value Date   CHOL 154 11/15/2022   CHOL  03/13/2010    98        ATP III CLASSIFICATION:  <200     mg/dL   Desirable  308-657  mg/dL   Borderline High  >=846    mg/dL   High          Lab Results  Component Value Date   HDL 58 11/15/2022   HDL 24 (L) 03/13/2010   Lab Results  Component Value Date   LDLCALC 78 11/15/2022   LDLCALC  03/13/2010    61        Total Cholesterol/HDL:CHD Risk Coronary Heart Disease Risk Table                     Men   Women  1/2 Average Risk   3.4   3.3  Average Risk       5.0   4.4  2 X Average Risk   9.6   7.1  3 X Average Risk  23.4   11.0        Use the calculated Patient Ratio above and the CHD Risk Table to determine the patient's CHD Risk.        ATP III CLASSIFICATION (LDL):  <100     mg/dL   Optimal  416-606  mg/dL   Near or Above                    Optimal  130-159  mg/dL   Borderline  301-601  mg/dL   High  >093     mg/dL   Very High   Lab Results  Component Value Date   TRIG 98 11/15/2022   TRIG 66 03/13/2010   Lab Results  Component Value Date   CHOLHDL 2.7 11/15/2022   CHOLHDL 4.1 03/13/2010   No  results found for: "LDLDIRECT"  Glucose: Glucose, Bld  Date Value Ref Range Status  03/12/2023 108 (H) 70 - 99 mg/dL Final    Comment:    Glucose reference range applies only to samples taken after fasting for at least 8 hours.  03/12/2023 114 (H) 70 - 99 mg/dL Final    Comment:    Glucose reference range applies only to samples taken after fasting for at least 8 hours.  02/22/2023 111 (H) 65 - 99 mg/dL Final    Comment:    .            Fasting reference interval . For someone without known diabetes, a glucose value between 100 and 125 mg/dL is consistent with prediabetes and should be confirmed with a follow-up test. .    Glucose-Capillary  Date Value Ref Range Status  07/09/2022 159 (H) 70 - 99 mg/dL Final    Comment:    Glucose reference range applies only to samples taken after fasting for at least 8 hours.  07/09/2022 86 70 - 99 mg/dL Final    Comment:    Glucose reference range applies only to samples taken after fasting for at least 8 hours.  07/08/2022 267 (H) 70 - 99 mg/dL Final    Comment:    Glucose reference range applies only to samples taken after fasting for at least 8 hours.    Patient Active Problem List   Diagnosis Date Noted   Sepsis due to pneumonia (HCC) 07/06/2022   Severe sepsis (HCC) 07/06/2022   Type 2 diabetes mellitus with peripheral neuropathy (HCC) 07/06/2022   Hypothyroidism, unspecified 07/06/2022   Normocytic anemia 06/19/2022   Complete rectal prolapse 06/18/2022   Chronic respiratory failure with hypoxia (HCC) 06/18/2022   GERD (gastroesophageal reflux disease) 04/06/2022   Restless leg syndrome 04/06/2022   Seizures (HCC) 04/06/2022   Vomiting without nausea 04/06/2022   AKI (acute kidney injury) (HCC) 04/06/2022   Delayed gastric emptying 04/06/2022   Anxiety and depression 01/25/2021   Acute on chronic respiratory failure with hypoxia (HCC) 11/17/2020   Protein calorie malnutrition (HCC) 11/17/2020  Hypothyroidism 11/17/2020    Leg pain 02/05/2020   Myofascial pain syndrome 08/23/2018   Lumbar spondylosis 08/23/2018   Leg swelling 07/31/2018   Tobacco abuse 07/31/2018   Fusion of lumbar spine (L3-S1) 07/20/2018   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   Lumbar degenerative disc disease 07/20/2018   Drug overdose 04/18/2014   Chronic pain syndrome 04/18/2014   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) 04/15/2014   Respiratory failure (HCC) 04/14/2014   UTI (urinary tract infection) 04/28/2013   Weakness generalized 04/28/2013   Chronic diastolic CHF (congestive heart failure) (HCC) 04/28/2013   Sinus tachycardia 01/11/2013   COPD with acute exacerbation (HCC) 01/10/2013   Diastolic CHF, acute on chronic (HCC) 01/10/2013   Osteoarthritis of left knee 10/27/2011   COPD (chronic obstructive pulmonary disease) (HCC) 03/29/2011   Hypertension 03/29/2011    Past Surgical History:  Procedure Laterality Date   CARPAL TUNNEL RELEASE     left   COLONOSCOPY WITH PROPOFOL N/A 06/17/2022   Procedure: COLONOSCOPY WITH PROPOFOL;  Surgeon: Regis Bill, MD;  Location: [facility];  Service: Endoscopy;  Laterality: N/A;   JOINT REPLACEMENT  08/26/2011   right total knee arthroplasty, left total knee   KNEE ARTHROPLASTY  10/28/2011   Procedure: COMPUTER ASSISTED TOTAL KNEE ARTHROPLASTY;  Surgeon: Carlisle Beers Rendall III;  Location: [facility];  Service: Orthopedics;  Laterality: Left;  preop femoral nerve block   KNEE SURGERY     right   laminectomy and diskectomy     L4-5 with fusion   PROCTOSCOPY N/A 06/18/2022   Procedure: RIGID PROCTOSCOPY;  Surgeon: Karie Soda, MD;  Location: [facility];  Service: General;  Laterality: N/A;    Family History  Problem Relation Age of Onset   Heart attack Father 21   Alzheimer's disease Mother    Breast cancer Neg Hx     Social History   Socioeconomic History   Marital status: Widowed    Spouse name: Not on file   Number of children: Not  on file   Years of education: Not on file   Highest education level: Not on file  Occupational History   Not on file  Tobacco Use   Smoking status: Every Day    Packs/day: 2.00    Years: 50.00    Additional pack years: 0.00    Total pack years: 100.00    Types: Cigarettes   Smokeless tobacco: Never  Vaping Use   Vaping Use: Every day   Substances: Nicotine  Substance and Sexual Activity   Alcohol use: Yes    Comment: occasional   Drug use: No   Sexual activity: Yes    Birth control/protection: Post-menopausal  Other Topics Concern   Not on file  Social History Narrative   ** Merged History Encounter **       Social Determinants of Health   Financial Resource Strain: Not on file  Food Insecurity: No Food Insecurity (11/18/2020)   Hunger Vital Sign    Worried About Running Out of Food in the Last Year: Never true    Ran Out of Food in the Last Year: Never true  Transportation Needs: No Transportation Needs (11/18/2020)   PRAPARE - Administrator, Civil Service (Medical): No    Lack of Transportation (Non-Medical): No  Physical Activity: Not on file  Stress: Not on file  Social Connections: Not on file  Intimate Partner Violence: Not on file     Current Outpatient Medications:  acidophilus (RISAQUAD) CAPS capsule, Take 1 capsule by mouth daily., Disp: , Rfl:    albuterol (VENTOLIN HFA) 108 (90 Base) MCG/ACT inhaler, Inhale 2 puffs into the lungs every 4 (four) hours as needed for wheezing or shortness of breath., Disp: 18 g, Rfl: 11   ARIPiprazole (ABILIFY) 10 MG tablet, Take 10 mg by mouth daily., Disp: , Rfl:    busPIRone (BUSPAR) 30 MG tablet, Take 30 mg by mouth 2 (two) times daily., Disp: , Rfl:    diltiazem (CARDIZEM CD) 180 MG 24 hr capsule, Take 180 mg by mouth daily., Disp: , Rfl:    escitalopram (LEXAPRO) 20 MG tablet, Take 30 mg by mouth daily., Disp: , Rfl:    famotidine (PEPCID) 40 MG tablet, Take 40 mg by mouth at bedtime., Disp: , Rfl:     fenofibrate micronized (LOFIBRA) 134 MG capsule, Take 134 mg by mouth daily before breakfast. , Disp: , Rfl:    Fluticasone-Umeclidin-Vilant (TRELEGY ELLIPTA) 100-62.5-25 MCG/ACT AEPB, Inhale 1 puff into the lungs daily., Disp: , Rfl:    furosemide (LASIX) 40 MG tablet, Take 1 tablet (40 mg total) by mouth daily. May take additional 40 mg (one tablet) as needed for swelling, weight gain or sob, Disp: 30 tablet, Rfl: 0   gabapentin (NEURONTIN) 800 MG tablet, Take 800 mg by mouth 3 (three) times daily. , Disp: , Rfl:    levothyroxine (SYNTHROID, LEVOTHROID) 25 MCG tablet, Take 25 mcg by mouth daily before breakfast. , Disp: , Rfl:    losartan (COZAAR) 100 MG tablet, Take 100 mg by mouth daily. , Disp: , Rfl:    OXYGEN, Inhale 3 L into the lungs at bedtime., Disp: , Rfl:    pantoprazole (PROTONIX) 40 MG tablet, Take 40 mg by mouth every morning. , Disp: , Rfl:    pramipexole (MIRAPEX) 0.5 MG tablet, Take 0.5 mg by mouth at bedtime., Disp: , Rfl:    tiZANidine (ZANAFLEX) 4 MG tablet, Take 1 tablet (4 mg total) by mouth every 6 (six) hours as needed for muscle spasms., Disp: 30 tablet, Rfl: 0   traMADol (ULTRAM) 50 MG tablet, Take 1 tablet (50 mg total) by mouth daily as needed for severe pain., Disp: 30 tablet, Rfl: 0   traZODone (DESYREL) 50 MG tablet, Take 0.5-1 tablets (25-50 mg total) by mouth at bedtime as needed for sleep., Disp: 30 tablet, Rfl: 3   Vibegron (GEMTESA) 75 MG TABS, Take 75 mg by mouth daily., Disp: 30 tablet, Rfl: 0 No current facility-administered medications for this visit.  Facility-Administered Medications Ordered in Other Visits:    chlorhexidine (HIBICLENS) 4 % liquid 4 application, 60 mL, Topical, Once, Duffy, Lucienne Minks, PA-C   chlorhexidine (HIBICLENS) 4 % liquid 4 application, 60 mL, Topical, Once, Duffy, Lucienne Minks, PA-C  Allergies  Allergen Reactions   Nsaids Other (See Comments)    Pt states it messes up her kidneys But takes aspirin & ibuprofen   Thiazide-Type Diuretics      Other reaction(s): Unknown     ROS  ***  Objective  There were no vitals filed for this visit.  There is no height or weight on file to calculate BMI.  Physical Exam ***  Recent Results (from the past 2160 hour(s))  DRUG MONITOR, PANEL 1, W/CONF, URINE     Status: Abnormal   Collection Time: 02/11/23  2:35 PM  Result Value Ref Range   Amphetamines NEGATIVE <500 ng/mL   Barbiturates NEGATIVE <300 ng/mL   Benzodiazepines POSITIVE (A) <100 ng/mL  Alphahydroxyalprazolam 75 (H) <25 ng/mL   Alphahydroxymidazolam NEGATIVE <50 ng/mL   Alphahydroxytriazolam NEGATIVE <50 ng/mL   Aminoclonazepam NEGATIVE <25 ng/mL   Hydroxyethylflurazepam NEGATIVE <50 ng/mL   Lorazepam NEGATIVE <50 ng/mL   Nordiazepam NEGATIVE <50 ng/mL   Oxazepam NEGATIVE <50 ng/mL   Temazepam NEGATIVE <50 ng/mL   Benzodiazepines Comments      Comment: See Benzodiazepines Notes, LDT Notes   Cocaine Metabolite NEGATIVE <150 ng/mL   Marijuana Metabolite NEGATIVE <20 ng/mL   Methadone Metabolite NEGATIVE <100 ng/mL   Opiates NEGATIVE <100 ng/mL   Oxycodone NEGATIVE <100 ng/mL   Phencyclidine NEGATIVE <25 ng/mL   Creatinine 26.2 > or = 20.0 mg/dL   pH 7.4 4.5 - 9.0   Oxidant NEGATIVE <200 mcg/mL  DM TEMPLATE     Status: None   Collection Time: 02/11/23  2:35 PM  Result Value Ref Range   Notes and Comments      Comment: This drug testing is for medical treatment only. Analysis was performed as non-forensic testing and these results should be used only by healthcare providers to render diagnosis or treatment, or to monitor progress of medical conditions. . Benzodiazepines Notes: aOH Alprazolam detected is consistent with the use of  the drug Alprazolam. . LDT Notes: Confirmation tests were developed and their analytical  performance characteristics have been determined by  [employer]. It has not been cleared or approved  by the FDA. This assay has been validated pursuant to  the CLIA  regulations and is used for clinical purposes. . . Healthcare Providers needing Interpretation assistance,  please contact us at [phone] ([phone])  M-F, 8am to 10pm EST   POCT urinalysis dipstick     Status: Abnormal   Collection Time: 02/22/23  2:25 PM  Result Value Ref Range   Color, UA yellow    Clarity, UA cloudy    Glucose, UA Negative Negative   Bilirubin, UA neg    Ketones, UA neg    Spec Grav, UA 1.020 1.010 - 1.025   Blood, UA neg    pH, UA 6.0 5.0 - 8.0   Protein, UA Negative Negative   Urobilinogen, UA 0.2 0.2 or 1.0 E.U./dL   Nitrite, UA positive    Leukocytes, UA Large (3+) (A) Negative   Appearance cloudy    Odor foul   Urine Culture     Status: Abnormal   Collection Time: 02/22/23  2:43 PM   Specimen: Urine  Result Value Ref Range   MICRO NUMBER: [id number]    SPECIMEN QUALITY: Adequate    Sample Source URINE    STATUS: FINAL    ISOLATE 1: Klebsiella pneumoniae (A)     Comment: Greater than 100,000 CFU/mL of Klebsiella pneumoniae      Susceptibility   Klebsiella pneumoniae - URINE CULTURE, REFLEX    AMOX/CLAVULANIC <=2 Sensitive     AMPICILLIN 16 Resistant     AMPICILLIN/SULBACTAM <=2 Sensitive     CEFAZOLIN* <=4 Not Reportable      * For infections other than uncomplicated UTI caused by E. coli, K. pneumoniae or P. mirabilis: Cefazolin is resistant if MIC > or = 8 mcg/mL. (Distinguishing susceptible versus intermediate for isolates with MIC < or = 4 mcg/mL requires additional testing.) For uncomplicated UTI caused by E. coli, K. pneumoniae or P. mirabilis: Cefazolin is susceptible if MIC <32 mcg/mL and predicts susceptible to the oral agents cefaclor, cefdinir, cefpodoxime, cefprozil, cefuroxime, cephalexin and loracarbef.     CEFTAZIDIME <=1 Sensitive  CEFEPIME <=1 Sensitive     CEFTRIAXONE <=1 Sensitive     CIPROFLOXACIN <=0.25 Sensitive     LEVOFLOXACIN <=0.12 Sensitive     GENTAMICIN <=1 Sensitive     IMIPENEM <=0.25  Sensitive     NITROFURANTOIN <=16 Sensitive     PIP/TAZO <=4 Sensitive     TOBRAMYCIN <=1 Sensitive     TRIMETH/SULFA* <=20 Sensitive      * For infections other than uncomplicated UTI caused by E. coli, K. pneumoniae or P. mirabilis: Cefazolin is resistant if MIC > or = 8 mcg/mL. (Distinguishing susceptible versus intermediate for isolates with MIC < or = 4 mcg/mL requires additional testing.) For uncomplicated UTI caused by E. coli, K. pneumoniae or P. mirabilis: Cefazolin is susceptible if MIC <32 mcg/mL and predicts susceptible to the oral agents cefaclor, cefdinir, cefpodoxime, cefprozil, cefuroxime, cephalexin and loracarbef. Legend: S = Susceptible  I = Intermediate R = Resistant  NS = Not susceptible * = Not tested  NR = Not reported **NN = See antimicrobic comments   TSH     Status: None   Collection Time: 02/22/23  2:47 PM  Result Value Ref Range   TSH 2.01 0.40 - 4.50 mIU/L  Fe+TIBC+Fer     Status: None   Collection Time: 02/22/23  2:47 PM  Result Value Ref Range   Iron 60 45 - 160 mcg/dL   TIBC 161 096 - 045 mcg/dL (calc)   %SAT 16 16 - 45 % (calc)   Ferritin 84 16 - 288 ng/mL  Vitamin D (25 hydroxy)     Status: None   Collection Time: 02/22/23  2:47 PM  Result Value Ref Range   Vit D, 25-Hydroxy 58 30 - 100 ng/mL    Comment: Vitamin D Status         25-OH Vitamin D: . Deficiency:                    <20 ng/mL Insufficiency:             20 - 29 ng/mL Optimal:                 > or = 30 ng/mL . For 25-OH Vitamin D testing on patients on  D2-supplementation and patients for whom quantitation  of D2 and D3 fractions is required, the [facility](TM) 25-OH VIT D, (D2,D3), LC/MS/MS is recommended: order  code 40981 (patients >76yrs). . See Note 1 . Note 1 . For additional information, please refer to  http://education.[facility].com/faq/FAQ199  (This link is being provided for informational/ educational purposes only.)   Vitamin B1     Status: None    Collection Time: 02/22/23  2:47 PM  Result Value Ref Range   Vitamin B1 (Thiamine) 16 8 - 30 nmol/L    Comment: (Note) Vitamin supplementation within 24 hours prior to blood  draw may affect the accuracy of the results. . This test was developed and its analytical performance  characteristics have been determined by [employer]. It has not been cleared or approved by FDA.  This assay has been validated pursuant to the CLIA  regulations and is used for clinical purposes. . [facility] [address] [facility] [address] [place] [address] [phone] Doris Cheadle. [place], MD   HgB A1c     Status: None   Collection Time: 02/22/23  2:47 PM  Result Value Ref Range   Hgb A1c MFr Bld 5.6 <5.7 % of total Hgb  Comment: For the purpose of screening for the presence of diabetes: . <5.7%       Consistent with the absence of diabetes 5.7-6.4%    Consistent with increased risk for diabetes             (prediabetes) > or =6.5%  Consistent with diabetes . This assay result is consistent with a decreased risk of diabetes. . Currently, no consensus exists regarding use of hemoglobin A1c for diagnosis of diabetes in children. . According to American Diabetes Association (ADA) guidelines, hemoglobin A1c <7.0% represents optimal control in non-pregnant diabetic patients. Different metrics may apply to specific patient populations.  Standards of Medical Care in Diabetes(ADA). .    Mean Plasma Glucose 114 mg/dL   eAG (mmol/L) 6.3 mmol/L    Comment: . This test was performed on the Roche cobas c503 platform. Effective 09/20/22, a change in test platforms from the Abbott Architect to the Roche cobas c503 may have shifted HbA1c results compared to historical results. Based on laboratory validation testing conducted at [facility], the Roche platform relative to the Abbott platform had an average increase in HbA1c value of < or = 0.3%. This difference is within accepted   variability established by the [facility]. Note that not all individuals will have had a shift in their results and direct comparisons between historical and current results for testing conducted on different platforms is not recommended.   COMPLETE METABOLIC PANEL WITH GFR     Status: Abnormal   Collection Time: 02/22/23  2:47 PM  Result Value Ref Range   Glucose, Bld 111 (H) 65 - 99 mg/dL    Comment: .            Fasting reference interval . For someone without known diabetes, a glucose value between 100 and 125 mg/dL is consistent with prediabetes and should be confirmed with a follow-up test. .    BUN 15 7 - 25 mg/dL   Creat 8.11 (H) 9.14 - 1.05 mg/dL   eGFR 48 (L) > OR = 60 mL/min/1.77m2   BUN/Creatinine Ratio 12 6 - 22 (calc)   Sodium 131 (L) 135 - 146 mmol/L   Potassium 4.0 3.5 - 5.3 mmol/L   Chloride 95 (L) 98 - 110 mmol/L   CO2 23 20 - 32 mmol/L   Calcium 10.0 8.6 - 10.4 mg/dL   Total Protein 6.9 6.1 - 8.1 g/dL   Albumin 4.0 3.6 - 5.1 g/dL   Globulin 2.9 1.9 - 3.7 g/dL (calc)   AG Ratio 1.4 1.0 - 2.5 (calc)   Total Bilirubin 0.5 0.2 - 1.2 mg/dL   Alkaline phosphatase (APISO) 24 (L) 37 - 153 U/L   AST 29 10 - 35 U/L   ALT 18 6 - 29 U/L  DRUG MONITOR, PANEL 1, W/CONF, URINE     Status: None   Collection Time: 02/22/23  2:47 PM  Result Value Ref Range   Amphetamines NEGATIVE <500 ng/mL   Barbiturates NEGATIVE <300 ng/mL   Benzodiazepines NEGATIVE <100 ng/mL   Cocaine Metabolite NEGATIVE <150 ng/mL   Marijuana Metabolite NEGATIVE <20 ng/mL   Methadone Metabolite NEGATIVE <100 ng/mL   Opiates NEGATIVE <100 ng/mL   Oxycodone NEGATIVE <100 ng/mL   Phencyclidine NEGATIVE <25 ng/mL   Creatinine 35.3 > or = 20.0 mg/dL   pH 7.3 4.5 - 9.0   Oxidant NEGATIVE <200 mcg/mL  DM TEMPLATE     Status: None   Collection Time: 02/22/23  2:47 PM  Result  Value Ref Range   Notes and Comments      Comment: This drug testing is for medical  treatment only. Analysis was performed as non-forensic testing and these results should be used only by healthcare providers to render diagnosis or treatment, or to monitor progress of medical conditions. . . Healthcare Providers needing Interpretation assistance,  please contact us at [phone] ([phone])  M-F, 8am to 10pm EST   Comprehensive metabolic panel     Status: Abnormal   Collection Time: 03/12/23 11:42 PM  Result Value Ref Range   Sodium 131 (L) 135 - 145 mmol/L   Potassium 4.7 3.5 - 5.1 mmol/L   Chloride 90 (L) 98 - 111 mmol/L   CO2 29 22 - 32 mmol/L   Glucose, Bld 114 (H) 70 - 99 mg/dL    Comment: Glucose reference range applies only to samples taken after fasting for at least 8 hours.   BUN 25 (H) 8 - 23 mg/dL   Creatinine, Ser 0.45 (H) 0.44 - 1.00 mg/dL   Calcium 9.6 8.9 - 40.9 mg/dL   Total Protein 6.9 6.5 - 8.1 g/dL   Albumin 3.8 3.5 - 5.0 g/dL   AST 33 15 - 41 U/L   ALT 27 0 - 44 U/L   Alkaline Phosphatase 20 (L) 38 - 126 U/L   Total Bilirubin 0.9 0.3 - 1.2 mg/dL   GFR, Estimated 29 (L) >60 mL/min    Comment: (NOTE) Calculated using the CKD-EPI Creatinine Equation (2021)    Anion gap 12 5 - 15    Comment: Performed at [facility], [address], [place], [place] [address]  CBC     Status: Abnormal   Collection Time: 03/12/23 11:42 PM  Result Value Ref Range   WBC 12.6 (H) 4.0 - 10.5 K/uL   RBC 4.51 3.87 - 5.11 MIL/uL   Hemoglobin 14.8 12.0 - 15.0 g/dL   HCT 47.8 29.5 - 62.1 %   MCV 99.8 80.0 - 100.0 fL   MCH 32.8 26.0 - 34.0 pg   MCHC 32.9 30.0 - 36.0 g/dL   RDW 30.8 65.7 - 84.6 %   Platelets 279 150 - 400 K/uL   nRBC 0.0 0.0 - 0.2 %    Comment: Performed at [facility], [address], [place], [place] [address]  Lactic acid, plasma     Status: None   Collection Time: 03/12/23 11:42 PM  Result Value Ref Range   Lactic Acid, Venous 0.7 0.5 - 1.9 mmol/L    Comment: Performed at [facility], [address],  [place], [place] [address]  Protime-INR     Status: None   Collection Time: 03/12/23 11:42 PM  Result Value Ref Range   Prothrombin Time 13.5 11.4 - 15.2 seconds   INR 1.0 0.8 - 1.2    Comment: (NOTE) INR goal varies based on device and disease states. Performed at [facility], [address] [place], [place] [address]   I-Stat Chem 8, ED     Status: Abnormal   Collection Time: 03/12/23 11:51 PM  Result Value Ref Range   Sodium 130 (L) 135 - 145 mmol/L   Potassium 4.5 3.5 - 5.1 mmol/L   Chloride 91 (L) 98 - 111 mmol/L   BUN 30 (H) 8 - 23 mg/dL   Creatinine, Ser 0.27 (H) 0.44 - 1.00 mg/dL   Glucose, Bld 253 (H) 70 - 99 mg/dL    Comment: Glucose reference range applies  only to samples taken after fasting for at least 8 hours.   Calcium, Ion 1.19 1.15 - 1.40 mmol/L   TCO2 32 22 - 32 mmol/L   Hemoglobin 16.0 (H) 12.0 - 15.0 g/dL   HCT 19.1 (H) 47.8 - 29.5 %     Fall Risk:    03/25/2023    2:09 PM 02/22/2023    2:16 PM 02/11/2023    1:57 PM 01/04/2023    1:45 PM 12/22/2022   10:50 AM  Fall Risk   Falls in the past year? 0 0 0 1 0  Number falls in past yr: 0 0 0 0 0  Injury with Fall? 0 0 0 1 0  Risk for fall due to :    Impaired balance/gait Impaired balance/gait  Follow up    Falls prevention discussed Falls prevention discussed;Education provided;Falls evaluation completed   ***  Functional Status Survey:   ***  Assessment & Plan  There are no diagnoses linked to this encounter.  -USPSTF grade A and B recommendations reviewed with patient; age-appropriate recommendations, preventive care, screening tests, etc discussed and encouraged; healthy living encouraged; see AVS for patient education given to patient -Discussed importance of 150 minutes of physical activity weekly, eat two servings of fish weekly, eat one serving of tree nuts ( cashews, pistachios, pecans, almonds.Marland Kitchen) every other day, eat 6 servings of fruit/vegetables daily and drink plenty of water and avoid  sweet beverages.   -Reviewed Health Maintenance: {yes [id number]}

## 2023-04-13 NOTE — Telephone Encounter (Signed)
Error

## 2023-04-14 DIAGNOSIS — M4804 Spinal stenosis, thoracic region: Secondary | ICD-10-CM | POA: Diagnosis not present

## 2023-04-14 DIAGNOSIS — M4186 Other forms of scoliosis, lumbar region: Secondary | ICD-10-CM | POA: Diagnosis not present

## 2023-04-14 DIAGNOSIS — M961 Postlaminectomy syndrome, not elsewhere classified: Secondary | ICD-10-CM | POA: Diagnosis not present

## 2023-04-14 DIAGNOSIS — M5136 Other intervertebral disc degeneration, lumbar region: Secondary | ICD-10-CM | POA: Diagnosis not present

## 2023-04-14 DIAGNOSIS — M791 Myalgia, unspecified site: Secondary | ICD-10-CM | POA: Diagnosis not present

## 2023-04-14 DIAGNOSIS — M545 Low back pain, unspecified: Secondary | ICD-10-CM | POA: Diagnosis not present

## 2023-04-14 DIAGNOSIS — M461 Sacroiliitis, not elsewhere classified: Secondary | ICD-10-CM | POA: Diagnosis not present

## 2023-04-14 DIAGNOSIS — M48062 Spinal stenosis, lumbar region with neurogenic claudication: Secondary | ICD-10-CM | POA: Diagnosis not present

## 2023-04-15 ENCOUNTER — Encounter: Payer: 59 | Admitting: Internal Medicine

## 2023-04-17 DIAGNOSIS — J449 Chronic obstructive pulmonary disease, unspecified: Secondary | ICD-10-CM | POA: Diagnosis not present

## 2023-04-20 DIAGNOSIS — M4804 Spinal stenosis, thoracic region: Secondary | ICD-10-CM | POA: Diagnosis not present

## 2023-04-20 DIAGNOSIS — M48062 Spinal stenosis, lumbar region with neurogenic claudication: Secondary | ICD-10-CM | POA: Diagnosis not present

## 2023-04-21 DIAGNOSIS — R0609 Other forms of dyspnea: Secondary | ICD-10-CM | POA: Diagnosis not present

## 2023-04-21 DIAGNOSIS — R0602 Shortness of breath: Secondary | ICD-10-CM | POA: Diagnosis not present

## 2023-04-21 DIAGNOSIS — Z9981 Dependence on supplemental oxygen: Secondary | ICD-10-CM | POA: Diagnosis not present

## 2023-04-21 DIAGNOSIS — J449 Chronic obstructive pulmonary disease, unspecified: Secondary | ICD-10-CM | POA: Diagnosis not present

## 2023-04-22 DIAGNOSIS — M48062 Spinal stenosis, lumbar region with neurogenic claudication: Secondary | ICD-10-CM | POA: Diagnosis not present

## 2023-04-26 ENCOUNTER — Ambulatory Visit: Payer: 59 | Admitting: Internal Medicine

## 2023-04-26 ENCOUNTER — Other Ambulatory Visit: Payer: Self-pay

## 2023-04-26 ENCOUNTER — Encounter: Payer: Self-pay | Admitting: *Deleted

## 2023-04-26 ENCOUNTER — Inpatient Hospital Stay
Admission: EM | Admit: 2023-04-26 | Discharge: 2023-04-29 | DRG: 920 | Disposition: A | Discharge status: Home / Self Care | Payer: 59 | Attending: Internal Medicine | Admitting: Internal Medicine

## 2023-04-26 DIAGNOSIS — Z886 Allergy status to analgesic agent status: Secondary | ICD-10-CM

## 2023-04-26 DIAGNOSIS — E039 Hypothyroidism, unspecified: Secondary | ICD-10-CM | POA: Diagnosis not present

## 2023-04-26 DIAGNOSIS — Z96653 Presence of artificial knee joint, bilateral: Secondary | ICD-10-CM | POA: Diagnosis present

## 2023-04-26 DIAGNOSIS — Z981 Arthrodesis status: Secondary | ICD-10-CM

## 2023-04-26 DIAGNOSIS — T8133XA Disruption of traumatic injury wound repair, initial encounter: Secondary | ICD-10-CM | POA: Diagnosis not present

## 2023-04-26 DIAGNOSIS — I5032 Chronic diastolic (congestive) heart failure: Secondary | ICD-10-CM | POA: Diagnosis not present

## 2023-04-26 DIAGNOSIS — N1832 Chronic kidney disease, stage 3b: Secondary | ICD-10-CM

## 2023-04-26 DIAGNOSIS — J439 Emphysema, unspecified: Secondary | ICD-10-CM | POA: Diagnosis present

## 2023-04-26 DIAGNOSIS — J9612 Chronic respiratory failure with hypercapnia: Secondary | ICD-10-CM | POA: Diagnosis present

## 2023-04-26 DIAGNOSIS — E1142 Type 2 diabetes mellitus with diabetic polyneuropathy: Secondary | ICD-10-CM | POA: Diagnosis present

## 2023-04-26 DIAGNOSIS — Z7951 Long term (current) use of inhaled steroids: Secondary | ICD-10-CM

## 2023-04-26 DIAGNOSIS — Z888 Allergy status to other drugs, medicaments and biological substances status: Secondary | ICD-10-CM

## 2023-04-26 DIAGNOSIS — E875 Hyperkalemia: Secondary | ICD-10-CM | POA: Insufficient documentation

## 2023-04-26 DIAGNOSIS — B9561 Methicillin susceptible Staphylococcus aureus infection as the cause of diseases classified elsewhere: Secondary | ICD-10-CM | POA: Diagnosis present

## 2023-04-26 DIAGNOSIS — J9611 Chronic respiratory failure with hypoxia: Secondary | ICD-10-CM | POA: Diagnosis present

## 2023-04-26 DIAGNOSIS — Z79890 Hormone replacement therapy (postmenopausal): Secondary | ICD-10-CM

## 2023-04-26 DIAGNOSIS — K219 Gastro-esophageal reflux disease without esophagitis: Secondary | ICD-10-CM | POA: Diagnosis present

## 2023-04-26 DIAGNOSIS — E876 Hypokalemia: Secondary | ICD-10-CM | POA: Diagnosis present

## 2023-04-26 DIAGNOSIS — Z9981 Dependence on supplemental oxygen: Secondary | ICD-10-CM

## 2023-04-26 DIAGNOSIS — Z79899 Other long term (current) drug therapy: Secondary | ICD-10-CM | POA: Diagnosis not present

## 2023-04-26 DIAGNOSIS — F32A Depression, unspecified: Secondary | ICD-10-CM | POA: Diagnosis present

## 2023-04-26 DIAGNOSIS — G894 Chronic pain syndrome: Secondary | ICD-10-CM | POA: Diagnosis present

## 2023-04-26 DIAGNOSIS — L03116 Cellulitis of left lower limb: Secondary | ICD-10-CM | POA: Diagnosis not present

## 2023-04-26 DIAGNOSIS — E785 Hyperlipidemia, unspecified: Secondary | ICD-10-CM | POA: Diagnosis present

## 2023-04-26 DIAGNOSIS — G2581 Restless legs syndrome: Secondary | ICD-10-CM | POA: Diagnosis not present

## 2023-04-26 DIAGNOSIS — E1151 Type 2 diabetes mellitus with diabetic peripheral angiopathy without gangrene: Secondary | ICD-10-CM | POA: Diagnosis present

## 2023-04-26 DIAGNOSIS — J449 Chronic obstructive pulmonary disease, unspecified: Secondary | ICD-10-CM | POA: Diagnosis present

## 2023-04-26 DIAGNOSIS — I130 Hypertensive heart and chronic kidney disease with heart failure and stage 1 through stage 4 chronic kidney disease, or unspecified chronic kidney disease: Secondary | ICD-10-CM | POA: Diagnosis not present

## 2023-04-26 DIAGNOSIS — F419 Anxiety disorder, unspecified: Secondary | ICD-10-CM | POA: Diagnosis present

## 2023-04-26 DIAGNOSIS — Z820 Family history of epilepsy and other diseases of the nervous system: Secondary | ICD-10-CM

## 2023-04-26 DIAGNOSIS — L089 Local infection of the skin and subcutaneous tissue, unspecified: Secondary | ICD-10-CM

## 2023-04-26 DIAGNOSIS — Z8249 Family history of ischemic heart disease and other diseases of the circulatory system: Secondary | ICD-10-CM

## 2023-04-26 DIAGNOSIS — Z113 Encounter for screening for infections with a predominantly sexual mode of transmission: Secondary | ICD-10-CM

## 2023-04-26 DIAGNOSIS — F17210 Nicotine dependence, cigarettes, uncomplicated: Secondary | ICD-10-CM | POA: Diagnosis present

## 2023-04-26 DIAGNOSIS — E1122 Type 2 diabetes mellitus with diabetic chronic kidney disease: Secondary | ICD-10-CM | POA: Diagnosis not present

## 2023-04-26 DIAGNOSIS — Z114 Encounter for screening for human immunodeficiency virus [HIV]: Secondary | ICD-10-CM

## 2023-04-26 LAB — GLUCOSE, CAPILLARY: Glucose-Capillary: 96 mg/dL (ref 70–99)

## 2023-04-26 LAB — BASIC METABOLIC PANEL
Anion gap: 13 (ref 5–15)
BUN: 12 mg/dL (ref 8–23)
CO2: 34 mmol/L — ABNORMAL HIGH (ref 22–32)
Calcium: 8.9 mg/dL (ref 8.9–10.3)
Chloride: 87 mmol/L — ABNORMAL LOW (ref 98–111)
Creatinine, Ser: 1.78 mg/dL — ABNORMAL HIGH (ref 0.44–1.00)
GFR, Estimated: 31 mL/min — ABNORMAL LOW (ref >60)
Glucose, Bld: 86 mg/dL (ref 70–99)
Potassium: 3.3 mmol/L — ABNORMAL LOW (ref 3.5–5.1)
Sodium: 134 mmol/L — ABNORMAL LOW (ref 135–145)

## 2023-04-26 LAB — CBC
HCT: 40.8 % (ref 36.0–46.0)
Hemoglobin: 13.1 g/dL (ref 12.0–15.0)
MCH: 32.1 pg (ref 26.0–34.0)
MCHC: 32.1 g/dL (ref 30.0–36.0)
MCV: 100 fL (ref 80.0–100.0)
Platelets: 300 10*3/uL (ref 150–400)
RBC: 4.08 MIL/uL (ref 3.87–5.11)
RDW: 14.3 % (ref 11.5–15.5)
WBC: 9.6 10*3/uL (ref 4.0–10.5)
nRBC: 0 % (ref 0.0–0.2)

## 2023-04-26 MED ORDER — INSULIN ASPART 100 UNIT/ML IJ SOLN: 0.0000 [IU] | Freq: Three times a day (TID) | INTRAMUSCULAR | Status: DC

## 2023-04-26 MED ORDER — ENOXAPARIN SODIUM 40 MG/0.4ML IJ SOSY
40.0000 mg | PREFILLED_SYRINGE | INTRAMUSCULAR | Status: DC
  Administered 2023-04-27 – 2023-04-29 (×3): 40 mg via SUBCUTANEOUS
  Filled 2023-04-26 (×2): qty 0.4

## 2023-04-26 MED ORDER — ACETAMINOPHEN 325 MG PO TABS
650.0000 mg | ORAL_TABLET | Freq: Four times a day (QID) | ORAL | Status: DC | PRN
  Administered 2023-04-27 – 2023-04-29 (×2): 650 mg via ORAL
  Filled 2023-04-26 (×2): qty 2

## 2023-04-26 MED ORDER — VANCOMYCIN HCL 1750 MG/350ML IV SOLN
1750.0000 mg | Freq: Once | INTRAVENOUS | Status: AC
  Administered 2023-04-26: 1750 mg via INTRAVENOUS
  Filled 2023-04-26: qty 350

## 2023-04-26 MED ORDER — MUPIROCIN 2 % EX OINT
1.0000 | TOPICAL_OINTMENT | Freq: Once | CUTANEOUS | Status: AC
  Administered 2023-04-26: 1 via TOPICAL
  Filled 2023-04-26: qty 22

## 2023-04-26 MED ORDER — SODIUM CHLORIDE 0.9 % IV SOLN
2.0000 g | INTRAVENOUS | Status: DC
  Administered 2023-04-27 – 2023-04-29 (×3): 2 g via INTRAVENOUS
  Filled 2023-04-26 (×3): qty 20

## 2023-04-26 MED ORDER — BUSPIRONE HCL 5 MG PO TABS
30.0000 mg | ORAL_TABLET | Freq: Two times a day (BID) | ORAL | Status: DC
  Administered 2023-04-27 – 2023-04-29 (×5): 30 mg via ORAL
  Filled 2023-04-26 (×5): qty 6

## 2023-04-26 MED ORDER — FENOFIBRATE 160 MG PO TABS
160.0000 mg | ORAL_TABLET | Freq: Every day | ORAL | Status: DC
  Administered 2023-04-27 – 2023-04-29 (×3): 160 mg via ORAL
  Filled 2023-04-26 (×3): qty 1

## 2023-04-26 MED ORDER — ONDANSETRON HCL 4 MG PO TABS: 4.0000 mg | ORAL_TABLET | Freq: Four times a day (QID) | ORAL | Status: DC | PRN

## 2023-04-26 MED ORDER — LOSARTAN POTASSIUM 50 MG PO TABS
100.0000 mg | ORAL_TABLET | Freq: Every day | ORAL | Status: DC
  Administered 2023-04-27 – 2023-04-29 (×3): 100 mg via ORAL
  Filled 2023-04-26 (×3): qty 2

## 2023-04-26 MED ORDER — ESCITALOPRAM OXALATE 10 MG PO TABS
20.0000 mg | ORAL_TABLET | Freq: Every day | ORAL | Status: DC
  Administered 2023-04-27 – 2023-04-29 (×3): 20 mg via ORAL
  Filled 2023-04-26 (×3): qty 2

## 2023-04-26 MED ORDER — FLUTICASONE FUROATE-VILANTEROL 100-25 MCG/ACT IN AEPB
1.0000 | INHALATION_SPRAY | Freq: Every day | RESPIRATORY_TRACT | Status: DC
  Administered 2023-04-27 – 2023-04-29 (×3): 1 via RESPIRATORY_TRACT
  Filled 2023-04-26: qty 28

## 2023-04-26 MED ORDER — ALBUTEROL SULFATE (2.5 MG/3ML) 0.083% IN NEBU: 2.5000 mg | INHALATION_SOLUTION | RESPIRATORY_TRACT | Status: DC | PRN

## 2023-04-26 MED ORDER — TIZANIDINE HCL 2 MG PO TABS
4.0000 mg | ORAL_TABLET | Freq: Four times a day (QID) | ORAL | Status: DC | PRN
  Administered 2023-04-27 – 2023-04-28 (×3): 4 mg via ORAL
  Filled 2023-04-26 (×4): qty 2

## 2023-04-26 MED ORDER — MORPHINE SULFATE (PF) 2 MG/ML IV SOLN: 2.0000 mg | INTRAVENOUS | Status: DC | PRN

## 2023-04-26 MED ORDER — TRAMADOL HCL 50 MG PO TABS: 50.0000 mg | ORAL_TABLET | Freq: Every day | ORAL | Status: DC | PRN

## 2023-04-26 MED ORDER — TRAZODONE HCL 50 MG PO TABS
25.0000 mg | ORAL_TABLET | Freq: Every evening | ORAL | Status: DC | PRN
  Administered 2023-04-28: 50 mg via ORAL
  Filled 2023-04-26: qty 1

## 2023-04-26 MED ORDER — ARIPIPRAZOLE 10 MG PO TABS
10.0000 mg | ORAL_TABLET | Freq: Every day | ORAL | Status: DC
  Administered 2023-04-27: 10 mg via ORAL
  Filled 2023-04-26 (×2): qty 1

## 2023-04-26 MED ORDER — DILTIAZEM HCL ER COATED BEADS 180 MG PO CP24
180.0000 mg | ORAL_CAPSULE | Freq: Every day | ORAL | Status: DC
  Administered 2023-04-27 – 2023-04-29 (×3): 180 mg via ORAL
  Filled 2023-04-26 (×3): qty 1

## 2023-04-26 MED ORDER — HYDROCODONE-ACETAMINOPHEN 5-325 MG PO TABS
1.0000 | ORAL_TABLET | ORAL | Status: DC | PRN
  Administered 2023-04-27 – 2023-04-29 (×7): 1 via ORAL
  Filled 2023-04-26 (×7): qty 1

## 2023-04-26 MED ORDER — UMECLIDINIUM BROMIDE 62.5 MCG/ACT IN AEPB
1.0000 | INHALATION_SPRAY | Freq: Every day | RESPIRATORY_TRACT | Status: DC
  Administered 2023-04-27 – 2023-04-29 (×3): 1 via RESPIRATORY_TRACT
  Filled 2023-04-26: qty 7

## 2023-04-26 MED ORDER — ACETAMINOPHEN 325 MG RE SUPP: 650.0000 mg | Freq: Four times a day (QID) | RECTAL | Status: DC | PRN

## 2023-04-26 MED ORDER — FUROSEMIDE 40 MG PO TABS: 40.0000 mg | ORAL_TABLET | Freq: Every day | ORAL | Status: DC

## 2023-04-26 MED ORDER — ONDANSETRON HCL 4 MG/2ML IJ SOLN: 4.0000 mg | Freq: Four times a day (QID) | INTRAMUSCULAR | Status: DC | PRN

## 2023-04-26 MED ORDER — INSULIN ASPART 100 UNIT/ML IJ SOLN: 0.0000 [IU] | Freq: Every day | INTRAMUSCULAR | Status: DC

## 2023-04-26 MED ORDER — ALBUTEROL SULFATE HFA 108 (90 BASE) MCG/ACT IN AERS: 2.0000 | INHALATION_SPRAY | RESPIRATORY_TRACT | Status: DC | PRN

## 2023-04-26 MED ORDER — POTASSIUM CHLORIDE CRYS ER 20 MEQ PO TBCR
40.0000 meq | EXTENDED_RELEASE_TABLET | Freq: Once | ORAL | Status: AC
  Administered 2023-04-27: 40 meq via ORAL
  Filled 2023-04-26: qty 2

## 2023-04-26 MED ORDER — SODIUM CHLORIDE 0.9 % IV SOLN
2.0000 g | Freq: Two times a day (BID) | INTRAVENOUS | Status: DC
  Filled 2023-04-26: qty 12.5

## 2023-04-26 MED ORDER — SODIUM CHLORIDE 0.9 % IV SOLN
1.0000 g | Freq: Once | INTRAVENOUS | Status: AC
  Administered 2023-04-26: 1 g via INTRAVENOUS
  Filled 2023-04-26: qty 10

## 2023-04-26 NOTE — Assessment & Plan Note (Signed)
Infected laceration Continue Rocephin and vancomycin Skin culture Pain control Wound care

## 2023-04-26 NOTE — Assessment & Plan Note (Signed)
Continue tramadol, tizanidine

## 2023-04-26 NOTE — Assessment & Plan Note (Signed)
Clinically euvolemic Continue losartan, furosemide

## 2023-04-26 NOTE — Assessment & Plan Note (Signed)
Continue Lexapro, trazodone, buspirone
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
## 2023-04-26 NOTE — Assessment & Plan Note (Signed)
Sliding scale insulin coverage 
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
## 2023-04-26 NOTE — Consult Note (Signed)
PHARMACY -  BRIEF ANTIBIOTIC NOTE   Pharmacy has received consult(s) for vancomycin from an ED provider.  The patient's profile has been reviewed for ht/wt/allergies/indication/available labs.    One time order(s) placed for Vancomycin 1750 mg   Further antibiotics/pharmacy consults should be ordered by admitting physician if indicated.                       Thank you, Sharen Hones 04/26/2023  8:51 PM

## 2023-04-26 NOTE — Assessment & Plan Note (Signed)
Continue levothyroxine 

## 2023-04-26 NOTE — Progress Notes (Signed)
Pharmacy Antibiotic Note  [name] is a 67 y.o. female admitted on 04/26/2023 with cellulitis.  Pharmacy has been consulted for Vancomycin dosing for 7 days.  Plan: Pt given Vancomycin 1750 mg once. Vancomycin 1000 mg IV Q 24 hrs. Goal AUC 400-550. Expected AUC: 497.4 SCr used: 1.48  Pharmacy will continue to follow and will adjust abx dosing whenever warranted.  Temp (24hrs), Avg:98.2 F (36.8 C), Min:98 F (36.7 C), Max:98.3 F (36.8 C)   Recent Labs  Lab 04/26/23 1730  WBC 9.6  CREATININE 1.78*    Estimated Creatinine Clearance: 34.5 mL/min (A) (by C-G formula based on SCr of 1.78 mg/dL (H)).    Allergies  Allergen Reactions   Nsaids Other (See Comments)    Pt states it messes up her kidneys But takes aspirin & ibuprofen   Thiazide-Type Diuretics     Other reaction(s): Unknown    Antimicrobials this admission: 5/14 Ceftriaxone >>  5/14 Vancomycin >> x 7 days  Microbiology results: 5/15 Aerobic Cx: Pending  Thank you for allowing pharmacy to be a part of this patient's care.  Otelia Sergeant, PharmD, [facility] 04/26/2023 11:26 PM

## 2023-04-26 NOTE — Assessment & Plan Note (Signed)
Chronic respiratory failure with hypoxia Not acutely exacerbated and home O2 requirement at baseline Continue home inhalers.  DuoNebs as needed Continue supplemental oxygen

## 2023-04-26 NOTE — Assessment & Plan Note (Signed)
Oral repletion with KCl 40 mEq x 1

## 2023-04-26 NOTE — Assessment & Plan Note (Signed)
Renal function at baseline with creatinine 1.78

## 2023-04-27 DIAGNOSIS — L03116 Cellulitis of left lower limb: Secondary | ICD-10-CM

## 2023-04-27 LAB — CBC
HCT: 36.9 % (ref 36.0–46.0)
Hemoglobin: 12.2 g/dL (ref 12.0–15.0)
MCH: 32.4 pg (ref 26.0–34.0)
MCHC: 33.1 g/dL (ref 30.0–36.0)
MCV: 98.1 fL (ref 80.0–100.0)
Platelets: 244 10*3/uL (ref 150–400)
RBC: 3.76 MIL/uL — ABNORMAL LOW (ref 3.87–5.11)
RDW: 14.3 % (ref 11.5–15.5)
WBC: 7.6 10*3/uL (ref 4.0–10.5)
nRBC: 0 % (ref 0.0–0.2)

## 2023-04-27 LAB — PHOSPHORUS: Phosphorus: 3.7 mg/dL (ref 2.5–4.6)

## 2023-04-27 LAB — BASIC METABOLIC PANEL
Anion gap: 10 (ref 5–15)
BUN: 11 mg/dL (ref 8–23)
CO2: 34 mmol/L — ABNORMAL HIGH (ref 22–32)
Calcium: 8.7 mg/dL — ABNORMAL LOW (ref 8.9–10.3)
Chloride: 93 mmol/L — ABNORMAL LOW (ref 98–111)
Creatinine, Ser: 1.48 mg/dL — ABNORMAL HIGH (ref 0.44–1.00)
GFR, Estimated: 39 mL/min — ABNORMAL LOW (ref >60)
Glucose, Bld: 92 mg/dL (ref 70–99)
Potassium: 3.1 mmol/L — ABNORMAL LOW (ref 3.5–5.1)
Sodium: 137 mmol/L (ref 135–145)

## 2023-04-27 LAB — AEROBIC CULTURE W GRAM STAIN (SUPERFICIAL SPECIMEN)

## 2023-04-27 LAB — GLUCOSE, CAPILLARY
Glucose-Capillary: 100 mg/dL — ABNORMAL HIGH (ref 70–99)
Glucose-Capillary: 116 mg/dL — ABNORMAL HIGH (ref 70–99)
Glucose-Capillary: 111 mg/dL — ABNORMAL HIGH (ref 70–99)
Glucose-Capillary: 107 mg/dL — ABNORMAL HIGH (ref 70–99)

## 2023-04-27 LAB — MAGNESIUM: Magnesium: 1.9 mg/dL (ref 1.7–2.4)

## 2023-04-27 MED ORDER — VITAMIN C 500 MG PO TABS
500.0000 mg | ORAL_TABLET | Freq: Two times a day (BID) | ORAL | Status: DC
  Administered 2023-04-27 – 2023-04-29 (×5): 500 mg via ORAL
  Filled 2023-04-27 (×5): qty 1

## 2023-04-27 MED ORDER — POTASSIUM CHLORIDE CRYS ER 20 MEQ PO TBCR
40.0000 meq | EXTENDED_RELEASE_TABLET | Freq: Once | ORAL | Status: AC
  Administered 2023-04-27: 40 meq via ORAL
  Filled 2023-04-27: qty 2

## 2023-04-27 MED ORDER — VANCOMYCIN HCL IN DEXTROSE 1-5 GM/200ML-% IV SOLN
1000.0000 mg | INTRAVENOUS | Status: DC
  Administered 2023-04-27 – 2023-04-28 (×2): 1000 mg via INTRAVENOUS
  Filled 2023-04-27 (×2): qty 200

## 2023-04-27 NOTE — Plan of Care (Signed)
  Problem: Skin Integrity: Goal: Skin integrity will improve Outcome: Progressing   Problem: Pain Managment: Goal: General experience of comfort will improve Outcome: Progressing   Problem: Safety: Goal: Ability to remain free from injury will improve Outcome: Progressing   Problem: Skin Integrity: Goal: Risk for impaired skin integrity will decrease Outcome: Progressing
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
## 2023-04-28 ENCOUNTER — Encounter: Payer: 59 | Admitting: Internal Medicine

## 2023-04-28 DIAGNOSIS — L03116 Cellulitis of left lower limb: Secondary | ICD-10-CM | POA: Diagnosis not present

## 2023-04-28 LAB — MAGNESIUM: Magnesium: 2 mg/dL (ref 1.7–2.4)

## 2023-04-28 LAB — CBC
HCT: 36.5 % (ref 36.0–46.0)
Hemoglobin: 11.8 g/dL — ABNORMAL LOW (ref 12.0–15.0)
MCH: 32 pg (ref 26.0–34.0)
MCHC: 32.3 g/dL (ref 30.0–36.0)
MCV: 98.9 fL (ref 80.0–100.0)
Platelets: 224 10*3/uL (ref 150–400)
RBC: 3.69 MIL/uL — ABNORMAL LOW (ref 3.87–5.11)
RDW: 13.9 % (ref 11.5–15.5)
WBC: 5.6 10*3/uL (ref 4.0–10.5)
nRBC: 0 % (ref 0.0–0.2)

## 2023-04-28 LAB — BASIC METABOLIC PANEL
Anion gap: 9 (ref 5–15)
BUN: 14 mg/dL (ref 8–23)
CO2: 33 mmol/L — ABNORMAL HIGH (ref 22–32)
Calcium: 9 mg/dL (ref 8.9–10.3)
Chloride: 94 mmol/L — ABNORMAL LOW (ref 98–111)
Creatinine, Ser: 1.29 mg/dL — ABNORMAL HIGH (ref 0.44–1.00)
GFR, Estimated: 46 mL/min — ABNORMAL LOW (ref >60)
Glucose, Bld: 109 mg/dL — ABNORMAL HIGH (ref 70–99)
Potassium: 3.3 mmol/L — ABNORMAL LOW (ref 3.5–5.1)
Sodium: 136 mmol/L (ref 135–145)

## 2023-04-28 LAB — HIV ANTIBODY (ROUTINE TESTING W REFLEX): HIV Screen 4th Generation wRfx: NONREACTIVE

## 2023-04-28 LAB — PHOSPHORUS: Phosphorus: 2.8 mg/dL (ref 2.5–4.6)

## 2023-04-28 LAB — GLUCOSE, CAPILLARY
Glucose-Capillary: 113 mg/dL — ABNORMAL HIGH (ref 70–99)
Glucose-Capillary: 121 mg/dL — ABNORMAL HIGH (ref 70–99)
Glucose-Capillary: 102 mg/dL — ABNORMAL HIGH (ref 70–99)
Glucose-Capillary: 124 mg/dL — ABNORMAL HIGH (ref 70–99)

## 2023-04-28 LAB — RPR: RPR Ser Ql: NONREACTIVE

## 2023-04-28 MED ORDER — HYDRALAZINE HCL 20 MG/ML IJ SOLN
10.0000 mg | Freq: Four times a day (QID) | INTRAMUSCULAR | Status: DC | PRN
  Administered 2023-04-28: 10 mg via INTRAVENOUS
  Filled 2023-04-28: qty 1

## 2023-04-28 MED ORDER — POTASSIUM CHLORIDE 20 MEQ PO PACK
40.0000 meq | PACK | Freq: Once | ORAL | Status: AC
  Administered 2023-04-28: 40 meq via ORAL
  Filled 2023-04-28: qty 2

## 2023-04-28 MED ORDER — POTASSIUM CHLORIDE CRYS ER 20 MEQ PO TBCR: 40.0000 meq | EXTENDED_RELEASE_TABLET | Freq: Once | ORAL | Status: DC

## 2023-04-28 MED ORDER — LEVOTHYROXINE SODIUM 25 MCG PO TABS
25.0000 ug | ORAL_TABLET | Freq: Every day | ORAL | Status: DC
  Administered 2023-04-29: 25 ug via ORAL
  Filled 2023-04-28: qty 1

## 2023-04-28 MED ORDER — POTASSIUM CHLORIDE 20 MEQ PO PACK
20.0000 meq | PACK | Freq: Once | ORAL | Status: AC
  Administered 2023-04-28: 20 meq via ORAL
  Filled 2023-04-28: qty 1

## 2023-04-28 MED ORDER — NICOTINE 21 MG/24HR TD PT24
21.0000 mg | MEDICATED_PATCH | Freq: Every day | TRANSDERMAL | Status: DC
  Administered 2023-04-28 – 2023-04-29 (×2): 21 mg via TRANSDERMAL
  Filled 2023-04-28 (×2): qty 1

## 2023-04-28 MED ORDER — PANTOPRAZOLE SODIUM 40 MG PO TBEC
40.0000 mg | DELAYED_RELEASE_TABLET | Freq: Every morning | ORAL | Status: DC
  Administered 2023-04-28 – 2023-04-29 (×2): 40 mg via ORAL
  Filled 2023-04-28 (×2): qty 1
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
## 2023-04-29 DIAGNOSIS — L03116 Cellulitis of left lower limb: Secondary | ICD-10-CM | POA: Diagnosis not present

## 2023-04-29 LAB — CBC
HCT: 38.2 % (ref 36.0–46.0)
Hemoglobin: 12.2 g/dL (ref 12.0–15.0)
MCH: 31.9 pg (ref 26.0–34.0)
MCHC: 31.9 g/dL (ref 30.0–36.0)
MCV: 100 fL (ref 80.0–100.0)
Platelets: 230 10*3/uL (ref 150–400)
RBC: 3.82 MIL/uL — ABNORMAL LOW (ref 3.87–5.11)
RDW: 14.1 % (ref 11.5–15.5)
WBC: 6.2 10*3/uL (ref 4.0–10.5)
nRBC: 0 % (ref 0.0–0.2)

## 2023-04-29 LAB — AEROBIC CULTURE W GRAM STAIN (SUPERFICIAL SPECIMEN): Special Requests: NORMAL

## 2023-04-29 LAB — BASIC METABOLIC PANEL
Anion gap: 7 (ref 5–15)
BUN: 12 mg/dL (ref 8–23)
CO2: 33 mmol/L — ABNORMAL HIGH (ref 22–32)
Calcium: 9.4 mg/dL (ref 8.9–10.3)
Chloride: 98 mmol/L (ref 98–111)
Creatinine, Ser: 1.06 mg/dL — ABNORMAL HIGH (ref 0.44–1.00)
GFR, Estimated: 58 mL/min — ABNORMAL LOW (ref >60)
Glucose, Bld: 99 mg/dL (ref 70–99)
Potassium: 3.8 mmol/L (ref 3.5–5.1)
Sodium: 138 mmol/L (ref 135–145)

## 2023-04-29 LAB — GLUCOSE, CAPILLARY
Glucose-Capillary: 115 mg/dL — ABNORMAL HIGH (ref 70–99)
Glucose-Capillary: 119 mg/dL — ABNORMAL HIGH (ref 70–99)

## 2023-04-29 LAB — PHOSPHORUS: Phosphorus: 3.2 mg/dL (ref 2.5–4.6)

## 2023-04-29 LAB — CHLAMYDIA/NGC RT PCR (ARMC ONLY)
Chlamydia Tr: NOT DETECTED
N gonorrhoeae: NOT DETECTED

## 2023-04-29 LAB — MAGNESIUM: Magnesium: 2.2 mg/dL (ref 1.7–2.4)

## 2023-04-29 MED ORDER — CEPHALEXIN 500 MG PO CAPS
500.0000 mg | ORAL_CAPSULE | Freq: Four times a day (QID) | ORAL | Status: DC
  Administered 2023-04-29: 500 mg via ORAL
  Filled 2023-04-29: qty 1

## 2023-04-29 MED ORDER — ACETAMINOPHEN 325 MG PO TABS: 650.0000 mg | ORAL_TABLET | Freq: Four times a day (QID) | ORAL | 0 refills | Status: DC | PRN

## 2023-04-29 MED ORDER — CEPHALEXIN 500 MG PO CAPS: 500.0000 mg | ORAL_CAPSULE | Freq: Four times a day (QID) | ORAL | 0 refills | Status: DC

## 2023-04-29 NOTE — Care Management Important Message (Signed)
Important Message  Patient Details  Name: [name] MRN: [id number] Date of Birth: 1956-01-03   Medicare Important Message Given:  Yes     Olegario Messier A Myrka Sylva 04/29/2023, 2:03 PM
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
## 2023-04-29 NOTE — Progress Notes (Signed)
  Transition of Care [facility]) Screening Note   Patient Details  Name: [name] Date of Birth: 1956-10-13   Transition of Care [facility]) CM/SW Contact:    Garret Reddish, RN Phone Number: 04/29/2023, 1:44 PM    Transition of Care Department [facility]) has reviewed patient and no TOC needs have been identified at this time. We will continue to monitor patient advancement through interdisciplinary progression rounds. If new patient transition needs arise, please place a TOC consult.

## 2023-05-02 ENCOUNTER — Telehealth: Payer: Self-pay | Admitting: *Deleted

## 2023-05-02 NOTE — Consult Note (Signed)
[facility] Customer service manager [facility]) Accountable Care Organization (ACO) [facility] Liaison Note  05/02/2023  [name] 1956-05-18 [id number]  Location: [facility] RN Hospital Liaison screened the patient remotely at [facility].  Insurance: [employer]   [name] is a 67 y.o. female who is a Primary Care Patient of Margarita Mail, DO [facility]). The patient was screened for readmission hospitalization with noted medium risk score for unplanned readmission risk with 1 IP/1 ED in 6 months.  The patient was assessed for potential [facility]) Care Management service needs for post hospital transition for care coordination. Review of patient's electronic medical record reveals patient was admitted with Cellulitis of the left lower extremity.   Plan: [facility] Liaison will continue to follow progress and disposition to asess for post hospital community care coordination/management needs.  Referral request for community care coordination: anticipate [facility] Transitions of Care Team follow up.   [facility] Care Management/Population Health does not replace or interfere with any arrangements made by the Inpatient Transition of Care team.   For questions contact:   Elliot Cousin, RN, BSN [facility] Liaison [place]   [facility]  Population Health Office Hours MTWF  8:00 am-6:00 pm Off on Thursday [phone] mobile [phone] [Office toll free line][facility] Office Hours are M-F 8:30 - 5 pm 24 hour nurse advise line [phone] Concierge  [email or web address]

## 2023-05-02 NOTE — Transitions of Care (Post Inpatient/ED Visit) (Signed)
   05/02/2023  Name: [name] MRN: [id number] DOB: 10-16-1956  Today's TOC FU Call Status: Today's TOC FU Call Status:: Unsuccessul Call (1st Attempt) Unsuccessful Call (1st Attempt) Date: 05/02/23  Attempted to reach the patient regarding the most recent Inpatient/ED visit.  Follow Up Plan: Additional outreach attempts will be made to reach the patient to complete the Transitions of Care (Post Inpatient/ED visit) call.   Gean Maidens BSN RN [facility] Care Management [phone]

## 2023-05-03 ENCOUNTER — Telehealth: Payer: Self-pay | Admitting: *Deleted

## 2023-05-03 NOTE — Transitions of Care (Post Inpatient/ED Visit) (Signed)
05/03/2023  Name: [name] MRN: [id number] DOB: 10-13-1956  Today's TOC FU Call Status: Today's TOC FU Call Status:: Successful TOC FU Call Competed TOC FU Call Complete Date: 05/03/23  Transition Care Management Follow-up Telephone Call Date of Discharge: 04/29/23 Discharge Facility: [facility]) Type of Discharge: Inpatient Admission Primary Inpatient Discharge Diagnosis:: cellulitus of he lower leg How have you been since you were released from the hospital?: Better Any questions or concerns?: No  Items Reviewed: Did you receive and understand the discharge instructions provided?: Yes Medications obtained,verified, and reconciled?: Yes (Medications Reviewed) Any new allergies since your discharge?: No Dietary orders reviewed?: No Do you have support at home?: Yes Name of Support/Comfort Primary Source: Harvie Heck son  Medications Reviewed Today: Medications Reviewed Today     Reviewed by Luella Cook, RN (Case Manager) on 05/03/23 at 1607  Med List Status: <None>   Medication Order Taking? Sig Documenting Provider Last Dose Status Informant  acetaminophen (TYLENOL) 325 MG tablet [id number] Yes Take 2 tablets (650 mg total) by mouth every 6 (six) hours as needed for mild pain (or Fever >/= 101). Loyce Dys, MD Taking Active   acidophilus [facility]) CAPS capsule [id number] Yes Take 1 capsule by mouth daily. [provider] Taking Active Self  albuterol (VENTOLIN HFA) 108 (90 Base) MCG/ACT inhaler [id number] Yes Inhale 2 puffs into the lungs every 4 (four) hours as needed for wheezing or shortness of breath. Margarita Mail, DO Taking Active   budesonide-formoterol [facility]) 160-4.5 MCG/ACT inhaler [id number] Yes Inhale 2 puffs into the lungs 2 (two) times daily. [provider] Taking Active Self  busPIRone (BUSPAR) 30 MG tablet [id number] Yes Take 30 mg by mouth 2 (two) times daily. [provider] Taking Active    cephALEXin (KEFLEX) 500 MG capsule [id number] Yes Take 1 capsule (500 mg total) by mouth every 6 (six) hours. Loyce Dys, MD Taking Active   diltiazem (CARDIZEM CD) 180 MG 24 hr capsule [id number] Yes Take 180 mg by mouth daily. [provider] Taking Active   escitalopram (LEXAPRO) 20 MG tablet [id number] Yes Take 20 mg by mouth daily. [provider] Taking Active Self  famotidine (PEPCID) 40 MG tablet [id number] Yes Take 40 mg by mouth at bedtime. [provider] Taking Active Self  fenofibrate micronized (LOFIBRA) 134 MG capsule [id number] Yes Take 134 mg by mouth daily before breakfast.  [provider] Taking Active Self  gabapentin (NEURONTIN) 800 MG tablet [id number] Yes Take 800 mg by mouth 3 (three) times daily.  [provider] Taking Active Self  levothyroxine (SYNTHROID, LEVOTHROID) 25 MCG tablet [id number] Yes Take 25 mcg by mouth daily before breakfast.  [provider] Taking Active Self  losartan (COZAAR) 100 MG tablet [id number] Yes Take 100 mg by mouth daily.  [provider] Taking Active Self  OXYGEN [id number] Yes Inhale 3 L into the lungs at bedtime. [provider] Taking Active Self  pantoprazole (PROTONIX) 40 MG tablet [id number] Yes Take 40 mg by mouth every morning.  [provider] Taking Active Self           Med Note Cyndie Chime, [facility]   Tue Aug 09, 2017  9:31 PM)    pramipexole (MIRAPEX) 0.5 MG tablet [id number] No Take 0.5 mg by mouth at bedtime.  Patient not taking: Reported on 05/03/2023   [provider] Not Taking Active Self  tiZANidine (ZANAFLEX) 4 MG tablet [id number] Yes Take 1 tablet (4 mg total)  by mouth every 6 (six) hours as needed for muscle spasms. Margarita Mail, DO Taking Active   traMADol (ULTRAM) 50 MG tablet [id number]  Take 1 tablet (50 mg total) by mouth daily as needed for severe pain. Margarita Mail, DO  Active   traZODone (DESYREL) 50 MG tablet  [id number] Yes Take 0.5-1 tablets (25-50 mg total) by mouth at bedtime as needed for sleep. Margarita Mail, DO Taking Active   Med List Note Concepcion Elk, RN 07/20/18 1202): 07-20-18            Home Care and Equipment/Supplies: Were Home Health Services Ordered?: NA Any new equipment or medical supplies ordered?: NA  Functional Questionnaire: Do you need assistance with bathing/showering or dressing?: No Do you need assistance with meal preparation?: No Do you need assistance with eating?: No Do you have difficulty maintaining continence: No Do you need assistance with getting out of bed/getting out of a chair/moving?: No Do you have difficulty managing or taking your medications?: No  Follow up appointments reviewed: PCP Follow-up appointment confirmed?: Yes Date of PCP follow-up appointment?: 05/20/23 Follow-up Provider: Dr Caralee Ates [facility] Follow-up appointment confirmed?: Yes Date of Specialist follow-up appointment?: 05/23/23 Follow-Up Specialty Provider:: Dr McDiarmid Do you need transportation to your follow-up appointment?: No Do you understand care options if your condition(s) worsen?: Yes-patient verbalized understanding  SDOH Interventions Today    Flowsheet Row Most Recent Value  SDOH Interventions   Food Insecurity Interventions Intervention Not Indicated  Housing Interventions Intervention Not Indicated  Transportation Interventions Intervention Not Indicated      Interventions Today    Flowsheet Row Most Recent Value  General Interventions   General Interventions Discussed/Reviewed General Interventions Discussed, General Interventions Reviewed, Doctor Visits  Doctor Visits Discussed/Reviewed Doctor Visits Discussed, Doctor Visits Reviewed  Pharmacy Interventions   Pharmacy Dicussed/Reviewed Pharmacy Topics Discussed, Pharmacy Topics Reviewed      TOC Interventions Today    Flowsheet Row Most Recent Value  TOC Interventions    TOC Interventions Discussed/Reviewed TOC Interventions Discussed, TOC Interventions Reviewed       Gean Maidens BSN RN [facility] [phone]

## 2023-05-18 DIAGNOSIS — J449 Chronic obstructive pulmonary disease, unspecified: Secondary | ICD-10-CM | POA: Diagnosis not present

## 2023-05-19 NOTE — Progress Notes (Deleted)
Name: [name]   MRN: [id number]    DOB: 07/27/1956   Date:05/19/2023       Progress Note  Subjective  Chief Complaint  No chief complaint on file.   HPI  Patient presents for annual CPE.  Diet: *** Exercise: ***  Last Eye Exam: *** Last Dental Exam: ***  Flowsheet Row Telemedicine from 12/22/2022 in [facility]  AUDIT-C Score 1      Depression: Phq 9 is  {Desc; negative/positive:13464}    02/22/2023    2:17 PM 02/11/2023    2:00 PM 01/04/2023    1:45 PM 12/22/2022   10:51 AM 12/16/2022    8:01 AM  Depression screen PHQ 2/9  Decreased Interest 0 0 2 0 0  Down, Depressed, Hopeless 0 0 2 0 0  PHQ - 2 Score 0 0 4 0 0  Altered sleeping 0 0 3 0 0  Tired, decreased energy 0 0 3 0 0  Change in appetite 0 0 3 0 0  Feeling bad or failure about yourself  0 0 0 0 0  Trouble concentrating 0 0 0 0 0  Moving slowly or fidgety/restless 0 0 0 0 0  Suicidal thoughts 0 0 0 0 0  PHQ-9 Score 0 0 13 0 0  Difficult doing work/chores Not difficult at all Not difficult at all  Not difficult at all Not difficult at all   Hypertension: BP Readings from Last 3 Encounters:  04/29/23 139/67  03/25/23 116/70  03/13/23 (!) 126/94   Obesity: Wt Readings from Last 3 Encounters:  04/26/23 191 lb (86.6 kg)  03/25/23 193 lb 11.2 oz (87.9 kg)  03/12/23 201 lb 8 oz (91.4 kg)   BMI Readings from Last 3 Encounters:  04/26/23 30.83 kg/m  03/25/23 31.26 kg/m  03/12/23 32.52 kg/m     Vaccines:  Tdap: 2024 Shingrix: Discussed obtaining at the pharmacy Pneumonia: UTD Flu: UTD COVID-19: UTD   Hep C Screening: 2023 STD testing and prevention (HIV/chl/gon/syphilis):  Intimate partner violence: {Desc; negative/positive:13464} screen  Menstrual History/LMP/Abnormal Bleeding:  Discussed importance of follow up if any post-menopausal bleeding: {Response; yes/no/na:63}  Incontinence Symptoms: {Desc; negative/positive:13464} for symptoms   Breast cancer:  - Last  Mammogram: 9/23  Osteoporosis Prevention : Discussed high calcium and vitamin D supplementation, weight bearing exercises Bone density :already ordered, not yet scheduled   Cervical cancer screening:   Skin cancer: Discussed monitoring for atypical lesions  Colorectal cancer: colonoscopy 2023, repeated in 10 years Lung cancer:  Low Dose CT Chest recommended if Age 75-80 years, 20 pack-year currently smoking OR have quit w/in 15years. Patient does qualify for screen  previously ordered, not yet scheduled   ECG: 07/06/22  Advanced Care Planning: A voluntary discussion about advance care planning including the explanation and discussion of advance directives.  Discussed health care proxy and Living will, and the patient was able to identify a health care proxy as ***.  Patient {DOES_DOES WUJ:81191} have a living will and power of attorney of health care   Lipids: Lab Results  Component Value Date   CHOL 154 11/15/2022   CHOL  03/13/2010    98        ATP III CLASSIFICATION:  <200     mg/dL   Desirable  478-295  mg/dL   Borderline High  >=621    mg/dL   High          Lab Results  Component Value Date   HDL 58 11/15/2022  HDL 24 (L) 03/13/2010   Lab Results  Component Value Date   LDLCALC 78 11/15/2022   LDLCALC  03/13/2010    61        Total Cholesterol/HDL:CHD Risk Coronary Heart Disease Risk Table                     Men   Women  1/2 Average Risk   3.4   3.3  Average Risk       5.0   4.4  2 X Average Risk   9.6   7.1  3 X Average Risk  23.4   11.0        Use the calculated Patient Ratio above and the CHD Risk Table to determine the patient's CHD Risk.        ATP III CLASSIFICATION (LDL):  <100     mg/dL   Optimal  161-096  mg/dL   Near or Above                    Optimal  130-159  mg/dL   Borderline  045-409  mg/dL   High  >811     mg/dL   Very High   Lab Results  Component Value Date   TRIG 98 11/15/2022   TRIG 66 03/13/2010   Lab Results  Component Value  Date   CHOLHDL 2.7 11/15/2022   CHOLHDL 4.1 03/13/2010   No results found for: "LDLDIRECT"  Glucose: Glucose, Bld  Date Value Ref Range Status  04/29/2023 99 70 - 99 mg/dL Final    Comment:    Glucose reference range applies only to samples taken after fasting for at least 8 hours.  04/28/2023 109 (H) 70 - 99 mg/dL Final    Comment:    Glucose reference range applies only to samples taken after fasting for at least 8 hours.  04/27/2023 92 70 - 99 mg/dL Final    Comment:    Glucose reference range applies only to samples taken after fasting for at least 8 hours.   Glucose-Capillary  Date Value Ref Range Status  04/29/2023 119 (H) 70 - 99 mg/dL Final    Comment:    Glucose reference range applies only to samples taken after fasting for at least 8 hours.  04/29/2023 115 (H) 70 - 99 mg/dL Final    Comment:    Glucose reference range applies only to samples taken after fasting for at least 8 hours.  04/28/2023 124 (H) 70 - 99 mg/dL Final    Comment:    Glucose reference range applies only to samples taken after fasting for at least 8 hours.    Patient Active Problem List   Diagnosis Date Noted   Cellulitis of left lower extremity 04/26/2023   Infected laceration 04/26/2023   Stage 3b chronic kidney disease (HCC) 04/26/2023   Hyperkalemia 04/26/2023   Sepsis due to pneumonia (HCC) 07/06/2022   Severe sepsis (HCC) 07/06/2022   Type 2 diabetes mellitus with peripheral neuropathy (HCC) 07/06/2022   Hypothyroidism, unspecified 07/06/2022   Normocytic anemia 06/19/2022   Complete rectal prolapse 06/18/2022   Chronic respiratory failure with hypoxia (HCC) 06/18/2022   GERD (gastroesophageal reflux disease) 04/06/2022   Restless leg syndrome 04/06/2022   Seizures (HCC) 04/06/2022   Vomiting without nausea 04/06/2022   AKI (acute kidney injury) (HCC) 04/06/2022   Delayed gastric emptying 04/06/2022   Anxiety and depression 01/25/2021   Acute on chronic respiratory failure with  hypoxia (HCC) 11/17/2020  Protein calorie malnutrition (HCC) 11/17/2020   Hypothyroidism 11/17/2020   Leg pain 02/05/2020   Myofascial pain syndrome 08/23/2018   Lumbar spondylosis 08/23/2018   Leg swelling 07/31/2018   Tobacco abuse 07/31/2018   Fusion of lumbar spine (L3-S1) 07/20/2018   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   Lumbar degenerative disc disease 07/20/2018   Drug overdose 04/18/2014   Chronic pain syndrome 04/18/2014   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) 04/15/2014   Respiratory failure (HCC) 04/14/2014   UTI (urinary tract infection) 04/28/2013   Weakness generalized 04/28/2013   Chronic diastolic CHF (congestive heart failure) (HCC) 04/28/2013   Sinus tachycardia 01/11/2013   COPD with acute exacerbation (HCC) 01/10/2013   Diastolic CHF, acute on chronic (HCC) 01/10/2013   Osteoarthritis of left knee 10/27/2011   COPD (chronic obstructive pulmonary disease) (HCC) 03/29/2011   Hypertension 03/29/2011    Past Surgical History:  Procedure Laterality Date   CARPAL TUNNEL RELEASE     left   COLONOSCOPY WITH PROPOFOL N/A 06/17/2022   Procedure: COLONOSCOPY WITH PROPOFOL;  Surgeon: Regis Bill, MD;  Location: [facility];  Service: Endoscopy;  Laterality: N/A;   JOINT REPLACEMENT  08/26/2011   right total knee arthroplasty, left total knee   KNEE ARTHROPLASTY  10/28/2011   Procedure: COMPUTER ASSISTED TOTAL KNEE ARTHROPLASTY;  Surgeon: Carlisle Beers Rendall III;  Location: [facility];  Service: Orthopedics;  Laterality: Left;  preop femoral nerve block   KNEE SURGERY     right   laminectomy and diskectomy     L4-5 with fusion   PROCTOSCOPY N/A 06/18/2022   Procedure: RIGID PROCTOSCOPY;  Surgeon: Karie Soda, MD;  Location: [facility];  Service: General;  Laterality: N/A;    Family History  Problem Relation Age of Onset   Heart attack Father 67   Alzheimer's disease Mother    Breast cancer Neg Hx     Social History    Socioeconomic History   Marital status: Widowed    Spouse name: Not on file   Number of children: Not on file   Years of education: Not on file   Highest education level: Not on file  Occupational History   Not on file  Tobacco Use   Smoking status: Every Day    Packs/day: 2.00    Years: 50.00    Additional pack years: 0.00    Total pack years: 100.00    Types: Cigarettes   Smokeless tobacco: Never  Vaping Use   Vaping Use: Every day   Substances: Nicotine  Substance and Sexual Activity   Alcohol use: Yes    Comment: occasional   Drug use: No   Sexual activity: Yes    Birth control/protection: Post-menopausal  Other Topics Concern   Not on file  Social History Narrative   ** Merged History Encounter **       Social Determinants of Health   Financial Resource Strain: Not on file  Food Insecurity: No Food Insecurity (05/03/2023)   Hunger Vital Sign    Worried About Running Out of Food in the Last Year: Never true    Ran Out of Food in the Last Year: Never true  Transportation Needs: No Transportation Needs (05/03/2023)   PRAPARE - Administrator, Civil Service (Medical): No    Lack of Transportation (Non-Medical): No  Physical Activity: Not on file  Stress: Not on file  Social Connections: Not on file  Intimate Partner Violence: Not At Risk (04/27/2023)  Humiliation, Afraid, Rape, and Kick questionnaire    Fear of Current or Ex-Partner: No    Emotionally Abused: No    Physically Abused: No    Sexually Abused: No     Current Outpatient Medications:    acetaminophen (TYLENOL) 325 MG tablet, Take 2 tablets (650 mg total) by mouth every 6 (six) hours as needed for mild pain (or Fever >/= 101)., Disp: 20 tablet, Rfl: 0   acidophilus (RISAQUAD) CAPS capsule, Take 1 capsule by mouth daily., Disp: , Rfl:    albuterol (VENTOLIN HFA) 108 (90 Base) MCG/ACT inhaler, Inhale 2 puffs into the lungs every 4 (four) hours as needed for wheezing or shortness of  breath., Disp: 18 g, Rfl: 11   budesonide-formoterol (SYMBICORT) 160-4.5 MCG/ACT inhaler, Inhale 2 puffs into the lungs 2 (two) times daily., Disp: , Rfl:    busPIRone (BUSPAR) 30 MG tablet, Take 30 mg by mouth 2 (two) times daily., Disp: , Rfl:    cephALEXin (KEFLEX) 500 MG capsule, Take 1 capsule (500 mg total) by mouth every 6 (six) hours., Disp: 20 capsule, Rfl: 0   diltiazem (CARDIZEM CD) 180 MG 24 hr capsule, Take 180 mg by mouth daily., Disp: , Rfl:    escitalopram (LEXAPRO) 20 MG tablet, Take 20 mg by mouth daily., Disp: , Rfl:    famotidine (PEPCID) 40 MG tablet, Take 40 mg by mouth at bedtime., Disp: , Rfl:    fenofibrate micronized (LOFIBRA) 134 MG capsule, Take 134 mg by mouth daily before breakfast. , Disp: , Rfl:    gabapentin (NEURONTIN) 800 MG tablet, Take 800 mg by mouth 3 (three) times daily. , Disp: , Rfl:    levothyroxine (SYNTHROID, LEVOTHROID) 25 MCG tablet, Take 25 mcg by mouth daily before breakfast. , Disp: , Rfl:    losartan (COZAAR) 100 MG tablet, Take 100 mg by mouth daily. , Disp: , Rfl:    OXYGEN, Inhale 3 L into the lungs at bedtime., Disp: , Rfl:    pantoprazole (PROTONIX) 40 MG tablet, Take 40 mg by mouth every morning. , Disp: , Rfl:    pramipexole (MIRAPEX) 0.5 MG tablet, Take 0.5 mg by mouth at bedtime. (Patient not taking: Reported on 05/03/2023), Disp: , Rfl:    tiZANidine (ZANAFLEX) 4 MG tablet, Take 1 tablet (4 mg total) by mouth every 6 (six) hours as needed for muscle spasms., Disp: 30 tablet, Rfl: 0   traMADol (ULTRAM) 50 MG tablet, Take 1 tablet (50 mg total) by mouth daily as needed for severe pain., Disp: 30 tablet, Rfl: 0   traZODone (DESYREL) 50 MG tablet, Take 0.5-1 tablets (25-50 mg total) by mouth at bedtime as needed for sleep., Disp: 30 tablet, Rfl: 3 No current facility-administered medications for this visit.  Facility-Administered Medications Ordered in Other Visits:    chlorhexidine (HIBICLENS) 4 % liquid 4 application, 60 mL, Topical, Once,  Duffy, Lucienne Minks, PA-C   chlorhexidine (HIBICLENS) 4 % liquid 4 application, 60 mL, Topical, Once, Duffy, Lucienne Minks, PA-C  Allergies  Allergen Reactions   Nsaids Other (See Comments)    Pt states it messes up her kidneys But takes aspirin & ibuprofen   Thiazide-Type Diuretics     Other reaction(s): Unknown     ROS  ***  Objective  There were no vitals filed for this visit.  There is no height or weight on file to calculate BMI.  Physical Exam ***  Recent Results (from the past 2160 hour(s))  POCT urinalysis dipstick     Status:  Abnormal   Collection Time: 02/22/23  2:25 PM  Result Value Ref Range   Color, UA yellow    Clarity, UA cloudy    Glucose, UA Negative Negative   Bilirubin, UA neg    Ketones, UA neg    Spec Grav, UA 1.020 1.010 - 1.025   Blood, UA neg    pH, UA 6.0 5.0 - 8.0   Protein, UA Negative Negative   Urobilinogen, UA 0.2 0.2 or 1.0 E.U./dL   Nitrite, UA positive    Leukocytes, UA Large (3+) (A) Negative   Appearance cloudy    Odor foul   Urine Culture     Status: Abnormal   Collection Time: 02/22/23  2:43 PM   Specimen: Urine  Result Value Ref Range   MICRO NUMBER: [id number]    SPECIMEN QUALITY: Adequate    Sample Source URINE    STATUS: FINAL    ISOLATE 1: Klebsiella pneumoniae (A)     Comment: Greater than 100,000 CFU/mL of Klebsiella pneumoniae      Susceptibility   Klebsiella pneumoniae - URINE CULTURE, REFLEX    AMOX/CLAVULANIC <=2 Sensitive     AMPICILLIN 16 Resistant     AMPICILLIN/SULBACTAM <=2 Sensitive     CEFAZOLIN* <=4 Not Reportable      * For infections other than uncomplicated UTI caused by E. coli, K. pneumoniae or P. mirabilis: Cefazolin is resistant if MIC > or = 8 mcg/mL. (Distinguishing susceptible versus intermediate for isolates with MIC < or = 4 mcg/mL requires additional testing.) For uncomplicated UTI caused by E. coli, K. pneumoniae or P. mirabilis: Cefazolin is susceptible if MIC <32 mcg/mL and predicts susceptible  to the oral agents cefaclor, cefdinir, cefpodoxime, cefprozil, cefuroxime, cephalexin and loracarbef.     CEFTAZIDIME <=1 Sensitive     CEFEPIME <=1 Sensitive     CEFTRIAXONE <=1 Sensitive     CIPROFLOXACIN <=0.25 Sensitive     LEVOFLOXACIN <=0.12 Sensitive     GENTAMICIN <=1 Sensitive     IMIPENEM <=0.25 Sensitive     NITROFURANTOIN <=16 Sensitive     PIP/TAZO <=4 Sensitive     TOBRAMYCIN <=1 Sensitive     TRIMETH/SULFA* <=20 Sensitive      * For infections other than uncomplicated UTI caused by E. coli, K. pneumoniae or P. mirabilis: Cefazolin is resistant if MIC > or = 8 mcg/mL. (Distinguishing susceptible versus intermediate for isolates with MIC < or = 4 mcg/mL requires additional testing.) For uncomplicated UTI caused by E. coli, K. pneumoniae or P. mirabilis: Cefazolin is susceptible if MIC <32 mcg/mL and predicts susceptible to the oral agents cefaclor, cefdinir, cefpodoxime, cefprozil, cefuroxime, cephalexin and loracarbef. Legend: S = Susceptible  I = Intermediate R = Resistant  NS = Not susceptible * = Not tested  NR = Not reported **NN = See antimicrobic comments   TSH     Status: None   Collection Time: 02/22/23  2:47 PM  Result Value Ref Range   TSH 2.01 0.40 - 4.50 mIU/L  Fe+TIBC+Fer     Status: None   Collection Time: 02/22/23  2:47 PM  Result Value Ref Range   Iron 60 45 - 160 mcg/dL   TIBC 540 981 - 191 mcg/dL (calc)   %SAT 16 16 - 45 % (calc)   Ferritin 84 16 - 288 ng/mL  Vitamin D (25 hydroxy)     Status: None   Collection Time: 02/22/23  2:47 PM  Result Value Ref Range   Vit D, 25-Hydroxy  58 30 - 100 ng/mL    Comment: Vitamin D Status         25-OH Vitamin D: . Deficiency:                    <20 ng/mL Insufficiency:             20 - 29 ng/mL Optimal:                 > or = 30 ng/mL . For 25-OH Vitamin D testing on patients on  D2-supplementation and patients for whom quantitation  of D2 and D3 fractions is required, the  [facility](TM) 25-OH VIT D, (D2,D3), LC/MS/MS is recommended: order  code 29562 (patients >43yrs). . See Note 1 . Note 1 . For additional information, please refer to  http://education.[facility].com/faq/FAQ199  (This link is being provided for informational/ educational purposes only.)   Vitamin B1     Status: None   Collection Time: 02/22/23  2:47 PM  Result Value Ref Range   Vitamin B1 (Thiamine) 16 8 - 30 nmol/L    Comment: (Note) Vitamin supplementation within 24 hours prior to blood  draw may affect the accuracy of the results. . This test was developed and its analytical performance  characteristics have been determined by [employer]. It has not been cleared or approved by FDA.  This assay has been validated pursuant to the CLIA  regulations and is used for clinical purposes. . [facility] [address] [facility],[address] [place] [address] [phone] Doris Cheadle. [place], MD   HgB A1c     Status: None   Collection Time: 02/22/23  2:47 PM  Result Value Ref Range   Hgb A1c MFr Bld 5.6 <5.7 % of total Hgb    Comment: For the purpose of screening for the presence of diabetes: . <5.7%       Consistent with the absence of diabetes 5.7-6.4%    Consistent with increased risk for diabetes             (prediabetes) > or =6.5%  Consistent with diabetes . This assay result is consistent with a decreased risk of diabetes. . Currently, no consensus exists regarding use of hemoglobin A1c for diagnosis of diabetes in children. . According to American Diabetes Association (ADA) guidelines, hemoglobin A1c <7.0% represents optimal control in non-pregnant diabetic patients. Different metrics may apply to specific patient populations.  Standards of Medical Care in Diabetes(ADA). .    Mean Plasma Glucose 114 mg/dL   eAG (mmol/L) 6.3 mmol/L    Comment: . This test was performed on the Roche cobas c503 platform. Effective 09/20/22, a change  in test platforms from the Abbott Architect to the Roche cobas c503 may have shifted HbA1c results compared to historical results. Based on laboratory validation testing conducted at [facility], the Roche platform relative to the Abbott platform had an average increase in HbA1c value of < or = 0.3%. This difference is within accepted  variability established by the [facility]. Note that not all individuals will have had a shift in their results and direct comparisons between historical and current results for testing conducted on different platforms is not recommended.   COMPLETE METABOLIC PANEL WITH GFR     Status: Abnormal   Collection Time: 02/22/23  2:47 PM  Result Value Ref Range   Glucose, Bld 111 (H) 65 - 99 mg/dL    Comment: .  Fasting reference interval . For someone without known diabetes, a glucose value between 100 and 125 mg/dL is consistent with prediabetes and should be confirmed with a follow-up test. .    BUN 15 7 - 25 mg/dL   Creat 4.03 (H) 4.74 - 1.05 mg/dL   eGFR 48 (L) > OR = 60 mL/min/1.6m2   BUN/Creatinine Ratio 12 6 - 22 (calc)   Sodium 131 (L) 135 - 146 mmol/L   Potassium 4.0 3.5 - 5.3 mmol/L   Chloride 95 (L) 98 - 110 mmol/L   CO2 23 20 - 32 mmol/L   Calcium 10.0 8.6 - 10.4 mg/dL   Total Protein 6.9 6.1 - 8.1 g/dL   Albumin 4.0 3.6 - 5.1 g/dL   Globulin 2.9 1.9 - 3.7 g/dL (calc)   AG Ratio 1.4 1.0 - 2.5 (calc)   Total Bilirubin 0.5 0.2 - 1.2 mg/dL   Alkaline phosphatase (APISO) 24 (L) 37 - 153 U/L   AST 29 10 - 35 U/L   ALT 18 6 - 29 U/L  DRUG MONITOR, PANEL 1, W/CONF, URINE     Status: None   Collection Time: 02/22/23  2:47 PM  Result Value Ref Range   Amphetamines NEGATIVE <500 ng/mL   Barbiturates NEGATIVE <300 ng/mL   Benzodiazepines NEGATIVE <100 ng/mL   Cocaine Metabolite NEGATIVE <150 ng/mL   Marijuana Metabolite NEGATIVE <20 ng/mL   Methadone Metabolite NEGATIVE <100 ng/mL   Opiates  NEGATIVE <100 ng/mL   Oxycodone NEGATIVE <100 ng/mL   Phencyclidine NEGATIVE <25 ng/mL   Creatinine 35.3 > or = 20.0 mg/dL   pH 7.3 4.5 - 9.0   Oxidant NEGATIVE <200 mcg/mL  DM TEMPLATE     Status: None   Collection Time: 02/22/23  2:47 PM  Result Value Ref Range   Notes and Comments      Comment: This drug testing is for medical treatment only. Analysis was performed as non-forensic testing and these results should be used only by healthcare providers to render diagnosis or treatment, or to monitor progress of medical conditions. . . Healthcare Providers needing Interpretation assistance,  please contact us at [phone] ([phone])  M-F, 8am to 10pm EST   Comprehensive metabolic panel     Status: Abnormal   Collection Time: 03/12/23 11:42 PM  Result Value Ref Range   Sodium 131 (L) 135 - 145 mmol/L   Potassium 4.7 3.5 - 5.1 mmol/L   Chloride 90 (L) 98 - 111 mmol/L   CO2 29 22 - 32 mmol/L   Glucose, Bld 114 (H) 70 - 99 mg/dL    Comment: Glucose reference range applies only to samples taken after fasting for at least 8 hours.   BUN 25 (H) 8 - 23 mg/dL   Creatinine, Ser 2.59 (H) 0.44 - 1.00 mg/dL   Calcium 9.6 8.9 - 56.3 mg/dL   Total Protein 6.9 6.5 - 8.1 g/dL   Albumin 3.8 3.5 - 5.0 g/dL   AST 33 15 - 41 U/L   ALT 27 0 - 44 U/L   Alkaline Phosphatase 20 (L) 38 - 126 U/L   Total Bilirubin 0.9 0.3 - 1.2 mg/dL   GFR, Estimated 29 (L) >60 mL/min    Comment: (NOTE) Calculated using the CKD-EPI Creatinine Equation (2021)    Anion gap 12 5 - 15    Comment: Performed at [facility], [address] [place], [place] [address]  CBC     Status: Abnormal   Collection Time: 03/12/23 11:42  PM  Result Value Ref Range   WBC 12.6 (H) 4.0 - 10.5 K/uL   RBC 4.51 3.87 - 5.11 MIL/uL   Hemoglobin 14.8 12.0 - 15.0 g/dL   HCT 40.9 81.1 - 91.4 %   MCV 99.8 80.0 - 100.0 fL   MCH 32.8 26.0 - 34.0 pg   MCHC 32.9 30.0 - 36.0 g/dL   RDW 78.2 95.6 - 21.3 %   Platelets 279  150 - 400 K/uL   nRBC 0.0 0.0 - 0.2 %    Comment: Performed at [facility], [address] [place], [place] [address]  Lactic acid, plasma     Status: None   Collection Time: 03/12/23 11:42 PM  Result Value Ref Range   Lactic Acid, Venous 0.7 0.5 - 1.9 mmol/L    Comment: Performed at [facility], [address], [place], [place] [address]  Protime-INR     Status: None   Collection Time: 03/12/23 11:42 PM  Result Value Ref Range   Prothrombin Time 13.5 11.4 - 15.2 seconds   INR 1.0 0.8 - 1.2    Comment: (NOTE) INR goal varies based on device and disease states. Performed at [facility], [address], [place], [place] [address]   I-Stat Chem 8, ED     Status: Abnormal   Collection Time: 03/12/23 11:51 PM  Result Value Ref Range   Sodium 130 (L) 135 - 145 mmol/L   Potassium 4.5 3.5 - 5.1 mmol/L   Chloride 91 (L) 98 - 111 mmol/L   BUN 30 (H) 8 - 23 mg/dL   Creatinine, Ser 4.13 (H) 0.44 - 1.00 mg/dL   Glucose, Bld 244 (H) 70 - 99 mg/dL    Comment: Glucose reference range applies only to samples taken after fasting for at least 8 hours.   Calcium, Ion 1.19 1.15 - 1.40 mmol/L   TCO2 32 22 - 32 mmol/L   Hemoglobin 16.0 (H) 12.0 - 15.0 g/dL   HCT 01.0 (H) 27.2 - 53.6 %  Basic metabolic panel     Status: Abnormal   Collection Time: 04/26/23  5:30 PM  Result Value Ref Range   Sodium 134 (L) 135 - 145 mmol/L   Potassium 3.3 (L) 3.5 - 5.1 mmol/L   Chloride 87 (L) 98 - 111 mmol/L   CO2 34 (H) 22 - 32 mmol/L   Glucose, Bld 86 70 - 99 mg/dL    Comment: Glucose reference range applies only to samples taken after fasting for at least 8 hours.   BUN 12 8 - 23 mg/dL   Creatinine, Ser 6.44 (H) 0.44 - 1.00 mg/dL   Calcium 8.9 8.9 - 03.4 mg/dL   GFR, Estimated 31 (L) >60 mL/min    Comment: (NOTE) Calculated using the CKD-EPI Creatinine Equation (2021)    Anion gap 13 5 - 15    Comment: Performed at [facility], [address] [place], [place] [address]   CBC     Status: None   Collection Time: 04/26/23  5:30 PM  Result Value Ref Range   WBC 9.6 4.0 - 10.5 K/uL   RBC 4.08 3.87 - 5.11 MIL/uL   Hemoglobin 13.1 12.0 - 15.0 g/dL   HCT 56.3 87.5 - 64.3 %   MCV 100.0 80.0 - 100.0 fL   MCH 32.1 26.0 - 34.0 pg   MCHC 32.1 30.0 - 36.0 g/dL   RDW 32.9 51.8 - 84.1 %   Platelets 300 150 -  400 K/uL   nRBC 0.0 0.0 - 0.2 %    Comment: Performed at [facility], [address] [place], [place] [address]  Glucose, capillary     Status: None   Collection Time: 04/26/23 11:47 PM  Result Value Ref Range   Glucose-Capillary 96 70 - 99 mg/dL    Comment: Glucose reference range applies only to samples taken after fasting for at least 8 hours.  Aerobic Culture w Gram Stain (superficial specimen)     Status: None   Collection Time: 04/27/23  3:00 AM   Specimen: Wound  Result Value Ref Range   Specimen Description      WOUND LL Performed at [facility], [address], [place], [place] [address]    Special Requests      Normal Performed at [facility], [address] [place], [place] [address]    Gram Stain      FEW WBC PRESENT, PREDOMINANTLY PMN RARE GRAM POSITIVE COCCI Performed at [facility], [address], [place], [place] [address]    Culture MODERATE STAPHYLOCOCCUS AUREUS    Report Status 04/29/2023 FINAL    Organism ID, Bacteria STAPHYLOCOCCUS AUREUS       Susceptibility   Staphylococcus aureus - MIC*    CIPROFLOXACIN <=0.5 SENSITIVE Sensitive     ERYTHROMYCIN <=0.25 SENSITIVE Sensitive     GENTAMICIN <=0.5 SENSITIVE Sensitive     OXACILLIN 0.5 SENSITIVE Sensitive     TETRACYCLINE <=1 SENSITIVE Sensitive     VANCOMYCIN 1 SENSITIVE Sensitive     TRIMETH/SULFA <=10 SENSITIVE Sensitive     CLINDAMYCIN <=0.25 SENSITIVE Sensitive     RIFAMPIN <=0.5 SENSITIVE Sensitive     Inducible Clindamycin NEGATIVE Sensitive     LINEZOLID 1 SENSITIVE Sensitive     * MODERATE STAPHYLOCOCCUS AUREUS  Basic  metabolic panel     Status: Abnormal   Collection Time: 04/27/23  5:22 AM  Result Value Ref Range   Sodium 137 135 - 145 mmol/L   Potassium 3.1 (L) 3.5 - 5.1 mmol/L   Chloride 93 (L) 98 - 111 mmol/L   CO2 34 (H) 22 - 32 mmol/L   Glucose, Bld 92 70 - 99 mg/dL    Comment: Glucose reference range applies only to samples taken after fasting for at least 8 hours.   BUN 11 8 - 23 mg/dL   Creatinine, Ser 2.95 (H) 0.44 - 1.00 mg/dL   Calcium 8.7 (L) 8.9 - 10.3 mg/dL   GFR, Estimated 39 (L) >60 mL/min    Comment: (NOTE) Calculated using the CKD-EPI Creatinine Equation (2021)    Anion gap 10 5 - 15    Comment: Performed at [facility], [address] [place], [place] [address]  CBC     Status: Abnormal   Collection Time: 04/27/23  5:22 AM  Result Value Ref Range   WBC 7.6 4.0 - 10.5 K/uL   RBC 3.76 (L) 3.87 - 5.11 MIL/uL   Hemoglobin 12.2 12.0 - 15.0 g/dL   HCT 86.5 78.4 - 69.6 %   MCV 98.1 80.0 - 100.0 fL   MCH 32.4 26.0 - 34.0 pg   MCHC 33.1 30.0 - 36.0 g/dL   RDW 29.5 28.4 - 13.2 %   Platelets 244 150 - 400 K/uL   nRBC 0.0 0.0 - 0.2 %    Comment: Performed at [facility], [address], [place], [place] [address]  Magnesium     Status: None  Collection Time: 04/27/23  5:22 AM  Result Value Ref Range   Magnesium 1.9 1.7 - 2.4 mg/dL    Comment: Performed at [facility], [address] [place], [place] [address]  Phosphorus     Status: None   Collection Time: 04/27/23  5:22 AM  Result Value Ref Range   Phosphorus 3.7 2.5 - 4.6 mg/dL    Comment: Performed at [facility], [address] [place], [place] [address]  Glucose, capillary     Status: Abnormal   Collection Time: 04/27/23  9:00 AM  Result Value Ref Range   Glucose-Capillary 100 (H) 70 - 99 mg/dL    Comment: Glucose reference range applies only to samples taken after fasting for at least 8 hours.  Glucose, capillary     Status: Abnormal   Collection Time: 04/27/23 12:13 PM   Result Value Ref Range   Glucose-Capillary 116 (H) 70 - 99 mg/dL    Comment: Glucose reference range applies only to samples taken after fasting for at least 8 hours.  HIV Antibody (routine testing w rflx)     Status: None   Collection Time: 04/27/23  2:34 PM  Result Value Ref Range   HIV Screen 4th Generation wRfx Non Reactive Non Reactive    Comment: (NOTE) HIV-1/HIV-2 antibodies and HIV-1 p24 antigen were NOT detected. There is no laboratory evidence of HIV infection. HIV Negative Performed At: [facility] [address] [place], [place] [id number] Jolene Schimke MD WG:[id number]   RPR     Status: None   Collection Time: 04/27/23  2:34 PM  Result Value Ref Range   RPR Ser Ql NON REACTIVE NON REACTIVE    Comment: Performed at [facility], [address] [place], [place] [address]  Glucose, capillary     Status: Abnormal   Collection Time: 04/27/23  5:11 PM  Result Value Ref Range   Glucose-Capillary 111 (H) 70 - 99 mg/dL    Comment: Glucose reference range applies only to samples taken after fasting for at least 8 hours.  Glucose, capillary     Status: Abnormal   Collection Time: 04/27/23  9:31 PM  Result Value Ref Range   Glucose-Capillary 107 (H) 70 - 99 mg/dL    Comment: Glucose reference range applies only to samples taken after fasting for at least 8 hours.  CBC     Status: Abnormal   Collection Time: 04/28/23  5:16 AM  Result Value Ref Range   WBC 5.6 4.0 - 10.5 K/uL   RBC 3.69 (L) 3.87 - 5.11 MIL/uL   Hemoglobin 11.8 (L) 12.0 - 15.0 g/dL   HCT 62.9 52.8 - 41.3 %   MCV 98.9 80.0 - 100.0 fL   MCH 32.0 26.0 - 34.0 pg   MCHC 32.3 30.0 - 36.0 g/dL   RDW 24.4 01.0 - 27.2 %   Platelets 224 150 - 400 K/uL   nRBC 0.0 0.0 - 0.2 %    Comment: Performed at [facility], [address] [place] [address], [place], [place] [address]  Basic metabolic panel     Status: Abnormal   Collection Time: 04/28/23  5:16 AM  Result Value Ref Range   Sodium 136 135 - 145 mmol/L    Potassium 3.3 (L) 3.5 - 5.1 mmol/L   Chloride 94 (L) 98 - 111 mmol/L   CO2 33 (H) 22 - 32 mmol/L   Glucose, Bld 109 (H) 70 - 99 mg/dL    Comment: Glucose reference range applies  only to samples taken after fasting for at least 8 hours.   BUN 14 8 - 23 mg/dL   Creatinine, Ser 4.09 (H) 0.44 - 1.00 mg/dL   Calcium 9.0 8.9 - 81.1 mg/dL   GFR, Estimated 46 (L) >60 mL/min    Comment: (NOTE) Calculated using the CKD-EPI Creatinine Equation (2021)    Anion gap 9 5 - 15    Comment: Performed at [facility], [address], [place], [place] [address]  Magnesium     Status: None   Collection Time: 04/28/23  5:16 AM  Result Value Ref Range   Magnesium 2.0 1.7 - 2.4 mg/dL    Comment: Performed at [facility], [address] [place], [place] [address]  Phosphorus     Status: None   Collection Time: 04/28/23  5:16 AM  Result Value Ref Range   Phosphorus 2.8 2.5 - 4.6 mg/dL    Comment: Performed at [facility], [address] [place], [place] [address]  Glucose, capillary     Status: Abnormal   Collection Time: 04/28/23  8:20 AM  Result Value Ref Range   Glucose-Capillary 113 (H) 70 - 99 mg/dL    Comment: Glucose reference range applies only to samples taken after fasting for at least 8 hours.  Glucose, capillary     Status: Abnormal   Collection Time: 04/28/23 11:52 AM  Result Value Ref Range   Glucose-Capillary 121 (H) 70 - 99 mg/dL    Comment: Glucose reference range applies only to samples taken after fasting for at least 8 hours.  Glucose, capillary     Status: Abnormal   Collection Time: 04/28/23  5:25 PM  Result Value Ref Range   Glucose-Capillary 102 (H) 70 - 99 mg/dL    Comment: Glucose reference range applies only to samples taken after fasting for at least 8 hours.  Glucose, capillary     Status: Abnormal   Collection Time: 04/28/23  9:47 PM  Result Value Ref Range   Glucose-Capillary 124 (H) 70 - 99 mg/dL    Comment: Glucose reference range  applies only to samples taken after fasting for at least 8 hours.  CBC     Status: Abnormal   Collection Time: 04/29/23  4:38 AM  Result Value Ref Range   WBC 6.2 4.0 - 10.5 K/uL   RBC 3.82 (L) 3.87 - 5.11 MIL/uL   Hemoglobin 12.2 12.0 - 15.0 g/dL   HCT 57.8 46.9 - 62.9 %   MCV 100.0 80.0 - 100.0 fL   MCH 31.9 26.0 - 34.0 pg   MCHC 31.9 30.0 - 36.0 g/dL   RDW 52.8 41.3 - 24.4 %   Platelets 230 150 - 400 K/uL   nRBC 0.0 0.0 - 0.2 %    Comment: Performed at [facility], [address] [place], [place] [address]  Basic metabolic panel     Status: Abnormal   Collection Time: 04/29/23  4:38 AM  Result Value Ref Range   Sodium 138 135 - 145 mmol/L   Potassium 3.8 3.5 - 5.1 mmol/L   Chloride 98 98 - 111 mmol/L   CO2 33 (H) 22 - 32 mmol/L   Glucose, Bld 99 70 - 99 mg/dL    Comment: Glucose reference range applies only to samples taken after fasting for at least 8 hours.   BUN 12 8 - 23 mg/dL   Creatinine, Ser 2.53 (H) 0.44 - 1.00 mg/dL   Calcium 9.4 8.9 - 66.4 mg/dL  GFR, Estimated 58 (L) >60 mL/min    Comment: (NOTE) Calculated using the CKD-EPI Creatinine Equation (2021)    Anion gap 7 5 - 15    Comment: Performed at [facility], [address] [place], [place] [address]  Magnesium     Status: None   Collection Time: 04/29/23  4:38 AM  Result Value Ref Range   Magnesium 2.2 1.7 - 2.4 mg/dL    Comment: Performed at [facility], [address] [place], [place] [address]  Phosphorus     Status: None   Collection Time: 04/29/23  4:38 AM  Result Value Ref Range   Phosphorus 3.2 2.5 - 4.6 mg/dL    Comment: Performed at [facility], [address] [place], [place] [address]  Glucose, capillary     Status: Abnormal   Collection Time: 04/29/23  7:40 AM  Result Value Ref Range   Glucose-Capillary 115 (H) 70 - 99 mg/dL    Comment: Glucose reference range applies only to samples taken after fasting for at least 8 hours.  Chlamydia/NGC rt  PCR ([facility] only)     Status: None   Collection Time: 04/29/23 10:30 AM   Specimen: Urine  Result Value Ref Range   Specimen source GC/Chlam URINE, RANDOM    Chlamydia Tr NOT DETECTED NOT DETECTED   N gonorrhoeae NOT DETECTED NOT DETECTED    Comment: (NOTE) This CT/NG assay has not been evaluated in patients with a history of  hysterectomy. Performed at [facility], [address] [place], [place] [address]   Glucose, capillary     Status: Abnormal   Collection Time: 04/29/23 11:39 AM  Result Value Ref Range   Glucose-Capillary 119 (H) 70 - 99 mg/dL    Comment: Glucose reference range applies only to samples taken after fasting for at least 8 hours.     Fall Risk:    03/25/2023    2:09 PM 02/22/2023    2:16 PM 02/11/2023    1:57 PM 01/04/2023    1:45 PM 12/22/2022   10:50 AM  Fall Risk   Falls in the past year? 0 0 0 1 0  Number falls in past yr: 0 0 0 0 0  Injury with Fall? 0 0 0 1 0  Risk for fall due to :    Impaired balance/gait Impaired balance/gait  Follow up    Falls prevention discussed Falls prevention discussed;Education provided;Falls evaluation completed   ***  Functional Status Survey:   ***  Assessment & Plan  There are no diagnoses linked to this encounter.  -USPSTF grade A and B recommendations reviewed with patient; age-appropriate recommendations, preventive care, screening tests, etc discussed and encouraged; healthy living encouraged; see AVS for patient education given to patient -Discussed importance of 150 minutes of physical activity weekly, eat two servings of fish weekly, eat one serving of tree nuts ( cashews, pistachios, pecans, almonds.Marland Kitchen) every other day, eat 6 servings of fruit/vegetables daily and drink plenty of water and avoid sweet beverages.   -Reviewed Health Maintenance: {yes GN:562130}

## 2023-05-20 ENCOUNTER — Encounter: Payer: 59 | Admitting: Internal Medicine

## 2023-05-23 ENCOUNTER — Ambulatory Visit: Payer: 59 | Admitting: Urology

## 2023-05-24 ENCOUNTER — Ambulatory Visit (INDEPENDENT_AMBULATORY_CARE_PROVIDER_SITE_OTHER): Payer: 59 | Admitting: Physician Assistant

## 2023-05-24 ENCOUNTER — Encounter: Payer: Self-pay | Admitting: Physician Assistant

## 2023-05-24 ENCOUNTER — Ambulatory Visit: Payer: Self-pay | Admitting: *Deleted

## 2023-05-24 VITALS — BP 158/78 | HR 89 | Temp 97.9°F | Resp 20 | Ht 66.0 in | Wt 185.3 lb

## 2023-05-24 DIAGNOSIS — J069 Acute upper respiratory infection, unspecified: Secondary | ICD-10-CM

## 2023-05-24 DIAGNOSIS — M549 Dorsalgia, unspecified: Secondary | ICD-10-CM | POA: Insufficient documentation

## 2023-05-24 DIAGNOSIS — R110 Nausea: Secondary | ICD-10-CM | POA: Diagnosis not present

## 2023-05-24 DIAGNOSIS — I10 Essential (primary) hypertension: Secondary | ICD-10-CM

## 2023-05-24 MED ORDER — HYDRALAZINE HCL 10 MG PO TABS
10.0000 mg | ORAL_TABLET | Freq: Three times a day (TID) | ORAL | 0 refills | Status: DC | PRN
Start: 2023-05-24 — End: 2024-07-28

## 2023-05-24 MED ORDER — ONDANSETRON HCL 4 MG PO TABS: 4.0000 mg | ORAL_TABLET | Freq: Three times a day (TID) | ORAL | 0 refills | Status: DC | PRN

## 2023-05-24 NOTE — Progress Notes (Unsigned)
Acute Office Visit   Patient: [name]   DOB: 1956/11/17   67 y.o. Female  MRN: [id number] Visit Date: 05/24/2023  Today's healthcare provider: Oswaldo Conroy Shawnita Krizek, PA-C  Introduced myself to the patient as a Secondary school teacher and provided education on APPs in clinical practice.    Chief Complaint  Patient presents with   Headache    X1 week   Nausea    X4 days   Subjective    HPI HPI     Headache    Additional comments: X1 week        Nausea    Additional comments: X4 days      Last edited by Benay Pike, CMA on 05/24/2023  2:32 PM.        She reports ongoing headache along her forehead since last thurs She reports headache is currently 7/10 in severity  She states she has had some nasal congestion, rhinorrhea, nausea, productive cough with white sputum She reports her urine is dark yellow and has an odor but denies dysuria  She states she has been using OTC sinus medications for congestion and headache She reports she has had vision changes for the past 2 weeks - states she needs to wear glasses to read which is not her normal  She denies recent sick contacts   She does not check BP at home - unsure if today's reading is recurrent  She reports she has been taking her BP meds as directed   Patient requesting Tramadol for pain - asked if I would contact PCP for her to send in script after I declined siting opioid agreement with PCP.   Medications: Outpatient Medications Prior to Visit  Medication Sig   acetaminophen (TYLENOL) 325 MG tablet Take 2 tablets (650 mg total) by mouth every 6 (six) hours as needed for mild pain (or Fever >/= 101).   acidophilus (RISAQUAD) CAPS capsule Take 1 capsule by mouth daily.   albuterol (VENTOLIN HFA) 108 (90 Base) MCG/ACT inhaler Inhale 2 puffs into the lungs every 4 (four) hours as needed for wheezing or shortness of breath.   budesonide-formoterol (SYMBICORT) 160-4.5 MCG/ACT inhaler Inhale 2 puffs into the lungs 2 (two)  times daily.   busPIRone (BUSPAR) 30 MG tablet Take 30 mg by mouth 2 (two) times daily.   cephALEXin (KEFLEX) 500 MG capsule Take 1 capsule (500 mg total) by mouth every 6 (six) hours.   diltiazem (CARDIZEM CD) 180 MG 24 hr capsule Take 180 mg by mouth daily.   escitalopram (LEXAPRO) 20 MG tablet Take 20 mg by mouth daily.   famotidine (PEPCID) 40 MG tablet Take 40 mg by mouth at bedtime.   fenofibrate micronized (LOFIBRA) 134 MG capsule Take 134 mg by mouth daily before breakfast.    gabapentin (NEURONTIN) 800 MG tablet Take 800 mg by mouth 3 (three) times daily.    levETIRAcetam (KEPPRA) 500 MG tablet Take 500 mg by mouth 2 (two) times daily.   levothyroxine (SYNTHROID, LEVOTHROID) 25 MCG tablet Take 25 mcg by mouth daily before breakfast.    losartan (COZAAR) 100 MG tablet Take 100 mg by mouth daily.    OXYGEN Inhale 3 L into the lungs at bedtime.   pantoprazole (PROTONIX) 40 MG tablet Take 40 mg by mouth every morning.    pilocarpine (SALAGEN) 5 MG tablet    pramipexole (MIRAPEX) 0.5 MG tablet Take 0.5 mg by mouth at bedtime.   SPIRIVA HANDIHALER 18  MCG inhalation capsule 1 capsule daily.   tiZANidine (ZANAFLEX) 4 MG tablet Take 1 tablet (4 mg total) by mouth every 6 (six) hours as needed for muscle spasms.   traZODone (DESYREL) 50 MG tablet Take 0.5-1 tablets (25-50 mg total) by mouth at bedtime as needed for sleep.   [DISCONTINUED] traMADol (ULTRAM) 50 MG tablet Take 1 tablet (50 mg total) by mouth daily as needed for severe pain.   Facility-Administered Medications Prior to Visit  Medication Dose Route Frequency Provider   chlorhexidine (HIBICLENS) 4 % liquid 4 application  60 mL Topical Once Duffy, Lucienne Minks, PA-C   chlorhexidine (HIBICLENS) 4 % liquid 4 application  60 mL Topical Once Duffy, Lucienne Minks, PA-C    Review of Systems  Constitutional:  Negative for chills and fever.  HENT:  Positive for congestion, rhinorrhea, sinus pressure, sinus pain and sore throat. Negative for ear pain.    Respiratory:  Positive for cough. Negative for shortness of breath and wheezing.   Gastrointestinal:  Positive for nausea. Negative for diarrhea and vomiting.  Genitourinary:  Negative for dysuria.  Neurological:  Positive for headaches.    {Labs  Heme  Chem  Endocrine  Serology  Results Review (optional):23779}   Objective    BP (!) 190/110 (BP Location: Left Arm, Patient Position: Sitting, Cuff Size: Large)   Pulse 89   Temp 97.9 F (36.6 C) (Oral)   Resp 20   Ht 5\' 6"  (1.676 m) Comment: per patient  Wt 185 lb 4.8 oz (84.1 kg)   SpO2 92%   BMI 29.91 kg/m  {Show previous vital signs (optional):23777}  Physical Exam Vitals reviewed.  Constitutional:      Appearance: Normal appearance. She is well-developed.  HENT:     Head: Normocephalic and atraumatic.     Right Ear: Tympanic membrane, ear canal and external ear normal.     Left Ear: Tympanic membrane, ear canal and external ear normal.     Mouth/Throat:     Lips: Pink.     Mouth: Mucous membranes are moist.     Pharynx: Oropharynx is clear. Uvula midline. Posterior oropharyngeal erythema present. No oropharyngeal exudate.  Eyes:     General: Lids are normal. Gaze aligned appropriately.     Extraocular Movements: Extraocular movements intact.     Conjunctiva/sclera: Conjunctivae normal.     Pupils: Pupils are equal, round, and reactive to light.  Cardiovascular:     Rate and Rhythm: Normal rate and regular rhythm.     Pulses: Normal pulses.          Radial pulses are 2+ on the right side and 2+ on the left side.     Heart sounds: No murmur heard.    No friction rub. No gallop.  Pulmonary:     Effort: Pulmonary effort is normal.     Breath sounds: Decreased air movement present. Examination of the right-lower field reveals decreased breath sounds. Decreased breath sounds present. No wheezing, rhonchi or rales.  Lymphadenopathy:     Head:     Right side of head: No submental, submandibular or preauricular  adenopathy.     Left side of head: No submental, submandibular or preauricular adenopathy.     Cervical:     Right cervical: No superficial or posterior cervical adenopathy.    Left cervical: No superficial or posterior cervical adenopathy.     Upper Body:     Right upper body: No supraclavicular adenopathy.     Left upper body: No supraclavicular adenopathy.  Neurological:     Mental Status: She is alert.       No results found for any visits on 05/24/23.  Assessment & Plan      No follow-ups on file.     Problem List Items Addressed This Visit   None Visit Diagnoses     Upper respiratory tract infection, unspecified type    -  Primary   Nausea            No follow-ups on file.   I, Jahel Wavra E Bobi Daudelin, PA-C, have reviewed all documentation for this visit. The documentation on 05/24/23 for the exam, diagnosis, procedures, and orders are all accurate and complete.   Jacquelin Hawking, MHS, PA-C [facility]

## 2023-05-24 NOTE — Patient Instructions (Addendum)
   I recommend the following to help with your sinus congestion. These measures should also help improve your headaches once it starts to kick in   I also recommend adding an antihistamine to your daily regimen This includes medications like Claritin, Allegra, Zyrtec- the generics of these work very well and are usually less expensive I recommend using Flonase nasal spray - 2 puffs twice per day to help with your nasal congestion The antihistamines and Flonase can take a few weeks to provide significant relief from allergy symptoms but should start to provide some benefit soon. If you are not feeling better in about 5 days or start to feel worse, please let us know.   Your blood pressure was mildly elevated today.  Please make sure you are taking your medications as directed.  If possible please check your BP  at home using an electronic blood pressure cuff for the upper arm Record your blood pressure once per day and bring them back with you to your apt so we can make sure you are not developing high blood pressure.   If your blood pressure stays elevated and you continue to have blurry vision, headaches, or develop worsening headaches, chest pain, confusion, slurred speech, please go to the ED immediately.

## 2023-05-24 NOTE — Telephone Encounter (Signed)
Message from Allen Kell sent at 05/24/2023 10:51 AM EDT  Summary: nausea/headache   Pt is wanting to see if her PCP can call her in some nausea pills (pt has had nausea for 4 days) and has had a headache. Please advise.  [id number] [place], [place] - [address] AT [facility] [address] [facility] Phone: [phone] Fax: [phone]          Call History   Type Contact Phone/Fax User  05/24/2023 10:49 AM EDT Phone (Incoming) Rashi, Granier (Self) [phone] Judie Petit) Allred, Dondra Prader    Reason for Disposition  Nausea lasts > 1 week  Answer Assessment - Initial Assessment Questions 1. NAUSEA SEVERITY: "How bad is the nausea?" (e.g., mild, moderate, severe; dehydration, weight loss)   - MILD: loss of appetite without change in eating habits   - MODERATE: decreased oral intake without significant weight loss, dehydration, or malnutrition   - SEVERE: inadequate caloric or fluid intake, significant weight loss, symptoms of dehydration     I've had nausea for 4 days.   No vomit.    I have a headache also.    2. ONSET: "When did the nausea begin?"     4 days ago 3. VOMITING: "Any vomiting?" If Yes, ask: "How many times today?"     No 4. RECURRENT SYMPTOM: "Have you had nausea before?" If Yes, ask: "When was the last time?" "What happened that time?"     Not asked 5. CAUSE: "What do you think is causing the nausea?"     No    My nose is runny and I have a headache.    Congestion in the mornings.   Coughing up white mucus.    6. PREGNANCY: "Is there any chance you are pregnant?" (e.g., unprotected intercourse, missed birth control pill, broken condom)     N/A due to age  Protocols used: Nausea-A-AH

## 2023-05-24 NOTE — Telephone Encounter (Signed)
  Chief Complaint: Headache and nausea started a week ago.  Symptoms: Nausea for 4 days, poor appetite, headache, runny nose, coughing up white mucus in the mornings. Frequency: For the last week Pertinent Negatives: Patient denies sick exposures or vomiting Disposition: [] ED /[] Urgent Care (no appt availability in office) / [x] Appointment(In office/virtual)/ []  [place] [facility]/ [] Home Care/ [] Refused Recommended Disposition /[] [place] [facility]/ []  Follow-up with PCP Additional Notes: Appt made with Erin Mecum, PA-C for today at 2:40.

## 2023-05-25 ENCOUNTER — Ambulatory Visit: Payer: Self-pay

## 2023-05-25 NOTE — Assessment & Plan Note (Signed)
Chronic, historic condition, appears to be exacerbated at this time She reports a recurrent headache along forehead for the past week and some blurry vision  Serial BP checks in office demonstrate elevated BP She reports she is taking her Diltiazem 180 mg PO QD and Losartan 100 mg PO QD as directed Suspect elevations may be related to current pain and sinus congestion. Recommend she monitors BP at home and will add Hydralazine 10 mg PO TID PRN if BP is above 150/90  Reviewed ED and return precautions.  Recommend follow up in about 4 weeks for monitoring

## 2023-05-25 NOTE — Telephone Encounter (Signed)
  Chief Complaint: medication problem Symptoms: nausea Frequency: today Pertinent Negatives: denies vomiting Disposition: [] ED /[] Urgent Care (no appt availability in office) / [] Appointment(In office/virtual)/ []  [place] [facility]/ [] Home Care/ [] Refused Recommended Disposition /[] [place] [facility]/ [x]  Follow-up with PCP Additional Notes: pt was prescribed Zofran yesterday from [place], [place] and pt states helped yesterday with nausea but not helping today, she has taken at 0600, 1400 with no relief. Advised pt I would send message back and see if someone can FU with her but would prob be in the morning since office closes at 5. Pt prefers FU today if possible.   Summary: Rx no longer working   Nausea, feel as though I have to vomit but I can not. The  medication  prescribed yesterday  worked yesterday but is no longer working       Reason for Disposition . [1] Caller has URGENT medicine question about med that PCP or specialist prescribed AND [2] triager unable to answer question  Answer Assessment - Initial Assessment Questions 1. NAME of MEDICINE: "What medicine(s) are you calling about?"     Ondansetron  2. QUESTION: "What is your question?" (e.g., double dose of medicine, side effect)     Not working today for nausea  3. PRESCRIBER: "Who prescribed the medicine?" Reason: if prescribed by specialist, call should be referred to that group.     Dr. Caralee Ates  4. SYMPTOMS: "Do you have any symptoms?" If Yes, ask: "What symptoms are you having?"  "How bad are the symptoms (e.g., mild, moderate, severe)     Nausea  Protocols used: Medication Question Call-A-AH

## 2023-05-26 ENCOUNTER — Emergency Department: Payer: 59

## 2023-05-26 ENCOUNTER — Emergency Department
Admission: EM | Admit: 2023-05-26 | Discharge: 2023-05-26 | Disposition: A | Discharge status: Home / Self Care | Payer: 59 | Attending: Emergency Medicine | Admitting: Emergency Medicine

## 2023-05-26 ENCOUNTER — Telehealth: Payer: Self-pay | Admitting: Internal Medicine

## 2023-05-26 ENCOUNTER — Other Ambulatory Visit: Payer: Self-pay

## 2023-05-26 ENCOUNTER — Ambulatory Visit (INDEPENDENT_AMBULATORY_CARE_PROVIDER_SITE_OTHER): Payer: 59 | Admitting: Internal Medicine

## 2023-05-26 VITALS — BP 200/98 | HR 78 | Temp 98.1°F | Resp 22 | Ht 66.0 in | Wt 185.0 lb

## 2023-05-26 DIAGNOSIS — J449 Chronic obstructive pulmonary disease, unspecified: Secondary | ICD-10-CM | POA: Diagnosis not present

## 2023-05-26 DIAGNOSIS — I160 Hypertensive urgency: Secondary | ICD-10-CM | POA: Diagnosis not present

## 2023-05-26 DIAGNOSIS — I130 Hypertensive heart and chronic kidney disease with heart failure and stage 1 through stage 4 chronic kidney disease, or unspecified chronic kidney disease: Secondary | ICD-10-CM | POA: Diagnosis not present

## 2023-05-26 DIAGNOSIS — N189 Chronic kidney disease, unspecified: Secondary | ICD-10-CM | POA: Insufficient documentation

## 2023-05-26 DIAGNOSIS — I10 Essential (primary) hypertension: Secondary | ICD-10-CM | POA: Diagnosis not present

## 2023-05-26 DIAGNOSIS — E039 Hypothyroidism, unspecified: Secondary | ICD-10-CM | POA: Insufficient documentation

## 2023-05-26 DIAGNOSIS — I5030 Unspecified diastolic (congestive) heart failure: Secondary | ICD-10-CM | POA: Diagnosis not present

## 2023-05-26 DIAGNOSIS — R110 Nausea: Secondary | ICD-10-CM

## 2023-05-26 DIAGNOSIS — R0602 Shortness of breath: Secondary | ICD-10-CM | POA: Diagnosis not present

## 2023-05-26 DIAGNOSIS — R519 Headache, unspecified: Secondary | ICD-10-CM | POA: Insufficient documentation

## 2023-05-26 LAB — CBC
HCT: 42.3 % (ref 36.0–46.0)
Hemoglobin: 14.4 g/dL (ref 12.0–15.0)
MCH: 31.9 pg (ref 26.0–34.0)
MCHC: 34 g/dL (ref 30.0–36.0)
MCV: 93.6 fL (ref 80.0–100.0)
Platelets: 268 10*3/uL (ref 150–400)
RBC: 4.52 MIL/uL (ref 3.87–5.11)
RDW: 13.5 % (ref 11.5–15.5)
WBC: 9.2 10*3/uL (ref 4.0–10.5)
nRBC: 0 % (ref 0.0–0.2)

## 2023-05-26 LAB — BASIC METABOLIC PANEL
Anion gap: 11 (ref 5–15)
BUN: 15 mg/dL (ref 8–23)
CO2: 25 mmol/L (ref 22–32)
Calcium: 9.7 mg/dL (ref 8.9–10.3)
Chloride: 90 mmol/L — ABNORMAL LOW (ref 98–111)
Creatinine, Ser: 1.21 mg/dL — ABNORMAL HIGH (ref 0.44–1.00)
GFR, Estimated: 49 mL/min — ABNORMAL LOW (ref >60)
Glucose, Bld: 89 mg/dL (ref 70–99)
Potassium: 4.2 mmol/L (ref 3.5–5.1)
Sodium: 126 mmol/L — ABNORMAL LOW (ref 135–145)

## 2023-05-26 LAB — TROPONIN I (HIGH SENSITIVITY): Troponin I (High Sensitivity): 8 ng/L (ref <18)

## 2023-05-26 MED ORDER — DIPHENHYDRAMINE HCL 50 MG/ML IJ SOLN
25.0000 mg | Freq: Once | INTRAMUSCULAR | Status: AC
  Administered 2023-05-26: 25 mg via INTRAVENOUS
  Filled 2023-05-26: qty 1

## 2023-05-26 MED ORDER — SODIUM CHLORIDE 0.9 % IV BOLUS
500.0000 mL | Freq: Once | INTRAVENOUS | Status: AC
  Administered 2023-05-26: 500 mL via INTRAVENOUS

## 2023-05-26 MED ORDER — METOCLOPRAMIDE HCL 5 MG/ML IJ SOLN
10.0000 mg | Freq: Once | INTRAMUSCULAR | Status: AC
  Administered 2023-05-26: 10 mg via INTRAVENOUS
  Filled 2023-05-26: qty 2

## 2023-05-26 MED ORDER — PROMETHAZINE HCL 25 MG/ML IJ SOLN
25.0000 mg | Freq: Once | INTRAMUSCULAR | Status: AC
Start: 2023-05-26 — End: 2023-05-26
  Administered 2023-05-26: 25 mg via INTRAMUSCULAR

## 2023-05-26 MED ORDER — CLONIDINE HCL 0.1 MG PO TABS
0.2000 mg | ORAL_TABLET | Freq: Once | ORAL | Status: AC
  Administered 2023-05-26: 0.2 mg via ORAL
  Filled 2023-05-26: qty 2

## 2023-05-26 NOTE — Telephone Encounter (Signed)
Nurse visit appt sch'd for this morning. Pt aware that someone should bring her

## 2023-05-26 NOTE — Addendum Note (Signed)
Addended by: Margarita Mail on: 05/26/2023 11:30 AM   Modules accepted: Level of Service

## 2023-05-26 NOTE — Discharge Instructions (Signed)
Continue taking your blood pressure medications as prescribed and check your blood pressure once or twice daily for the next several days.  Keep a log of these readings to help your doctor adjust your blood pressure regimen.  Return to the ER immediately for new, worsening, or persistent severe headache, dizziness, nausea or vomiting, weakness or lightheadedness, or any other new or worsening symptoms that concern you.

## 2023-05-26 NOTE — Telephone Encounter (Signed)
Copied from CRM [id number]. Topic: General - Other >> May 26, 2023  9:29 AM Pincus Sanes wrote: Reason for CRM: Pls FU Pt was seen Tues and nauesa and headache/med worked 1 day/ pt is now again very nausated and headache. Called office as no appt available/ office suggested CRM if  a PCP could call her in something different. Pls let pt know as miserable/ Pt contact [phone]  [id number] - [place], [place] - [address] AT [facility] OF [address] [facility]  Phone: [phone] Fax: [phone]

## 2023-05-26 NOTE — Telephone Encounter (Signed)
Nurse visit appt schedule per Dr Caralee Ates for injection. Pt aware to bring a driver.

## 2023-05-26 NOTE — Progress Notes (Signed)
Patient presented for promethazine injection for nausea as Zofran was ineffective per Dr. Caralee Ates. Patient was experiencing shortness of breath as well as high blood pressure currently and Dr. Caralee Ates was notified. Patient tolerated injection well and had no questions or concerns to express at time of visit conclusion. Patient was put on Dr. Caralee Ates' schedule following notification of high blood pressure reading and sob.

## 2023-05-26 NOTE — ED Provider Notes (Signed)
[facility] Provider Note    Event Date/Time   First MD Initiated Contact with Patient 05/26/23 1503     (approximate)   History   Headache and Hypertension   HPI  [name] is a 67 y.o. female with a history of diabetes, hypothyroidism, CKD, diastolic CHF, COPD on 2 L home O2 who presents with headache, nausea, and elevated blood pressures.  The patient states that she started to have nausea about 5 days ago and it has persisted since then although she has been able to hold down meals and has not been vomiting.  She denies any abdominal pain or diarrhea.  The patient subsequently started have a headache which is lasted for the last several days.  Is bilateral and frontal and more of a dull ache.  She denies any associated photophobia.  She has no prior history of similar headaches.  The patient's blood pressure was noted to be elevated to the 160s systolic.  She was started on hydralazine a few days ago with no improvement and also had an injection of promethazine with no significant improvement.  The patient denies any cough or fever.  She has no chest pain.  She reports slightly increased shortness of breath compared to baseline.  I reviewed the medical records.  The patient was admitted to the hospital service last month due to left lower extremity cellulitis.   Physical Exam   Triage Vital Signs: ED Triage Vitals  Enc Vitals Group     BP 05/26/23 1159 (!) 169/100     Pulse Rate 05/26/23 1159 81     Resp 05/26/23 1159 20     Temp 05/26/23 1159 98.4 F (36.9 C)     Temp Source 05/26/23 1159 Oral     SpO2 05/26/23 1159 92 %     Weight --      Height --      Head Circumference --      Peak Flow --      Pain Score 05/26/23 1212 8     Pain Loc --      Pain Edu? --      Excl. in GC? --     Most recent vital signs: Vitals:   05/26/23 1630 05/26/23 1722  BP: (!) 108/58   Pulse: 80 81  Resp:  20  Temp:  98.3 F (36.8 C)  SpO2: 90% 93%      General: Alert and oriented, no distress.  CV:  Good peripheral perfusion.  Resp:  Normal effort.  Abd:  No distention.  Other:  EOMI.  PERRLA.  No facial droop.  Normal speech.  Motor and sensory intact in all extremities.  No ataxia.  No pronator drift.   ED Results / Procedures / Treatments   Labs (all labs ordered are listed, but only abnormal results are displayed) Labs Reviewed  BASIC METABOLIC PANEL - Abnormal; Notable for the following components:      Result Value   Sodium 126 (*)    Chloride 90 (*)    Creatinine, Ser 1.21 (*)    GFR, Estimated 49 (*)    All other components within normal limits  CBC  TROPONIN I (HIGH SENSITIVITY)     EKG  ED ECG REPORT I, Dionne Bucy, the attending physician, personally viewed and interpreted this ECG.  Date: 05/26/2023 EKG Time: 1220 Rate: 79 Rhythm: normal sinus rhythm QRS Axis: normal Intervals: normal ST/T Wave abnormalities: normal Narrative Interpretation: no evidence of acute  ischemia    RADIOLOGY  Chest x-ray: I independently viewed and interpreted the images; there is no focal consolidation or edema  CT head:   IMPRESSION:  1. No acute intracranial abnormality.  2. Unchanged mild chronic small-vessel disease.    PROCEDURES:  Critical Care performed: No  Procedures   MEDICATIONS ORDERED IN ED: Medications  cloNIDine (CATAPRES) tablet 0.2 mg (0.2 mg Oral Given 05/26/23 1526)  metoCLOPramide (REGLAN) injection 10 mg (10 mg Intravenous Given 05/26/23 1525)  diphenhydrAMINE (BENADRYL) injection 25 mg (25 mg Intravenous Given 05/26/23 1526)  sodium chloride 0.9 % bolus 500 mL (0 mLs Intravenous Stopped 05/26/23 1721)     IMPRESSION / MDM / ASSESSMENT AND PLAN / ED COURSE  I reviewed the triage vital signs and the nursing notes.  67 year old female with PMH as noted above presents with nausea, headache, and elevated blood pressure.  On exam her blood pressure is elevated but other vitals are  normal.  Neurologic exam is nonfocal.  Differential diagnosis includes, but is not limited to:  Headache: This is most consistent with tension but differential also includes migraine or other benign etiology.  I have a low suspicion for [facility].  Given the patient's age and new type of headache we will obtain a CT.  I have ordered IV Reglan and Benadryl as well as fluids.  Nausea: This could be related to migraine headache versus possible gastroenteritis, gastritis, PUD.  We will reassess after Reglan.  Elevated blood pressure: This is consistent with essential hypertension.  The blood pressure is not critically high at this time and the patient has no signs or symptoms of endorgan damage.  Will obtain basic labs, troponin, and give p.o. clonidine.   Patient's presentation is most consistent with acute presentation with potential threat to life or bodily function.  The patient is on the cardiac monitor to evaluate for evidence of arrhythmia and/or significant heart rate changes.   ----------------------------------------- 4:56 PM on 05/26/2023 -----------------------------------------  CT shows no acute findings.  Blood pressure has significantly improved.  The patient states her headache has resolved.  She would like to go home.  The patient is stable for discharge at this time.  I counseled her on the results of the workup.  I gave her strict return precautions and she expresses understanding.  FINAL CLINICAL IMPRESSION(S) / ED DIAGNOSES   Final diagnoses:  Acute nonintractable headache, unspecified headache type  Hypertension, unspecified type     Rx / DC Orders   ED Discharge Orders     None        Note:  This document was prepared using Dragon voice recognition software and may include unintentional dictation errors.    Dionne Bucy, MD 05/26/23 2328

## 2023-05-26 NOTE — Progress Notes (Addendum)
   Acute Office Visit  Subjective:     Patient ID: [name], female    DOB: 06/04/1956, 67 y.o.   MRN: [id number]  Chief Complaint  Patient presents with   Nausea   Blood Pressure Check    HPI Patient is in today for elevated blood pressure. Patient has a headache and blurred vision for 1 week. Denies chest pain or shortness of breath. Patient was seen here for viral gastroenteritis 2 days ago, was prescribed Zofran which she states is not working for her, still nauseated and vomiting. No fevers. BP was high at LOV, was prescribed Hydroxyzine 10 mg to take as needed for BP >150/90 but blood pressure has still been elevated.   Review of Systems  Constitutional:  Negative for chills and fever.  Eyes:  Positive for blurred vision.  Respiratory:  Negative for shortness of breath.   Cardiovascular:  Negative for chest pain.  Gastrointestinal:  Positive for nausea and vomiting.  Neurological:  Positive for headaches.        Objective:    BP (!) 200/98   Pulse 78   Temp 98.1 F (36.7 C) (Oral)   Resp (!) 22   Ht 5\' 6"  (1.676 m) Comment: per chart  Wt 185 lb (83.9 kg) Comment: per chart  SpO2 91%   BMI 29.86 kg/m   Vitals:   05/26/23 1030 05/26/23 1051 05/26/23 1115  BP: (!) 200/110 (!) 190/110 (!) 200/98    BP Readings from Last 3 Encounters:  05/26/23 (!) 200/98  05/24/23 (!) 158/78  04/29/23 139/67   Wt Readings from Last 3 Encounters:  05/26/23 185 lb (83.9 kg)  05/24/23 185 lb 4.8 oz (84.1 kg)  04/26/23 191 lb (86.6 kg)      Physical Exam Constitutional:      Appearance: Normal appearance.  HENT:     Head: Normocephalic and atraumatic.  Eyes:     Extraocular Movements: Extraocular movements intact.     Conjunctiva/sclera: Conjunctivae normal.     Pupils: Pupils are equal, round, and reactive to light.  Cardiovascular:     Rate and Rhythm: Normal rate and regular rhythm.  Pulmonary:     Effort: Pulmonary effort is normal.     Breath sounds: Normal  breath sounds.  Skin:    General: Skin is warm and dry.  Neurological:     General: No focal deficit present.     Mental Status: She is alert. Mental status is at baseline.  Psychiatric:        Mood and Affect: Mood normal.        Behavior: Behavior normal.     No results found for any visits on 05/26/23.      Assessment & Plan:   1. Hypertensive urgency: Blood pressure elevated multiple times here in the office with headache and blurred vision x 1 week. Patient has been taking Hydroxyzine, took one in the office however BP 15 minutes later still 200/90, symptoms not improved. Will send patient to ER for further evaluation.   2. Nausea: On the schedule initially for continued nausea in the setting of GI virus, IM Phenergan administered.   - promethazine (PHENERGAN) injection 25 mg  Return for already scheduled.  Margarita Mail, DO

## 2023-05-26 NOTE — ED Triage Notes (Addendum)
Pt to ED for h/a since Thursday, nausea since Friday. Also reports having htn, does take meds. Endorses shob. Wears o2 at night.  Hx COPD, CHF  Pt with pursed lip breathing, able to speak in complete sentences

## 2023-05-31 ENCOUNTER — Telehealth: Payer: Self-pay

## 2023-05-31 NOTE — Telephone Encounter (Signed)
Transition Care Management Follow-up Telephone Call Date of discharge and from where: [facility] 6/13 How have you been since you were released from the hospital? Doing ok Any questions or concerns? No  Items Reviewed: Did the pt receive and understand the discharge instructions provided? Yes  Medications obtained and verified? Yes  Other? No  Any new allergies since your discharge? No  Dietary orders reviewed? No Do you have support at home? Yes     Follow up appointments reviewed:  PCP Hospital f/u appt confirmed? No  Scheduled to see  on  @ . Specialist Hospital f/u appt confirmed? No  Scheduled to see  on  @ . Are transportation arrangements needed? No  If their condition worsens, is the pt aware to call PCP or go to the Emergency Dept.? Yes Was the patient provided with contact information for the PCP's office or ED? Yes Was to pt encouraged to call back with questions or concerns? Yes

## 2023-06-04 ENCOUNTER — Emergency Department
Admission: EM | Admit: 2023-06-04 | Discharge: 2023-06-04 | Disposition: A | Discharge status: Home / Self Care | Payer: 59 | Attending: Student in an Organized Health Care Education/Training Program | Admitting: Student in an Organized Health Care Education/Training Program

## 2023-06-04 DIAGNOSIS — E119 Type 2 diabetes mellitus without complications: Secondary | ICD-10-CM | POA: Diagnosis not present

## 2023-06-04 DIAGNOSIS — I10 Essential (primary) hypertension: Secondary | ICD-10-CM

## 2023-06-04 DIAGNOSIS — R519 Headache, unspecified: Secondary | ICD-10-CM

## 2023-06-04 DIAGNOSIS — I509 Heart failure, unspecified: Secondary | ICD-10-CM | POA: Insufficient documentation

## 2023-06-04 DIAGNOSIS — I110 Hypertensive heart disease with heart failure: Secondary | ICD-10-CM | POA: Diagnosis not present

## 2023-06-04 MED ORDER — CLONIDINE HCL 0.1 MG PO TABS
0.2000 mg | ORAL_TABLET | Freq: Once | ORAL | Status: AC
  Administered 2023-06-04: 0.2 mg via ORAL
  Filled 2023-06-04: qty 2

## 2023-06-04 MED ORDER — CLONIDINE HCL 0.2 MG PO TABS: 0.2000 mg | ORAL_TABLET | Freq: Two times a day (BID) | ORAL | 0 refills | Status: DC

## 2023-06-04 MED ORDER — ACETAMINOPHEN 500 MG PO TABS
1000.0000 mg | ORAL_TABLET | Freq: Once | ORAL | Status: AC
  Administered 2023-06-04: 1000 mg via ORAL
  Filled 2023-06-04: qty 2

## 2023-06-04 MED ORDER — ONDANSETRON 4 MG PO TBDP
4.0000 mg | ORAL_TABLET | Freq: Once | ORAL | Status: AC
  Administered 2023-06-04: 4 mg via ORAL
  Filled 2023-06-04: qty 1

## 2023-06-04 NOTE — ED Triage Notes (Signed)
Pt sts that she has been having high BP. Pt sts that she has been taking her meds but it has not been working.

## 2023-06-04 NOTE — Discharge Instructions (Signed)
PLEASE CALL YOU PRIMARY CARE DOCTOR ON MONDAY TO SCHEDULE A FOLLOW UP APPOINTMENT.  Return to the ER for symptoms that change, worsen, or for new concerns if unable to schedule an appointment with primary care.

## 2023-06-04 NOTE — ED Provider Notes (Signed)
[facility] Provider Note    Event Date/Time   First MD Initiated Contact with Patient 06/04/23 1347     (approximate)   History   Hypertension   HPI  [name] is a 67 y.o. female with history of hypertension, CHF, ARF, GERD, Diabetes II and as listed in EMR presents to the emergency department for treatment and evaluation of hypertension.  Patient states that she has been compliant with her medications but has still had a headache.  When she checks her blood pressure at home it has been as high as180/100 and wants her medication to be adjusted.    Physical Exam   Triage Vital Signs: ED Triage Vitals [06/04/23 1314]  Enc Vitals Group     BP (!) 191/116     Pulse Rate 90     Resp 17     Temp 98.4 F (36.9 C)     Temp Source Oral     SpO2 93 %     Weight 185 lb (83.9 kg)     Height 5\' 6"  (1.676 m)     Head Circumference      Peak Flow      Pain Score 7     Pain Loc      Pain Edu?      Excl. in GC?     Most recent vital signs: Vitals:   06/04/23 1419 06/04/23 1446  BP: (!) 166/87 (!) 167/88  Pulse:    Resp:    Temp:    SpO2:      General: Awake, no distress.  CV:  Good peripheral perfusion.  Resp:  Normal effort. Wearing home O2. Abd:  No distention.  Other:     ED Results / Procedures / Treatments   Labs (all labs ordered are listed, but only abnormal results are displayed) Labs Reviewed - No data to display   EKG  Not indicated.   RADIOLOGY   Not indicated.  PROCEDURES:  Critical Care performed: No  Procedures   MEDICATIONS ORDERED IN ED:  Medications  cloNIDine (CATAPRES) tablet 0.2 mg (0.2 mg Oral Given 06/04/23 1419)  ondansetron (ZOFRAN-ODT) disintegrating tablet 4 mg (4 mg Oral Given 06/04/23 1426)  acetaminophen (TYLENOL) tablet 1,000 mg (1,000 mg Oral Given 06/04/23 1426)     IMPRESSION / MDM / ASSESSMENT AND PLAN / ED COURSE   I have reviewed the triage note.  Differential diagnosis  includes, but is not limited to, hypertension, migraine, stress/anxiety  Patient's presentation is most consistent with acute presentation with potential threat to life or bodily function.  67 year old female presenting to the emergency department for treatment of hypertension.  She states that she was here about a week ago for the same.  She has been compliant with her medications.  Vitals from triage reviewed.  Patient is hypertensive at 191/116.  Last visit note reviewed.  She had a full workup that day including head CT.  She denies any new symptoms since she was last here.  Plan will be to give her 0.2 clonidine and reevaluate in about an hour.  Her pressure is currently 176/90.  Patient ready to leave and wants discharge paperwork. BP is now 167/88. Nausea is gone and headache is much better. She is to follow up with primary care this week. ER return precautions discussed as well.      FINAL CLINICAL IMPRESSION(S) / ED DIAGNOSES   Final diagnoses:  Hypertension, unspecified type  Acute nonintractable headache, unspecified headache  type     Rx / DC Orders   ED Discharge Orders          Ordered    cloNIDine (CATAPRES) 0.2 MG tablet  2 times daily        06/04/23 1457             Note:  This document was prepared using Dragon voice recognition software and may include unintentional dictation errors.   Chinita Pester, FNP 06/04/23 1502    Willy Eddy, MD 06/04/23 [phone]

## 2023-06-06 ENCOUNTER — Ambulatory Visit (INDEPENDENT_AMBULATORY_CARE_PROVIDER_SITE_OTHER): Payer: 59 | Admitting: Internal Medicine

## 2023-06-06 ENCOUNTER — Telehealth: Payer: Self-pay

## 2023-06-06 VITALS — BP 140/90 | HR 70 | Ht 66.0 in | Wt 188.4 lb

## 2023-06-06 DIAGNOSIS — G44229 Chronic tension-type headache, not intractable: Secondary | ICD-10-CM | POA: Diagnosis not present

## 2023-06-06 DIAGNOSIS — I10 Essential (primary) hypertension: Secondary | ICD-10-CM

## 2023-06-06 MED ORDER — BUTALBITAL-APAP-CAFFEINE 50-325-40 MG PO TABS
1.0000 | ORAL_TABLET | Freq: Four times a day (QID) | ORAL | 0 refills | Status: AC | PRN
Start: 2023-06-06 — End: 2023-06-16

## 2023-06-06 MED ORDER — OLMESARTAN MEDOXOMIL 40 MG PO TABS
40.0000 mg | ORAL_TABLET | Freq: Every day | ORAL | 11 refills | Status: DC
Start: 2023-06-06 — End: 2023-07-19

## 2023-06-06 NOTE — Progress Notes (Signed)
Established Patient Office Visit  Subjective:  Patient ID: [name], female    DOB: [birth year]  Age: 67 y.o. MRN: [id number]  Chief Complaint  Patient presents with   Acute Visit    Acute Visit Arthiritis, High BP    No new complaints, here to transfer IM care and medication refills. Reports frontal headache x 10 days unrelieved by Tylenol. Went to the ER for elevated BP and headache   No other concerns at this time.   Past Medical History:  Diagnosis Date   (HFpEF) heart failure with preserved ejection fraction (HCC)    Acute renal failure (HCC) 03/29/2011   Acute respiratory distress 11/2020   Acute respiratory failure (HCC) 04/14/2014   Anxiety    Arthritis    CHF (congestive heart failure) (HCC)    Chronic respiratory failure with hypoxia and hypercapnia (HCC)    Community acquired pneumonia 01/10/2013   COPD (chronic obstructive pulmonary disease) (HCC)    no inhalers--smoker, no oxygen   Depression    Dyspnea    Emphysema lung (HCC)    GERD (gastroesophageal reflux disease)    Hyperlipidemia    Hypertension    Hypothyroid    Peripheral vascular disease (HCC)    Pre-diabetes    Restless leg syndrome    Seizures (HCC)    02/13/2011 -hx of seizure due to "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  no seizure since--pt does take keppra and office note from neurologist dr. Marjory Lies on this chart   Urinary incontinence     Past Surgical History:  Procedure Laterality Date   CARPAL TUNNEL RELEASE     left   COLONOSCOPY WITH PROPOFOL N/A 06/17/2022   Procedure: COLONOSCOPY WITH PROPOFOL;  Surgeon: Regis Bill, MD;  Location: [facility] ENDOSCOPY;  Service: Endoscopy;  Laterality: N/A;   JOINT REPLACEMENT  08/26/2011   right total knee arthroplasty, left total knee   KNEE ARTHROPLASTY  10/28/2011   Procedure: COMPUTER ASSISTED TOTAL KNEE ARTHROPLASTY;  Surgeon:  Carlisle Beers Rendall III;  Location: [facility]S;  Service: Orthopedics;  Laterality: Left;  preop femoral nerve block   KNEE SURGERY     right   laminectomy and diskectomy     L4-5 with fusion   PROCTOSCOPY N/A 06/18/2022   Procedure: RIGID PROCTOSCOPY;  Surgeon: Karie Soda, MD;  Location: [facility];  Service: General;  Laterality: N/A;    Social History   Socioeconomic History   Marital status: Widowed    Spouse name: Not on file   Number of children: Not on file   Years of education: Not on file   Highest education level: Not on file  Occupational History   Not on file  Tobacco Use   Smoking status: Every Day    Packs/day: 2.00    Years: 50.00    Additional pack years: 0.00    Total pack years: 100.00    Types: Cigarettes   Smokeless tobacco: Never  Vaping Use   Vaping Use: Every day   Substances: Nicotine  Substance and Sexual Activity   Alcohol use: Yes    Comment: occasional   Drug use: No   Sexual activity: Yes    Birth control/protection: Post-menopausal  Other Topics Concern   Not on file  Social History Narrative   ** Merged History Encounter **       Social Determinants of Health   Financial  Resource Strain: Not on file  Food Insecurity: No Food Insecurity (05/03/2023)   Hunger Vital Sign    Worried About Running Out of Food in the Last Year: Never true    Ran Out of Food in the Last Year: Never true  Transportation Needs: No Transportation Needs (05/03/2023)   PRAPARE - Administrator, Civil Service (Medical): No    Lack of Transportation (Non-Medical): No  Physical Activity: Not on file  Stress: Not on file  Social Connections: Not on file  Intimate Partner Violence: Not At Risk (04/27/2023)   Humiliation, Afraid, Rape, and Kick questionnaire    Fear of Current or Ex-Partner: No    Emotionally Abused: No    Physically Abused: No    Sexually Abused: No    Family History  Problem Relation Age of Onset   Heart attack Father 19   Alzheimer'[name]  disease Mother    Breast cancer Neg Hx     Allergies  Allergen Reactions   Nsaids Other (See Comments)    Pt states it messes up her kidneys But takes aspirin & ibuprofen   Thiazide-Type Diuretics     Other reaction(Leyanna Bittman): Unknown    Review of Systems  Constitutional: Negative.   HENT: Negative.    Eyes: Negative.   Respiratory:  Positive for shortness of breath (on exertion).   Cardiovascular: Negative.   Gastrointestinal: Negative.   Genitourinary: Negative.   Skin: Negative.   Neurological:  Positive for headaches.  Endo/Heme/Allergies: Negative.        Objective:   BP (!) 140/90   Pulse 70   Ht 5\' 6"  (1.676 m)   Wt 188 lb 6.4 oz (85.5 kg)   SpO2 97%   BMI 30.41 kg/m   Vitals:   06/06/23 1136  BP: (!) 140/90  Pulse: 70  Height: 5\' 6"  (1.676 m)  Weight: 188 lb 6.4 oz (85.5 kg)  SpO2: 97%  BMI (Calculated): 30.42    Physical Exam Vitals reviewed.  Constitutional:      General: She is not in acute distress. HENT:     Head: Normocephalic.     Nose: Nose normal.     Mouth/Throat:     Mouth: Mucous membranes are moist.  Eyes:     Extraocular Movements: Extraocular movements intact.     Pupils: Pupils are equal, round, and reactive to light.  Cardiovascular:     Rate and Rhythm: Normal rate and regular rhythm.     Heart sounds: No murmur heard. Pulmonary:     Effort: Pulmonary effort is normal.     Breath sounds: Decreased air movement present. No rhonchi or rales.  Abdominal:     General: Abdomen is flat.     Palpations: There is no hepatomegaly, splenomegaly or mass.  Musculoskeletal:        General: Normal range of motion.     Cervical back: Normal range of motion. No tenderness.  Skin:    General: Skin is warm and dry.  Neurological:     General: No focal deficit present.     Mental Status: She is alert and oriented to person, place, and time.     Cranial Nerves: No cranial nerve deficit.     Motor: No weakness.  Psychiatric:        Mood  and Affect: Mood normal.        Behavior: Behavior normal.      No results found for any visits on 06/06/23.  Assessment & Plan:  As per problem list  Problem List Items Addressed This Visit       Cardiovascular and Mediastinum   Hypertension   Relevant Medications   olmesartan (BENICAR) 40 MG tablet   Other Visit Diagnoses     Chronic tension-type headache, not intractable    -  Primary   Relevant Medications   butalbital-acetaminophen-caffeine (FIORICET) 50-325-40 MG tablet       Return in about 2 weeks (around 06/20/2023) for BP and headache followup.   Total time spent: 30 minutes  Luna Fuse, MD  06/06/2023   This document may have been prepared by [facility] Voice Recognition software and as such may include unintentional dictation errors.

## 2023-06-13 ENCOUNTER — Ambulatory Visit: Payer: 59 | Admitting: Internal Medicine

## 2023-06-14 ENCOUNTER — Ambulatory Visit (INDEPENDENT_AMBULATORY_CARE_PROVIDER_SITE_OTHER): Payer: 59 | Admitting: Nurse Practitioner

## 2023-06-14 ENCOUNTER — Encounter (INDEPENDENT_AMBULATORY_CARE_PROVIDER_SITE_OTHER): Payer: Self-pay | Admitting: Nurse Practitioner

## 2023-06-14 ENCOUNTER — Ambulatory Visit (INDEPENDENT_AMBULATORY_CARE_PROVIDER_SITE_OTHER): Payer: 59

## 2023-06-14 VITALS — BP 190/95 | HR 72 | Resp 16 | Ht 66.0 in | Wt 184.0 lb

## 2023-06-14 DIAGNOSIS — I8312 Varicose veins of left lower extremity with inflammation: Secondary | ICD-10-CM | POA: Diagnosis not present

## 2023-06-14 DIAGNOSIS — I10 Essential (primary) hypertension: Secondary | ICD-10-CM

## 2023-06-14 DIAGNOSIS — M542 Cervicalgia: Secondary | ICD-10-CM | POA: Diagnosis not present

## 2023-06-14 DIAGNOSIS — I83892 Varicose veins of left lower extremities with other complications: Secondary | ICD-10-CM

## 2023-06-14 DIAGNOSIS — Z720 Tobacco use: Secondary | ICD-10-CM

## 2023-06-14 DIAGNOSIS — I8311 Varicose veins of right lower extremity with inflammation: Secondary | ICD-10-CM

## 2023-06-15 ENCOUNTER — Encounter (INDEPENDENT_AMBULATORY_CARE_PROVIDER_SITE_OTHER): Payer: Self-pay | Admitting: Nurse Practitioner
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
## 2023-06-17 DIAGNOSIS — J449 Chronic obstructive pulmonary disease, unspecified: Secondary | ICD-10-CM | POA: Diagnosis not present

## 2023-06-20 ENCOUNTER — Ambulatory Visit (INDEPENDENT_AMBULATORY_CARE_PROVIDER_SITE_OTHER): Payer: 59 | Admitting: Internal Medicine

## 2023-06-20 ENCOUNTER — Ambulatory Visit: Payer: 59 | Admitting: Urology

## 2023-06-20 ENCOUNTER — Other Ambulatory Visit: Payer: Self-pay | Admitting: Internal Medicine

## 2023-06-20 VITALS — BP 180/120 | HR 96 | Ht 66.0 in | Wt 182.0 lb

## 2023-06-20 DIAGNOSIS — I10 Essential (primary) hypertension: Secondary | ICD-10-CM

## 2023-06-20 MED ORDER — CLONIDINE HCL 0.2 MG PO TABS
0.2000 mg | ORAL_TABLET | Freq: Two times a day (BID) | ORAL | 0 refills | Status: DC
Start: 2023-06-20 — End: 2023-06-20

## 2023-06-20 MED ORDER — CLONIDINE HCL 0.2 MG PO TABS
0.2000 mg | ORAL_TABLET | Freq: Two times a day (BID) | ORAL | 0 refills | Status: DC
Start: 2023-06-20 — End: 2023-07-19

## 2023-06-20 NOTE — Progress Notes (Signed)
Established Patient Office Visit  Subjective:  Patient ID: [name], female    DOB: 07-Feb-1956  Age: 67 y.o. MRN: [id number]  Chief Complaint  Patient presents with   Follow-up    BP Check and Headache    BP very high but stopped taking Catapress. Denies headaches, dizziness, SOB or chest pain.    No other concerns at this time.   Past Medical History:  Diagnosis Date   (HFpEF) heart failure with preserved ejection fraction (HCC)    Acute renal failure (HCC) 03/29/2011   Acute respiratory distress 11/2020   Acute respiratory failure (HCC) 04/14/2014   Anxiety    Arthritis    CHF (congestive heart failure) (HCC)    Chronic respiratory failure with hypoxia and hypercapnia (HCC)    Community acquired pneumonia 01/10/2013   COPD (chronic obstructive pulmonary disease) (HCC)    no inhalers--smoker, no oxygen   Depression    Dyspnea    Emphysema lung (HCC)    GERD (gastroesophageal reflux disease)    Hyperlipidemia    Hypertension    Hypothyroid    Peripheral vascular disease (HCC)    Pre-diabetes    Restless leg syndrome    Seizures (HCC)    02/13/2011 -hx of seizure due to "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  no seizure since--pt does take keppra and office note from neurologist dr. Marjory Lies on this chart   Urinary incontinence     Past Surgical History:  Procedure Laterality Date   CARPAL TUNNEL RELEASE     left   COLONOSCOPY WITH PROPOFOL N/A 06/17/2022   Procedure: COLONOSCOPY WITH PROPOFOL;  Surgeon: Regis Bill, MD;  Location: [facility];  Service: Endoscopy;  Laterality: N/A;   JOINT REPLACEMENT  08/26/2011   right total knee arthroplasty, left total knee   KNEE ARTHROPLASTY  10/28/2011   Procedure: COMPUTER ASSISTED TOTAL KNEE ARTHROPLASTY;  Surgeon: Carlisle Beers Rendall III;  Location: [facility];  Service: Orthopedics;  Laterality: Left;  preop  femoral nerve block   KNEE SURGERY     right   laminectomy and diskectomy     L4-5 with fusion   PROCTOSCOPY N/A 06/18/2022   Procedure: RIGID PROCTOSCOPY;  Surgeon: Karie Soda, MD;  Location: [facility];  Service: General;  Laterality: N/A;    Social History   Socioeconomic History   Marital status: Widowed    Spouse name: Not on file   Number of children: Not on file   Years of education: Not on file   Highest education level: Not on file  Occupational History   Not on file  Tobacco Use   Smoking status: Every Day    Packs/day: 2.00    Years: 50.00    Additional pack years: 0.00    Total pack years: 100.00    Types: Cigarettes   Smokeless tobacco: Never  Vaping Use   Vaping Use: Every day   Substances: Nicotine  Substance and Sexual Activity   Alcohol use: Yes    Comment: occasional   Drug use: No   Sexual activity: Yes    Birth control/protection: Post-menopausal  Other Topics Concern   Not on file  Social History Narrative   ** Merged History Encounter **       Social Determinants of Health   Financial Resource Strain: Not on file  Food Insecurity: No Food Insecurity (05/03/2023)   Hunger Vital  Sign    Worried About Programme researcher, broadcasting/film/video in the Last Year: Never true    Ran Out of Food in the Last Year: Never true  Transportation Needs: No Transportation Needs (05/03/2023)   PRAPARE - Administrator, Civil Service (Medical): No    Lack of Transportation (Non-Medical): No  Physical Activity: Not on file  Stress: Not on file  Social Connections: Not on file  Intimate Partner Violence: Not At Risk (04/27/2023)   Humiliation, Afraid, Rape, and Kick questionnaire    Fear of Current or Ex-Partner: No    Emotionally Abused: No    Physically Abused: No    Sexually Abused: No    Family History  Problem Relation Age of Onset   Heart attack Father 32   Alzheimer'Fujiko Picazo disease Mother    Breast cancer Neg Hx     Allergies  Allergen Reactions   Nsaids  Other (See Comments)    Pt states it messes up her kidneys But takes aspirin & ibuprofen   Thiazide-Type Diuretics     Other reaction(Rabecka Brendel): Unknown    Review of Systems  Constitutional: Negative.   HENT: Negative.    Eyes: Negative.   Respiratory:  Positive for shortness of breath (on exertion).   Cardiovascular: Negative.   Gastrointestinal: Negative.   Genitourinary: Negative.   Skin: Negative.   Neurological:  Positive for headaches.  Endo/Heme/Allergies: Negative.        Objective:   BP (!) 180/120   Pulse 96   Ht 5\' 6"  (1.676 m)   Wt 182 lb (82.6 kg)   SpO2 97%   BMI 29.38 kg/m   Vitals:   06/20/23 1342 06/20/23 1353  BP: (!) 190/126 (!) 180/120  Pulse: 96   Height: 5\' 6"  (1.676 m)   Weight: 182 lb (82.6 kg)   SpO2: 97%   BMI (Calculated): 29.39     Physical Exam Vitals reviewed.  Constitutional:      General: She is not in acute distress. HENT:     Head: Normocephalic.     Nose: Nose normal.     Mouth/Throat:     Mouth: Mucous membranes are moist.  Eyes:     Extraocular Movements: Extraocular movements intact.     Pupils: Pupils are equal, round, and reactive to light.  Cardiovascular:     Rate and Rhythm: Normal rate and regular rhythm.     Heart sounds: No murmur heard. Pulmonary:     Effort: Pulmonary effort is normal.     Breath sounds: Decreased air movement present. No rhonchi or rales.  Abdominal:     General: Abdomen is flat.     Palpations: There is no hepatomegaly, splenomegaly or mass.  Musculoskeletal:        General: Normal range of motion.     Cervical back: Normal range of motion. No tenderness.  Skin:    General: Skin is warm and dry.  Neurological:     General: No focal deficit present.     Mental Status: She is alert and oriented to person, place, and time.     Cranial Nerves: No cranial nerve deficit.     Motor: No weakness.  Psychiatric:        Mood and Affect: Mood normal.        Behavior: Behavior normal.       No results found for any visits on 06/20/23.      Assessment & Plan:  Resume catapress. Pt declines going to  the ER now. Problem List Items Addressed This Visit       Cardiovascular and Mediastinum   Hypertension - Primary   Relevant Medications   cloNIDine (CATAPRES) 0.2 MG tablet    Return in about 1 week (around 06/27/2023).   Total time spent: 20 minutes  Luna Fuse, MD  06/20/2023   This document may have been prepared by [facility] Voice Recognition software and as such may include unintentional dictation errors.

## 2023-06-23 ENCOUNTER — Telehealth: Payer: Self-pay

## 2023-06-23 NOTE — Progress Notes (Deleted)
Name: CHANNELL QUATTRONE   MRN: [id number]    DOB: [birth year]   Date:06/23/2023       Progress Note  Subjective  Chief Complaint  No chief complaint on file.   HPI  Patient presents for annual CPE.  Diet: *** Exercise: ***  Last Eye Exam: 1/24 Last Dental Exam: ***  Flowsheet Row Telemedicine from 12/22/2022 in [facility]  AUDIT-C Score 1      Depression: Phq 9 is  {Desc; negative/positive:13464}    02/22/2023    2:17 PM 02/11/2023    2:00 PM 01/04/2023    1:45 PM 12/22/2022   10:51 AM 12/16/2022    8:01 AM  Depression screen PHQ 2/9  Decreased Interest 0 0 2 0 0  Down, Depressed, Hopeless 0 0 2 0 0  PHQ - 2 Score 0 0 4 0 0  Altered sleeping 0 0 3 0 0  Tired, decreased energy 0 0 3 0 0  Change in appetite 0 0 3 0 0  Feeling bad or failure about yourself  0 0 0 0 0  Trouble concentrating 0 0 0 0 0  Moving slowly or fidgety/restless 0 0 0 0 0  Suicidal thoughts 0 0 0 0 0  PHQ-9 Score 0 0 13 0 0  Difficult doing work/chores Not difficult at all Not difficult at all  Not difficult at all Not difficult at all   Hypertension: BP Readings from Last 3 Encounters:  06/20/23 (!) 180/120  06/14/23 (!) 190/95  06/06/23 (!) 140/90   Obesity: Wt Readings from Last 3 Encounters:  06/20/23 182 lb (82.6 kg)  06/14/23 184 lb (83.5 kg)  06/06/23 188 lb 6.4 oz (85.5 kg)   BMI Readings from Last 3 Encounters:  06/20/23 29.38 kg/m  06/14/23 29.70 kg/m  06/06/23 30.41 kg/m     Vaccines:   HPV: N/A Tdap: ins will not cover Shingrix:  Pneumonia: Complete Flu: 9/23 COVID-19:N/A   Hep C Screening: Complete STD testing and prevention (HIV/chl/gon/syphilis):  Intimate partner violence: {Desc; negative/positive:13464} screen  Sexual History : Menstrual History/LMP/Abnormal Bleeding:  Discussed importance of follow up if any post-menopausal bleeding: {Response; yes/no/na:66}  Incontinence Symptoms: {Desc; negative/positive:13464} for symptoms    Breast cancer:  - Last Mammogram: 9/23 - BRCA gene screening:   Osteoporosis Prevention : Discussed high calcium and vitamin D supplementation, weight bearing exercises Bone density :ordered 12/22/22   Cervical cancer screening: Pap due today  Skin cancer: Discussed monitoring for atypical lesions  Colorectal cancer: 6/23   Lung cancer:  Low Dose CT Chest recommended if Age 53-80 years, 20 pack-year currently smoking OR have quit w/in 15years. Patient does qualify for screen  Ordered on 03/15/23 ECG: 05/27/23  Advanced Care Planning: A voluntary discussion about advance care planning including the explanation and discussion of advance directives.  Discussed health care proxy and Living will, and the patient was able to identify a health care proxy as ***.  Patient {DOES_DOES XLK:44010} have a living will and power of attorney of health care   Lipids: Lab Results  Component Value Date   CHOL 154 11/15/2022   CHOL  03/13/2010    98        ATP III CLASSIFICATION:  <200     mg/dL   Desirable  272-536  mg/dL   Borderline High  >=644    mg/dL   High          Lab Results  Component Value Date   HDL 58  11/15/2022   HDL 24 (L) 03/13/2010   Lab Results  Component Value Date   LDLCALC 78 11/15/2022   LDLCALC  03/13/2010    61        Total Cholesterol/HDL:CHD Risk Coronary Heart Disease Risk Table                     Men   Women  1/2 Average Risk   3.4   3.3  Average Risk       5.0   4.4  2 X Average Risk   9.6   7.1  3 X Average Risk  23.4   11.0        Use the calculated Patient Ratio above and the CHD Risk Table to determine the patient's CHD Risk.        ATP III CLASSIFICATION (LDL):  <100     mg/dL   Optimal  161-096  mg/dL   Near or Above                    Optimal  130-159  mg/dL   Borderline  045-409  mg/dL   High  >811     mg/dL   Very High   Lab Results  Component Value Date   TRIG 98 11/15/2022   TRIG 66 03/13/2010   Lab Results  Component Value Date    CHOLHDL 2.7 11/15/2022   CHOLHDL 4.1 03/13/2010   No results found for: "LDLDIRECT"  Glucose: Glucose, Bld  Date Value Ref Range Status  05/26/2023 89 70 - 99 mg/dL Final    Comment:    Glucose reference range applies only to samples taken after fasting for at least 8 hours.  04/29/2023 99 70 - 99 mg/dL Final    Comment:    Glucose reference range applies only to samples taken after fasting for at least 8 hours.  04/28/2023 109 (H) 70 - 99 mg/dL Final    Comment:    Glucose reference range applies only to samples taken after fasting for at least 8 hours.   Glucose-Capillary  Date Value Ref Range Status  04/29/2023 119 (H) 70 - 99 mg/dL Final    Comment:    Glucose reference range applies only to samples taken after fasting for at least 8 hours.  04/29/2023 115 (H) 70 - 99 mg/dL Final    Comment:    Glucose reference range applies only to samples taken after fasting for at least 8 hours.  04/28/2023 124 (H) 70 - 99 mg/dL Final    Comment:    Glucose reference range applies only to samples taken after fasting for at least 8 hours.    Patient Active Problem List   Diagnosis Date Noted   Back pain 05/24/2023   Cellulitis of left lower extremity 04/26/2023   Infected laceration 04/26/2023   Stage 3b chronic kidney disease (HCC) 04/26/2023   Hyperkalemia 04/26/2023   Sepsis due to pneumonia (HCC) 07/06/2022   Severe sepsis (HCC) 07/06/2022   Type 2 diabetes mellitus with peripheral neuropathy (HCC) 07/06/2022   Hypothyroidism, unspecified 07/06/2022   Normocytic anemia 06/19/2022   Complete rectal prolapse 06/18/2022   Chronic respiratory failure with hypoxia (HCC) 06/18/2022   GERD (gastroesophageal reflux disease) 04/06/2022   Restless leg syndrome 04/06/2022   AKI (acute kidney injury) (HCC) 04/06/2022   Delayed gastric emptying 04/06/2022   Anxiety and depression 01/25/2021   Essential hypertension 01/25/2021   Acquired hypothyroidism 01/25/2021   Acute on chronic  respiratory failure with hypoxia (HCC) 11/17/2020   Protein calorie malnutrition (HCC) 11/17/2020   Hypothyroidism 11/17/2020   Leg pain 02/05/2020   Myofascial pain syndrome 08/23/2018   Lumbar spondylosis 08/23/2018   Leg swelling 07/31/2018   Tobacco abuse 07/31/2018   Fusion of lumbar spine (L3-S1) 07/20/2018   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   Lumbar degenerative disc disease 07/20/2018   Chronic pain syndrome 04/18/2014   Respiratory failure (HCC) 04/14/2014   UTI (urinary tract infection) 04/28/2013   Weakness generalized 04/28/2013   Chronic diastolic CHF (congestive heart failure) (HCC) 04/28/2013   Sinus tachycardia 01/11/2013   COPD with acute exacerbation (HCC) 01/10/2013   Diastolic CHF, acute on chronic (HCC) 01/10/2013   Osteoarthritis of left knee 10/27/2011   COPD (chronic obstructive pulmonary disease) (HCC) 03/29/2011   Hypertension 03/29/2011    Past Surgical History:  Procedure Laterality Date   CARPAL TUNNEL RELEASE     left   COLONOSCOPY WITH PROPOFOL N/A 06/17/2022   Procedure: COLONOSCOPY WITH PROPOFOL;  Surgeon: Regis Bill, MD;  Location: [facility] ENDOSCOPY;  Service: Endoscopy;  Laterality: N/A;   JOINT REPLACEMENT  08/26/2011   right total knee arthroplasty, left total knee   KNEE ARTHROPLASTY  10/28/2011   Procedure: COMPUTER ASSISTED TOTAL KNEE ARTHROPLASTY;  Surgeon: Carlisle Beers Rendall III;  Location: [facility];  Service: Orthopedics;  Laterality: Left;  preop femoral nerve block   KNEE SURGERY     right   laminectomy and diskectomy     L4-5 with fusion   PROCTOSCOPY N/A 06/18/2022   Procedure: RIGID PROCTOSCOPY;  Surgeon: Karie Soda, MD;  Location: [facility];  Service: General;  Laterality: N/A;    Family History  Problem Relation Age of Onset   Heart attack Father 32   Alzheimer's disease Mother    Breast cancer Neg Hx     Social History   Socioeconomic History   Marital status: Widowed    Spouse name: Not on file   Number of  children: Not on file   Years of education: Not on file   Highest education level: Not on file  Occupational History   Not on file  Tobacco Use   Smoking status: Every Day    Current packs/day: 2.00    Average packs/day: 2.0 packs/day for 50.0 years (100.0 ttl pk-yrs)    Types: Cigarettes   Smokeless tobacco: Never  Vaping Use   Vaping status: Every Day   Substances: Nicotine  Substance and Sexual Activity   Alcohol use: Yes    Comment: occasional   Drug use: No   Sexual activity: Yes    Birth control/protection: Post-menopausal  Other Topics Concern   Not on file  Social History Narrative   ** Merged History Encounter **       Social Determinants of Health   Financial Resource Strain: Not on file  Food Insecurity: No Food Insecurity (05/03/2023)   Hunger Vital Sign    Worried About Running Out of Food in the Last Year: Never true    Ran Out of Food in the Last Year: Never true  Transportation Needs: No Transportation Needs (05/03/2023)   PRAPARE - Administrator, Civil Service (Medical): No    Lack of Transportation (Non-Medical): No  Physical Activity: Not on file  Stress: Not on file  Social Connections: Not on file  Intimate Partner Violence: Not At Risk (04/27/2023)   Humiliation, Afraid, Rape, and Kick questionnaire    Fear of Current or Ex-Partner:  No    Emotionally Abused: No    Physically Abused: No    Sexually Abused: No     Current Outpatient Medications:    acetaminophen (TYLENOL) 325 MG tablet, Take 2 tablets (650 mg total) by mouth every 6 (six) hours as needed for mild pain (or Fever >/= 101)., Disp: 20 tablet, Rfl: 0   acidophilus (RISAQUAD) CAPS capsule, Take 1 capsule by mouth daily., Disp: , Rfl:    albuterol (VENTOLIN HFA) 108 (90 Base) MCG/ACT inhaler, Inhale 2 puffs into the lungs every 4 (four) hours as needed for wheezing or shortness of breath., Disp: 18 g, Rfl: 11   budesonide-formoterol (SYMBICORT) 160-4.5 MCG/ACT inhaler, Inhale  2 puffs into the lungs 2 (two) times daily., Disp: , Rfl:    busPIRone (BUSPAR) 30 MG tablet, Take 30 mg by mouth 2 (two) times daily., Disp: , Rfl:    cephALEXin (KEFLEX) 500 MG capsule, Take 1 capsule (500 mg total) by mouth every 6 (six) hours., Disp: 20 capsule, Rfl: 0   cloNIDine (CATAPRES) 0.2 MG tablet, Take 1 tablet (0.2 mg total) by mouth 2 (two) times daily., Disp: 60 tablet, Rfl: 0   diltiazem (CARDIZEM CD) 180 MG 24 hr capsule, Take 180 mg by mouth daily., Disp: , Rfl:    escitalopram (LEXAPRO) 20 MG tablet, Take 20 mg by mouth daily., Disp: , Rfl:    famotidine (PEPCID) 40 MG tablet, Take 40 mg by mouth at bedtime., Disp: , Rfl:    fenofibrate micronized (LOFIBRA) 134 MG capsule, Take 134 mg by mouth daily before breakfast. , Disp: , Rfl:    gabapentin (NEURONTIN) 800 MG tablet, Take 800 mg by mouth 3 (three) times daily. , Disp: , Rfl:    hydrALAZINE (APRESOLINE) 10 MG tablet, Take 1 tablet (10 mg total) by mouth 3 (three) times daily as needed. Please take if your blood pressure is above 150/90. You can repeat the dose as needed up with up to 3 pills per day. If your blood pressure is not improving with this, please call your doctor's office., Disp: 30 tablet, Rfl: 0   levETIRAcetam (KEPPRA) 500 MG tablet, Take 500 mg by mouth 2 (two) times daily., Disp: , Rfl:    levothyroxine (SYNTHROID, LEVOTHROID) 25 MCG tablet, Take 25 mcg by mouth daily before breakfast. , Disp: , Rfl:    olmesartan (BENICAR) 40 MG tablet, Take 1 tablet (40 mg total) by mouth daily., Disp: 30 tablet, Rfl: 11   ondansetron (ZOFRAN) 4 MG tablet, Take 1 tablet (4 mg total) by mouth every 8 (eight) hours as needed for nausea or vomiting., Disp: 20 tablet, Rfl: 0   OXYGEN, Inhale 3 L into the lungs at bedtime., Disp: , Rfl:    pantoprazole (PROTONIX) 40 MG tablet, Take 40 mg by mouth every morning. , Disp: , Rfl:    pilocarpine (SALAGEN) 5 MG tablet, , Disp: , Rfl:    pramipexole (MIRAPEX) 0.5 MG tablet, Take 0.5 mg  by mouth at bedtime., Disp: , Rfl:    SPIRIVA HANDIHALER 18 MCG inhalation capsule, 1 capsule daily., Disp: , Rfl:    tiZANidine (ZANAFLEX) 4 MG tablet, Take 1 tablet (4 mg total) by mouth every 6 (six) hours as needed for muscle spasms., Disp: 30 tablet, Rfl: 0   traZODone (DESYREL) 50 MG tablet, Take 0.5-1 tablets (25-50 mg total) by mouth at bedtime as needed for sleep., Disp: 30 tablet, Rfl: 3 No current facility-administered medications for this visit.  Facility-Administered Medications Ordered in Other  Visits:    chlorhexidine (HIBICLENS) 4 % liquid 4 application, 60 mL, Topical, Once, Duffy, Lucienne Minks, PA-C   chlorhexidine (HIBICLENS) 4 % liquid 4 application, 60 mL, Topical, Once, Duffy, Lucienne Minks, PA-C  Allergies  Allergen Reactions   Nsaids Other (See Comments)    Pt states it messes up her kidneys But takes aspirin & ibuprofen   Thiazide-Type Diuretics     Other reaction(s): Unknown     ROS  ***  Objective  There were no vitals filed for this visit.  There is no height or weight on file to calculate BMI.  Physical Exam ***  Recent Results (from the past 2160 hour(s))  Basic metabolic panel     Status: Abnormal   Collection Time: 04/26/23  5:30 PM  Result Value Ref Range   Sodium 134 (L) 135 - 145 mmol/L   Potassium 3.3 (L) 3.5 - 5.1 mmol/L   Chloride 87 (L) 98 - 111 mmol/L   CO2 34 (H) 22 - 32 mmol/L   Glucose, Bld 86 70 - 99 mg/dL    Comment: Glucose reference range applies only to samples taken after fasting for at least 8 hours.   BUN 12 8 - 23 mg/dL   Creatinine, Ser 1.61 (H) 0.44 - 1.00 mg/dL   Calcium 8.9 8.9 - 09.6 mg/dL   GFR, Estimated 31 (L) >60 mL/min    Comment: (NOTE) Calculated using the CKD-EPI Creatinine Equation (2021)    Anion gap 13 5 - 15    Comment: Performed at [facility], [address] [place], [place] [address]  CBC     Status: None   Collection Time: 04/26/23  5:30 PM  Result Value Ref Range   WBC 9.6 4.0 - 10.5 K/uL    RBC 4.08 3.87 - 5.11 MIL/uL   Hemoglobin 13.1 12.0 - 15.0 g/dL   HCT 98.1 19.1 - 47.8 %   MCV 100.0 80.0 - 100.0 fL   MCH 32.1 26.0 - 34.0 pg   MCHC 32.1 30.0 - 36.0 g/dL   RDW 29.5 62.1 - 30.8 %   Platelets 300 150 - 400 K/uL   nRBC 0.0 0.0 - 0.2 %    Comment: Performed at [facility], [address] [place], [place] [address]  Glucose, capillary     Status: None   Collection Time: 04/26/23 11:47 PM  Result Value Ref Range   Glucose-Capillary 96 70 - 99 mg/dL    Comment: Glucose reference range applies only to samples taken after fasting for at least 8 hours.  Aerobic Culture w Gram Stain (superficial specimen)     Status: None   Collection Time: 04/27/23  3:00 AM   Specimen: Wound  Result Value Ref Range   Specimen Description      WOUND LL Performed at [facility], [address], [place], [place] [address]    Special Requests      Normal Performed at [facility], [address] [place], [place] [address]    Gram Stain      FEW WBC PRESENT, PREDOMINANTLY PMN RARE GRAM POSITIVE COCCI Performed at [facility], [address] [place], [place] [address]    Culture MODERATE STAPHYLOCOCCUS AUREUS    Report Status 04/29/2023 FINAL    Organism ID, Bacteria STAPHYLOCOCCUS AUREUS       Susceptibility   Staphylococcus aureus - MIC*    CIPROFLOXACIN <=0.5 SENSITIVE Sensitive     ERYTHROMYCIN <=0.25 SENSITIVE Sensitive  GENTAMICIN <=0.5 SENSITIVE Sensitive     OXACILLIN 0.5 SENSITIVE Sensitive     TETRACYCLINE <=1 SENSITIVE Sensitive     VANCOMYCIN 1 SENSITIVE Sensitive     TRIMETH/SULFA <=10 SENSITIVE Sensitive     CLINDAMYCIN <=0.25 SENSITIVE Sensitive     RIFAMPIN <=0.5 SENSITIVE Sensitive     Inducible Clindamycin NEGATIVE Sensitive     LINEZOLID 1 SENSITIVE Sensitive     * MODERATE STAPHYLOCOCCUS AUREUS  Basic metabolic panel     Status: Abnormal   Collection Time: 04/27/23  5:22 AM  Result Value Ref Range   Sodium 137 135 -  145 mmol/L   Potassium 3.1 (L) 3.5 - 5.1 mmol/L   Chloride 93 (L) 98 - 111 mmol/L   CO2 34 (H) 22 - 32 mmol/L   Glucose, Bld 92 70 - 99 mg/dL    Comment: Glucose reference range applies only to samples taken after fasting for at least 8 hours.   BUN 11 8 - 23 mg/dL   Creatinine, Ser 1.61 (H) 0.44 - 1.00 mg/dL   Calcium 8.7 (L) 8.9 - 10.3 mg/dL   GFR, Estimated 39 (L) >60 mL/min    Comment: (NOTE) Calculated using the CKD-EPI Creatinine Equation (2021)    Anion gap 10 5 - 15    Comment: Performed at [facility], [address] [place], [place] [address]  CBC     Status: Abnormal   Collection Time: 04/27/23  5:22 AM  Result Value Ref Range   WBC 7.6 4.0 - 10.5 K/uL   RBC 3.76 (L) 3.87 - 5.11 MIL/uL   Hemoglobin 12.2 12.0 - 15.0 g/dL   HCT 54.0 98.1 - 19.1 %   MCV 98.1 80.0 - 100.0 fL   MCH 32.4 26.0 - 34.0 pg   MCHC 33.1 30.0 - 36.0 g/dL   RDW 47.8 29.5 - 62.1 %   Platelets 244 150 - 400 K/uL   nRBC 0.0 0.0 - 0.2 %    Comment: Performed at [facility], [address] [place], [place] [address]  Magnesium     Status: None   Collection Time: 04/27/23  5:22 AM  Result Value Ref Range   Magnesium 1.9 1.7 - 2.4 mg/dL    Comment: Performed at [facility], [address] [place], [place] [address]  Phosphorus     Status: None   Collection Time: 04/27/23  5:22 AM  Result Value Ref Range   Phosphorus 3.7 2.5 - 4.6 mg/dL    Comment: Performed at [facility], [address] [place], [place] [address]  Glucose, capillary     Status: Abnormal   Collection Time: 04/27/23  9:00 AM  Result Value Ref Range   Glucose-Capillary 100 (H) 70 - 99 mg/dL    Comment: Glucose reference range applies only to samples taken after fasting for at least 8 hours.  Glucose, capillary     Status: Abnormal   Collection Time: 04/27/23 12:13 PM  Result Value Ref Range   Glucose-Capillary 116 (H) 70 - 99 mg/dL    Comment: Glucose reference range applies only to  samples taken after fasting for at least 8 hours.  HIV Antibody (routine testing w rflx)     Status: None   Collection Time: 04/27/23  2:34 PM  Result Value Ref Range   HIV Screen 4th Generation wRfx Non Reactive Non Reactive    Comment: (NOTE) HIV-1/HIV-2 antibodies and HIV-1 p24 antigen were NOT detected. There is no laboratory evidence of  HIV infection. HIV Negative Performed At: [facility] [address] [place], [place] [id number] Jolene Schimke MD [id number]   RPR     Status: None   Collection Time: 04/27/23  2:34 PM  Result Value Ref Range   RPR Ser Ql NON REACTIVE NON REACTIVE    Comment: Performed at [facility], [address], [place], [place] [address]  Glucose, capillary     Status: Abnormal   Collection Time: 04/27/23  5:11 PM  Result Value Ref Range   Glucose-Capillary 111 (H) 70 - 99 mg/dL    Comment: Glucose reference range applies only to samples taken after fasting for at least 8 hours.  Glucose, capillary     Status: Abnormal   Collection Time: 04/27/23  9:31 PM  Result Value Ref Range   Glucose-Capillary 107 (H) 70 - 99 mg/dL    Comment: Glucose reference range applies only to samples taken after fasting for at least 8 hours.  CBC     Status: Abnormal   Collection Time: 04/28/23  5:16 AM  Result Value Ref Range   WBC 5.6 4.0 - 10.5 K/uL   RBC 3.69 (L) 3.87 - 5.11 MIL/uL   Hemoglobin 11.8 (L) 12.0 - 15.0 g/dL   HCT 32.4 40.1 - 02.7 %   MCV 98.9 80.0 - 100.0 fL   MCH 32.0 26.0 - 34.0 pg   MCHC 32.3 30.0 - 36.0 g/dL   RDW 25.3 66.4 - 40.3 %   Platelets 224 150 - 400 K/uL   nRBC 0.0 0.0 - 0.2 %    Comment: Performed at [facility], [address], [place], [place] [address]  Basic metabolic panel     Status: Abnormal   Collection Time: 04/28/23  5:16 AM  Result Value Ref Range   Sodium 136 135 - 145 mmol/L   Potassium 3.3 (L) 3.5 - 5.1 mmol/L   Chloride 94 (L) 98 - 111 mmol/L   CO2 33 (H) 22 - 32 mmol/L   Glucose, Bld  109 (H) 70 - 99 mg/dL    Comment: Glucose reference range applies only to samples taken after fasting for at least 8 hours.   BUN 14 8 - 23 mg/dL   Creatinine, Ser 9.56 (H) 0.44 - 1.00 mg/dL   Calcium 9.0 8.9 - 38.7 mg/dL   GFR, Estimated 46 (L) >60 mL/min    Comment: (NOTE) Calculated using the CKD-EPI Creatinine Equation (2021)    Anion gap 9 5 - 15    Comment: Performed at [facility], [address] [place], [place] [address]  Magnesium     Status: None   Collection Time: 04/28/23  5:16 AM  Result Value Ref Range   Magnesium 2.0 1.7 - 2.4 mg/dL    Comment: Performed at [facility], [address], [place], [place] [address]  Phosphorus     Status: None   Collection Time: 04/28/23  5:16 AM  Result Value Ref Range   Phosphorus 2.8 2.5 - 4.6 mg/dL    Comment: Performed at [facility], [address] [place], [place] [address]  Glucose, capillary     Status: Abnormal   Collection Time: 04/28/23  8:20 AM  Result Value Ref Range   Glucose-Capillary 113 (H) 70 - 99 mg/dL    Comment: Glucose reference range applies only to samples taken after fasting for at least 8 hours.  Glucose, capillary     Status: Abnormal   Collection Time: 04/28/23 11:52  AM  Result Value Ref Range   Glucose-Capillary 121 (H) 70 - 99 mg/dL    Comment: Glucose reference range applies only to samples taken after fasting for at least 8 hours.  Glucose, capillary     Status: Abnormal   Collection Time: 04/28/23  5:25 PM  Result Value Ref Range   Glucose-Capillary 102 (H) 70 - 99 mg/dL    Comment: Glucose reference range applies only to samples taken after fasting for at least 8 hours.  Glucose, capillary     Status: Abnormal   Collection Time: 04/28/23  9:47 PM  Result Value Ref Range   Glucose-Capillary 124 (H) 70 - 99 mg/dL    Comment: Glucose reference range applies only to samples taken after fasting for at least 8 hours.  CBC     Status: Abnormal   Collection Time:  04/29/23  4:38 AM  Result Value Ref Range   WBC 6.2 4.0 - 10.5 K/uL   RBC 3.82 (L) 3.87 - 5.11 MIL/uL   Hemoglobin 12.2 12.0 - 15.0 g/dL   HCT 56.2 13.0 - 86.5 %   MCV 100.0 80.0 - 100.0 fL   MCH 31.9 26.0 - 34.0 pg   MCHC 31.9 30.0 - 36.0 g/dL   RDW 78.4 69.6 - 29.5 %   Platelets 230 150 - 400 K/uL   nRBC 0.0 0.0 - 0.2 %    Comment: Performed at [facility], [address], [place], [place] [address]  Basic metabolic panel     Status: Abnormal   Collection Time: 04/29/23  4:38 AM  Result Value Ref Range   Sodium 138 135 - 145 mmol/L   Potassium 3.8 3.5 - 5.1 mmol/L   Chloride 98 98 - 111 mmol/L   CO2 33 (H) 22 - 32 mmol/L   Glucose, Bld 99 70 - 99 mg/dL    Comment: Glucose reference range applies only to samples taken after fasting for at least 8 hours.   BUN 12 8 - 23 mg/dL   Creatinine, Ser 2.44 (H) 0.44 - 1.00 mg/dL   Calcium 9.4 8.9 - 01.0 mg/dL   GFR, Estimated 58 (L) >60 mL/min    Comment: (NOTE) Calculated using the CKD-EPI Creatinine Equation (2021)    Anion gap 7 5 - 15    Comment: Performed at [facility], [address], [place], [place] [address]  Magnesium     Status: None   Collection Time: 04/29/23  4:38 AM  Result Value Ref Range   Magnesium 2.2 1.7 - 2.4 mg/dL    Comment: Performed at [facility], [address] [place], [place] [address]  Phosphorus     Status: None   Collection Time: 04/29/23  4:38 AM  Result Value Ref Range   Phosphorus 3.2 2.5 - 4.6 mg/dL    Comment: Performed at [facility], [address] [place], [place] [address]  Glucose, capillary     Status: Abnormal   Collection Time: 04/29/23  7:40 AM  Result Value Ref Range   Glucose-Capillary 115 (H) 70 - 99 mg/dL    Comment: Glucose reference range applies only to samples taken after fasting for at least 8 hours.  Chlamydia/NGC rt PCR ([facility] only)     Status: None   Collection Time: 04/29/23 10:30 AM   Specimen: Urine  Result Value Ref  Range   Specimen source GC/Chlam URINE, RANDOM    Chlamydia Tr NOT DETECTED NOT DETECTED   N gonorrhoeae NOT DETECTED NOT  DETECTED    Comment: (NOTE) This CT/NG assay has not been evaluated in patients with a history of  hysterectomy. Performed at [facility], [address] [place], [place] [address]   Glucose, capillary     Status: Abnormal   Collection Time: 04/29/23 11:39 AM  Result Value Ref Range   Glucose-Capillary 119 (H) 70 - 99 mg/dL    Comment: Glucose reference range applies only to samples taken after fasting for at least 8 hours.  Basic metabolic panel     Status: Abnormal   Collection Time: 05/26/23 12:14 PM  Result Value Ref Range   Sodium 126 (L) 135 - 145 mmol/L   Potassium 4.2 3.5 - 5.1 mmol/L   Chloride 90 (L) 98 - 111 mmol/L   CO2 25 22 - 32 mmol/L   Glucose, Bld 89 70 - 99 mg/dL    Comment: Glucose reference range applies only to samples taken after fasting for at least 8 hours.   BUN 15 8 - 23 mg/dL   Creatinine, Ser 3.22 (H) 0.44 - 1.00 mg/dL   Calcium 9.7 8.9 - 02.5 mg/dL   GFR, Estimated 49 (L) >60 mL/min    Comment: (NOTE) Calculated using the CKD-EPI Creatinine Equation (2021)    Anion gap 11 5 - 15    Comment: Performed at [facility], [address] [place], [place] [address]  CBC     Status: None   Collection Time: 05/26/23 12:14 PM  Result Value Ref Range   WBC 9.2 4.0 - 10.5 K/uL   RBC 4.52 3.87 - 5.11 MIL/uL   Hemoglobin 14.4 12.0 - 15.0 g/dL   HCT 23.7 62.8 - 31.5 %   MCV 93.6 80.0 - 100.0 fL   MCH 31.9 26.0 - 34.0 pg   MCHC 34.0 30.0 - 36.0 g/dL   RDW 17.6 16.0 - 73.7 %   Platelets 268 150 - 400 K/uL   nRBC 0.0 0.0 - 0.2 %    Comment: Performed at [facility], [address] [place], [place] [address]  Troponin I (High Sensitivity)     Status: None   Collection Time: 05/26/23 12:14 PM  Result Value Ref Range   Troponin I (High Sensitivity) 8 <18 ng/L    Comment: (NOTE) Elevated high sensitivity  troponin I (hsTnI) values and significant  changes across serial measurements may suggest ACS but many other  chronic and acute conditions are known to elevate hsTnI results.  Refer to the "Links" section for chest pain algorithms and additional  guidance. Performed at [facility], [address] [place], [place] [address]      Fall Risk:    05/24/2023    2:34 PM 03/25/2023    2:09 PM 02/22/2023    2:16 PM 02/11/2023    1:57 PM 01/04/2023    1:45 PM  Fall Risk   Falls in the past year? 0 0 0 0 1  Number falls in past yr:  0 0 0 0  Injury with Fall?  0 0 0 1  Risk for fall due to : No Fall Risks    Impaired balance/gait  Follow up Falls prevention discussed    Falls prevention discussed   ***  Functional Status Survey:   ***  Assessment & Plan  There are no diagnoses linked to this encounter.  -USPSTF grade A and B recommendations reviewed with patient; age-appropriate recommendations, preventive care, screening tests, etc discussed and encouraged; healthy living encouraged; see AVS for  patient education given to patient -Discussed importance of 150 minutes of physical activity weekly, eat two servings of fish weekly, eat one serving of tree nuts ( cashews, pistachios, pecans, almonds.Marland Kitchen) every other day, eat 6 servings of fruit/vegetables daily and drink plenty of water and avoid sweet beverages.   -Reviewed Health Maintenance: Yes.

## 2023-06-24 ENCOUNTER — Other Ambulatory Visit: Payer: Self-pay | Admitting: Internal Medicine

## 2023-06-24 ENCOUNTER — Telehealth: Payer: Self-pay | Admitting: Internal Medicine

## 2023-06-24 ENCOUNTER — Encounter: Payer: 59 | Admitting: Internal Medicine

## 2023-06-24 DIAGNOSIS — G44229 Chronic tension-type headache, not intractable: Secondary | ICD-10-CM

## 2023-06-24 MED ORDER — UBRELVY 100 MG PO TABS
100.0000 mg | ORAL_TABLET | Freq: Every day | ORAL | 1 refills | Status: DC
Start: 2023-06-24 — End: 2023-07-19

## 2023-06-24 NOTE — Telephone Encounter (Signed)
Patient called in c/o persistent headaches still, Fioricet not helping and has completed the course she was prescribed. Can you send her in something else please?  [facility] [address]

## 2023-06-27 ENCOUNTER — Encounter: Payer: 59 | Admitting: Internal Medicine

## 2023-06-28 ENCOUNTER — Other Ambulatory Visit: Payer: Self-pay | Admitting: Internal Medicine

## 2023-06-28 ENCOUNTER — Ambulatory Visit (INDEPENDENT_AMBULATORY_CARE_PROVIDER_SITE_OTHER): Payer: 59 | Admitting: Internal Medicine

## 2023-06-28 VITALS — BP 152/70 | HR 74 | Ht 66.0 in | Wt 182.0 lb

## 2023-06-28 DIAGNOSIS — F419 Anxiety disorder, unspecified: Secondary | ICD-10-CM | POA: Diagnosis not present

## 2023-06-28 DIAGNOSIS — I10 Essential (primary) hypertension: Secondary | ICD-10-CM

## 2023-06-28 DIAGNOSIS — F32A Depression, unspecified: Secondary | ICD-10-CM

## 2023-06-28 MED ORDER — MINOXIDIL 2.5 MG PO TABS
5.0000 mg | ORAL_TABLET | Freq: Every day | ORAL | 0 refills | Status: DC
Start: 2023-06-28 — End: 2023-07-19

## 2023-06-28 NOTE — Progress Notes (Signed)
Established Patient Office Visit  Subjective:  Patient ID: [name], female    DOB: [birth year]  Age: 67 y.o. MRN: [id number]  Chief Complaint  Patient presents with   Follow-up    Referral for Psychiatry    BP has improved but not fully controlled.    No other concerns at this time.   Past Medical History:  Diagnosis Date   (HFpEF) heart failure with preserved ejection fraction (HCC)    Acute renal failure (HCC) 03/29/2011   Acute respiratory distress 11/2020   Acute respiratory failure (HCC) 04/14/2014   Anxiety    Arthritis    CHF (congestive heart failure) (HCC)    Chronic respiratory failure with hypoxia and hypercapnia (HCC)    Community acquired pneumonia 01/10/2013   COPD (chronic obstructive pulmonary disease) (HCC)    no inhalers--smoker, no oxygen   Depression    Dyspnea    Emphysema lung (HCC)    GERD (gastroesophageal reflux disease)    Hyperlipidemia    Hypertension    Hypothyroid    Peripheral vascular disease (HCC)    Pre-diabetes    Restless leg syndrome    Seizures (HCC)    02/13/2011 -hx of seizure due to "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  no seizure since--pt does take keppra and office note from neurologist dr. Marjory Lies on this chart   Urinary incontinence     Past Surgical History:  Procedure Laterality Date   CARPAL TUNNEL RELEASE     left   COLONOSCOPY WITH PROPOFOL N/A 06/17/2022   Procedure: COLONOSCOPY WITH PROPOFOL;  Surgeon: Regis Bill, MD;  Location: [facility];  Service: Endoscopy;  Laterality: N/A;   JOINT REPLACEMENT  08/26/2011   right total knee arthroplasty, left total knee   KNEE ARTHROPLASTY  10/28/2011   Procedure: COMPUTER ASSISTED TOTAL KNEE ARTHROPLASTY;  Surgeon: Carlisle Beers Rendall III;  Location: [facility];  Service: Orthopedics;  Laterality: Left;  preop femoral nerve block   KNEE SURGERY     right    laminectomy and diskectomy     L4-5 with fusion   PROCTOSCOPY N/A 06/18/2022   Procedure: RIGID PROCTOSCOPY;  Surgeon: Karie Soda, MD;  Location: [facility];  Service: General;  Laterality: N/A;    Social History   Socioeconomic History   Marital status: Widowed    Spouse name: Not on file   Number of children: Not on file   Years of education: Not on file   Highest education level: Not on file  Occupational History   Not on file  Tobacco Use   Smoking status: Every Day    Current packs/day: 2.00    Average packs/day: 2.0 packs/day for 50.0 years (100.0 ttl pk-yrs)    Types: Cigarettes   Smokeless tobacco: Never  Vaping Use   Vaping status: Every Day   Substances: Nicotine  Substance and Sexual Activity   Alcohol use: Yes    Comment: occasional   Drug use: No   Sexual activity: Yes    Birth control/protection: Post-menopausal  Other Topics Concern   Not on file  Social History Narrative   ** Merged History Encounter **       Social Determinants of Health   Financial Resource Strain: Not on file  Food Insecurity: No Food Insecurity (05/03/2023)   Hunger Vital Sign    Worried About Running Out of Food in the Last  Year: Never true    Ran Out of Food in the Last Year: Never true  Transportation Needs: No Transportation Needs (05/03/2023)   PRAPARE - Administrator, Civil Service (Medical): No    Lack of Transportation (Non-Medical): No  Physical Activity: Not on file  Stress: Not on file  Social Connections: Not on file  Intimate Partner Violence: Not At Risk (04/27/2023)   Humiliation, Afraid, Rape, and Kick questionnaire    Fear of Current or Ex-Partner: No    Emotionally Abused: No    Physically Abused: No    Sexually Abused: No    Family History  Problem Relation Age of Onset   Heart attack Father 30   Alzheimer'Tiara Maultsby disease Mother    Breast cancer Neg Hx     Allergies  Allergen Reactions   Nsaids Other (See Comments)    Pt states it messes up  her kidneys But takes aspirin & ibuprofen   Thiazide-Type Diuretics     Other reaction(Atticus Lemberger): Unknown    Review of Systems  Constitutional: Negative.   HENT: Negative.    Eyes: Negative.   Respiratory:  Positive for shortness of breath (on exertion).   Cardiovascular: Negative.   Gastrointestinal: Negative.   Genitourinary: Negative.   Skin: Negative.   Neurological:  Positive for headaches.  Endo/Heme/Allergies: Negative.        Objective:   BP (!) 152/70   Pulse 74   Ht 5\' 6"  (1.676 m)   Wt 182 lb (82.6 kg)   SpO2 90%   BMI 29.38 kg/m   Vitals:   06/28/23 1345  BP: (!) 152/70  Pulse: 74  Height: 5\' 6"  (1.676 m)  Weight: 182 lb (82.6 kg)  SpO2: 90%  BMI (Calculated): 29.39    Physical Exam Vitals reviewed.  Constitutional:      General: She is not in acute distress. HENT:     Head: Normocephalic.     Nose: Nose normal.     Mouth/Throat:     Mouth: Mucous membranes are moist.  Eyes:     Extraocular Movements: Extraocular movements intact.     Pupils: Pupils are equal, round, and reactive to light.  Cardiovascular:     Rate and Rhythm: Normal rate and regular rhythm.     Heart sounds: No murmur heard. Pulmonary:     Effort: Pulmonary effort is normal.     Breath sounds: Decreased air movement present. No rhonchi or rales.  Abdominal:     General: Abdomen is flat.     Palpations: There is no hepatomegaly, splenomegaly or mass.  Musculoskeletal:        General: Normal range of motion.     Cervical back: Normal range of motion. No tenderness.  Skin:    General: Skin is warm and dry.  Neurological:     General: No focal deficit present.     Mental Status: She is alert and oriented to person, place, and time.     Cranial Nerves: No cranial nerve deficit.     Motor: No weakness.  Psychiatric:        Mood and Affect: Mood normal.        Behavior: Behavior normal.      No results found for any visits on 06/28/23.      Assessment & Plan:  As per  problem list, add minoxidil as reportedly allergic to hydrochlorothiazide.  Problem List Items Addressed This Visit       Cardiovascular and Mediastinum  Hypertension - Primary    Return in about 3 weeks (around 07/19/2023) for BP followup.   Total time spent: 20 minutes  Luna Fuse, MD  06/28/2023   This document may have been prepared by [facility] Voice Recognition software and as such may include unintentional dictation errors.

## 2023-07-01 NOTE — Telephone Encounter (Signed)
Seen 06/28/23

## 2023-07-04 ENCOUNTER — Telehealth: Payer: Self-pay | Admitting: Internal Medicine

## 2023-07-04 NOTE — Telephone Encounter (Signed)
Patient left VM wanting to know when her next appt is. Please call patient and inform her.

## 2023-07-11 ENCOUNTER — Ambulatory Visit (INDEPENDENT_AMBULATORY_CARE_PROVIDER_SITE_OTHER): Payer: 59 | Admitting: Urology

## 2023-07-11 ENCOUNTER — Encounter: Payer: Self-pay | Admitting: Urology

## 2023-07-11 VITALS — BP 138/84 | HR 97 | Wt 186.4 lb

## 2023-07-11 DIAGNOSIS — N8110 Cystocele, unspecified: Secondary | ICD-10-CM

## 2023-07-11 DIAGNOSIS — N3942 Incontinence without sensory awareness: Secondary | ICD-10-CM | POA: Diagnosis not present

## 2023-07-11 DIAGNOSIS — N3946 Mixed incontinence: Secondary | ICD-10-CM | POA: Diagnosis not present

## 2023-07-11 LAB — URINALYSIS, COMPLETE
Bilirubin, UA: NEGATIVE
Glucose, UA: NEGATIVE
Ketones, UA: NEGATIVE
Nitrite, UA: NEGATIVE
Specific Gravity, UA: 1.01 (ref 1.005–1.030)
Urobilinogen, Ur: 0.2 mg/dL (ref 0.2–1.0)
pH, UA: 5.5 (ref 5.0–7.5)

## 2023-07-11 LAB — MICROSCOPIC EXAMINATION
Epithelial Cells (non renal): >10 /hpf — AB (ref 0–10)
WBC, UA: >30 /hpf — AB (ref 0–5)

## 2023-07-11 LAB — BLADDER SCAN AMB NON-IMAGING

## 2023-07-11 NOTE — Patient Instructions (Signed)

## 2023-07-11 NOTE — Addendum Note (Signed)
Addended by: Sueanne Margarita on: 07/11/2023 11:01 AM   Modules accepted: Orders

## 2023-07-11 NOTE — Progress Notes (Signed)
07/11/2023 10:19 AM   [name] 11-10-1956 [id number]  Referring provider: Margarita Mail, DO [address] [place],  [place] [address]  Chief Complaint  Patient presents with   Urinary Incontinence   Follow-up    HPI: ST: Patient followed by Dr. Ashley Royalty at [facility] in 2021 for urge incontinence previously on Vesicare and Myrbetriq and residual 156 mL.  Never had urodynamics.  When he saw her in 2023 residual after double voiding was 280 mL.  Had rectal surgery in August and was given Gemtesa.  Today Patient leaks with coughing sneezing but not bending lifting.  She has urge incontinence.  Both are significant.  She has moderately severe bedwetting.  She wears 5 pads a day and the severity of the leakage varies  She voids with a good flow and feels empty.  No hysterectomy.  She has had low back surgery   No history of bladder surgery kidney stones or urinary tract infections.  Currently not on medication.  On pelvic examination patient had a moderate grade 2 cystocele with a mild central defect and more of a trapdoor effect.  She did not have any hinging effect.  The cervix distended from 8 or 9 cm to approximately 6 cm.  Minimal rectocele.  Grade 2 hypermobility the bladder neck and negative cough test.  Bladder scan residual was 220 mL   PMH: Past Medical History:  Diagnosis Date   (HFpEF) heart failure with preserved ejection fraction (HCC)    Acute renal failure (HCC) 03/29/2011   Acute respiratory distress 11/2020   Acute respiratory failure (HCC) 04/14/2014   Anxiety    Arthritis    CHF (congestive heart failure) (HCC)    Chronic respiratory failure with hypoxia and hypercapnia (HCC)    Community acquired pneumonia 01/10/2013   COPD (chronic obstructive pulmonary disease) (HCC)    no inhalers--smoker, no oxygen   Depression    Dyspnea    Emphysema lung (HCC)    GERD (gastroesophageal reflux disease)    Hyperlipidemia    Hypertension     Hypothyroid    Peripheral vascular disease (HCC)    Pre-diabetes    Restless leg syndrome    Seizures (HCC)    02/13/2011 -hx of seizure due to "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  no seizure since--pt does take keppra and office note from neurologist dr. Marjory Lies on this chart   Urinary incontinence     Surgical History: Past Surgical History:  Procedure Laterality Date   CARPAL TUNNEL RELEASE     left   COLONOSCOPY WITH PROPOFOL N/A 06/17/2022   Procedure: COLONOSCOPY WITH PROPOFOL;  Surgeon: Regis Bill, MD;  Location: [facility];  Service: Endoscopy;  Laterality: N/A;   JOINT REPLACEMENT  08/26/2011   right total knee arthroplasty, left total knee   KNEE ARTHROPLASTY  10/28/2011   Procedure: COMPUTER ASSISTED TOTAL KNEE ARTHROPLASTY;  Surgeon: Carlisle Beers Rendall III;  Location: [facility];  Service: Orthopedics;  Laterality: Left;  preop femoral nerve block   KNEE SURGERY     right   laminectomy and diskectomy     L4-5 with fusion   PROCTOSCOPY N/A 06/18/2022   Procedure: RIGID PROCTOSCOPY;  Surgeon: Karie Soda, MD;  Location: [facility];  Service: General;  Laterality: N/A;    Home Medications:  Allergies as of 07/11/2023       Reactions  Nsaids Other (See Comments)   Pt states it messes up her kidneys But takes aspirin & ibuprofen   Thiazide-type Diuretics    Other reaction(s): Unknown        Medication List        Accurate as of July 11, 2023 10:19 AM. If you have any questions, ask your nurse or doctor.          STOP taking these medications    cephALEXin 500 MG capsule Commonly known as: KEFLEX Stopped by: Lorin Picket A Conception Doebler       TAKE these medications    acetaminophen 325 MG tablet Commonly known as: TYLENOL Take 2 tablets (650 mg total) by mouth every 6 (six) hours as needed for mild pain (or Fever >/= 101).   acidophilus Caps  capsule Take 1 capsule by mouth daily.   albuterol 108 (90 Base) MCG/ACT inhaler Commonly known as: VENTOLIN HFA Inhale 2 puffs into the lungs every 4 (four) hours as needed for wheezing or shortness of breath.   budesonide-formoterol 160-4.5 MCG/ACT inhaler Commonly known as: SYMBICORT Inhale 2 puffs into the lungs 2 (two) times daily.   busPIRone 30 MG tablet Commonly known as: BUSPAR Take 30 mg by mouth 2 (two) times daily.   cloNIDine 0.2 MG tablet Commonly known as: Catapres Take 1 tablet (0.2 mg total) by mouth 2 (two) times daily.   diltiazem 180 MG 24 hr capsule Commonly known as: CARDIZEM CD Take 180 mg by mouth daily.   escitalopram 20 MG tablet Commonly known as: LEXAPRO Take 20 mg by mouth daily.   famotidine 40 MG tablet Commonly known as: PEPCID Take 40 mg by mouth at bedtime.   fenofibrate micronized 134 MG capsule Commonly known as: LOFIBRA Take 134 mg by mouth daily before breakfast.   gabapentin 800 MG tablet Commonly known as: NEURONTIN Take 800 mg by mouth 3 (three) times daily.   hydrALAZINE 10 MG tablet Commonly known as: APRESOLINE Take 1 tablet (10 mg total) by mouth 3 (three) times daily as needed. Please take if your blood pressure is above 150/90. You can repeat the dose as needed up with up to 3 pills per day. If your blood pressure is not improving with this, please call your doctor's office.   levETIRAcetam 500 MG tablet Commonly known as: KEPPRA Take 500 mg by mouth 2 (two) times daily.   levothyroxine 25 MCG tablet Commonly known as: SYNTHROID Take 25 mcg by mouth daily before breakfast.   minoxidil 2.5 MG tablet Commonly known as: LONITEN Take 2 tablets (5 mg total) by mouth daily.   olmesartan 40 MG tablet Commonly known as: BENICAR Take 1 tablet (40 mg total) by mouth daily.   ondansetron 4 MG tablet Commonly known as: Zofran Take 1 tablet (4 mg total) by mouth every 8 (eight) hours as needed for nausea or vomiting.    OXYGEN Inhale 3 L into the lungs at bedtime.   pantoprazole 40 MG tablet Commonly known as: PROTONIX Take 40 mg by mouth every morning.   pilocarpine 5 MG tablet Commonly known as: SALAGEN   pramipexole 0.5 MG tablet Commonly known as: MIRAPEX Take 0.5 mg by mouth at bedtime.   Spiriva HandiHaler 18 MCG inhalation capsule Generic drug: tiotropium 1 capsule daily.   tiZANidine 4 MG tablet Commonly known as: Zanaflex Take 1 tablet (4 mg total) by mouth every 6 (six) hours as needed for muscle spasms.   traZODone 50 MG tablet Commonly known as: DESYREL Take 0.5-1  tablets (25-50 mg total) by mouth at bedtime as needed for sleep.   Ubrelvy 100 MG Tabs Generic drug: Ubrogepant Take 1 tablet (100 mg total) by mouth daily. as needed for headache. Max 200 mg/day        Allergies:  Allergies  Allergen Reactions   Nsaids Other (See Comments)    Pt states it messes up her kidneys But takes aspirin & ibuprofen   Thiazide-Type Diuretics     Other reaction(s): Unknown    Family History: Family History  Problem Relation Age of Onset   Heart attack Father 56   Alzheimer's disease Mother    Breast cancer Neg Hx     Social History:  reports that she has been smoking cigarettes. She has a 100 pack-year smoking history. She has never used smokeless tobacco. She reports current alcohol use. She reports that she does not use drugs.  ROS:                                        Physical Exam: BP 138/84   Pulse 97   Wt 84.6 kg   BMI 30.09 kg/m   Constitutional:  Alert and oriented, No acute distress.   Laboratory Data: Lab Results  Component Value Date   WBC 9.2 05/26/2023   HGB 14.4 05/26/2023   HCT 42.3 05/26/2023   MCV 93.6 05/26/2023   PLT 268 05/26/2023    Lab Results  Component Value Date   CREATININE 1.21 (H) 05/26/2023    No results found for: "PSA"  No results found for: "TESTOSTERONE"  Lab Results  Component Value Date    HGBA1C 5.6 02/22/2023    Urinalysis    Component Value Date/Time   COLORURINE YELLOW (A) 07/06/2022 2010   APPEARANCEUR CLOUDY (A) 07/06/2022 2010   APPEARANCEUR Clear 05/27/2022 1402   LABSPEC 1.015 07/06/2022 2010   PHURINE 6.0 07/06/2022 2010   GLUCOSEU NEGATIVE 07/06/2022 2010   HGBUR MODERATE (A) 07/06/2022 2010   BILIRUBINUR neg 02/22/2023 1425   BILIRUBINUR Negative 05/27/2022 1402   KETONESUR NEGATIVE 07/06/2022 2010   PROTEINUR Negative 02/22/2023 1425   PROTEINUR 100 (A) 07/06/2022 2010   UROBILINOGEN 0.2 02/22/2023 1425   UROBILINOGEN 0.2 04/14/2014 1217   NITRITE positive 02/22/2023 1425   NITRITE POSITIVE (A) 07/06/2022 2010   LEUKOCYTESUR Large (3+) (A) 02/22/2023 1425   LEUKOCYTESUR LARGE (A) 07/06/2022 2010    Pertinent Imaging: Urine reviewed.  Chart reviewed.  Urine sent for culture  Assessment & Plan: Patient has mixed incontinence and incomplete bladder emptying without risk factors.  Role of urodynamics and cystoscopy discussed.  Prolapse likely is affecting some of her efficiency of emptying but in my opinion is likely not the sole factor without a hinging effect or large central defect.  We will check her bladder emptying with and without prolapse reduced.  It would be hard to justify a hysterectomy and prolapse.  That is asymptomatic.  A trial of a pessary is a distant option.  She has mixed incontinence patient with an elevated residual  1. Mixed stress and urge urinary incontinence  - CULTURE, URINE COMPREHENSIVE - Urinalysis, Complete  2. Urinary incontinence without sensory awareness  - CULTURE, URINE COMPREHENSIVE - Urinalysis, Complete   No follow-ups on file.  Martina Sinner, MD  [facility] [address] [place], [place] [address] [phone]

## 2023-07-14 ENCOUNTER — Other Ambulatory Visit (INDEPENDENT_AMBULATORY_CARE_PROVIDER_SITE_OTHER): Payer: Self-pay | Admitting: Nurse Practitioner

## 2023-07-14 DIAGNOSIS — M542 Cervicalgia: Secondary | ICD-10-CM

## 2023-07-14 DIAGNOSIS — I10 Essential (primary) hypertension: Secondary | ICD-10-CM

## 2023-07-15 ENCOUNTER — Ambulatory Visit (INDEPENDENT_AMBULATORY_CARE_PROVIDER_SITE_OTHER): Payer: 59 | Admitting: Nurse Practitioner

## 2023-07-15 ENCOUNTER — Encounter (INDEPENDENT_AMBULATORY_CARE_PROVIDER_SITE_OTHER): Payer: Self-pay | Admitting: Nurse Practitioner

## 2023-07-15 ENCOUNTER — Ambulatory Visit (INDEPENDENT_AMBULATORY_CARE_PROVIDER_SITE_OTHER): Payer: 59

## 2023-07-15 DIAGNOSIS — N1832 Chronic kidney disease, stage 3b: Secondary | ICD-10-CM

## 2023-07-15 DIAGNOSIS — I83892 Varicose veins of left lower extremities with other complications: Secondary | ICD-10-CM

## 2023-07-15 DIAGNOSIS — I10 Essential (primary) hypertension: Secondary | ICD-10-CM

## 2023-07-15 DIAGNOSIS — Z720 Tobacco use: Secondary | ICD-10-CM

## 2023-07-15 DIAGNOSIS — M542 Cervicalgia: Secondary | ICD-10-CM

## 2023-07-17 ENCOUNTER — Encounter (INDEPENDENT_AMBULATORY_CARE_PROVIDER_SITE_OTHER): Payer: Self-pay | Admitting: Nurse Practitioner

## 2023-07-17 NOTE — Progress Notes (Signed)
Subjective:    Patient ID: [name], female    DOB: [birth year], 67 y.o.   MRN: [id number] Chief Complaint  Patient presents with   Follow-up    pt conv follow up with carotid & renal    [name] is a 67 year old female who returns today for evaluation of neck pain as well as evaluating for possible renal artery stenosis.  She was previously seen for issues with varicosities and we discussed sclerotherapy we are currently awaiting insurance approval and scheduling.  The patient had good pain and was very concerned that she may have carotid stenosis.  She denies any TIA or CVA-like symptoms.  No amaurosis fugax.  Additionally she has been struggling significantly with hypertension.  She notes it has been difficult to control despite multiple blood pressure medications.  Today noninvasive study showed no evidence of dissection, stenosis or occlusion of her bilateral internal carotid arteries.  Normal flow hemodynamics in the bilateral subclavian arteries.  No normal flow dynamics in the bilateral subclavian arteries.  Today noninvasive studies show no evidence of significant renal artery stenosis.  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  Today the ultrasound was unable to detect evidence of a left kidney.      Review of Systems  Cardiovascular:  Positive for leg swelling.  Musculoskeletal:  Positive for neck pain.  Skin:  Positive for color change.  All other systems reviewed and are negative.      Objective:   Physical Exam Vitals reviewed.  HENT:     Head: Normocephalic.  Neck:     Vascular: No carotid bruit.  Cardiovascular:     Rate and Rhythm: Normal rate.  Pulmonary:     Effort: Pulmonary effort is normal.  Musculoskeletal:        General: Tenderness present.     Left lower leg: Edema present.  Skin:    General: Skin is warm and dry.  Neurological:     Mental Status: She is alert and oriented to person,  place, and time.     Gait: Gait abnormal.  Psychiatric:        Mood and Affect: Mood normal.        Behavior: Behavior normal.        Thought Content: Thought content normal.        Judgment: Judgment normal.     BP 124/78 (BP Location: Left Arm)   Pulse 69   Resp 18   Ht 5\' 6"  (1.676 m)   Wt 187 lb (84.8 kg)   BMI 30.18 kg/m   Past Medical History:  Diagnosis Date   (HFpEF) heart failure with preserved ejection fraction (HCC)    Acute renal failure (HCC) 03/29/2011   Acute respiratory distress 11/2020   Acute respiratory failure (HCC) 04/14/2014   Anxiety    Arthritis    CHF (congestive heart failure) (HCC)    Chronic respiratory failure with hypoxia and hypercapnia (HCC)    Community acquired pneumonia 01/10/2013   COPD (chronic obstructive pulmonary disease) (HCC)    no inhalers--smoker, no oxygen   Depression    Dyspnea    Emphysema lung (HCC)    GERD (gastroesophageal reflux disease)    Hyperlipidemia    Hypertension    Hypothyroid    Peripheral vascular disease (HCC)    Pre-diabetes    Restless leg syndrome    Seizures (HCC)    02/13/2011 -hx of seizure due to "hypertensive encephalopathy  in setting of narcotic withdrawal" --pt had run out of her pain medicine she was taking for her knee and back pain.  no seizure since--pt does take keppra and office note from neurologist dr. Marjory Lies on this chart   Urinary incontinence     Social History   Socioeconomic History   Marital status: Widowed    Spouse name: Not on file   Number of children: Not on file   Years of education: Not on file   Highest education level: Not on file  Occupational History   Not on file  Tobacco Use   Smoking status: Every Day    Current packs/day: 2.00    Average packs/day: 2.0 packs/day for 50.0 years (100.0 ttl pk-yrs)    Types: Cigarettes   Smokeless tobacco: Never  Vaping Use   Vaping status: Every Day   Substances: Nicotine  Substance and Sexual Activity   Alcohol use: Yes     Comment: occasional   Drug use: No   Sexual activity: Yes    Birth control/protection: Post-menopausal  Other Topics Concern   Not on file  Social History Narrative   ** Merged History Encounter **       Social Determinants of Health   Financial Resource Strain: Not on file  Food Insecurity: No Food Insecurity (05/03/2023)   Hunger Vital Sign    Worried About Running Out of Food in the Last Year: Never true    Ran Out of Food in the Last Year: Never true  Transportation Needs: No Transportation Needs (05/03/2023)   PRAPARE - Administrator, Civil Service (Medical): No    Lack of Transportation (Non-Medical): No  Physical Activity: Not on file  Stress: Not on file  Social Connections: Not on file  Intimate Partner Violence: Not At Risk (04/27/2023)   Humiliation, Afraid, Rape, and Kick questionnaire    Fear of Current or Ex-Partner: No    Emotionally Abused: No    Physically Abused: No    Sexually Abused: No    Past Surgical History:  Procedure Laterality Date   CARPAL TUNNEL RELEASE     left   COLONOSCOPY WITH PROPOFOL N/A 06/17/2022   Procedure: COLONOSCOPY WITH PROPOFOL;  Surgeon: Regis Bill, MD;  Location: [facility];  Service: Endoscopy;  Laterality: N/A;   JOINT REPLACEMENT  08/26/2011   right total knee arthroplasty, left total knee   KNEE ARTHROPLASTY  10/28/2011   Procedure: COMPUTER ASSISTED TOTAL KNEE ARTHROPLASTY;  Surgeon: Carlisle Beers Rendall III;  Location: [facility];  Service: Orthopedics;  Laterality: Left;  preop femoral nerve block   KNEE SURGERY     right   laminectomy and diskectomy     L4-5 with fusion   PROCTOSCOPY N/A 06/18/2022   Procedure: RIGID PROCTOSCOPY;  Surgeon: Karie Soda, MD;  Location: [facility];  Service: General;  Laterality: N/A;    Family History  Problem Relation Age of Onset   Heart attack Father 6   Alzheimer's disease Mother    Breast cancer Neg Hx     Allergies  Allergen Reactions   Nsaids Other (See  Comments)    Pt states it messes up her kidneys But takes aspirin & ibuprofen   Thiazide-Type Diuretics     Other reaction(s): Unknown       Latest Ref Rng & Units 05/26/2023   12:14 PM 04/29/2023    4:38 AM 04/28/2023    5:16 AM  CBC  WBC 4.0 - 10.5 K/uL 9.2  6.2  5.6   Hemoglobin 12.0 - 15.0 g/dL 04.5  40.9  81.1   Hematocrit 36.0 - 46.0 % 42.3  38.2  36.5   Platelets 150 - 400 K/uL 268  230  224       CMP     Component Value Date/Time   NA 126 (L) 05/26/2023 1214   NA 133 (L) 12/24/2020 1117   K 4.2 05/26/2023 1214   CL 90 (L) 05/26/2023 1214   CO2 25 05/26/2023 1214   GLUCOSE 89 05/26/2023 1214   BUN 15 05/26/2023 1214   BUN 12 12/24/2020 1117   CREATININE 1.21 (H) 05/26/2023 1214   CREATININE 1.25 (H) 02/22/2023 1447   CALCIUM 9.7 05/26/2023 1214   PROT 6.9 03/12/2023 2342   ALBUMIN 3.8 03/12/2023 2342   AST 33 03/12/2023 2342   ALT 27 03/12/2023 2342   ALKPHOS 20 (L) 03/12/2023 2342   BILITOT 0.9 03/12/2023 2342   GFR 37.03 (L) 03/29/2011 1540   EGFR 48 (L) 02/22/2023 1447   GFRNONAA 49 (L) 05/26/2023 1214     No results found.     Assessment & Plan:   1. Varicose veins of both lower extremities with inflammation As previously discussed with the patient and her last visit, we are still working on Therapist, occupational and authorization to move forward with sclerotherapy    2. Primary hypertension Today noninvasive studies show no evidence of significant renal artery disease however an incidental finding is that the patient only has a solitary right kidney.  This has not been noted in previous records.  Will refer the patient to nephrology as noted below.  3. Tobacco abuse Smoking cessation was discussed, 3-10 minutes spent on this topic specifically  4. Neck pain Today the patient's noninvasive studies show no evidence of stenosis, dissection or occlusion.  This can be reevaluated as needed.   5. Stage 3b chronic kidney disease (HCC) Given the  patient's stage III chronic kidney disease and the new evidence that she has a solitary kidney, I feel it is prudent to refer the patient to nephrology for further treatment, evaluation and workup is necessary. - Ambulatory referral to Nephrology   Current Outpatient Medications on File Prior to Visit  Medication Sig Dispense Refill   acetaminophen (TYLENOL) 325 MG tablet Take 2 tablets (650 mg total) by mouth every 6 (six) hours as needed for mild pain (or Fever >/= 101). 20 tablet 0   acidophilus (RISAQUAD) CAPS capsule Take 1 capsule by mouth daily.     albuterol (VENTOLIN HFA) 108 (90 Base) MCG/ACT inhaler Inhale 2 puffs into the lungs every 4 (four) hours as needed for wheezing or shortness of breath. 18 g 11   budesonide-formoterol (SYMBICORT) 160-4.5 MCG/ACT inhaler Inhale 2 puffs into the lungs 2 (two) times daily.     busPIRone (BUSPAR) 30 MG tablet Take 30 mg by mouth 2 (two) times daily.     cloNIDine (CATAPRES) 0.2 MG tablet Take 1 tablet (0.2 mg total) by mouth 2 (two) times daily. 60 tablet 0   diltiazem (CARDIZEM CD) 180 MG 24 hr capsule Take 180 mg by mouth daily.     escitalopram (LEXAPRO) 20 MG tablet Take 20 mg by mouth daily.     famotidine (PEPCID) 40 MG tablet Take 40 mg by mouth at bedtime.     fenofibrate micronized (LOFIBRA) 134 MG capsule Take 134 mg by mouth daily before breakfast.      gabapentin (NEURONTIN) 800 MG tablet Take 800 mg  by mouth 3 (three) times daily.      hydrALAZINE (APRESOLINE) 10 MG tablet Take 1 tablet (10 mg total) by mouth 3 (three) times daily as needed. Please take if your blood pressure is above 150/90. You can repeat the dose as needed up with up to 3 pills per day. If your blood pressure is not improving with this, please call your doctor's office. 30 tablet 0   levETIRAcetam (KEPPRA) 500 MG tablet Take 500 mg by mouth 2 (two) times daily.     levothyroxine (SYNTHROID, LEVOTHROID) 25 MCG tablet Take 25 mcg by mouth daily before breakfast.       minoxidil (LONITEN) 2.5 MG tablet Take 2 tablets (5 mg total) by mouth daily. 60 tablet 0   olmesartan (BENICAR) 40 MG tablet Take 1 tablet (40 mg total) by mouth daily. 30 tablet 11   ondansetron (ZOFRAN) 4 MG tablet Take 1 tablet (4 mg total) by mouth every 8 (eight) hours as needed for nausea or vomiting. 20 tablet 0   OXYGEN Inhale 3 L into the lungs at bedtime.     pantoprazole (PROTONIX) 40 MG tablet Take 40 mg by mouth every morning.      pilocarpine (SALAGEN) 5 MG tablet      pramipexole (MIRAPEX) 0.5 MG tablet Take 0.5 mg by mouth at bedtime.     SPIRIVA HANDIHALER 18 MCG inhalation capsule 1 capsule daily.     tiZANidine (ZANAFLEX) 4 MG tablet Take 1 tablet (4 mg total) by mouth every 6 (six) hours as needed for muscle spasms. 30 tablet 0   traZODone (DESYREL) 50 MG tablet Take 0.5-1 tablets (25-50 mg total) by mouth at bedtime as needed for sleep. 30 tablet 3   Ubrogepant (UBRELVY) 100 MG TABS Take 1 tablet (100 mg total) by mouth daily. as needed for headache. Max 200 mg/day 30 tablet 1   Current Facility-Administered Medications on File Prior to Visit  Medication Dose Route Frequency Provider Last Rate Last Admin   chlorhexidine (HIBICLENS) 4 % liquid 4 application  60 mL Topical Once Duffy, Lucienne Minks, PA-C       chlorhexidine (HIBICLENS) 4 % liquid 4 application  60 mL Topical Once Duffy, Lucienne Minks, PA-C        There are no Patient Instructions on file for this visit. No follow-ups on file.   Georgiana Spinner, NP

## 2023-07-18 ENCOUNTER — Ambulatory Visit: Payer: 59 | Admitting: Urology

## 2023-07-19 ENCOUNTER — Ambulatory Visit: Payer: 59 | Admitting: Internal Medicine

## 2023-07-19 VITALS — BP 138/78 | HR 86 | Ht 66.0 in | Wt 185.0 lb

## 2023-07-19 DIAGNOSIS — E1142 Type 2 diabetes mellitus with diabetic polyneuropathy: Secondary | ICD-10-CM

## 2023-07-19 DIAGNOSIS — K219 Gastro-esophageal reflux disease without esophagitis: Secondary | ICD-10-CM | POA: Diagnosis not present

## 2023-07-19 DIAGNOSIS — E039 Hypothyroidism, unspecified: Secondary | ICD-10-CM | POA: Diagnosis not present

## 2023-07-19 DIAGNOSIS — I10 Essential (primary) hypertension: Secondary | ICD-10-CM

## 2023-07-19 MED ORDER — CLONIDINE HCL 0.2 MG PO TABS
0.2000 mg | ORAL_TABLET | Freq: Two times a day (BID) | ORAL | 0 refills | Status: DC
Start: 2023-07-19 — End: 2023-10-21

## 2023-07-19 MED ORDER — MINOXIDIL 2.5 MG PO TABS
5.0000 mg | ORAL_TABLET | Freq: Every day | ORAL | 0 refills | Status: DC
Start: 2023-07-19 — End: 2023-10-21

## 2023-07-19 MED ORDER — OLMESARTAN MEDOXOMIL 40 MG PO TABS
40.0000 mg | ORAL_TABLET | Freq: Every day | ORAL | 0 refills | Status: AC
Start: 2023-07-19 — End: 2023-10-17

## 2023-07-19 MED ORDER — PANTOPRAZOLE SODIUM 40 MG PO TBEC
40.0000 mg | DELAYED_RELEASE_TABLET | Freq: Every morning | ORAL | 2 refills | Status: DC
Start: 2023-07-19 — End: 2023-10-21

## 2023-07-19 NOTE — Progress Notes (Signed)
Established Patient Office Visit  Subjective:  Patient ID: [name], female    DOB: July 03, 1956  Age: 67 y.o. MRN: [id number]  Chief Complaint  Patient presents with   Follow-up    3 weeks BP Follow Up    Headaches have resolve and BP now fully controlled.    No other concerns at this time.   Past Medical History:  Diagnosis Date   (HFpEF) heart failure with preserved ejection fraction (HCC)    Acute renal failure (HCC) 03/29/2011   Acute respiratory distress 11/2020   Acute respiratory failure (HCC) 04/14/2014   Anxiety    Arthritis    CHF (congestive heart failure) (HCC)    Chronic respiratory failure with hypoxia and hypercapnia (HCC)    Community acquired pneumonia 01/10/2013   COPD (chronic obstructive pulmonary disease) (HCC)    no inhalers--smoker, no oxygen   Depression    Dyspnea    Emphysema lung (HCC)    GERD (gastroesophageal reflux disease)    Hyperlipidemia    Hypertension    Hypothyroid    Peripheral vascular disease (HCC)    Pre-diabetes    Restless leg syndrome    Seizures (HCC)    02/13/2011 -hx of seizure due to "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  no seizure since--pt does take keppra and office note from neurologist dr. Marjory Lies on this chart   Urinary incontinence     Past Surgical History:  Procedure Laterality Date   CARPAL TUNNEL RELEASE     left   COLONOSCOPY WITH PROPOFOL N/A 06/17/2022   Procedure: COLONOSCOPY WITH PROPOFOL;  Surgeon: Regis Bill, MD;  Location: [facility];  Service: Endoscopy;  Laterality: N/A;   JOINT REPLACEMENT  08/26/2011   right total knee arthroplasty, left total knee   KNEE ARTHROPLASTY  10/28/2011   Procedure: COMPUTER ASSISTED TOTAL KNEE ARTHROPLASTY;  Surgeon: Carlisle Beers Rendall III;  Location: [facility];  Service: Orthopedics;  Laterality: Left;  preop femoral nerve block   KNEE SURGERY      right   laminectomy and diskectomy     L4-5 with fusion   PROCTOSCOPY N/A 06/18/2022   Procedure: RIGID PROCTOSCOPY;  Surgeon: Karie Soda, MD;  Location: [facility];  Service: General;  Laterality: N/A;    Social History   Socioeconomic History   Marital status: Widowed    Spouse name: Not on file   Number of children: Not on file   Years of education: Not on file   Highest education level: Not on file  Occupational History   Not on file  Tobacco Use   Smoking status: Every Day    Current packs/day: 2.00    Average packs/day: 2.0 packs/day for 50.0 years (100.0 ttl pk-yrs)    Types: Cigarettes   Smokeless tobacco: Never  Vaping Use   Vaping status: Every Day   Substances: Nicotine  Substance and Sexual Activity   Alcohol use: Yes    Comment: occasional   Drug use: No   Sexual activity: Yes    Birth control/protection: Post-menopausal  Other Topics Concern   Not on file  Social History Narrative   ** Merged History Encounter **       Social Determinants of Health   Financial Resource Strain: Not on file  Food Insecurity: No Food Insecurity (05/03/2023)   Hunger Vital Sign    Worried About Running Out of Food **Note De-Identified via Obfucation** in the Lat Year: Never true    Ran Out of Food in the Lat Year: Never true  Tranportation Need: No Tranportation Need (05/03/2023)   PRAPARE - Adminitrator, Civil Service (Medical): No    Lack of Tranportation (Non-Medical): No  Phyical Activity: Not on file  Stre: Not on file  Social Connection: Not on file  Intimate Partner Violence: Not At Rik (04/27/2023)   Humiliation, Afraid, Rape, and Kick quetionnaire    Fear of Current or Ex-Partner: No    Emotionally Abued: No    Phyically Abued: No    Sexually Abued: No    Family Hitory  Problem Relation Age of Onet   Heart attack Father 40   Alzheimer' dieae Mother    Breat cancer Neg Hx     Allergie  Allergen Reaction   Naid Other (See Comment)    Pt tate it  mee up her kidney But take apirin & ibuprofen   Thiazide-Type Diuretic     Other reaction(): Unknown    Review of Sytem  Contitutional: Negative.   HENT: Negative.    Eye: Negative.   Repiratory:  Poitive for hortne of breath (on exertion).   Cardiovacular: Negative.   Gatrointetinal: Negative.   Genitourinary: Negative.   Skin: Negative.   Endo/Heme/Allergie: Negative.        Objective:   BP 138/78   Pule 86   Ht 5\' 6"  (1.676 m)   Wt 185 lb (83.9 kg)   SpO2 91%   BMI 29.86 kg/m   Vital:   07/19/23 1312  BP: 138/78  Pule: 86  Height: 5\' 6"  (1.676 m)  Weight: 185 lb (83.9 kg)  SpO2: 91%  BMI (Calculated): 29.87    Phyical Exam Vital reviewed.  Contitutional:      General: She i not in acute ditre. HENT:     Head: Normocephalic.     Noe: Noe normal.     Mouth/Throat:     Mouth: Mucou membrane are moit.  Eye:     Extraocular Movement: Extraocular movement intact.     Pupil: Pupil are equal, round, and reactive to light.  Cardiovacular:     Rate and Rhythm: Normal rate and regular rhythm.     Heart ound: No murmur heard. Pulmonary:     Effort: Pulmonary effort i normal.     Breath ound: Decreaed air movement preent. No rhonchi or rale.  Abdominal:     General: Abdomen i flat.     Palpation: There i no hepatomegaly, plenomegaly or ma.  Muculokeletal:        General: Normal range of motion.     Cervical back: Normal range of motion. No tenderne.  Skin:    General: Skin i warm and dry.  Neurological:     General: No focal deficit preent.     Mental Statu: She i alert and oriented to peron, place, and time.     Cranial Nerve: No cranial nerve deficit.     Motor: No weakne.  Pychiatric:        Mood and Affect: Mood normal.        Behavior: Behavior normal.      No reult found for any viit on 07/19/23.      Aement & Plan:  A per problem lit  Problem Lit Item  Addreed Thi Viit       Cardiovacular and Mediatinum   Hypertenion - Primary   Relevant Medication   cloNIDine (CATAPRES)  0.2 MG tablet   olmesartan (BENICAR) 40 MG tablet   minoxidil (LONITEN) 2.5 MG tablet   Other Relevant Orders   Comprehensive metabolic panel     Digestive   GERD (gastroesophageal reflux disease)   Relevant Medications   pantoprazole (PROTONIX) 40 MG tablet     Endocrine   Hypothyroidism   Relevant Orders   TSH   Type 2 diabetes mellitus with peripheral neuropathy (HCC)   Relevant Medications   olmesartan (BENICAR) 40 MG tablet   Other Relevant Orders   Lipid panel   Hemoglobin A1c    Return in about 3 months (around 10/19/2023) for fu with labs prior.   Total time spent: 20 minutes  Luna Fuse, MD  07/19/2023   This document may have been prepared by [facility] Voice Recognition software and as such may include unintentional dictation errors.

## 2023-07-21 ENCOUNTER — Ambulatory Visit (INDEPENDENT_AMBULATORY_CARE_PROVIDER_SITE_OTHER): Payer: 59 | Admitting: Nurse Practitioner

## 2023-08-01 ENCOUNTER — Telehealth (INDEPENDENT_AMBULATORY_CARE_PROVIDER_SITE_OTHER): Payer: Self-pay | Admitting: Nurse Practitioner

## 2023-08-01 NOTE — Telephone Encounter (Signed)
Patient had [facility] call stating they never received the referral and asked for it to be resent   Please call and advise

## 2023-08-01 NOTE — Telephone Encounter (Signed)
Referral has been fax and patient was notified

## 2023-08-05 ENCOUNTER — Ambulatory Visit (INDEPENDENT_AMBULATORY_CARE_PROVIDER_SITE_OTHER): Payer: 59 | Admitting: Nurse Practitioner

## 2023-08-05 ENCOUNTER — Encounter (INDEPENDENT_AMBULATORY_CARE_PROVIDER_SITE_OTHER): Payer: Self-pay | Admitting: Nurse Practitioner

## 2023-08-05 VITALS — BP 171/103 | HR 105 | Resp 18 | Ht 66.0 in | Wt 182.2 lb

## 2023-08-05 DIAGNOSIS — I8312 Varicose veins of left lower extremity with inflammation: Secondary | ICD-10-CM | POA: Diagnosis not present

## 2023-08-05 DIAGNOSIS — I8311 Varicose veins of right lower extremity with inflammation: Secondary | ICD-10-CM | POA: Diagnosis not present

## 2023-08-06 NOTE — Progress Notes (Signed)
Varicose veins of bilateral  lower extremity with inflammation (454.1  I83.10) Current Plans   Indication: Patient presents with symptomatic varicose veins of the bilateral  lower extremity.   Procedure: Sclerotherapy using hypertonic saline mixed with 1% Lidocaine was performed on the bilateral lower extremity. Compression wraps were placed. The patient tolerated the procedure well. 

## 2023-08-10 ENCOUNTER — Ambulatory Visit: Payer: 59 | Admitting: Internal Medicine

## 2023-08-10 ENCOUNTER — Encounter: Payer: Self-pay | Admitting: Internal Medicine

## 2023-08-10 VITALS — BP 170/110 | HR 115 | Ht 66.0 in | Wt 185.6 lb

## 2023-08-10 DIAGNOSIS — I10 Essential (primary) hypertension: Secondary | ICD-10-CM

## 2023-08-10 DIAGNOSIS — J449 Chronic obstructive pulmonary disease, unspecified: Secondary | ICD-10-CM | POA: Diagnosis not present

## 2023-08-10 DIAGNOSIS — F17218 Nicotine dependence, cigarettes, with other nicotine-induced disorders: Secondary | ICD-10-CM | POA: Diagnosis not present

## 2023-08-10 DIAGNOSIS — B351 Tinea unguium: Secondary | ICD-10-CM | POA: Insufficient documentation

## 2023-08-10 DIAGNOSIS — R0602 Shortness of breath: Secondary | ICD-10-CM | POA: Diagnosis not present

## 2023-08-10 MED ORDER — TERBINAFINE HCL 250 MG PO TABS
250.0000 mg | ORAL_TABLET | Freq: Every day | ORAL | 0 refills | Status: AC
Start: 2023-08-10 — End: 2023-09-21

## 2023-08-10 NOTE — Progress Notes (Signed)
Established Patient Office Visit  Subjective:  Patient ID: [name], female    DOB: [birth year]  Age: 67 y.o. MRN: [id number]  Chief Complaint  Patient presents with   Follow-up    Dust Bust    Here for BP fu. Elevated today as she'Raelene Trew non compliant with 2 of her prescribed antihypertensives.    No other concerns at this time.   Past Medical History:  Diagnosis Date   (HFpEF) heart failure with preserved ejection fraction (HCC)    Acute renal failure (HCC) 03/29/2011   Acute respiratory distress 11/2020   Acute respiratory failure (HCC) 04/14/2014   Anxiety    Arthritis    CHF (congestive heart failure) (HCC)    Chronic respiratory failure with hypoxia and hypercapnia (HCC)    Community acquired pneumonia 01/10/2013   COPD (chronic obstructive pulmonary disease) (HCC)    no inhalers--smoker, no oxygen   Depression    Dyspnea    Emphysema lung (HCC)    GERD (gastroesophageal reflux disease)    Hyperlipidemia    Hypertension    Hypothyroid    Peripheral vascular disease (HCC)    Pre-diabetes    Restless leg syndrome    Seizures (HCC)    02/13/2011 -hx of seizure due to "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  no seizure since--pt does take keppra and office note from neurologist dr. Marjory Lies on this chart   Urinary incontinence     Past Surgical History:  Procedure Laterality Date   CARPAL TUNNEL RELEASE     left   COLONOSCOPY WITH PROPOFOL N/A 06/17/2022   Procedure: COLONOSCOPY WITH PROPOFOL;  Surgeon: Regis Bill, MD;  Location: [facility];  Service: Endoscopy;  Laterality: N/A;   JOINT REPLACEMENT  08/26/2011   right total knee arthroplasty, left total knee   KNEE ARTHROPLASTY  10/28/2011   Procedure: COMPUTER ASSISTED TOTAL KNEE ARTHROPLASTY;  Surgeon: Carlisle Beers Rendall III;  Location: [facility];  Service: Orthopedics;  Laterality: Left;  preop femoral  nerve block   KNEE SURGERY     right   laminectomy and diskectomy     L4-5 with fusion   PROCTOSCOPY N/A 06/18/2022   Procedure: RIGID PROCTOSCOPY;  Surgeon: Karie Soda, MD;  Location: [facility];  Service: General;  Laterality: N/A;    Social History   Socioeconomic History   Marital status: Widowed    Spouse name: Not on file   Number of children: Not on file   Years of education: Not on file   Highest education level: Not on file  Occupational History   Not on file  Tobacco Use   Smoking status: Every Day    Current packs/day: 2.00    Average packs/day: 2.0 packs/day for 50.0 years (100.0 ttl pk-yrs)    Types: Cigarettes   Smokeless tobacco: Never  Vaping Use   Vaping status: Every Day   Substances: Nicotine  Substance and Sexual Activity   Alcohol use: Yes    Comment: occasional   Drug use: No   Sexual activity: Yes    Birth control/protection: Post-menopausal  Other Topics Concern   Not on file  Social History Narrative   ** Merged History Encounter **       Social Determinants of Health   Financial Resource Strain: Not on file  Food Insecurity: No Food Insecurity (05/03/2023)   Hunger Vital Sign    Worried  About Running Out of Food in the Last Year: Never true    Ran Out of Food in the Last Year: Never true  Transportation Needs: No Transportation Needs (05/03/2023)   PRAPARE - Administrator, Civil Service (Medical): No    Lack of Transportation (Non-Medical): No  Physical Activity: Not on file  Stress: Not on file  Social Connections: Not on file  Intimate Partner Violence: Not At Risk (04/27/2023)   Humiliation, Afraid, Rape, and Kick questionnaire    Fear of Current or Ex-Partner: No    Emotionally Abused: No    Physically Abused: No    Sexually Abused: No    Family History  Problem Relation Age of Onset   Heart attack Father 74   Alzheimer'Kahlie Deutscher disease Mother    Breast cancer Neg Hx     Allergies  Allergen Reactions   Nsaids Other  (See Comments)    Pt states it messes up her kidneys But takes aspirin & ibuprofen   Thiazide-Type Diuretics     Other reaction(Valborg Friar): Unknown    Review of Systems  Constitutional: Negative.   HENT: Negative.    Eyes: Negative.   Respiratory:  Positive for shortness of breath (on exertion).   Cardiovascular: Negative.   Gastrointestinal: Negative.   Genitourinary: Negative.   Skin: Negative.   Neurological:  Positive for headaches (chronic).  Endo/Heme/Allergies: Negative.        Objective:   BP (!) 170/110   Pulse (!) 115   Ht 5\' 6"  (1.676 m)   Wt 185 lb 9.6 oz (84.2 kg)   SpO2 92%   BMI 29.96 kg/m   Vitals:   08/10/23 1023  BP: (!) 170/110  Pulse: (!) 115  Height: 5\' 6"  (1.676 m)  Weight: 185 lb 9.6 oz (84.2 kg)  SpO2: 92%  BMI (Calculated): 29.97    Physical Exam Vitals reviewed.  Constitutional:      General: She is not in acute distress. HENT:     Head: Normocephalic.     Nose: Nose normal.     Mouth/Throat:     Mouth: Mucous membranes are moist.  Eyes:     Extraocular Movements: Extraocular movements intact.     Pupils: Pupils are equal, round, and reactive to light.  Cardiovascular:     Rate and Rhythm: Normal rate and regular rhythm.     Heart sounds: No murmur heard. Pulmonary:     Effort: Pulmonary effort is normal.     Breath sounds: Decreased air movement present. No rhonchi or rales.  Abdominal:     General: Abdomen is flat.     Palpations: There is no hepatomegaly, splenomegaly or mass.  Musculoskeletal:        General: Normal range of motion.     Cervical back: Normal range of motion. No tenderness.  Skin:    General: Skin is warm and dry.     Comments: Onychomycosis of right big toenail  Neurological:     General: No focal deficit present.     Mental Status: She is alert and oriented to person, place, and time.     Cranial Nerves: No cranial nerve deficit.     Motor: No weakness.  Psychiatric:        Mood and Affect: Mood normal.         Behavior: Behavior normal.      No results found for any visits on 08/10/23.      Assessment & Plan:  As per problem  list. Patient instructed to comply with medications as prescribed   Problem List Items Addressed This Visit       Cardiovascular and Mediastinum   Hypertension     Musculoskeletal and Integument   Onychomycosis - Primary    Return in about 2 weeks (around 08/24/2023) for BP followup.   Total time spent: 30 minutes  Luna Fuse, MD  08/10/2023   This document may have been prepared by [facility] Voice Recognition software and as such may include unintentional dictation errors.

## 2023-08-16 ENCOUNTER — Ambulatory Visit (INDEPENDENT_AMBULATORY_CARE_PROVIDER_SITE_OTHER): Payer: 59 | Admitting: Nurse Practitioner

## 2023-08-17 DIAGNOSIS — D631 Anemia in chronic kidney disease: Secondary | ICD-10-CM | POA: Diagnosis not present

## 2023-08-17 DIAGNOSIS — R8290 Unspecified abnormal findings in urine: Secondary | ICD-10-CM | POA: Diagnosis not present

## 2023-08-17 DIAGNOSIS — R809 Proteinuria, unspecified: Secondary | ICD-10-CM | POA: Diagnosis not present

## 2023-08-17 DIAGNOSIS — E1122 Type 2 diabetes mellitus with diabetic chronic kidney disease: Secondary | ICD-10-CM | POA: Diagnosis not present

## 2023-08-17 DIAGNOSIS — I129 Hypertensive chronic kidney disease with stage 1 through stage 4 chronic kidney disease, or unspecified chronic kidney disease: Secondary | ICD-10-CM | POA: Diagnosis not present

## 2023-08-17 DIAGNOSIS — N1832 Chronic kidney disease, stage 3b: Secondary | ICD-10-CM | POA: Diagnosis not present

## 2023-08-17 DIAGNOSIS — E875 Hyperkalemia: Secondary | ICD-10-CM | POA: Diagnosis not present

## 2023-08-23 ENCOUNTER — Telehealth: Payer: Self-pay | Admitting: Internal Medicine

## 2023-08-23 NOTE — Telephone Encounter (Signed)
Patient left VM that she fell and cut her leg and was wondering if TJ can stitch her up? Called patient back and left VM that she needs to go to [facility] or ED for stitches.

## 2023-08-24 DIAGNOSIS — S81811A Laceration without foreign body, right lower leg, initial encounter: Secondary | ICD-10-CM | POA: Diagnosis not present

## 2023-08-29 ENCOUNTER — Ambulatory Visit: Payer: 59 | Admitting: Internal Medicine

## 2023-09-01 ENCOUNTER — Ambulatory Visit (INDEPENDENT_AMBULATORY_CARE_PROVIDER_SITE_OTHER): Payer: 59 | Admitting: Nurse Practitioner

## 2023-09-01 ENCOUNTER — Other Ambulatory Visit: Payer: Self-pay

## 2023-09-01 VITALS — BP 168/91 | HR 84 | Resp 20 | Ht 66.0 in | Wt 192.6 lb

## 2023-09-01 DIAGNOSIS — I8312 Varicose veins of left lower extremity with inflammation: Secondary | ICD-10-CM

## 2023-09-01 DIAGNOSIS — I8311 Varicose veins of right lower extremity with inflammation: Secondary | ICD-10-CM | POA: Diagnosis not present

## 2023-09-02 ENCOUNTER — Encounter (INDEPENDENT_AMBULATORY_CARE_PROVIDER_SITE_OTHER): Payer: Self-pay | Admitting: Nurse Practitioner

## 2023-09-02 ENCOUNTER — Other Ambulatory Visit: Payer: Self-pay | Admitting: Internal Medicine

## 2023-09-02 MED ORDER — GABAPENTIN 800 MG PO TABS: 800.0000 mg | ORAL_TABLET | Freq: Three times a day (TID) | ORAL | 2 refills | Status: DC

## 2023-09-02 MED ORDER — ESCITALOPRAM OXALATE 20 MG PO TABS: 20.0000 mg | ORAL_TABLET | Freq: Every day | ORAL | 2 refills | Status: DC

## 2023-09-02 MED ORDER — PRAMIPEXOLE DIHYDROCHLORIDE 0.5 MG PO TABS: 0.5000 mg | ORAL_TABLET | Freq: Every day | ORAL | 2 refills | Status: DC

## 2023-09-02 NOTE — Progress Notes (Signed)
Varicose veins of left  lower extremity with inflammation (454.1  I83.10) Current Plans   Indication: Patient presents with symptomatic varicose veins of the left  lower extremity.   Procedure: Sclerotherapy using hypertonic saline mixed with 1% Lidocaine was performed on the eft lower extremity. Compression wraps were placed. The patient tolerated the procedure well.

## 2023-09-06 ENCOUNTER — Ambulatory Visit: Payer: 59 | Admitting: Internal Medicine

## 2023-09-09 ENCOUNTER — Other Ambulatory Visit: Payer: Self-pay

## 2023-09-09 DIAGNOSIS — I10 Essential (primary) hypertension: Secondary | ICD-10-CM

## 2023-09-09 MED ORDER — OLMESARTAN MEDOXOMIL 40 MG PO TABS: 40.0000 mg | ORAL_TABLET | Freq: Every day | ORAL | 0 refills | Status: DC

## 2023-09-12 ENCOUNTER — Other Ambulatory Visit: Payer: 59 | Admitting: Urology

## 2023-09-12 DIAGNOSIS — M961 Postlaminectomy syndrome, not elsewhere classified: Secondary | ICD-10-CM | POA: Diagnosis not present

## 2023-09-12 DIAGNOSIS — G8929 Other chronic pain: Secondary | ICD-10-CM | POA: Diagnosis not present

## 2023-09-12 DIAGNOSIS — M545 Low back pain, unspecified: Secondary | ICD-10-CM | POA: Diagnosis not present

## 2023-09-13 ENCOUNTER — Encounter (INDEPENDENT_AMBULATORY_CARE_PROVIDER_SITE_OTHER): Payer: Self-pay

## 2023-09-13 ENCOUNTER — Ambulatory Visit (INDEPENDENT_AMBULATORY_CARE_PROVIDER_SITE_OTHER): Payer: 59 | Admitting: Nurse Practitioner

## 2023-09-13 DIAGNOSIS — N1832 Chronic kidney disease, stage 3b: Secondary | ICD-10-CM | POA: Diagnosis not present

## 2023-09-13 DIAGNOSIS — D631 Anemia in chronic kidney disease: Secondary | ICD-10-CM | POA: Diagnosis not present

## 2023-09-13 DIAGNOSIS — E1122 Type 2 diabetes mellitus with diabetic chronic kidney disease: Secondary | ICD-10-CM | POA: Diagnosis not present

## 2023-09-13 DIAGNOSIS — R809 Proteinuria, unspecified: Secondary | ICD-10-CM | POA: Diagnosis not present

## 2023-09-13 DIAGNOSIS — I129 Hypertensive chronic kidney disease with stage 1 through stage 4 chronic kidney disease, or unspecified chronic kidney disease: Secondary | ICD-10-CM | POA: Diagnosis not present

## 2023-09-13 DIAGNOSIS — R319 Hematuria, unspecified: Secondary | ICD-10-CM | POA: Diagnosis not present

## 2023-09-13 DIAGNOSIS — E875 Hyperkalemia: Secondary | ICD-10-CM | POA: Diagnosis not present

## 2023-09-14 ENCOUNTER — Encounter: Payer: 59 | Attending: Internal Medicine | Admitting: Internal Medicine

## 2023-09-14 DIAGNOSIS — L97818 Non-pressure chronic ulcer of other part of right lower leg with other specified severity: Secondary | ICD-10-CM | POA: Diagnosis not present

## 2023-09-14 DIAGNOSIS — I130 Hypertensive heart and chronic kidney disease with heart failure and stage 1 through stage 4 chronic kidney disease, or unspecified chronic kidney disease: Secondary | ICD-10-CM | POA: Insufficient documentation

## 2023-09-14 DIAGNOSIS — S81801A Unspecified open wound, right lower leg, initial encounter: Secondary | ICD-10-CM | POA: Diagnosis not present

## 2023-09-14 DIAGNOSIS — I509 Heart failure, unspecified: Secondary | ICD-10-CM | POA: Diagnosis not present

## 2023-09-14 DIAGNOSIS — X58XXXD Exposure to other specified factors, subsequent encounter: Secondary | ICD-10-CM | POA: Diagnosis not present

## 2023-09-14 DIAGNOSIS — S80811D Abrasion, right lower leg, subsequent encounter: Secondary | ICD-10-CM | POA: Insufficient documentation

## 2023-09-14 DIAGNOSIS — I87321 Chronic venous hypertension (idiopathic) with inflammation of right lower extremity: Secondary | ICD-10-CM | POA: Insufficient documentation

## 2023-09-14 DIAGNOSIS — F17210 Nicotine dependence, cigarettes, uncomplicated: Secondary | ICD-10-CM | POA: Insufficient documentation

## 2023-09-14 DIAGNOSIS — N1832 Chronic kidney disease, stage 3b: Secondary | ICD-10-CM | POA: Insufficient documentation

## 2023-09-14 DIAGNOSIS — J449 Chronic obstructive pulmonary disease, unspecified: Secondary | ICD-10-CM | POA: Insufficient documentation

## 2023-09-14 DIAGNOSIS — E1122 Type 2 diabetes mellitus with diabetic chronic kidney disease: Secondary | ICD-10-CM | POA: Insufficient documentation

## 2023-09-19 DIAGNOSIS — R319 Hematuria, unspecified: Secondary | ICD-10-CM | POA: Diagnosis not present

## 2023-09-19 DIAGNOSIS — N1832 Chronic kidney disease, stage 3b: Secondary | ICD-10-CM | POA: Diagnosis not present

## 2023-09-19 DIAGNOSIS — R809 Proteinuria, unspecified: Secondary | ICD-10-CM | POA: Diagnosis not present

## 2023-09-21 ENCOUNTER — Encounter: Payer: 59 | Admitting: Internal Medicine

## 2023-09-21 DIAGNOSIS — I87321 Chronic venous hypertension (idiopathic) with inflammation of right lower extremity: Secondary | ICD-10-CM | POA: Diagnosis not present

## 2023-09-21 DIAGNOSIS — L97818 Non-pressure chronic ulcer of other part of right lower leg with other specified severity: Secondary | ICD-10-CM | POA: Diagnosis not present

## 2023-09-21 DIAGNOSIS — S80811D Abrasion, right lower leg, subsequent encounter: Secondary | ICD-10-CM | POA: Diagnosis not present

## 2023-09-21 DIAGNOSIS — E1122 Type 2 diabetes mellitus with diabetic chronic kidney disease: Secondary | ICD-10-CM | POA: Diagnosis not present

## 2023-09-21 DIAGNOSIS — I130 Hypertensive heart and chronic kidney disease with heart failure and stage 1 through stage 4 chronic kidney disease, or unspecified chronic kidney disease: Secondary | ICD-10-CM | POA: Diagnosis not present

## 2023-09-21 DIAGNOSIS — I509 Heart failure, unspecified: Secondary | ICD-10-CM | POA: Diagnosis not present

## 2023-09-21 DIAGNOSIS — F17210 Nicotine dependence, cigarettes, uncomplicated: Secondary | ICD-10-CM | POA: Diagnosis not present

## 2023-09-21 DIAGNOSIS — N1832 Chronic kidney disease, stage 3b: Secondary | ICD-10-CM | POA: Diagnosis not present

## 2023-09-21 DIAGNOSIS — S81801A Unspecified open wound, right lower leg, initial encounter: Secondary | ICD-10-CM | POA: Diagnosis not present

## 2023-09-21 DIAGNOSIS — J449 Chronic obstructive pulmonary disease, unspecified: Secondary | ICD-10-CM | POA: Diagnosis not present

## 2023-09-26 ENCOUNTER — Other Ambulatory Visit: Payer: Self-pay | Admitting: Internal Medicine

## 2023-09-26 ENCOUNTER — Other Ambulatory Visit: Payer: Self-pay

## 2023-09-26 DIAGNOSIS — G4700 Insomnia, unspecified: Secondary | ICD-10-CM

## 2023-09-27 MED ORDER — ESCITALOPRAM OXALATE 20 MG PO TABS: 20.0000 mg | ORAL_TABLET | Freq: Every day | ORAL | 3 refills | Status: DC
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
## 2023-09-28 ENCOUNTER — Telehealth: Payer: Self-pay

## 2023-09-28 ENCOUNTER — Encounter: Payer: 59 | Admitting: Internal Medicine

## 2023-09-28 DIAGNOSIS — L97812 Non-pressure chronic ulcer of other part of right lower leg with fat layer exposed: Secondary | ICD-10-CM | POA: Diagnosis not present

## 2023-09-28 DIAGNOSIS — S80811D Abrasion, right lower leg, subsequent encounter: Secondary | ICD-10-CM | POA: Diagnosis not present

## 2023-09-28 DIAGNOSIS — I130 Hypertensive heart and chronic kidney disease with heart failure and stage 1 through stage 4 chronic kidney disease, or unspecified chronic kidney disease: Secondary | ICD-10-CM | POA: Diagnosis not present

## 2023-09-28 DIAGNOSIS — I87321 Chronic venous hypertension (idiopathic) with inflammation of right lower extremity: Secondary | ICD-10-CM | POA: Diagnosis not present

## 2023-09-28 DIAGNOSIS — N1832 Chronic kidney disease, stage 3b: Secondary | ICD-10-CM | POA: Diagnosis not present

## 2023-09-28 DIAGNOSIS — I509 Heart failure, unspecified: Secondary | ICD-10-CM | POA: Diagnosis not present

## 2023-09-28 DIAGNOSIS — E11622 Type 2 diabetes mellitus with other skin ulcer: Secondary | ICD-10-CM | POA: Diagnosis not present

## 2023-09-28 DIAGNOSIS — F17210 Nicotine dependence, cigarettes, uncomplicated: Secondary | ICD-10-CM | POA: Diagnosis not present

## 2023-09-28 DIAGNOSIS — J449 Chronic obstructive pulmonary disease, unspecified: Secondary | ICD-10-CM | POA: Diagnosis not present

## 2023-09-28 DIAGNOSIS — L97818 Non-pressure chronic ulcer of other part of right lower leg with other specified severity: Secondary | ICD-10-CM | POA: Diagnosis not present

## 2023-09-28 DIAGNOSIS — E1122 Type 2 diabetes mellitus with diabetic chronic kidney disease: Secondary | ICD-10-CM | POA: Diagnosis not present

## 2023-09-29 ENCOUNTER — Other Ambulatory Visit: Payer: Self-pay

## 2023-09-30 ENCOUNTER — Other Ambulatory Visit: Payer: Self-pay | Admitting: Internal Medicine

## 2023-09-30 ENCOUNTER — Other Ambulatory Visit: Payer: Self-pay

## 2023-09-30 DIAGNOSIS — G4700 Insomnia, unspecified: Secondary | ICD-10-CM

## 2023-09-30 DIAGNOSIS — I10 Essential (primary) hypertension: Secondary | ICD-10-CM

## 2023-09-30 NOTE — Progress Notes (Signed)
wound dressing protected with water repellent cover or cast protector. Anesthetic (Use 'Patient Medications' Section for Anesthetic Order Entry) Lidocaine applied to wound bed Edema Control - Lymphedema / Segmental Compressive Device / Other Elevate, Exercise Daily and A void Standing for Long Periods of Time. Elevate legs to the level of the heart and pump ankles as often as possible Elevate leg(s) parallel to the floor when sitting. Wound Treatment Wound #1 - Lower Leg Wound Laterality: Right Cleanser: Soap and Water 1 x Per Week/30 Days Discharge Instructions: Gently cleanse wound with antibacterial soap, rinse and pat dry prior to dressing wounds Cleanser: Wound Cleanser 1 x Per Week/30 Days Discharge Instructions: Wash your hands with soap and water. Remove old dressing, discard into plastic bag and place into trash. Cleanse the wound with Wound Cleanser prior to applying a clean dressing using gauze sponges, not tissues or cotton balls. Do not scrub or use excessive force. Pat dry using gauze sponges, not tissue or cotton balls. Prim Dressing: Hydrofera Blue Ready Transfer Foam, 4x5 (in/in) 1 x Per Week/30 Days ary Discharge Instructions: Apply Hydrofera Blue Ready to wound bed as directed Secondary Dressing: ABD Pad 5x9 (in/in) 1 x Per Week/30 Days Discharge Instructions: Cover with ABD pad Compression Wrap: Urgo K2 Lite, two layer compression system, regular 1 x Per Week/30 Days Electronic Signature(s) Signed: 09/28/2023 4:42:07 PM By: Baltazar Najjar MD Signed: 09/30/2023 11:41:18 AM By: Yevonne Pax RN Entered By: Yevonne Pax on 09/28/2023 09:55:47 Wenberg, Sullivan S ([id number]) [id number] Page 3 of  6 -------------------------------------------------------------------------------- Problem List Details Patient Name: Date of Service: [name], DEWITTE 09/28/2023 12:45 PM Medical Record Number: [id number] Patient Account Number: [id number] Date of Birth/Sex: Treating RN: 05-Sep-1956 (67 y.o. [name] Primary Care Provider: Gillermo Murdoch Other Clinician: Referring Provider: Treating Provider/Extender: [name], [name] [name], [name]: 2 Active Problems ICD-10 Encounter Code Description Active Date MDM Diagnosis L97.818 Non-pressure chronic ulcer of other part of right lower leg with other specified 09/14/2023 No Yes severity S80.811D Abrasion, right lower leg, subsequent encounter 09/14/2023 No Yes I87.321 Chronic venous hypertension (idiopathic) with inflammation of right lower 09/14/2023 No Yes extremity Inactive Problems Resolved Problems Electronic Signature(s) Signed: 09/28/2023 4:42:07 PM By: Baltazar Najjar MD Entered By: Baltazar Najjar on 09/28/2023 09:57:06 -------------------------------------------------------------------------------- Progress Note Details Patient Name: Date of Service: [name] 09/28/2023 12:45 PM Medical Record Number: [id number] Patient Account Number: [id number] Date of Birth/Sex: Treating RN: [birth year] (67 y.o. [name] Primary Care Provider: Gillermo Murdoch Other Clinician: Referring Provider: Treating Provider/Extender: [name], [name] [name], [name]: 2 Subjective Spielmann, [name] ([id number]) [id number] Page 4 of 6 History of Present Illness (HPI) ADMISSION 09/14/2023 This is a 67 year old woman whose problem began around 9 September she was stepping up onto a curb fell and suffered a skin tear substantially on the right lower leg. She was seen in by primary care given mupirocin and a course of cefadroxil. The cefadroxil was for 14 days. She  is still applying the the mupirocin. She does not have a prior wound history in this leg although she probably has some degree of chronic venous insufficiency Past medical history includes type 2 diabetes in the chart although the patient claims she is prediabetic, chronic kidney disease stage IIIb, varicose veins followed previously by vascular vascular surgery, hypertension, headaches, CHF, COPD, gastroesophageal reflux disease, peripheral vascular disease and degenerative disc disease ABI in the right was 0.81 in our clinic. Patient is  wound dressing protected with water repellent cover or cast protector. Anesthetic (Use 'Patient Medications' Section for Anesthetic Order Entry) Lidocaine applied to wound bed Edema Control - Lymphedema / Segmental Compressive Device / Other Elevate, Exercise Daily and A void Standing for Long Periods of Time. Elevate legs to the level of the heart and pump ankles as often as possible Elevate leg(s) parallel to the floor when sitting. Wound Treatment Wound #1 - Lower Leg Wound Laterality: Right Cleanser: Soap and Water 1 x Per Week/30 Days Discharge Instructions: Gently cleanse wound with antibacterial soap, rinse and pat dry prior to dressing wounds Cleanser: Wound Cleanser 1 x Per Week/30 Days Discharge Instructions: Wash your hands with soap and water. Remove old dressing, discard into plastic bag and place into trash. Cleanse the wound with Wound Cleanser prior to applying a clean dressing using gauze sponges, not tissues or cotton balls. Do not scrub or use excessive force. Pat dry using gauze sponges, not tissue or cotton balls. Prim Dressing: Hydrofera Blue Ready Transfer Foam, 4x5 (in/in) 1 x Per Week/30 Days ary Discharge Instructions: Apply Hydrofera Blue Ready to wound bed as directed Secondary Dressing: ABD Pad 5x9 (in/in) 1 x Per Week/30 Days Discharge Instructions: Cover with ABD pad Compression Wrap: Urgo K2 Lite, two layer compression system, regular 1 x Per Week/30 Days Electronic Signature(s) Signed: 09/28/2023 4:42:07 PM By: Baltazar Najjar MD Signed: 09/30/2023 11:41:18 AM By: Yevonne Pax RN Entered By: Yevonne Pax on 09/28/2023 09:55:47 Wenberg, Sullivan S ([id number]) [id number] Page 3 of  6 -------------------------------------------------------------------------------- Problem List Details Patient Name: Date of Service: [name], DEWITTE 09/28/2023 12:45 PM Medical Record Number: [id number] Patient Account Number: [id number] Date of Birth/Sex: Treating RN: 05-Sep-1956 (67 y.o. [name] Primary Care Provider: Gillermo Murdoch Other Clinician: Referring Provider: Treating Provider/Extender: [name], [name] [name], [name]: 2 Active Problems ICD-10 Encounter Code Description Active Date MDM Diagnosis L97.818 Non-pressure chronic ulcer of other part of right lower leg with other specified 09/14/2023 No Yes severity S80.811D Abrasion, right lower leg, subsequent encounter 09/14/2023 No Yes I87.321 Chronic venous hypertension (idiopathic) with inflammation of right lower 09/14/2023 No Yes extremity Inactive Problems Resolved Problems Electronic Signature(s) Signed: 09/28/2023 4:42:07 PM By: Baltazar Najjar MD Entered By: Baltazar Najjar on 09/28/2023 09:57:06 -------------------------------------------------------------------------------- Progress Note Details Patient Name: Date of Service: [name] 09/28/2023 12:45 PM Medical Record Number: [id number] Patient Account Number: [id number] Date of Birth/Sex: Treating RN: [birth year] (67 y.o. [name] Primary Care Provider: Gillermo Murdoch Other Clinician: Referring Provider: Treating Provider/Extender: [name], [name] [name], [name]: 2 Subjective Spielmann, [name] ([id number]) [id number] Page 4 of 6 History of Present Illness (HPI) ADMISSION 09/14/2023 This is a 67 year old woman whose problem began around 9 September she was stepping up onto a curb fell and suffered a skin tear substantially on the right lower leg. She was seen in by primary care given mupirocin and a course of cefadroxil. The cefadroxil was for 14 days. She  is still applying the the mupirocin. She does not have a prior wound history in this leg although she probably has some degree of chronic venous insufficiency Past medical history includes type 2 diabetes in the chart although the patient claims she is prediabetic, chronic kidney disease stage IIIb, varicose veins followed previously by vascular vascular surgery, hypertension, headaches, CHF, COPD, gastroesophageal reflux disease, peripheral vascular disease and degenerative disc disease ABI in the right was 0.81 in our clinic. Patient is  excessive force. Pat dry using gauze sponges, not tissue or cotton balls. Prim Dressing: Hydrofera Blue Ready Transfer Foam, 4x5 (in/in) 1 x Per Week/30 Days ary Discharge Instructions: Apply Hydrofera Blue Ready to wound bed as directed Secondary Dressing: ABD Pad 5x9 (in/in) 1 x Per Week/30 Days Discharge Instructions: Cover with ABD pad Com pression Wrap: Urgo K2 Lite, two layer compression system, regular 1 x Per Week/30 Days 1. Continue the Hydrofera Blue, ABD under Urgo K2 lite. 2. The wound is making nice progress. 3. We are going to order her 20/30 below-knee compression stockings from [employer] therapy I believe Electronic Signature(s) Signed: 09/28/2023 4:42:07 PM By: Baltazar Najjar MD Entered By: Baltazar Najjar on 09/28/2023 10:02:00 -------------------------------------------------------------------------------- SuperBill Details Patient Name: Date of Service: [name] 09/28/2023 Medical Record Number: [id number] Patient Account Number: [id number] Date of Birth/Sex: Treating RN: 1956/05/09 (67 y.o. [name] Primary Care Provider: Gillermo Murdoch Other Clinician: Referring Provider: Treating Provider/Extender: [name], [name] [name], [name]: 2 Diagnosis Coding ICD-10 Codes Code Description [phone] Non-pressure chronic ulcer of other part of right lower leg with other specified severity S80.811D Abrasion, right lower leg, subsequent encounter I87.321 Chronic  venous hypertension (idiopathic) with inflammation of right lower extremity Facility Procedures : CPT4 Code: 01601093 Description: (Facility Use Only) [phone] - APPLY MULTLAY COMPRS LWR RT LEG Modifier: Quantity: 1 Physician Procedures : CPT4 Code Description Modifier [id number] - WC PHYS LEVEL 3 - EST PT ICD-10 Diagnosis Description L97.818 Non-pressure chronic ulcer of other part of right lower leg with other specified severity S80.811D Abrasion, right lower leg, subsequent  encounter I87.321 Chronic venous hypertension (idiopathic) with inflammation of right lower extremity Quantity: 1 Electronic Signature(s) [facility], SERVICE ([id number]) [id number] Page 6 of 6 Signed: 09/28/2023 4:42:07 PM By: Baltazar Najjar MD Entered By: Baltazar Najjar on 09/28/2023 10:02:30  excessive force. Pat dry using gauze sponges, not tissue or cotton balls. Prim Dressing: Hydrofera Blue Ready Transfer Foam, 4x5 (in/in) 1 x Per Week/30 Days ary Discharge Instructions: Apply Hydrofera Blue Ready to wound bed as directed Secondary Dressing: ABD Pad 5x9 (in/in) 1 x Per Week/30 Days Discharge Instructions: Cover with ABD pad Com pression Wrap: Urgo K2 Lite, two layer compression system, regular 1 x Per Week/30 Days 1. Continue the Hydrofera Blue, ABD under Urgo K2 lite. 2. The wound is making nice progress. 3. We are going to order her 20/30 below-knee compression stockings from elastic therapy I believe Electronic Signature(s) Signed: 09/28/2023 4:42:07 PM By: Baltazar Najjar MD Entered By: Baltazar Najjar on 09/28/2023 10:02:00 -------------------------------------------------------------------------------- SuperBill Details Patient Name: Date of Service: [name] 09/28/2023 Medical Record Number: [id number] Patient Account Number: [id number] Date of Birth/Sex: Treating RN: 1956/05/09 (67 y.o. [name] Primary Care Provider: Gillermo Murdoch Other Clinician: Referring Provider: Treating Provider/Extender: [name], [name] [name], [name]: 2 Diagnosis Coding ICD-10 Codes Code Description [phone] Non-pressure chronic ulcer of other part of right lower leg with other specified severity S80.811D Abrasion, right lower leg, subsequent encounter I87.321 Chronic  venous hypertension (idiopathic) with inflammation of right lower extremity Facility Procedures : CPT4 Code: 01601093 Description: (Facility Use Only) [phone] - APPLY MULTLAY COMPRS LWR RT LEG Modifier: Quantity: 1 Physician Procedures : CPT4 Code Description Modifier [id number] - WC PHYS LEVEL 3 - EST PT ICD-10 Diagnosis Description L97.818 Non-pressure chronic ulcer of other part of right lower leg with other specified severity S80.811D Abrasion, right lower leg, subsequent  encounter I87.321 Chronic venous hypertension (idiopathic) with inflammation of right lower extremity Quantity: 1 Electronic Signature(s) [facility], SERVICE ([id number]) [id number] Page 6 of 6 Signed: 09/28/2023 4:42:07 PM By: Baltazar Najjar MD Entered By: Baltazar Najjar on 09/28/2023 10:02:30
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
## 2023-10-03 ENCOUNTER — Encounter: Payer: Self-pay | Admitting: Internal Medicine

## 2023-10-03 ENCOUNTER — Other Ambulatory Visit: Payer: Self-pay | Admitting: Internal Medicine

## 2023-10-03 ENCOUNTER — Ambulatory Visit (INDEPENDENT_AMBULATORY_CARE_PROVIDER_SITE_OTHER): Payer: 59 | Admitting: Internal Medicine

## 2023-10-03 ENCOUNTER — Ambulatory Visit: Payer: 59 | Admitting: Internal Medicine

## 2023-10-03 VITALS — BP 150/90 | HR 81 | Ht 66.0 in | Wt 184.2 lb

## 2023-10-03 DIAGNOSIS — I10 Essential (primary) hypertension: Secondary | ICD-10-CM

## 2023-10-03 DIAGNOSIS — J301 Allergic rhinitis due to pollen: Secondary | ICD-10-CM | POA: Diagnosis not present

## 2023-10-03 DIAGNOSIS — M47816 Spondylosis without myelopathy or radiculopathy, lumbar region: Secondary | ICD-10-CM | POA: Diagnosis not present

## 2023-10-03 DIAGNOSIS — E782 Mixed hyperlipidemia: Secondary | ICD-10-CM | POA: Diagnosis not present

## 2023-10-03 DIAGNOSIS — E1142 Type 2 diabetes mellitus with diabetic polyneuropathy: Secondary | ICD-10-CM | POA: Diagnosis not present

## 2023-10-03 DIAGNOSIS — Z1231 Encounter for screening mammogram for malignant neoplasm of breast: Secondary | ICD-10-CM

## 2023-10-03 MED ORDER — FENOFIBRATE MICRONIZED 134 MG PO CAPS
134.0000 mg | ORAL_CAPSULE | Freq: Every day | ORAL | 0 refills | Status: DC
Start: 2023-10-03 — End: 2024-01-04

## 2023-10-03 MED ORDER — FLUTICASONE PROPIONATE 50 MCG/ACT NA SUSP: 1.0000 | Freq: Every day | NASAL | 2 refills | Status: DC

## 2023-10-03 MED ORDER — CETIRIZINE HCL 10 MG PO TABS: 10.0000 mg | ORAL_TABLET | Freq: Every day | ORAL | 2 refills | Status: DC

## 2023-10-03 MED ORDER — GABAPENTIN 800 MG PO TABS
800.0000 mg | ORAL_TABLET | Freq: Three times a day (TID) | ORAL | 0 refills | Status: DC
Start: 2023-10-03 — End: 2023-11-29

## 2023-10-03 NOTE — Progress Notes (Signed)
Established Patient Office Visit  Subjective:  Patient ID: [name], female    DOB: 09/23/1956  Age: 67 y.o. MRN: [id number]  No chief complaint on file.   No new complaints, here for medication refills.    No other concerns at this time.   Past Medical History:  Diagnosis Date   (HFpEF) heart failure with preserved ejection fraction (HCC)    Acute renal failure (HCC) 03/29/2011   Acute respiratory distress 11/2020   Acute respiratory failure (HCC) 04/14/2014   Anxiety    Arthritis    CHF (congestive heart failure) (HCC)    Chronic respiratory failure with hypoxia and hypercapnia (HCC)    Community acquired pneumonia 01/10/2013   COPD (chronic obstructive pulmonary disease) (HCC)    no inhalers--smoker, no oxygen   Depression    Dyspnea    Emphysema lung (HCC)    GERD (gastroesophageal reflux disease)    Hyperlipidemia    Hypertension    Hypothyroid    Peripheral vascular disease (HCC)    Pre-diabetes    Restless leg syndrome    Seizures (HCC)    02/13/2011 -hx of seizure due to "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  no seizure since--pt does take keppra and office note from neurologist dr. Marjory Lies on this chart   Urinary incontinence     Past Surgical History:  Procedure Laterality Date   CARPAL TUNNEL RELEASE     left   COLONOSCOPY WITH PROPOFOL N/A 06/17/2022   Procedure: COLONOSCOPY WITH PROPOFOL;  Surgeon: Regis Bill, MD;  Location: [facility];  Service: Endoscopy;  Laterality: N/A;   JOINT REPLACEMENT  08/26/2011   right total knee arthroplasty, left total knee   KNEE ARTHROPLASTY  10/28/2011   Procedure: COMPUTER ASSISTED TOTAL KNEE ARTHROPLASTY;  Surgeon: Carlisle Beers Rendall III;  Location: [facility];  Service: Orthopedics;  Laterality: Left;  preop femoral nerve block   KNEE SURGERY     right   laminectomy and diskectomy     L4-5 with fusion    PROCTOSCOPY N/A 06/18/2022   Procedure: RIGID PROCTOSCOPY;  Surgeon: Karie Soda, MD;  Location: [facility];  Service: General;  Laterality: N/A;    Social History   Socioeconomic History   Marital status: Widowed    Spouse name: Not on file   Number of children: Not on file   Years of education: Not on file   Highest education level: Not on file  Occupational History   Not on file  Tobacco Use   Smoking status: Every Day    Current packs/day: 2.00    Average packs/day: 2.0 packs/day for 50.0 years (100.0 ttl pk-yrs)    Types: Cigarettes   Smokeless tobacco: Never  Vaping Use   Vaping status: Every Day   Substances: Nicotine  Substance and Sexual Activity   Alcohol use: Yes    Comment: occasional   Drug use: No   Sexual activity: Yes    Birth control/protection: Post-menopausal  Other Topics Concern   Not on file  Social History Narrative   ** Merged History Encounter **       Social Determinants of Health   Financial Resource Strain: Low Risk  (08/10/2023)   Received from [facility]   Overall Financial Resource Strain (CARDIA)    Difficulty of Paying Living Expenses: Not hard at all  Food Insecurity: No Food Insecurity (08/10/2023)  Received from [facility]   Hunger Vital Sign    Worried About Running Out of Food in the Last Year: Never true    Ran Out of Food in the Last Year: Never true  Transportation Needs: No Transportation Needs (08/10/2023)   Received from [facility] - Transportation    In the past 12 months, has lack of transportation kept you from medical appointments or from getting medications?: No    Lack of Transportation (Non-Medical): No  Physical Activity: Not on file  Stress: Not on file  Social Connections: Not on file  Intimate Partner Violence: Not At Risk (04/27/2023)   Humiliation, Afraid, Rape, and Kick questionnaire    Fear of Current or Ex-Partner: No    Emotionally  Abused: No    Physically Abused: No    Sexually Abused: No    Family History  Problem Relation Age of Onset   Heart attack Father 34   Alzheimer'Annslee Tercero disease Mother    Breast cancer Neg Hx     Allergies  Allergen Reactions   Nsaids Other (See Comments)    Pt states it messes up her kidneys But takes aspirin & ibuprofen   Thiazide-Type Diuretics     Other reaction(Deltha Bernales): Unknown    Review of Systems  Constitutional: Negative.   HENT: Negative.    Eyes: Negative.   Respiratory:  Positive for shortness of breath (on exertion).   Cardiovascular: Negative.   Gastrointestinal: Negative.   Genitourinary: Negative.   Skin: Negative.   Neurological:  Positive for headaches (chronic).  Endo/Heme/Allergies: Negative.        Objective:   BP (!) 150/90   Pulse 81   Ht 5\' 6"  (1.676 m)   Wt 184 lb 3.2 oz (83.6 kg)   SpO2 93%   BMI 29.73 kg/m   Vitals:   10/03/23 1200  BP: (!) 150/90  Pulse: 81  Height: 5\' 6"  (1.676 m)  Weight: 184 lb 3.2 oz (83.6 kg)  SpO2: 93%  BMI (Calculated): 29.75    Physical Exam Vitals reviewed.  Constitutional:      General: She is not in acute distress. HENT:     Head: Normocephalic.     Nose: Nose normal.     Mouth/Throat:     Mouth: Mucous membranes are moist.  Eyes:     Extraocular Movements: Extraocular movements intact.     Pupils: Pupils are equal, round, and reactive to light.  Cardiovascular:     Rate and Rhythm: Normal rate and regular rhythm.     Heart sounds: No murmur heard. Pulmonary:     Effort: Pulmonary effort is normal.     Breath sounds: Decreased air movement present. No rhonchi or rales.  Abdominal:     General: Abdomen is flat.     Palpations: There is no hepatomegaly, splenomegaly or mass.  Musculoskeletal:        General: Normal range of motion.     Cervical back: Normal range of motion. No tenderness.     Comments: Boot in place  Skin:    General: Skin is warm and dry.     Comments: Onychomycosis of right  big toenail. Right leg wound dressed.  Neurological:     General: No focal deficit present.     Mental Status: She is alert and oriented to person, place, and time.     Cranial Nerves: No cranial nerve deficit.     Motor: No weakness.  Psychiatric:  Mood and Affect: Mood normal.        Behavior: Behavior normal.      No results found for any visits on 10/03/23.      Assessment & Plan:  As per problem list  Problem List Items Addressed This Visit       Cardiovascular and Mediastinum   Hypertension   Relevant Medications   fenofibrate micronized (LOFIBRA) 134 MG capsule     Endocrine   Type 2 diabetes mellitus with peripheral neuropathy (HCC)   Relevant Medications   gabapentin (NEURONTIN) 800 MG tablet     Musculoskeletal and Integument   Lumbar spondylosis   Relevant Medications   gabapentin (NEURONTIN) 800 MG tablet   Other Visit Diagnoses     Seasonal allergic rhinitis due to pollen    -  Primary   Relevant Medications   fluticasone (FLONASE) 50 MCG/ACT nasal spray   cetirizine (ZYRTEC ALLERGY) 10 MG tablet   Mixed hyperlipidemia       Relevant Medications   fenofibrate micronized (LOFIBRA) 134 MG capsule       Return if symptoms worsen or fail to improve.   Total time spent: 20 minutes  Luna Fuse, MD  10/03/2023   This document may have been prepared by [facility] Voice Recognition software and as such may include unintentional dictation errors.

## 2023-10-04 DIAGNOSIS — Z9981 Dependence on supplemental oxygen: Secondary | ICD-10-CM | POA: Diagnosis not present

## 2023-10-04 DIAGNOSIS — F17210 Nicotine dependence, cigarettes, uncomplicated: Secondary | ICD-10-CM | POA: Diagnosis not present

## 2023-10-04 DIAGNOSIS — R0602 Shortness of breath: Secondary | ICD-10-CM | POA: Diagnosis not present

## 2023-10-04 DIAGNOSIS — J449 Chronic obstructive pulmonary disease, unspecified: Secondary | ICD-10-CM | POA: Diagnosis not present

## 2023-10-04 DIAGNOSIS — R0683 Snoring: Secondary | ICD-10-CM | POA: Diagnosis not present

## 2023-10-05 ENCOUNTER — Encounter: Payer: 59 | Admitting: Internal Medicine

## 2023-10-05 DIAGNOSIS — L97818 Non-pressure chronic ulcer of other part of right lower leg with other specified severity: Secondary | ICD-10-CM | POA: Diagnosis not present

## 2023-10-05 DIAGNOSIS — S80811D Abrasion, right lower leg, subsequent encounter: Secondary | ICD-10-CM | POA: Diagnosis not present

## 2023-10-05 DIAGNOSIS — J449 Chronic obstructive pulmonary disease, unspecified: Secondary | ICD-10-CM | POA: Diagnosis not present

## 2023-10-05 DIAGNOSIS — N1832 Chronic kidney disease, stage 3b: Secondary | ICD-10-CM | POA: Diagnosis not present

## 2023-10-05 DIAGNOSIS — F17210 Nicotine dependence, cigarettes, uncomplicated: Secondary | ICD-10-CM | POA: Diagnosis not present

## 2023-10-05 DIAGNOSIS — I87321 Chronic venous hypertension (idiopathic) with inflammation of right lower extremity: Secondary | ICD-10-CM | POA: Diagnosis not present

## 2023-10-05 DIAGNOSIS — I509 Heart failure, unspecified: Secondary | ICD-10-CM | POA: Diagnosis not present

## 2023-10-05 DIAGNOSIS — I130 Hypertensive heart and chronic kidney disease with heart failure and stage 1 through stage 4 chronic kidney disease, or unspecified chronic kidney disease: Secondary | ICD-10-CM | POA: Diagnosis not present

## 2023-10-05 DIAGNOSIS — S81801A Unspecified open wound, right lower leg, initial encounter: Secondary | ICD-10-CM | POA: Diagnosis not present

## 2023-10-05 DIAGNOSIS — E1122 Type 2 diabetes mellitus with diabetic chronic kidney disease: Secondary | ICD-10-CM | POA: Diagnosis not present

## 2023-10-06 DIAGNOSIS — I10 Essential (primary) hypertension: Secondary | ICD-10-CM | POA: Diagnosis not present

## 2023-10-06 DIAGNOSIS — K219 Gastro-esophageal reflux disease without esophagitis: Secondary | ICD-10-CM | POA: Diagnosis not present

## 2023-10-06 DIAGNOSIS — J439 Emphysema, unspecified: Secondary | ICD-10-CM | POA: Diagnosis not present

## 2023-10-06 DIAGNOSIS — R0602 Shortness of breath: Secondary | ICD-10-CM | POA: Diagnosis not present

## 2023-10-06 DIAGNOSIS — R000 Tachycardia, unspecified: Secondary | ICD-10-CM | POA: Diagnosis not present

## 2023-10-06 DIAGNOSIS — R0789 Other chest pain: Secondary | ICD-10-CM | POA: Diagnosis not present

## 2023-10-06 DIAGNOSIS — G894 Chronic pain syndrome: Secondary | ICD-10-CM | POA: Diagnosis not present

## 2023-10-06 DIAGNOSIS — F17209 Nicotine dependence, unspecified, with unspecified nicotine-induced disorders: Secondary | ICD-10-CM | POA: Diagnosis not present

## 2023-10-06 DIAGNOSIS — I5032 Chronic diastolic (congestive) heart failure: Secondary | ICD-10-CM | POA: Diagnosis not present

## 2023-10-06 DIAGNOSIS — I509 Heart failure, unspecified: Secondary | ICD-10-CM | POA: Diagnosis not present

## 2023-10-06 DIAGNOSIS — N1832 Chronic kidney disease, stage 3b: Secondary | ICD-10-CM | POA: Diagnosis not present

## 2023-10-07 ENCOUNTER — Other Ambulatory Visit: Payer: Self-pay

## 2023-10-07 DIAGNOSIS — J301 Allergic rhinitis due to pollen: Secondary | ICD-10-CM

## 2023-10-07 MED ORDER — LEVOTHYROXINE SODIUM 25 MCG PO TABS: 25.0000 ug | ORAL_TABLET | Freq: Every day | ORAL | 0 refills | Status: DC

## 2023-10-10 ENCOUNTER — Encounter: Payer: Self-pay | Admitting: Internal Medicine

## 2023-10-10 ENCOUNTER — Ambulatory Visit (INDEPENDENT_AMBULATORY_CARE_PROVIDER_SITE_OTHER): Payer: 59 | Admitting: Internal Medicine

## 2023-10-10 VITALS — BP 130/80 | HR 70 | Ht 66.0 in | Wt 185.2 lb

## 2023-10-10 DIAGNOSIS — E1142 Type 2 diabetes mellitus with diabetic polyneuropathy: Secondary | ICD-10-CM

## 2023-10-10 DIAGNOSIS — R159 Full incontinence of feces: Secondary | ICD-10-CM

## 2023-10-10 DIAGNOSIS — Z0130 Encounter for examination of blood pressure without abnormal findings: Secondary | ICD-10-CM

## 2023-10-10 LAB — POCT CBG (FASTING - GLUCOSE)-MANUAL ENTRY: Glucose Fasting, POC: 105 mg/dL — AB (ref 70–99)

## 2023-10-10 NOTE — Progress Notes (Signed)
place into trash. Cleanse the wound with Wound Cleanser prior to applying a clean dressing using gauze sponges, not tissues or cotton balls. Do not scrub or use excessive force. Pat dry using gauze sponges, not tissue or cotton balls. Prim Dressing: Hydrofera Blue Ready Transfer Foam, 4x5 (in/in) 1 x Per Week/30 Days ary Discharge Instructions: Apply Hydrofera Blue Ready to wound bed as directed Secondary Dressing: ABD Pad 5x9 (in/in) 1 x Per Week/30 Days Discharge Instructions: Cover with ABD pad Com pression Wrap: Urgo K2 Lite, two layer compression system, regular 1 x Per Week/30 Days 1. We are continuing with the same dressing 2. I suspect this will heal in 2 to 3 weeks Electronic Signature(Harrington) Signed: 10/06/2023 3:33:32 PM By: Baltazar Najjar MD Entered By: Baltazar Najjar on 10/05/2023 10:29:30 -------------------------------------------------------------------------------- SuperBill Details Patient Name: Date of Service: [name] 10/05/2023 Medical Record Number: [id number] Patient Account Number: [id number] Date of Birth/Sex: Treating RN: 05-06-1956 (67 y.o. [name] Primary Care Provider: Gillermo Harrington Other Clinician: Referring Provider: Treating Provider/Extender: [name], [name] [name], [name]: 3 Diagnosis Coding ICD-10 Codes Code Description [phone] Non-pressure chronic ulcer of other part of right lower leg with other specified  severity S80.811D Abrasion, right lower leg, subsequent encounter I87.321 Chronic venous hypertension (idiopathic) with inflammation of right lower extremity Facility Procedures : CPT4 Code: 21308657 Description: (Facility Use Only) [phone] - APPLY MULTLAY COMPRS LWR RT LEG Modifier: Quantity: 1 Physician Procedures : CPT4 Code Description Modifier [id number] - WC PHYS LEVEL 3 - EST PT ICD-10 Diagnosis Description L97.818 Non-pressure chronic ulcer of other part of right lower leg with other specified severity S80.811D Abrasion, right lower leg, subsequent  encounter [name] ([id number]) [id number] Page Quantity: 1 6 of 6 Electronic Signature(Harrington) Signed: 10/06/2023 3:33:32 PM By: Baltazar Najjar MD Entered By: Baltazar Najjar on 10/05/2023 10:29:49  [name] ([id number]) [id number] Page 1 of 6 Visit Report for 10/05/2023 HPI Details Patient Name: Date of Service: [name] 10/05/2023 12:45 PM Medical Record Number: [id number] Patient Account Number: [id number] Date of Birth/Sex: Treating RN: [birth year] (67 y.o. [name] Primary Care Provider: Gillermo Harrington Other Clinician: Referring Provider: Treating Provider/Extender: [name], [name] [name], [name]: 3 History of Present Illness HPI Description: ADMISSION 09/14/2023 This is a 67 year old woman whose problem began around 9 September she was stepping up onto a curb fell and suffered a skin tear substantially on the right lower leg. She was seen in by primary care given mupirocin and a course of cefadroxil. The cefadroxil was for 14 days. She is still applying the the mupirocin. She does not have a prior wound history in this leg although she probably has some degree of chronic venous insufficiency Past medical history includes type 2 diabetes in the chart although the patient claims she is prediabetic, chronic kidney disease stage IIIb, varicose veins followed previously by vascular vascular surgery, hypertension, headaches, CHF, COPD, gastroesophageal reflux disease, peripheral vascular disease and degenerative disc disease ABI in the right was 0.81 in our clinic. Patient is a 1-1/2 pack/day smoker 10/9; patient I admitted to the clinic last week suffering a substantial skin tear on the right lower leg. Some of this probably was a hematoma at the time. She has had wounds on her legs before usually traumatic that have healed last week we applied Hydrofera Blue with Urgo K2 light compression. She is tolerated this well and the wound is improved 10/16; patient with a substantial skin tear on the right lower leg in the setting of chronic venous insufficiency with probably an underlying hematoma at some point. We  have been using Hydrofera Blue under Urgo light compression. She is tolerating this well and we have substantial improvement 10/22; this wound continues to contract quite nicely. Skin tear on the right lateral lower leg in the setting of chronic venous insufficiency. We have been using Hydrofera Blue under and Urgo light compression. I think this will be healed in 2 to 3 weeks Electronic Signature(Harrington) Signed: 10/06/2023 3:33:32 PM By: Baltazar Najjar MD Entered By: Baltazar Najjar on 10/05/2023 10:27:25 -------------------------------------------------------------------------------- Physical Exam Details Patient Name: Date of Service: [name] 10/05/2023 12:45 PM Medical Record Number: [id number] Patient Account Number: [id number] Date of Birth/Sex: Treating RN: [birth year] (67 y.o. [name] Primary Care Provider: Gillermo Harrington Other Clinician: Referring Provider: Treating Provider/Extender: Chauncey Mann, [name] [name], [name]: 3 [name] ([id number]) [id number] Page 2 of 6 Constitutional Patient is hypertensive.. Pulse regular and within target range for patient.Marland Kitchen Respirations regular, non-labored and within target range.. Temperature is normal and within the target range for the patient.. Notes Wound exam; skin tear on the right anterior lower leg mid tibia. All of this wound is closed except for the most distal part. This looks healthy. Healthy granulation and advancing epithelialization. No evidence of infection Electronic Signature(Harrington) Signed: 10/06/2023 3:33:32 PM By: Baltazar Najjar MD Entered By: Baltazar Najjar on 10/05/2023 10:29:03 -------------------------------------------------------------------------------- Physician Orders Details Patient Name: Date of Service: JAZMIA, VANDIJK 10/05/2023 12:45 PM Medical Record Number: [id number] Patient Account Number: [id number] Date of Birth/Sex: Treating  RN: [birth year] (67 y.o. [name] Primary Care Provider: Gillermo Harrington Other Clinician: Referring Provider: Treating Provider/Extender: [name], [name] [name], [name]: 3 Verbal / Phone Orders: No Diagnosis  place into trash. Cleanse the wound with Wound Cleanser prior to applying a clean dressing using gauze sponges, not tissues or cotton balls. Do not scrub or use excessive force. Pat dry using gauze sponges, not tissue or cotton balls. Prim Dressing: Hydrofera Blue Ready Transfer Foam, 4x5 (in/in) 1 x Per Week/30 Days ary Discharge Instructions: Apply Hydrofera Blue Ready to wound bed as directed Secondary Dressing: ABD Pad 5x9 (in/in) 1 x Per Week/30 Days Discharge Instructions: Cover with ABD pad Com pression Wrap: Urgo K2 Lite, two layer compression system, regular 1 x Per Week/30 Days 1. We are continuing with the same dressing 2. I suspect this will heal in 2 to 3 weeks Electronic Signature(Harrington) Signed: 10/06/2023 3:33:32 PM By: Baltazar Najjar MD Entered By: Baltazar Najjar on 10/05/2023 10:29:30 -------------------------------------------------------------------------------- SuperBill Details Patient Name: Date of Service: [name] 10/05/2023 Medical Record Number: [id number] Patient Account Number: [id number] Date of Birth/Sex: Treating RN: 05-06-1956 (67 y.o. [name] Primary Care Provider: Gillermo Harrington Other Clinician: Referring Provider: Treating Provider/Extender: [name], [name] [name], [name]: 3 Diagnosis Coding ICD-10 Codes Code Description [phone] Non-pressure chronic ulcer of other part of right lower leg with other specified  severity S80.811D Abrasion, right lower leg, subsequent encounter I87.321 Chronic venous hypertension (idiopathic) with inflammation of right lower extremity Facility Procedures : CPT4 Code: 21308657 Description: (Facility Use Only) [phone] - APPLY MULTLAY COMPRS LWR RT LEG Modifier: Quantity: 1 Physician Procedures : CPT4 Code Description Modifier [id number] - WC PHYS LEVEL 3 - EST PT ICD-10 Diagnosis Description L97.818 Non-pressure chronic ulcer of other part of right lower leg with other specified severity S80.811D Abrasion, right lower leg, subsequent  encounter [name] ([id number]) [id number] Page Quantity: 1 6 of 6 Electronic Signature(Harrington) Signed: 10/06/2023 3:33:32 PM By: Baltazar Najjar MD Entered By: Baltazar Najjar on 10/05/2023 10:29:49  Coding Follow-up Appointments Return Appointment in 1 week. Bathing/ Shower/ Hygiene May shower with wound dressing protected with water repellent cover or cast protector. Anesthetic (Use 'Patient Medications' Section for Anesthetic Order Entry) Lidocaine applied to wound bed Edema Control - Lymphedema / Segmental Compressive Device / Other Elevate, Exercise Daily and A void Standing for Long Periods of Time. Elevate legs to the level of the heart and pump ankles as often as possible Elevate leg(Harrington) parallel to the floor when sitting. Wound Harrington Wound #1 - Lower Leg Wound Laterality: Right Cleanser: Soap and Water 1 x Per Week/30 Days Discharge Instructions: Gently cleanse wound with antibacterial soap, rinse and pat dry prior to dressing wounds Cleanser: Wound Cleanser 1 x Per Week/30 Days Discharge Instructions: Wash your hands with soap and water. Remove old dressing, discard into plastic bag and place into trash. Cleanse the wound with Wound Cleanser prior to applying a clean dressing using gauze sponges, not tissues or cotton balls. Do not scrub or use excessive force. Pat dry using gauze sponges, not tissue or cotton balls. Prim Dressing: Hydrofera Blue Ready Transfer Foam, 4x5 (in/in) 1 x Per Week/30 Days ary Discharge Instructions: Apply Hydrofera Blue Ready to wound bed as directed Secondary Dressing: ABD Pad 5x9 (in/in) 1 x Per Week/30 Days Discharge Instructions: Cover with ABD pad Compression Wrap: Urgo K2 Lite, two layer compression system, regular 1 x Per Week/30 Days Electronic Signature(Harrington) Signed: 10/06/2023 3:33:32 PM By: Baltazar Najjar MD Signed: 10/10/2023 8:00:25 AM By: Yevonne Pax RN Entered By: Yevonne Pax on  10/05/2023 09:56:44 Fiske, Haley Harrington ([id number]) [id number] Page 3 of 6 -------------------------------------------------------------------------------- Problem List Details Patient Name: Date of Service: GRACIN, ABRAMYAN 10/05/2023 12:45 PM Medical Record Number: [id number] Patient Account Number: [id number] Date of Birth/Sex: Treating RN: 1956/12/09 (67 y.o. [name] Primary Care Provider: Gillermo Harrington Other Clinician: Referring Provider: Treating Provider/Extender: [name], [name] [name], [name]: 3 Active Problems ICD-10 Encounter Code Description Active Date MDM Diagnosis L97.818 Non-pressure chronic ulcer of other part of right lower leg with other specified 09/14/2023 No Yes severity S80.811D Abrasion, right lower leg, subsequent encounter 09/14/2023 No Yes I87.321 Chronic venous hypertension (idiopathic) with inflammation of right lower 09/14/2023 No Yes extremity Inactive Problems Resolved Problems Electronic Signature(Harrington) Signed: 10/06/2023 3:33:32 PM By: Baltazar Najjar MD Entered By: Baltazar Najjar on 10/05/2023 10:26:43 -------------------------------------------------------------------------------- Progress Note Details Patient Name: Date of Service: [name] 10/05/2023 12:45 PM Medical Record Number: [id number] Patient Account Number: [id number] Date of Birth/Sex: Treating RN: 1956/12/10 (67 y.o. [name] Primary Care Provider: Gillermo Harrington Other Clinician: Referring Provider: Treating Provider/Extender: Chauncey Mann, [name] [name], [name]: 3 [place], Jillayne Harrington ([id number]) [id number] Page 4 of 6 Subjective History of Present Illness (HPI) ADMISSION 09/14/2023 This is a 67 year old woman whose problem began around 9 September she was stepping up onto a curb fell and suffered a skin tear substantially on the right lower leg. She was seen  in by primary care given mupirocin and a course of cefadroxil. The cefadroxil was for 14 days. She is still applying the the mupirocin. She does not have a prior wound history in this leg although she probably has some degree of chronic venous insufficiency Past medical history includes type 2 diabetes in the chart although the patient claims she is prediabetic, chronic kidney disease stage IIIb, varicose veins followed previously by vascular vascular surgery, hypertension, headaches, CHF, COPD, gastroesophageal reflux disease, peripheral vascular disease and

## 2023-10-10 NOTE — Progress Notes (Signed)
diagnoseso) 0 No Ambulatory aid None/bed rest/wheelchair/nurse 0 Yes Crutches/cane/walker 0 No Furniture 0 No Intravenous therapy Access/Saline/Heparin Lock 0 No Gait/Transferring Normal/ bed rest/ wheelchair 0 Yes Weak (short steps with or without shuffle, stooped but able to lift head while walking, may seek 0 No support from furniture) Impaired (short steps with shuffle, may have difficulty arising from chair, head down, impaired 0 No balance) Mental Status Oriented to own ability 0 Yes Electronic Signature(s) Signed: 10/10/2023 8:01:26 AM By: Yevonne Pax RN Entered By: Yevonne Pax on 09/14/2023 10:59:28 -------------------------------------------------------------------------------- Foot Assessment Details Patient Name: Date of Service: [name] 09/14/2023 1:30 PM Medical Record Number: [id number] Patient Account Number: [id number] Date of Birth/Sex: Treating RN: 05-29-1956 (67 y.o. [name] Primary Care Maeven Mcdougall: Gillermo Murdoch Other Clinician: Referring Madine Sarr: Treating Shunte Senseney/Extender: [name], [name] [name], [name]: 0 Foot Assessment Items Site Locations KRISSANDRA, MOTTL ([id number]) [id number] Page 4 of 5 + = Sensation present, - = Sensation absent, C = Callus, U = Ulcer R = Redness, W =  Warmth, M = Maceration, PU = Pre-ulcerative lesion F = Fissure, S = Swelling, D = Dryness Assessment Right: Left: Other Deformity: No No Prior Foot Ulcer: No No Prior Amputation: No No Charcot Joint: No No Ambulatory Status: Ambulatory Without Help Gait: Steady Electronic Signature(s) Signed: 10/10/2023 8:01:26 AM By: Yevonne Pax RN Entered By: Yevonne Pax on 09/14/2023 11:09:13 -------------------------------------------------------------------------------- Nutrition Risk Screening Details Patient Name: Date of Service: JOEANN, HAAR 09/14/2023 1:30 PM Medical Record Number: [id number] Patient Account Number: [id number] Date of Birth/Sex: Treating RN: 1956-05-22 (67 y.o. [name] Primary Care Cricket Goodlin: Gillermo Murdoch Other Clinician: Referring Sandia Pfund: Treating Marthann Abshier/Extender: [name], [name] [name], [name]: 0 Height (in): 66 Weight (lbs): 188 Body Mass Index (BMI): 30.3 Nutrition Risk Screening Items Score Screening NUTRITION RISK SCREEN: I have an illness or condition that made me change the kind and/or amount of food I eat 2 Yes I eat fewer than two meals per day 0 No I eat few fruits and vegetables, or milk products 0 No I have three or more drinks of beer, liquor or wine almost every day 0 No I have tooth or mouth problems that make it hard for me to eat 0 No I don't always have enough money to buy the food I need 0 No KEMYA, PHILBERT S ([id number]) [id number] Page 5 of 5 I eat alone most of the time 1 Yes I take three or more different prescribed or over-the-counter drugs a day 0 No Without wanting to, I have lost or gained 10 pounds in the last six months 0 No I am not always physically able to shop, cook and/or feed myself 0 No Nutrition Protocols Good Risk Protocol Moderate Risk Protocol 0 Provide education on nutrition High Risk Proctocol Risk Level: Moderate Risk Score: 3 Electronic  Signature(s) Signed: 10/10/2023 8:01:26 AM By: Yevonne Pax RN Entered By: Yevonne Pax on 09/14/2023 10:59:44  MEHNAZ, KNACK ([id number]) [id number] Page 1 of 5 Visit Report for 09/14/2023 Abuse Risk Screen Details Patient Name: Date of Service: CONNIE, MURRIETTA 09/14/2023 1:30 PM Medical Record Number: [id number] Patient Account Number: [id number] Date of Birth/Sex: Treating RN: 1956-09-01 (67 y.o. [name] Primary Care Ingeborg Fite: Gillermo Murdoch Other Clinician: Referring Kohen Reither: Treating Annya Lizana/Extender: [name], [name] [name], [name]: 0 Abuse Risk Screen Items Answer ABUSE RISK SCREEN: Has anyone close to you tried to hurt or harm you recentlyo No Do you feel uncomfortable with anyone in your familyo No Has anyone forced you do things that you didnt want to doo No Electronic Signature(s) Signed: 10/10/2023 8:01:26 AM By: Yevonne Pax RN Entered By: Yevonne Pax on 09/14/2023 10:58:30 -------------------------------------------------------------------------------- Activities of Daily Living Details Patient Name: Date of Service: KADEEJA, RISTINE 09/14/2023 1:30 PM Medical Record Number: [id number] Patient Account Number: [id number] Date of Birth/Sex: Treating RN: [birth year] (67 y.o. [name] Primary Care Arika Mainer: Gillermo Murdoch Other Clinician: Referring Kiet Geer: Treating Josua Ferrebee/Extender: [name], [name] [name], [name]: 0 Activities of Daily Living Items Answer Activities of Daily Living (Please select one for each item) Drive Automobile Completely Able T Medications ake Completely Able Use T elephone Completely Able Care for Appearance Completely Able Use T oilet Completely Able Bath / Shower Completely Able Dress Self Completely Able Feed Self Completely Able Walk Completely Able Get In / Out Bed Completely Able Housework Completely WINNELL, CALDERARO ([id number]) [id number] [id number] Page 2 of 5 Prepare Meals Completely Able Handle Money  Completely Able Shop for Self Completely Able Electronic Signature(s) Signed: 10/10/2023 8:01:26 AM By: Yevonne Pax RN Entered By: Yevonne Pax on 09/14/2023 10:58:50 -------------------------------------------------------------------------------- Education Screening Details Patient Name: Date of Service: [name] 09/14/2023 1:30 PM Medical Record Number: [id number] Patient Account Number: [id number] Date of Birth/Sex: Treating RN: [birth year] (67 y.o. [name] Primary Care Francene Mcerlean: Gillermo Murdoch Other Clinician: Referring Belton Peplinski: Treating Kaycee Mcgaugh/Extender: [name], [name] [name], [name]: 0 Primary Learner Assessed: Patient Learning Preferences/Education Level/Primary Language Learning Preference: Explanation Highest Education Level: High School Preferred Language: English Cognitive Barrier Language Barrier: No Translator Needed: No Memory Deficit: No Emotional Barrier: No Cultural/Religious Beliefs Affecting Medical Care: No Physical Barrier Impaired Vision: No Impaired Hearing: No Decreased Hand dexterity: No Knowledge/Comprehension Knowledge Level: Medium Comprehension Level: High Ability to understand written instructions: High Ability to understand verbal instructions: High Motivation Anxiety Level: Anxious Cooperation: Cooperative Education Importance: Acknowledges Need Interest in Health Problems: Asks Questions Perception: Coherent Willingness to Engage in Self-Management Medium Activities: Readiness to Engage in Self-Management Medium Activities: Electronic Signature(s) Signed: 10/10/2023 8:01:26 AM By: Yevonne Pax RN Entered By: Yevonne Pax on 09/14/2023 10:59:16 [name] ([id number]) [id number] Page 3 of 5 -------------------------------------------------------------------------------- Fall Risk Assessment Details Patient Name: Date of Service: WAREESHA, GETSON  09/14/2023 1:30 PM Medical Record Number: [id number] Patient Account Number: [id number] Date of Birth/Sex: Treating RN: 1956/08/06 (67 y.o. [name] Primary Care Daniil Labarge: Gillermo Murdoch Other Clinician: Referring Makenzie Weisner: Treating Takyla Kuchera/Extender: [name], [name] [name], [name]: 0 Fall Risk Assessment Items Have you had 2 or more falls in the last 12 monthso 0 Yes Have you had any fall that resulted in injury in the last 12 monthso 0 Yes FALLS RISK SCREEN History of falling - immediate or within 3 months 25 Yes Secondary diagnosis (Do you have 2 or more medical

## 2023-10-10 NOTE — Progress Notes (Signed)
other part of right lower leg with other specified 09/14/2023 No Yes severity S80.811D Abrasion, right lower leg, subsequent encounter 09/14/2023 No Yes I87.321 Chronic venous hypertension (idiopathic) with inflammation of right lower 09/14/2023 No Yes extremity [name], [name] ([id number]) [id number] Page 5 of 8 Inactive Problems Resolved Problems Electronic Signature(Harrington) Signed: 09/14/2023 3:47:50 PM By: Baltazar Najjar MD Entered By: Baltazar Najjar on 09/14/2023 12:37:11 -------------------------------------------------------------------------------- Progress Note Details Patient Name: Date of Service: [name]. 09/14/2023 1:30 PM Medical Record Number: [id number] Patient Account Number: [id number] Date of Birth/Sex: Treating RN: January 06, 1956 (67 y.o. [name] Primary Care Provider: Gillermo Murdoch Other Clinician: Referring Provider: Treating Provider/Extender: [name], [name] [name],  [name]: 0 Subjective Chief Complaint Information obtained from Patient 09/14/2023; patient is here for review of wound on the right anterior lateral mid tibia area. History of Present Illness (HPI) ADMISSION 09/14/2023 This is a 67 year old woman whose problem began around 9 September she was stepping up onto a curb fell and suffered a skin tear substantially on the right lower leg. She was seen in by primary care given mupirocin and a course of cefadroxil. The cefadroxil was for 14 days. She is still applying the the mupirocin. She does not have a prior wound history in this leg although she probably has some degree of chronic venous insufficiency Past medical history includes type 2 diabetes in the chart although the patient claims she is prediabetic, chronic kidney disease stage IIIb, varicose veins followed previously by vascular vascular surgery, hypertension, headaches, CHF, COPD, gastroesophageal reflux disease, peripheral vascular disease and degenerative disc disease ABI in the right was 0.81 in our clinic. Patient is a 1-1/2 pack/day smoker Patient History Allergies aspirin, Tylenol, NSAIDS (Non-Steroidal Anti-Inflammatory Drug), hydrocodone, Thiazides Social History Current every day smoker, Marital Status - Widowed, Alcohol Use - Rarely, Drug Use - No History, Caffeine Use - Daily. Medical History Respiratory Patient has history of Chronic Obstructive Pulmonary Disease (COPD) Cardiovascular Patient has history of Congestive Heart Failure, Hypertension, Peripheral Venous Disease Endocrine Patient has history of Type II Diabetes Neurologic Patient has history of Seizure Disorder Patient is treated with Controlled Diet. Review of Systems (ROS) Genitourinary CKD 3 Integumentary (Skin) Complains or has symptoms of Wounds, Swelling. [name], [name] ([id number]) [id number] Page 6 of 8 Objective Constitutional Sitting or standing  Blood Pressure is within target range for patient.. Pulse regular and within target range for patient.Marland Kitchen Respirations regular, non-labored and within target range.. Temperature is normal and within the target range for the patient.Marland Kitchen appears in no distress. Vitals Time Taken: 1:55 PM, Height: 66 in, Source: Stated, Weight: 188 lbs, Source: Stated, BMI: 30.3, Temperature: 98.2 F, Pulse: 76 bpm, Respiratory Rate: 18 breaths/min, Blood Pressure: 146/76 mmHg. Respiratory Respiratory effort is easy and symmetric bilaterally. Rate is normal at rest and on room air.. Cardiovascular Pedal pulses are palpable on the right. General Notes: Wound exam; fairly sizable skin tear on the right anterior mid tibial area. Distally the area is black. I used a #5 curette to debride this probably some degree of residual hematoma. No evidence of infection. There is some swelling around the wound. As noted probably some skin changes suggestive of chronic venous hypertension. Integumentary (Hair, Skin) Wound #1 status is Open. Original cause of wound was Trauma. The date acquired was: 08/30/2023. The wound is located on the Right Lower Leg. The wound measures 9cm length x 4cm width x 0.3cm depth; 28.274cm^2 area and 8.482cm^3 volume. There is Fat Layer (  [name] ([id number]) [id number] Page 1 of 8 Visit Report for 09/14/2023 Chief Complaint Document Details Patient Name: Date of Service: [name] 09/14/2023 1:30 PM Medical Record Number: [id number] Patient Account Number: [id number] Date of Birth/Sex: Treating RN: [birth year] (67 y.o. [name] Primary Care Provider: Gillermo Murdoch Other Clinician: Referring Provider: Treating Provider/Extender: [name], [name] [name], [name]: 0 Information Obtained from: Patient Chief Complaint 09/14/2023; patient is here for review of wound on the right anterior lateral mid tibia area. Electronic Signature(Harrington) Signed: 09/14/2023 3:47:50 PM By: Baltazar Najjar MD Entered By: Baltazar Najjar on 09/14/2023 11:44:25 -------------------------------------------------------------------------------- Debridement Details Patient Name: Date of Service: [name] 09/14/2023 1:30 PM Medical Record Number: [id number] Patient Account Number: [id number] Date of Birth/Sex: Treating RN: 1956/03/05 (67 y.o. [name] Primary Care Provider: Gillermo Murdoch Other Clinician: Referring Provider: Treating Provider/Extender: [name], [name] [name], [name]: 0 Debridement Performed for Assessment: Wound #1 Right Lower Leg Performed By: Physician Maxwell Caul, MD Debridement Type: Debridement Severity of Tissue Pre Debridement: Fat layer exposed Level of Consciousness (Pre-procedure): Awake and Alert Pre-procedure Verification/Time Out Yes - 14:30 Taken: Start Time: 14:30 Pain Control: Lidocaine Percent of Wound Bed Debrided: 50% T Area Debrided (cm): otal 14.13 Tissue and other material debrided: Viable, Non-Viable, Slough, Subcutaneous, Skin: Dermis , Skin: Epidermis, Slough Level: Skin/Subcutaneous Tissue Debridement Description: Excisional Instrument: Curette Bleeding: Moderate Hemostasis Achieved:  Pressure End Time: 14:35 Procedural Pain: 0 [name], [name] ([id number]) [id number] Page 2 of 8 Post Procedural Pain: 0 Response to Treatment: Procedure was tolerated well Level of Consciousness (Post- Awake and Alert procedure): Post Debridement Measurements of Total Wound Length: (cm) 9 Width: (cm) 4 Depth: (cm) 0.3 Volume: (cm) 8.482 Character of Wound/Ulcer Post Debridement: Improved Severity of Tissue Post Debridement: Fat layer exposed Post Procedure Diagnosis Same as Pre-procedure Electronic Signature(Harrington) Signed: 09/14/2023 3:47:50 PM By: Baltazar Najjar MD Signed: 10/10/2023 8:01:26 AM By: Yevonne Pax RN Previous Signature: 09/14/2023 3:20:31 PM Version By: Yevonne Pax RN Entered By: Baltazar Najjar on 09/14/2023 12:35:51 -------------------------------------------------------------------------------- HPI Details Patient Name: Date of Service: [name]. 09/14/2023 1:30 PM Medical Record Number: [id number] Patient Account Number: [id number] Date of Birth/Sex: Treating RN: [birth year] (67 y.o. [name] Primary Care Provider: Gillermo Murdoch Other Clinician: Referring Provider: Treating Provider/Extender: [name], [name] [name], [name]: 0 History of Present Illness HPI Description: ADMISSION 09/14/2023 This is a 67 year old woman whose problem began around 9 September she was stepping up onto a curb fell and suffered a skin tear substantially on the right lower leg. She was seen in by primary care given mupirocin and a course of cefadroxil. The cefadroxil was for 14 days. She is still applying the the mupirocin. She does not have a prior wound history in this leg although she probably has some degree of chronic venous insufficiency Past medical history includes type 2 diabetes in the chart although the patient claims she is prediabetic, chronic kidney disease stage IIIb, varicose veins followed previously by  vascular vascular surgery, hypertension, headaches, CHF, COPD, gastroesophageal reflux disease, peripheral vascular disease and degenerative disc disease ABI in the right was 0.81 in our clinic. Patient is a 1-1/2 pack/day smoker Electronic Signature(Harrington) Signed: 09/14/2023 3:47:50 PM By: Baltazar Najjar MD Entered By: Baltazar Najjar on 09/14/2023 11:46:21 [name] ([id number]) [id number] Page 3 of 8 -------------------------------------------------------------------------------- Physical Exam Details Patient Name: Date of Service: [name], [name].  09/14/2023 1:30 PM Medical Record Number: [id number] Patient Account Number: [id number] Date of Birth/Sex: Treating RN: Dec 07, 1956 (67 y.o. [name] Primary Care Provider: Gillermo Murdoch Other Clinician: Referring Provider: Treating Provider/Extender: [name], [name] [name], [name]: 0 Constitutional Sitting or standing Blood Pressure is within target range for patient.. Pulse regular and within target range for patient.Marland Kitchen Respirations regular, non-labored and within target range.. Temperature is normal and within the target range for the patient.Marland Kitchen appears in no distress. Respiratory Respiratory effort is easy and symmetric bilaterally. Rate is normal at rest and on room air.. Cardiovascular Pedal pulses are palpable on the right. Integumentary (Hair, Skin) . Notes Wound exam; fairly sizable skin tear on the right anterior mid tibial area. Distally the area is black. I used a #5 curette to debride this probably some degree of residual hematoma. No evidence of infection. There is some swelling around the wound. As noted probably some skin changes suggestive of chronic venous hypertension. Electronic Signature(Harrington) Signed: 09/14/2023 3:47:50 PM By: Baltazar Najjar MD Entered By: Baltazar Najjar on 09/14/2023  11:50:08 -------------------------------------------------------------------------------- Physician Orders Details Patient Name: Date of Service: [name] 09/14/2023 1:30 PM Medical Record Number: [id number] Patient Account Number: [id number] Date of Birth/Sex: Treating RN: October 16, 1956 (67 y.o. [name] Primary Care Provider: Gillermo Murdoch Other Clinician: Referring Provider: Treating Provider/Extender: [name], [name] [name], [name]: 0 Verbal / Phone Orders: No Diagnosis Coding Follow-up Appointments Return Appointment in 1 week. Bathing/ Shower/ Hygiene May shower with wound dressing protected with water repellent cover or cast protector. [name], [name] ([id number]) [id number] Page 4 of 8 Anesthetic (Use 'Patient Medications' Section for Anesthetic Order Entry) Lidocaine applied to wound bed Edema Control - Lymphedema / Segmental Compressive Device / Other Elevate, Exercise Daily and A void Standing for Long Periods of Time. Elevate legs to the level of the heart and pump ankles as often as possible Elevate leg(Harrington) parallel to the floor when sitting. Wound Treatment Wound #1 - Lower Leg Wound Laterality: Right Cleanser: Soap and Water 1 x Per Week/30 Days Discharge Instructions: Gently cleanse wound with antibacterial soap, rinse and pat dry prior to dressing wounds Cleanser: Wound Cleanser 1 x Per Week/30 Days Discharge Instructions: Wash your hands with soap and water. Remove old dressing, discard into plastic bag and place into trash. Cleanse the wound with Wound Cleanser prior to applying a clean dressing using gauze sponges, not tissues or cotton balls. Do not scrub or use excessive force. Pat dry using gauze sponges, not tissue or cotton balls. Prim Dressing: Hydrofera Blue Ready Transfer Foam, 4x5 (in/in) 1 x Per Week/30 Days ary Discharge Instructions: Apply Hydrofera Blue Ready to wound bed as  directed Secondary Dressing: ABD Pad 5x9 (in/in) 1 x Per Week/30 Days Discharge Instructions: Cover with ABD pad Compression Wrap: Urgo K2 Lite, two layer compression system, regular 1 x Per Week/30 Days Electronic Signature(Harrington) Signed: 09/14/2023 3:47:50 PM By: Baltazar Najjar MD Signed: 10/10/2023 8:01:26 AM By: Yevonne Pax RN Entered By: Yevonne Pax on 09/14/2023 11:38:49 -------------------------------------------------------------------------------- Problem List Details Patient Name: Date of Service: [name]. 09/14/2023 1:30 PM Medical Record Number: [id number] Patient Account Number: [id number] Date of Birth/Sex: Treating RN: 1956/03/25 (67 y.o. 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 There is no tunneling or undermining noted. There is a medium amount of serosanguineous drainage noted. There is medium (34-66%) red granulation within the wound bed. There is a medium (34-66%) amount of necrotic tissue within the wound bed including Eschar and Adherent Slough. 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 Bathing/ Shower/ Hygiene: May shower with wound dressing protected with  water repellent cover or cast protector. Anesthetic (Use 'Patient Medications' Section for Anesthetic Order Entry): Lidocaine applied to wound bed Edema Control - Lymphedema / Segmental Compressive Device / Other: Elevate, Exercise Daily and Avoid Standing for Long Periods of Time. Elevate legs to the level of the heart and pump ankles as often as possible Elevate leg(Harrington) parallel to the floor when sitting. WOUND #1: - Lower Leg Wound Laterality: Right Cleanser: Soap and Water 1 x Per Week/30 Days Discharge Instructions: Gently cleanse wound with antibacterial soap, rinse and pat dry prior to dressing wounds Cleanser: Wound Cleanser 1 x Per Week/30 Days Discharge Instructions: Wash your hands with soap and water. Remove old dressing, discard into plastic bag and place into trash. Cleanse the wound with Wound Cleanser prior to applying a clean dressing using gauze sponges, not tissues or cotton balls. Do not scrub or use excessive force. Pat dry using gauze sponges, not tissue or cotton balls. Prim Dressing: Hydrofera Blue Ready Transfer Foam, 4x5 (in/in) 1 x Per Week/30 Days ary Discharge Instructions: Apply Hydrofera Blue Ready to wound bed as directed Secondary Dressing: ABD Pad 5x9 (in/in) 1 x Per Week/30 Days Discharge Instructions: Cover with ABD pad Com pression Wrap: Urgo K2 Lite, two layer compression system, regular 1 x Per Week/30 Days 1. Significant abrasion on the right lower leg in the setting of some degree of chronic venous insufficiency 2. The patient is a heavy smoker and has diabetes. Her ABI was slightly reduced at 0.81 but her pedal pulses are palpable. I think she can handle light compression without any concern 3. Debridement of the lower part of the wound because of black eschar suggesting residual hematoma I think there was probably some of this but certainly not having any major depth 4. We are going to apply Hydrofera Blue/ABD to the wound I put her in Urgo K2 lite  wrap. Hope to see this again next week. Notable for the fact the patient has Medicaid and there are not a lot of options for dressing the wound like this Electronic Signature(Harrington) [name], [name] ([id number]) [id number] Page 7 of 8 Signed: 09/14/2023 3:47:50 PM By: Baltazar Najjar MD Entered By: Baltazar Najjar on 09/14/2023 11:52:08 -------------------------------------------------------------------------------- ROS/PFSH Details Patient Name: Date of Service: [name]. 09/14/2023 1:30 PM Medical Record Number: [id number] Patient Account Number: [id number] Date of Birth/Sex: Treating RN: [birth year] (67 y.o. [name] Primary Care Provider: Gillermo Murdoch Other Clinician: Referring Provider: Treating Provider/Extender: [name], [name] G Tejan-Sie, [name]: 0 Integumentary (Skin) Complaints and Symptoms: Positive for: Wounds; Swelling Respiratory Medical History: Positive for: Chronic Obstructive Pulmonary Disease (COPD) Cardiovascular Medical History: Positive for: Congestive Heart Failure; Hypertension; Peripheral Venous Disease Endocrine Medical History: Positive for: Type II Diabetes Treated with: Diet Genitourinary Complaints and Symptoms: Review of System Notes: CKD 3 Neurologic Medical History: Positive for: Seizure Disorder Immunizations Pneumococcal Vaccine: Received Pneumococcal Vaccination: No Implantable Devices None Family  and Social History Current every day smoker; Marital Status - Widowed; Alcohol Use: Rarely; Drug Use: No History; Caffeine Use: Daily Electronic Signature(Harrington) Signed: 09/14/2023 3:47:50 PM By: Baltazar Najjar MD Signed: 10/10/2023 8:01:26 AM By: Yevonne Pax RN Entered By: Yevonne Pax on 09/14/2023 10:58:23 [name], [name] ([id number]) [id number] Page 8 of 8 -------------------------------------------------------------------------------- SuperBill  Details Patient Name: Date of Service: [name], [name] 09/14/2023 Medical Record Number: [id number] Patient Account Number: [id number] Date of Birth/Sex: Treating RN: July 17, 1956 (67 y.o. [name] Primary Care Provider: Gillermo Murdoch Other Clinician: Referring Provider: Treating Provider/Extender: [name], [name] [name], [name]: 0 Diagnosis Coding ICD-10 Codes Code Description [phone] Non-pressure chronic ulcer of other part of right lower leg with other specified severity S80.811D Abrasion, right lower leg, subsequent encounter I87.321 Chronic venous hypertension (idiopathic) with inflammation of right lower extremity Facility Procedures : CPT4 Code: [id number] Description: 99214 - WOUND CARE VISIT-LEV 4 EST PT Modifier: Quantity: 1 : CPT4 Code: 20254270 Description: 11042 - DEB SUBQ TISSUE 20 SQ CM/< ICD-10 Diagnosis Description L97.818 Non-pressure chronic ulcer of other part of right lower leg with other specified Modifier: severity Quantity: 1 Physician Procedures : CPT4 Code Description Modifier [id number] WC PHYS LEVEL 3 NEW PT 25 ICD-10 Diagnosis Description L97.818 Non-pressure chronic ulcer of other part of right lower leg with other specified severity S80.811D Abrasion, right lower leg, subsequent encounter  I87.321 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

## 2023-10-10 NOTE — Progress Notes (Signed)
Wound Tracing (instead of photographs) X- 1 5 Simple Wound Measurement - one wound []  - 0 Complex Wound Measurement - multiple wounds X- 1 5 Simple Wound Cleansing - one wound []  - 0 Complex Wound Cleansing - multiple wounds INTERVENTIONS - Wound Dressings X - Small Wound Dressing one or multiple wounds 1 10 []  - 0 Medium Wound Dressing one or multiple wounds []  - 0 Large Wound Dressing one or multiple wounds []  - 0 Application of Medications - injection INTERVENTIONS - Miscellaneous []  - 0 External ear exam []  - 0 Specimen Collection (cultures, biopsies, blood, body fluids, etc.) []  - 0 Specimen(s) / Culture(s) sent or taken to Lab for analysis []  - 0 Patient Transfer (multiple staff / Nurse, adult / Similar devices) []  - 0 Simple Staple / Suture removal (25 or less) []  - 0 Complex Staple / Suture removal (26 or more) []  - 0 Hypo / Hyperglycemic Management (close monitor of Blood Glucose) X- 1 15 Ankle / Brachial Index (ABI) - do not check if billed separately Has the patient been seen at the hospital within the last three years: Yes Total Score: 130 Level Of Care: New/Established - Level 4 Electronic Signature(s) Signed: 10/10/2023 8:01:26 AM By: Yevonne Pax RN Entered By: Yevonne Pax on 09/14/2023 11:38:04 -------------------------------------------------------------------------------- Encounter Discharge Information Details Patient Name: Date of Service: [name]. 09/14/2023 1:30 PM Medical Record Number: [id number] Patient Account Number: [id number] Date of Birth/Sex: Treating RN: 06-Sep-1956  (67 y.o. [name] Primary Care Kendyl Bissonnette: Gillermo Murdoch Other Clinician: Referring Makeila Yamaguchi: Treating Zekiah Caruth/Extender: [name], [name] [name], [name]: 0 Encounter Discharge Information Items Discharge Condition: Stable Ambulatory Status: Ambulatory [name], [name] ([id number]) [id number] Page 4 of 10 Discharge Destination: Home Transportation: Private Auto Accompanied By: self Schedule Follow-up Appointment: Yes Clinical Summary of Care: Electronic Signature(s) Signed: 10/10/2023 8:01:26 AM By: Yevonne Pax RN Entered By: Yevonne Pax on 09/14/2023 11:39:36 -------------------------------------------------------------------------------- Lower Extremity Assessment Details Patient Name: Date of Service: [name], [name] 09/14/2023 1:30 PM Medical Record Number: [id number] Patient Account Number: [id number] Date of Birth/Sex: Treating RN: 07-Oct-1956 (66 y.o. [name] Primary Care Orlyn Odonoghue: Gillermo Murdoch Other Clinician: Referring Filmore Molyneux: Treating Lennex Pietila/Extender: [name], [name] [name], [name]: 0 Edema Assessment Assessed: [Left: No] [Right: No] Edema: [Left: Ye] [Right: s] Calf Left: Right: Point of Measurement: 34 cm From Medial Instep 32 cm Ankle Left: Right: Point of Measurement: 9 cm From Medial Instep 22 cm Knee To Floor Left: Right: From Medial Instep 46 cm Vascular Assessment Pulses: Dorsalis Pedis Palpable: [Right:Yes] Doppler Audible: [Right:Yes] Extremity colors, hair growth, and conditions: Extremity Color: [Right:Hyperpigmented] Hair Growth on Extremity: [Right:No] Temperature of Extremity: [Right:Warm] Capillary Refill: [Right:< 3 seconds] Dependent Rubor: [Right:No] Blanched when Elevated: [Right:No] Lipodermatosclerosis: [Right:No] Blood Pressure: Brachial: [Right:146] Ankle: [Right:Dorsalis Pedis: 118 0.81] Toe Nail Assessment Left:  Right: Thick: Yes DiscoloredKIMMBERLY, [name] ([id number]) [id number] Page 5 of 10 Deformed: Yes Improper Length and Hygiene: Yes Electronic Signature(s) Signed: 10/10/2023 8:01:26 AM By: Yevonne Pax RN Entered By: Yevonne Pax on 09/14/2023 11:12:12 -------------------------------------------------------------------------------- Multi Wound Chart Details Patient Name: Date of Service: [name]. 09/14/2023 1:30 PM Medical Record Number: [id number] Patient Account Number: [id number] Date of Birth/Sex: Treating RN: 19-Feb-1956 (67 y.o. [name] Primary Care Leiah Giannotti: Gillermo Murdoch Other Clinician: Referring Velena Keegan: Treating Pradeep Beaubrun/Extender: [name], [name] [name], [name]: 0 Vital Signs Height(in): 66 Pulse(bpm): 76 Weight(lbs): 188 Blood Pressure(mmHg):  Wound Tracing (instead of photographs) X- 1 5 Simple Wound Measurement - one wound []  - 0 Complex Wound Measurement - multiple wounds X- 1 5 Simple Wound Cleansing - one wound []  - 0 Complex Wound Cleansing - multiple wounds INTERVENTIONS - Wound Dressings X - Small Wound Dressing one or multiple wounds 1 10 []  - 0 Medium Wound Dressing one or multiple wounds []  - 0 Large Wound Dressing one or multiple wounds []  - 0 Application of Medications - injection INTERVENTIONS - Miscellaneous []  - 0 External ear exam []  - 0 Specimen Collection (cultures, biopsies, blood, body fluids, etc.) []  - 0 Specimen(s) / Culture(s) sent or taken to Lab for analysis []  - 0 Patient Transfer (multiple staff / Nurse, adult / Similar devices) []  - 0 Simple Staple / Suture removal (25 or less) []  - 0 Complex Staple / Suture removal (26 or more) []  - 0 Hypo / Hyperglycemic Management (close monitor of Blood Glucose) X- 1 15 Ankle / Brachial Index (ABI) - do not check if billed separately Has the patient been seen at the hospital within the last three years: Yes Total Score: 130 Level Of Care: New/Established - Level 4 Electronic Signature(s) Signed: 10/10/2023 8:01:26 AM By: Yevonne Pax RN Entered By: Yevonne Pax on 09/14/2023 11:38:04 -------------------------------------------------------------------------------- Encounter Discharge Information Details Patient Name: Date of Service: [name]. 09/14/2023 1:30 PM Medical Record Number: [id number] Patient Account Number: [id number] Date of Birth/Sex: Treating RN: 06-Sep-1956  (67 y.o. [name] Primary Care Kendyl Bissonnette: Gillermo Murdoch Other Clinician: Referring Makeila Yamaguchi: Treating Zekiah Caruth/Extender: [name], [name] [name], [name]: 0 Encounter Discharge Information Items Discharge Condition: Stable Ambulatory Status: Ambulatory [name], [name] ([id number]) [id number] Page 4 of 10 Discharge Destination: Home Transportation: Private Auto Accompanied By: self Schedule Follow-up Appointment: Yes Clinical Summary of Care: Electronic Signature(s) Signed: 10/10/2023 8:01:26 AM By: Yevonne Pax RN Entered By: Yevonne Pax on 09/14/2023 11:39:36 -------------------------------------------------------------------------------- Lower Extremity Assessment Details Patient Name: Date of Service: [name], [name] 09/14/2023 1:30 PM Medical Record Number: [id number] Patient Account Number: [id number] Date of Birth/Sex: Treating RN: 07-Oct-1956 (66 y.o. [name] Primary Care Orlyn Odonoghue: Gillermo Murdoch Other Clinician: Referring Filmore Molyneux: Treating Lennex Pietila/Extender: [name], [name] [name], [name]: 0 Edema Assessment Assessed: [Left: No] [Right: No] Edema: [Left: Ye] [Right: s] Calf Left: Right: Point of Measurement: 34 cm From Medial Instep 32 cm Ankle Left: Right: Point of Measurement: 9 cm From Medial Instep 22 cm Knee To Floor Left: Right: From Medial Instep 46 cm Vascular Assessment Pulses: Dorsalis Pedis Palpable: [Right:Yes] Doppler Audible: [Right:Yes] Extremity colors, hair growth, and conditions: Extremity Color: [Right:Hyperpigmented] Hair Growth on Extremity: [Right:No] Temperature of Extremity: [Right:Warm] Capillary Refill: [Right:< 3 seconds] Dependent Rubor: [Right:No] Blanched when Elevated: [Right:No] Lipodermatosclerosis: [Right:No] Blood Pressure: Brachial: [Right:146] Ankle: [Right:Dorsalis Pedis: 118 0.81] Toe Nail Assessment Left:  Right: Thick: Yes DiscoloredKIMMBERLY, [name] ([id number]) [id number] Page 5 of 10 Deformed: Yes Improper Length and Hygiene: Yes Electronic Signature(s) Signed: 10/10/2023 8:01:26 AM By: Yevonne Pax RN Entered By: Yevonne Pax on 09/14/2023 11:12:12 -------------------------------------------------------------------------------- Multi Wound Chart Details Patient Name: Date of Service: [name]. 09/14/2023 1:30 PM Medical Record Number: [id number] Patient Account Number: [id number] Date of Birth/Sex: Treating RN: 19-Feb-1956 (67 y.o. [name] Primary Care Leiah Giannotti: Gillermo Murdoch Other Clinician: Referring Velena Keegan: Treating Pradeep Beaubrun/Extender: [name], [name] [name], [name]: 0 Vital Signs Height(in): 66 Pulse(bpm): 76 Weight(lbs): 188 Blood Pressure(mmHg):  Wound Tracing (instead of photographs) X- 1 5 Simple Wound Measurement - one wound []  - 0 Complex Wound Measurement - multiple wounds X- 1 5 Simple Wound Cleansing - one wound []  - 0 Complex Wound Cleansing - multiple wounds INTERVENTIONS - Wound Dressings X - Small Wound Dressing one or multiple wounds 1 10 []  - 0 Medium Wound Dressing one or multiple wounds []  - 0 Large Wound Dressing one or multiple wounds []  - 0 Application of Medications - injection INTERVENTIONS - Miscellaneous []  - 0 External ear exam []  - 0 Specimen Collection (cultures, biopsies, blood, body fluids, etc.) []  - 0 Specimen(s) / Culture(s) sent or taken to Lab for analysis []  - 0 Patient Transfer (multiple staff / Nurse, adult / Similar devices) []  - 0 Simple Staple / Suture removal (25 or less) []  - 0 Complex Staple / Suture removal (26 or more) []  - 0 Hypo / Hyperglycemic Management (close monitor of Blood Glucose) X- 1 15 Ankle / Brachial Index (ABI) - do not check if billed separately Has the patient been seen at the hospital within the last three years: Yes Total Score: 130 Level Of Care: New/Established - Level 4 Electronic Signature(s) Signed: 10/10/2023 8:01:26 AM By: Yevonne Pax RN Entered By: Yevonne Pax on 09/14/2023 11:38:04 -------------------------------------------------------------------------------- Encounter Discharge Information Details Patient Name: Date of Service: [name]. 09/14/2023 1:30 PM Medical Record Number: [id number] Patient Account Number: [id number] Date of Birth/Sex: Treating RN: 06-Sep-1956  (67 y.o. [name] Primary Care Kendyl Bissonnette: Gillermo Murdoch Other Clinician: Referring Makeila Yamaguchi: Treating Zekiah Caruth/Extender: [name], [name] [name], [name]: 0 Encounter Discharge Information Items Discharge Condition: Stable Ambulatory Status: Ambulatory [name], [name] ([id number]) [id number] Page 4 of 10 Discharge Destination: Home Transportation: Private Auto Accompanied By: self Schedule Follow-up Appointment: Yes Clinical Summary of Care: Electronic Signature(s) Signed: 10/10/2023 8:01:26 AM By: Yevonne Pax RN Entered By: Yevonne Pax on 09/14/2023 11:39:36 -------------------------------------------------------------------------------- Lower Extremity Assessment Details Patient Name: Date of Service: [name], [name] 09/14/2023 1:30 PM Medical Record Number: [id number] Patient Account Number: [id number] Date of Birth/Sex: Treating RN: 07-Oct-1956 (66 y.o. [name] Primary Care Orlyn Odonoghue: Gillermo Murdoch Other Clinician: Referring Filmore Molyneux: Treating Lennex Pietila/Extender: [name], [name] [name], [name]: 0 Edema Assessment Assessed: [Left: No] [Right: No] Edema: [Left: Ye] [Right: s] Calf Left: Right: Point of Measurement: 34 cm From Medial Instep 32 cm Ankle Left: Right: Point of Measurement: 9 cm From Medial Instep 22 cm Knee To Floor Left: Right: From Medial Instep 46 cm Vascular Assessment Pulses: Dorsalis Pedis Palpable: [Right:Yes] Doppler Audible: [Right:Yes] Extremity colors, hair growth, and conditions: Extremity Color: [Right:Hyperpigmented] Hair Growth on Extremity: [Right:No] Temperature of Extremity: [Right:Warm] Capillary Refill: [Right:< 3 seconds] Dependent Rubor: [Right:No] Blanched when Elevated: [Right:No] Lipodermatosclerosis: [Right:No] Blood Pressure: Brachial: [Right:146] Ankle: [Right:Dorsalis Pedis: 118 0.81] Toe Nail Assessment Left:  Right: Thick: Yes DiscoloredKIMMBERLY, [name] ([id number]) [id number] Page 5 of 10 Deformed: Yes Improper Length and Hygiene: Yes Electronic Signature(s) Signed: 10/10/2023 8:01:26 AM By: Yevonne Pax RN Entered By: Yevonne Pax on 09/14/2023 11:12:12 -------------------------------------------------------------------------------- Multi Wound Chart Details Patient Name: Date of Service: [name]. 09/14/2023 1:30 PM Medical Record Number: [id number] Patient Account Number: [id number] Date of Birth/Sex: Treating RN: 19-Feb-1956 (67 y.o. [name] Primary Care Leiah Giannotti: Gillermo Murdoch Other Clinician: Referring Velena Keegan: Treating Pradeep Beaubrun/Extender: [name], [name] [name], [name]: 0 Vital Signs Height(in): 66 Pulse(bpm): 76 Weight(lbs): 188 Blood Pressure(mmHg):  11/14/2023 Goal Status: Active Ulcer/skin breakdown will have a volume reduction of 80% by week 12 Date Initiated: 09/14/2023 Target Resolution Date: 12/15/2023 Goal Status: Active Ulcer/skin breakdown will heal within 14 weeks Date Initiated: 09/14/2023 Target Resolution Date: 01/15/2024 Goal Status: Active Interventions: Assess patient/caregiver ability to obtain necessary supplies Assess patient/caregiver ability to perform ulcer/skin care regimen upon admission and as needed Assess ulceration(s) every visit Notes: Electronic Signature(s) Signed: 10/10/2023 8:01:26 AM By: Yevonne Pax  RN Entered By: Yevonne Pax on 09/14/2023 11:14:04 -------------------------------------------------------------------------------- Pain Assessment Details Patient Name: Date of Service: [name], [name] 09/14/2023 1:30 PM Medical Record Number: [id number] Patient Account Number: [id number] Date of Birth/Sex: Treating RN: 01-17-1956 (67 y.o. Felechia, Fitz, [place] ([id number]) [id number] Page 8 of 10 Primary Care Arlynn Mcdermid: Gillermo Murdoch Other Clinician: Referring Perseus Westall: Treating Rigby Leonhardt/Extender: [name], [name] G Tejan-Sie, [name]: 0 Active Problems Location of Pain Severity and Description of Pain Patient Has Paino No Site Locations Pain Management and Medication Current Pain Management: Electronic Signature(s) Signed: 10/10/2023 8:01:26 AM By: Yevonne Pax RN Entered By: Yevonne Pax on 09/14/2023 10:55:09 -------------------------------------------------------------------------------- Patient/Caregiver Education Details Patient Name: Date of Service: [name] 10/2/2024andnbsp1:30 PM Medical Record Number: [id number] Patient Account Number: [id number] Date of Birth/Gender: Treating RN: 1955-12-17 (67 y.o. [name] Primary Care Physician: Gillermo Murdoch Other Clinician: Referring Physician: Treating Physician/Extender: [name], [name] [name], [name]: 0 Education Assessment Education Provided To: Patient Education Topics Provided Welcome [facility]-New Patient Packet: o Handouts: Welcome T [facility] o Methods: Explain/Verbal Responses: State content correctly Electronic Signature(s) Signed: 10/10/2023 8:01:26 AM By: Yevonne Pax RN Randon Goldsmith, Ova S ([id number]) [id number] Page 9 of 10 Entered By: Yevonne Pax on 09/14/2023 11:14:23 -------------------------------------------------------------------------------- Wound  Assessment Details Patient Name: Date of Service: [name], [name] 09/14/2023 1:30 PM Medical Record Number: [id number] Patient Account Number: [id number] Date of Birth/Sex: Treating RN: 1956-01-28 (67 y.o. [name] Primary Care Winner Valeriano: Gillermo Murdoch Other Clinician: Referring Bayler Gehrig: Treating Racer Quam/Extender: [name], [name] [name], [name]: 0 Wound Status Wound Number: 1 Primary Diabetic Wound/Ulcer of the Lower Extremity Etiology: Wound Location: Lower Leg Wound Open Wounding Event: Trauma Status: Date Acquired: 08/30/2023 Comorbid Chronic Obstructive Pulmonary Disease (COPD), Congestive Heart Weeks Of Treatment: 0 History: Failure, Hypertension, Peripheral Venous Disease, Type II Diabetes, Clustered Wound: No Seizure Disorder Photos Wound Measurements Length: (cm) 9 Width: (cm) 4 Depth: (cm) 0.3 Area: (cm) 28.274 Volume: (cm) 8.482 % Reduction in Area: % Reduction in Volume: Tunneling: No Undermining: No Wound Description Classification: Grade 1 Exudate Amount: Medium Exudate Type: Serosanguineous Exudate Color: red, brown Foul Odor After Cleansing: No Slough/Fibrino Yes Wound Bed Granulation Amount: Medium (34-66%) Exposed Structure Granulation Quality: Red Fascia Exposed: No Necrotic Amount: Medium (34-66%) Fat Layer (Subcutaneous Tissue) Exposed: Yes Necrotic Quality: Eschar, Adherent Slough Tendon Exposed: No Muscle Exposed: No Joint Exposed: No Bone Exposed: No Electronic Signature(s) Signed: 10/10/2023 8:01:26 AM By: Yevonne Pax RN Entered By: Yevonne Pax on 09/14/2023 11:11:00 [name] ([id number]) [id number] Page 10 of 10 -------------------------------------------------------------------------------- Vitals Details Patient Name: Date of Service: [name], [name] 09/14/2023 1:30 PM Medical Record Number: [id number] Patient Account Number: [id number] Date of Birth/Sex: Treating  RN: 04-Mar-1956 (67 y.o. [name] Primary Care Taeko Schaffer: Gillermo Murdoch Other Clinician: Referring Abrial Arrighi: Treating Kayceon Oki/Extender: [name], [name] G Tejan-Sie, [name]: 0 Vital Signs Time Taken: 13:55 Temperature (F):  [name], [name] ([id number]) [id number] Page 1 of 10 Visit Report for 09/14/2023 Allergy List Details Patient Name: Date of Service: [name], [name] 09/14/2023 1:30 PM Medical Record Number: [id number] Patient Account Number: [id number] Date of Birth/Sex: Treating RN: 02/25/1956 (67 y.o. [name] Primary Care Keiondra Brookover: Gillermo Murdoch Other Clinician: Referring Teshia Mahone: Treating Gracey Tolle/Extender: [name], [name] [name], [name]: 0 Allergies Active Allergies aspirin Tylenol NSAIDS (Non-Steroidal Anti-Inflammatory Drug) hydrocodone Thiazides Allergy Notes Electronic Signature(s) Signed: 10/10/2023 8:01:26 AM By: Yevonne Pax RN Entered By: Yevonne Pax on 09/14/2023 10:56:33 -------------------------------------------------------------------------------- Arrival Information Details Patient Name: Date of Service: [name]. 09/14/2023 1:30 PM Medical Record Number: [id number] Patient Account Number: [id number] Date of Birth/Sex: Treating RN: Sep 07, 1956 (67 y.o. [name] Primary Care Wrigley Winborne: Gillermo Murdoch Other Clinician: Referring Assad Harbeson: Treating Treyvin Glidden/Extender: [name], [name] [name], [name]: 0 Visit Information Patient Arrived: Cane Arrival Time: 13:54 Accompanied By: self Transfer Assistance: None Patient Identification Verified: Yes Secondary Verification Process CompletedMAELEE, [name] ([id number]) [id number] Page 2 of 10 Patient Requires Transmission-Based Precautions: No Patient Has Alerts: No Electronic Signature(s) Signed: 10/10/2023 8:01:26 AM By: Yevonne Pax RN Entered By: Yevonne Pax on 09/14/2023 10:54:52 -------------------------------------------------------------------------------- Clinic Level of Care Assessment Details Patient Name: Date of Service: [name], [name] 09/14/2023 1:30 PM Medical Record Number:  [id number] Patient Account Number: [id number] Date of Birth/Sex: Treating RN: 1956-09-16 (67 y.o. [name] Primary Care Analyah Mcconnon: Gillermo Murdoch Other Clinician: Referring Leticia Coletta: Treating Giorgi Debruin/Extender: [name], [name] [name], [name]: 0 Clinic Level of Care Assessment Items TOOL 2 Quantity Score X- 1 0 Use when only an EandM is performed on the INITIAL visit ASSESSMENTS - Nursing Assessment / Reassessment X- 1 20 General Physical Exam (combine w/ comprehensive assessment (listed just below) when performed on new pt. evals) X- 1 25 Comprehensive Assessment (HX, ROS, Risk Assessments, Wounds Hx, etc.) ASSESSMENTS - Wound and Skin A ssessment / Reassessment X - Simple Wound Assessment / Reassessment - one wound 1 5 []  - 0 Complex Wound Assessment / Reassessment - multiple wounds []  - 0 Dermatologic / Skin Assessment (not related to wound area) ASSESSMENTS - Ostomy and/or Continence Assessment and Care []  - 0 Incontinence Assessment and Management []  - 0 Ostomy Care Assessment and Management (repouching, etc.) PROCESS - Coordination of Care X - Simple Patient / Family Education for ongoing care 1 15 []  - 0 Complex (extensive) Patient / Family Education for ongoing care []  - 0 Staff obtains Chiropractor, Records, T Results / Process Orders est []  - 0 Staff telephones HHA, Nursing Homes / Clarify orders / etc []  - 0 Routine Transfer to another Facility (non-emergent condition) []  - 0 Routine Hospital Admission (non-emergent condition) []  - 0 New Admissions / Manufacturing engineer / Ordering NPWT Apligraf, etc. , X- 1 20 Emergency Hospital Admission (emergent condition) X- 1 10 Simple Discharge Coordination []  - 0 Complex (extensive) Discharge Coordination PROCESS - Special Needs []  - 0 Pediatric / Minor Patient Management []  - 0 Isolation Patient Management []  - 0 Hearing / Language / Visual special needs JERMANIE, NESMITH  ([id number]) [id number] Page 3 of 10 []  - 0 Assessment of Community assistance (transportation, D/C planning, etc.) []  - 0 Additional assistance / Altered mentation []  - 0 Support Surface(s) Assessment (bed, cushion, seat, etc.) INTERVENTIONS - Wound Cleansing / Measurement []  - 0 Wound Imaging (photographs - any number of wounds) []  - 0

## 2023-10-10 NOTE — Progress Notes (Signed)
to continue the Hydrofera Blue Ace dressings under Urgo K2 lite 2. I think she could probably tolerate a full-strength Urgo if need be however her edema seems well-controlled on the lighter variant 3. We talked to her about simply protecting her skin with support hose at this point. Also talked about smoking cessation Electronic Signature(s) Signed: 09/21/2023 4:50:17 PM By: Baltazar Najjar MD Entered By: Baltazar Najjar on 09/21/2023 07:57:20 -------------------------------------------------------------------------------- SuperBill Details Patient Name: Date of Service: [name] 09/21/2023 Medical Record Number: [id number] Patient Account Number: [id number] Date of Birth/Sex: Treating RN: 1956/06/26 (67 y.o. [name] Primary Care Provider: Gillermo Murdoch Other Clinician: Referring Provider: Treating Provider/Extender: [name], [name] [name], [name]: 1 Diagnosis Coding ICD-10 Codes Code Description [phone] Non-pressure chronic ulcer of other part of right lower leg with other specified severity S80.811D Abrasion, right lower leg, subsequent encounter I87.321 Chronic venous hypertension (idiopathic) with inflammation of right lower extremity Facility Procedures : CPT4 Code: [id number] Description: (Facility Use Only) [phone] - APPLY MULTLAY COMPRS LWR RT LEG Modifier: Quantity: 1 Physician Procedures : CPT4 Code Description Modifier  [id number] - WC PHYS LEVEL 3 - EST PT ICD-10 Diagnosis Description L97.818 Non-pressure chronic ulcer of other part of right lower leg with other specified severity I87.321 Chronic venous hypertension (idiopathic) with  inflammation of right lower extremity Quantity: 1 Electronic Signature(s) Signed: 09/21/2023 4:10:26 PM By: Yevonne Pax RN Signed: 09/21/2023 4:50:17 PM By: Baltazar Najjar MD Entered By: Yevonne Pax on 09/21/2023 13:10:26  ALIVIAH, BOWRING ([id number]) [id number] Page 1 of 5 Visit Report for 09/21/2023 HPI Details Patient Name: Date of Service: [name], [name] 09/21/2023 10:15 A M Medical Record Number: [id number] Patient Account Number: [id number] Date of Birth/Sex: Treating RN: 02-17-1956 (67 y.o. [name] Primary Care Provider: Gillermo Murdoch Other Clinician: Referring Provider: Treating Provider/Extender: [name], [name] [name], [name]: 1 History of Present Illness HPI Description: ADMISSION 09/14/2023 This is a 67 year old woman whose problem began around 9 September she was stepping up onto a curb fell and suffered a skin tear substantially on the right lower leg. She was seen in by primary care given mupirocin and a course of cefadroxil. The cefadroxil was for 14 days. She is still applying the the mupirocin. She does not have a prior wound history in this leg although she probably has some degree of chronic venous insufficiency Past medical history includes type 2 diabetes in the chart although the patient claims she is prediabetic, chronic kidney disease stage IIIb, varicose veins followed previously by vascular vascular surgery, hypertension, headaches, CHF, COPD, gastroesophageal reflux disease, peripheral vascular disease and degenerative disc disease ABI in the right was 0.81 in our clinic. Patient is a 1-1/2 pack/day smoker 10/9; patient I admitted to the clinic last week suffering a substantial skin tear on the right lower leg. Some of this probably was a hematoma at the time. She has had wounds on her legs before usually traumatic that have healed last week we applied Hydrofera Blue with Urgo K2 light compression. She is tolerated this well and the wound is improved Electronic Signature(s) Signed: 09/21/2023 4:50:17 PM By: Baltazar Najjar MD Entered By: Baltazar Najjar on 09/21/2023  07:55:24 -------------------------------------------------------------------------------- Physical Exam Details Patient Name: Date of Service: [name] 09/21/2023 10:15 A M Medical Record Number: [id number] Patient Account Number: [id number] Date of Birth/Sex: Treating RN: [birth year] (67 y.o. [name] Primary Care Provider: Gillermo Murdoch Other Clinician: Referring Provider: Treating Provider/Extender: [name], [name] [name], [name]: 1 Constitutional Patient is hypertensive.. Pulse regular and within target range for patient.Marland Kitchen Respirations regular, non-labored and within target range.. Temperature is normal and within the target range for the patient.Marland Kitchen appears in no distress. Notes CRESSA, DISSINGER ([id number]) [id number] Page 2 of 5 Wound exam; skin tear on the right anterior lower leg and mid tibia. This is along wound but narrow. Surface looks excellent this week healthy granulation. At the distal part of the wound under illumination there is some fibrinous slough although I elected not to debride this. There is nice rims of epithelialization even islands of epithelialization are seen pedal pulses are palpable on the right Electronic Signature(s) Signed: 09/21/2023 4:50:17 PM By: Baltazar Najjar MD Entered By: Baltazar Najjar on 09/21/2023 07:56:23 -------------------------------------------------------------------------------- Physician Orders Details Patient Name: Date of Service: [name] 09/21/2023 10:15 A M Medical Record Number: [id number] Patient Account Number: [id number] Date of Birth/Sex: Treating RN: 10/07/1956 (67 y.o. [name] Primary Care Provider: Gillermo Murdoch Other Clinician: Referring Provider: Treating Provider/Extender: [name], [name] [name], [name]: 1 Verbal / Phone Orders: No Diagnosis Coding Follow-up Appointments Return Appointment in 1  week. Bathing/ Shower/ Hygiene May shower with wound dressing protected with water repellent cover or cast protector. Anesthetic (Use 'Patient Medications' Section for Anesthetic Order Entry) Lidocaine applied to wound bed Edema Control - Lymphedema / Segmental Compressive Device / Other Elevate, Exercise Daily and A void Standing  for Long Periods of Time. Elevate legs to the level of the heart and pump ankles as often as possible Elevate leg(s) parallel to the floor when sitting. Wound Treatment Wound #1 - Lower Leg Wound Laterality: Right Cleanser: Soap and Water 1 x Per Week/30 Days Discharge Instructions: Gently cleanse wound with antibacterial soap, rinse and pat dry prior to dressing wounds Cleanser: Wound Cleanser 1 x Per Week/30 Days Discharge Instructions: Wash your hands with soap and water. Remove old dressing, discard into plastic bag and place into trash. Cleanse the wound with Wound Cleanser prior to applying a clean dressing using gauze sponges, not tissues or cotton balls. Do not scrub or use excessive force. Pat dry using gauze sponges, not tissue or cotton balls. Prim Dressing: Hydrofera Blue Ready Transfer Foam, 4x5 (in/in) 1 x Per Week/30 Days ary Discharge Instructions: Apply Hydrofera Blue Ready to wound bed as directed Secondary Dressing: ABD Pad 5x9 (in/in) 1 x Per Week/30 Days Discharge Instructions: Cover with ABD pad Compression Wrap: Urgo K2 Lite, two layer compression system, regular 1 x Per Week/30 Days Electronic Signature(s) Signed: 09/21/2023 4:50:17 PM By: Baltazar Najjar MD Signed: 10/10/2023 8:01:26 AM By: Yevonne Pax RN Entered By: Yevonne Pax on 09/21/2023 07:32:30 [name] ([id number]) [id number] Page 3 of 5 -------------------------------------------------------------------------------- Problem List Details Patient Name: Date of Service: DELITA, DABDOUB 09/21/2023 10:15 A M Medical Record Number:  [id number] Patient Account Number: [id number] Date of Birth/Sex: Treating RN: [birth year] (67 y.o. [name] Primary Care Provider: Gillermo Murdoch Other Clinician: Referring Provider: Treating Provider/Extender: [name], [name] [name], [name]: 1 Active Problems ICD-10 Encounter Code Description Active Date MDM Diagnosis L97.818 Non-pressure chronic ulcer of other part of right lower leg with other specified 09/14/2023 No Yes severity S80.811D Abrasion, right lower leg, subsequent encounter 09/14/2023 No Yes I87.321 Chronic venous hypertension (idiopathic) with inflammation of right lower 09/14/2023 No Yes extremity Inactive Problems Resolved Problems Electronic Signature(s) Signed: 09/21/2023 4:50:17 PM By: Baltazar Najjar MD Entered By: Baltazar Najjar on 09/21/2023 07:54:04 -------------------------------------------------------------------------------- Progress Note Details Patient Name: Date of Service: [name] 09/21/2023 10:15 A M Medical Record Number: [id number] Patient Account Number: [id number] Date of Birth/Sex: Treating RN: [birth year] (67 y.o. [name] Primary Care Provider: Gillermo Murdoch Other Clinician: Referring Provider: Treating Provider/Extender: [name], [name] [name], [name]: 1 Subjective Countess, Desmond S ([id number]) [id number] Page 4 of 5 History of Present Illness (HPI) ADMISSION 09/14/2023 This is a 67 year old woman whose problem began around 9 September she was stepping up onto a curb fell and suffered a skin tear substantially on the right lower leg. She was seen in by primary care given mupirocin and a course of cefadroxil. The cefadroxil was for 14 days. She is still applying the the mupirocin. She does not have a prior wound history in this leg although she probably has some degree of chronic venous insufficiency Past medical history includes type 2  diabetes in the chart although the patient claims she is prediabetic, chronic kidney disease stage IIIb, varicose veins followed previously by vascular vascular surgery, hypertension, headaches, CHF, COPD, gastroesophageal reflux disease, peripheral vascular disease and degenerative disc disease ABI in the right was 0.81 in our clinic. Patient is a 1-1/2 pack/day smoker 10/9; patient I admitted to the clinic last week suffering a substantial skin tear on the right lower leg. Some of this probably was a hematoma at the time. She has had wounds on her  for Long Periods of Time. Elevate legs to the level of the heart and pump ankles as often as possible Elevate leg(s) parallel to the floor when sitting. Wound Treatment Wound #1 - Lower Leg Wound Laterality: Right Cleanser: Soap and Water 1 x Per Week/30 Days Discharge Instructions: Gently cleanse wound with antibacterial soap, rinse and pat dry prior to dressing wounds Cleanser: Wound Cleanser 1 x Per Week/30 Days Discharge Instructions: Wash your hands with soap and water. Remove old dressing, discard into plastic bag and place into trash. Cleanse the wound with Wound Cleanser prior to applying a clean dressing using gauze sponges, not tissues or cotton balls. Do not scrub or use excessive force. Pat dry using gauze sponges, not tissue or cotton balls. Prim Dressing: Hydrofera Blue Ready Transfer Foam, 4x5 (in/in) 1 x Per Week/30 Days ary Discharge Instructions: Apply Hydrofera Blue Ready to wound bed as directed Secondary Dressing: ABD Pad 5x9 (in/in) 1 x Per Week/30 Days Discharge Instructions: Cover with ABD pad Compression Wrap: Urgo K2 Lite, two layer compression system, regular 1 x Per Week/30 Days Electronic Signature(s) Signed: 09/21/2023 4:50:17 PM By: Baltazar Najjar MD Signed: 10/10/2023 8:01:26 AM By: Yevonne Pax RN Entered By: Yevonne Pax on 09/21/2023 07:32:30 [name] ([id number]) [id number] Page 3 of 5 -------------------------------------------------------------------------------- Problem List Details Patient Name: Date of Service: DELITA, DABDOUB 09/21/2023 10:15 A M Medical Record Number:  [id number] Patient Account Number: [id number] Date of Birth/Sex: Treating RN: [birth year] (67 y.o. [name] Primary Care Provider: Gillermo Murdoch Other Clinician: Referring Provider: Treating Provider/Extender: [name], [name] [name], [name]: 1 Active Problems ICD-10 Encounter Code Description Active Date MDM Diagnosis L97.818 Non-pressure chronic ulcer of other part of right lower leg with other specified 09/14/2023 No Yes severity S80.811D Abrasion, right lower leg, subsequent encounter 09/14/2023 No Yes I87.321 Chronic venous hypertension (idiopathic) with inflammation of right lower 09/14/2023 No Yes extremity Inactive Problems Resolved Problems Electronic Signature(s) Signed: 09/21/2023 4:50:17 PM By: Baltazar Najjar MD Entered By: Baltazar Najjar on 09/21/2023 07:54:04 -------------------------------------------------------------------------------- Progress Note Details Patient Name: Date of Service: [name] 09/21/2023 10:15 A M Medical Record Number: [id number] Patient Account Number: [id number] Date of Birth/Sex: Treating RN: [birth year] (67 y.o. [name] Primary Care Provider: Gillermo Murdoch Other Clinician: Referring Provider: Treating Provider/Extender: [name], [name] [name], [name]: 1 Subjective Countess, Desmond S ([id number]) [id number] Page 4 of 5 History of Present Illness (HPI) ADMISSION 09/14/2023 This is a 67 year old woman whose problem began around 9 September she was stepping up onto a curb fell and suffered a skin tear substantially on the right lower leg. She was seen in by primary care given mupirocin and a course of cefadroxil. The cefadroxil was for 14 days. She is still applying the the mupirocin. She does not have a prior wound history in this leg although she probably has some degree of chronic venous insufficiency Past medical history includes type 2  diabetes in the chart although the patient claims she is prediabetic, chronic kidney disease stage IIIb, varicose veins followed previously by vascular vascular surgery, hypertension, headaches, CHF, COPD, gastroesophageal reflux disease, peripheral vascular disease and degenerative disc disease ABI in the right was 0.81 in our clinic. Patient is a 1-1/2 pack/day smoker 10/9; patient I admitted to the clinic last week suffering a substantial skin tear on the right lower leg. Some of this probably was a hematoma at the time. She has had wounds on her

## 2023-10-10 NOTE — Progress Notes (Signed)
Reduction in A rea: 93.60% Harrington/A Harrington/A % Reduction in Volume: Grade 1 Harrington/A Harrington/A Classification: Medium Harrington/A Harrington/A Exudate A mount: Serosanguineous Harrington/A Harrington/A Exudate Type: red, brown Harrington/A Harrington/A Exudate Color: Medium (34-66%) Harrington/A Harrington/A Granulation A mount: Red Harrington/A Harrington/A Granulation Quality: Medium (34-66%) Harrington/A Harrington/A Necrotic A mount: Eschar, Adherent Slough Harrington/A Harrington/A Necrotic Tissue: Fat Layer (Subcutaneous Tissue): Yes Harrington/A Harrington/A Exposed Structures: Fascia: No Tendon: No Muscle: No Joint: No Bone: No Medium (34-66%) Harrington/A Harrington/A Epithelialization: Treatment Notes Electronic Signature(Harrington) Signed: 10/10/2023 8:00:25 AM By: Rachel Pax RN Entered By: [name] on 10/05/2023 09:50:45 -------------------------------------------------------------------------------- Multi-Disciplinary Care Plan Details Patient Name: Date of Service: [name] 10/05/2023 12:45 PM Medical Record Number: [id number] Patient Account Number: [id number] Date of Birth/Sex: Treating RN: Sep 15, 1956 (67 y.o. [name] Primary Care  Laval Cafaro: [name] Other Clinician: Referring [name]: Treating [name]/Extender: [name], [name] in Treatment: 3 Active Inactive Abuse / Safety / Falls / Self Care Management Nursing Diagnoses: Potential for falls [name], [name] ([id number]) [id number] Page 6 of 10 Goals: Patient will not experience any injury related to fire Date Initiated: 09/14/2023 Target Resolution Date: 10/15/2023 Goal Status: Active Interventions: Assess fall risk on admission and as needed Assess: immobility, friction, shearing, incontinence upon admission and as needed Assess impairment of mobility on admission and as needed per policy Assess self care needs on admission and as needed Notes: Necrotic Tissue Nursing Diagnoses: Knowledge deficit related to management of necrotic/devitalized tissue Goals: Necrotic/devitalized tissue will be minimized in Rachel wound bed Date Initiated: 09/14/2023 Target Resolution Date: 10/15/2023 Goal Status: Active Interventions: Assess patient pain level pre-, during and post procedure and prior to discharge Treatment Activities: Apply topical anesthetic as ordered : 09/14/2023 Notes: Wound/Skin Impairment Nursing Diagnoses: Knowledge deficit related to ulceration/compromised skin integrity Goals: Patient/caregiver will verbalize understanding of skin care regimen Date Initiated: 09/14/2023 Target Resolution Date: 10/15/2023 Goal Status: Active Ulcer/skin breakdown will have a volume reduction of 30% by week 4 Date Initiated: 09/14/2023 Target Resolution Date: 10/15/2023 Goal Status: Active Ulcer/skin breakdown will have a volume reduction of 50% by week 8 Date Initiated: 09/14/2023 Target Resolution Date: 11/14/2023 Goal Status: Active Ulcer/skin breakdown will have a volume reduction of 80% by week 12 Date Initiated: 09/14/2023 Target Resolution Date: 12/15/2023 Goal Status: Active Ulcer/skin breakdown  will heal within 14 Harrington Date Initiated: 09/14/2023 Target Resolution Date: 01/15/2024 Goal Status: Active Interventions: Assess patient/caregiver ability to obtain necessary supplies Assess patient/caregiver ability to perform ulcer/skin care regimen upon admission and as needed Assess ulceration(Harrington) every visit Notes: Electronic Signature(Harrington) Signed: 10/10/2023 8:00:25 AM By: Rachel Pax RN Entered By: [name] on 10/05/2023 09:51:11 [name] ([id number]) [id number] Page 7 of 10 -------------------------------------------------------------------------------- Pain Assessment Details Patient Name: Date of Service: [name] 10/05/2023 12:45 PM Medical Record Number: [id number] Patient Account Number: [id number] Date of Birth/Sex: Treating RN: 11/29/1956 (67 y.o. [name] Primary Care [name]: [name] Other Clinician: Referring Andreea Arca: Treating Fritzi Scripter/Extender: [name], [name] in Treatment: 3 Active Problems Location of Pain Severity and Description of Pain Patient Has Paino No Site Locations Pain Management and Medication Current Pain Management: Electronic Signature(Harrington) Signed: 10/10/2023 8:00:25 AM By: Rachel Pax RN Entered By: [name] on 10/05/2023 09:43:08 -------------------------------------------------------------------------------- Patient/Caregiver Education Details Patient Name: Date of Service: [name] 10/23/2024andnbsp12:45 PM Medical Record Number: [id number] Patient Account Number: [id number] Date of Birth/Gender: Treating RN: 23-Jul-1956 (67 y.o. [name] Primary Care  [name] within Rachel last three years: Yes Total Score: 0 Level Of Care: ____ Electronic Signature(Harrington) Signed: 10/10/2023 8:00:25 AM By: Rachel Pax RN Entered By: [name] on 10/05/2023 09:57:31 -------------------------------------------------------------------------------- Compression Therapy Details Patient Name: Date of Service: [name] 10/05/2023 12:45 PM Medical Record Number: [id number] Patient Account Number: [id number] Date of Birth/Sex: Treating RN: 29-Dec-1955 (67 y.o. [name] Primary Care [name]: [name] Other Clinician: Referring [name]: Treating [name]/Extender: [name], [name] in Treatment: 3 [name] ([id number]) [id number] Page 3 of 10 Compression Therapy Performed for Wound Assessment: Wound #1 Right Lower Leg Performed By: Clinician Rachel Pax, RN Compression Type: Double Layer Post Procedure Diagnosis Same as Pre-procedure Electronic Signature(Harrington) Signed: 10/10/2023 8:00:25 AM By: Rachel Pax RN Entered By: [name] on 10/05/2023 09:57:21 -------------------------------------------------------------------------------- Encounter Discharge Information Details Patient Name: Date of Service: [name] 10/05/2023 12:45 PM Medical Record Number: [id number] Patient Account Number: [id number] Date of Birth/Sex: Treating RN: 1956-10-29 (67 y.o. [name] Primary Care Berda Shelvin: [name] Other Clinician: Referring Jovaughn Wojtaszek: Treating Billie Intriago/Extender: [name], [name] in Treatment: 3 Encounter Discharge Information Items Discharge Condition: Stable Ambulatory Status: Ambulatory Discharge Destination: Home Transportation: Private Auto Accompanied By: self Schedule Follow-up  Appointment: Yes Clinical Summary of Care: Electronic Signature(Harrington) Signed: 10/10/2023 8:00:25 AM By: Rachel Pax RN Entered By: [name] on 10/05/2023 09:58:11 -------------------------------------------------------------------------------- Lower Extremity Assessment Details Patient Name: Date of Service: LASHONNA, CAPURRO 10/05/2023 12:45 PM Medical Record Number: [id number] Patient Account Number: [id number] Date of Birth/Sex: Treating RN: 09-Sep-1956 (67 y.o. [name] Primary Care Jarrell Armond: [name] Other Clinician: Referring Adalida Garver: Treating Dedee Liss/Extender: [name], [name] in Treatment: 3 Edema Assessment Assessed: [Left: No] [Right: No] L[LeftHILDY, FARIES ([id number])] [KGMWN: [id number] Page 4 of 10] Edema: [Left: Ye] [Right: Harrington] Calf Left: Right: Point of Measurement: 34 cm From Medial Instep 30 cm Ankle Left: Right: Point of Measurement: 10 cm From Medial Instep 22 cm Knee To Floor Left: Right: From Medial Instep 46 cm Vascular Assessment Pulses: Dorsalis Pedis Palpable: [Right:No] Extremity colors, hair growth, and conditions: Extremity Color: [Right:Hyperpigmented] Temperature of Extremity: [Right:Warm] Capillary Refill: [Right:< 3 seconds] Dependent Rubor: [Right:No] Blanched when Elevated: [Right:No No] Toe Nail Assessment Left: Right: Thick: Yes Discolored: Yes Deformed: Yes Improper Length and Hygiene: Yes Electronic Signature(Harrington) Signed: 10/10/2023 8:00:25 AM By: Rachel Pax RN Entered By: [name] on 10/05/2023 09:50:41 -------------------------------------------------------------------------------- Multi Wound Chart Details Patient Name: Date of Service: [name] 10/05/2023 12:45 PM Medical Record Number: [id number] Patient Account Number: [id number] Date of Birth/Sex: Treating RN: 15-Aug-1956 (67 y.o. [name] Primary Care Dharma Pare: [name]  Other Clinician: Referring Xitlally Mooneyham: Treating Dylan Monforte/Extender: [name], [name] in Treatment: 3 Vital Signs Height(in): 66 Pulse(bpm): 66 Weight(lbs): 188 Blood Pressure(mmHg): 162/85 Body Mass Index(BMI): 30.3 Temperature(F): 98.2 Respiratory Rate(breaths/min): 18 [1:Photos:] [Harrington/A:Harrington/A] Right Lower Leg Harrington/A Harrington/A Wound Location: Trauma Harrington/A Harrington/A Wounding Event: Diabetic Wound/Ulcer of Rachel Lower Harrington/A Harrington/A Primary Etiology: Extremity Chronic Obstructive Pulmonary Harrington/A Harrington/A Comorbid History: Disease (COPD), Congestive Heart Failure, Hypertension, Peripheral Venous Disease, Type II Diabetes, Seizure Disorder 08/30/2023 Harrington/A Harrington/A Date Acquired: 3 Harrington/A Harrington/A Harrington of Treatment: Open Harrington/A Harrington/A Wound Status: No Harrington/A Harrington/A Wound Recurrence: 2.3x1.5x0.2 Harrington/A Harrington/A Measurements L x W x D (cm) 2.71 Harrington/A Harrington/A A (cm) : rea 0.542 Harrington/A Harrington/A Volume (cm) : 90.40% Harrington/A Harrington/A %  Reduction in A rea: 93.60% Harrington/A Harrington/A % Reduction in Volume: Grade 1 Harrington/A Harrington/A Classification: Medium Harrington/A Harrington/A Exudate A mount: Serosanguineous Harrington/A Harrington/A Exudate Type: red, brown Harrington/A Harrington/A Exudate Color: Medium (34-66%) Harrington/A Harrington/A Granulation A mount: Red Harrington/A Harrington/A Granulation Quality: Medium (34-66%) Harrington/A Harrington/A Necrotic A mount: Eschar, Adherent Slough Harrington/A Harrington/A Necrotic Tissue: Fat Layer (Subcutaneous Tissue): Yes Harrington/A Harrington/A Exposed Structures: Fascia: No Tendon: No Muscle: No Joint: No Bone: No Medium (34-66%) Harrington/A Harrington/A Epithelialization: Treatment Notes Electronic Signature(Harrington) Signed: 10/10/2023 8:00:25 AM By: Rachel Pax RN Entered By: [name] on 10/05/2023 09:50:45 -------------------------------------------------------------------------------- Multi-Disciplinary Care Plan Details Patient Name: Date of Service: [name] 10/05/2023 12:45 PM Medical Record Number: [id number] Patient Account Number: [id number] Date of Birth/Sex: Treating RN: Sep 15, 1956 (67 y.o. [name] Primary Care  Laval Cafaro: [name] Other Clinician: Referring [name]: Treating [name]/Extender: [name], [name] in Treatment: 3 Active Inactive Abuse / Safety / Falls / Self Care Management Nursing Diagnoses: Potential for falls [name], [name] ([id number]) [id number] Page 6 of 10 Goals: Patient will not experience any injury related to fire Date Initiated: 09/14/2023 Target Resolution Date: 10/15/2023 Goal Status: Active Interventions: Assess fall risk on admission and as needed Assess: immobility, friction, shearing, incontinence upon admission and as needed Assess impairment of mobility on admission and as needed per policy Assess self care needs on admission and as needed Notes: Necrotic Tissue Nursing Diagnoses: Knowledge deficit related to management of necrotic/devitalized tissue Goals: Necrotic/devitalized tissue will be minimized in Rachel wound bed Date Initiated: 09/14/2023 Target Resolution Date: 10/15/2023 Goal Status: Active Interventions: Assess patient pain level pre-, during and post procedure and prior to discharge Treatment Activities: Apply topical anesthetic as ordered : 09/14/2023 Notes: Wound/Skin Impairment Nursing Diagnoses: Knowledge deficit related to ulceration/compromised skin integrity Goals: Patient/caregiver will verbalize understanding of skin care regimen Date Initiated: 09/14/2023 Target Resolution Date: 10/15/2023 Goal Status: Active Ulcer/skin breakdown will have a volume reduction of 30% by week 4 Date Initiated: 09/14/2023 Target Resolution Date: 10/15/2023 Goal Status: Active Ulcer/skin breakdown will have a volume reduction of 50% by week 8 Date Initiated: 09/14/2023 Target Resolution Date: 11/14/2023 Goal Status: Active Ulcer/skin breakdown will have a volume reduction of 80% by week 12 Date Initiated: 09/14/2023 Target Resolution Date: 12/15/2023 Goal Status: Active Ulcer/skin breakdown  will heal within 14 Harrington Date Initiated: 09/14/2023 Target Resolution Date: 01/15/2024 Goal Status: Active Interventions: Assess patient/caregiver ability to obtain necessary supplies Assess patient/caregiver ability to perform ulcer/skin care regimen upon admission and as needed Assess ulceration(Harrington) every visit Notes: Electronic Signature(Harrington) Signed: 10/10/2023 8:00:25 AM By: Rachel Pax RN Entered By: [name] on 10/05/2023 09:51:11 [name] ([id number]) [id number] Page 7 of 10 -------------------------------------------------------------------------------- Pain Assessment Details Patient Name: Date of Service: [name] 10/05/2023 12:45 PM Medical Record Number: [id number] Patient Account Number: [id number] Date of Birth/Sex: Treating RN: 11/29/1956 (67 y.o. [name] Primary Care [name]: [name] Other Clinician: Referring Andreea Arca: Treating Fritzi Scripter/Extender: [name], [name] in Treatment: 3 Active Problems Location of Pain Severity and Description of Pain Patient Has Paino No Site Locations Pain Management and Medication Current Pain Management: Electronic Signature(Harrington) Signed: 10/10/2023 8:00:25 AM By: Rachel Pax RN Entered By: [name] on 10/05/2023 09:43:08 -------------------------------------------------------------------------------- Patient/Caregiver Education Details Patient Name: Date of Service: [name] 10/23/2024andnbsp12:45 PM Medical Record Number: [id number] Patient Account Number: [id number] Date of Birth/Gender: Treating RN: 23-Jul-1956 (67 y.o. [name] Primary Care  [name] within Rachel last three years: Yes Total Score: 0 Level Of Care: ____ Electronic Signature(Harrington) Signed: 10/10/2023 8:00:25 AM By: Rachel Pax RN Entered By: [name] on 10/05/2023 09:57:31 -------------------------------------------------------------------------------- Compression Therapy Details Patient Name: Date of Service: [name] 10/05/2023 12:45 PM Medical Record Number: [id number] Patient Account Number: [id number] Date of Birth/Sex: Treating RN: 29-Dec-1955 (67 y.o. [name] Primary Care [name]: [name] Other Clinician: Referring [name]: Treating [name]/Extender: [name], [name] in Treatment: 3 [name] ([id number]) [id number] Page 3 of 10 Compression Therapy Performed for Wound Assessment: Wound #1 Right Lower Leg Performed By: Clinician Rachel Pax, RN Compression Type: Double Layer Post Procedure Diagnosis Same as Pre-procedure Electronic Signature(Harrington) Signed: 10/10/2023 8:00:25 AM By: Rachel Pax RN Entered By: [name] on 10/05/2023 09:57:21 -------------------------------------------------------------------------------- Encounter Discharge Information Details Patient Name: Date of Service: [name] 10/05/2023 12:45 PM Medical Record Number: [id number] Patient Account Number: [id number] Date of Birth/Sex: Treating RN: 1956-10-29 (67 y.o. [name] Primary Care Berda Shelvin: [name] Other Clinician: Referring Jovaughn Wojtaszek: Treating Billie Intriago/Extender: [name], [name] in Treatment: 3 Encounter Discharge Information Items Discharge Condition: Stable Ambulatory Status: Ambulatory Discharge Destination: Home Transportation: Private Auto Accompanied By: self Schedule Follow-up  Appointment: Yes Clinical Summary of Care: Electronic Signature(Harrington) Signed: 10/10/2023 8:00:25 AM By: Rachel Pax RN Entered By: [name] on 10/05/2023 09:58:11 -------------------------------------------------------------------------------- Lower Extremity Assessment Details Patient Name: Date of Service: LASHONNA, CAPURRO 10/05/2023 12:45 PM Medical Record Number: [id number] Patient Account Number: [id number] Date of Birth/Sex: Treating RN: 09-Sep-1956 (67 y.o. [name] Primary Care Jarrell Armond: [name] Other Clinician: Referring Adalida Garver: Treating Dedee Liss/Extender: [name], [name] in Treatment: 3 Edema Assessment Assessed: [Left: No] [Right: No] L[LeftHILDY, FARIES ([id number])] [KGMWN: [id number] Page 4 of 10] Edema: [Left: Ye] [Right: Harrington] Calf Left: Right: Point of Measurement: 34 cm From Medial Instep 30 cm Ankle Left: Right: Point of Measurement: 10 cm From Medial Instep 22 cm Knee To Floor Left: Right: From Medial Instep 46 cm Vascular Assessment Pulses: Dorsalis Pedis Palpable: [Right:No] Extremity colors, hair growth, and conditions: Extremity Color: [Right:Hyperpigmented] Temperature of Extremity: [Right:Warm] Capillary Refill: [Right:< 3 seconds] Dependent Rubor: [Right:No] Blanched when Elevated: [Right:No No] Toe Nail Assessment Left: Right: Thick: Yes Discolored: Yes Deformed: Yes Improper Length and Hygiene: Yes Electronic Signature(Harrington) Signed: 10/10/2023 8:00:25 AM By: Rachel Pax RN Entered By: [name] on 10/05/2023 09:50:41 -------------------------------------------------------------------------------- Multi Wound Chart Details Patient Name: Date of Service: [name] 10/05/2023 12:45 PM Medical Record Number: [id number] Patient Account Number: [id number] Date of Birth/Sex: Treating RN: 15-Aug-1956 (67 y.o. [name] Primary Care Dharma Pare: [name]  Other Clinician: Referring Xitlally Mooneyham: Treating Dylan Monforte/Extender: [name], [name] in Treatment: 3 Vital Signs Height(in): 66 Pulse(bpm): 66 Weight(lbs): 188 Blood Pressure(mmHg): 162/85 Body Mass Index(BMI): 30.3 Temperature(F): 98.2 Respiratory Rate(breaths/min): 18 [1:Photos:] [Harrington/A:Harrington/A] Right Lower Leg Harrington/A Harrington/A Wound Location: Trauma Harrington/A Harrington/A Wounding Event: Diabetic Wound/Ulcer of Rachel Lower Harrington/A Harrington/A Primary Etiology: Extremity Chronic Obstructive Pulmonary Harrington/A Harrington/A Comorbid History: Disease (COPD), Congestive Heart Failure, Hypertension, Peripheral Venous Disease, Type II Diabetes, Seizure Disorder 08/30/2023 Harrington/A Harrington/A Date Acquired: 3 Harrington/A Harrington/A Harrington of Treatment: Open Harrington/A Harrington/A Wound Status: No Harrington/A Harrington/A Wound Recurrence: 2.3x1.5x0.2 Harrington/A Harrington/A Measurements L x W x D (cm) 2.71 Harrington/A Harrington/A A (cm) : rea 0.542 Harrington/A Harrington/A Volume (cm) : 90.40% Harrington/A Harrington/A %

## 2023-10-10 NOTE — Progress Notes (Signed)
[name], [name] ([id number]) [id number] Page 1 of 9 Visit Report for 09/21/2023 Arrival Information Details Patient Name: Date of Service: [name] 09/21/2023 10:15 A M Medical Record Number: [id number] Patient Account Number: [id number] Date of Birth/Sex: Treating RN: [birth year] (67 y.o. [name] Primary Care Asante Blanda: Gillermo Murdoch Other Clinician: Referring Analysa Nutting: Treating Ameir Faria/Extender: [name], [name] [name], [name]: 1 Visit Information History Since Last Visit Added or deleted any medications: No Patient Arrived: Ambulatory Any new allergies or adverse reactions: No Arrival Time: 10:13 Had a fall or experienced change in No Accompanied By: self activities of daily living that may affect Transfer Assistance: None risk of falls: Patient Identification Verified: Yes Signs or symptoms of abuse/neglect since last visito No Secondary Verification Process Completed: Yes Hospitalized since last visit: No Patient Requires Transmission-Based Precautions: No Implantable device outside of the clinic excluding No Patient Has Alerts: No cellular tissue based products placed in the center since last visit: Has Dressing in Place as Prescribed: Yes Pain Present Now: No Electronic Signature(s) Signed: 10/10/2023 8:01:26 AM By: Yevonne Pax RN Entered By: Yevonne Pax on 09/21/2023 07:13:50 -------------------------------------------------------------------------------- Clinic Level of Care Assessment Details Patient Name: Date of Service: [name] 09/21/2023 10:15 A M Medical Record Number: [id number] Patient Account Number: [id number] Date of Birth/Sex: Treating RN: [birth year]Harrington (67 y.o. [name] Primary Care Duane Trias: Gillermo Murdoch Other Clinician: Referring Vee Bahe: Treating Velisa Regnier/Extender: [name], [name] [name], [name]: 1 Clinic Level of Care Assessment  Items TOOL 1 Quantity Score []  - 0 Use when EandM and Procedure is performed on INITIAL visit ASSESSMENTS - Nursing Assessment / Reassessment []  - 0 General Physical Exam (combine w/ comprehensive assessment (listed just below) when performed on new pt. evals) []  - 0 Comprehensive Assessment (HX, ROS, Risk Assessments, Wounds Hx, etc.) Smylie, Alexya S ([id number]) [id number] Page 2 of 9 ASSESSMENTS - Wound and Skin Assessment / Reassessment []  - 0 Dermatologic / Skin Assessment (not related to wound area) ASSESSMENTS - Ostomy and/or Continence Assessment and Care []  - 0 Incontinence Assessment and Management []  - 0 Ostomy Care Assessment and Management (repouching, etc.) PROCESS - Coordination of Care []  - 0 Simple Patient / Family Education for ongoing care []  - 0 Complex (extensive) Patient / Family Education for ongoing care []  - 0 Staff obtains Chiropractor, Records, T Results / Process Orders est []  - 0 Staff telephones HHA, Nursing Homes / Clarify orders / etc []  - 0 Routine Transfer to another Facility (non-emergent condition) []  - 0 Routine Hospital Admission (non-emergent condition) []  - 0 New Admissions / Manufacturing engineer / Ordering NPWT Apligraf, etc. , []  - 0 Emergency Hospital Admission (emergent condition) PROCESS - Special Needs []  - 0 Pediatric / Minor Patient Management []  - 0 Isolation Patient Management []  - 0 Hearing / Language / Visual special needs []  - 0 Assessment of Community assistance (transportation, D/C planning, etc.) []  - 0 Additional assistance / Altered mentation []  - 0 Support Surface(s) Assessment (bed, cushion, seat, etc.) INTERVENTIONS - Miscellaneous []  - 0 External ear exam []  - 0 Patient Transfer (multiple staff / Nurse, adult / Similar devices) []  - 0 Simple Staple / Suture removal (25 or less) []  - 0 Complex Staple / Suture removal (26 or more) []  - 0 Hypo/Hyperglycemic Management (do not  check if billed separately) []  - 0 Ankle / Brachial Index (ABI) - do not check if billed separately Has the patient been  seen at the hospital within the last three years: Yes Total Score: 0 Level Of Care: ____ Electronic Signature(s) Signed: 10/10/2023 8:01:26 AM By: Yevonne Pax RN Entered By: Yevonne Pax on 09/21/2023 07:32:42 -------------------------------------------------------------------------------- Compression Therapy Details Patient Name: Date of Service: [name] 09/21/2023 10:15 A M Medical Record Number: [id number] Patient Account Number: [id number] Date of Birth/Sex: Treating RN: [birth year] Harrington (67 y.o. [name] Primary Care Daren Doswell: Gillermo Murdoch Other Clinician: Referring Margalit Leece: Treating Aleaha Fickling/Extender: [name], [name] [name], [name]: 1 Binney, Latashia S ([id number]) [id number] Page 3 of 9 Compression Therapy Performed for Wound Assessment: Wound #1 Right Lower Leg Performed By: Clinician Yevonne Pax, RN Compression Type: Double Layer Post Procedure Diagnosis Same as Pre-procedure Electronic Signature(s) Signed: 10/10/2023 8:01:26 AM By: Yevonne Pax RN Entered By: Yevonne Pax on 09/21/2023 07:32:58 -------------------------------------------------------------------------------- Encounter Discharge Information Details Patient Name: Date of Service: [name] 09/21/2023 10:15 A M Medical Record Number: [id number] Patient Account Number: [id number] Date of Birth/Sex: Treating RN: [name] (67 y.o. [name] Primary Care Che Rachal: Gillermo Murdoch Other Clinician: Referring Dailee Manalang: Treating Nicholl Onstott/Extender: [name], [name] [name], [name]: 1 Encounter Discharge Information Items Discharge Condition: Stable Ambulatory Status: Ambulatory Discharge Destination: Home Transportation: Private Auto Accompanied By: self Schedule Follow-up Appointment:  Yes Clinical Summary of Care: Electronic Signature(s) Signed: 09/21/2023 11:10:21 AM By: Yevonne Pax RN Entered By: Yevonne Pax on 09/21/2023 08:10:21 -------------------------------------------------------------------------------- Lower Extremity Assessment Details Patient Name: Date of Service: ALNITA, SCHEURER 09/21/2023 10:15 A M Medical Record Number: [id number] Patient Account Number: [id number] Date of Birth/Sex: Treating RN: [birth year]Harrington (67 y.o. [name] Primary Care Elexius Minar: Gillermo Murdoch Other Clinician: Referring Sherial Ebrahim: Treating Josilyn Shippee/Extender: [name], [name] [name], [name]: 1 Edema Assessment Assessed: [Left: No] [Right: No] L[LeftSHVONNE, MARCELINO ([id number])] [Right: [id number] Page 4 of 9] Edema: [Left: Ye] [Right: s] Calf Left: Right: Point of Measurement: 34 cm From Medial Instep 31 cm Ankle Left: Right: Point of Measurement: 9 cm From Medial Instep 22 cm Knee To Floor Left: Right: From Medial Instep 46 cm Vascular Assessment Pulses: Dorsalis Pedis Palpable: [Right:Yes] Extremity colors, hair growth, and conditions: Extremity Color: [Right:Hyperpigmented] Hair Growth on Extremity: [Right:No] Temperature of Extremity: [Right:Warm] Capillary Refill: [Right:< 3 seconds] Dependent Rubor: [Right:No] Blanched when Elevated: [Right:No No] Toe Nail Assessment Left: Right: Thick: Yes Discolored: Yes Deformed: Yes Improper Length and Hygiene: Yes Electronic Signature(s) Signed: 10/10/2023 8:01:26 AM By: Yevonne Pax RN Entered By: Yevonne Pax on 09/21/2023 07:19:49 -------------------------------------------------------------------------------- Multi Wound Chart Details Patient Name: Date of Service: [name] 09/21/2023 10:15 A M Medical Record Number: [id number] Patient Account Number: [id number] Date of Birth/Sex: Treating RN: [birth year]-Mar-Harrington (67 y.o. [name] Primary Care  Remona Boom: Gillermo Murdoch Other Clinician: Referring Alexsa Flaum: Treating Blaire Hodsdon/Extender: [name], [name] [name], [name]: 1 Vital Signs Height(in): 66 Pulse(bpm): 75 Weight(lbs): 188 Blood Pressure(mmHg): 187/95 Body Mass Index(BMI): 30.3 Temperature(F): 98.2 Respiratory Rate(breaths/min): 18 [1:Photos:] [N/A:N/A] Right Lower Leg N/A N/A Wound Location: Trauma N/A N/A Wounding Event: Diabetic Wound/Ulcer of the Lower N/A N/A Primary Etiology: Extremity Chronic Obstructive Pulmonary N/A N/A Comorbid History: Disease (COPD), Congestive Heart Failure, Hypertension, Peripheral Venous Disease, Type II Diabetes, Seizure Disorder 08/30/2023 N/A N/A Date Acquired: 1 N/A N/A Weeks of Treatment: Open N/A N/A Wound Status: No N/A N/A Wound Recurrence: 6.2x4x0.2 N/A N/A Measurements L x W x D (cm) 19.478 N/A N/A A (cm) :  RN: 04-23-Harrington (67 y.o. [name] Primary Care Physician: Gillermo Murdoch Other Clinician: Referring Physician: Treating Physician/Extender: [name], [name] [name], [name]: 1 Hari, Raygen S ([id number]) [id number] Page 8 of 9 Education Assessment Education Provided To: Patient Education Topics  Provided Wound/Skin Impairment: Handouts: Caring for Your Ulcer Methods: Explain/Verbal Responses: State content correctly Electronic Signature(s) Signed: 10/10/2023 8:01:26 AM By: Yevonne Pax RN Entered By: Yevonne Pax on 09/21/2023 07:20:27 -------------------------------------------------------------------------------- Wound Assessment Details Patient Name: Date of Service: [name] 09/21/2023 10:15 A M Medical Record Number: [id number] Patient Account Number: [id number] Date of Birth/Sex: Treating RN: [birth year]Harrington (67 y.o. [name] Primary Care Rebekha Diveley: Gillermo Murdoch Other Clinician: Referring Navreet Bolda: Treating Cinzia Devos/Extender: [name], [name] [name], [name]: 1 Wound Status Wound Number: 1 Primary Diabetic Wound/Ulcer of the Lower Extremity Etiology: Wound Location: Right Lower Leg Wound Open Wounding Event: Trauma Status: Date Acquired: 08/30/2023 Comorbid Chronic Obstructive Pulmonary Disease (COPD), Congestive Heart Weeks Of Treatment: 1 History: Failure, Hypertension, Peripheral Venous Disease, Type II Diabetes, Clustered Wound: No Seizure Disorder Photos Wound Measurements Length: (cm) 6.2 Width: (cm) 4 Depth: (cm) 0.2 Area: (cm) 19.478 Volume: (cm) 3.896 % Reduction in Area: 31.1% % Reduction in Volume: 54.1% Tunneling: No Undermining: No Wound Description Classification: Grade 1 Exudate Amount: Medium Exudate Type: Serosanguineous Anello, Shelsie S ([id number]) Exudate Color: red, brown Foul Odor After Cleansing: No Slough/Fibrino Yes [id number] Page 9 of 9 Wound Bed Granulation Amount: Medium (34-66%) Exposed Structure Granulation Quality: Red Fascia Exposed: No Necrotic Amount: Medium (34-66%) Fat Layer (Subcutaneous Tissue) Exposed: Yes Necrotic Quality: Eschar, Adherent Slough Tendon Exposed: No Muscle Exposed: No Joint Exposed: No Bone Exposed: No Electronic  Signature(s) Signed: 10/10/2023 8:01:26 AM By: Yevonne Pax RN Entered By: Yevonne Pax on 09/21/2023 07:19:08 -------------------------------------------------------------------------------- Vitals Details Patient Name: Date of Service: [name]. 09/21/2023 10:15 A M Medical Record Number: [id number] Patient Account Number: [id number] Date of Birth/Sex: Treating RN: Harrington[birth year] (67 y.o. [name] Primary Care Pakou Rainbow: Gillermo Murdoch Other Clinician: Referring Ziare Orrick: Treating Kenson Groh/Extender: [name], [name] [name], [name]: 1 Vital Signs Time Taken: 10:13 Temperature (F): 98.2 Height (in): 66 Pulse (bpm): 75 Weight (lbs): 188 Respiratory Rate (breaths/min): 18 Body Mass Index (BMI): 30.3 Blood Pressure (mmHg): 187/95 Reference Range: 80 - 120 mg / dl Electronic Signature(s) Signed: 10/10/2023 8:01:26 AM By: Yevonne Pax RN Entered By: Yevonne Pax on 09/21/2023 07:14:16  seen at the hospital within the last three years: Yes Total Score: 0 Level Of Care: ____ Electronic Signature(s) Signed: 10/10/2023 8:01:26 AM By: Yevonne Pax RN Entered By: Yevonne Pax on 09/21/2023 07:32:42 -------------------------------------------------------------------------------- Compression Therapy Details Patient Name: Date of Service: [name] 09/21/2023 10:15 A M Medical Record Number: [id number] Patient Account Number: [id number] Date of Birth/Sex: Treating RN: [birth year], Harrington (67 y.o. [name] Primary Care Daren Doswell: Gillermo Murdoch Other Clinician: Referring Margalit Leece: Treating Aleaha Fickling/Extender: [name], [name] [name], [name]: 1 Binney, Latashia S ([id number]) [id number] Page 3 of 9 Compression Therapy Performed for Wound Assessment: Wound #1 Right Lower Leg Performed By: Clinician Yevonne Pax, RN Compression Type: Double Layer Post Procedure Diagnosis Same as Pre-procedure Electronic Signature(s) Signed: 10/10/2023 8:01:26 AM By: Yevonne Pax RN Entered By: Yevonne Pax on 09/21/2023 07:32:58 -------------------------------------------------------------------------------- Encounter Discharge Information Details Patient Name: Date of Service: [name] 09/21/2023 10:15 A M Medical Record Number: [id number] Patient Account Number: [id number] Date of Birth/Sex: Treating RN: [name] (67 y.o. [name] Primary Care Che Rachal: Gillermo Murdoch Other Clinician: Referring Dailee Manalang: Treating Nicholl Onstott/Extender: [name], [name] [name], [name]: 1 Encounter Discharge Information Items Discharge Condition: Stable Ambulatory Status: Ambulatory Discharge Destination: Home Transportation: Private Auto Accompanied By: self Schedule Follow-up Appointment:  Yes Clinical Summary of Care: Electronic Signature(s) Signed: 09/21/2023 11:10:21 AM By: Yevonne Pax RN Entered By: Yevonne Pax on 09/21/2023 08:10:21 -------------------------------------------------------------------------------- Lower Extremity Assessment Details Patient Name: Date of Service: ALNITA, SCHEURER 09/21/2023 10:15 A M Medical Record Number: [id number] Patient Account Number: [id number] Date of Birth/Sex: Treating RN: [birth year]Harrington (67 y.o. [name] Primary Care Elexius Minar: Gillermo Murdoch Other Clinician: Referring Sherial Ebrahim: Treating Josilyn Shippee/Extender: [name], [name] [name], [name]: 1 Edema Assessment Assessed: [Left: No] [Right: No] L[LeftSHVONNE, MARCELINO ([id number])] [Right: [id number] Page 4 of 9] Edema: [Left: Ye] [Right: s] Calf Left: Right: Point of Measurement: 34 cm From Medial Instep 31 cm Ankle Left: Right: Point of Measurement: 9 cm From Medial Instep 22 cm Knee To Floor Left: Right: From Medial Instep 46 cm Vascular Assessment Pulses: Dorsalis Pedis Palpable: [Right:Yes] Extremity colors, hair growth, and conditions: Extremity Color: [Right:Hyperpigmented] Hair Growth on Extremity: [Right:No] Temperature of Extremity: [Right:Warm] Capillary Refill: [Right:< 3 seconds] Dependent Rubor: [Right:No] Blanched when Elevated: [Right:No No] Toe Nail Assessment Left: Right: Thick: Yes Discolored: Yes Deformed: Yes Improper Length and Hygiene: Yes Electronic Signature(s) Signed: 10/10/2023 8:01:26 AM By: Yevonne Pax RN Entered By: Yevonne Pax on 09/21/2023 07:19:49 -------------------------------------------------------------------------------- Multi Wound Chart Details Patient Name: Date of Service: [name] 09/21/2023 10:15 A M Medical Record Number: [id number] Patient Account Number: [id number] Date of Birth/Sex: Treating RN: 20-Mar-Harrington (67 y.o. [name] Primary Care  Remona Boom: Gillermo Murdoch Other Clinician: Referring Alexsa Flaum: Treating Blaire Hodsdon/Extender: [name], [name] [name], [name]: 1 Vital Signs Height(in): 66 Pulse(bpm): 75 Weight(lbs): 188 Blood Pressure(mmHg): 187/95 Body Mass Index(BMI): 30.3 Temperature(F): 98.2 Respiratory Rate(breaths/min): 18 [1:Photos:] [N/A:N/A] Right Lower Leg N/A N/A Wound Location: Trauma N/A N/A Wounding Event: Diabetic Wound/Ulcer of the Lower N/A N/A Primary Etiology: Extremity Chronic Obstructive Pulmonary N/A N/A Comorbid History: Disease (COPD), Congestive Heart Failure, Hypertension, Peripheral Venous Disease, Type II Diabetes, Seizure Disorder 08/30/2023 N/A N/A Date Acquired: 1 N/A N/A Weeks of Treatment: Open N/A N/A Wound Status: No N/A N/A Wound Recurrence: 6.2x4x0.2 N/A N/A Measurements L x W x D (cm) 19.478 N/A N/A A (cm) :

## 2023-10-10 NOTE — Progress Notes (Signed)
Established Patient Office Visit  Subjective:  Patient ID: [name], female    DOB: 10-19-1956  Age: 67 y.o. MRN: [id number]  Chief Complaint  Patient presents with   Follow-up    Loose stools    C/o loose stools x several months with associated constipation. Denies any blood in the stool but admits to occasional lower abdominal pain. No improvement with otc imodium.    No other concerns at this time.   Past Medical History:  Diagnosis Date   (HFpEF) heart failure with preserved ejection fraction (HCC)    Acute renal failure (HCC) 03/29/2011   Acute respiratory distress 11/2020   Acute respiratory failure (HCC) 04/14/2014   Anxiety    Arthritis    CHF (congestive heart failure) (HCC)    Chronic respiratory failure with hypoxia and hypercapnia (HCC)    Community acquired pneumonia 01/10/2013   COPD (chronic obstructive pulmonary disease) (HCC)    no inhalers--smoker, no oxygen   Depression    Dyspnea    Emphysema lung (HCC)    GERD (gastroesophageal reflux disease)    Hyperlipidemia    Hypertension    Hypothyroid    Peripheral vascular disease (HCC)    Pre-diabetes    Restless leg syndrome    Seizures (HCC)    02/13/2011 -hx of seizure due to "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  no seizure since--pt does take keppra and office note from neurologist dr. Marjory Lies on this chart   Urinary incontinence     Past Surgical History:  Procedure Laterality Date   CARPAL TUNNEL RELEASE     left   COLONOSCOPY WITH PROPOFOL N/A 06/17/2022   Procedure: COLONOSCOPY WITH PROPOFOL;  Surgeon: Regis Bill, MD;  Location: [facility];  Service: Endoscopy;  Laterality: N/A;   JOINT REPLACEMENT  08/26/2011   right total knee arthroplasty, left total knee   KNEE ARTHROPLASTY  10/28/2011   Procedure: COMPUTER ASSISTED TOTAL KNEE ARTHROPLASTY;  Surgeon: Carlisle Beers Rendall  III;  Location: [facility];  Service: Orthopedics;  Laterality: Left;  preop femoral nerve block   KNEE SURGERY     right   laminectomy and diskectomy     L4-5 with fusion   PROCTOSCOPY N/A 06/18/2022   Procedure: RIGID PROCTOSCOPY;  Surgeon: Karie Soda, MD;  Location: [facility];  Service: General;  Laterality: N/A;    Social History   Socioeconomic History   Marital status: Widowed    Spouse name: Not on file   Number of children: Not on file   Years of education: Not on file   Highest education level: Not on file  Occupational History   Not on file  Tobacco Use   Smoking status: Every Day    Current packs/day: 2.00    Average packs/day: 2.0 packs/day for 50.0 years (100.0 ttl pk-yrs)    Types: Cigarettes   Smokeless tobacco: Never  Vaping Use   Vaping status: Every Day   Substances: Nicotine  Substance and Sexual Activity   Alcohol use: Yes    Comment: occasional   Drug use: No   Sexual activity: Yes    Birth control/protection: Post-menopausal  Other Topics Concern   Not on file  Social History Narrative   ** Merged History Encounter **       Social Determinants of Health   Financial Resource Strain: Low Risk  (08/10/2023)   Received from  [facility]   Overall Financial Resource Strain (CARDIA)    Difficulty of Paying Living Expenses: Not hard at all  Food Insecurity: No Food Insecurity (08/10/2023)   [facility]   Hunger Vital Sign    Worried About Running Out of Food in the Last Year: Never true    Ran Out of Food in the Last Year: Never true  Transportation Needs: No Transportation Needs (08/10/2023)   Received from [facility] - Transportation    In the past 12 months, has lack of transportation kept you from medical appointments or from getting medications?: No    Lack of Transportation (Non-Medical): No  Physical Activity: Not on file  Stress: Not on file  Social Connections: Not on  file  Intimate Partner Violence: Not At Risk (04/27/2023)   Humiliation, Afraid, Rape, and Kick questionnaire    Fear of Current or Ex-Partner: No    Emotionally Abused: No    Physically Abused: No    Sexually Abused: No    Family History  Problem Relation Age of Onset   Heart attack Father 39   Alzheimer'Jayce Kainz disease Mother    Breast cancer Neg Hx     Allergies  Allergen Reactions   Nsaids Other (See Comments)    Pt states it messes up her kidneys But takes aspirin & ibuprofen   Thiazide-Type Diuretics     Other reaction(Illianna Paschal): Unknown    Review of Systems  Constitutional: Negative.   HENT: Negative.    Eyes: Negative.   Respiratory:  Positive for shortness of breath (on exertion).   Cardiovascular: Negative.   Gastrointestinal:  Positive for constipation.  Genitourinary: Negative.   Skin: Negative.   Neurological:  Positive for headaches (chronic).  Endo/Heme/Allergies: Negative.        Objective:   BP 130/80   Pulse 70   Ht 5\' 6"  (1.676 m)   Wt 185 lb 3.2 oz (84 kg)   SpO2 90%   BMI 29.89 kg/m   Vitals:   10/10/23 1313  BP: 130/80  Pulse: 70  Height: 5\' 6"  (1.676 m)  Weight: 185 lb 3.2 oz (84 kg)  SpO2: 90%  BMI (Calculated): 29.91    Physical Exam Vitals reviewed.  Constitutional:      General: She is not in acute distress. HENT:     Head: Normocephalic.     Nose: Nose normal.     Mouth/Throat:     Mouth: Mucous membranes are moist.  Eyes:     Extraocular Movements: Extraocular movements intact.     Pupils: Pupils are equal, round, and reactive to light.  Cardiovascular:     Rate and Rhythm: Normal rate and regular rhythm.     Heart sounds: No murmur heard. Pulmonary:     Effort: Pulmonary effort is normal.     Breath sounds: Decreased air movement present. No rhonchi or rales.  Abdominal:     General: Abdomen is flat.     Palpations: There is no hepatomegaly, splenomegaly or mass.     Tenderness: There is abdominal tenderness in the  epigastric area and suprapubic area. There is no guarding or rebound.  Musculoskeletal:        General: Normal range of motion.     Cervical back: Normal range of motion. No tenderness.     Comments: Boot in place  Skin:    General: Skin is warm and dry.     Comments: Onychomycosis  of right big toenail. Right leg wound dressed.  Neurological:     General: No focal deficit present.     Mental Status: She is alert and oriented to person, place, and time.     Cranial Nerves: No cranial nerve deficit.     Motor: No weakness.  Psychiatric:        Mood and Affect: Mood normal.        Behavior: Behavior normal.      Results for orders placed or performed in visit on 10/10/23  POCT CBG (Fasting - Glucose)  Result Value Ref Range   Glucose Fasting, POC 105 (A) 70 - 99 mg/dL    Recent Results (from the past 2160 hour(Graison Leinberger))  POCT CBG (Fasting - Glucose)     Status: Abnormal   Collection Time: 10/10/23  1:18 PM  Result Value Ref Range   Glucose Fasting, POC 105 (A) 70 - 99 mg/dL      Assessment & Plan:  As per problem list.  Problem List Items Addressed This Visit       Endocrine   Type 2 diabetes mellitus with peripheral neuropathy (HCC) - Primary   Relevant Orders   POCT CBG (Fasting - Glucose) (Completed)   Other Visit Diagnoses     Encopresis with constipation and overflow incontinence       Relevant Orders   DG Abd 2 Views       Return if symptoms worsen or fail to improve.   Total time spent: 30 minutes  Luna Fuse, MD  10/10/2023   This document may have been prepared by [facility] Voice Recognition software and as such may include unintentional dictation errors.

## 2023-10-11 ENCOUNTER — Other Ambulatory Visit: Payer: Self-pay | Admitting: Internal Medicine

## 2023-10-11 ENCOUNTER — Ambulatory Visit (INDEPENDENT_AMBULATORY_CARE_PROVIDER_SITE_OTHER): Payer: 59

## 2023-10-11 ENCOUNTER — Other Ambulatory Visit: Payer: 59

## 2023-10-11 ENCOUNTER — Telehealth (HOSPITAL_COMMUNITY): Payer: Self-pay | Admitting: *Deleted

## 2023-10-11 DIAGNOSIS — R079 Chest pain, unspecified: Secondary | ICD-10-CM

## 2023-10-11 DIAGNOSIS — R159 Full incontinence of feces: Secondary | ICD-10-CM | POA: Diagnosis not present

## 2023-10-11 DIAGNOSIS — E1142 Type 2 diabetes mellitus with diabetic polyneuropathy: Secondary | ICD-10-CM | POA: Diagnosis not present

## 2023-10-11 DIAGNOSIS — I10 Essential (primary) hypertension: Secondary | ICD-10-CM | POA: Diagnosis not present

## 2023-10-11 DIAGNOSIS — R0602 Shortness of breath: Secondary | ICD-10-CM

## 2023-10-11 DIAGNOSIS — E039 Hypothyroidism, unspecified: Secondary | ICD-10-CM

## 2023-10-11 MED ORDER — METOPROLOL TARTRATE 100 MG PO TABS: ORAL_TABLET | ORAL | 0 refills | Status: DC

## 2023-10-11 NOTE — Telephone Encounter (Signed)
Reaching out to patient to offer assistance regarding upcoming cardiac imaging study; pt verbalizes understanding of appt date/time, parking situation and where to check in, pre-test NPO status and medications ordered, and verified current allergies; name and call back number provided for further questions should they arise Hayley Sharpe RN Navigator Cardiac Imaging [facility] [phone] office [phone] cell  

## 2023-10-12 ENCOUNTER — Encounter: Payer: 59 | Admitting: Internal Medicine

## 2023-10-12 ENCOUNTER — Telehealth: Payer: Self-pay | Admitting: Internal Medicine

## 2023-10-12 ENCOUNTER — Ambulatory Visit
Admission: RE | Admit: 2023-10-12 | Discharge: 2023-10-12 | Disposition: A | Discharge status: Home / Self Care | Payer: 59 | Source: Ambulatory Visit | Attending: Internal Medicine | Admitting: Internal Medicine

## 2023-10-12 DIAGNOSIS — I87321 Chronic venous hypertension (idiopathic) with inflammation of right lower extremity: Secondary | ICD-10-CM | POA: Diagnosis not present

## 2023-10-12 DIAGNOSIS — S80811D Abrasion, right lower leg, subsequent encounter: Secondary | ICD-10-CM | POA: Diagnosis not present

## 2023-10-12 DIAGNOSIS — I2510 Atherosclerotic heart disease of native coronary artery without angina pectoris: Secondary | ICD-10-CM | POA: Insufficient documentation

## 2023-10-12 DIAGNOSIS — N1832 Chronic kidney disease, stage 3b: Secondary | ICD-10-CM | POA: Diagnosis not present

## 2023-10-12 DIAGNOSIS — E11622 Type 2 diabetes mellitus with other skin ulcer: Secondary | ICD-10-CM | POA: Diagnosis not present

## 2023-10-12 DIAGNOSIS — R079 Chest pain, unspecified: Secondary | ICD-10-CM | POA: Diagnosis not present

## 2023-10-12 DIAGNOSIS — F17210 Nicotine dependence, cigarettes, uncomplicated: Secondary | ICD-10-CM | POA: Diagnosis not present

## 2023-10-12 DIAGNOSIS — R0602 Shortness of breath: Secondary | ICD-10-CM | POA: Diagnosis not present

## 2023-10-12 DIAGNOSIS — J449 Chronic obstructive pulmonary disease, unspecified: Secondary | ICD-10-CM | POA: Diagnosis not present

## 2023-10-12 DIAGNOSIS — E1122 Type 2 diabetes mellitus with diabetic chronic kidney disease: Secondary | ICD-10-CM | POA: Diagnosis not present

## 2023-10-12 DIAGNOSIS — L97812 Non-pressure chronic ulcer of other part of right lower leg with fat layer exposed: Secondary | ICD-10-CM | POA: Diagnosis not present

## 2023-10-12 DIAGNOSIS — L97818 Non-pressure chronic ulcer of other part of right lower leg with other specified severity: Secondary | ICD-10-CM | POA: Diagnosis not present

## 2023-10-12 DIAGNOSIS — I130 Hypertensive heart and chronic kidney disease with heart failure and stage 1 through stage 4 chronic kidney disease, or unspecified chronic kidney disease: Secondary | ICD-10-CM | POA: Diagnosis not present

## 2023-10-12 DIAGNOSIS — I509 Heart failure, unspecified: Secondary | ICD-10-CM | POA: Diagnosis not present

## 2023-10-12 LAB — COMPREHENSIVE METABOLIC PANEL
ALT: 19 [IU]/L (ref 0–32)
AST: 24 [IU]/L (ref 0–40)
Albumin: 4.1 g/dL (ref 3.9–4.9)
Alkaline Phosphatase: 24 [IU]/L — ABNORMAL LOW (ref 44–121)
BUN/Creatinine Ratio: 7 — ABNORMAL LOW (ref 12–28)
BUN: 9 mg/dL (ref 8–27)
Bilirubin Total: 0.4 mg/dL (ref 0.0–1.2)
CO2: 24 mmol/L (ref 20–29)
Calcium: 10 mg/dL (ref 8.7–10.3)
Chloride: 96 mmol/L (ref 96–106)
Creatinine, Ser: 1.21 mg/dL — ABNORMAL HIGH (ref 0.57–1.00)
Globulin, Total: 2.5 g/dL (ref 1.5–4.5)
Glucose: 95 mg/dL (ref 70–99)
Potassium: 4.8 mmol/L (ref 3.5–5.2)
Sodium: 134 mmol/L (ref 134–144)
Total Protein: 6.6 g/dL (ref 6.0–8.5)
eGFR: 49 mL/min/{1.73_m2} — ABNORMAL LOW (ref >59)

## 2023-10-12 LAB — LIPID PANEL
Chol/HDL Ratio: 2.4 ratio (ref 0.0–4.4)
Cholesterol, Total: 149 mg/dL (ref 100–199)
HDL: 61 mg/dL (ref >39)
LDL Chol Calc (NIH): 69 mg/dL (ref 0–99)
Triglycerides: 103 mg/dL (ref 0–149)
VLDL Cholesterol Cal: 19 mg/dL (ref 5–40)

## 2023-10-12 LAB — HEMOGLOBIN A1C
Est. average glucose Bld gHb Est-mCnc: 108 mg/dL
Hgb A1c MFr Bld: 5.4 % (ref 4.8–5.6)

## 2023-10-12 LAB — TSH: TSH: 1.53 u[IU]/mL (ref 0.450–4.500)

## 2023-10-12 MED ORDER — NITROGLYCERIN 0.4 MG SL SUBL
0.8000 mg | SUBLINGUAL_TABLET | Freq: Once | SUBLINGUAL | Status: AC
  Administered 2023-10-12: 0.8 mg via SUBLINGUAL

## 2023-10-12 MED ORDER — IOHEXOL 350 MG/ML SOLN
100.0000 mL | Freq: Once | INTRAVENOUS | Status: AC | PRN
  Administered 2023-10-12: 100 mL via INTRAVENOUS

## 2023-10-12 NOTE — Telephone Encounter (Signed)
Patient left VM requesting a call back. Did not state what she is needing.

## 2023-10-12 NOTE — Telephone Encounter (Signed)
Patient was called and stated that she "was able to figure out who prescribed the medication and it was my kidney doctor." Has no needs at this time.

## 2023-10-12 NOTE — Progress Notes (Signed)
Patient tolerated procedure well. Ambulate w/o difficulty. Denies any lightheadedness or being dizzy. Pt denies any pain at this time. Sitting in chair, pt is encouraged to drink additional water throughout the day and reason explained to patient. Patient verbalized understanding and all questions answered. ABC intact. No further needs at this time. Discharge from procedure area w/o issues.  

## 2023-10-13 NOTE — Progress Notes (Signed)
not tissue or cotton balls. Prim Dressing: Hydrofera Blue Ready Transfer Foam, 4x5 (in/in) 1 x Per Week/30 Days ary Discharge Instructions: Apply Hydrofera Blue Ready to wound bed as directed Secondary Dressing: ABD Pad 5x9 (in/in) 1 x Per Week/30 Days Discharge Instructions: Cover with ABD pad Com pression Wrap: Urgo K2 Lite, two layer compression system, regular 1 x Per Week/30 Days 1 only a small area of this remains without surface epithelium. 2. Continued use of Hydrofera Blue and continued compression 3. I spoke to her about compression stockings we will give her leg measurements for elastic therapy in [place] Electronic Signature(s) Signed: 10/12/2023 4:47:46 PM By: Baltazar Najjar MD Entered By: Baltazar Najjar on 10/12/2023 09:57:39 -------------------------------------------------------------------------------- SuperBill Details Patient Name: Date of Service: [name], [name] 10/12/2023 Medical Record Number: [id number] Patient Account Number: [id number] Date of Birth/Sex: Treating RN: 05-14-1956 (67 y.o. 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 Facility Procedures : CPT4 Code: [id number] Description: (Facility Use Only) [phone] - APPLY MULTLAY COMPRS LWR RT LEG Modifier: Quantity: 1 Physician Procedures : CPT4 Code Description Modifier [id number] - WC PHYS LEVEL 3 - EST PT ICD-10 Diagnosis Description L97.818 Non-pressure chronic ulcer of other part of right lower leg with other specified severity I87.321 Chronic venous hypertension (idiopathic) with  inflammation of right lower extremity S80.811D Abrasion, right lower leg, subsequent encounter Quantity: 1 Electronic Signature(sALEAHA, VARMA ([id number]) [id number] Page 6 of 6 Signed: 10/12/2023 4:47:46 PM By: Baltazar Najjar MD Entered By: Baltazar Najjar on 10/12/2023 09:57:59  QUINLYNN, DITTMAR ([id number]) [id number] Page 1 of 6 Visit Report for 10/12/2023 HPI Details Patient Name: Date of Service: [name] 10/12/2023 12:45 PM Medical Record Number: [id number] Patient Account Number: [id number] Date of Birth/Sex: Treating RN: [birth year] (67 y.o. [name] Primary Care Provider: Gillermo Murdoch Other Clinician: Referring Provider: Treating Provider/Extender: [name], [name] [name], [name]: 4 History of Present Illness HPI Description: ADMISSION 09/14/2023 This is a 67 year old woman whose problem began around 9 September she was stepping up onto a curb fell and suffered a skin tear substantially on the right lower leg. She was seen in by primary care given mupirocin and a course of cefadroxil. The cefadroxil was for 14 days. She is still applying the the mupirocin. She does not have a prior wound history in this leg although she probably has some degree of chronic venous insufficiency Past medical history includes type 2 diabetes in the chart although the patient claims she is prediabetic, chronic kidney disease stage IIIb, varicose veins followed previously by vascular vascular surgery, hypertension, headaches, CHF, COPD, gastroesophageal reflux disease, peripheral vascular disease and degenerative disc disease ABI in the right was 0.81 in our clinic. Patient is a 1-1/2 pack/day smoker 10/9; patient I admitted to the clinic last week suffering a substantial skin tear on the right lower leg. Some of this probably was a hematoma at the time. She has had wounds on her legs before usually traumatic that have healed last week we applied Hydrofera Blue with Urgo K2 light compression. She is tolerated this well and the wound is improved 10/16; patient with a substantial skin tear on the right lower leg in the setting of chronic venous insufficiency with probably an underlying hematoma at some point. We  have been using Hydrofera Blue under Urgo light compression. She is tolerating this well and we have substantial improvement 10/22; this wound continues to contract quite nicely. Skin tear on the right lateral lower leg in the setting of chronic venous insufficiency. We have been using Hydrofera Blue under and Urgo light compression. I think this will be healed in 2 to 3 weeks 10/30; continued improvement we have been using Hydrofera Blue under Urgo K2 lite. Measurements are smaller Electronic Signature(s) Signed: 10/12/2023 4:47:46 PM By: Baltazar Najjar MD Entered By: Baltazar Najjar on 10/12/2023 09:55:28 -------------------------------------------------------------------------------- Physical Exam Details Patient Name: Date of Service: [name], [name] 10/12/2023 12:45 PM Medical Record Number: [id number] Patient Account Number: [id number] Date of Birth/Sex: Treating RN: 1956/08/27 (67 y.o. [name] Primary Care Provider: Gillermo Murdoch Other Clinician: Referring Provider: Treating Provider/Extender: Chauncey Mann, [name] [name], [name]: 4 Bogden, Joleah S ([id number]) [id number] Page 2 of 6 Constitutional Sitting or standing Blood Pressure is within target range for patient.. Pulse regular and within target range for patient.Marland Kitchen Respirations regular, non-labored and within target range.. Temperature is normal and within the target range for the patient.Marland Kitchen appears in no distress. Notes Wound exam; skin tear on the right anterior lower leg in the setting of chronic venous insufficiency. We have good edema control. Healthy granulation the vast majority of this is epithelialized Electronic Signature(s) Signed: 10/12/2023 4:47:46 PM By: Baltazar Najjar MD Entered By: Baltazar Najjar on 10/12/2023 09:56:31 -------------------------------------------------------------------------------- Physician Orders Details Patient Name: Date of  Service: SITLALY, PANZER 10/12/2023 12:45 PM Medical Record Number: [id number] Patient Account Number: [id number] Date of Birth/Sex: Treating RN: 05-[name]-1956 (67 y.o. [name] Primary Care Provider: Gillermo Murdoch Other  Clinician: Referring Provider: Treating Provider/Extender: [name], [name] G Tejan-Sie, [name]: 4 The following information was scribed by: Yevonne Pax The information was scribed for: Maxwell Caul Verbal / Phone Orders: No Diagnosis Coding Follow-up Appointments Return Appointment in 1 week. Bathing/ Shower/ Hygiene May shower with wound dressing protected with water repellent cover or cast protector. Anesthetic (Use 'Patient Medications' Section for Anesthetic Order Entry) Lidocaine applied to wound bed Wound Treatment Wound #1 - Lower Leg Wound Laterality: Right Cleanser: Soap and Water 1 x Per Week/30 Days Discharge Instructions: Gently cleanse wound with antibacterial soap, rinse and pat dry prior to dressing wounds Cleanser: Wound Cleanser 1 x Per Week/30 Days Discharge Instructions: Wash your hands with soap and water. Remove old dressing, discard into plastic bag and place into trash. Cleanse the wound with Wound Cleanser prior to applying a clean dressing using gauze sponges, not tissues or cotton balls. Do not scrub or use excessive force. Pat dry using gauze sponges, not tissue or cotton balls. Prim Dressing: Hydrofera Blue Ready Transfer Foam, 4x5 (in/in) 1 x Per Week/30 Days ary Discharge Instructions: Apply Hydrofera Blue Ready to wound bed as directed Secondary Dressing: ABD Pad 5x9 (in/in) 1 x Per Week/30 Days Discharge Instructions: Cover with ABD pad Compression Wrap: Urgo K2 Lite, two layer compression system, regular 1 x Per Week/30 Days Electronic Signature(s) Signed: 10/12/2023 3:01:32 PM By: Yevonne Pax RN Signed: 10/12/2023 4:47:46 PM By: Baltazar Najjar MD Entered By: Yevonne Pax on 10/12/2023  12:01:32 [name] ([id number]) [id number] Page 3 of 6 -------------------------------------------------------------------------------- Problem List Details Patient Name: Date of Service: [name], [name] 10/12/2023 12:45 PM Medical Record Number: [id number] Patient Account Number: [id number] Date of Birth/Sex: Treating RN: July 20, 1956 (67 y.o. [name] Primary Care Provider: Gillermo Murdoch Other Clinician: Referring Provider: Treating Provider/Extender: [name], [name] [name], [name]: 4 Active Problems ICD-10 Encounter Code Description Active Date MDM Diagnosis L97.818 Non-pressure chronic ulcer of other part of right lower leg with other specified 09/14/2023 No Yes severity S80.811D Abrasion, right lower leg, subsequent encounter 09/14/2023 No Yes I87.321 Chronic venous hypertension (idiopathic) with inflammation of right lower 09/14/2023 No Yes extremity Inactive Problems Resolved Problems Electronic Signature(s) Signed: 10/12/2023 4:47:46 PM By: Baltazar Najjar MD Entered By: Baltazar Najjar on 10/12/2023 09:54:59 -------------------------------------------------------------------------------- Progress Note Details Patient Name: Date of Service: [name] 10/12/2023 12:45 PM Medical Record Number: [id number] Patient Account Number: [id number] Date of Birth/Sex: Treating RN: [birth year] (67 y.o. [name] Primary Care Provider: Gillermo Murdoch Other Clinician: Referring Provider: Treating Provider/Extender: [name], [name] [name], [name]: 4 Subjective Abbs, Eileen S ([id number]) [id number] Page 4 of 6 History of Present Illness (HPI) ADMISSION 09/14/2023 This is a 67 year old woman whose problem began around 9 September she was stepping up onto a curb fell and suffered a skin tear substantially on the right lower leg. She was seen in by  primary care given mupirocin and a course of cefadroxil. The cefadroxil was for 14 days. She is still applying the the mupirocin. She does not have a prior wound history in this leg although she probably has some degree of chronic venous insufficiency Past medical history includes type 2 diabetes in the chart although the patient claims she is prediabetic, chronic kidney disease stage IIIb, varicose veins followed previously by vascular vascular surgery, hypertension, headaches, CHF, COPD, gastroesophageal reflux disease, peripheral vascular disease and degenerative disc disease ABI in the  not tissue or cotton balls. Prim Dressing: Hydrofera Blue Ready Transfer Foam, 4x5 (in/in) 1 x Per Week/30 Days ary Discharge Instructions: Apply Hydrofera Blue Ready to wound bed as directed Secondary Dressing: ABD Pad 5x9 (in/in) 1 x Per Week/30 Days Discharge Instructions: Cover with ABD pad Com pression Wrap: Urgo K2 Lite, two layer compression system, regular 1 x Per Week/30 Days 1 only a small area of this remains without surface epithelium. 2. Continued use of Hydrofera Blue and continued compression 3. I spoke to her about compression stockings we will give her leg measurements for elastic therapy in [place] Electronic Signature(s) Signed: 10/12/2023 4:47:46 PM By: Baltazar Najjar MD Entered By: Baltazar Najjar on 10/12/2023 09:57:39 -------------------------------------------------------------------------------- SuperBill Details Patient Name: Date of Service: [name], [name] 10/12/2023 Medical Record Number: [id number] Patient Account Number: [id number] Date of Birth/Sex: Treating RN: 05-14-1956 (67 y.o. 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 Facility Procedures : CPT4 Code: [id number] Description: (Facility Use Only) [phone] - APPLY MULTLAY COMPRS LWR RT LEG Modifier: Quantity: 1 Physician Procedures : CPT4 Code Description Modifier [id number] - WC PHYS LEVEL 3 - EST PT ICD-10 Diagnosis Description L97.818 Non-pressure chronic ulcer of other part of right lower leg with other specified severity I87.321 Chronic venous hypertension (idiopathic) with  inflammation of right lower extremity S80.811D Abrasion, right lower leg, subsequent encounter Quantity: 1 Electronic Signature(sALEAHA, VARMA ([id number]) [id number] Page 6 of 6 Signed: 10/12/2023 4:47:46 PM By: Baltazar Najjar MD Entered By: Baltazar Najjar on 10/12/2023 09:57:59

## 2023-10-13 NOTE — Progress Notes (Signed)
Birth/Gender: Treating RN: Sep 22, 1956 (67 y.o. [name] Primary Care Physician: Gillermo Murdoch Other Clinician: Referring Physician: Treating Physician/Extender: Chauncey Mann, [name] [name], [name]: 4 [name], [name] ([id number])  [id number] Page 8 of 10 Education Assessment Education Provided To: Patient Education Topics Provided Wound/Skin Impairment: Handouts: Caring for Your Ulcer Methods: Explain/Verbal Responses: State content correctly Electronic Signature(Harrington) Signed: 10/13/2023 3:34:18 PM By: Yevonne Pax RN Entered By: Yevonne Pax on 10/12/2023 09:52:17 -------------------------------------------------------------------------------- Wound Assessment Details Patient Name: Date of Service: [name], [name] 10/12/2023 12:45 PM Medical Record Number: [id number] Patient Account Number: [id number] Date of Birth/Sex: Treating RN: 07/08/1956 (67 y.o. [name] Primary Care [name]: Gillermo Murdoch Other Clinician: Referring [name]: Treating [name]/Extender: [name], [name] [name], [name]: 4 Wound Status Wound Number: 1 Primary Diabetic Wound/Ulcer of the Lower Extremity Etiology: Wound Location: Right Lower Leg Wound Open Wounding Event: Trauma Status: Date Acquired: 08/30/2023 Comorbid Chronic Obstructive Pulmonary Disease (COPD), Congestive Heart Weeks Of Treatment: 4 History: Failure, Hypertension, Peripheral Venous Disease, Type II Diabetes, Clustered Wound: No Seizure Disorder Photos Wound Measurements Length: (cm) 1 Width: (cm) 1 Depth: (cm) 0.1 Area: (cm) 0.785 Volume: (cm) 0.079 % Reduction in Area: 97.2% % Reduction in Volume: 99.1% Epithelialization: Medium (34-66%) Tunneling: No Undermining: No Wound Description Classification: Grade 1 Exudate Amount: Medium Exudate Type: Serosanguineous [name] ([id number]) Exudate Color: red, brown Foul Odor After Cleansing: No Slough/Fibrino No [id number] Page 9 of 10 Wound Bed Granulation Amount: Large (67-100%) Exposed Structure Granulation Quality: Red Fascia Exposed: No Necrotic Amount: None Present (0%) Fat Layer (Subcutaneous Tissue)  Exposed: Yes Tendon Exposed: No Muscle Exposed: No Joint Exposed: No Bone Exposed: No Treatment Notes Wound #1 (Lower Leg) Wound Laterality: Right Cleanser Soap and Water Discharge Instruction: Gently cleanse wound with antibacterial soap, rinse and pat dry prior to dressing wounds Wound Cleanser Discharge Instruction: Wash your hands with soap and water. Remove old dressing, discard into plastic bag and place into trash. Cleanse the wound with Wound Cleanser prior to applying a clean dressing using gauze sponges, not tissues or cotton balls. Do not scrub or use excessive force. Pat dry using gauze sponges, not tissue or cotton balls. Peri-Wound Care Topical Primary Dressing Hydrofera Blue Ready Transfer Foam, 4x5 (in/in) Discharge Instruction: Apply Hydrofera Blue Ready to wound bed as directed Secondary Dressing ABD Pad 5x9 (in/in) Discharge Instruction: Cover with ABD pad Secured With Compression Wrap Urgo K2 Lite, two layer compression system, regular Compression Stockings Add-Ons Electronic Signature(Harrington) Signed: 10/13/2023 3:34:18 PM By: Yevonne Pax RN Entered By: Yevonne Pax on 10/12/2023 09:50:34 -------------------------------------------------------------------------------- Vitals Details Patient Name: Date of Service: [name] 10/12/2023 12:45 PM Medical Record Number: [id number] Patient Account Number: [id number] Date of Birth/Sex: Treating RN: 01-01-1956 (67 y.o. [name] Primary Care Paige Monarrez: Gillermo Murdoch Other Clinician: Referring Renner Sebald: Treating Venecia Mehl/Extender: [name], [name] [name], [name]: 4 Vital Signs Time Taken: 12:45 Temperature (F): 98.4 Height (in): 66 Pulse (bpm): 66 Weight (lbs): 188 Respiratory Rate (breaths/min): 18 Body Mass Index (BMI): 30.3 Blood Pressure (mmHg): 140/71 [name], [name] ([id number]) [id number] Page 10 of 10 Reference Range: 80 - 120 mg /  dl Electronic Signature(Harrington) Signed: 10/13/2023 3:34:18 PM By: Yevonne Pax RN Entered By: Yevonne Pax on 10/12/2023 09:45:56  Birth/Gender: Treating RN: Sep 22, 1956 (67 y.o. [name] Primary Care Physician: Gillermo Murdoch Other Clinician: Referring Physician: Treating Physician/Extender: Chauncey Mann, [name] [name], [name]: 4 [name], [name] ([id number])  [id number] Page 8 of 10 Education Assessment Education Provided To: Patient Education Topics Provided Wound/Skin Impairment: Handouts: Caring for Your Ulcer Methods: Explain/Verbal Responses: State content correctly Electronic Signature(Harrington) Signed: 10/13/2023 3:34:18 PM By: Yevonne Pax RN Entered By: Yevonne Pax on 10/12/2023 09:52:17 -------------------------------------------------------------------------------- Wound Assessment Details Patient Name: Date of Service: [name], [name] 10/12/2023 12:45 PM Medical Record Number: [id number] Patient Account Number: [id number] Date of Birth/Sex: Treating RN: 07/08/1956 (67 y.o. [name] Primary Care [name]: Gillermo Murdoch Other Clinician: Referring [name]: Treating [name]/Extender: [name], [name] [name], [name]: 4 Wound Status Wound Number: 1 Primary Diabetic Wound/Ulcer of the Lower Extremity Etiology: Wound Location: Right Lower Leg Wound Open Wounding Event: Trauma Status: Date Acquired: 08/30/2023 Comorbid Chronic Obstructive Pulmonary Disease (COPD), Congestive Heart Weeks Of Treatment: 4 History: Failure, Hypertension, Peripheral Venous Disease, Type II Diabetes, Clustered Wound: No Seizure Disorder Photos Wound Measurements Length: (cm) 1 Width: (cm) 1 Depth: (cm) 0.1 Area: (cm) 0.785 Volume: (cm) 0.079 % Reduction in Area: 97.2% % Reduction in Volume: 99.1% Epithelialization: Medium (34-66%) Tunneling: No Undermining: No Wound Description Classification: Grade 1 Exudate Amount: Medium Exudate Type: Serosanguineous [name] ([id number]) Exudate Color: red, brown Foul Odor After Cleansing: No Slough/Fibrino No [id number] Page 9 of 10 Wound Bed Granulation Amount: Large (67-100%) Exposed Structure Granulation Quality: Red Fascia Exposed: No Necrotic Amount: None Present (0%) Fat Layer (Subcutaneous Tissue)  Exposed: Yes Tendon Exposed: No Muscle Exposed: No Joint Exposed: No Bone Exposed: No Treatment Notes Wound #1 (Lower Leg) Wound Laterality: Right Cleanser Soap and Water Discharge Instruction: Gently cleanse wound with antibacterial soap, rinse and pat dry prior to dressing wounds Wound Cleanser Discharge Instruction: Wash your hands with soap and water. Remove old dressing, discard into plastic bag and place into trash. Cleanse the wound with Wound Cleanser prior to applying a clean dressing using gauze sponges, not tissues or cotton balls. Do not scrub or use excessive force. Pat dry using gauze sponges, not tissue or cotton balls. Peri-Wound Care Topical Primary Dressing Hydrofera Blue Ready Transfer Foam, 4x5 (in/in) Discharge Instruction: Apply Hydrofera Blue Ready to wound bed as directed Secondary Dressing ABD Pad 5x9 (in/in) Discharge Instruction: Cover with ABD pad Secured With Compression Wrap Urgo K2 Lite, two layer compression system, regular Compression Stockings Add-Ons Electronic Signature(Harrington) Signed: 10/13/2023 3:34:18 PM By: Yevonne Pax RN Entered By: Yevonne Pax on 10/12/2023 09:50:34 -------------------------------------------------------------------------------- Vitals Details Patient Name: Date of Service: [name] 10/12/2023 12:45 PM Medical Record Number: [id number] Patient Account Number: [id number] Date of Birth/Sex: Treating RN: 01-01-1956 (67 y.o. [name] Primary Care Paige Monarrez: Gillermo Murdoch Other Clinician: Referring Renner Sebald: Treating Venecia Mehl/Extender: [name], [name] [name], [name]: 4 Vital Signs Time Taken: 12:45 Temperature (F): 98.4 Height (in): 66 Pulse (bpm): 66 Weight (lbs): 188 Respiratory Rate (breaths/min): 18 Body Mass Index (BMI): 30.3 Blood Pressure (mmHg): 140/71 [name], [name] ([id number]) [id number] Page 10 of 10 Reference Range: 80 - 120 mg /  dl Electronic Signature(Harrington) Signed: 10/13/2023 3:34:18 PM By: Yevonne Pax RN Entered By: Yevonne Pax on 10/12/2023 09:45:56  0.785 N/A N/A A (cm) : rea 0.079 N/A N/A Volume (cm) : 97.20% N/A N/A % Reduction in A rea: 99.10% N/A N/A % Reduction in Volume: Grade 1 N/A N/A Classification: Medium N/A N/A Exudate A mount: Serosanguineous N/A N/A Exudate Type: red, brown N/A N/A Exudate Color: Large (67-100%) N/A N/A Granulation A mount: Red N/A N/A Granulation Quality: None Present (0%) N/A N/A Necrotic A mount: Fat Layer (Subcutaneous Tissue): Yes N/A N/A Exposed Structures: Fascia: No Tendon: No Muscle: No Joint: No Bone: No Medium (34-66%) N/A N/A Epithelialization: Treatment Notes Electronic Signature(Harrington) Signed: 10/13/2023 3:34:18 PM By: Yevonne Pax RN Entered By: Yevonne Pax on 10/12/2023 09:51:50 -------------------------------------------------------------------------------- Multi-Disciplinary Care Plan Details Patient Name: Date of Service: [name], [name] 10/12/2023 12:45 PM Medical Record Number: [id number] Patient Account Number: [id number] Date  of Birth/Sex: Treating RN: [birth year] (67 y.o. [name] Primary Care Demia Viera: Gillermo Murdoch Other Clinician: Referring Kayann Maj: Treating Maelle Sheaffer/Extender: [name], [name] [name], [name]: 4 Active Inactive Abuse / Safety / Falls / Self Care Management Nursing Diagnoses: [name], [name] ([id number]) [id number] Page 6 of 10 Potential for falls Goals: Patient will not experience any injury related to fire Date Initiated: 09/14/2023 Target Resolution Date: 10/15/2023 Goal Status: Active Interventions: Assess fall risk on admission and as needed Assess: immobility, friction, shearing, incontinence upon admission and as needed Assess impairment of mobility on admission and as needed per policy Assess self care needs on admission and as needed Notes: Necrotic Tissue Nursing Diagnoses: Knowledge deficit related to management of necrotic/devitalized tissue Goals: Necrotic/devitalized tissue will be minimized in the wound bed Date Initiated: 09/14/2023 Target Resolution Date: 10/15/2023 Goal Status: Active Interventions: Assess patient pain level pre-, during and post procedure and prior to discharge Treatment Activities: Apply topical anesthetic as ordered : 09/14/2023 Notes: Wound/Skin Impairment Nursing Diagnoses: Knowledge deficit related to ulceration/compromised skin integrity Goals: Patient/caregiver will verbalize understanding of skin care regimen Date Initiated: 09/14/2023 Target Resolution Date: 10/15/2023 Goal Status: Active Ulcer/skin breakdown will have a volume reduction of 30% by week 4 Date Initiated: 09/14/2023 Target Resolution Date: 10/15/2023 Goal Status: Active Ulcer/skin breakdown will have a volume reduction of 50% by week 8 Date Initiated: 09/14/2023 Target Resolution Date: 11/14/2023 Goal Status: Active Ulcer/skin breakdown will have a volume reduction of 80% by week 12 Date Initiated:  09/14/2023 Target Resolution Date: 12/15/2023 Goal Status: Active Ulcer/skin breakdown will heal within 14 weeks Date Initiated: 09/14/2023 Target Resolution Date: 01/15/2024 Goal Status: Active Interventions: Assess patient/caregiver ability to obtain necessary supplies Assess patient/caregiver ability to perform ulcer/skin care regimen upon admission and as needed Assess ulceration(Harrington) every visit Notes: Electronic Signature(Harrington) Signed: 10/13/2023 3:34:18 PM By: Yevonne Pax RN Entered By: Yevonne Pax on 10/12/2023 09:52:07 [name], [name] ([id number]) [id number] Page 7 of 10 -------------------------------------------------------------------------------- Pain Assessment Details Patient Name: Date of Service: [name], [name] 10/12/2023 12:45 PM Medical Record Number: [id number] Patient Account Number: [id number] Date of Birth/Sex: Treating RN: 08-22-1956 (67 y.o. [name] Primary Care Jud Fanguy: Gillermo Murdoch Other Clinician: Referring Masin Shatto: Treating Loney Domingo/Extender: [name], [name] [name], [name]: 4 Active Problems Location of Pain Severity and Description of Pain Patient Has Paino No Site Locations Pain Management and Medication Current Pain Management: Electronic Signature(Harrington) Signed: 10/13/2023 3:34:18 PM By: Yevonne Pax RN Entered By: Yevonne Pax on 10/12/2023 09:46:06 -------------------------------------------------------------------------------- Patient/Caregiver Education Details Patient Name: Date of Service: [name] 10/30/2024andnbsp12:45 PM Medical Record Number: [id number] Patient Account Number: [id number] Date of  [name], [name] ([id number]) [id number] Page 1 of 10 Visit Report for 10/12/2023 Arrival Information Details Patient Name: Date of Service: [name], [name] 10/12/2023 12:45 PM Medical Record Number: [id number] Patient Account Number: [id number] Date of Birth/Sex: Treating RN: 11-21-1956 (67 y.o. [name] Primary Care Harith Mccadden: Gillermo Murdoch Other Clinician: Referring Pheng Prokop: Treating Tinya Cadogan/Extender: [name], [name] [name], [name]: 4 Visit Information History Since Last Visit Added or deleted any medications: No Patient Arrived: Ambulatory Any new allergies or adverse reactions: No Arrival Time: 12:44 Had a fall or experienced change in No Accompanied By: self activities of daily living that may affect Transfer Assistance: None risk of falls: Patient Identification Verified: Yes Signs or symptoms of abuse/neglect since last visito No Secondary Verification Process Completed: Yes Hospitalized since last visit: No Patient Requires Transmission-Based Precautions: No Implantable device outside of the clinic excluding No Patient Has Alerts: No cellular tissue based products placed in the center since last visit: Has Dressing in Place as Prescribed: Yes Has Compression in Place as Prescribed: Yes Pain Present Now: No Electronic Signature(Harrington) Signed: 10/13/2023 3:34:18 PM By: Yevonne Pax RN Entered By: Yevonne Pax on 10/12/2023 09:45:37 -------------------------------------------------------------------------------- Clinic Level of Care Assessment Details Patient Name: Date of Service: [name], [name] 10/12/2023 12:45 PM Medical Record Number: [id number] Patient Account Number: [id number] Date of Birth/Sex: Treating RN: 05/13/1956 (67 y.o. [name] Primary Care Loralye Loberg: Gillermo Murdoch Other Clinician: Referring Ayn Domangue: Treating Aletta Edmunds/Extender: [name], [name] [name], Ahmed Weeks in  Treatment: 4 Clinic Level of Care Assessment Items TOOL 1 Quantity Score []  - 0 Use when EandM and Procedure is performed on INITIAL visit ASSESSMENTS - Nursing Assessment / Reassessment []  - 0 General Physical Exam (combine w/ comprehensive assessment (listed just below) when performed on new pt. evals) []  - 0 Comprehensive Assessment (HX, ROS, Risk Assessments, Wounds Hx, etc.) [name], [name] ([id number]) [id number] Page 2 of 10 ASSESSMENTS - Wound and Skin Assessment / Reassessment []  - 0 Dermatologic / Skin Assessment (not related to wound area) ASSESSMENTS - Ostomy and/or Continence Assessment and Care []  - 0 Incontinence Assessment and Management []  - 0 Ostomy Care Assessment and Management (repouching, etc.) PROCESS - Coordination of Care []  - 0 Simple Patient / Family Education for ongoing care []  - 0 Complex (extensive) Patient / Family Education for ongoing care []  - 0 Staff obtains Chiropractor, Records, T Results / Process Orders est []  - 0 Staff telephones HHA, Nursing Homes / Clarify orders / etc []  - 0 Routine Transfer to another Facility (non-emergent condition) []  - 0 Routine Hospital Admission (non-emergent condition) []  - 0 New Admissions / Manufacturing engineer / Ordering NPWT Apligraf, etc. , []  - 0 Emergency Hospital Admission (emergent condition) PROCESS - Special Needs []  - 0 Pediatric / Minor Patient Management []  - 0 Isolation Patient Management []  - 0 Hearing / Language / Visual special needs []  - 0 Assessment of Community assistance (transportation, D/C planning, etc.) []  - 0 Additional assistance / Altered mentation []  - 0 Support Surface(Harrington) Assessment (bed, cushion, seat, etc.) INTERVENTIONS - Miscellaneous []  - 0 External ear exam []  - 0 Patient Transfer (multiple staff / Nurse, adult / Similar devices) []  - 0 Simple Staple / Suture removal (25 or less) []  - 0 Complex Staple / Suture removal (26 or  more) []  - 0 Hypo/Hyperglycemic Management (do not check if billed separately) []  - 0 Ankle / Brachial Index (ABI) - do not check if billed

## 2023-10-14 ENCOUNTER — Ambulatory Visit
Admission: RE | Admit: 2023-10-14 | Discharge: 2023-10-14 | Disposition: A | Discharge status: Home / Self Care | Payer: 59 | Source: Ambulatory Visit | Attending: Internal Medicine | Admitting: Internal Medicine

## 2023-10-14 DIAGNOSIS — Z1231 Encounter for screening mammogram for malignant neoplasm of breast: Secondary | ICD-10-CM | POA: Diagnosis not present

## 2023-10-17 ENCOUNTER — Encounter: Payer: Self-pay | Admitting: Urology

## 2023-10-17 ENCOUNTER — Ambulatory Visit: Payer: 59 | Admitting: Urology

## 2023-10-17 VITALS — BP 167/76 | HR 73 | Ht 66.0 in | Wt 185.0 lb

## 2023-10-17 DIAGNOSIS — N3946 Mixed incontinence: Secondary | ICD-10-CM

## 2023-10-17 LAB — URINALYSIS, COMPLETE
Bilirubin, UA: NEGATIVE
Ketones, UA: NEGATIVE
Nitrite, UA: NEGATIVE
Protein,UA: NEGATIVE
RBC, UA: NEGATIVE
Specific Gravity, UA: 1.01 (ref 1.005–1.030)
Urobilinogen, Ur: 0.2 mg/dL (ref 0.2–1.0)
pH, UA: 6 (ref 5.0–7.5)

## 2023-10-17 LAB — MICROSCOPIC EXAMINATION

## 2023-10-17 MED ORDER — TRIMETHOPRIM 100 MG PO TABS: 100.0000 mg | ORAL_TABLET | Freq: Every day | ORAL | 11 refills | Status: DC

## 2023-10-17 NOTE — Addendum Note (Signed)
Addended by: Sueanne Margarita on: 10/17/2023 04:10 PM   Modules accepted: Orders

## 2023-10-17 NOTE — Progress Notes (Signed)
10/17/2023 3:49 PM   [name] 10/08/1956 [id number]  Referring provider: Sherron Monday, MD [address] [place],  [place] [address]  No chief complaint on file.   HPI: ST: Patient followed by Dr. Ashley Royalty at [facility] in 2021 for urge incontinence previously on Vesicare and Myrbetriq and residual 156 mL.  Never had urodynamics.  When he saw her in 2023 residual after double voiding was 280 mL.  Had rectal surgery in August and was given Gemtesa.   Today Patient leaks with coughing sneezing but not bending lifting.  She has urge incontinence.  Both are significant.  She has moderately severe bedwetting.  She wears 5 pads a day and the severity of the leakage varies   She voids with a good flow and feels empty.  No hysterectomy.  She has had low back surgery    On pelvic examination patient had a moderate grade 2 cystocele with a mild central defect and more of a trapdoor effect.  She did not have any hinging effect.  The cervix distended from 8 or 9 cm to approximately 6 cm.  Minimal rectocele.  Grade 2 hypermobility the bladder neck and negative cough test.   Bladder scan residual was 220 mL    Patient has mixed incontinence and incomplete bladder emptying without risk factors. Role of urodynamics and cystoscopy discussed. Prolapse likely is affecting some of her efficiency of emptying but in my opinion is likely not the sole factor without a hinging effect or large central defect. We will check her bladder emptying with and without prolapse reduced. It would be hard to justify a hysterectomy and prolapse. That is asymptomatic. A trial of a pessary is a distant option. She has mixed incontinence patient with an elevated residual   Today Frequency stable.  Last culture positive Patient never had the urodynamics.  She does not think the antibiotic helped her.  She says she is not get a lot of bladder infections. The pelvic examination was very similar.  She had mild grade 2  hypermobility the bladder neck and a significant positive cough test after cystoscopy.  She leaked a few drops before cystoscopy.  She had the hide trapdoor grade 2 cystocele and cervix send from 8 or 9 cm to approximately 6 cm. Cystoscopy: Patient underwent flexible cystoscopy.  Urine was cloudy with a lot of white flecks.  Bladder mucosa looked otherwise within normal limits.  No carcinoma.  Visibility was diminished by cloudy urine.    PMH: Past Medical History:  Diagnosis Date   (HFpEF) heart failure with preserved ejection fraction (HCC)    Acute renal failure (HCC) 03/29/2011   Acute respiratory distress 11/2020   Acute respiratory failure (HCC) 04/14/2014   Anxiety    Arthritis    CHF (congestive heart failure) (HCC)    Chronic respiratory failure with hypoxia and hypercapnia (HCC)    Community acquired pneumonia 01/10/2013   COPD (chronic obstructive pulmonary disease) (HCC)    no inhalers--smoker, no oxygen   Depression    Dyspnea    Emphysema lung (HCC)    GERD (gastroesophageal reflux disease)    Hyperlipidemia    Hypertension    Hypothyroid    Peripheral vascular disease (HCC)    Pre-diabetes    Restless leg syndrome    Seizures (HCC)    02/13/2011 -hx of seizure due to "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  no seizure since--pt does take keppra and office note from neurologist dr. Marjory Lies on this chart   Urinary incontinence     Surgical History: Past Surgical History:  Procedure Laterality Date   CARPAL TUNNEL RELEASE     left   COLONOSCOPY WITH PROPOFOL N/A 06/17/2022   Procedure: COLONOSCOPY WITH PROPOFOL;  Surgeon: Regis Bill, MD;  Location: [facility] END[facility]COPY;  Service: Endoscopy;  Laterality: N/A;   JOINT REPLACEMENT  08/26/2011   right total knee arthroplasty, left total knee   KNEE ARTHROPLASTY  10/28/2011   Procedure: COMPUTER ASSISTED TOTAL KNEE  ARTHROPLASTY;  Surgeon: Carlisle Beers Rendall III;  Location: [facility];  Service: Orthopedics;  Laterality: Left;  preop femoral nerve block   KNEE SURGERY     right   laminectomy and diskectomy     L4-5 with fusion   PROCTOSCOPY N/A 06/18/2022   Procedure: RIGID PROCTOSCOPY;  Surgeon: Karie Soda, MD;  Location: [facility];  Service: General;  Laterality: N/A;    Home Medications:  Allergies as of 10/17/2023       Reactions   Nsaids Other (See Comments)   Pt states it messes up her kidneys But takes aspirin & ibuprofen   Thiazide-type Diuretics    Other reaction(s): Unknown        Medication List        Accurate as of October 17, 2023  3:49 PM. If you have any questions, ask your nurse or doctor.          acetaminophen 325 MG tablet Commonly known as: TYLENOL Take 2 tablets (650 mg total) by mouth every 6 (six) hours as needed for mild pain (or Fever >/= 101).   acidophilus Caps capsule Take 1 capsule by mouth daily.   albuterol 108 (90 Base) MCG/ACT inhaler Commonly known as: VENTOLIN HFA Inhale 2 puffs into the lungs every 4 (four) hours as needed for wheezing or shortness of breath.   budesonide-formoterol 160-4.5 MCG/ACT inhaler Commonly known as: SYMBICORT Inhale 2 puffs into the lungs 2 (two) times daily.   busPIRone 30 MG tablet Commonly known as: BUSPAR Take 30 mg by mouth 2 (two) times daily.   cetirizine 10 MG tablet Commonly known as: ZyrTEC Allergy Take 1 tablet (10 mg total) by mouth daily.   cloNIDine 0.2 MG tablet Commonly known as: Catapres Take 1 tablet (0.2 mg total) by mouth 2 (two) times daily.   diltiazem 180 MG 24 hr capsule Commonly known as: CARDIZEM CD Take 180 mg by mouth daily.   escitalopram 20 MG tablet Commonly known as: LEXAPRO Take 1 tablet (20 mg total) by mouth daily.   famotidine 40 MG tablet Commonly known as: PEPCID Take 40 mg by mouth at bedtime.   fenofibrate micronized 134 MG capsule Commonly known as: LOFIBRA Take 1  capsule (134 mg total) by mouth daily before breakfast.   fluticasone 50 MCG/ACT nasal spray Commonly known as: FLONASE Place 1 spray into both nostrils daily.   gabapentin 800 MG tablet Commonly known as: NEURONTIN Take 1 tablet (800 mg total) by mouth 3 (three) times daily.   hydrALAZINE 10 MG tablet Commonly known as: APRESOLINE Take 1 tablet (10 mg total) by mouth 3 (three) times daily as needed. Please take if your blood pressure is above 150/90. You can repeat the dose as needed up with up to 3 pills per day. If your blood pressure is not improving with this, please call your doctor's office.   levothyroxine 25 MCG  tablet Commonly known as: SYNTHROID Take 1 tablet (25 mcg total) by mouth daily before breakfast.   metoprolol tartrate 100 MG tablet Commonly known as: LOPRESSOR Take 1 tablet (100mg ) TWO hours prior to CT scan   minoxidil 2.5 MG tablet Commonly known as: LONITEN Take 2 tablets (5 mg total) by mouth daily.   olmesartan 40 MG tablet Commonly known as: BENICAR TAKE 1 TABLET BY MOUTH EVERY DAY   OXYGEN Inhale 3 L into the lungs at bedtime.   pantoprazole 40 MG tablet Commonly known as: PROTONIX Take 1 tablet (40 mg total) by mouth every morning.   pilocarpine 5 MG tablet Commonly known as: SALAGEN   pramipexole 0.5 MG tablet Commonly known as: MIRAPEX Take 1 tablet (0.5 mg total) by mouth at bedtime.   Spiriva HandiHaler 18 MCG inhalation capsule Generic drug: tiotropium 1 capsule daily.   tiZANidine 4 MG tablet Commonly known as: Zanaflex Take 1 tablet (4 mg total) by mouth every 6 (six) hours as needed for muscle spasms.   traZODone 50 MG tablet Commonly known as: DESYREL Take 0.5-1 tablets (25-50 mg total) by mouth at bedtime as needed for sleep.        Allergies:  Allergies  Allergen Reactions   Nsaids Other (See Comments)    Pt states it messes up her kidneys But takes aspirin & ibuprofen   Thiazide-Type Diuretics     Other  reaction(s): Unknown    Family History: Family History  Problem Relation Age of Onset   Heart attack Father 71   Alzheimer's disease Mother    Breast cancer Neg Hx     Social History:  reports that she has been smoking cigarettes. She has a 100 pack-year smoking history. She has never used smokeless tobacco. She reports current alcohol use. She reports that she does not use drugs.  ROS:                                        Physical Exam: There were no vitals taken for this visit.  Constitutional:  Alert and oriented, No acute distress. HEENT: [place] AT, moist mucus membranes.  Trachea midline, no masses.  Laboratory Data: Lab Results  Component Value Date   WBC 9.2 05/26/2023   HGB 14.4 05/26/2023   HCT 42.3 05/26/2023   MCV 93.6 05/26/2023   PLT 268 05/26/2023    Lab Results  Component Value Date   CREATININE 1.21 (H) 10/11/2023    No results found for: "PSA"  No results found for: "TESTOSTERONE"  Lab Results  Component Value Date   HGBA1C 5.4 10/11/2023    Urinalysis    Component Value Date/Time   COLORURINE YELLOW (A) 07/06/2022 2010   APPEARANCEUR Cloudy (A) 07/11/2023 1013   LABSPEC 1.015 07/06/2022 2010   PHURINE 6.0 07/06/2022 2010   GLUCOSEU Negative 07/11/2023 1013   HGBUR MODERATE (A) 07/06/2022 2010   BILIRUBINUR Negative 07/11/2023 1013   KETONESUR NEGATIVE 07/06/2022 2010   PROTEINUR 1+ (A) 07/11/2023 1013   PROTEINUR 100 (A) 07/06/2022 2010   UROBILINOGEN 0.2 02/22/2023 1425   UROBILINOGEN 0.2 04/14/2014 1217   NITRITE Negative 07/11/2023 1013   NITRITE POSITIVE (A) 07/06/2022 2010   LEUKOCYTESUR 1+ (A) 07/11/2023 1013   LEUKOCYTESUR LARGE (A) 07/06/2022 2010    Pertinent Imaging: Urine reviewed and sent for culture  Assessment & Plan: Patient has complicated incontinence.  She has mixed incontinence  and moderately severe stress incontinence and pelvic examination.  She has elevated residuals with a cystocele  noted.  She has incomplete bladder emptying.  I think is best to put her on trimethoprim suppression therapy 30 tablets and 11 refills and order urodynamics and proceed accordingly.  She would be at very high risk of retention if her stress incontinence is treated especially with a sling.  She also has some medical comorbidities   1. Mixed stress and urge urinary incontinence  - Urinalysis, Complete   No follow-ups on file.  Martina Sinner, MD  [facility] [address] [place], [place] [address] [phone]

## 2023-10-17 NOTE — Addendum Note (Signed)
Addended by: Sueanne Margarita on: 10/17/2023 04:11 PM   Modules accepted: Orders

## 2023-10-17 NOTE — Addendum Note (Signed)
Addended by: Consuella Lose on: 10/17/2023 04:13 PM   Modules accepted: Orders

## 2023-10-18 NOTE — Progress Notes (Signed)
Patient notified

## 2023-10-19 ENCOUNTER — Encounter: Payer: 59 | Attending: Physician Assistant | Admitting: Physician Assistant

## 2023-10-19 DIAGNOSIS — N1832 Chronic kidney disease, stage 3b: Secondary | ICD-10-CM | POA: Insufficient documentation

## 2023-10-19 DIAGNOSIS — I130 Hypertensive heart and chronic kidney disease with heart failure and stage 1 through stage 4 chronic kidney disease, or unspecified chronic kidney disease: Secondary | ICD-10-CM | POA: Diagnosis not present

## 2023-10-19 DIAGNOSIS — J449 Chronic obstructive pulmonary disease, unspecified: Secondary | ICD-10-CM | POA: Diagnosis not present

## 2023-10-19 DIAGNOSIS — I509 Heart failure, unspecified: Secondary | ICD-10-CM | POA: Diagnosis not present

## 2023-10-19 DIAGNOSIS — L97818 Non-pressure chronic ulcer of other part of right lower leg with other specified severity: Secondary | ICD-10-CM | POA: Diagnosis not present

## 2023-10-19 DIAGNOSIS — E11622 Type 2 diabetes mellitus with other skin ulcer: Secondary | ICD-10-CM | POA: Insufficient documentation

## 2023-10-19 DIAGNOSIS — I872 Venous insufficiency (chronic) (peripheral): Secondary | ICD-10-CM | POA: Diagnosis not present

## 2023-10-19 DIAGNOSIS — E1151 Type 2 diabetes mellitus with diabetic peripheral angiopathy without gangrene: Secondary | ICD-10-CM | POA: Diagnosis not present

## 2023-10-19 DIAGNOSIS — E1122 Type 2 diabetes mellitus with diabetic chronic kidney disease: Secondary | ICD-10-CM | POA: Diagnosis not present

## 2023-10-19 DIAGNOSIS — I87301 Chronic venous hypertension (idiopathic) without complications of right lower extremity: Secondary | ICD-10-CM | POA: Diagnosis not present

## 2023-10-19 DIAGNOSIS — F17200 Nicotine dependence, unspecified, uncomplicated: Secondary | ICD-10-CM | POA: Insufficient documentation

## 2023-10-19 NOTE — Progress Notes (Addendum)
Rachel, Harrington ([id number]) [id number] Page 1 of 6 Visit Report for 10/19/2023 Chief Complaint Document Details Patient Name: Date of Service: [name], [name] 10/19/2023 12:45 PM Medical Record Number: [id number] Patient Account Number: [id number] Date of Birth/Sex: Treating RN: [birth year] (67 y.o. [name] Primary Care Provider: Gillermo Murdoch Other Clinician: Referring Provider: Treating Provider/Extender: Azzie Glatter in Treatment: 5 Information Obtained from: Patient Chief Complaint 09/14/2023; patient is here for review of wound on the right anterior lateral mid tibia area. Electronic Signature(s) Signed: 10/19/2023 1:19:46 PM By: Allen Derry PA-C Entered By: Allen Derry on 10/19/2023 10:19:46 -------------------------------------------------------------------------------- HPI Details Patient Name: Date of Service: [name], [name] 10/19/2023 12:45 PM Medical Record Number: [id number] Patient Account Number: [id number] Date of Birth/Sex: Treating RN: 1956-06-24 (67 y.o. [name] Primary Care Provider: Gillermo Murdoch Other Clinician: Referring Provider: Treating Provider/Extender: Azzie Glatter in Treatment: 5 History of Present Illness HPI Description: ADMISSION 09/14/2023 This is a 67 year old woman whose problem began around 9 September she was stepping up onto a curb fell and suffered a skin tear substantially on the right lower leg. She was seen in by primary care given mupirocin and a course of cefadroxil. The cefadroxil was for 14 days. She is still applying the the mupirocin. She does not have a prior wound history in this leg although she probably has some degree of chronic venous insufficiency Past medical history includes type 2 diabetes in the chart although the patient claims she is prediabetic, chronic kidney disease stage IIIb, varicose veins followed previously by vascular vascular  surgery, hypertension, headaches, CHF, COPD, gastroesophageal reflux disease, peripheral vascular disease and degenerative disc disease ABI in the right was 0.81 in our clinic. Patient is a 1-1/2 pack/day smoker 10/9; patient I admitted to the clinic last week suffering a substantial skin tear on the right lower leg. Some of this probably was a hematoma at the time. She has had wounds on her legs before usually traumatic that have healed last week we applied Hydrofera Blue with Urgo K2 light compression. She is tolerated this well and the wound is improved Rachel, Harrington ([id number]) [id number] Page 2 of 6 10/16; patient with a substantial skin tear on the right lower leg in the setting of chronic venous insufficiency with probably an underlying hematoma at some point. We have been using Hydrofera Blue under Urgo light compression. She is tolerating this well and we have substantial improvement 10/22; this wound continues to contract quite nicely. Skin tear on the right lateral lower leg in the setting of chronic venous insufficiency. We have been using Hydrofera Blue under and Urgo light compression. I think this will be healed in 2 to 3 weeks 10/30; continued improvement we have been using Hydrofera Blue under Urgo K2 lite. Measurements are smaller 10-19-2023 upon evaluation today patient appears to be doing well currently in regard to her wound in fact she appears to be completely healed based on what I am seeing. I do. I do not see any signs of active infection at this time which is great news. No fevers, chills, nausea, vomiting, or diarrhea. Electronic Signature(s) Signed: 10/19/2023 1:50:52 PM By: Allen Derry PA-C Entered By: Allen Derry on 10/19/2023 10:50:52 -------------------------------------------------------------------------------- Physical Exam Details Patient Name: Date of Service: [name], [name] 10/19/2023 12:45 PM Medical Record Number:  [id number] Patient Account Number: [id number] Date of Birth/Sex: Treating RN: 1956-07-29 (67 y.o. [name] Primary Care Provider: Gillermo Murdoch Other Clinician: Referring Provider: Treating  Provider/Extender: Jethro Bolus, [name]: 5 Constitutional Well-nourished and well-hydrated in no acute distress. Respiratory normal breathing without difficulty. Psychiatric this patient is able to make decisions and demonstrates good insight into disease process. Alert and Oriented x 3. pleasant and cooperative. Notes Fortunately I do not see any signs of active infection locally or systemically which is great news and in general I do believe that she is really doing quite well currently.Upon inspection patient's wound bed actually showed signs of good granulation and epithelization at this point. There does not appear to be anything open at this point. Electronic Signature(s) Signed: 10/19/2023 1:52:36 PM By: Allen Derry PA-C Entered By: Allen Derry on 10/19/2023 10:52:36 -------------------------------------------------------------------------------- Physician Orders Details Patient Name: Date of Service: [name], [name] 10/19/2023 12:45 PM Medical Record Number: [id number] Patient Account Number: [id number] Date of Birth/Sex: Treating RN: December 19, 1955 (67 y.o. [name] Primary Care Provider: Gillermo Murdoch Other Clinician: [name], Harrington ([id number]) [id number] Page 3 of 6 Referring Provider: Treating Provider/Extender: Jethro Bolus, [name]: 5 The following information was scribed by: Yevonne Pax The information was scribed for: Allen Derry Verbal / Phone Orders: No Diagnosis Coding ICD-10 Coding Code Description L97.818 Non-pressure chronic ulcer of other part of right lower leg with other specified severity S80.811D Abrasion, right lower leg, subsequent encounter I87.321 Chronic venous  hypertension (idiopathic) with inflammation of right lower extremity Discharge From [facility] Services Discharge from Wound Care Center Treatment Complete Wear compression garments daily. Put garments on first thing when you wake up and remove them before bed. Moisturize legs daily after removing compression garments. Electronic Signature(s) Signed: 10/20/2023 11:25:01 AM By: Allen Derry PA-C Signed: 10/26/2023 4:43:41 PM By: Yevonne Pax RN Entered By: Yevonne Pax on 10/19/2023 10:23:34 -------------------------------------------------------------------------------- Problem List Details Patient Name: Date of Service: [name], [name] 10/19/2023 12:45 PM Medical Record Number: [id number] Patient Account Number: [id number] Date of Birth/Sex: Treating RN: Jan 02, 1956 (67 y.o. [name] Primary Care Provider: Gillermo Murdoch Other Clinician: Referring Provider: Treating Provider/Extender: Azzie Glatter in Treatment: 5 Active Problems ICD-10 Encounter Code Description Active Date MDM Diagnosis L97.818 Non-pressure chronic ulcer of other part of right lower leg with other specified 09/14/2023 No Yes severity S80.811D Abrasion, right lower leg, subsequent encounter 09/14/2023 No Yes I87.321 Chronic venous hypertension (idiopathic) with inflammation of right lower 09/14/2023 No Yes extremity Inactive Problems Resolved Problems Electronic Signature(s) Signed: 10/19/2023 1:19:38 PM By: Wilburn Mylar, [place] 10/19/2023 1:19:38 PM By: Allen Derry PA-C SignedKathie Rhodes ([id number]) [id number] Page 4 of 6 Entered By: Allen Derry on 10/19/2023 10:19:38 -------------------------------------------------------------------------------- Progress Note Details Patient Name: Date of Service: [name], [name] 10/19/2023 12:45 PM Medical Record Number: [id number] Patient Account Number: [id number] Date of Birth/Sex: Treating RN: [name] 08, 1956 (67 y.o. [name] Primary Care Provider: Gillermo Murdoch Other Clinician: Referring Provider: Treating Provider/Extender: Azzie Glatter in Treatment: 5 Subjective Chief Complaint Information obtained from Patient 09/14/2023; patient is here for review of wound on the right anterior lateral mid tibia area. History of Present Illness (HPI) ADMISSION 09/14/2023 This is a 67 year old woman whose problem began around 9 September she was stepping up onto a curb fell and suffered a skin tear substantially on the right lower leg. She was seen in by primary care given mupirocin and a course of cefadroxil. The cefadroxil was for 14 days. She is still applying the the mupirocin. She does not have a prior wound history in this leg although she  probably has some degree of chronic venous insufficiency Past medical history includes type 2 diabetes in the chart although the patient claims she is prediabetic, chronic kidney disease stage IIIb, varicose veins followed previously by vascular vascular surgery, hypertension, headaches, CHF, COPD, gastroesophageal reflux disease, peripheral vascular disease and degenerative disc disease ABI in the right was 0.81 in our clinic. Patient is a 1-1/2 pack/day smoker 10/9; patient I admitted to the clinic last week suffering a substantial skin tear on the right lower leg. Some of this probably was a hematoma at the time. She has had wounds on her legs before usually traumatic that have healed last week we applied Hydrofera Blue with Urgo K2 light compression. She is tolerated this well and the wound is improved 10/16; patient with a substantial skin tear on the right lower leg in the setting of chronic venous insufficiency with probably an underlying hematoma at some point. We have been using Hydrofera Blue under Urgo light compression. She is tolerating this well and we have substantial improvement 10/22; this wound continues to contract quite nicely. Skin  tear on the right lateral lower leg in the setting of chronic venous insufficiency. We have been using Hydrofera Blue under and Urgo light compression. I think this will be healed in 2 to 3 weeks 10/30; continued improvement we have been using Hydrofera Blue under Urgo K2 lite. Measurements are smaller 10-19-2023 upon evaluation today patient appears to be doing well currently in regard to her wound in fact she appears to be completely healed based on what I am seeing. I do. I do not see any signs of active infection at this time which is great news. No fevers, chills, nausea, vomiting, or diarrhea. Objective Constitutional Well-nourished and well-hydrated in no acute distress. Vitals Time Taken: 12:56 PM, Height: 66 in, Weight: 188 lbs, BMI: 30.3, Temperature: 99.1 F, Pulse: 97 bpm, Respiratory Rate: 18 breaths/min, Blood Pressure: 138/88 mmHg. Respiratory normal breathing without difficulty. Psychiatric this patient is able to make decisions and demonstrates good insight into disease process. Alert and Oriented x 3. pleasant and cooperative. [name], [name] ([id number]) [id number] Page 5 of 6 General Notes: Fortunately I do not see any signs of active infection locally or systemically which is great news and in general I do believe that she is really doing quite well currently.Upon inspection patient's wound bed actually showed signs of good granulation and epithelization at this point. There does not appear to be anything open at this point. Integumentary (Hair, Skin) Wound #1 status is Healed - Epithelialized. Original cause of wound was Trauma. The date acquired was: 08/30/2023. The wound has been in treatment 5 weeks. The wound is located on the Right Lower Leg. The wound measures 0cm length x 0cm width x 0cm depth; 0cm^2 area and 0cm^3 volume. There is no tunneling or undermining noted. There is a none present amount of drainage noted. There is no granulation  within the wound bed. There is no necrotic tissue within the wound bed. Assessment Active Problems ICD-10 Non-pressure chronic ulcer of other part of right lower leg with other specified severity Abrasion, right lower leg, subsequent encounter Chronic venous hypertension (idiopathic) with inflammation of right lower extremity Plan Discharge From [facility] Services: Discharge from Wound Care Center Treatment Complete Wear compression garments daily. Put garments on first thing when you wake up and remove them before bed. Moisturize legs daily after removing compression garments. 1. And I would recommend that we have the patient going continue to monitor for  any signs of infection or worsening. Based on what I am seeing I do believe that the patient is making really good headway here towards closure which is great news. 2. I am going to recommend as well the patient should continue to use some AandD ointment to the areas she will use Tubigrip until she gets her compression socks that she will switch over to compression socks. We will see patient back for reevaluation in 1 week here in the clinic. If anything worsens or changes patient will contact our office for additional recommendations. Electronic Signature(s) Signed: 10/19/2023 1:53:09 PM By: Allen Derry PA-C Entered By: Allen Derry on 10/19/2023 10:53:09 -------------------------------------------------------------------------------- SuperBill Details Patient Name: Date of Service: [name] 10/19/2023 Medical Record Number: [id number] Patient Account Number: [id number] Date of Birth/Sex: Treating RN: Dec 15, 1955 (67 y.o. [name] Primary Care Provider: Gillermo Murdoch Other Clinician: Referring Provider: Treating Provider/Extender: Azzie Glatter in Treatment: 5 Diagnosis Coding ICD-10 Codes Code Description [phone] Non-pressure chronic ulcer of other part of right lower leg with other specified  severity S80.811D Abrasion, right lower leg, subsequent encounter [name], [name] ([id number]) [id number] Page 6 of 6 I87.321 Chronic venous hypertension (idiopathic) with inflammation of right lower extremity Facility Procedures : CPT4 Code: [id number] Description: [phone] - WOUND CARE VISIT-LEV 2 EST PT Modifier: Quantity: 1 Physician Procedures : CPT4 Code Description Modifier [id number] - WC PHYS LEVEL 3 - EST PT ICD-10 Diagnosis Description L97.818 Non-pressure chronic ulcer of other part of right lower leg with other specified severity S80.811D Abrasion, right lower leg, subsequent  encounter I87.321 Chronic venous hypertension (idiopathic) with inflammation of right lower extremity Quantity: 1 Electronic Signature(s) Signed: 10/19/2023 1:53:31 PM By: Allen Derry PA-C Entered By: Allen Derry on 10/19/2023 10:53:31

## 2023-10-20 DIAGNOSIS — M961 Postlaminectomy syndrome, not elsewhere classified: Secondary | ICD-10-CM | POA: Diagnosis not present

## 2023-10-20 LAB — CULTURE, URINE COMPREHENSIVE

## 2023-10-21 ENCOUNTER — Encounter: Payer: Self-pay | Admitting: Internal Medicine

## 2023-10-21 ENCOUNTER — Ambulatory Visit (INDEPENDENT_AMBULATORY_CARE_PROVIDER_SITE_OTHER): Payer: 59 | Admitting: Internal Medicine

## 2023-10-21 VITALS — BP 160/80 | HR 110 | Ht 66.0 in | Wt 180.8 lb

## 2023-10-21 DIAGNOSIS — E039 Hypothyroidism, unspecified: Secondary | ICD-10-CM | POA: Diagnosis not present

## 2023-10-21 DIAGNOSIS — N1832 Chronic kidney disease, stage 3b: Secondary | ICD-10-CM

## 2023-10-21 DIAGNOSIS — R7303 Prediabetes: Secondary | ICD-10-CM | POA: Insufficient documentation

## 2023-10-21 DIAGNOSIS — E782 Mixed hyperlipidemia: Secondary | ICD-10-CM | POA: Diagnosis not present

## 2023-10-21 DIAGNOSIS — I10 Essential (primary) hypertension: Secondary | ICD-10-CM | POA: Diagnosis not present

## 2023-10-21 MED ORDER — CLONIDINE 0.2 MG/24HR TD PTWK: 0.4000 mg | MEDICATED_PATCH | TRANSDERMAL | 2 refills | Status: DC

## 2023-10-21 MED ORDER — MINOXIDIL 2.5 MG PO TABS: 7.5000 mg | ORAL_TABLET | Freq: Every day | ORAL | 2 refills | Status: DC

## 2023-10-21 NOTE — Progress Notes (Signed)
Established Patient Office Visit  Subjective:  Patient ID: [name], female    DOB: 05/09/1956  Age: 67 y.o. MRN: [id number]  Chief Complaint  Patient presents with   Follow-up    3 month follow up    No new complaints, here for lab review and medication refills.   Labs reviewed and notable for normal A1c, well controlled lipids, normal tsh and stable ckd. No other concerns at this time.   Past Medical History:  Diagnosis Date   (HFpEF) heart failure with preserved ejection fraction (HCC)    Acute renal failure (HCC) 03/29/2011   Acute respiratory distress 11/2020   Acute respiratory failure (HCC) 04/14/2014   Anxiety    Arthritis    CHF (congestive heart failure) (HCC)    Chronic respiratory failure with hypoxia and hypercapnia (HCC)    Community acquired pneumonia 01/10/2013   COPD (chronic obstructive pulmonary disease) (HCC)    no inhalers--smoker, no oxygen   Depression    Dyspnea    Emphysema lung (HCC)    GERD (gastroesophageal reflux disease)    Hyperlipidemia    Hypertension    Hypothyroid    Peripheral vascular disease (HCC)    Pre-diabetes    Restless leg syndrome    Seizures (HCC)    02/13/2011 -hx of seizure due to "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  no seizure since--pt does take keppra and office note from neurologist dr. Marjory Lies on this chart   Urinary incontinence     Past Surgical History:  Procedure Laterality Date   CARPAL TUNNEL RELEASE     left   COLONOSCOPY WITH PROPOFOL N/A 06/17/2022   Procedure: COLONOSCOPY WITH PROPOFOL;  Surgeon: Regis Bill, MD;  Location: [facility];  Service: Endoscopy;  Laterality: N/A;   JOINT REPLACEMENT  08/26/2011   right total knee arthroplasty, left total knee   KNEE ARTHROPLASTY  10/28/2011   Procedure: COMPUTER ASSISTED TOTAL KNEE ARTHROPLASTY;  Surgeon: Carlisle Beers Rendall III;  Location:  [facility];  Service: Orthopedics;  Laterality: Left;  preop femoral nerve block   KNEE SURGERY     right   laminectomy and diskectomy     L4-5 with fusion   PROCTOSCOPY N/A 06/18/2022   Procedure: RIGID PROCTOSCOPY;  Surgeon: Karie Soda, MD;  Location: [facility];  Service: General;  Laterality: N/A;    Social History   Socioeconomic History   Marital status: Widowed    Spouse name: Not on file   Number of children: Not on file   Years of education: Not on file   Highest education level: Not on file  Occupational History   Not on file  Tobacco Use   Smoking status: Every Day    Current packs/day: 2.00    Average packs/day: 2.0 packs/day for 50.0 years (100.0 ttl pk-yrs)    Types: Cigarettes    Passive exposure: Current   Smokeless tobacco: Never  Vaping Use   Vaping status: Every Day   Substances: Nicotine  Substance and Sexual Activity   Alcohol use: Yes    Comment: occasional   Drug use: No   Sexual activity: Yes    Birth control/protection: Post-menopausal  Other Topics Concern   Not on file  Social History Narrative   ** Merged History Encounter **       Social Determinants of Health   Financial Resource Strain: Low Risk  (  08/10/2023)   Received from [facility]   Overall Financial Resource Strain (CARDIA)    Difficulty of Paying Living Expenses: Not hard at all  Food Insecurity: No Food Insecurity (08/10/2023)   Received from [facility]   Hunger Vital Sign    Worried About Running Out of Food in the Last Year: Never true    Ran Out of Food in the Last Year: Never true  Transportation Needs: No Transportation Needs (08/10/2023)   Received from [facility]    In the past 12 months, has lack of transportation kept you from medical appointments or from getting medications?: No    Lack of Transportation (Non-Medical): No  Physical Activity: Not on file  Stress: Not on file  Social  Connections: Not on file  Intimate Partner Violence: Not At Risk (04/27/2023)   Humiliation, Afraid, Rape, and Kick questionnaire    Fear of Current or Ex-Partner: No    Emotionally Abused: No    Physically Abused: No    Sexually Abused: No    Family History  Problem Relation Age of Onset   Heart attack Father 58   Alzheimer'Jacyln Carmer disease Mother    Breast cancer Neg Hx     Allergies  Allergen Reactions   Nsaids Other (See Comments)    Pt states it messes up her kidneys But takes aspirin & ibuprofen   Thiazide-Type Diuretics     Other reaction(Asharia Lotter): Unknown    Review of Systems  Constitutional: Negative.   HENT: Negative.    Eyes: Negative.   Respiratory:  Positive for shortness of breath (on exertion).   Cardiovascular: Negative.   Gastrointestinal:  Positive for constipation.  Genitourinary: Negative.   Skin: Negative.   Neurological:  Positive for headaches (chronic).  Endo/Heme/Allergies: Negative.        Objective:   BP (!) 160/80   Pulse (!) 110   Ht 5\' 6"  (1.676 m)   Wt 180 lb 12.8 oz (82 kg)   SpO2 93%   BMI 29.18 kg/m   Vitals:   10/21/23 1510  BP: (!) 160/80  Pulse: (!) 110  Height: 5\' 6"  (1.676 m)  Weight: 180 lb 12.8 oz (82 kg)  SpO2: 93%  BMI (Calculated): 29.2    Physical Exam Vitals reviewed.  Constitutional:      General: She is not in acute distress. HENT:     Head: Normocephalic.     Nose: Nose normal.     Mouth/Throat:     Mouth: Mucous membranes are moist.  Eyes:     Extraocular Movements: Extraocular movements intact.     Pupils: Pupils are equal, round, and reactive to light.  Cardiovascular:     Rate and Rhythm: Normal rate and regular rhythm.     Heart sounds: No murmur heard. Pulmonary:     Effort: Pulmonary effort is normal.     Breath sounds: Decreased air movement present. No rhonchi or rales.  Abdominal:     General: Abdomen is flat.     Palpations: There is no hepatomegaly, splenomegaly or mass.     Tenderness:  There is abdominal tenderness in the epigastric area and suprapubic area. There is no guarding or rebound.  Musculoskeletal:        General: Normal range of motion.     Cervical back: Normal range of motion. No tenderness.     Comments: Boot in place  Skin:    General: Skin is  warm and dry.     Comments: Onychomycosis of right big toenail. Right leg wound dressed.  Neurological:     General: No focal deficit present.     Mental Status: She is alert and oriented to person, place, and time.     Cranial Nerves: No cranial nerve deficit.     Motor: No weakness.  Psychiatric:        Mood and Affect: Mood normal.        Behavior: Behavior normal.      No results found for any visits on 10/21/23.  Recent Results (from the past 2160 hour(Jaquisha Frech))  POCT CBG (Fasting - Glucose)     Status: Abnormal   Collection Time: 10/10/23  1:18 PM  Result Value Ref Range   Glucose Fasting, POC 105 (A) 70 - 99 mg/dL  TSH     Status: None   Collection Time: 10/11/23 10:52 AM  Result Value Ref Range   TSH 1.530 0.450 - 4.500 uIU/mL  Hemoglobin A1c     Status: None   Collection Time: 10/11/23 10:52 AM  Result Value Ref Range   Hgb A1c MFr Bld 5.4 4.8 - 5.6 %    Comment:          Prediabetes: 5.7 - 6.4          Diabetes: >6.4          Glycemic control for adults with diabetes: <7.0    Est. average glucose Bld gHb Est-mCnc 108 mg/dL  Comprehensive metabolic panel     Status: Abnormal   Collection Time: 10/11/23 10:52 AM  Result Value Ref Range   Glucose 95 70 - 99 mg/dL   BUN 9 8 - 27 mg/dL   Creatinine, Ser 0.10 (H) 0.57 - 1.00 mg/dL   eGFR 49 (L) >27 OZ/DGU/4.40   BUN/Creatinine Ratio 7 (L) 12 - 28   Sodium 134 134 - 144 mmol/L   Potassium 4.8 3.5 - 5.2 mmol/L   Chloride 96 96 - 106 mmol/L   CO2 24 20 - 29 mmol/L   Calcium 10.0 8.7 - 10.3 mg/dL   Total Protein 6.6 6.0 - 8.5 g/dL   Albumin 4.1 3.9 - 4.9 g/dL   Globulin, Total 2.5 1.5 - 4.5 g/dL   Bilirubin Total 0.4 0.0 - 1.2 mg/dL   Alkaline  Phosphatase 24 (L) 44 - 121 IU/L   AST 24 0 - 40 IU/L   ALT 19 0 - 32 IU/L  Lipid panel     Status: None   Collection Time: 10/11/23 10:52 AM  Result Value Ref Range   Cholesterol, Total 149 100 - 199 mg/dL   Triglycerides 347 0 - 149 mg/dL   HDL 61 >42 mg/dL   VLDL Cholesterol Cal 19 5 - 40 mg/dL   LDL Chol Calc (NIH) 69 0 - 99 mg/dL   Chol/HDL Ratio 2.4 0.0 - 4.4 ratio    Comment:                                   T. Chol/HDL Ratio                                             Men  Women  1/2 Avg.Risk  3.4    3.3                                   Avg.Risk  5.0    4.4                                2X Avg.Risk  9.6    7.1                                3X Avg.Risk 23.4   11.0   Urinalysis, Complete     Status: Abnormal   Collection Time: 10/17/23  2:40 PM  Result Value Ref Range   Specific Gravity, UA 1.010 1.005 - 1.030   pH, UA 6.0 5.0 - 7.5   Color, UA Yellow Yellow   Appearance Ur Cloudy (A) Clear   Leukocytes,UA 1+ (A) Negative   Protein,UA Negative Negative/Trace   Glucose, UA 2+ (A) Negative   Ketones, UA Negative Negative   RBC, UA Negative Negative   Bilirubin, UA Negative Negative   Urobilinogen, Ur 0.2 0.2 - 1.0 mg/dL   Nitrite, UA Negative Negative   Microscopic Examination See below:   Microscopic Examination     Status: Abnormal   Collection Time: 10/17/23  2:40 PM   Urine  Result Value Ref Range   WBC, UA 11-30 (A) 0 - 5 /hpf   RBC, Urine 0-2 0 - 2 /hpf   Epithelial Cells (non renal) 0-10 0 - 10 /hpf   Bacteria, UA Many (A) None seen/Few  CULTURE, URINE COMPREHENSIVE     Status: None   Collection Time: 10/17/23  4:16 PM   Specimen: Urine   UR  Result Value Ref Range   Urine Culture, Comprehensive Final report    Organism ID, Bacteria Comment     Comment: Mixed urogenital flora 50,000-100,000 colony forming units per mL    Organism ID, Bacteria Not applicable       Assessment & Plan:  As per problem list  Problem  List Items Addressed This Visit       Cardiovascular and Mediastinum   Hypertension - Primary   Relevant Medications   cloNIDine (CATAPRES-TTS-2) 0.2 mg/24hr patch   minoxidil (LONITEN) 2.5 MG tablet     Endocrine   Hypothyroidism     Genitourinary   Stage 3b chronic kidney disease (HCC)     Other   Prediabetes   Mixed hyperlipidemia   Relevant Medications   cloNIDine (CATAPRES-TTS-2) 0.2 mg/24hr patch   minoxidil (LONITEN) 2.5 MG tablet    Return in about 3 weeks (around 11/11/2023) for BP followup.   Total time spent: 20 minutes  Luna Fuse, MD  10/21/2023   This document may have been prepared by [facility] Voice Recognition software and as such may include unintentional dictation errors.

## 2023-10-26 NOTE — Progress Notes (Signed)
[name], [name] ([id number]) [id number] Page 1 of 8 Visit Report for 10/19/2023 Arrival Information Details Patient Name: Date of Service: [name], [name] 10/19/2023 12:45 PM Medical Record Number: [id number] Patient Account Number: [id number] Date of Birth/Sex: Treating RN: [birth year] (67 y.o. [name] Primary Care Delance Weide: Gillermo Murdoch Other Clinician: Referring Davonne Jarnigan: Treating Matilyn Fehrman/Extender: Azzie Glatter in Treatment: 5 Visit Information History Since Last Visit Added or deleted any medications: No Patient Arrived: Ambulatory Any new allergies or adverse reactions: No Arrival Time: 12:53 Had a fall or experienced change in No Accompanied By: self activities of daily living that may affect Transfer Assistance: None risk of falls: Patient Identification Verified: Yes Signs or symptoms of abuse/neglect since last visito No Secondary Verification Process Completed: Yes Hospitalized since last visit: No Patient Requires Transmission-Based Precautions: No Implantable device outside of the clinic excluding No Patient Has Alerts: No cellular tissue based products placed in the center since last visit: Has Dressing in Place as Prescribed: Yes Has Compression in Place as Prescribed: Yes Pain Present Now: No Electronic Signature(Harrington) Signed: 10/26/2023 4:43:41 PM By: Yevonne Pax RN Entered By: Yevonne Pax on 10/19/2023 12:58:02 -------------------------------------------------------------------------------- Clinic Level of Care Assessment Details Patient Name: Date of Service: [name] 10/19/2023 12:45 PM Medical Record Number: [id number] Patient Account Number: [id number] Date of Birth/Sex: Treating RN: 1956-05-22 (67 y.o. [name] Primary Care Breann Losano: Gillermo Murdoch Other Clinician: Referring Brixton Schnapp: Treating Malikhi Ogan/Extender: Jethro Bolus, [name]: 5 Clinic Level of  Care Assessment Items TOOL 4 Quantity Score X- 1 0 Use when only an EandM is performed on FOLLOW-UP visit ASSESSMENTS - Nursing Assessment / Reassessment X- 1 10 Reassessment of Co-morbidities (includes updates in patient status) X- 1 5 Reassessment of Adherence to Treatment Plan [name] ([id number]) [id number] Page 2 of 8 ASSESSMENTS - Wound and Skin A ssessment / Reassessment X - Simple Wound Assessment / Reassessment - one wound 1 5 []  - 0 Complex Wound Assessment / Reassessment - multiple wounds []  - 0 Dermatologic / Skin Assessment (not related to wound area) ASSESSMENTS - Focused Assessment []  - 0 Circumferential Edema Measurements - multi extremities []  - 0 Nutritional Assessment / Counseling / Intervention []  - 0 Lower Extremity Assessment (monofilament, tuning fork, pulses) []  - 0 Peripheral Arterial Disease Assessment (using hand held doppler) ASSESSMENTS - Ostomy and/or Continence Assessment and Care []  - 0 Incontinence Assessment and Management []  - 0 Ostomy Care Assessment and Management (repouching, etc.) PROCESS - Coordination of Care X - Simple Patient / Family Education for ongoing care 1 15 []  - 0 Complex (extensive) Patient / Family Education for ongoing care []  - 0 Staff obtains Chiropractor, Records, T Results / Process Orders est []  - 0 Staff telephones HHA, Nursing Homes / Clarify orders / etc []  - 0 Routine Transfer to another Facility (non-emergent condition) []  - 0 Routine Hospital Admission (non-emergent condition) []  - 0 New Admissions / Manufacturing engineer / Ordering NPWT Apligraf, etc. , []  - 0 Emergency Hospital Admission (emergent condition) X- 1 10 Simple Discharge Coordination []  - 0 Complex (extensive) Discharge Coordination PROCESS - Special Needs []  - 0 Pediatric / Minor Patient Management []  - 0 Isolation Patient Management []  - 0 Hearing / Language / Visual special needs []  -  0 Assessment of Community assistance (transportation, D/C planning, etc.) []  - 0 Additional assistance / Altered mentation []  - 0 Support Surface(Harrington) Assessment (bed, cushion, seat, etc.) INTERVENTIONS - Wound Cleansing / Measurement []  -  0 Simple Wound Cleansing - one wound []  - 0 Complex Wound Cleansing - multiple wounds []  - 0 Wound Imaging (photographs - any number of wounds) []  - 0 Wound Tracing (instead of photographs) []  - 0 Simple Wound Measurement - one wound []  - 0 Complex Wound Measurement - multiple wounds INTERVENTIONS - Wound Dressings []  - 0 Small Wound Dressing one or multiple wounds []  - 0 Medium Wound Dressing one or multiple wounds []  - 0 Large Wound Dressing one or multiple wounds []  - 0 Application of Medications - topical []  - 0 Application of Medications - injection INTERVENTIONS - Miscellaneous []  - 0 External ear exam [name], [name] ([id number]) [id number] Page 3 of 8 []  - 0 Specimen Collection (cultures, biopsies, blood, body fluids, etc.) []  - 0 Specimen(Harrington) / Culture(Harrington) sent or taken to Lab for analysis []  - 0 Patient Transfer (multiple staff / Michiel Sites Lift / Similar devices) []  - 0 Simple Staple / Suture removal (25 or less) []  - 0 Complex Staple / Suture removal (26 or more) []  - 0 Hypo / Hyperglycemic Management (close monitor of Blood Glucose) []  - 0 Ankle / Brachial Index (ABI) - do not check if billed separately X- 1 5 Vital Signs Has the patient been seen at the hospital within the last three years: Yes Total Score: 50 Level Of Care: New/Established - Level 2 Electronic Signature(Harrington) Signed: 10/26/2023 4:43:41 PM By: Yevonne Pax RN Entered By: Yevonne Pax on 10/19/2023 13:24:00 -------------------------------------------------------------------------------- Encounter Discharge Information Details Patient Name: Date of Service: [name] 10/19/2023 12:45 PM Medical Record Number:  [id number] Patient Account Number: [id number] Date of Birth/Sex: Treating RN: June 19, 1956 (67 y.o. [name] Primary Care Cherrell Maybee: Gillermo Murdoch Other Clinician: Referring Sanjuana Mruk: Treating Eugune Sine/Extender: Azzie Glatter in Treatment: 5 Encounter Discharge Information Items Discharge Condition: Stable Ambulatory Status: Ambulatory Discharge Destination: Home Transportation: Private Auto Accompanied By: self Schedule Follow-up Appointment: Yes Clinical Summary of Care: Electronic Signature(Harrington) Signed: 10/19/2023 1:31:06 PM By: Yevonne Pax RN Entered By: Yevonne Pax on 10/19/2023 13:31:06 Lower Extremity Assessment Details -------------------------------------------------------------------------------- [name] ([id number]) [id number] Page 4 of 8 Patient Name: Date of Service: [name], [name] 10/19/2023 12:45 PM Medical Record Number: [id number] Patient Account Number: [id number] Date of Birth/Sex: Treating RN: 19-Feb-1956 (67 y.o. [name] Primary Care Shonda Mandarino: Gillermo Murdoch Other Clinician: Referring Kalya Troeger: Treating Nimah Uphoff/Extender: Jethro Bolus, [name]: 5 Edema Assessment Left: Right: Assessed: No No Edema: No Calf Left: Right: Point of Measurement: 34 cm From Medial Instep 30 cm Ankle Left: Right: Point of Measurement: 10 cm From Medial Instep 22 cm Knee To Floor Left: Right: From Medial Instep 46 cm Vascular Assessment Left: Right: Extremity colors, hair growth, and conditions: Extremity Color: Hyperpigmented Hair Growth on Extremity: No Temperature of Extremity: Warm Capillary Refill: < 3 seconds Dependent Rubor: No Blanched when Elevated: No Lipodermatosclerosis: No Toe Nail Assessment Left: Right: Thick: Yes Discolored: Yes Deformed: Yes Improper Length and Hygiene: Yes Electronic Signature(Harrington) Signed: 10/26/2023 4:43:41 PM By: Yevonne Pax  RN Entered By: Yevonne Pax on 10/19/2023 13:23:05 -------------------------------------------------------------------------------- Multi Wound Chart Details Patient Name: Date of Service: [name] 10/19/2023 12:45 PM Medical Record Number: [id number] Patient Account Number: [id number] Date of Birth/Sex: Treating RN: 12-17-1955 (67 y.o. 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 Chronic Obstructive Pulmonary Comorbid History: Disease (COPD), Congestive Heart Failure, Hypertension, Peripheral Venous  Disease, Type II Diabetes, Seizure Disorder 08/30/2023 Date Acquired: 5 Weeks of Treatment: Healed - Epithelialized Wound Status: No Wound Recurrence: 0x0x0 Measurements L x W x D (cm) 0 A (cm) : rea 0 Volume (cm) : 100.00% % Reduction in A rea: 100.00% %  Reduction in Volume: Grade 1 Classification: None Present Exudate A mount: None Present (0%) Granulation A mount: None Present (0%) Necrotic A mount: Fascia: No Exposed Structures: Fat Layer (Subcutaneous Tissue): No Tendon: No Muscle: No Joint: No  Bone: No Large (67-100%) Epithelialization:] [N/A:N/A N/A N/A N/A N/A N/A N/A N/A N/A N/A N/A N/A N/A N/A N/A N/A N/A N/A N/A N/A] Treatment Notes Electronic Signature(Harrington) Signed: 10/26/2023 4:43:41 PM By: Yevonne Pax RN Entered By: Yevonne Pax on 10/19/2023 13:23:09 -------------------------------------------------------------------------------- Multi-Disciplinary Care Plan  Details Patient Name: Date of Service: [name] 10/19/2023 12:45 PM Medical Record Number: [id number] Patient Account Number: [id number] Date of Birth/Sex: Treating RN: 23-Nov-1956 (67 y.o. [name] Primary Care Chrishawn Kring: Gillermo Murdoch Other Clinician: Referring Damita Eppard: Treating Talulah Schirmer/Extender: Jethro Bolus, [name]: 5 Active Inactive Electronic Signature(Harrington) Signed: 10/26/2023 4:43:41 PM By: Yevonne Pax RN Entered By: Yevonne Pax on 10/19/2023 13:24:23 [name] ([id number]) [id number] Page 6 of 8 -------------------------------------------------------------------------------- Pain Assessment Details Patient Name: Date of Service: [name], [name] 10/19/2023 12:45 PM Medical Record Number: [id number] Patient Account Number: [id number] Date of Birth/Sex: Treating RN: 07-09-1956 (67 y.o. [name] Primary Care Willamae Demby: Gillermo Murdoch Other Clinician: Referring Milli Woolridge: Treating Jeniah Kishi/Extender: Jethro Bolus, [name]: 5 Active Problems Location of Pain Severity and Description of Pain Patient Has Paino No Site Locations Pain Management and Medication Current Pain Management: Electronic Signature(Harrington) Signed: 10/26/2023 4:43:41 PM By: Yevonne Pax RN Entered By: Yevonne Pax on 10/19/2023 12:58:30 -------------------------------------------------------------------------------- Patient/Caregiver Education Details Patient Name: Date of Service: [name] 11/6/2024andnbsp12:45 PM Medical Record Number: [id number] Patient Account Number: [id number] Date of Birth/Gender: Treating RN: [birth year] (67 y.o. [name] Primary Care Physician: Gillermo Murdoch Other Clinician: Referring Physician: Treating Physician/Extender: Jethro Bolus, [name]: 5 [name], [name] ([id number]) [id number] Page 7 of 8 Education  Assessment Education Provided To: Patient Education Topics Provided Wound/Skin Impairment: Handouts: Other: discharge instructions Methods: Explain/Verbal Responses: State content correctly Electronic Signature(Harrington) Signed: 10/26/2023 4:43:41 PM By: Yevonne Pax RN Entered By: Yevonne Pax on 10/19/2023 13:30:05 -------------------------------------------------------------------------------- Wound Assessment Details Patient Name: Date of Service: [name], [name] 10/19/2023 12:45 PM Medical Record Number: [id number] Patient Account Number: [id number] Date of Birth/Sex: Treating RN: July 08, 1956 (67 y.o. [name] Primary Care Mckenzee Beem: Gillermo Murdoch Other Clinician: Referring Gwenetta Devos: Treating Vonetta Foulk/Extender: Jethro Bolus, [name]: 5 Wound Status Wound Number: 1 Primary Diabetic Wound/Ulcer of the Lower Extremity Etiology: Wound Location: Right Lower Leg Wound Healed - Epithelialized Wounding Event: Trauma Status: Date Acquired: 08/30/2023 Comorbid Chronic Obstructive Pulmonary Disease (COPD), Congestive Heart Weeks Of Treatment: 5 History: Failure, Hypertension, Peripheral Venous Disease, Type II Diabetes, Clustered Wound: No Seizure Disorder Wound Measurements Length: (cm) Width: (cm) Depth: (cm) Area: (cm) Volume: (cm) 0 % Reduction in Area: 100% 0 % Reduction in Volume: 100% 0 Epithelialization: Large (67-100%) 0 Tunneling: No 0 Undermining: No Wound Description Classification: Grade 1 Exudate  Amount: None Present Foul Odor After Cleansing: No Slough/Fibrino No Wound Bed Granulation Amount: None Present (0%) Exposed Structure Necrotic Amount: None Present (0%) Fascia Exposed: No Fat Layer (Subcutaneous Tissue) Exposed: No Tendon Exposed: No Muscle Exposed: No Joint Exposed: No Bone Exposed: No Treatment Notes Wound #1 (Lower Leg) Wound Laterality: Right [name], [name] ([id number]) [id number]  Page 8 of 8 Cleanser Peri-Wound Care Topical Primary Dressing Secondary Dressing Secured With Compression Wrap Compression Stockings Add-Ons Electronic Signature(Harrington) Signed: 10/26/2023 4:43:41 PM By: Yevonne Pax RN Entered By: Yevonne Pax on 10/19/2023 13:22:22 -------------------------------------------------------------------------------- Vitals Details Patient Name: Date of Service: [name]. 10/19/2023 12:45 PM Medical Record Number: [id number] Patient Account Number: [id number] Date of Birth/Sex: Treating RN: 12/29/1955 (67 y.o. [name] Primary Care Avian Greenawalt: Gillermo Murdoch Other Clinician: Referring Jhonatan Lomeli: Treating Shyloh Krinke/Extender: Jethro Bolus, [name]: 5 Vital Signs Time Taken: 12:56 Temperature (F): 99.1 Height (in): 66 Pulse (bpm): 97 Weight (lbs): 188 Respiratory Rate (breaths/min): 18 Body Mass Index (BMI): 30.3 Blood Pressure (mmHg): 138/88 Reference Range: 80 - 120 mg / dl Electronic Signature(Harrington) Signed: 10/26/2023 4:43:41 PM By: Yevonne Pax RN Entered By: Yevonne Pax on 10/19/2023 12:58:23

## 2023-10-27 ENCOUNTER — Other Ambulatory Visit: Payer: Self-pay | Admitting: Internal Medicine

## 2023-10-27 DIAGNOSIS — K219 Gastro-esophageal reflux disease without esophagitis: Secondary | ICD-10-CM

## 2023-10-28 MED ORDER — BUSPIRONE HCL 30 MG PO TABS: 30.0000 mg | ORAL_TABLET | Freq: Two times a day (BID) | ORAL | 2 refills | Status: DC

## 2023-10-28 MED ORDER — PANTOPRAZOLE SODIUM 40 MG PO TBEC: 40.0000 mg | DELAYED_RELEASE_TABLET | Freq: Every morning | ORAL | 2 refills | Status: DC

## 2023-10-31 ENCOUNTER — Telehealth: Payer: Self-pay | Admitting: Urology

## 2023-10-31 NOTE — Telephone Encounter (Signed)
Patient called regarding referral to [facility]. She said that Dr. Sherron Monday was referring her there. She said she called [facility], and they don't have a referral for her. There is a referral from July to [facility] with [facility]. She wants to be seen in [place]. She said this was discussed at her appt on 10/17/23. Please advise patient.

## 2023-10-31 NOTE — Telephone Encounter (Signed)
Spoke with patient and advised that [facility] does not accept her insurance and she will be going to [facility] for UDS

## 2023-11-01 ENCOUNTER — Telehealth: Payer: Self-pay | Admitting: Internal Medicine

## 2023-11-01 DIAGNOSIS — K219 Gastro-esophageal reflux disease without esophagitis: Secondary | ICD-10-CM | POA: Diagnosis not present

## 2023-11-01 DIAGNOSIS — R0789 Other chest pain: Secondary | ICD-10-CM | POA: Diagnosis not present

## 2023-11-01 DIAGNOSIS — F17209 Nicotine dependence, unspecified, with unspecified nicotine-induced disorders: Secondary | ICD-10-CM | POA: Diagnosis not present

## 2023-11-01 DIAGNOSIS — R000 Tachycardia, unspecified: Secondary | ICD-10-CM | POA: Diagnosis not present

## 2023-11-01 DIAGNOSIS — J439 Emphysema, unspecified: Secondary | ICD-10-CM | POA: Diagnosis not present

## 2023-11-01 DIAGNOSIS — R0602 Shortness of breath: Secondary | ICD-10-CM | POA: Diagnosis not present

## 2023-11-01 DIAGNOSIS — I5032 Chronic diastolic (congestive) heart failure: Secondary | ICD-10-CM | POA: Diagnosis not present

## 2023-11-01 DIAGNOSIS — N1832 Chronic kidney disease, stage 3b: Secondary | ICD-10-CM | POA: Diagnosis not present

## 2023-11-01 DIAGNOSIS — I10 Essential (primary) hypertension: Secondary | ICD-10-CM

## 2023-11-01 DIAGNOSIS — G894 Chronic pain syndrome: Secondary | ICD-10-CM | POA: Diagnosis not present

## 2023-11-01 MED ORDER — CLONIDINE HCL 0.2 MG PO TABS: 0.2000 mg | ORAL_TABLET | Freq: Two times a day (BID) | ORAL | 0 refills | Status: DC

## 2023-11-01 NOTE — Telephone Encounter (Signed)
Patient called in stating that [name] switched her from clonidine pills to patches and the patches are causing her to itch and burn. She needs to know how many of the pills she needs to take so she can switch back to pills. She has clonidine 0.2 mg pills BID. Also needs a refill on the pills.  She is also requesting something be sent in for hot flashes.

## 2023-11-02 NOTE — Telephone Encounter (Signed)
Patient called and stated that she called [facility] and they have no record of a referral for her. Please advise patient.

## 2023-11-09 ENCOUNTER — Other Ambulatory Visit: Payer: Self-pay

## 2023-11-09 MED ORDER — PRAMIPEXOLE DIHYDROCHLORIDE 0.5 MG PO TABS: 0.5000 mg | ORAL_TABLET | Freq: Every day | ORAL | 2 refills | Status: DC

## 2023-11-14 ENCOUNTER — Ambulatory Visit (INDEPENDENT_AMBULATORY_CARE_PROVIDER_SITE_OTHER): Payer: 59 | Admitting: Internal Medicine

## 2023-11-14 ENCOUNTER — Encounter: Payer: Self-pay | Admitting: Internal Medicine

## 2023-11-14 VITALS — BP 130/78 | HR 73 | Ht 66.0 in | Wt 181.0 lb

## 2023-11-14 DIAGNOSIS — E782 Mixed hyperlipidemia: Secondary | ICD-10-CM

## 2023-11-14 DIAGNOSIS — E039 Hypothyroidism, unspecified: Secondary | ICD-10-CM | POA: Diagnosis not present

## 2023-11-14 DIAGNOSIS — I10 Essential (primary) hypertension: Secondary | ICD-10-CM | POA: Diagnosis not present

## 2023-11-14 NOTE — Progress Notes (Signed)
Established Patient Office Visit  Subjective:  Patient ID: [name], female    DOB: 05-10-1956  Age: 67 y.o. MRN: [id number]  No chief complaint on file.   No new complaints, here for BP follow up and medication refills. BP well controlled on current regimen.   No other concerns at this time.   Past Medical History:  Diagnosis Date   (HFpEF) heart failure with preserved ejection fraction (HCC)    Acute renal failure (HCC) 03/29/2011   Acute respiratory distress 11/2020   Acute respiratory failure (HCC) 04/14/2014   Anxiety    Arthritis    CHF (congestive heart failure) (HCC)    Chronic respiratory failure with hypoxia and hypercapnia (HCC)    Community acquired pneumonia 01/10/2013   COPD (chronic obstructive pulmonary disease) (HCC)    no inhalers--smoker, no oxygen   Depression    Dyspnea    Emphysema lung (HCC)    GERD (gastroesophageal reflux disease)    Hyperlipidemia    Hypertension    Hypothyroid    Peripheral vascular disease (HCC)    Pre-diabetes    Restless leg syndrome    Seizures (HCC)    02/13/2011 -hx of seizure due to "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  no seizure since--pt does take keppra and office note from neurologist dr. Marjory Lies on this chart   Urinary incontinence     Past Surgical History:  Procedure Laterality Date   CARPAL TUNNEL RELEASE     left   COLONOSCOPY WITH PROPOFOL N/A 06/17/2022   Procedure: COLONOSCOPY WITH PROPOFOL;  Surgeon: Regis Bill, MD;  Location: [facility];  Service: Endoscopy;  Laterality: N/A;   JOINT REPLACEMENT  08/26/2011   right total knee arthroplasty, left total knee   KNEE ARTHROPLASTY  10/28/2011   Procedure: COMPUTER ASSISTED TOTAL KNEE ARTHROPLASTY;  Surgeon: Carlisle Beers Rendall III;  Location: [facility];  Service: Orthopedics;  Laterality: Left;  preop femoral nerve block   KNEE SURGERY      right   laminectomy and diskectomy     L4-5 with fusion   PROCTOSCOPY N/A 06/18/2022   Procedure: RIGID PROCTOSCOPY;  Surgeon: Karie Soda, MD;  Location: [facility];  Service: General;  Laterality: N/A;    Social History   Socioeconomic History   Marital status: Widowed    Spouse name: Not on file   Number of children: Not on file   Years of education: Not on file   Highest education level: Not on file  Occupational History   Not on file  Tobacco Use   Smoking status: Every Day    Current packs/day: 2.00    Average packs/day: 2.0 packs/day for 50.0 years (100.0 ttl pk-yrs)    Types: Cigarettes    Passive exposure: Current   Smokeless tobacco: Never  Vaping Use   Vaping status: Every Day   Substances: Nicotine  Substance and Sexual Activity   Alcohol use: Yes    Comment: occasional   Drug use: No   Sexual activity: Yes    Birth control/protection: Post-menopausal  Other Topics Concern   Not on file  Social History Narrative   ** Merged History Encounter **       Social Determinants of Health   Financial Resource Strain: Low Risk  (08/10/2023)   Received from [facility]   Overall Financial Resource Strain (CARDIA)    Difficulty  of Paying Living Expenses: Not hard at all  Food Insecurity: No Food Insecurity (08/10/2023)   Received from [facility]   Hunger Vital Sign    Worried About Running Out of Food in the Last Year: Never true    Ran Out of Food in the Last Year: Never true  Transportation Needs: No Transportation Needs (08/10/2023)   Received from [facility] - Transportation    In the past 12 months, has lack of transportation kept you from medical appointments or from getting medications?: No    Lack of Transportation (Non-Medical): No  Physical Activity: Not on file  Stress: Not on file  Social Connections: Not on file  Intimate Partner Violence: Not At Risk (04/27/2023)   Humiliation, Afraid,  Rape, and Kick questionnaire    Fear of Current or Ex-Partner: No    Emotionally Abused: No    Physically Abused: No    Sexually Abused: No    Family History  Problem Relation Age of Onset   Heart attack Father 37   Alzheimer'Sloane Junkin disease Mother    Breast cancer Neg Hx     Allergies  Allergen Reactions   Nsaids Other (See Comments)    Pt states it messes up her kidneys But takes aspirin & ibuprofen   Thiazide-Type Diuretics     Other reaction(Aamori Mcmasters): Unknown    Outpatient Medications Prior to Visit  Medication Sig   acidophilus (RISAQUAD) CAPS capsule Take 1 capsule by mouth daily.   albuterol (VENTOLIN HFA) 108 (90 Base) MCG/ACT inhaler Inhale 2 puffs into the lungs every 4 (four) hours as needed for wheezing or shortness of breath.   budesonide-formoterol (SYMBICORT) 160-4.5 MCG/ACT inhaler Inhale 2 puffs into the lungs 2 (two) times daily.   busPIRone (BUSPAR) 30 MG tablet Take 1 tablet (30 mg total) by mouth 2 (two) times daily.   cloNIDine (CATAPRES) 0.2 MG tablet Take 1 tablet (0.2 mg total) by mouth 2 (two) times daily.   diltiazem (CARDIZEM CD) 180 MG 24 hr capsule Take 180 mg by mouth daily.   escitalopram (LEXAPRO) 20 MG tablet Take 1 tablet (20 mg total) by mouth daily.   famotidine (PEPCID) 40 MG tablet Take 40 mg by mouth at bedtime.   fenofibrate micronized (LOFIBRA) 134 MG capsule Take 1 capsule (134 mg total) by mouth daily before breakfast.   fluticasone (FLONASE) 50 MCG/ACT nasal spray Place 1 spray into both nostrils daily.   gabapentin (NEURONTIN) 800 MG tablet Take 1 tablet (800 mg total) by mouth 3 (three) times daily.   hydrALAZINE (APRESOLINE) 10 MG tablet Take 1 tablet (10 mg total) by mouth 3 (three) times daily as needed. Please take if your blood pressure is above 150/90. You can repeat the dose as needed up with up to 3 pills per day. If your blood pressure is not improving with this, please call your doctor'Brailyn Killion office.   HYDROcodone-acetaminophen  (NORCO/VICODIN) 5-325 MG tablet Take 1 tablet by mouth 3 (three) times daily as needed.   levothyroxine (SYNTHROID) 25 MCG tablet Take 1 tablet (25 mcg total) by mouth daily before breakfast.   minoxidil (LONITEN) 2.5 MG tablet Take 3 tablets (7.5 mg total) by mouth daily.   olmesartan (BENICAR) 40 MG tablet TAKE 1 TABLET BY MOUTH EVERY DAY   OXYGEN Inhale 3 L into the lungs at bedtime.   pantoprazole (PROTONIX) 40 MG tablet Take 1 tablet (40 mg total) by mouth every morning.   pilocarpine (SALAGEN) 5  MG tablet    pramipexole (MIRAPEX) 0.5 MG tablet Take 1 tablet (0.5 mg total) by mouth at bedtime.   SPIRIVA HANDIHALER 18 MCG inhalation capsule 1 capsule daily.   traZODone (DESYREL) 50 MG tablet Take 0.5-1 tablets (25-50 mg total) by mouth at bedtime as needed for sleep.   trimethoprim (TRIMPEX) 100 MG tablet Take 1 tablet (100 mg total) by mouth daily.   Facility-Administered Medications Prior to Visit  Medication Dose Route Frequency Provider   chlorhexidine (HIBICLENS) 4 % liquid 4 application  60 mL Topical Once Duffy, Lucienne Minks, PA-C   chlorhexidine (HIBICLENS) 4 % liquid 4 application  60 mL Topical Once Duffy, Lucienne Minks, PA-C    Review of Systems  Constitutional: Negative.   HENT: Negative.    Eyes: Negative.   Respiratory:  Positive for shortness of breath (on exertion).   Cardiovascular: Negative.   Gastrointestinal:  Positive for constipation.  Genitourinary: Negative.   Skin: Negative.   Neurological:  Positive for headaches (chronic).  Endo/Heme/Allergies: Negative.        Objective:   BP 130/78   Pulse 73   Ht 5\' 6"  (1.676 m)   Wt 181 lb (82.1 kg)   SpO2 95%   BMI 29.21 kg/m   Vitals:   11/14/23 1408 11/14/23 1445  BP: (!) 149/89 130/78  Pulse: 73   Height: 5\' 6"  (1.676 m)   Weight: 181 lb (82.1 kg)   SpO2: 95%   BMI (Calculated): 29.23     Physical Exam Vitals reviewed.  Constitutional:      General: She is not in acute distress. HENT:     Head:  Normocephalic.     Nose: Nose normal.     Mouth/Throat:     Mouth: Mucous membranes are moist.  Eyes:     Extraocular Movements: Extraocular movements intact.     Pupils: Pupils are equal, round, and reactive to light.  Cardiovascular:     Rate and Rhythm: Normal rate and regular rhythm.     Heart sounds: No murmur heard. Pulmonary:     Effort: Pulmonary effort is normal.     Breath sounds: Decreased air movement present. No rhonchi or rales.  Abdominal:     General: Abdomen is flat.     Palpations: There is no hepatomegaly, splenomegaly or mass.     Tenderness: There is abdominal tenderness in the epigastric area and suprapubic area. There is no guarding or rebound.  Musculoskeletal:        General: Normal range of motion.     Cervical back: Normal range of motion. No tenderness.     Comments: Boot in place  Skin:    General: Skin is warm and dry.     Comments: Onychomycosis of right big toenail. Right leg wound dressed.  Neurological:     General: No focal deficit present.     Mental Status: She is alert and oriented to person, place, and time.     Cranial Nerves: No cranial nerve deficit.     Motor: No weakness.  Psychiatric:        Mood and Affect: Mood normal.        Behavior: Behavior normal.      No results found for any visits on 11/14/23.      Assessment & Plan:  As per problem list.  Problem List Items Addressed This Visit       Cardiovascular and Mediastinum   Hypertension - Primary     Endocrine   Hypothyroidism  Relevant Orders   TSH     Other   Mixed hyperlipidemia   Relevant Orders   Lipid panel   Comprehensive metabolic panel    Return in about 3 months (around 02/12/2024) for fu with labs prior.   Total time spent: 20 minutes  Luna Fuse, MD  11/14/2023   This document may have been prepared by [facility] Voice Recognition software and as such may include unintentional dictation errors.

## 2023-11-17 DIAGNOSIS — J439 Emphysema, unspecified: Secondary | ICD-10-CM | POA: Diagnosis not present

## 2023-11-17 DIAGNOSIS — G4733 Obstructive sleep apnea (adult) (pediatric): Secondary | ICD-10-CM | POA: Diagnosis not present

## 2023-11-17 DIAGNOSIS — Z720 Tobacco use: Secondary | ICD-10-CM | POA: Diagnosis not present

## 2023-11-17 DIAGNOSIS — K621 Rectal polyp: Secondary | ICD-10-CM | POA: Diagnosis not present

## 2023-11-21 DIAGNOSIS — G4733 Obstructive sleep apnea (adult) (pediatric): Secondary | ICD-10-CM | POA: Diagnosis not present

## 2023-11-21 DIAGNOSIS — M5416 Radiculopathy, lumbar region: Secondary | ICD-10-CM | POA: Diagnosis not present

## 2023-11-28 ENCOUNTER — Other Ambulatory Visit: Payer: Self-pay | Admitting: Internal Medicine

## 2023-11-28 ENCOUNTER — Ambulatory Visit: Payer: 59 | Admitting: Cardiology

## 2023-11-28 ENCOUNTER — Encounter: Payer: Self-pay | Admitting: Internal Medicine

## 2023-11-28 ENCOUNTER — Ambulatory Visit: Payer: 59 | Admitting: Internal Medicine

## 2023-11-28 VITALS — BP 160/76 | HR 72 | Temp 97.9°F | Ht 66.0 in | Wt 182.2 lb

## 2023-11-28 DIAGNOSIS — I10 Essential (primary) hypertension: Secondary | ICD-10-CM

## 2023-11-28 DIAGNOSIS — R6889 Other general symptoms and signs: Secondary | ICD-10-CM

## 2023-11-28 DIAGNOSIS — M47816 Spondylosis without myelopathy or radiculopathy, lumbar region: Secondary | ICD-10-CM

## 2023-11-28 DIAGNOSIS — J0100 Acute maxillary sinusitis, unspecified: Secondary | ICD-10-CM

## 2023-11-28 DIAGNOSIS — J069 Acute upper respiratory infection, unspecified: Secondary | ICD-10-CM

## 2023-11-28 LAB — POCT XPERT XPRESS SARS COVID-2/FLU/RSV
FLU A: NEGATIVE
FLU B: NEGATIVE
RSV RNA, PCR: NEGATIVE
SARS Coronavirus 2: NEGATIVE

## 2023-11-28 MED ORDER — AMOXICILLIN-POT CLAVULANATE 875-125 MG PO TABS: 1.0000 | ORAL_TABLET | Freq: Two times a day (BID) | ORAL | 0 refills | Status: AC

## 2023-11-28 NOTE — Progress Notes (Signed)
Established Patient Office Visit  Subjective:  Patient ID: [name], female    DOB: 02-02-1956  Age: 67 y.o. MRN: [id number]  Chief Complaint  Patient presents with   Sore Throat   Cough    SUBJECTIVE:  [name] is a 67 y.o. female who complains of congestion, sore throat, nasal blockage, productive cough, and headache for 3 days. She denies a history of fatigue and denies a history of asthma. Patient does smoke cigarettes. Denies any sick contacts.    ASSESSMENT:  viral upper respiratory illness  PLAN: Symptomatic therapy suggested: push fluids, rest, and return office visit prn if symptoms persist or worsen. Lack of antibiotic effectiveness discussed with her. Call or return to clinic prn if these symptoms worsen or fail to improve as anticipated.     No other concerns at this time.   Past Medical History:  Diagnosis Date   (HFpEF) heart failure with preserved ejection fraction (HCC)    Acute renal failure (HCC) 03/29/2011   Acute respiratory distress 11/2020   Acute respiratory failure (HCC) 04/14/2014   Anxiety    Arthritis    CHF (congestive heart failure) (HCC)    Chronic respiratory failure with hypoxia and hypercapnia (HCC)    Community acquired pneumonia 01/10/2013   COPD (chronic obstructive pulmonary disease) (HCC)    no inhalers--smoker, no oxygen   Depression    Dyspnea    Emphysema lung (HCC)    GERD (gastroesophageal reflux disease)    Hyperlipidemia    Hypertension    Hypothyroid    Peripheral vascular disease (HCC)    Pre-diabetes    Restless leg syndrome    Seizures (HCC)    02/13/2011 -hx of seizure due to "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  no seizure since--pt does take keppra and office note from neurologist dr. Marjory Lies on this chart   Urinary incontinence     Past Surgical History:  Procedure Laterality Date   CARPAL  TUNNEL RELEASE     left   COLONOSCOPY WITH PROPOFOL N/A 06/17/2022   Procedure: COLONOSCOPY WITH PROPOFOL;  Surgeon: Regis Bill, MD;  Location: [facility];  Service: Endoscopy;  Laterality: N/A;   JOINT REPLACEMENT  08/26/2011   right total knee arthroplasty, left total knee   KNEE ARTHROPLASTY  10/28/2011   Procedure: COMPUTER ASSISTED TOTAL KNEE ARTHROPLASTY;  Surgeon: Carlisle Beers Rendall III;  Location: [facility];  Service: Orthopedics;  Laterality: Left;  preop femoral nerve block   KNEE SURGERY     right   laminectomy and diskectomy     L4-5 with fusion   PROCTOSCOPY N/A 06/18/2022   Procedure: RIGID PROCTOSCOPY;  Surgeon: Karie Soda, MD;  Location: [facility];  Service: General;  Laterality: N/A;    Social History   Socioeconomic History   Marital status: Widowed    Spouse name: Not on file   Number of children: Not on file   Years of education: Not on file   Highest education level: Not on file  Occupational History   Not on file  Tobacco Use   Smoking status: Every Day    Current packs/day: 2.00    Average packs/day: 2.0 packs/day for 50.0 years (100.0 ttl pk-yrs)    Types: Cigarettes    Passive exposure: Current   Smokeless tobacco: Never  Vaping Use   Vaping status: Every Day   Substances: Nicotine  Substance and Sexual Activity   Alcohol use: Yes    Comment: occasional   Drug use: No   Sexual activity: Yes    Birth control/protection: Post-menopausal  Other Topics Concern   Not on file  Social History Narrative   ** Merged History Encounter **       Social Drivers of Health   Financial Resource Strain: Low Risk  (08/10/2023)   Received from [facility]   Overall Financial Resource Strain (CARDIA)    Difficulty of Paying Living Expenses: Not hard at all  Food Insecurity: No Food Insecurity (08/10/2023)   Received from [facility]   Hunger Vital Sign    Worried About Running Out of Food in the Last Year: Never true     Ran Out of Food in the Last Year: Never true  Transportation Needs: No Transportation Needs (08/10/2023)   Received from [facility] Transportation    In the past 12 months, has lack of transportation kept you from medical appointments or from getting medications?: No    Lack of Transportation (Non-Medical): No  Physical Activity: Not on file  Stress: Not on file  Social Connections: Not on file  Intimate Partner Violence: Not At Risk (04/27/2023)   Humiliation, Afraid, Rape, and Kick questionnaire    Fear of Current or Ex-Partner: No    Emotionally Abused: No    Physically Abused: No    Sexually Abused: No    Family History  Problem Relation Age of Onset   Heart attack Father 64   Alzheimer'Phillipe Clemon disease Mother    Breast cancer Neg Hx     Allergies  Allergen Reactions   Nsaids Other (See Comments)    Pt states it messes up her kidneys But takes aspirin & ibuprofen   Thiazide-Type Diuretics     Other reaction(Raniyah Curenton): Unknown    Outpatient Medications Prior to Visit  Medication Sig   acidophilus (RISAQUAD) CAPS capsule Take 1 capsule by mouth daily.   albuterol (VENTOLIN HFA) 108 (90 Base) MCG/ACT inhaler Inhale 2 puffs into the lungs every 4 (four) hours as needed for wheezing or shortness of breath.   budesonide-formoterol (SYMBICORT) 160-4.5 MCG/ACT inhaler Inhale 2 puffs into the lungs 2 (two) times daily.   busPIRone (BUSPAR) 30 MG tablet Take 1 tablet (30 mg total) by mouth 2 (two) times daily.   cloNIDine (CATAPRES) 0.2 MG tablet Take 1 tablet (0.2 mg total) by mouth 2 (two) times daily.   diltiazem (CARDIZEM CD) 180 MG 24 hr capsule Take 180 mg by mouth daily.   escitalopram (LEXAPRO) 20 MG tablet Take 1 tablet (20 mg total) by mouth daily.   famotidine (PEPCID) 40 MG tablet Take 40 mg by mouth at bedtime.   fenofibrate micronized (LOFIBRA) 134 MG capsule Take 1 capsule (134 mg total) by mouth daily before breakfast.   fluticasone (FLONASE) 50  MCG/ACT nasal spray Place 1 spray into both nostrils daily.   gabapentin (NEURONTIN) 800 MG tablet Take 1 tablet (800 mg total) by mouth 3 (three) times daily.   hydrALAZINE (APRESOLINE) 10 MG tablet Take 1 tablet (10 mg total) by mouth 3 (three) times daily as needed. Please take if your blood pressure is above 150/90. You can repeat the dose as needed up with up to 3 pills per day. If your blood pressure is not improving with this, please call your doctor'Kenneisha Cochrane office.   HYDROcodone-acetaminophen (NORCO/VICODIN) 5-325 MG tablet Take 1 tablet by  mouth 3 (three) times daily as needed.   levothyroxine (SYNTHROID) 25 MCG tablet Take 1 tablet (25 mcg total) by mouth daily before breakfast.   minoxidil (LONITEN) 2.5 MG tablet Take 3 tablets (7.5 mg total) by mouth daily.   olmesartan (BENICAR) 40 MG tablet TAKE 1 TABLET BY MOUTH EVERY DAY   OXYGEN Inhale 3 L into the lungs at bedtime.   pantoprazole (PROTONIX) 40 MG tablet Take 1 tablet (40 mg total) by mouth every morning.   pilocarpine (SALAGEN) 5 MG tablet    pramipexole (MIRAPEX) 0.5 MG tablet Take 1 tablet (0.5 mg total) by mouth at bedtime.   SPIRIVA HANDIHALER 18 MCG inhalation capsule 1 capsule daily.   traZODone (DESYREL) 50 MG tablet Take 0.5-1 tablets (25-50 mg total) by mouth at bedtime as needed for sleep.   trimethoprim (TRIMPEX) 100 MG tablet Take 1 tablet (100 mg total) by mouth daily.   Facility-Administered Medications Prior to Visit  Medication Dose Route Frequency Provider   chlorhexidine (HIBICLENS) 4 % liquid 4 application  60 mL Topical Once Duffy, Lucienne Minks, PA-C   chlorhexidine (HIBICLENS) 4 % liquid 4 application  60 mL Topical Once Duffy, Lucienne Minks, PA-C    ROS     Objective:   BP (!) 160/76   Pulse 72   Temp 97.9 F (36.6 C) (Tympanic)   Ht 5\' 6"  (1.676 m)   Wt 182 lb 3.2 oz (82.6 kg)   SpO2 94%   BMI 29.41 kg/m   Vitals:   11/28/23 1515  BP: (!) 160/76  Pulse: 72  Temp: 97.9 F (36.6 C)  Height: 5\' 6"  (1.676 m)   Weight: 182 lb 3.2 oz (82.6 kg)  SpO2: 94%  TempSrc: Tympanic  BMI (Calculated): 29.42   OBJECTIVE: She appears well, vital signs are as noted. Ears normal.  Throat and pharynx normal.  Neck supple. No adenopathy in the neck. Nose is congested. The chest is clear, without wheezes or rales.  Physical Exam Neck:     Comments: Left maxillary sinus tenderness Cardiovascular:     Rate and Rhythm: Normal rate and regular rhythm.  Abdominal:     Palpations: Abdomen is soft.  Lymphadenopathy:     Cervical: Cervical adenopathy present.     Right cervical: Superficial cervical adenopathy (submandibular enlarged and tender) present.  Skin:    General: Skin is warm and dry.  Neurological:     General: No focal deficit present.      No results found for any visits on 11/28/23.      Assessment & Plan:  As per problem list  Problem List Items Addressed This Visit       Respiratory   Acute maxillary sinusitis   Relevant Medications   amoxicillin-clavulanate (AUGMENTIN) 875-125 MG tablet   Other Visit Diagnoses       Upper respiratory tract infection, unspecified type    -  Primary   Relevant Orders   POCT XPERT XPRESS SARS COVID-2/FLU/RSV [[id number]]       Return if symptoms worsen or fail to improve.   Total time spent: 30 minutes  Luna Fuse, MD  11/28/2023   This document may have been prepared by [facility] Voice Recognition software and as such may include unintentional dictation errors.

## 2023-11-29 ENCOUNTER — Other Ambulatory Visit: Payer: Self-pay | Admitting: Internal Medicine

## 2023-11-29 ENCOUNTER — Telehealth: Payer: Self-pay | Admitting: Internal Medicine

## 2023-11-29 DIAGNOSIS — M47816 Spondylosis without myelopathy or radiculopathy, lumbar region: Secondary | ICD-10-CM

## 2023-11-29 MED ORDER — GABAPENTIN 800 MG PO TABS: 800.0000 mg | ORAL_TABLET | Freq: Three times a day (TID) | ORAL | 0 refills | Status: DC

## 2023-11-29 NOTE — Telephone Encounter (Signed)
PT would like her gabapentin 800 mg to be sent to [facility] in [place]

## 2023-11-30 ENCOUNTER — Other Ambulatory Visit: Payer: Self-pay | Admitting: Internal Medicine

## 2023-12-08 ENCOUNTER — Other Ambulatory Visit: Payer: Self-pay

## 2023-12-13 DIAGNOSIS — M7061 Trochanteric bursitis, right hip: Secondary | ICD-10-CM | POA: Diagnosis not present

## 2024-01-03 DIAGNOSIS — D631 Anemia in chronic kidney disease: Secondary | ICD-10-CM | POA: Diagnosis not present

## 2024-01-03 DIAGNOSIS — H2513 Age-related nuclear cataract, bilateral: Secondary | ICD-10-CM | POA: Diagnosis not present

## 2024-01-03 DIAGNOSIS — I129 Hypertensive chronic kidney disease with stage 1 through stage 4 chronic kidney disease, or unspecified chronic kidney disease: Secondary | ICD-10-CM | POA: Diagnosis not present

## 2024-01-03 DIAGNOSIS — R319 Hematuria, unspecified: Secondary | ICD-10-CM | POA: Diagnosis not present

## 2024-01-03 DIAGNOSIS — E119 Type 2 diabetes mellitus without complications: Secondary | ICD-10-CM | POA: Diagnosis not present

## 2024-01-03 DIAGNOSIS — M3501 Sicca syndrome with keratoconjunctivitis: Secondary | ICD-10-CM | POA: Diagnosis not present

## 2024-01-03 DIAGNOSIS — N1832 Chronic kidney disease, stage 3b: Secondary | ICD-10-CM | POA: Diagnosis not present

## 2024-01-03 DIAGNOSIS — R809 Proteinuria, unspecified: Secondary | ICD-10-CM | POA: Diagnosis not present

## 2024-01-04 ENCOUNTER — Other Ambulatory Visit: Payer: Self-pay | Admitting: Internal Medicine

## 2024-01-04 DIAGNOSIS — E782 Mixed hyperlipidemia: Secondary | ICD-10-CM

## 2024-01-05 ENCOUNTER — Other Ambulatory Visit: Payer: Self-pay | Admitting: Internal Medicine

## 2024-01-09 ENCOUNTER — Other Ambulatory Visit: Payer: Self-pay | Admitting: Internal Medicine

## 2024-01-10 DIAGNOSIS — G4733 Obstructive sleep apnea (adult) (pediatric): Secondary | ICD-10-CM | POA: Diagnosis not present

## 2024-01-19 ENCOUNTER — Other Ambulatory Visit: Payer: Self-pay

## 2024-01-19 DIAGNOSIS — G8929 Other chronic pain: Secondary | ICD-10-CM | POA: Diagnosis not present

## 2024-01-19 DIAGNOSIS — M533 Sacrococcygeal disorders, not elsewhere classified: Secondary | ICD-10-CM | POA: Diagnosis not present

## 2024-01-20 MED ORDER — BUSPIRONE HCL 30 MG PO TABS: 30.0000 mg | ORAL_TABLET | Freq: Two times a day (BID) | ORAL | 3 refills | Status: DC

## 2024-01-21 ENCOUNTER — Emergency Department
Admission: EM | Admit: 2024-01-21 | Discharge: 2024-01-21 | Disposition: A | Discharge status: Home / Self Care | Payer: 59 | Attending: Emergency Medicine | Admitting: Emergency Medicine

## 2024-01-21 ENCOUNTER — Emergency Department: Payer: 59

## 2024-01-21 ENCOUNTER — Other Ambulatory Visit: Payer: Self-pay

## 2024-01-21 DIAGNOSIS — S62316A Displaced fracture of base of fifth metacarpal bone, right hand, initial encounter for closed fracture: Secondary | ICD-10-CM | POA: Diagnosis not present

## 2024-01-21 DIAGNOSIS — S62307A Unspecified fracture of fifth metacarpal bone, left hand, initial encounter for closed fracture: Secondary | ICD-10-CM | POA: Insufficient documentation

## 2024-01-21 DIAGNOSIS — N189 Chronic kidney disease, unspecified: Secondary | ICD-10-CM | POA: Insufficient documentation

## 2024-01-21 DIAGNOSIS — W1839XA Other fall on same level, initial encounter: Secondary | ICD-10-CM | POA: Diagnosis not present

## 2024-01-21 DIAGNOSIS — S62306A Unspecified fracture of fifth metacarpal bone, right hand, initial encounter for closed fracture: Secondary | ICD-10-CM | POA: Diagnosis not present

## 2024-01-21 DIAGNOSIS — I129 Hypertensive chronic kidney disease with stage 1 through stage 4 chronic kidney disease, or unspecified chronic kidney disease: Secondary | ICD-10-CM | POA: Insufficient documentation

## 2024-01-21 DIAGNOSIS — Z043 Encounter for examination and observation following other accident: Secondary | ICD-10-CM | POA: Diagnosis not present

## 2024-01-21 DIAGNOSIS — S62339A Displaced fracture of neck of unspecified metacarpal bone, initial encounter for closed fracture: Secondary | ICD-10-CM

## 2024-01-21 DIAGNOSIS — Y92512 Supermarket, store or market as the place of occurrence of the external cause: Secondary | ICD-10-CM | POA: Insufficient documentation

## 2024-01-21 DIAGNOSIS — S62337A Displaced fracture of neck of fifth metacarpal bone, left hand, initial encounter for closed fracture: Secondary | ICD-10-CM | POA: Diagnosis not present

## 2024-01-21 DIAGNOSIS — I509 Heart failure, unspecified: Secondary | ICD-10-CM | POA: Insufficient documentation

## 2024-01-21 DIAGNOSIS — E1122 Type 2 diabetes mellitus with diabetic chronic kidney disease: Secondary | ICD-10-CM | POA: Insufficient documentation

## 2024-01-21 DIAGNOSIS — W19XXXA Unspecified fall, initial encounter: Secondary | ICD-10-CM

## 2024-01-21 DIAGNOSIS — M79642 Pain in left hand: Secondary | ICD-10-CM | POA: Diagnosis present

## 2024-01-21 MED ORDER — OXYCODONE-ACETAMINOPHEN 5-325 MG PO TABS
1.0000 | ORAL_TABLET | Freq: Once | ORAL | Status: AC
  Administered 2024-01-21: 1 via ORAL
  Filled 2024-01-21: qty 1

## 2024-01-21 MED ORDER — OXYCODONE-ACETAMINOPHEN 5-325 MG PO TABS: 1.0000 | ORAL_TABLET | ORAL | 0 refills | Status: DC | PRN

## 2024-01-21 NOTE — ED Provider Notes (Signed)
 [facility] Provider Note    Event Date/Time   First MD Initiated Contact with Patient 01/21/24 1935     (approximate)   History   Chief Complaint Fall and Hand Injury   HPI  [name] is a 68 y.o. female with past medical history of hypertension, diabetes, COPD, CHF, CKD, and chronic pain syndrome who presents to the ED complaining of fall.  Patient reports that just prior to arrival she was at [facility] when she lost her balance while getting a cart.  She fell backwards and hit her head as well as her right hip and left hand.  She denies loss of consciousness, does not take any blood thinners, and denies any headache or neck pain.  She does complain of pain to her left hand as well as her right hip, although she has been able to walk since the fall.     Physical Exam   Triage Vital Signs: ED Triage Vitals [01/21/24 1849]  Encounter Vitals Group     BP (!) 192/96     Systolic BP Percentile      Diastolic BP Percentile      Pulse Rate 81     Resp 18     Temp 98.8 F (37.1 C)     Temp Source Oral     SpO2 95 %     Weight      Height      Head Circumference      Peak Flow      Pain Score 9     Pain Loc      Pain Education      Exclude from Growth Chart     Most recent vital signs: Vitals:   01/21/24 1849  BP: (!) 192/96  Pulse: 81  Resp: 18  Temp: 98.8 F (37.1 C)  SpO2: 95%    Constitutional: Alert and oriented. Eyes: Conjunctivae are normal. Head: Atraumatic. Nose: No congestion/rhinnorhea. Mouth/Throat: Mucous membranes are moist.  Neck: No midline cervical spine tenderness to palpation. Cardiovascular: Normal rate, regular rhythm. Grossly normal heart sounds.  2+ radial pulses bilaterally. Respiratory: Normal respiratory effort.  No retractions. Lungs CTAB. Gastrointestinal: Soft and nontender. No distention. Musculoskeletal: No lower extremity tenderness nor edema.  Skin tear to the dorsum of left hand with  tenderness over the lateral portion, no obvious deformity. Neurologic:  Normal speech and language. No gross focal neurologic deficits are appreciated.    ED Results / Procedures / Treatments   Labs (all labs ordered are listed, but only abnormal results are displayed) Labs Reviewed - No data to display  RADIOLOGY Pelvis x-ray reviewed and interpreted by me with no fracture or dislocation.  PROCEDURES:  Critical Care performed: No  Procedures   MEDICATIONS ORDERED IN ED: Medications  oxyCODONE -acetaminophen  (PERCOCET/ROXICET) 5-325 MG per tablet 1 tablet (has no administration in time range)     IMPRESSION / MDM / ASSESSMENT AND PLAN / ED COURSE  I reviewed the triage vital signs and the nursing notes.                              68 y.o. female with past medical history of hypertension, diabetes, COPD, CHF, CKD, and chronic pain syndrome who presents to the ED complaining of right hip pain and left hand pain following a fall.  Patient's presentation is most consistent with acute complicated illness / injury requiring diagnostic workup.  Differential  diagnosis includes, but is not limited to, fracture, dislocation, contusion.  Patient nontoxic-appearing and in no acute distress, vital signs are unremarkable.  She has no signs of significant injury to her head and neck, does not take a blood thinner and I do not feel CT head or cervical spine are warranted at this time.  She does have a fracture of her left fifth metacarpal and we will place her in a volar splint, also has some skin tears here but no laceration to suggest open fracture.  This injury is appropriate for outpatient follow-up with hand specialist, we will give dose of pain medicine here in the ED.  Patient reports some right hip pain but has been ambulatory without difficulty, x-ray of her pelvis is unremarkable, doubt occult fracture of the hip.  Splint in place and patient appropriate for discharge home with  outpatient follow-up, was counseled to return to the ED for new or worsening symptoms.  Patient agrees with plan.      FINAL CLINICAL IMPRESSION(S) / ED DIAGNOSES   Final diagnoses:  Fall, initial encounter  Closed boxer's fracture, initial encounter     Rx / DC Orders   ED Discharge Orders     None        Note:  This document was prepared using Dragon voice recognition software and may include unintentional dictation errors.   Willo Dunnings, MD 01/21/24 [phone]

## 2024-01-21 NOTE — ED Triage Notes (Addendum)
 [place] had mechanical [place] [employer] [place]- pt c/o left hand injury and right hip pain. Pt states she hit her head, but denies pain or injury at this time. Pt is AOX4, NAD noted. No thinners, no LOC. Two skin tears/lacerations noted on posterior left hand. No bleeding at this time- gauze dressing applied. Pt endorses tetanus booster in June. Pt able to ambulate at this time w/ mild limp noted.

## 2024-01-25 DIAGNOSIS — M2170 Unequal limb length (acquired), unspecified site: Secondary | ICD-10-CM | POA: Diagnosis not present

## 2024-01-25 DIAGNOSIS — Z87891 Personal history of nicotine dependence: Secondary | ICD-10-CM | POA: Diagnosis not present

## 2024-01-25 DIAGNOSIS — M79674 Pain in right toe(s): Secondary | ICD-10-CM | POA: Diagnosis not present

## 2024-01-25 DIAGNOSIS — M792 Neuralgia and neuritis, unspecified: Secondary | ICD-10-CM | POA: Diagnosis not present

## 2024-01-25 DIAGNOSIS — M79675 Pain in left toe(s): Secondary | ICD-10-CM | POA: Diagnosis not present

## 2024-01-25 DIAGNOSIS — E1142 Type 2 diabetes mellitus with diabetic polyneuropathy: Secondary | ICD-10-CM | POA: Diagnosis not present

## 2024-01-25 DIAGNOSIS — M545 Low back pain, unspecified: Secondary | ICD-10-CM | POA: Diagnosis not present

## 2024-01-25 DIAGNOSIS — B351 Tinea unguium: Secondary | ICD-10-CM | POA: Diagnosis not present

## 2024-01-25 DIAGNOSIS — G8929 Other chronic pain: Secondary | ICD-10-CM | POA: Diagnosis not present

## 2024-01-26 ENCOUNTER — Other Ambulatory Visit: Payer: Self-pay | Admitting: Surgery

## 2024-01-26 DIAGNOSIS — E875 Hyperkalemia: Secondary | ICD-10-CM | POA: Diagnosis not present

## 2024-01-26 DIAGNOSIS — N1832 Chronic kidney disease, stage 3b: Secondary | ICD-10-CM | POA: Diagnosis not present

## 2024-01-26 DIAGNOSIS — R809 Proteinuria, unspecified: Secondary | ICD-10-CM | POA: Diagnosis not present

## 2024-01-26 DIAGNOSIS — S62327A Displaced fracture of shaft of fifth metacarpal bone, left hand, initial encounter for closed fracture: Secondary | ICD-10-CM | POA: Diagnosis not present

## 2024-01-26 DIAGNOSIS — Z720 Tobacco use: Secondary | ICD-10-CM | POA: Diagnosis not present

## 2024-01-26 DIAGNOSIS — D631 Anemia in chronic kidney disease: Secondary | ICD-10-CM | POA: Diagnosis not present

## 2024-01-26 DIAGNOSIS — I129 Hypertensive chronic kidney disease with stage 1 through stage 4 chronic kidney disease, or unspecified chronic kidney disease: Secondary | ICD-10-CM | POA: Diagnosis not present

## 2024-01-26 DIAGNOSIS — R319 Hematuria, unspecified: Secondary | ICD-10-CM | POA: Diagnosis not present

## 2024-01-26 DIAGNOSIS — E1122 Type 2 diabetes mellitus with diabetic chronic kidney disease: Secondary | ICD-10-CM | POA: Diagnosis not present

## 2024-01-27 ENCOUNTER — Encounter
Admission: RE | Admit: 2024-01-27 | Discharge: 2024-01-27 | Disposition: A | Discharge status: Home / Self Care | Payer: 59 | Source: Ambulatory Visit | Attending: Surgery | Admitting: Surgery

## 2024-01-27 ENCOUNTER — Other Ambulatory Visit: Payer: Self-pay

## 2024-01-27 VITALS — Ht 66.0 in | Wt 181.0 lb

## 2024-01-27 DIAGNOSIS — I5032 Chronic diastolic (congestive) heart failure: Secondary | ICD-10-CM

## 2024-01-27 DIAGNOSIS — Z720 Tobacco use: Secondary | ICD-10-CM

## 2024-01-27 DIAGNOSIS — I10 Essential (primary) hypertension: Secondary | ICD-10-CM

## 2024-01-27 DIAGNOSIS — E782 Mixed hyperlipidemia: Secondary | ICD-10-CM

## 2024-01-27 HISTORY — DX: Sepsis, unspecified organism: A41.9

## 2024-01-27 HISTORY — DX: Sleep apnea, unspecified: G47.30

## 2024-01-27 NOTE — Telephone Encounter (Signed)
Enter error-hq

## 2024-01-27 NOTE — Patient Instructions (Addendum)
Your procedure is scheduled on: Monday 01/30/24 To find out your arrival time, please call [phone] between 1PM - 3PM on:  Friday 01/27/24  Report to the Registration Desk on the 1st floor of the [facility]. FREE Valet parking is available.  If your arrival time is 6:00 am, do not arrive before that time as the [facility] entrance doors do not open until 6:00 am.  REMEMBER: Instructions that are not followed completely may result in serious medical risk, up to and including death; or upon the discretion of your surgeon and anesthesiologist your surgery may need to be rescheduled.  Do not eat food after midnight the night before surgery.  No gum chewing or hard candies.  You may however, drink CLEAR liquids up to 2 hours before you are scheduled to arrive for your surgery. Do not drink anything within 2 hours of your scheduled arrival time.  Clear liquids include: - water  - apple juice without pulp - gatorade (not RED colors) - black coffee or tea (Do NOT add milk or creamers to the coffee or tea) Do NOT drink anything that is not on this list.  Type 1 and Type 2 diabetics should only drink water.  One week prior to surgery: Stop Anti-inflammatories (NSAIDS) such as Advil, Aleve, Ibuprofen, Motrin, Naproxen, Naprosyn and Aspirin based products such as Excedrin, Goody's Powder, BC Powder. You may however, continue to take Tylenol if needed for pain up until the day of surgery.  Stop ANY OVER THE COUNTER supplements and vitamins until after surgery.  Continue taking all prescribed medications.   TAKE ONLY THESE MEDICATIONS THE MORNING OF SURGERY WITH A SIP OF WATER:  busPIRone (BUSPAR) 30 MG tablet  cloNIDine (CATAPRES) 0.2 MG tablet  diltiazem (CARDIZEM CD) 180 MG 24 hr capsule  escitalopram (LEXAPRO) 20 MG tablet  gabapentin (NEURONTIN) 800 MG tablet  levothyroxine (SYNTHROID) 25 MCG tablet  pantoprazole (PROTONIX) 40 MG tablet  trimethoprim (TRIMPEX) 100 MG tablet    Use inhalers on the day of surgery and bring  albuterol (VENTOLIN HFA)  to the hospital.  No Alcohol for 24 hours before or after surgery.  No Smoking including e-cigarettes for 24 hours before surgery.  No chewable tobacco products for at least 6 hours before surgery.  No nicotine patches on the day of surgery.  Do not use any "recreational" drugs for at least a week (preferably 2 weeks) before your surgery.  Please be advised that the combination of cocaine and anesthesia may have negative outcomes, up to and including death. If you test positive for cocaine, your surgery will be cancelled.  On the morning of surgery brush your teeth with toothpaste and water, you may rinse your mouth with mouthwash if you wish. Do not swallow any toothpaste or mouthwash.  Do not wear lotions, powders, or perfumes.   Do not shave body hair from the neck down 48 hours before surgery.  Wear comfortable clothing (specific to your surgery type) to the hospital.  Do not wear jewelry, make-up, hairpins, clips or nail polish.  For welded (permanent) jewelry: bracelets, anklets, waist bands, etc.  Please have this removed prior to surgery.  If it is not removed, there is a chance that hospital personnel will need to cut it off on the day of surgery. Contact lenses, hearing aids and dentures may not be worn into surgery.  Bring your C-PAP to the hospital in case you may have to spend the night.   Do not bring valuables  to the hospital. [facility] is not responsible for any missing/lost belongings or valuables.   Notify your doctor if there is any change in your medical condition (cold, fever, infection).  If you are being discharged the day of surgery, you will not be allowed to drive home. You will need a responsible individual to drive you home and stay with you for 24 hours after surgery.   If you are taking public transportation, you will need to have a responsible individual with you.  If you  are being admitted to the hospital overnight, leave your suitcase in the car. After surgery it may be brought to your room.  In case of increased patient census, it may be necessary for you, the patient, to continue your postoperative care in the Same Day Surgery department.  After surgery, you can help prevent lung complications by doing breathing exercises.  Take deep breaths and cough every 1-2 hours. Your doctor may order a device called an Incentive Spirometer to help you take deep breaths. When coughing or sneezing, hold a pillow firmly against your incision with both hands. This is called "splinting." Doing this helps protect your incision. It also decreases belly discomfort.  Surgery Visitation Policy:  Patients undergoing a surgery or procedure may have two family members or support persons with them as long as the person is not COVID-19 positive or experiencing its symptoms.   Inpatient Visitation:    Visiting hours are 7 a.m. to 8 p.m. Up to four visitors are allowed at one time in a patient room. The visitors may rotate out with other people during the day. One designated support person (adult) may remain overnight.  Due to an increase in RSV and influenza rates and associated hospitalizations, children ages 1 and under will not be able to visit patients in [facility]. Masks continue to be strongly recommended.  Please call the Pre-admissions Testing Dept. at [phone] if you have any questions about these instructions.

## 2024-01-29 ENCOUNTER — Other Ambulatory Visit: Payer: Self-pay | Admitting: Internal Medicine

## 2024-01-29 DIAGNOSIS — I10 Essential (primary) hypertension: Secondary | ICD-10-CM

## 2024-01-30 ENCOUNTER — Encounter: Payer: Self-pay | Admitting: Surgery

## 2024-01-30 ENCOUNTER — Ambulatory Visit: Payer: 59

## 2024-01-30 ENCOUNTER — Other Ambulatory Visit: Payer: Self-pay

## 2024-01-30 ENCOUNTER — Ambulatory Visit: Payer: 59 | Admitting: Urgent Care

## 2024-01-30 ENCOUNTER — Encounter
Admission: RE | Disposition: A | Discharge status: Home / Self Care | Payer: Self-pay | Source: Home / Self Care | Attending: Surgery

## 2024-01-30 ENCOUNTER — Ambulatory Visit: Payer: 59 | Admitting: Internal Medicine

## 2024-01-30 ENCOUNTER — Ambulatory Visit: Payer: 59 | Admitting: Anesthesiology

## 2024-01-30 ENCOUNTER — Ambulatory Visit
Admission: RE | Admit: 2024-01-30 | Discharge: 2024-01-30 | Disposition: A | Discharge status: Home / Self Care | Payer: 59 | Attending: Surgery | Admitting: Surgery

## 2024-01-30 DIAGNOSIS — Z720 Tobacco use: Secondary | ICD-10-CM

## 2024-01-30 DIAGNOSIS — Z9981 Dependence on supplemental oxygen: Secondary | ICD-10-CM | POA: Insufficient documentation

## 2024-01-30 DIAGNOSIS — W19XXXA Unspecified fall, initial encounter: Secondary | ICD-10-CM | POA: Insufficient documentation

## 2024-01-30 DIAGNOSIS — G4730 Sleep apnea, unspecified: Secondary | ICD-10-CM | POA: Diagnosis not present

## 2024-01-30 DIAGNOSIS — I1310 Hypertensive heart and chronic kidney disease without heart failure, with stage 1 through stage 4 chronic kidney disease, or unspecified chronic kidney disease: Secondary | ICD-10-CM | POA: Diagnosis not present

## 2024-01-30 DIAGNOSIS — F17209 Nicotine dependence, unspecified, with unspecified nicotine-induced disorders: Secondary | ICD-10-CM | POA: Diagnosis not present

## 2024-01-30 DIAGNOSIS — Z833 Family history of diabetes mellitus: Secondary | ICD-10-CM | POA: Diagnosis not present

## 2024-01-30 DIAGNOSIS — Z79899 Other long term (current) drug therapy: Secondary | ICD-10-CM | POA: Insufficient documentation

## 2024-01-30 DIAGNOSIS — Z836 Family history of other diseases of the respiratory system: Secondary | ICD-10-CM | POA: Diagnosis not present

## 2024-01-30 DIAGNOSIS — R079 Chest pain, unspecified: Secondary | ICD-10-CM | POA: Diagnosis not present

## 2024-01-30 DIAGNOSIS — I509 Heart failure, unspecified: Secondary | ICD-10-CM | POA: Insufficient documentation

## 2024-01-30 DIAGNOSIS — F419 Anxiety disorder, unspecified: Secondary | ICD-10-CM | POA: Insufficient documentation

## 2024-01-30 DIAGNOSIS — F17210 Nicotine dependence, cigarettes, uncomplicated: Secondary | ICD-10-CM | POA: Insufficient documentation

## 2024-01-30 DIAGNOSIS — K219 Gastro-esophageal reflux disease without esophagitis: Secondary | ICD-10-CM | POA: Insufficient documentation

## 2024-01-30 DIAGNOSIS — S62327A Displaced fracture of shaft of fifth metacarpal bone, left hand, initial encounter for closed fracture: Secondary | ICD-10-CM | POA: Diagnosis not present

## 2024-01-30 DIAGNOSIS — I10 Essential (primary) hypertension: Secondary | ICD-10-CM

## 2024-01-30 DIAGNOSIS — I5032 Chronic diastolic (congestive) heart failure: Secondary | ICD-10-CM

## 2024-01-30 DIAGNOSIS — N1832 Chronic kidney disease, stage 3b: Secondary | ICD-10-CM | POA: Diagnosis not present

## 2024-01-30 DIAGNOSIS — E1122 Type 2 diabetes mellitus with diabetic chronic kidney disease: Secondary | ICD-10-CM | POA: Diagnosis not present

## 2024-01-30 DIAGNOSIS — I272 Pulmonary hypertension, unspecified: Secondary | ICD-10-CM | POA: Diagnosis not present

## 2024-01-30 DIAGNOSIS — I358 Other nonrheumatic aortic valve disorders: Secondary | ICD-10-CM | POA: Diagnosis not present

## 2024-01-30 DIAGNOSIS — F32A Depression, unspecified: Secondary | ICD-10-CM | POA: Insufficient documentation

## 2024-01-30 DIAGNOSIS — Z8249 Family history of ischemic heart disease and other diseases of the circulatory system: Secondary | ICD-10-CM | POA: Diagnosis not present

## 2024-01-30 DIAGNOSIS — Z01818 Encounter for other preprocedural examination: Secondary | ICD-10-CM | POA: Diagnosis not present

## 2024-01-30 DIAGNOSIS — I340 Nonrheumatic mitral (valve) insufficiency: Secondary | ICD-10-CM | POA: Diagnosis not present

## 2024-01-30 DIAGNOSIS — I350 Nonrheumatic aortic (valve) stenosis: Secondary | ICD-10-CM | POA: Insufficient documentation

## 2024-01-30 DIAGNOSIS — I5033 Acute on chronic diastolic (congestive) heart failure: Secondary | ICD-10-CM | POA: Diagnosis not present

## 2024-01-30 DIAGNOSIS — I110 Hypertensive heart disease with heart failure: Secondary | ICD-10-CM | POA: Diagnosis not present

## 2024-01-30 DIAGNOSIS — J439 Emphysema, unspecified: Secondary | ICD-10-CM | POA: Insufficient documentation

## 2024-01-30 DIAGNOSIS — E782 Mixed hyperlipidemia: Secondary | ICD-10-CM

## 2024-01-30 DIAGNOSIS — E1151 Type 2 diabetes mellitus with diabetic peripheral angiopathy without gangrene: Secondary | ICD-10-CM | POA: Insufficient documentation

## 2024-01-30 DIAGNOSIS — E039 Hypothyroidism, unspecified: Secondary | ICD-10-CM | POA: Diagnosis not present

## 2024-01-30 DIAGNOSIS — J4489 Other specified chronic obstructive pulmonary disease: Secondary | ICD-10-CM | POA: Diagnosis not present

## 2024-01-30 DIAGNOSIS — R0602 Shortness of breath: Secondary | ICD-10-CM | POA: Diagnosis not present

## 2024-01-30 DIAGNOSIS — E119 Type 2 diabetes mellitus without complications: Secondary | ICD-10-CM | POA: Diagnosis not present

## 2024-01-30 HISTORY — PX: OPEN REDUCTION INTERNAL FIXATION (ORIF) METACARPAL: SHX6234

## 2024-01-30 SURGERY — OPEN REDUCTION INTERNAL FIXATION (ORIF) METACARPAL
Anesthesia: General | Site: Hand | Laterality: Left

## 2024-01-30 MED ORDER — CEFAZOLIN SODIUM-DEXTROSE 2-4 GM/100ML-% IV SOLN
INTRAVENOUS | Status: AC
  Filled 2024-01-30: qty 100

## 2024-01-30 MED ORDER — CHLORHEXIDINE GLUCONATE 0.12 % MT SOLN
15.0000 mL | Freq: Once | OROMUCOSAL | Status: AC
  Administered 2024-01-30: 15 mL via OROMUCOSAL

## 2024-01-30 MED ORDER — DEXAMETHASONE SODIUM PHOSPHATE 10 MG/ML IJ SOLN
INTRAMUSCULAR | Status: DC | PRN
Start: 2024-01-30 — End: 2024-01-30
  Administered 2024-01-30: 10 mg via INTRAVENOUS

## 2024-01-30 MED ORDER — ACETAMINOPHEN 10 MG/ML IV SOLN
INTRAVENOUS | Status: AC
Start: 2024-01-30 — End: ?
  Filled 2024-01-30: qty 100

## 2024-01-30 MED ORDER — FENTANYL CITRATE (PF) 100 MCG/2ML IJ SOLN
INTRAMUSCULAR | Status: DC | PRN
  Administered 2024-01-30 (×4): 25 ug via INTRAVENOUS

## 2024-01-30 MED ORDER — LIDOCAINE HCL (CARDIAC) PF 100 MG/5ML IV SOSY
PREFILLED_SYRINGE | INTRAVENOUS | Status: DC | PRN
Start: 2024-01-30 — End: 2024-01-30
  Administered 2024-01-30: 80 mg via INTRAVENOUS

## 2024-01-30 MED ORDER — DEXMEDETOMIDINE HCL IN NACL 80 MCG/20ML IV SOLN
INTRAVENOUS | Status: DC | PRN
Start: 2024-01-30 — End: 2024-01-30
  Administered 2024-01-30: 4 ug via INTRAVENOUS
  Administered 2024-01-30: 8 ug via INTRAVENOUS

## 2024-01-30 MED ORDER — EPHEDRINE 5 MG/ML INJ
INTRAVENOUS | Status: AC
  Filled 2024-01-30: qty 5

## 2024-01-30 MED ORDER — ACETAMINOPHEN 10 MG/ML IV SOLN: 1000.0000 mg | Freq: Once | INTRAVENOUS | Status: DC | PRN

## 2024-01-30 MED ORDER — OXYCODONE-ACETAMINOPHEN 5-325 MG PO TABS: 1.0000 | ORAL_TABLET | ORAL | 0 refills | Status: DC | PRN

## 2024-01-30 MED ORDER — LIDOCAINE HCL (PF) 2 % IJ SOLN
INTRAMUSCULAR | Status: AC
  Filled 2024-01-30: qty 5

## 2024-01-30 MED ORDER — PROPOFOL 10 MG/ML IV BOLUS
INTRAVENOUS | Status: DC | PRN
  Administered 2024-01-30: 120 mg via INTRAVENOUS
  Administered 2024-01-30: 50 mg via INTRAVENOUS

## 2024-01-30 MED ORDER — OXYCODONE HCL 5 MG PO TABS
5.0000 mg | ORAL_TABLET | Freq: Once | ORAL | Status: AC | PRN
  Administered 2024-01-30: 5 mg via ORAL

## 2024-01-30 MED ORDER — DEXAMETHASONE SODIUM PHOSPHATE 10 MG/ML IJ SOLN
INTRAMUSCULAR | Status: AC
  Filled 2024-01-30: qty 1

## 2024-01-30 MED ORDER — 0.9 % SODIUM CHLORIDE (POUR BTL) OPTIME
TOPICAL | Status: DC | PRN
  Administered 2024-01-30: 500 mL

## 2024-01-30 MED ORDER — MIDAZOLAM HCL 2 MG/2ML IJ SOLN
INTRAMUSCULAR | Status: AC
  Filled 2024-01-30: qty 2

## 2024-01-30 MED ORDER — CEFAZOLIN SODIUM-DEXTROSE 2-4 GM/100ML-% IV SOLN
2.0000 g | INTRAVENOUS | Status: AC
  Administered 2024-01-30: 2 g via INTRAVENOUS

## 2024-01-30 MED ORDER — MIDAZOLAM HCL 2 MG/2ML IJ SOLN
INTRAMUSCULAR | Status: DC | PRN
Start: 2024-01-30 — End: 2024-01-30
  Administered 2024-01-30: 2 mg via INTRAVENOUS

## 2024-01-30 MED ORDER — OXYCODONE HCL 5 MG/5ML PO SOLN: 5.0000 mg | Freq: Once | ORAL | Status: AC | PRN

## 2024-01-30 MED ORDER — ORAL CARE MOUTH RINSE: 15.0000 mL | Freq: Once | OROMUCOSAL | Status: AC

## 2024-01-30 MED ORDER — EPHEDRINE SULFATE-NACL 50-0.9 MG/10ML-% IV SOSY
PREFILLED_SYRINGE | INTRAVENOUS | Status: DC | PRN
  Administered 2024-01-30 (×2): 10 mg via INTRAVENOUS

## 2024-01-30 MED ORDER — OXYCODONE HCL 5 MG PO TABS
ORAL_TABLET | ORAL | Status: AC
  Filled 2024-01-30: qty 1

## 2024-01-30 MED ORDER — ONDANSETRON HCL 4 MG/2ML IJ SOLN
INTRAMUSCULAR | Status: DC | PRN
  Administered 2024-01-30: 4 mg via INTRAVENOUS

## 2024-01-30 MED ORDER — ONDANSETRON HCL 4 MG/2ML IJ SOLN: 4.0000 mg | Freq: Once | INTRAMUSCULAR | Status: DC | PRN

## 2024-01-30 MED ORDER — LACTATED RINGERS IV SOLN: INTRAVENOUS | Status: DC

## 2024-01-30 MED ORDER — BUPIVACAINE HCL (PF) 0.5 % IJ SOLN
INTRAMUSCULAR | Status: AC
  Filled 2024-01-30: qty 30

## 2024-01-30 MED ORDER — SEVOFLURANE IN SOLN
RESPIRATORY_TRACT | Status: AC
  Filled 2024-01-30: qty 250

## 2024-01-30 MED ORDER — FENTANYL CITRATE (PF) 100 MCG/2ML IJ SOLN
INTRAMUSCULAR | Status: AC
  Filled 2024-01-30: qty 2

## 2024-01-30 MED ORDER — GLYCOPYRROLATE 0.2 MG/ML IJ SOLN
INTRAMUSCULAR | Status: AC
  Filled 2024-01-30: qty 1

## 2024-01-30 MED ORDER — CHLORHEXIDINE GLUCONATE 0.12 % MT SOLN
OROMUCOSAL | Status: AC
  Filled 2024-01-30: qty 15

## 2024-01-30 MED ORDER — ONDANSETRON HCL 4 MG/2ML IJ SOLN
INTRAMUSCULAR | Status: AC
  Filled 2024-01-30: qty 2

## 2024-01-30 MED ORDER — ACETAMINOPHEN 10 MG/ML IV SOLN
INTRAVENOUS | Status: DC | PRN
  Administered 2024-01-30: 1000 mg via INTRAVENOUS

## 2024-01-30 MED ORDER — PROPOFOL 10 MG/ML IV BOLUS
INTRAVENOUS | Status: AC
  Filled 2024-01-30: qty 20

## 2024-01-30 MED ORDER — BUPIVACAINE HCL (PF) 0.5 % IJ SOLN
INTRAMUSCULAR | Status: DC | PRN
  Administered 2024-01-30: 5 mL

## 2024-01-30 MED ORDER — GLYCOPYRROLATE 0.2 MG/ML IJ SOLN
INTRAMUSCULAR | Status: DC | PRN
  Administered 2024-01-30: .2 mg via INTRAVENOUS

## 2024-01-30 MED ORDER — FENTANYL CITRATE (PF) 100 MCG/2ML IJ SOLN
25.0000 ug | INTRAMUSCULAR | Status: DC | PRN
  Administered 2024-01-30 (×2): 25 ug via INTRAVENOUS

## 2024-01-30 SURGICAL SUPPLY — 37 items
BNDG COHESIVE 4X5 TAN STRL LF (GAUZE/BANDAGES/DRESSINGS) ×2 IMPLANT
BNDG ELASTIC 3INX 5YD STR LF (GAUZE/BANDAGES/DRESSINGS) ×2 IMPLANT
BNDG ELASTIC 4INX 5YD STR LF (GAUZE/BANDAGES/DRESSINGS) ×2 IMPLANT
BNDG ESMARCH 4X12 STRL LF (GAUZE/BANDAGES/DRESSINGS) ×2 IMPLANT
BNDG STRETCH GAUZE 3IN X12FT (GAUZE/BANDAGES/DRESSINGS) ×2 IMPLANT
CHLORAPREP W/TINT 26 (MISCELLANEOUS) ×2 IMPLANT
CORD BIP STRL DISP 12FT (MISCELLANEOUS) ×2 IMPLANT
CUFF TOURN SGL QUICK 18X4 (TOURNIQUET CUFF) IMPLANT
DRAPE FLUOR MINI C-ARM 54X84 (DRAPES) ×2 IMPLANT
FORCEPS JEWEL BIP 4-3/4 STR (INSTRUMENTS) ×2 IMPLANT
GAUZE SPONGE 4X4 12PLY STRL (GAUZE/BANDAGES/DRESSINGS) ×2 IMPLANT
GAUZE XEROFORM 1X8 LF (GAUZE/BANDAGES/DRESSINGS) ×2 IMPLANT
GLOVE BIO SURGEON STRL SZ8 (GLOVE) ×4 IMPLANT
GLOVE INDICATOR 8.0 STRL GRN (GLOVE) ×2 IMPLANT
GOWN STRL REUS W/ TWL LRG LVL3 (GOWN DISPOSABLE) ×2 IMPLANT
GOWN STRL REUS W/ TWL XL LVL3 (GOWN DISPOSABLE) ×2 IMPLANT
KIT INNATE INSTRUMENT FOR 3.6 (INSTRUMENTS) IMPLANT
KIT TURNOVER KIT A (KITS) ×2 IMPLANT
MANIFOLD NEPTUNE II (INSTRUMENTS) ×2 IMPLANT
NAIL IM THRD INNATE 3.6.40 (Nail) IMPLANT
NDL HYPO 25X1 1.5 SAFETY (NEEDLE) ×2 IMPLANT
NEEDLE HYPO 25X1 1.5 SAFETY (NEEDLE) ×1 IMPLANT
NS IRRIG 500ML POUR BTL (IV SOLUTION) ×2 IMPLANT
PACK EXTREMITY ARMC (MISCELLANEOUS) ×2 IMPLANT
PAD CAST 3X4 CTTN HI CHSV (CAST SUPPLIES) ×6 IMPLANT
PASSER SUT SWANSON 36MM LOOP (INSTRUMENTS) ×2 IMPLANT
STOCKINETTE 48X4 2 PLY STRL (GAUZE/BANDAGES/DRESSINGS) ×2 IMPLANT
STOCKINETTE IMPERVIOUS 9X36 MD (GAUZE/BANDAGES/DRESSINGS) ×2 IMPLANT
STOCKINETTE STRL 4IN 9604848 (GAUZE/BANDAGES/DRESSINGS) ×1 IMPLANT
STRIP CLOSURE SKIN 1/4X4 (GAUZE/BANDAGES/DRESSINGS) ×2 IMPLANT
SUT PROLENE 4 0 PS 2 18 (SUTURE) ×2 IMPLANT
SUT VIC AB 4-0 SH 27XANBCTRL (SUTURE) ×2 IMPLANT
SWABSTK COMLB BENZOIN TINCTURE (MISCELLANEOUS) ×2 IMPLANT
SYR 10ML LL (SYRINGE) ×2 IMPLANT
TRAP FLUID SMOKE EVACUATOR (MISCELLANEOUS) ×2 IMPLANT
WATER STERILE IRR 500ML POUR (IV SOLUTION) ×2 IMPLANT
WIRE Z .062 C-WIRE SPADE TIP (WIRE) ×4 IMPLANT

## 2024-01-30 NOTE — Anesthesia Procedure Notes (Signed)
Procedure Name: LMA Insertion Date/Time: 01/30/2024 1:05 PM  Performed by: Omer Jack, CRNAPre-anesthesia Checklist: Patient identified, Patient being monitored, Timeout performed, Emergency Drugs available and Suction available Patient Re-evaluated:Patient Re-evaluated prior to induction Oxygen Delivery Method: Circle system utilized Preoxygenation: Pre-oxygenation with 100% oxygen Induction Type: IV induction Ventilation: Mask ventilation without difficulty LMA: LMA inserted LMA Size: 4.0 Tube type: Oral Number of attempts: 1 Placement Confirmation: positive ETCO2 and breath sounds checked- equal and bilateral Tube secured with: Tape Dental Injury: Teeth and Oropharynx as per pre-operative assessment

## 2024-01-30 NOTE — H&P (Signed)
History of the Present Illness: [name] is a 68 y.o. female here today with a left hand fracture. She was found to have a left hand fracture on 01/21/2024 after a fall, displaced fracture distal left fifth metacarpal. X-rays from the hospital are reviewed. They show a complex fracture distal shaft with significant volar displacement of the metacarpal head with shortening and comminution. It is not intra-articular, but does have significant displacement. There is also some soft tissue gas possibly indicating open fracture.   She sustained an open fracture to her left hand following a fall at [facility]. The incident occurred when she was attempting to lift an object off a rack where the wheels were not properly locked. She is right-handed. She has been unable to remove her ring due to swelling. She has not been prescribed any antibiotics for this condition. Her primary care physician is Dr. Ellsworth Lennox.  She has emphysema and smokes 2 packs a day.  I have reviewed past medical, surgical, social and family history, and allergies as documented in the EMR.  Past Medical History: Allergy  Anxiety  Arthritis  CHF exacerbation (CMS/HHS-HCC) 12/28/2019  COPD (chronic obstructive pulmonary disease) (CMS/HHS-HCC)  Diabetes mellitus, type 2 (CMS/HHS-HCC) 01/10/2013  GERD (gastroesophageal reflux disease)  Hypertension  Incontinence  Obesity  Sinusitis, unspecified  Thyroid disease   Past Surgical History: Colon @ [facility] 06/17/2022 (Tubular adenomas/Flex Sig repeat 6 months/CTL)  Back Surgery - 8 infusions  BRONCHOSCOPY  Left Hand Carpal Tunnel Release  REPLACEMENT TOTAL KNEE BILATERAL   Past Family History: Skin cancer Mother  Bronchiolitis Mother  Coronary Artery Disease (Blocked arteries around heart) Father  Hyperlipidemia (Elevated cholesterol) Sister  High blood pressure (Hypertension) Sister  Baby  Diabetes Sister  COPD Sister  Emphysema Brother   Medications: albuterol  (PROVENTIL) 2.5 mg /3 mL (0.083 %) nebulizer solution Take 3 mLs (2.5 mg total) by nebulization as directed for Wheezing Inhale 1 vial via nebulizer every 4-6 hrs as needed. 75 mL 3  albuterol 90 mcg/actuation inhaler Inhale 2 inhalations into the lungs every 6 (six) hours as needed for Wheezing 3 each 1  budesonide-formoteroL (SYMBICORT) 160-4.5 mcg/actuation inhaler Inhale 2 inhalations into the lungs 2 (two) times daily 30.6 g 1  busPIRone (BUSPAR) 30 MG tablet Take 30 mg by mouth 2 (two) times daily  ciclopirox (PENLAC) 8 % topical nail solution Apply topically at bedtime Apply over nail and surrounding skin. 6.6 mL 2  cloNIDine HCL (CATAPRES) 0.2 MG tablet Take 0.2 mg by mouth 2 (two) times daily  dilTIAZem (CARDIZEM CD) 180 MG CD capsule Take 1 capsule (180 mg total) by mouth once daily 30 capsule 11  escitalopram oxalate (LEXAPRO) 20 MG tablet  famotidine (PEPCID) 40 MG tablet  fenofibrate micronized (LOFIBRA) 134 MG capsule TAKE 1 CAPSULE BY MOUTH DAILY WITH A MEAL  gabapentin (NEURONTIN) 800 MG tablet Take by mouth  L. acidophilus/Bifid. animalis 32 billion cell Cap Take 1 capsule by mouth once daily  levothyroxine (SYNTHROID) 25 MCG tablet  minoxidiL 2.5 MG tablet Take 7.5 mg by mouth once daily  olmesartan (BENICAR) 40 MG tablet Take 1 tablet by mouth once daily  oxyCODONE-acetaminophen (PERCOCET) 5-325 mg tablet Take 1 tablet by mouth every 4 (four) hours as needed  pantoprazole (PROTONIX) 40 MG DR tablet  pramipexole (MIRAPEX) 0.5 MG tablet Take 0.5 mg by mouth at bedtime  tiotropium bromide (SPIRIVA RESPIMAT) 2.5 mcg/actuation inhalation spray Inhale 2 inhalations (5 mcg total) into the lungs once daily 4 g 11  urea (CEROVEL) 40 % topical cream Apply topically 2 (two) times daily Apply to thickened nails 2x daily 90 g 2  oxyCODONE (ROXICODONE) 5 MG immediate release tablet Take 1 tablet (5 mg total) by mouth every 6 (six) hours as needed for Pain 25 tablet 0   No current  facility-administered medications for this visit.   Allergies: Acetaminophen Other (Kidney issues) Aspirin Other (Dr. York Spaniel do not take due to kidneys) Nsaids (Pt states it messes up her kidneys)  Hydrocodone-Acetaminophen Nausea And Vomiting, Itching  [place]-Hydran Other (See Comments)  Thiazides Other (See Comments)  Other reaction(s): Unknown   Review of Systems: A comprehensive 14 point ROS was performed, reviewed, and the pertinent orthopaedic findings are documented in the HPI.  Physical Examination:  Vitals:  01/26/24 1100  BP: (!) 148/88 Body mass index is 29.32 kg/m.  General/Constitutional: No apparent distress: well-nourished and well developed. Eyes: Pupils equal, round with synchronous movement. Lungs: Clear to auscultation HEENT: Normal Vascular: No edema, swelling or tenderness, except as noted in detailed exam. Cardiac: Heart rate and rhythm is regular. Integumentary: No impressive skin lesions present, except as noted in detailed exam. Neuro/Psych: Normal mood and affect, oriented to person, place, and time.  Musculoskeletal Exam: There are 2 skin tears on the back of the hand, one measuring 2 cm and the other 3 cm, but they do not appear to communicate with the fracture site.  Radiographs: X-rays from the hospital are reviewed. They show a complex fracture distal shaft with significant volar displacement of the metacarpal head with shortening and comminution. It is not intra-articular, but does have significant displacement.  Assessment: 1. Closed displaced fracture of shaft of fifth metacarpal bone of left hand, initial encounter S62.327A   Plan: The treatment options, including both surgical and nonsurgical choices, have been discussed in detail with the patient. The patient would like to proceed with surgical intervention to include a reduction and internal fixation of her displaced left fifth metacarpal fracture. The risks (including  bleeding, infection, nerve and/or blood vessel injury, persistent or recurrent pain, loosening or failure of the components, leg length inequality, dislocation, need for further surgery, blood clots, strokes, heart attacks or arrhythmias, pneumonia, etc.) and benefits of the surgical procedure were discussed. The patient states his/her understanding and agrees to proceed. A formal written consent will be obtained by the nursing staff.    H&P reviewed and patient re-examined. No changes.

## 2024-01-30 NOTE — Anesthesia Preprocedure Evaluation (Signed)
Anesthesia Evaluation  Patient identified by MRN, date of birth, ID band Patient awake    Reviewed: Allergy & Precautions, NPO status , Patient's Chart, lab work & pertinent test results  History of Anesthesia Complications Negative for: history of anesthetic complications  Airway Mallampati: II  TM Distance: >3 FB Neck ROM: Full    Dental  (+) Upper Dentures, Edentulous Lower   Pulmonary shortness of breath, sleep apnea and Continuous Positive Airway Pressure Ventilation , COPD,  COPD inhaler and oxygen dependent, Current Smoker and Patient abstained from smoking.    + decreased breath sounds      Cardiovascular Exercise Tolerance: Good METShypertension, Pt. on medications pulmonary hypertension+ Peripheral Vascular Disease and +CHF  (-) CAD and (-) Past MI (-) dysrhythmias  Rhythm:Regular Rate:Normal - Systolic murmurs TTE 2021 1. Left ventricular ejection fraction, by estimation, is 60 to 65%. The  left ventricle has normal function. The left ventricle has no regional  wall motion abnormalities. Left ventricular diastolic parameters are  consistent with Grade II diastolic  dysfunction (pseudonormalization). Elevated left atrial pressure.   2. Right ventricular systolic function is normal. The right ventricular  size is normal. There is mildly elevated pulmonary artery systolic  pressure.   3. Left atrial size was severely dilated.   4. Right atrial size was severely dilated.   5. The mitral valve is normal in structure. Mild mitral valve  regurgitation.   6. The aortic valve is normal in structure. Aortic valve regurgitation is  not visualized. Mild aortic valve sclerosis is present, with no evidence  of aortic valve stenosis.   7. The inferior vena cava is dilated in size with <50% respiratory  variability, suggesting right atrial pressure of 15 mmHg.     Neuro/Psych  PSYCHIATRIC DISORDERS Anxiety Depression    negative  neurological ROS     GI/Hepatic ,GERD  Controlled,,(+)     (-) substance abuse    Endo/Other  diabetesHypothyroidism    Renal/GU negative Renal ROS     Musculoskeletal   Abdominal   Peds  Hematology   Anesthesia Other Findings Past Medical History: No date: (HFpEF) heart failure with preserved ejection fraction (HCC) 03/29/2011: Acute renal failure (HCC) 11/2020: Acute respiratory distress 04/14/2014: Acute respiratory failure (HCC) No date: Anxiety No date: Arthritis No date: CHF (congestive heart failure) (HCC) No date: Chronic respiratory failure with hypoxia and hypercapnia  (HCC) 01/10/2013: Community acquired pneumonia No date: COPD (chronic obstructive pulmonary disease) (HCC)     Comment:  no inhalers--smoker, no oxygen No date: Depression No date: Dyspnea No date: Emphysema lung (HCC) No date: GERD (gastroesophageal reflux disease) No date: Hyperlipidemia No date: Hypertension No date: Hypothyroid No date: Peripheral vascular disease (HCC) No date: Pre-diabetes No date: Restless leg syndrome No date: Seizures (HCC)     Comment:  02/13/2011 -hx of seizure due to "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  no seizure since--pt does take               keppra and office note from neurologist dr. Marjory Lies on               this chart No  date: Sepsis due to pneumonia (HCC) No date: Sleep apnea No date: Urinary incontinence  Reproductive/Obstetrics                             Anesthesia Physical Anesthesia Plan  ASA: 3  Anesthesia Plan: General   Post-op Pain Management: Ofirmev IV (intra-op)*   Induction: Intravenous  PONV Risk Score and Plan: 3 and Ondansetron, Dexamethasone, Midazolam and Treatment may vary due to age or medical condition  Airway Management Planned:  LMA  Additional Equipment: None  Intra-op Plan:   Post-operative Plan: Extubation in OR  Informed Consent: I have reviewed the patients History and Physical, chart, labs and discussed the procedure including the risks, benefits and alternatives for the proposed anesthesia with the patient or authorized representative who has indicated his/her understanding and acceptance.     Dental advisory given  Plan Discussed with: CRNA and Surgeon  Anesthesia Plan Comments: (Discussed risks of anesthesia with patient, including PONV, sore throat, lip/dental/eye damage. Rare risks discussed as well, such as cardiorespiratory and neurological sequelae, and allergic reactions. Discussed the role of CRNA in patient's perioperative care. Patient understands. Patient counseled on benefits of smoking cessation, and increased perioperative risks associated with continued smoking. )       Anesthesia Quick Evaluation

## 2024-01-30 NOTE — Op Note (Signed)
Patient:   [name]  01/30/2024  5:19 PM  Patient:   [name]  Pre-Op Diagnosis:   Closed displaced distal third left fifth metacarpal shaft fracture.  Post-Op Diagnosis:   Same  Procedure:   Closed reduction and intramedullary fixation of displaced distal third left fifth metacarpal shaft fracture.  Surgeon:   Maryagnes Amos, MD  Assistant:   None  Anesthesia:   General LMA  Findings:   As above.  Complications:   None  Fluids:   300 cc crystalloid  EBL:   5 cc  UOP:   None  TT:   None  Drains:   None  Closure:   4-0 Prolene interrupted sutures  Implants:   Exsomed 3.6 x 40 mm intramedullary screw  Brief Clinical Note:   The patient is a 68 year old female who sustained the above-noted injury 9 days ago when she apparently lost her balance and fell while trying to get a cart at the [employer]. She was brought to the emergency room where x-rays demonstrated the above-noted injury. She was advised to follow-up with orthopedics and presents at this time for definitive management of this injury.  Procedure:   The patient was brought into the operating room and lain in the supine position. After adequate general endotracheal intubation and anesthesia were obtained, the patient's left hand and upper extremity were prepped with ChloraPrep solution before being draped sterilely. Preoperative antibiotics were administered. A timeout was performed to verify the appropriate surgical site.   A 1 to 1.5 cm incision was made longitudinally over the dorsal aspect of the left little MCP joint. The incision was carried down through the subcutaneous tissues using blunt dissection to expose the extensor tendon. A short longitudinal stab incision was made through the extensor tendon to expose the metacarpal head.   Under FluoroScan imaging in AP and lateral projections, a guidewire was passed into the distal fragment. The fracture was reduced and, after several  attempts, the wire advanced across the fracture to engage the intramedullary portion of the proximal fragment. The pin was advanced to engage the cortex at the proximal end of the metacarpal. Again, the adequacy of pin position and fracture reduction was verified using FluoroScan imaging in AP and lateral projections and found to be excellent. The length of the guidewire was measured and found to be 45 mm. Therefore, a 40 mm screw was selected and, after over drilling the guidewire with a cannulated drill, the 3.6 x 40 mm screw was advanced from distal to proximal over the guidewire and across the fracture site to engage the proximal fragment. The adequacy of screw position and fracture reduction was verified using FluoroScan imaging in AP, lateral, and oblique projections and found to be excellent.  The wound was copiously irrigated with sterile saline solution before being closed using 4-0 Prolene interrupted sutures. A total of 5 cc of 0.5% plain Sensorcaine was injected and around the incision site and fracture site to help with postoperative analgesia before sterile bulky dressing was applied to the hand. The patient was placed into an ulnar gutter splint maintaining the wrist in slight extension and the ring and little PIP joints in approximately 50 degrees of flexion. The ring and little fingers also were buddy taped to help control rotation.  The patient was then awakened, extubated, and returned to the recovery room in satisfactory condition after tolerating the procedure well.

## 2024-01-30 NOTE — Discharge Instructions (Addendum)
Orthopedic discharge instructions: Keep splint dry and intact. Keep hand elevated above heart level. Apply ice to affected area frequently. Take pain medication as prescribed or ES Tylenol when needed.  Return for follow-up in 10-14 days or as scheduled.

## 2024-01-30 NOTE — Transfer of Care (Signed)
Immediate Anesthesia Transfer of Care Note  Patient: NUR RABOLD  Procedure(s) Performed: OPEN REDUCTION INTERNAL FIXATION (ORIF) OF LEFT 5TH METACARPAL SHAFT FRACTURE. (Left: Hand)  Patient Location: PACU  Anesthesia Type:General  Level of Consciousness: drowsy and patient cooperative  Airway & Oxygen Therapy: Patient Spontanous Breathing and Patient connected to nasal cannula oxygen  Post-op Assessment: Report given to RN and Post -op Vital signs reviewed and stable  Post vital signs: Reviewed and stable  Last Vitals:  Vitals Value Taken Time  BP 164/74 01/30/24 1517  Temp 36.5 C 01/30/24 1517  Pulse 93 01/30/24 1517  Resp 18 01/30/24 1517  SpO2 99 % 01/30/24 1517    Last Pain:  Vitals:   01/30/24 1517  TempSrc: Temporal  PainSc: 0-No pain      Patients Stated Pain Goal: 3 (01/30/24 1445)  Complications: No notable events documented.

## 2024-01-31 ENCOUNTER — Other Ambulatory Visit: Payer: Self-pay

## 2024-01-31 ENCOUNTER — Encounter: Payer: Self-pay | Admitting: Surgery

## 2024-01-31 DIAGNOSIS — K219 Gastro-esophageal reflux disease without esophagitis: Secondary | ICD-10-CM

## 2024-01-31 MED ORDER — PANTOPRAZOLE SODIUM 40 MG PO TBEC: 40.0000 mg | DELAYED_RELEASE_TABLET | Freq: Every morning | ORAL | 2 refills | Status: DC

## 2024-02-03 ENCOUNTER — Ambulatory Visit: Payer: 59 | Admitting: Internal Medicine

## 2024-02-03 VITALS — BP 190/88 | HR 84 | Temp 98.4°F | Ht 66.0 in | Wt 178.0 lb

## 2024-02-03 DIAGNOSIS — J301 Allergic rhinitis due to pollen: Secondary | ICD-10-CM | POA: Diagnosis not present

## 2024-02-03 DIAGNOSIS — S62327D Displaced fracture of shaft of fifth metacarpal bone, left hand, subsequent encounter for fracture with routine healing: Secondary | ICD-10-CM | POA: Diagnosis not present

## 2024-02-03 DIAGNOSIS — S6292XD Unspecified fracture of left wrist and hand, subsequent encounter for fracture with routine healing: Secondary | ICD-10-CM

## 2024-02-03 DIAGNOSIS — M47816 Spondylosis without myelopathy or radiculopathy, lumbar region: Secondary | ICD-10-CM | POA: Diagnosis not present

## 2024-02-03 DIAGNOSIS — R5381 Other malaise: Secondary | ICD-10-CM | POA: Diagnosis not present

## 2024-02-03 DIAGNOSIS — F3289 Other specified depressive episodes: Secondary | ICD-10-CM

## 2024-02-03 DIAGNOSIS — I10 Essential (primary) hypertension: Secondary | ICD-10-CM | POA: Diagnosis not present

## 2024-02-03 MED ORDER — MINOXIDIL 2.5 MG PO TABS: 7.5000 mg | ORAL_TABLET | Freq: Every day | ORAL | 2 refills | Status: DC

## 2024-02-03 MED ORDER — FLUTICASONE PROPIONATE 50 MCG/ACT NA SUSP: 1.0000 | Freq: Every day | NASAL | 2 refills | Status: DC

## 2024-02-03 MED ORDER — ESCITALOPRAM OXALATE 20 MG PO TABS
20.0000 mg | ORAL_TABLET | Freq: Every day | ORAL | 3 refills | Status: DC
Start: 2024-02-03 — End: 2024-05-17

## 2024-02-03 MED ORDER — GABAPENTIN 800 MG PO TABS: 800.0000 mg | ORAL_TABLET | Freq: Three times a day (TID) | ORAL | 0 refills | Status: DC

## 2024-02-03 NOTE — Progress Notes (Signed)
Established Patient Office Visit  Subjective:  Patient ID: [name], female    DOB: [birth year]  Age: 68 y.o. MRN: [id number]  Chief Complaint  Patient presents with   Hospitalization Follow-up    Hospital Follow Up    Broke her left wrist after an accidental fall at a [employer], also here for lab review and medication refills.   No other concerns at this time.   Past Medical History:  Diagnosis Date   (HFpEF) heart failure with preserved ejection fraction (HCC)    Acute renal failure (HCC) 03/29/2011   Acute respiratory distress 11/2020   Acute respiratory failure (HCC) 04/14/2014   Anxiety    Arthritis    CHF (congestive heart failure) (HCC)    Chronic respiratory failure with hypoxia and hypercapnia (HCC)    Community acquired pneumonia 01/10/2013   COPD (chronic obstructive pulmonary disease) (HCC)    no inhalers--smoker, no oxygen   Depression    Dyspnea    Emphysema lung (HCC)    GERD (gastroesophageal reflux disease)    Hyperlipidemia    Hypertension    Hypothyroid    Peripheral vascular disease (HCC)    Pre-diabetes    Restless leg syndrome    Seizures (HCC)    02/13/2011 -hx of seizure due to "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  no seizure since--pt does take keppra and office note from neurologist dr. Marjory Lies on this chart   Sepsis due to pneumonia [facility])    Sleep apnea    Urinary incontinence     Past Surgical History:  Procedure Laterality Date   CARPAL TUNNEL RELEASE     left   COLONOSCOPY WITH PROPOFOL N/A 06/17/2022   Procedure: COLONOSCOPY WITH PROPOFOL;  Surgeon: Regis Bill, MD;  Location: [facility];  Service: Endoscopy;  Laterality: N/A;   JOINT REPLACEMENT  08/26/2011   right total knee arthroplasty, left total knee   KNEE ARTHROPLASTY  10/28/2011   Procedure: COMPUTER ASSISTED TOTAL KNEE ARTHROPLASTY;  Surgeon: Carlisle Beers Rendall III;  Location: [facility];  Service: Orthopedics;  Laterality: Left;  preop femoral nerve block   KNEE SURGERY     right   laminectomy and diskectomy     L4-5 with fusion   OPEN REDUCTION INTERNAL FIXATION (ORIF) METACARPAL Left 01/30/2024   Procedure: OPEN REDUCTION INTERNAL FIXATION (ORIF) OF LEFT 5TH METACARPAL SHAFT FRACTURE.;  Surgeon: Christena Flake, MD;  Location: [facility];  Service: Orthopedics;  Laterality: Left;   PROCTOSCOPY N/A 06/18/2022   Procedure: RIGID PROCTOSCOPY;  Surgeon: Karie Soda, MD;  Location: [facility];  Service: General;  Laterality: N/A;    Social History   Socioeconomic History   Marital status: Widowed    Spouse name: Not on file   Number of children: Not on file   Years of education: Not on file   Highest education level: Not on file  Occupational History   Not on file  Tobacco Use   Smoking status: Every Day    Current packs/day: 2.00    Average packs/day: 2.0 packs/day for 50.0 years (100.0 ttl pk-yrs)    Types: Cigarettes    Passive exposure: Current   Smokeless tobacco: Never  Vaping Use   Vaping status: Every Day   Substances: Nicotine, Flavoring  Substance and Sexual Activity   Alcohol use: Yes    Comment: occasional   Drug use: No  Sexual activity: Yes    Birth control/protection: Post-menopausal  Other Topics Concern   Not on file  Social History Narrative   ** Merged History Encounter **       Social Drivers of Health   Financial Resource Strain: Low Risk  (01/26/2024)   Received from [facility]   Overall Financial Resource Strain (CARDIA)    Difficulty of Paying Living Expenses: Not hard at all  Food Insecurity: No Food Insecurity (01/26/2024)   Received from [facility]   Hunger Vital Sign    Worried About Running Out of Food in the Last Year: Never true    Ran Out of Food in the Last Year: Never true  Transportation Needs: No Transportation Needs (01/26/2024)   Received from [facility] - Transportation    In the past 12 months, has lack of transportation kept you from medical appointments or from getting medications?: No    Lack of Transportation (Non-Medical): No  Recent Concern: Transportation Needs - Unmet Transportation Needs (01/25/2024)   Received from [facility] - Transportation    In the past 12 months, has lack of transportation kept you from medical appointments or from getting medications?: Yes    Lack of Transportation (Non-Medical): Yes  Physical Activity: Not on file  Stress: Not on file  Social Connections: Not on file  Intimate Partner Violence: Not At Risk (04/27/2023)   Humiliation, Afraid, Rape, and Kick questionnaire    Fear of Current or Ex-Partner: No    Emotionally Abused: No    Physically Abused: No    Sexually Abused: No    Family History  Problem Relation Age of Onset   Heart attack Father 55   Alzheimer'Molley Houser disease Mother    Breast cancer Neg Hx     Allergies  Allergen Reactions   Nsaids Other (See Comments)    Pt states it messes up her kidneys But takes aspirin & ibuprofen   Thiazide-Type Diuretics     Other reaction(Jacqueline Delapena): Unknown   Hydrocodone Itching    Outpatient Medications Prior to Visit  Medication Sig   acidophilus (RISAQUAD) CAPS capsule Take 1 capsule by mouth daily.   albuterol (VENTOLIN HFA) 108 (90 Base) MCG/ACT inhaler Inhale 2 puffs into the lungs every 4 (four) hours as needed for wheezing or shortness of breath.   budesonide-formoterol (SYMBICORT) 160-4.5 MCG/ACT inhaler Inhale 2 puffs into the lungs 2 (two) times daily.   busPIRone (BUSPAR) 30 MG tablet Take 1 tablet (30 mg total) by mouth 2 (two) times daily.   cloNIDine (CATAPRES) 0.2 MG tablet TAKE 1 TABLET BY MOUTH 2 TIMES DAILY.   diltiazem (CARDIZEM CD) 180 MG 24 hr capsule Take 180 mg by mouth daily.   famotidine (PEPCID) 40 MG tablet Take 40 mg by mouth at bedtime.   fenofibrate micronized  (LOFIBRA) 134 MG capsule TAKE 1 CAPSULE(134 MG) BY MOUTH DAILY BEFORE BREAKFAST   hydrALAZINE (APRESOLINE) 10 MG tablet Take 1 tablet (10 mg total) by mouth 3 (three) times daily as needed. Please take if your blood pressure is above 150/90. You can repeat the dose as needed up with up to 3 pills per day. If your blood pressure is not improving with this, please call your doctor'Nicloe Frontera office.   levothyroxine (SYNTHROID) 25 MCG tablet TAKE 1 TABLET BY MOUTH DAILY BEFORE BREAKFAST.   oxyCODONE-acetaminophen (PERCOCET) 5-325 MG tablet Take 1 tablet by mouth every 4 (four) hours  as needed for moderate pain (pain score 4-6) or severe pain (pain score 7-10).   OXYGEN Inhale 3 L into the lungs at bedtime.   pantoprazole (PROTONIX) 40 MG tablet Take 1 tablet (40 mg total) by mouth every morning.   pramipexole (MIRAPEX) 0.5 MG tablet TAKE 1 TABLET BY MOUTH AT BEDTIME.   SPIRIVA HANDIHALER 18 MCG inhalation capsule 1 capsule daily.   trimethoprim (TRIMPEX) 100 MG tablet Take 1 tablet (100 mg total) by mouth daily.   [DISCONTINUED] escitalopram (LEXAPRO) 20 MG tablet Take 1 tablet (20 mg total) by mouth daily.   [DISCONTINUED] fluticasone (FLONASE) 50 MCG/ACT nasal spray Place 1 spray into both nostrils daily.   [DISCONTINUED] gabapentin (NEURONTIN) 800 MG tablet Take 1 tablet (800 mg total) by mouth 3 (three) times daily.   [DISCONTINUED] minoxidil (LONITEN) 2.5 MG tablet Take 3 tablets (7.5 mg total) by mouth daily.   Facility-Administered Medications Prior to Visit  Medication Dose Route Frequency Provider   chlorhexidine (HIBICLENS) 4 % liquid 4 application  60 mL Topical Once Duffy, Lucienne Minks, PA-C   chlorhexidine (HIBICLENS) 4 % liquid 4 application  60 mL Topical Once Duffy, Lucienne Minks, PA-C    Review of Systems  Constitutional: Negative.   HENT: Negative.    Eyes: Negative.   Respiratory:  Positive for shortness of breath (on exertion).   Cardiovascular: Negative.   Gastrointestinal:  Positive for  constipation.  Genitourinary: Negative.   Musculoskeletal:  Positive for back pain and falls.  Skin: Negative.   Neurological:  Positive for headaches (chronic).  Endo/Heme/Allergies: Negative.   Psychiatric/Behavioral: Negative.         Objective:   BP (!) 190/88   Pulse 84   Temp 98.4 F (36.9 C) (Tympanic)   Ht 5\' 6"  (1.676 m)   Wt 178 lb (80.7 kg)   SpO2 90%   BMI 28.73 kg/m   Vitals:   02/03/24 1532  BP: (!) 190/88  Pulse: 84  Temp: 98.4 F (36.9 C)  Height: 5\' 6"  (1.676 m)  Weight: 178 lb (80.7 kg)  SpO2: 90%  TempSrc: Tympanic  BMI (Calculated): 28.74    Physical Exam Neck:     Comments: Left maxillary sinus tenderness Cardiovascular:     Rate and Rhythm: Normal rate and regular rhythm.  Abdominal:     Palpations: Abdomen is soft.  Musculoskeletal:     Left forearm: Swelling present.     Comments: Left forearm and wrist in a cast.  Lymphadenopathy:     Cervical: Cervical adenopathy present.     Right cervical: Superficial cervical adenopathy (submandibular enlarged and tender) present.  Skin:    General: Skin is warm and dry.  Neurological:     General: No focal deficit present.      No results found for any visits on 02/03/24.  Recent Results (from the past 2160 hours)  POCT XPERT XPRESS SARS COVID-2/FLU/RSV [[id number]]     Status: None   Collection Time: 11/28/23  4:38 PM  Result Value Ref Range   SARS Coronavirus 2 neg    FLU A neg    FLU B neg    RSV RNA, PCR neg       Assessment & Plan:   Problem List Items Addressed This Visit       Cardiovascular and Mediastinum   Hypertension   Relevant Medications   minoxidil (LONITEN) 2.5 MG tablet     Musculoskeletal and Integument   Lumbar spondylosis   Relevant Medications   gabapentin (NEURONTIN)  800 MG tablet   Other Visit Diagnoses       Physical debility    -  Primary   Relevant Orders   Ambulatory referral to Home Health     Seasonal allergic rhinitis due to pollen        Relevant Medications   fluticasone (FLONASE) 50 MCG/ACT nasal spray     Fractured hand, left, with routine healing, subsequent encounter       Relevant Orders   Ambulatory referral to Home Health     Other depression       Relevant Medications   escitalopram (LEXAPRO) 20 MG tablet       Return if symptoms worsen or fail to improve. Keep appt.   Total time spent: 20 minutes  Luna Fuse, MD  02/03/2024   This document may have been prepared by [facility] Voice Recognition software and as such may include unintentional dictation errors.

## 2024-02-06 NOTE — Anesthesia Postprocedure Evaluation (Signed)
 Anesthesia Post Note  Patient: [name]  Procedure(s) Performed: OPEN REDUCTION INTERNAL FIXATION (ORIF) OF LEFT 5TH METACARPAL SHAFT FRACTURE. (Left: Hand)  Patient location during evaluation: PACU Anesthesia Type: General Level of consciousness: awake and alert Pain management: pain level controlled Vital Signs Assessment: post-procedure vital signs reviewed and stable Respiratory status: spontaneous breathing, nonlabored ventilation, respiratory function stable and patient connected to nasal cannula oxygen Cardiovascular status: blood pressure returned to baseline and stable Postop Assessment: no apparent nausea or vomiting Anesthetic complications: no   No notable events documented.   Last Vitals:  Vitals:   01/30/24 1500 01/30/24 1517  BP: (!) 160/81 (!) 164/74  Pulse: 79 93  Resp: 17 18  Temp: (!) 36.2 C 36.5 C  SpO2: 95% 99%    Last Pain:  Vitals:   01/30/24 1517  TempSrc: Temporal  PainSc: 0-No pain                 Corinda Gubler

## 2024-02-10 DIAGNOSIS — Z8781 Personal history of (healed) traumatic fracture: Secondary | ICD-10-CM | POA: Diagnosis not present

## 2024-02-10 DIAGNOSIS — S82832A Other fracture of upper and lower end of left fibula, initial encounter for closed fracture: Secondary | ICD-10-CM | POA: Diagnosis not present

## 2024-02-10 DIAGNOSIS — S81812A Laceration without foreign body, left lower leg, initial encounter: Secondary | ICD-10-CM | POA: Diagnosis not present

## 2024-02-10 DIAGNOSIS — M25572 Pain in left ankle and joints of left foot: Secondary | ICD-10-CM | POA: Diagnosis not present

## 2024-02-10 DIAGNOSIS — Z9889 Other specified postprocedural states: Secondary | ICD-10-CM | POA: Diagnosis not present

## 2024-02-10 DIAGNOSIS — S62327D Displaced fracture of shaft of fifth metacarpal bone, left hand, subsequent encounter for fracture with routine healing: Secondary | ICD-10-CM | POA: Diagnosis not present

## 2024-02-10 DIAGNOSIS — G8929 Other chronic pain: Secondary | ICD-10-CM | POA: Diagnosis not present

## 2024-02-11 DIAGNOSIS — G4733 Obstructive sleep apnea (adult) (pediatric): Secondary | ICD-10-CM | POA: Diagnosis not present

## 2024-02-13 ENCOUNTER — Other Ambulatory Visit: Payer: Self-pay | Admitting: Internal Medicine

## 2024-02-13 ENCOUNTER — Ambulatory Visit: Payer: 59 | Admitting: Internal Medicine

## 2024-02-13 DIAGNOSIS — I509 Heart failure, unspecified: Secondary | ICD-10-CM | POA: Diagnosis not present

## 2024-02-13 DIAGNOSIS — G4733 Obstructive sleep apnea (adult) (pediatric): Secondary | ICD-10-CM | POA: Diagnosis not present

## 2024-02-13 DIAGNOSIS — J9621 Acute and chronic respiratory failure with hypoxia: Secondary | ICD-10-CM | POA: Diagnosis not present

## 2024-02-13 DIAGNOSIS — J441 Chronic obstructive pulmonary disease with (acute) exacerbation: Secondary | ICD-10-CM | POA: Diagnosis not present

## 2024-02-14 DIAGNOSIS — R2689 Other abnormalities of gait and mobility: Secondary | ICD-10-CM | POA: Diagnosis not present

## 2024-02-15 DIAGNOSIS — R2689 Other abnormalities of gait and mobility: Secondary | ICD-10-CM | POA: Diagnosis not present

## 2024-02-17 DIAGNOSIS — J449 Chronic obstructive pulmonary disease, unspecified: Secondary | ICD-10-CM | POA: Diagnosis not present

## 2024-02-21 ENCOUNTER — Encounter: Payer: Self-pay | Admitting: Internal Medicine

## 2024-02-21 DIAGNOSIS — Z9889 Other specified postprocedural states: Secondary | ICD-10-CM | POA: Diagnosis not present

## 2024-02-21 DIAGNOSIS — G8929 Other chronic pain: Secondary | ICD-10-CM | POA: Diagnosis not present

## 2024-02-21 DIAGNOSIS — M25572 Pain in left ankle and joints of left foot: Secondary | ICD-10-CM | POA: Diagnosis not present

## 2024-02-21 DIAGNOSIS — S81812A Laceration without foreign body, left lower leg, initial encounter: Secondary | ICD-10-CM | POA: Diagnosis not present

## 2024-02-21 DIAGNOSIS — S82832A Other fracture of upper and lower end of left fibula, initial encounter for closed fracture: Secondary | ICD-10-CM | POA: Diagnosis not present

## 2024-02-21 DIAGNOSIS — Z8781 Personal history of (healed) traumatic fracture: Secondary | ICD-10-CM | POA: Diagnosis not present

## 2024-02-23 ENCOUNTER — Other Ambulatory Visit: Payer: Self-pay | Admitting: Acute Care

## 2024-02-23 DIAGNOSIS — Z87891 Personal history of nicotine dependence: Secondary | ICD-10-CM

## 2024-02-23 DIAGNOSIS — F17210 Nicotine dependence, cigarettes, uncomplicated: Secondary | ICD-10-CM

## 2024-02-23 DIAGNOSIS — Z122 Encounter for screening for malignant neoplasm of respiratory organs: Secondary | ICD-10-CM

## 2024-03-01 ENCOUNTER — Ambulatory Visit: Admitting: Physician Assistant

## 2024-03-02 ENCOUNTER — Ambulatory Visit

## 2024-03-06 ENCOUNTER — Ambulatory Visit
Admission: RE | Admit: 2024-03-06 | Discharge: 2024-03-06 | Disposition: A | Discharge status: Home / Self Care | Source: Ambulatory Visit | Attending: Acute Care | Admitting: Acute Care

## 2024-03-06 DIAGNOSIS — F17210 Nicotine dependence, cigarettes, uncomplicated: Secondary | ICD-10-CM | POA: Diagnosis not present

## 2024-03-06 DIAGNOSIS — Z87891 Personal history of nicotine dependence: Secondary | ICD-10-CM | POA: Diagnosis not present

## 2024-03-06 DIAGNOSIS — Z122 Encounter for screening for malignant neoplasm of respiratory organs: Secondary | ICD-10-CM | POA: Insufficient documentation

## 2024-03-08 ENCOUNTER — Ambulatory Visit (INDEPENDENT_AMBULATORY_CARE_PROVIDER_SITE_OTHER): Admitting: Internal Medicine

## 2024-03-08 ENCOUNTER — Telehealth: Payer: Self-pay

## 2024-03-08 ENCOUNTER — Other Ambulatory Visit: Payer: Self-pay | Admitting: Internal Medicine

## 2024-03-08 ENCOUNTER — Encounter: Payer: Self-pay | Admitting: Internal Medicine

## 2024-03-08 ENCOUNTER — Ambulatory Visit

## 2024-03-08 DIAGNOSIS — N390 Urinary tract infection, site not specified: Secondary | ICD-10-CM | POA: Diagnosis not present

## 2024-03-08 DIAGNOSIS — G4733 Obstructive sleep apnea (adult) (pediatric): Secondary | ICD-10-CM | POA: Diagnosis not present

## 2024-03-08 LAB — POCT URINALYSIS DIPSTICK
Bilirubin, UA: NEGATIVE
Glucose, UA: NEGATIVE
Ketones, UA: NEGATIVE
Nitrite, UA: POSITIVE
Protein, UA: NEGATIVE
Spec Grav, UA: 1.01 (ref 1.010–1.025)
Urobilinogen, UA: 0.2 U/dL — AB
pH, UA: 6 (ref 5.0–8.0)

## 2024-03-08 MED ORDER — CIPROFLOXACIN HCL 500 MG PO TABS
500.0000 mg | ORAL_TABLET | Freq: Two times a day (BID) | ORAL | 0 refills | Status: AC
Start: 2024-03-08 — End: 2024-03-15

## 2024-03-08 NOTE — Progress Notes (Signed)
 Cipro for UTI Send for c/s.

## 2024-03-08 NOTE — Telephone Encounter (Signed)
 Patient called stating that her urine is cloudy and she's going a lot, can you call her and put her on for a UA only Nurse visit

## 2024-03-09 DIAGNOSIS — R2689 Other abnormalities of gait and mobility: Secondary | ICD-10-CM | POA: Diagnosis not present

## 2024-03-09 DIAGNOSIS — Z8781 Personal history of (healed) traumatic fracture: Secondary | ICD-10-CM | POA: Diagnosis not present

## 2024-03-09 DIAGNOSIS — S82832A Other fracture of upper and lower end of left fibula, initial encounter for closed fracture: Secondary | ICD-10-CM | POA: Diagnosis not present

## 2024-03-12 DIAGNOSIS — S62327D Displaced fracture of shaft of fifth metacarpal bone, left hand, subsequent encounter for fracture with routine healing: Secondary | ICD-10-CM | POA: Diagnosis not present

## 2024-03-12 LAB — URINE CULTURE

## 2024-03-13 DIAGNOSIS — K859 Acute pancreatitis without necrosis or infection, unspecified: Secondary | ICD-10-CM

## 2024-03-13 DIAGNOSIS — R2689 Other abnormalities of gait and mobility: Secondary | ICD-10-CM | POA: Diagnosis not present

## 2024-03-13 HISTORY — DX: Acute pancreatitis without necrosis or infection, unspecified: K85.90

## 2024-03-19 DIAGNOSIS — J449 Chronic obstructive pulmonary disease, unspecified: Secondary | ICD-10-CM | POA: Diagnosis not present

## 2024-03-21 ENCOUNTER — Other Ambulatory Visit: Payer: Self-pay | Admitting: Surgery

## 2024-03-21 ENCOUNTER — Other Ambulatory Visit: Payer: Self-pay

## 2024-03-21 DIAGNOSIS — E782 Mixed hyperlipidemia: Secondary | ICD-10-CM

## 2024-03-21 DIAGNOSIS — J301 Allergic rhinitis due to pollen: Secondary | ICD-10-CM

## 2024-03-21 MED ORDER — FENOFIBRATE MICRONIZED 134 MG PO CAPS: 134.0000 mg | ORAL_CAPSULE | Freq: Every day | ORAL | 0 refills | Status: DC

## 2024-03-22 ENCOUNTER — Encounter: Attending: Physician Assistant | Admitting: Physician Assistant

## 2024-03-22 DIAGNOSIS — J449 Chronic obstructive pulmonary disease, unspecified: Secondary | ICD-10-CM | POA: Diagnosis not present

## 2024-03-22 DIAGNOSIS — M79674 Pain in right toe(s): Secondary | ICD-10-CM | POA: Diagnosis not present

## 2024-03-22 DIAGNOSIS — I87332 Chronic venous hypertension (idiopathic) with ulcer and inflammation of left lower extremity: Secondary | ICD-10-CM | POA: Insufficient documentation

## 2024-03-22 DIAGNOSIS — B351 Tinea unguium: Secondary | ICD-10-CM | POA: Diagnosis not present

## 2024-03-22 DIAGNOSIS — I5042 Chronic combined systolic (congestive) and diastolic (congestive) heart failure: Secondary | ICD-10-CM | POA: Insufficient documentation

## 2024-03-22 DIAGNOSIS — F17200 Nicotine dependence, unspecified, uncomplicated: Secondary | ICD-10-CM | POA: Insufficient documentation

## 2024-03-22 DIAGNOSIS — I739 Peripheral vascular disease, unspecified: Secondary | ICD-10-CM | POA: Diagnosis not present

## 2024-03-22 DIAGNOSIS — Z716 Tobacco abuse counseling: Secondary | ICD-10-CM | POA: Diagnosis not present

## 2024-03-22 DIAGNOSIS — M79675 Pain in left toe(s): Secondary | ICD-10-CM | POA: Diagnosis not present

## 2024-03-22 DIAGNOSIS — L97822 Non-pressure chronic ulcer of other part of left lower leg with fat layer exposed: Secondary | ICD-10-CM | POA: Insufficient documentation

## 2024-03-22 DIAGNOSIS — I130 Hypertensive heart and chronic kidney disease with heart failure and stage 1 through stage 4 chronic kidney disease, or unspecified chronic kidney disease: Secondary | ICD-10-CM | POA: Insufficient documentation

## 2024-03-22 DIAGNOSIS — E1142 Type 2 diabetes mellitus with diabetic polyneuropathy: Secondary | ICD-10-CM | POA: Diagnosis not present

## 2024-03-22 DIAGNOSIS — N1832 Chronic kidney disease, stage 3b: Secondary | ICD-10-CM | POA: Diagnosis not present

## 2024-03-22 DIAGNOSIS — M792 Neuralgia and neuritis, unspecified: Secondary | ICD-10-CM | POA: Diagnosis not present

## 2024-03-22 DIAGNOSIS — F17218 Nicotine dependence, cigarettes, with other nicotine-induced disorders: Secondary | ICD-10-CM | POA: Diagnosis not present

## 2024-03-23 ENCOUNTER — Inpatient Hospital Stay: Admission: RE | Admit: 2024-03-23 | Source: Ambulatory Visit

## 2024-03-23 ENCOUNTER — Inpatient Hospital Stay
Admission: RE | Admit: 2024-03-23 | Discharge: 2024-03-23 | Disposition: A | Discharge status: Home / Self Care | Source: Ambulatory Visit

## 2024-03-23 HISTORY — DX: Urinary tract infection, site not specified: N39.0

## 2024-03-23 HISTORY — DX: Tachycardia, unspecified: R00.0

## 2024-03-23 HISTORY — DX: Anemia, unspecified: D64.9

## 2024-03-23 HISTORY — DX: Chronic pain syndrome: G89.4

## 2024-03-23 HISTORY — DX: Postlaminectomy syndrome, not elsewhere classified: M96.1

## 2024-03-23 HISTORY — DX: Spondylosis without myelopathy or radiculopathy, lumbar region: M47.816

## 2024-03-23 HISTORY — DX: Obstructive sleep apnea (adult) (pediatric): G47.33

## 2024-03-23 HISTORY — DX: Nicotine dependence, unspecified, uncomplicated: F17.200

## 2024-03-23 HISTORY — DX: Unspecified open wound, left lower leg, initial encounter: S81.802A

## 2024-03-23 HISTORY — DX: Chronic kidney disease, stage 3b: N18.32

## 2024-03-23 NOTE — Patient Instructions (Signed)
 Your procedure is scheduled on:03-28-24 Wednesday Report to the Registration Desk on the 1st floor of the [facility].Then proceed to the 2nd floor Surgery Desk To find out your arrival time, please call [phone] between 1PM - 3PM on:03-27-24 Tuesday If your arrival time is 6:00 am, do not arrive before that time as the [facility] entrance doors do not open until 6:00 am.  REMEMBER: Instructions that are not followed completely may result in serious medical risk, up to and including death; or upon the discretion of your surgeon and anesthesiologist your surgery may need to be rescheduled.  Do not eat food after midnight the night before surgery.  No gum chewing or hard candies.  You may however, drink CLEAR liquids up to 2 hours before you are scheduled to arrive for your surgery. Do not drink anything within 2 hours of your scheduled arrival time.  Clear liquids include: - water  - apple juice without pulp - gatorade (not RED colors) - black coffee or tea (Do NOT add milk or creamers to the coffee or tea) Do NOT drink anything that is not on this list  In addition, your doctor has ordered for you to drink the provided:  Ensure Pre-Surgery Clear Carbohydrate Drink  Drinking this carbohydrate drink up to two hours before surgery helps to reduce insulin resistance and improve patient outcomes. Please complete drinking 2 hours before scheduled arrival time.  One week prior to surgery:Stop NOW (03-23-24) Stop Anti-inflammatories (NSAIDS) such as Advil, Aleve, Ibuprofen, Motrin, Naproxen, Naprosyn and Aspirin based products such as Excedrin, Goody's Powder, BC Powder. Stop ANY OVER THE COUNTER supplements until after surgery.  You may however, continue to take Tylenol if needed for pain up until the day of surgery.  Continue taking all of your other prescription medications up until the day of surgery.  ON THE DAY OF SURGERY ONLY TAKE THESE MEDICATIONS WITH SIPS OF WATER: -busPIRone  (BUSPAR)  -cloNIDine (CATAPRES)  -diltiazem (CARDIZEM CD)  -escitalopram (LEXAPRO)  -gabapentin (NEURONTIN)  -levothyroxine (SYNTHROID)  -pantoprazole (PROTONIX)  -fenofibrate micronized (LOFIBRA)   No Alcohol for 24 hours before or after surgery.  No Smoking including e-cigarettes for 24 hours before surgery.  No chewable tobacco products for at least 6 hours before surgery.  No nicotine patches on the day of surgery.  Do not use any "recreational" drugs for at least a week (preferably 2 weeks) before your surgery.  Please be advised that the combination of cocaine and anesthesia may have negative outcomes, up to and including death. If you test positive for cocaine, your surgery will be cancelled.  On the morning of surgery brush your teeth with toothpaste and water, you may rinse your mouth with mouthwash if you wish. Do not swallow any toothpaste or mouthwash.  Use CHG Soap as directed on instruction sheet.  Do not wear jewelry, make-up, hairpins, clips or nail polish.  For welded (permanent) jewelry: bracelets, anklets, waist bands, etc.  Please have this removed prior to surgery.  If it is not removed, there is a chance that hospital personnel will need to cut it off on the day of surgery.  Do not wear lotions, powders, or perfumes.   Do not shave body hair from the neck down 48 hours before surgery.  Contact lenses, hearing aids and dentures may not be worn into surgery.  Do not bring valuables to the hospital. [facility] is not responsible for any missing/lost belongings or valuables.   Bring your C-PAP to the  hospital   Notify your doctor if there is any change in your medical condition (cold, fever, infection).  Wear comfortable clothing (specific to your surgery type) to the hospital.  After surgery, you can help prevent lung complications by doing breathing exercises.  Take deep breaths and cough every 1-2 hours. Your doctor may order a device called an  Incentive Spirometer to help you take deep breaths. When coughing or sneezing, hold a pillow firmly against your incision with both hands. This is called "splinting." Doing this helps protect your incision. It also decreases belly discomfort.  If you are being admitted to the hospital overnight, leave your suitcase in the car. After surgery it may be brought to your room.  In case of increased patient census, it may be necessary for you, the patient, to continue your postoperative care in the Same Day Surgery department.  If you are being discharged the day of surgery, you will not be allowed to drive home. You will need a responsible individual to drive you home and stay with you for 24 hours after surgery.   If you are taking public transportation, you will need to have a responsible individual with you.  Please call the Pre-admissions Testing Dept. at [phone] if you have any questions about these instructions.  Surgery Visitation Policy:  Patients having surgery or a procedure may have two visitors.  Children under the age of 27 must have an adult with them who is not the patient.      Preparing for Surgery with CHLORHEXIDINE GLUCONATE (CHG) Soap  Chlorhexidine Gluconate (CHG) Soap  o An antiseptic cleaner that kills germs and bonds with the skin to continue killing germs even after washing  o Used for showering the night before surgery and morning of surgery  Before surgery, you can play an important role by reducing the number of germs on your skin.  CHG (Chlorhexidine gluconate) soap is an antiseptic cleanser which kills germs and bonds with the skin to continue killing germs even after washing.  Please do not use if you have an allergy to CHG or antibacterial soaps. If your skin becomes reddened/irritated stop using the CHG.  1. Shower the NIGHT BEFORE SURGERY and the MORNING OF SURGERY with CHG soap.  2. If you choose to wash your hair, wash your hair first as  usual with your normal shampoo.  3. After shampooing, rinse your hair and body thoroughly to remove the shampoo.  4. Use CHG as you would any other liquid soap. You can apply CHG directly to the skin and wash gently with a scrungie or a clean washcloth.  5. Apply the CHG soap to your body only from the neck down. Do not use on open wounds or open sores. Avoid contact with your eyes, ears, mouth, and genitals (private parts). Wash face and genitals (private parts) with your normal soap.  6. Wash thoroughly, paying special attention to the area where your surgery will be performed.  7. Thoroughly rinse your body with warm water.  8. Do not shower/wash with your normal soap after using and rinsing off the CHG soap.  9. Pat yourself dry with a clean towel.  10. Wear clean pajamas to bed the night before surgery.  12. Place clean sheets on your bed the night of your first shower and do not sleep with pets.  13. Shower again with the CHG soap on the day of surgery prior to arriving at the hospital.  14. Do not apply  any deodorants/lotions/powders.  15. Please wear clean clothes to the hospital.  How to Use an Incentive Spirometer An incentive spirometer is a tool that measures how well you are filling your lungs with each breath. Learning to take long, deep breaths using this tool can help you keep your lungs clear and active. This may help to reverse or lessen your chance of developing breathing (pulmonary) problems, especially infection. You may be asked to use a spirometer: After a surgery. If you have a lung problem or a history of smoking. After a long period of time when you have been unable to move or be active. If the spirometer includes an indicator to show the highest number that you have reached, your health care provider or respiratory therapist will help you set a goal. Keep a log of your progress as told by your health care provider. What are the risks? Breathing too quickly  may cause dizziness or cause you to pass out. Take your time so you do not get dizzy or light-headed. If you are in pain, you may need to take pain medicine before doing incentive spirometry. It is harder to take a deep breath if you are having pain. How to use your incentive spirometer  Sit up on the edge of your bed or on a chair. Hold the incentive spirometer so that it is in an upright position. Before you use the spirometer, breathe out normally. Place the mouthpiece in your mouth. Make sure your lips are closed tightly around it. Breathe in slowly and as deeply as you can through your mouth, causing the piston or the ball to rise toward the top of the chamber. Hold your breath for 3-5 seconds, or for as long as possible. If the spirometer includes a coach indicator, use this to guide you in breathing. Slow down your breathing if the indicator goes above the marked areas. Remove the mouthpiece from your mouth and breathe out normally. The piston or ball will return to the bottom of the chamber. Rest for a few seconds, then repeat the steps 10 or more times. Take your time and take a few normal breaths between deep breaths so that you do not get dizzy or light-headed. Do this every 1-2 hours when you are awake. If the spirometer includes a goal marker to show the highest number you have reached (best effort), use this as a goal to work toward during each repetition. After each set of 10 deep breaths, cough a few times. This will help to make sure that your lungs are clear. If you have an incision on your chest or abdomen from surgery, place a pillow or a rolled-up towel firmly against the incision when you cough. This can help to reduce pain while taking deep breaths and coughing. General tips When you are able to get out of bed: Walk around often. Continue to take deep breaths and cough in order to clear your lungs. Keep using the incentive spirometer until your health care provider says  it is okay to stop using it. If you have been in the hospital, you may be told to keep using the spirometer at home. Contact a health care provider if: You are having difficulty using the spirometer. You have trouble using the spirometer as often as instructed. Your pain medicine is not giving enough relief for you to use the spirometer as told. You have a fever. Get help right away if: You develop shortness of breath. You develop a cough with bloody  mucus from the lungs. You have fluid or blood coming from an incision site after you cough. Summary An incentive spirometer is a tool that can help you learn to take long, deep breaths to keep your lungs clear and active. You may be asked to use a spirometer after a surgery, if you have a lung problem or a history of smoking, or if you have been inactive for a long period of time. Use your incentive spirometer as instructed every 1-2 hours while you are awake. If you have an incision on your chest or abdomen, place a pillow or a rolled-up towel firmly against your incision when you cough. This will help to reduce pain. Get help right away if you have shortness of breath, you cough up bloody mucus, or blood comes from your incision when you cough. This information is not intended to replace advice given to you by your health care provider. Make sure you discuss any questions you have with your health care provider. Document Revised: 10/07/2023 Document Reviewed: 10/07/2023 Elsevier Patient Education  2024 [employer].

## 2024-03-26 ENCOUNTER — Other Ambulatory Visit: Payer: Self-pay

## 2024-03-26 ENCOUNTER — Encounter
Admission: RE | Admit: 2024-03-26 | Discharge: 2024-03-26 | Disposition: A | Discharge status: Home / Self Care | Source: Ambulatory Visit | Attending: Surgery | Admitting: Surgery

## 2024-03-26 NOTE — Patient Instructions (Addendum)
 Your procedure is scheduled on:03-28-24 Wednesday Report to the Registration Desk on the 1st floor of the [facility].Then proceed to the 2nd floor Surgery Desk To find out your arrival time, please call [phone] between 1PM - 3PM on:03-27-24 Tuesday If your arrival time is 6:00 am, do not arrive before that time as the [facility] entrance doors do not open until 6:00 am.  REMEMBER: Instructions that are not followed completely may result in serious medical risk, up to and including death; or upon the discretion of your surgeon and anesthesiologist your surgery may need to be rescheduled.  Do not eat food after midnight the night before surgery.  No gum chewing or hard candies.  You may however, drink CLEAR liquids up to 2 hours before you are scheduled to arrive for your surgery. Do not drink anything within 2 hours of your scheduled arrival time.  Clear liquids include: - water  - apple juice without pulp - gatorade (not RED colors) - black coffee or tea (Do NOT add milk or creamers to the coffee or tea) Do NOT drink anything that is not on this list.  In addition, your doctor has ordered for you to drink the provided:  Ensure Pre-Surgery Clear Carbohydrate Drink  Drinking this carbohydrate drink up to two hours before surgery helps to reduce insulin resistance and improve patient outcomes. Please complete drinking 2 hours before scheduled arrival time.  One week prior to surgery:Stop NOW (03-26-24) Stop Anti-inflammatories (NSAIDS) such as Advil, Aleve, Ibuprofen, Motrin, Naproxen, Naprosyn and Aspirin based products such as Excedrin, Goody's Powder, BC Powder. Stop ANY OVER THE COUNTER supplements until after surgery (Probiotic)  You may however, continue to take Tylenol if needed for pain up until the day of surgery.  Continue taking all of your other prescription medications up until the day of surgery.  ON THE DAY OF SURGERY ONLY TAKE THESE MEDICATIONS WITH SIPS OF  WATER: -busPIRone (BUSPAR)  -cloNIDine (CATAPRES)  -diltiazem (CARDIZEM CD)  -escitalopram (LEXAPRO)  -fenofibrate micronized (LOFIBRA)  -gabapentin (NEURONTIN)  -levothyroxine (SYNTHROID)  -pantoprazole (PROTONIX)   Use your Spiriva, Symbicort and Albuterol Inhaler the day of surgery and bring your Albuterol Inhaler to the hospital  No Alcohol for 24 hours before or after surgery.  No Smoking including e-cigarettes for 24 hours before surgery.  No chewable tobacco products for at least 6 hours before surgery.  No nicotine patches on the day of surgery.  Do not use any "recreational" drugs for at least a week (preferably 2 weeks) before your surgery.  Please be advised that the combination of cocaine and anesthesia may have negative outcomes, up to and including death. If you test positive for cocaine, your surgery will be cancelled.  On the morning of surgery brush your teeth with toothpaste and water, you may rinse your mouth with mouthwash if you wish. Do not swallow any toothpaste or mouthwash.  Use CHG Soap as directed on instruction sheet.  Do not wear jewelry, make-up, hairpins, clips or nail polish.  For welded (permanent) jewelry: bracelets, anklets, waist bands, etc.  Please have this removed prior to surgery.  If it is not removed, there is a chance that hospital personnel will need to cut it off on the day of surgery.  Do not wear lotions, powders, or perfumes.   Do not shave body hair from the neck down 48 hours before surgery.  Contact lenses, hearing aids and dentures may not be worn into surgery.  Do not bring valuables  to the hospital. [facility] is not responsible for any missing/lost belongings or valuables.   Bring your C-PAP to the hospital  Notify your doctor if there is any change in your medical condition (cold, fever, infection).  Wear comfortable clothing (specific to your surgery type) to the hospital.  After surgery, you can help prevent lung  complications by doing breathing exercises.  Take deep breaths and cough every 1-2 hours. Your doctor may order a device called an Incentive Spirometer to help you take deep breaths. When coughing or sneezing, hold a pillow firmly against your incision with both hands. This is called "splinting." Doing this helps protect your incision. It also decreases belly discomfort.  If you are being admitted to the hospital overnight, leave your suitcase in the car. After surgery it may be brought to your room.  In case of increased patient census, it may be necessary for you, the patient, to continue your postoperative care in the Same Day Surgery department.  If you are being discharged the day of surgery, you will not be allowed to drive home. You will need a responsible individual to drive you home and stay with you for 24 hours after surgery.   If you are taking public transportation, you will need to have a responsible individual with you.  Please call the Pre-admissions Testing Dept. at [phone] if you have any questions about these instructions.  Surgery Visitation Policy:  Patients having surgery or a procedure may have two visitors.  Children under the age of 42 must have an adult with them who is not the patient.     Preparing for Surgery with CHLORHEXIDINE GLUCONATE (CHG) Soap  Chlorhexidine Gluconate (CHG) Soap  o An antiseptic cleaner that kills germs and bonds with the skin to continue killing germs even after washing  o Used for showering the night before surgery and morning of surgery  Before surgery, you can play an important role by reducing the number of germs on your skin.  CHG (Chlorhexidine gluconate) soap is an antiseptic cleanser which kills germs and bonds with the skin to continue killing germs even after washing.  Please do not use if you have an allergy to CHG or antibacterial soaps. If your skin becomes reddened/irritated stop using the CHG.  1. Shower the  NIGHT BEFORE SURGERY and the MORNING OF SURGERY with CHG soap.  2. If you choose to wash your hair, wash your hair first as usual with your normal shampoo.  3. After shampooing, rinse your hair and body thoroughly to remove the shampoo.  4. Use CHG as you would any other liquid soap. You can apply CHG directly to the skin and wash gently with a scrungie or a clean washcloth.  5. Apply the CHG soap to your body only from the neck down. Do not use on open wounds or open sores. Avoid contact with your eyes, ears, mouth, and genitals (private parts). Wash face and genitals (private parts) with your normal soap.  6. Wash thoroughly, paying special attention to the area where your surgery will be performed.  7. Thoroughly rinse your body with warm water.  8. Do not shower/wash with your normal soap after using and rinsing off the CHG soap.  9. Pat yourself dry with a clean towel.  10. Wear clean pajamas to bed the night before surgery.  12. Place clean sheets on your bed the night of your first shower and do not sleep with pets.  13. Shower again with  the CHG soap on the day of surgery prior to arriving at the hospital.  14. Do not apply any deodorants/lotions/powders.  15. Please wear clean clothes to the hospital.  How to Use an Incentive Spirometer An incentive spirometer is a tool that measures how well you are filling your lungs with each breath. Learning to take long, deep breaths using this tool can help you keep your lungs clear and active. This may help to reverse or lessen your chance of developing breathing (pulmonary) problems, especially infection. You may be asked to use a spirometer: After a surgery. If you have a lung problem or a history of smoking. After a long period of time when you have been unable to move or be active. If the spirometer includes an indicator to show the highest number that you have reached, your health care provider or respiratory therapist will help  you set a goal. Keep a log of your progress as told by your health care provider. What are the risks? Breathing too quickly may cause dizziness or cause you to pass out. Take your time so you do not get dizzy or light-headed. If you are in pain, you may need to take pain medicine before doing incentive spirometry. It is harder to take a deep breath if you are having pain. How to use your incentive spirometer  Sit up on the edge of your bed or on a chair. Hold the incentive spirometer so that it is in an upright position. Before you use the spirometer, breathe out normally. Place the mouthpiece in your mouth. Make sure your lips are closed tightly around it. Breathe in slowly and as deeply as you can through your mouth, causing the piston or the ball to rise toward the top of the chamber. Hold your breath for 3-5 seconds, or for as long as possible. If the spirometer includes a coach indicator, use this to guide you in breathing. Slow down your breathing if the indicator goes above the marked areas. Remove the mouthpiece from your mouth and breathe out normally. The piston or ball will return to the bottom of the chamber. Rest for a few seconds, then repeat the steps 10 or more times. Take your time and take a few normal breaths between deep breaths so that you do not get dizzy or light-headed. Do this every 1-2 hours when you are awake. If the spirometer includes a goal marker to show the highest number you have reached (best effort), use this as a goal to work toward during each repetition. After each set of 10 deep breaths, cough a few times. This will help to make sure that your lungs are clear. If you have an incision on your chest or abdomen from surgery, place a pillow or a rolled-up towel firmly against the incision when you cough. This can help to reduce pain while taking deep breaths and coughing. General tips When you are able to get out of bed: Walk around often. Continue to take  deep breaths and cough in order to clear your lungs. Keep using the incentive spirometer until your health care provider says it is okay to stop using it. If you have been in the hospital, you may be told to keep using the spirometer at home. Contact a health care provider if: You are having difficulty using the spirometer. You have trouble using the spirometer as often as instructed. Your pain medicine is not giving enough relief for you to use the spirometer as told. You  have a fever. Get help right away if: You develop shortness of breath. You develop a cough with bloody mucus from the lungs. You have fluid or blood coming from an incision site after you cough. Summary An incentive spirometer is a tool that can help you learn to take long, deep breaths to keep your lungs clear and active. You may be asked to use a spirometer after a surgery, if you have a lung problem or a history of smoking, or if you have been inactive for a long period of time. Use your incentive spirometer as instructed every 1-2 hours while you are awake. If you have an incision on your chest or abdomen, place a pillow or a rolled-up towel firmly against your incision when you cough. This will help to reduce pain. Get help right away if you have shortness of breath, you cough up bloody mucus, or blood comes from your incision when you cough. This information is not intended to replace advice given to you by your health care provider. Make sure you discuss any questions you have with your health care provider. Document Revised: 10/07/2023 Document Reviewed: 10/07/2023 Elsevier Patient Education  2024 [employer].

## 2024-03-27 ENCOUNTER — Ambulatory Visit

## 2024-03-27 ENCOUNTER — Ambulatory Visit: Admitting: Internal Medicine

## 2024-03-28 ENCOUNTER — Encounter: Admission: RE | Payer: Self-pay | Source: Home / Self Care

## 2024-03-28 ENCOUNTER — Inpatient Hospital Stay
Admission: EM | Admit: 2024-03-28 | Discharge: 2024-03-31 | DRG: 439 | Disposition: A | Discharge status: Home / Self Care | Attending: Family Medicine | Admitting: Family Medicine

## 2024-03-28 ENCOUNTER — Ambulatory Visit: Admission: RE | Admit: 2024-03-28 | Source: Home / Self Care | Admitting: Surgery

## 2024-03-28 ENCOUNTER — Encounter: Payer: Self-pay | Admitting: Emergency Medicine

## 2024-03-28 ENCOUNTER — Emergency Department

## 2024-03-28 ENCOUNTER — Other Ambulatory Visit: Payer: Self-pay

## 2024-03-28 DIAGNOSIS — F17210 Nicotine dependence, cigarettes, uncomplicated: Secondary | ICD-10-CM | POA: Diagnosis present

## 2024-03-28 DIAGNOSIS — G894 Chronic pain syndrome: Secondary | ICD-10-CM | POA: Diagnosis present

## 2024-03-28 DIAGNOSIS — G2581 Restless legs syndrome: Secondary | ICD-10-CM | POA: Diagnosis present

## 2024-03-28 DIAGNOSIS — N1832 Chronic kidney disease, stage 3b: Secondary | ICD-10-CM | POA: Diagnosis present

## 2024-03-28 DIAGNOSIS — J439 Emphysema, unspecified: Secondary | ICD-10-CM | POA: Diagnosis present

## 2024-03-28 DIAGNOSIS — Z87440 Personal history of urinary (tract) infections: Secondary | ICD-10-CM

## 2024-03-28 DIAGNOSIS — F419 Anxiety disorder, unspecified: Secondary | ICD-10-CM | POA: Diagnosis present

## 2024-03-28 DIAGNOSIS — Z7951 Long term (current) use of inhaled steroids: Secondary | ICD-10-CM

## 2024-03-28 DIAGNOSIS — I5032 Chronic diastolic (congestive) heart failure: Secondary | ICD-10-CM | POA: Diagnosis present

## 2024-03-28 DIAGNOSIS — I161 Hypertensive emergency: Secondary | ICD-10-CM | POA: Diagnosis present

## 2024-03-28 DIAGNOSIS — Z981 Arthrodesis status: Secondary | ICD-10-CM

## 2024-03-28 DIAGNOSIS — E039 Hypothyroidism, unspecified: Secondary | ICD-10-CM | POA: Diagnosis present

## 2024-03-28 DIAGNOSIS — Z9981 Dependence on supplemental oxygen: Secondary | ICD-10-CM

## 2024-03-28 DIAGNOSIS — Z792 Long term (current) use of antibiotics: Secondary | ICD-10-CM

## 2024-03-28 DIAGNOSIS — G4733 Obstructive sleep apnea (adult) (pediatric): Secondary | ICD-10-CM | POA: Diagnosis present

## 2024-03-28 DIAGNOSIS — Z8249 Family history of ischemic heart disease and other diseases of the circulatory system: Secondary | ICD-10-CM

## 2024-03-28 DIAGNOSIS — I2510 Atherosclerotic heart disease of native coronary artery without angina pectoris: Secondary | ICD-10-CM | POA: Diagnosis present

## 2024-03-28 DIAGNOSIS — E1122 Type 2 diabetes mellitus with diabetic chronic kidney disease: Secondary | ICD-10-CM | POA: Diagnosis present

## 2024-03-28 DIAGNOSIS — R188 Other ascites: Secondary | ICD-10-CM | POA: Diagnosis not present

## 2024-03-28 DIAGNOSIS — Z886 Allergy status to analgesic agent status: Secondary | ICD-10-CM

## 2024-03-28 DIAGNOSIS — I499 Cardiac arrhythmia, unspecified: Secondary | ICD-10-CM | POA: Diagnosis not present

## 2024-03-28 DIAGNOSIS — Z96653 Presence of artificial knee joint, bilateral: Secondary | ICD-10-CM | POA: Diagnosis present

## 2024-03-28 DIAGNOSIS — Z885 Allergy status to narcotic agent status: Secondary | ICD-10-CM

## 2024-03-28 DIAGNOSIS — R0902 Hypoxemia: Secondary | ICD-10-CM | POA: Diagnosis not present

## 2024-03-28 DIAGNOSIS — E1151 Type 2 diabetes mellitus with diabetic peripheral angiopathy without gangrene: Secondary | ICD-10-CM | POA: Diagnosis present

## 2024-03-28 DIAGNOSIS — Z79899 Other long term (current) drug therapy: Secondary | ICD-10-CM

## 2024-03-28 DIAGNOSIS — E871 Hypo-osmolality and hyponatremia: Secondary | ICD-10-CM | POA: Diagnosis present

## 2024-03-28 DIAGNOSIS — K8500 Idiopathic acute pancreatitis without necrosis or infection: Secondary | ICD-10-CM | POA: Diagnosis not present

## 2024-03-28 DIAGNOSIS — K828 Other specified diseases of gallbladder: Secondary | ICD-10-CM | POA: Diagnosis present

## 2024-03-28 DIAGNOSIS — N261 Atrophy of kidney (terminal): Secondary | ICD-10-CM | POA: Diagnosis present

## 2024-03-28 DIAGNOSIS — I130 Hypertensive heart and chronic kidney disease with heart failure and stage 1 through stage 4 chronic kidney disease, or unspecified chronic kidney disease: Secondary | ICD-10-CM | POA: Diagnosis present

## 2024-03-28 DIAGNOSIS — R0789 Other chest pain: Secondary | ICD-10-CM | POA: Diagnosis not present

## 2024-03-28 DIAGNOSIS — F32A Depression, unspecified: Secondary | ICD-10-CM | POA: Diagnosis present

## 2024-03-28 DIAGNOSIS — K859 Acute pancreatitis without necrosis or infection, unspecified: Principal | ICD-10-CM | POA: Diagnosis present

## 2024-03-28 DIAGNOSIS — E785 Hyperlipidemia, unspecified: Secondary | ICD-10-CM | POA: Diagnosis present

## 2024-03-28 DIAGNOSIS — Z820 Family history of epilepsy and other diseases of the nervous system: Secondary | ICD-10-CM

## 2024-03-28 DIAGNOSIS — J9601 Acute respiratory failure with hypoxia: Secondary | ICD-10-CM | POA: Diagnosis not present

## 2024-03-28 DIAGNOSIS — R079 Chest pain, unspecified: Secondary | ICD-10-CM | POA: Diagnosis not present

## 2024-03-28 DIAGNOSIS — J9811 Atelectasis: Secondary | ICD-10-CM | POA: Diagnosis not present

## 2024-03-28 DIAGNOSIS — I700 Atherosclerosis of aorta: Secondary | ICD-10-CM | POA: Diagnosis not present

## 2024-03-28 DIAGNOSIS — I517 Cardiomegaly: Secondary | ICD-10-CM | POA: Diagnosis not present

## 2024-03-28 DIAGNOSIS — Z888 Allergy status to other drugs, medicaments and biological substances status: Secondary | ICD-10-CM

## 2024-03-28 DIAGNOSIS — R001 Bradycardia, unspecified: Secondary | ICD-10-CM | POA: Diagnosis present

## 2024-03-28 DIAGNOSIS — K8689 Other specified diseases of pancreas: Secondary | ICD-10-CM | POA: Diagnosis not present

## 2024-03-28 LAB — TROPONIN I (HIGH SENSITIVITY)
Troponin I (High Sensitivity): 6 ng/L (ref <18)
Troponin I (High Sensitivity): 5 ng/L (ref <18)

## 2024-03-28 LAB — HEPATIC FUNCTION PANEL
ALT: 12 U/L (ref 0–44)
AST: 18 U/L (ref 15–41)
Albumin: 3.5 g/dL (ref 3.5–5.0)
Alkaline Phosphatase: 18 U/L — ABNORMAL LOW (ref 38–126)
Bilirubin, Direct: 0.1 mg/dL (ref 0.0–0.2)
Indirect Bilirubin: 0.4 mg/dL (ref 0.3–0.9)
Total Bilirubin: 0.5 mg/dL (ref 0.0–1.2)
Total Protein: 6.3 g/dL — ABNORMAL LOW (ref 6.5–8.1)

## 2024-03-28 LAB — LIPASE, BLOOD: Lipase: 311 U/L — ABNORMAL HIGH (ref 11–51)

## 2024-03-28 LAB — LIPID PANEL
Cholesterol: 121 mg/dL (ref 0–200)
HDL: 39 mg/dL — ABNORMAL LOW (ref >40)
LDL Cholesterol: 58 mg/dL (ref 0–99)
Total CHOL/HDL Ratio: 3.1 ratio
Triglycerides: 118 mg/dL (ref <150)
VLDL: 24 mg/dL (ref 0–40)

## 2024-03-28 LAB — CBC
HCT: 40.4 % (ref 36.0–46.0)
Hemoglobin: 13.3 g/dL (ref 12.0–15.0)
MCH: 31.4 pg (ref 26.0–34.0)
MCHC: 32.9 g/dL (ref 30.0–36.0)
MCV: 95.3 fL (ref 80.0–100.0)
Platelets: 295 10*3/uL (ref 150–400)
RBC: 4.24 MIL/uL (ref 3.87–5.11)
RDW: 12.9 % (ref 11.5–15.5)
WBC: 11.8 10*3/uL — ABNORMAL HIGH (ref 4.0–10.5)
nRBC: 0 % (ref 0.0–0.2)

## 2024-03-28 LAB — BASIC METABOLIC PANEL WITH GFR
Anion gap: 6 (ref 5–15)
BUN: 13 mg/dL (ref 8–23)
CO2: 21 mmol/L — ABNORMAL LOW (ref 22–32)
Calcium: 9.1 mg/dL (ref 8.9–10.3)
Chloride: 103 mmol/L (ref 98–111)
Creatinine, Ser: 0.8 mg/dL (ref 0.44–1.00)
GFR, Estimated: >60 mL/min (ref >60)
Glucose, Bld: 116 mg/dL — ABNORMAL HIGH (ref 70–99)
Potassium: 3.9 mmol/L (ref 3.5–5.1)
Sodium: 130 mmol/L — ABNORMAL LOW (ref 135–145)

## 2024-03-28 SURGERY — REMOVAL, HARDWARE
Anesthesia: Choice | Site: Hand | Laterality: Left

## 2024-03-28 MED ORDER — FENOFIBRATE 160 MG PO TABS
160.0000 mg | ORAL_TABLET | Freq: Every day | ORAL | Status: DC
  Administered 2024-03-29 – 2024-03-31 (×3): 160 mg via ORAL
  Filled 2024-03-28 (×3): qty 1

## 2024-03-28 MED ORDER — HYDROMORPHONE HCL 1 MG/ML IJ SOLN
0.5000 mg | INTRAMUSCULAR | Status: DC | PRN
  Administered 2024-03-28 – 2024-03-30 (×7): 0.5 mg via INTRAVENOUS
  Filled 2024-03-28 (×8): qty 0.5

## 2024-03-28 MED ORDER — HYDRALAZINE HCL 20 MG/ML IJ SOLN
10.0000 mg | Freq: Once | INTRAMUSCULAR | Status: AC
Start: 2024-03-28 — End: 2024-03-28
  Administered 2024-03-28: 10 mg via INTRAVENOUS
  Filled 2024-03-28: qty 1

## 2024-03-28 MED ORDER — UMECLIDINIUM BROMIDE 62.5 MCG/ACT IN AEPB
1.0000 | INHALATION_SPRAY | Freq: Every day | RESPIRATORY_TRACT | Status: DC
  Administered 2024-03-29 – 2024-03-31 (×3): 1 via RESPIRATORY_TRACT
  Filled 2024-03-28: qty 7

## 2024-03-28 MED ORDER — FLUTICASONE FUROATE-VILANTEROL 200-25 MCG/ACT IN AEPB
1.0000 | INHALATION_SPRAY | Freq: Every day | RESPIRATORY_TRACT | Status: DC
  Administered 2024-03-29 – 2024-03-31 (×3): 1 via RESPIRATORY_TRACT
  Filled 2024-03-28: qty 28

## 2024-03-28 MED ORDER — HYDRALAZINE HCL 20 MG/ML IJ SOLN
5.0000 mg | Freq: Four times a day (QID) | INTRAMUSCULAR | Status: DC | PRN
  Administered 2024-03-28: 5 mg via INTRAVENOUS
  Filled 2024-03-28: qty 1

## 2024-03-28 MED ORDER — PANTOPRAZOLE SODIUM 40 MG IV SOLR
40.0000 mg | Freq: Once | INTRAVENOUS | Status: AC
  Administered 2024-03-28: 40 mg via INTRAVENOUS
  Filled 2024-03-28: qty 10

## 2024-03-28 MED ORDER — ESCITALOPRAM OXALATE 10 MG PO TABS
20.0000 mg | ORAL_TABLET | Freq: Every day | ORAL | Status: DC
  Administered 2024-03-29 – 2024-03-31 (×3): 20 mg via ORAL
  Filled 2024-03-28 (×3): qty 2

## 2024-03-28 MED ORDER — ONDANSETRON HCL 4 MG PO TABS: 4.0000 mg | ORAL_TABLET | Freq: Four times a day (QID) | ORAL | Status: DC | PRN

## 2024-03-28 MED ORDER — SODIUM CHLORIDE 0.9 % IV SOLN: INTRAVENOUS | Status: AC

## 2024-03-28 MED ORDER — CLONIDINE HCL 0.1 MG PO TABS
0.2000 mg | ORAL_TABLET | Freq: Two times a day (BID) | ORAL | Status: DC
  Administered 2024-03-28 – 2024-03-31 (×6): 0.2 mg via ORAL
  Filled 2024-03-28 (×7): qty 2

## 2024-03-28 MED ORDER — PANTOPRAZOLE SODIUM 40 MG PO TBEC
40.0000 mg | DELAYED_RELEASE_TABLET | Freq: Every morning | ORAL | Status: DC
  Administered 2024-03-29 – 2024-03-31 (×3): 40 mg via ORAL
  Filled 2024-03-28 (×3): qty 1

## 2024-03-28 MED ORDER — PRAMIPEXOLE DIHYDROCHLORIDE 0.25 MG PO TABS
0.5000 mg | ORAL_TABLET | Freq: Every day | ORAL | Status: DC
  Administered 2024-03-28 – 2024-03-30 (×3): 0.5 mg via ORAL
  Filled 2024-03-28 (×3): qty 2

## 2024-03-28 MED ORDER — SODIUM CHLORIDE 0.9 % IV SOLN
1.0000 g | INTRAVENOUS | Status: DC
  Administered 2024-03-28 – 2024-03-29 (×2): 1 g via INTRAVENOUS
  Filled 2024-03-28 (×3): qty 10

## 2024-03-28 MED ORDER — LEVOTHYROXINE SODIUM 50 MCG PO TABS
25.0000 ug | ORAL_TABLET | Freq: Every day | ORAL | Status: DC
  Administered 2024-03-29 – 2024-03-31 (×3): 25 ug via ORAL
  Filled 2024-03-28 (×3): qty 1

## 2024-03-28 MED ORDER — DILTIAZEM HCL ER COATED BEADS 180 MG PO CP24
180.0000 mg | ORAL_CAPSULE | Freq: Every day | ORAL | Status: DC
  Administered 2024-03-29 – 2024-03-31 (×3): 180 mg via ORAL
  Filled 2024-03-28 (×3): qty 1

## 2024-03-28 MED ORDER — ALBUTEROL SULFATE (2.5 MG/3ML) 0.083% IN NEBU: 3.0000 mL | INHALATION_SOLUTION | RESPIRATORY_TRACT | Status: DC | PRN

## 2024-03-28 MED ORDER — MINOXIDIL 2.5 MG PO TABS
7.5000 mg | ORAL_TABLET | Freq: Every day | ORAL | Status: DC
  Administered 2024-03-28 – 2024-03-31 (×4): 7.5 mg via ORAL
  Filled 2024-03-28 (×4): qty 3

## 2024-03-28 MED ORDER — SODIUM CHLORIDE 0.9 % IV BOLUS
1000.0000 mL | Freq: Once | INTRAVENOUS | Status: AC
  Administered 2024-03-28: 1000 mL via INTRAVENOUS

## 2024-03-28 MED ORDER — DIPHENHYDRAMINE HCL 25 MG PO CAPS
50.0000 mg | ORAL_CAPSULE | Freq: Every day | ORAL | Status: DC | PRN
  Administered 2024-03-28 – 2024-03-29 (×2): 50 mg via ORAL
  Filled 2024-03-28 (×2): qty 2

## 2024-03-28 MED ORDER — GABAPENTIN 400 MG PO CAPS
800.0000 mg | ORAL_CAPSULE | Freq: Three times a day (TID) | ORAL | Status: DC
  Administered 2024-03-28 – 2024-03-31 (×8): 800 mg via ORAL
  Filled 2024-03-28 (×8): qty 2

## 2024-03-28 MED ORDER — TIOTROPIUM BROMIDE MONOHYDRATE 18 MCG IN CAPS: 18.0000 ug | ORAL_CAPSULE | Freq: Every day | RESPIRATORY_TRACT | Status: DC

## 2024-03-28 MED ORDER — MORPHINE SULFATE (PF) 4 MG/ML IV SOLN
4.0000 mg | Freq: Once | INTRAVENOUS | Status: AC
  Administered 2024-03-28: 4 mg via INTRAVENOUS
  Filled 2024-03-28: qty 1

## 2024-03-28 MED ORDER — ONDANSETRON HCL 4 MG/2ML IJ SOLN: 4.0000 mg | Freq: Four times a day (QID) | INTRAMUSCULAR | Status: DC | PRN

## 2024-03-28 MED ORDER — ONDANSETRON HCL 4 MG/2ML IJ SOLN
4.0000 mg | Freq: Once | INTRAMUSCULAR | Status: AC
  Administered 2024-03-28: 4 mg via INTRAVENOUS
  Filled 2024-03-28: qty 2

## 2024-03-28 MED ORDER — IOHEXOL 300 MG/ML  SOLN
100.0000 mL | Freq: Once | INTRAMUSCULAR | Status: AC | PRN
  Administered 2024-03-28: 100 mL via INTRAVENOUS

## 2024-03-28 MED ORDER — IRBESARTAN 150 MG PO TABS
300.0000 mg | ORAL_TABLET | Freq: Every day | ORAL | Status: DC
  Administered 2024-03-29 – 2024-03-31 (×3): 300 mg via ORAL
  Filled 2024-03-28 (×3): qty 2

## 2024-03-28 MED ORDER — BUSPIRONE HCL 15 MG PO TABS
30.0000 mg | ORAL_TABLET | Freq: Two times a day (BID) | ORAL | Status: DC
  Administered 2024-03-28 – 2024-03-31 (×6): 30 mg via ORAL
  Filled 2024-03-28 (×4): qty 6
  Filled 2024-03-28: qty 2
  Filled 2024-03-28 (×2): qty 6

## 2024-03-28 NOTE — ED Provider Notes (Signed)
 [facility] Provider Note    Event Date/Time   First MD Initiated Contact with Patient 03/28/24 1129     (approximate)   History   Chief Complaint: Chest Pain   HPI  [name] is a 68 y.o. female with a history of CHF, CKD, COPD, hypertension who comes ED complaining of chest pain that started this morning, she indicates her epigastrium.  Was given nitroglycerin by EMS and by her spouse at home without relief.  Nonradiating.  Severe.        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)    Community acquired pneumonia 01/10/2013   COPD (chronic obstructive pulmonary disease) (HCC)    no inhalers--smoker, no oxygen   Depression    Dyspnea    Emphysema lung (HCC)    Failed back surgical syndrome    GERD (gastroesophageal reflux disease)    Hyperlipidemia    Hypertension    Hypothyroid    Lumbar spondylosis    Nicotine dependence    Normocytic anemia    OSA on CPAP    Peripheral vascular disease (HCC)    Pre-diabetes    Recurrent UTI (urinary tract infection)    Restless leg syndrome    Seizures (HCC)    02/13/2011 -hx of seizure due to "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  no seizure since--pt does take keppra and office note from neurologist dr. Marjory Lies on this chart   Sepsis due to pneumonia [facility])    Sinus tachycardia    Urinary incontinence    Wound of left leg    being seen at wound clinic as of 03-23-24    Current Outpatient Rx   Order #:  [id number] Class: Historical Med   Order #: [id number] Class: Historical Med   Order #: [id number] Class: Normal   Order #: [id number] Class: Historical Med   Order #: [id number] Class: Normal   Order #: [id number] Class: Historical Med   Order #: [id number] Class: Normal   Order #: [id number] Class: Historical Med   Order #: [id number] Class: Historical Med   Order #: [id number] Class: Normal   Order #: [id number] Class: Normal   Order #: [id number] Class: Normal   Order #: [id number] Class: Normal   Order #: [id number] Class: Normal   Order #: [id number] Class: Normal   Order #: [id number] Class: Normal   Order #: [id number] Class: Historical Med   Order #: [id number] Class: Historical Med   Order #: [id number] Class: Print   Order #: [id number] Class: Historical Med   Order #: [id number] Class: Normal   Order #: [id number] Class: Normal   Order #: [id number] Class: Historical Med   Order #: [id number] Class: Normal    Past Surgical History:  Procedure Laterality Date   CARPAL TUNNEL RELEASE     left   COLONOSCOPY WITH PROPOFOL N/A 06/17/2022   Procedure: COLONOSCOPY WITH PROPOFOL;  Surgeon: Regis Bill, MD;  Location: [facility] ENDOSCOPY;  Service: Endoscopy;  Laterality: N/A;   JOINT REPLACEMENT  08/26/2011   right total knee arthroplasty, left total knee   KNEE  ARTHROPLASTY  10/28/2011   Procedure: COMPUTER ASSISTED TOTAL KNEE ARTHROPLASTY;  Surgeon: Annie Barton Rendall III;  Location: [facility] ORS;  Service: Orthopedics;  Laterality: Left;  preop femoral nerve block   KNEE SURGERY     right   laminectomy and diskectomy     L4-5 with fusion   OPEN REDUCTION INTERNAL FIXATION (ORIF) METACARPAL Left 01/30/2024   Procedure: OPEN REDUCTION INTERNAL FIXATION (ORIF) OF LEFT 5TH METACARPAL SHAFT FRACTURE.;  Surgeon: Elner Hahn, MD;  Location: [facility] ORS;  Service: Orthopedics;  Laterality: Left;   PROCTOSCOPY N/A 06/18/2022   Procedure: RIGID PROCTOSCOPY;  Surgeon: Candyce Champagne, MD;  Location: [facility] ORS;  Service: General;  Laterality:  N/A;    Physical Exam   Triage Vital Signs: ED Triage Vitals  Encounter Vitals Group     BP 03/28/24 1122 (!) 101/59     Systolic BP Percentile --      Diastolic BP Percentile --      Pulse Rate 03/28/24 1122 (!) 48     Resp 03/28/24 1122 17     Temp 03/28/24 1122 98.7 F (37.1 C)     Temp src --      SpO2 03/28/24 1122 92 %     Weight 03/28/24 1121 180 lb (81.6 kg)     Height 03/28/24 1121 5\' 7"  (1.702 m)     Head Circumference --      Peak Flow --      Pain Score 03/28/24 1121 8     Pain Loc --      Pain Education --      Exclude from Growth Chart --     Most recent vital signs: Vitals:   03/28/24 1400 03/28/24 1430  BP: (!) 189/92 (!) 195/95  Pulse: (!) 49 (!) 57  Resp: (!) 27 20  Temp:    SpO2: 97% 96%    General: Awake, no distress.  CV:  Good peripheral perfusion.  Bradycardia heart rate 55.  Normal rhythm, normal distal pulses Resp:  Normal effort.  Clear to auscultation bilaterally Abd:  No distention.  Soft with pronounced epigastric tenderness Other:  Dry oral mucosa   ED Results / Procedures / Treatments   Labs (all labs ordered are listed, but only abnormal results are displayed) Labs Reviewed  BASIC METABOLIC PANEL WITH GFR - Abnormal; Notable for the following components:      Result Value   Sodium 130 (*)    CO2 21 (*)    Glucose, Bld 116 (*)    All other components within normal limits  CBC - Abnormal; Notable for the following components:   WBC 11.8 (*)    All other components within normal limits  LIPASE, BLOOD - Abnormal; Notable for the following components:   Lipase 311 (*)    All other components within normal limits  HEPATIC FUNCTION PANEL - Abnormal; Notable for the following components:   Total Protein 6.3 (*)    Alkaline Phosphatase 18 (*)    All other components within normal limits  LIPID PANEL - Abnormal; Notable for the following components:   HDL 39 (*)    All other components within normal limits  IGG 1, 2, 3, AND 4   TROPONIN I (HIGH SENSITIVITY)  TROPONIN I (HIGH SENSITIVITY)     EKG Interpreted by me Sinus bradycardia rate of 46.  Normal axis, normal intervals.  Normal QRS ST segments and T waves.   RADIOLOGY Chest x-ray interpreted by me, unremarkable.  Radiology report  reviewed   PROCEDURES:  Procedures   MEDICATIONS ORDERED IN ED: Medications  HYDROmorphone (DILAUDID) injection 0.5 mg (has no administration in time range)  cloNIDine (CATAPRES) tablet 0.2 mg (has no administration in time range)  diltiazem (CARDIZEM CD) 24 hr capsule 180 mg (has no administration in time range)  fenofibrate tablet 160 mg (has no administration in time range)  minoxidil (LONITEN) tablet 7.5 mg (has no administration in time range)  irbesartan (AVAPRO) tablet 300 mg (has no administration in time range)  busPIRone (BUSPAR) tablet 30 mg (has no administration in time range)  escitalopram (LEXAPRO) tablet 20 mg (has no administration in time range)  levothyroxine (SYNTHROID) tablet 25 mcg (has no administration in time range)  pantoprazole (PROTONIX) EC tablet 40 mg (has no administration in time range)  gabapentin (NEURONTIN) tablet 800 mg (has no administration in time range)  pramipexole (MIRAPEX) tablet 0.5 mg (has no administration in time range)  albuterol (VENTOLIN HFA) 108 (90 Base) MCG/ACT inhaler 2 puff (has no administration in time range)  fluticasone furoate-vilanterol (BREO ELLIPTA) 200-25 MCG/ACT 1 puff (has no administration in time range)  diphenhydrAMINE (BENADRYL) tablet 50 mg (has no administration in time range)  tiotropium (SPIRIVA) inhalation capsule ([facility] use ONLY) 18 mcg (has no administration in time range)  0.9 %  sodium chloride infusion (has no administration in time range)  ondansetron (ZOFRAN) tablet 4 mg (has no administration in time range)    Or  ondansetron (ZOFRAN) injection 4 mg (has no administration in time range)  sodium chloride 0.9 % bolus 1,000 mL (0 mLs  Intravenous Stopped 03/28/24 1334)  ondansetron (ZOFRAN) injection 4 mg (4 mg Intravenous Given 03/28/24 1157)  morphine (PF) 4 MG/ML injection 4 mg (4 mg Intravenous Given 03/28/24 1158)  pantoprazole (PROTONIX) injection 40 mg (40 mg Intravenous Given 03/28/24 1158)  iohexol (OMNIPAQUE) 300 MG/ML solution 100 mL (100 mLs Intravenous Contrast Given 03/28/24 1259)  morphine (PF) 4 MG/ML injection 4 mg (4 mg Intravenous Given 03/28/24 1359)     IMPRESSION / MDM / ASSESSMENT AND PLAN / ED COURSE  I reviewed the triage vital signs and the nursing notes.  DDx: Pancreatitis, cholecystitis, gastritis, diverticulitis, less likely non-STEMI  Patient's presentation is most consistent with acute presentation with potential threat to life or bodily function.  Patient presents with upper abdominal pain with epigastric tenderness.  EKG is unremarkable except for bradycardia.  Will check labs, chest x-ray, CT abdomen pelvis   Clinical Course as of 03/28/24 1517  Wed Mar 28, 2024  1446 Labs show lipase of 300, LFTs unremarkable.  Troponin negative.  CT shows inflammation of the pancreas on my view without associated gallbladder abnormalities.  Radiology report is pending.  Case discussed with hospitalist for further management. [PS]    Clinical Course User Index [PS] Jacquie Maudlin, MD     FINAL CLINICAL IMPRESSION(S) / ED DIAGNOSES   Final diagnoses:  Acute pancreatitis, unspecified complication status, unspecified pancreatitis type     Rx / DC Orders   ED Discharge Orders     None        Note:  This document was prepared using Dragon voice recognition software and may include unintentional dictation errors.   Jacquie Maudlin, MD 03/28/24 [phone]

## 2024-03-28 NOTE — OR Nursing (Signed)
 Spoke to patient on phone this am who states she has had chest pain for over 4 hours this morning.  Patient states she took her omeprazole and has not had any relief from midsternal, non-radiating chest pain.  Patient advised to have someone drive her to the emergency department for symptom assessment. Verbal agreement by patient. Surgery will be rescheduled at a later time. Surgeon/OR/anesthesia made aware of plan.

## 2024-03-28 NOTE — ED Notes (Addendum)
 O2 sat at 87% on room air. Pt placed on 2L Oxygen at this time. MD made aware.

## 2024-03-28 NOTE — H&P (Signed)
 History and Physical    [name] ZOX:[id number] DOB: 10-26-1956 DOA: 03/28/2024  PCP: Sherron Monday, MD (Confirm with patient/family/NH records and if not entered, this has to be entered at [facility] point of entry) Patient coming from: Home  I have personally briefly reviewed patient's old medical records in [facility]  Chief Complaint: Belly hurts  HPI: [name] is a 68 y.o. female with medical history significant of refractory HTN/chronic HFpEF, CKD stage IIIb, COPD, cigarette smoking, chronic pain syndrome, recurrent UTI on antibiotics suppression, presented with new onset of epigastric pain.  Patient woke up this morning feels 6-7/10 sharp-like epigastric pain, radiating to the back associated with nausea and vomiting of stomach content 2 times nonbilious nonbloody.  No fever or chills.  Pain has been constant, denied any fever chills no abdominal pain no chest pain or shortness of breath.  No history of previous pancreatitis, drink alcohol on weekends. ED Course: Afebrile, blood pressure elevated nonhypoxic.  Lipase 311, AST 18 ALT 12 bilirubin 0.8.  CT abdomen pelvis pending.  Troponin negative x 1.  UA showed 3+ WBC.  Review of Systems: As per HPI otherwise 14 point review of systems negative.    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)    Community acquired pneumonia 01/10/2013   COPD (chronic obstructive pulmonary disease) (HCC)    no inhalers--smoker, no oxygen   Depression    Dyspnea    Emphysema lung (HCC)    Failed back surgical syndrome    GERD (gastroesophageal reflux disease)     Hyperlipidemia    Hypertension    Hypothyroid    Lumbar spondylosis    Nicotine dependence    Normocytic anemia    OSA on CPAP    Peripheral vascular disease (HCC)    Pre-diabetes    Recurrent UTI (urinary tract infection)    Restless leg syndrome    Seizures (HCC)    02/13/2011 -hx of seizure due to "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  no seizure since--pt does take keppra and office note from neurologist dr. Marjory Lies on this chart   Sepsis due to pneumonia [facility])    Sinus tachycardia    Urinary incontinence    Wound of left leg    being seen at wound clinic as of 03-23-24    Past Surgical History:  Procedure Laterality Date   CARPAL TUNNEL RELEASE     left   COLONOSCOPY WITH PROPOFOL N/A 06/17/2022   Procedure: COLONOSCOPY WITH PROPOFOL;  Surgeon: Regis Bill, MD;  Location: [facility] ENDOSCOPY;  Service: Endoscopy;  Laterality: N/A;   JOINT REPLACEMENT  08/26/2011   right total knee arthroplasty, left total knee   KNEE ARTHROPLASTY  10/28/2011   Procedure: COMPUTER ASSISTED TOTAL KNEE ARTHROPLASTY;  Surgeon: Carlisle Beers Rendall III;  Location: [facility] ORS;  Service: Orthopedics;  Laterality: Left;  preop femoral nerve block   KNEE SURGERY     right   laminectomy and diskectomy  L4-5 with fusion   OPEN REDUCTION INTERNAL FIXATION (ORIF) METACARPAL Left 01/30/2024   Procedure: OPEN REDUCTION INTERNAL FIXATION (ORIF) OF LEFT 5TH METACARPAL SHAFT FRACTURE.;  Surgeon: Elner Hahn, MD;  Location: [facility] ORS;  Service: Orthopedics;  Laterality: Left;   PROCTOSCOPY N/A 06/18/2022   Procedure: RIGID PROCTOSCOPY;  Surgeon: Candyce Champagne, MD;  Location: [facility] ORS;  Service: General;  Laterality: N/A;     reports that she has been smoking cigarettes. She has a 100 pack-year smoking history. She has been exposed to tobacco smoke. She has never used smokeless tobacco. She reports current  alcohol use. She reports that she does not use drugs.  Allergies  Allergen Reactions   Nsaids Other (See Comments)    Pt states it messes up her kidneys But takes aspirin & ibuprofen   Thiazide-Type Diuretics     Other reaction(s): Unknown   Hydrocodone Itching    Family History  Problem Relation Age of Onset   Heart attack Father 62   Alzheimer's disease Mother    Breast cancer Neg Hx      Prior to Admission medications   Medication Sig Start Date End Date Taking? Authorizing Provider  acetaminophen (TYLENOL) 650 MG CR tablet Take 1,300 mg by mouth every 8 (eight) hours as needed for pain.    [provider]  acidophilus (RISAQUAD) CAPS capsule Take 1 capsule by mouth daily.    [provider]  albuterol (VENTOLIN HFA) 108 (90 Base) MCG/ACT inhaler Inhale 2 puffs into the lungs every 4 (four) hours as needed for wheezing or shortness of breath. 11/29/22   Rockney Cid, DO  budesonide-formoterol [facility]) 160-4.5 MCG/ACT inhaler Inhale 2 puffs into the lungs 2 (two) times daily.    [provider]  busPIRone (BUSPAR) 30 MG tablet TAKE 1 TABLET BY MOUTH 2 TIMES DAILY. 02/13/24   Tejan-Sie, S Ahmed, MD  ciclopirox (PENLAC) 8 % solution Apply 1 application  topically at bedtime. 01/25/24   [provider]  cloNIDine (CATAPRES) 0.2 MG tablet TAKE 1 TABLET BY MOUTH 2 TIMES DAILY. 01/30/24   Tejan-Sie, S Ahmed, MD  diltiazem (CARDIZEM CD) 180 MG 24 hr capsule Take 180 mg by mouth every morning.    [provider]  diphenhydrAMINE (BENADRYL) 25 MG tablet Take 50 mg by mouth daily.    [provider]  escitalopram (LEXAPRO) 20 MG tablet Take 1 tablet (20 mg total) by mouth daily. Patient taking differently: Take 20 mg by mouth every morning. 02/03/24   Tejan-Sie, S Ahmed, MD  fenofibrate micronized (LOFIBRA) 134 MG capsule Take 1 capsule (134 mg total) by mouth daily before breakfast. 03/21/24   Shari Daughters, MD  fluticasone  (FLONASE) 50 MCG/ACT nasal spray Place 1 spray into both nostrils daily. Patient taking differently: Place 1 spray into both nostrils daily as needed for allergies. 02/03/24 05/03/24  Shari Daughters, MD  gabapentin (NEURONTIN) 800 MG tablet Take 1 tablet (800 mg total) by mouth 3 (three) times daily. 02/03/24 05/03/24  Shari Daughters, MD  hydrALAZINE (APRESOLINE) 10 MG tablet Take 1 tablet (10 mg total) by mouth 3 (three) times daily as needed. Please take if your blood pressure is above 150/90. You can repeat the dose as needed up with up to 3 pills per day. If your blood pressure is not improving with this, please call your doctor's office. Patient not taking: Reported on 03/22/2024 05/24/23   Mecum, Erin E, PA-C  levothyroxine (SYNTHROID) 25 MCG tablet TAKE 1 TABLET  BY MOUTH DAILY BEFORE BREAKFAST. 01/06/24   Shari Daughters, MD  minoxidil (LONITEN) 2.5 MG tablet Take 3 tablets (7.5 mg total) by mouth daily. 02/03/24 05/03/24  Shari Daughters, MD  NON FORMULARY Pt uses a c-pap nightly w/ oxygen    [provider]  olmesartan (BENICAR) 40 MG tablet Take 40 mg by mouth every morning.    [provider]  oxyCODONE-acetaminophen (PERCOCET) 5-325 MG tablet Take 1 tablet by mouth every 4 (four) hours as needed for moderate pain (pain score 4-6) or severe pain (pain score 7-10). Patient not taking: Reported on 03/22/2024 01/30/24 01/29/25  Poggi, Kaylene Pascal, MD  OXYGEN Inhale 3 L into the lungs at bedtime.    [provider]  pantoprazole (PROTONIX) 40 MG tablet Take 1 tablet (40 mg total) by mouth every morning. 01/31/24 04/30/24  Shari Daughters, MD  pramipexole (MIRAPEX) 0.5 MG tablet TAKE 1 TABLET BY MOUTH AT BEDTIME. 01/09/24   Tejan-Sie, S Ahmed, MD  SPIRIVA HANDIHALER 18 MCG inhalation capsule Place 18 mcg into inhaler and inhale daily. 05/02/23   [provider]  trimethoprim (TRIMPEX) 100 MG tablet Take 1 tablet (100 mg total) by mouth daily. Patient taking  differently: Take 100 mg by mouth every morning. 10/17/23   Erman Hayward, MD    Physical Exam: Vitals:   03/28/24 1400 03/28/24 1430 03/28/24 1531 03/28/24 1540  BP: (!) 189/92 (!) 195/95  (!) 213/93  Pulse: (!) 49 (!) 57    Resp: (!) 27 20    Temp:   97.9 F (36.6 C)   SpO2: 97% 96%    Weight:      Height:        Constitutional: NAD, calm, comfortable Vitals:   03/28/24 1400 03/28/24 1430 03/28/24 1531 03/28/24 1540  BP: (!) 189/92 (!) 195/95  (!) 213/93  Pulse: (!) 49 (!) 57    Resp: (!) 27 20    Temp:   97.9 F (36.6 C)   SpO2: 97% 96%    Weight:      Height:       Eyes: PERRL, lids and conjunctivae normal ENMT: Mucous membranes are moist. Posterior pharynx clear of any exudate or lesions.Normal dentition.  Neck: normal, supple, no masses, no thyromegaly Respiratory: clear to auscultation bilaterally, no wheezing, no crackles. Normal respiratory effort. No accessory muscle use.  Cardiovascular: Regular rate and rhythm, no murmurs / rubs / gallops. No extremity edema. 2+ pedal pulses. No carotid bruits.  Abdomen: Tenderness on abdominal epigastric area no rebound no guarding, no masses palpated. No hepatosplenomegaly. Bowel sounds positive.  Musculoskeletal: no clubbing / cyanosis. No joint deformity upper and lower extremities. Good ROM, no contractures. Normal muscle tone.  Skin: no rashes, lesions, ulcers. No induration Neurologic: CN 2-12 grossly intact. Sensation intact, DTR normal. Strength 5/5 in all 4.  Psychiatric: Normal judgment and insight. Alert and oriented x 3. Normal mood.    Labs on Admission: I have personally reviewed following labs and imaging studies  CBC: Recent Labs  Lab 03/28/24 1125  WBC 11.8*  HGB 13.3  HCT 40.4  MCV 95.3  PLT 295   Basic Metabolic Panel: Recent Labs  Lab 03/28/24 1125  NA 130*  K 3.9  CL 103  CO2 21*  GLUCOSE 116*  BUN 13  CREATININE 0.80  CALCIUM 9.1   GFR: Estimated Creatinine Clearance: 75 mL/min  (by C-G formula based on SCr of 0.8 mg/dL). Liver Function Tests: Recent Labs  Lab 03/28/24 1125  AST 18  ALT 12  ALKPHOS 18*  BILITOT 0.5  PROT 6.3*  ALBUMIN 3.5   Recent Labs  Lab 03/28/24 1125  LIPASE 311*   No results for input(s): "AMMONIA" in the last 168 hours. Coagulation Profile: No results for input(s): "INR", "PROTIME" in the last 168 hours. Cardiac Enzymes: No results for input(s): "CKTOTAL", "CKMB", "CKMBINDEX", "TROPONINI" in the last 168 hours. BNP (last 3 results) No results for input(s): "PROBNP" in the last 8760 hours. HbA1C: No results for input(s): "HGBA1C" in the last 72 hours. CBG: No results for input(s): "GLUCAP" in the last 168 hours. Lipid Profile: Recent Labs    03/28/24 1334  CHOL 121  HDL 39*  LDLCALC 58  TRIG 161  CHOLHDL 3.1   Thyroid Function Tests: No results for input(s): "TSH", "T4TOTAL", "FREET4", "T3FREE", "THYROIDAB" in the last 72 hours. Anemia Panel: No results for input(s): "VITAMINB12", "FOLATE", "FERRITIN", "TIBC", "IRON", "RETICCTPCT" in the last 72 hours. Urine analysis:    Component Value Date/Time   COLORURINE YELLOW (A) 07/06/2022 2010   APPEARANCEUR Cloudy (A) 10/17/2023 1440   LABSPEC 1.015 07/06/2022 2010   PHURINE 6.0 07/06/2022 2010   GLUCOSEU 2+ (A) 10/17/2023 1440   HGBUR MODERATE (A) 07/06/2022 2010   BILIRUBINUR negative 03/08/2024 1459   BILIRUBINUR Negative 10/17/2023 1440   KETONESUR NEGATIVE 07/06/2022 2010   PROTEINUR Negative 03/08/2024 1459   PROTEINUR Negative 10/17/2023 1440   PROTEINUR 100 (A) 07/06/2022 2010   UROBILINOGEN 0.2 (A) 03/08/2024 1459   UROBILINOGEN 0.2 04/14/2014 1217   NITRITE Positive 03/08/2024 1459   NITRITE Negative 10/17/2023 1440   NITRITE POSITIVE (A) 07/06/2022 2010   LEUKOCYTESUR Large (3+) (A) 03/08/2024 1459   LEUKOCYTESUR 1+ (A) 10/17/2023 1440   LEUKOCYTESUR LARGE (A) 07/06/2022 2010    Radiological Exams on Admission: No results found.  EKG:  Independently reviewed.  Sinus, no acute ST changes.  Assessment/Plan Principal Problem:   Pancreatitis Active Problems:   Pancreatitis, acute  (please populate well all problems here in Problem List. (For example, if patient is on BP meds at home and you resume or decide to hold them, it is a problem that needs to be her. Same for CAD, COPD, HLD and so on)  Acute pancreatitis - Unknown etiology, normal LFTs arguing against gallstone pancreatitis.  CT abdomen pelvis pending.  Depends on the reading of CT abdomen pelvis will decide whether to order MRCP. -N.p.o., IVF and as needed Dilaudid - Other DDx, we will check lipid panel and IgG family\  Recurrent UTIs failed outpatient management - Patient complaining about burning sensations, - Switch home antibiotic regimen of trimethoprim  HTN emergency History of refractory HTN - Resume home BP meds including clonidine, Cardizem, irbesartan, minoxidil  History of COPD - No acute concern, continue as needed breathing medications - Symbicort  Anxiety/depression - Continue Lexapro    DVT prophylaxis: SCD Code Status: Full code Family Communication: Husband at bedside Disposition Plan: Patient is sick with acute pancreatitis requiring IV fluid IV pain medications, expect more than 2 midnight hospital stay Consults called: None Admission status: MedSurg admission   [place] MD [facility] Pager [phone] 03/28/2024, 3:45 PM

## 2024-03-28 NOTE — ED Triage Notes (Signed)
 Patient to ED via [facility] from home for CP. Started this AM. Given 1 spray of nitroglycerin by EMS and 1 nitroglycerin pill of husbands prior to EMS. HX of stage 3 kidney disease, HTN, and CHF.

## 2024-03-28 NOTE — H&P (Signed)
 History of Present Illness:  [name] is a 68 y.o. female who presents for follow-up now 6 weeks status post an intramedullary fixation of a displaced distal third left fifth metacarpal shaft fracture. Overall, the patient feels that she is doing reasonably well. She still notes moderate discomfort in her hand which she rates at 6/10 on today's visit. She claims that she has been wearing her splint at all times and not removing it, although it has been documented previously that she has not been the most compliant of patients. She has been taking Neurontin and an occasional oxycodone as necessary with temporary partial relief of her symptoms. The patient denies any recent reinjury to the hand, and denies any fevers or chills. She also denies any numbness or paresthesias to her fingertips.  Current Outpatient Medications:  albuterol (PROVENTIL) 2.5 mg /3 mL (0.083 %) nebulizer solution Take 3 mLs (2.5 mg total) by nebulization as directed for Wheezing Inhale 1 vial via nebulizer every 4-6 hrs as needed. 75 mL 3  albuterol MDI, PROVENTIL, VENTOLIN, PROAIR, HFA 90 mcg/actuation inhaler Inhale 2 inhalations into the lungs every 6 (six) hours as needed for Wheezing 3 each 1  budesonide-formoteroL (SYMBICORT) 160-4.5 mcg/actuation inhaler Inhale 2 inhalations into the lungs 2 (two) times daily 30.6 g 1  busPIRone (BUSPAR) 30 MG tablet Take 30 mg by mouth 2 (two) times daily  butalbital-acetaminophen-caffeine (FIORICET) 50-325-40 mg tablet Take 1 tablet by mouth every 6 (six) hours as needed for Pain  ciclopirox (PENLAC) 8 % topical nail solution Apply topically at bedtime Apply over nail and surrounding skin. 6.6 mL 2  ciprofloxacin HCl (CIPRO) 500 MG tablet Take 500 mg by mouth 2 (two) times daily  cloNIDine (CATAPRES-TTS) 0.2 mg/24 hr patch Place 2 patches onto the skin every 7 (seven) days  cloNIDine HCL (CATAPRES) 0.2 MG tablet Take 0.2 mg by mouth 2 (two) times daily  escitalopram oxalate (LEXAPRO)  20 MG tablet  famotidine (PEPCID) 40 MG tablet  FARXIGA 10 mg tablet Take 10 mg by mouth once daily  fenofibrate micronized (LOFIBRA) 134 MG capsule TAKE 1 CAPSULE BY MOUTH DAILY WITH A MEAL  fluticasone propionate (FLONASE) 50 mcg/actuation nasal spray Place 1 spray into one nostril once daily  gabapentin (NEURONTIN) 800 MG tablet Take by mouth  hydrALAZINE (APRESOLINE) 10 MG tablet Take 10 mg by mouth 3 (three) times daily as needed (PLEASE TAKE IF BLOOD PRESSURE IS ABOVE 150/90. CAN REPEAT DOSE AS NEEDED UP TO 3 PER DAY)  L. acidophilus/Bifid. animalis 32 billion cell Cap Take 1 capsule by mouth once daily  levothyroxine (SYNTHROID) 25 MCG tablet  lidocaine-prilocaine (EMLA) cream Apply 2 to 3 grams (1 gram = 1 inch) to the affected area 4 times daily  minoxidiL 2.5 MG tablet Take 7.5 mg by mouth once daily  mupirocin (BACTROBAN) 2 % ointment Apply topically 2 (two) times daily FOR 7 DAYS  olmesartan (BENICAR) 40 MG tablet Take 1 tablet by mouth once daily  oxyCODONE (ROXICODONE) 5 MG immediate release tablet Take 1 tablet (5 mg total) by mouth every 6 (six) hours as needed for Pain 30 tablet 0  pantoprazole (PROTONIX) 40 MG DR tablet  pramipexole (MIRAPEX) 0.5 MG tablet Take 0.5 mg by mouth at bedtime  terBINafine HCL (LAMISIL) 250 mg tablet Take 1 tablet (250 mg total) by mouth once daily for 42 days 42 tablet 0  tiotropium bromide (SPIRIVA RESPIMAT) 2.5 mcg/actuation inhalation spray Inhale 2 inhalations (5 mcg total) into the lungs once daily 4  g 8  traZODone (DESYREL) 50 MG tablet Take 0.5-1 tablets by mouth at bedtime as needed  trimethoprim 100 mg tablet Take 100 mg by mouth once daily  UBRELVY 100 mg Tab Take 1 tablet by mouth once daily as needed (FOR HEADACHE. MAX 2 TABLETS DAILY)  urea (CEROVEL) 40 % topical cream Apply topically 2 (two) times daily Apply to thickened nails 2x daily 90 g 2  dilTIAZem (CARDIZEM CD) 180 MG CD capsule Take 1 capsule (180 mg total) by mouth once daily  30 capsule 11   Allergies:  Acetaminophen Other (Kidney issues) Aspirin Other (Dr. York Spaniel do not take due to kidneys)  NSAIDS (Pt states it messes up her kidneys)  Hydrocodone-Acetaminophen (Nausea And Vomiting, Unknown)  [place]-Hydran Other (See Comments)  Thiazides Other (Unknown)  Hydrocodone Itching   Past Medical History:  Allergy  Anxiety  Arthritis  CHF exacerbation (CMS/HHS-HCC) 12/28/2019  COPD (chronic obstructive pulmonary disease) (CMS/HHS-HCC)  Diabetes mellitus, type 2 (CMS/HHS-HCC) 01/10/2013  GERD (gastroesophageal reflux disease)  Hypertension  Incontinence  Obesity  Sinusitis, unspecified  Thyroid disease   Past Surgical History:  Colon @ [facility] 06/17/2022 (Tubular adenomas/Flex Sig repeat 6 months/CTL)  ORIF Left 5th Metacarpal fracture 01/30/2024 (Dr. Joice Lofts)  Back Surgery - 8 infusions  BRONCHOSCOPY  Left Hand Carpal Tunnel Release  REPLACEMENT TOTAL KNEE BILATERAL   Family History:  Skin cancer Mother Darcas Vanecek  Bronchiolitis Mother Shilee Biggs  Coronary Artery Disease (Blocked arteries around heart) Father  Hyperlipidemia (Elevated cholesterol) Sister Jerold Coombe  High blood pressure (Hypertension) Sister Jerold Coombe  Baby  Diabetes Sister Jerold Coombe  COPD Sister Jerold Coombe  Emphysema Brother Orbie Pyo   Social History:   Socioeconomic History:  Marital status: Widowed  Tobacco Use  Smoking status: Every Day  Current packs/day: 1.50  Average packs/day: 1.5 packs/day for 50.0 years (75.0 ttl pk-yrs)  Types: Cigarettes  Smokeless tobacco: Never  Tobacco comments:  10/04/23- 1 pack of cigarettes BVM  Vaping Use  Vaping status: Never Used  Substance and Sexual Activity  Alcohol use: Not Currently  Alcohol/week: 2.0 standard drinks of alcohol  Types: 2 Glasses of wine per week  Drug use: Never  Sexual activity: Yes  Partners: Male  Birth control/protection: Other-see comments, Post-menopausal    Social Drivers of Health:   Physicist, medical Strain: Low Risk (03/09/2024)  Overall Financial Resource Strain (CARDIA)  Difficulty of Paying Living Expenses: Not hard at all  Food Insecurity: No Food Insecurity (03/09/2024)  Hunger Vital Sign  Worried About Running Out of Food in the Last Year: Never true  Ran Out of Food in the Last Year: Never true  Transportation Needs: No Transportation Needs (03/09/2024)  PRAPARE - Risk analyst (Medical): No  Lack of Transportation (Non-Medical): No  Recent Concern: Transportation Needs - Unmet Transportation Needs (01/25/2024)  PRAPARE - Risk analyst (Medical): Yes  Lack of Transportation (Non-Medical): Yes   Review of Systems:  A comprehensive 14 point ROS was performed, reviewed, and the pertinent orthopaedic findings are documented in the HPI.  Physical Exam: Vitals:  03/12/24 0928 03/12/24 0929  BP: 136/78  Weight: 81.6 kg (179 lb 14.3 oz)  Height: 167.6 cm (5\' 6" )  PainSc: 6 6  PainLoc: Hand Hand   General/Constitutional: The patient appears to be well-nourished, well-developed, and in no acute distress. Neuro/Psych: Normal mood and affect, oriented to person, place and time. Eyes: Non-icteric. Pupils are equal, round, and reactive to light, and exhibit synchronous  movement. ENT: Unremarkable. Lymphatic: No palpable adenopathy. Respiratory: Lungs clear to auscultation, Normal chest excursion, No wheezes, and Non-labored breathing Cardiovascular: Regular rate and rhythm. No murmurs. and No edema, swelling or tenderness, except as noted in detailed exam. Integumentary: No impressive skin lesions present, except as noted in detailed exam. Musculoskeletal: Unremarkable, except as noted in detailed exam.  Left hand exam: Skin inspection of the left hand demonstrates her surgical incision to be well-healed and without evidence for infection. No swelling, erythema, ecchymosis,  abrasions, or other skin abnormalities are identified. Her left fifth metacarpal and left fifth digit both appear to be well aligned and without any rotational deformity. She is able to actively flex and extend all digits without any pain or triggering, although she is limited in her ability to flex the little MCP and PIP joints. MCP motion is approximately 0 to 50 degrees whereas the PIP motion is 0 to 75 degrees. She is neurovascularly intact to all digits.  X-rays/MRI/Lab data:  AP, lateral, and oblique views of the left hand are obtained. These films demonstrate satisfactory alignment of the fifth metacarpal with evidence of good interval healing of the fracture. The intramedullary screw appears to be in satisfactory position, although it is somewhat prominent at the metacarpal head. This position is unchanged as compared to her prior films from 4 weeks ago.  Assessment: S/P ORIF (open reduction internal fixation) fracture.  Closed displaced fracture of shaft of fifth metacarpal bone of left hand.   Plan: The treatment options were discussed with the patient. In addition, patient educational materials were provided regarding the diagnosis and treatment options. The patient is frustrated by her continued symptoms and functional limitations. Given the now prominent intramedullary screw position at the MCP joint, I feel that the best course of action at this point would be to remove the screw. Therefore, I have recommended that we proceed with the surgical procedure with which the patient is in agreement. The procedure was discussed with the patient, as were the potential risks (including bleeding, infection, nerve and/or blood vessel injury, persistent or recurrent pain, recurrent fracture, malunion or nonunion, stiffness of the fifth MCP and/or PIP joints development of degenerative joint disease,, need for further surgery, blood clots, strokes, heart attacks and/or arhythmias, pneumonia, etc.) and  benefits. The patient states her understanding and wishes to proceed. All of the patient's questions and concerns were answered. She can call any time with further concerns. She will follow up post-surgery, routine.    H&P reviewed and patient re-examined. No changes.

## 2024-03-28 NOTE — ED Notes (Signed)
 Pt transferred to [facility] with minimal assistance at this time.

## 2024-03-29 DIAGNOSIS — K8500 Idiopathic acute pancreatitis without necrosis or infection: Secondary | ICD-10-CM

## 2024-03-29 LAB — COMPREHENSIVE METABOLIC PANEL WITH GFR
ALT: 11 U/L (ref 0–44)
AST: 13 U/L — ABNORMAL LOW (ref 15–41)
Albumin: 3.4 g/dL — ABNORMAL LOW (ref 3.5–5.0)
Alkaline Phosphatase: 19 U/L — ABNORMAL LOW (ref 38–126)
Anion gap: 6 (ref 5–15)
BUN: 9 mg/dL (ref 8–23)
CO2: 25 mmol/L (ref 22–32)
Calcium: 9.4 mg/dL (ref 8.9–10.3)
Chloride: 101 mmol/L (ref 98–111)
Creatinine, Ser: 0.79 mg/dL (ref 0.44–1.00)
GFR, Estimated: >60 mL/min (ref >60)
Glucose, Bld: 87 mg/dL (ref 70–99)
Potassium: 4.1 mmol/L (ref 3.5–5.1)
Sodium: 132 mmol/L — ABNORMAL LOW (ref 135–145)
Total Bilirubin: 0.6 mg/dL (ref 0.0–1.2)
Total Protein: 6.2 g/dL — ABNORMAL LOW (ref 6.5–8.1)

## 2024-03-29 LAB — CBC WITH DIFFERENTIAL/PLATELET
Abs Immature Granulocytes: 0.03 10*3/uL (ref 0.00–0.07)
Basophils Absolute: 0 10*3/uL (ref 0.0–0.1)
Basophils Relative: 0 %
Eosinophils Absolute: 0.2 10*3/uL (ref 0.0–0.5)
Eosinophils Relative: 2 %
HCT: 41 % (ref 36.0–46.0)
Hemoglobin: 13.9 g/dL (ref 12.0–15.0)
Immature Granulocytes: 0 %
Lymphocytes Relative: 8 %
Lymphs Abs: 0.8 10*3/uL (ref 0.7–4.0)
MCH: 32 pg (ref 26.0–34.0)
MCHC: 33.9 g/dL (ref 30.0–36.0)
MCV: 94.3 fL (ref 80.0–100.0)
Monocytes Absolute: 0.6 10*3/uL (ref 0.1–1.0)
Monocytes Relative: 6 %
Neutro Abs: 9.2 10*3/uL — ABNORMAL HIGH (ref 1.7–7.7)
Neutrophils Relative %: 84 %
Platelets: 264 10*3/uL (ref 150–400)
RBC: 4.35 MIL/uL (ref 3.87–5.11)
RDW: 13.1 % (ref 11.5–15.5)
WBC: 10.8 10*3/uL — ABNORMAL HIGH (ref 4.0–10.5)
nRBC: 0 % (ref 0.0–0.2)

## 2024-03-29 LAB — LIPASE, BLOOD: Lipase: 400 U/L — ABNORMAL HIGH (ref 11–51)

## 2024-03-29 MED ORDER — HEPARIN SODIUM (PORCINE) 5000 UNIT/ML IJ SOLN
5000.0000 [IU] | Freq: Two times a day (BID) | INTRAMUSCULAR | Status: DC
  Administered 2024-03-29 – 2024-03-31 (×4): 5000 [IU] via SUBCUTANEOUS
  Filled 2024-03-29 (×4): qty 1

## 2024-03-29 MED ORDER — NICOTINE 14 MG/24HR TD PT24
14.0000 mg | MEDICATED_PATCH | Freq: Every day | TRANSDERMAL | Status: DC
  Administered 2024-03-29 – 2024-03-31 (×3): 14 mg via TRANSDERMAL
  Filled 2024-03-29 (×3): qty 1

## 2024-03-29 NOTE — Progress Notes (Addendum)
 PROGRESS NOTE    [name]  ZOX:[id number] DOB: [birth year] DOA: 03/28/2024 PCP: Sherron Monday, MD  Chief Complaint  Patient presents with   Chest Pain    Hospital Course:  [name] is 68 y.o. female with refractory hypertension, chronic HFpEF, CKD stage IIIb, COPD, cigarette smoking, chronic pain syndrome, recurrent UTI on suppressive antibiotics, presents with new epigastric pain.  In the ED patient was hypertensive, afebrile, not hypoxic.  Lipase 311, AST 18, ALT 12, bilirubin 0.8.  Opponent negative x 1.  UA with 3+ WBC.  CT scan reveals significant stranding surrounding the pancreas with edematous appearance to the head, neck, body of the pancreas.  CT reveals mild biliary duct ectasia with distended gallbladder.  Subjective: Pt reports feeling much better and endorses an appetite.  Has not required pain meds today RN Reports desaturation episode overnight and was placed on oxygen.  Patient reports she has obstructive sleep apnea and has been to be on CPAP.  Objective: Vitals:   03/28/24 1952 03/29/24 0338 03/29/24 0628 03/29/24 0751  BP: (!) 156/74 (!) 167/69 (!) 135/54 (!) 164/72  Pulse: 66 69 65 69  Resp: 19 20  19   Temp: 98.3 F (36.8 C) 98.6 F (37 C)  98.8 F (37.1 C)  TempSrc: Oral Oral  Oral  SpO2: 94% 95% 96% 96%  Weight:      Height:        Intake/Output Summary (Last 24 hours) at 03/29/2024 0911 Last data filed at 03/29/2024 0600 Gross per 24 hour  Intake 752.38 ml  Output --  Net 752.38 ml   Filed Weights   03/28/24 1121 03/28/24 1706  Weight: 81.6 kg 81.6 kg    Examination: General exam: Appears calm and comfortable, NAD  Respiratory system: No work of breathing, symmetric chest wall expansion Cardiovascular system: S1 & S2 heard, RRR.  Gastrointestinal system: Abdomen is nondistended, soft, minimally tender to palpation in epigastrium Neuro: Alert and oriented. No focal neurological deficits. Extremities: Symmetric, expected ROM Skin:  No rashes, lesions Psychiatry: Demonstrates appropriate judgement and insight. Mood & affect appropriate for situation.   Assessment & Plan:  Principal Problem:   Pancreatitis Active Problems:   Pancreatitis, acute    Acute pancreatitis - Lipids within normal limits, last drink over 2 weeks ago, not a daily drinker.  CT does reveal distended gallbladder, possibility of passed stone. - Normal LFTs now, will not pursue an MRCP unless pain recurs - Patient endorsing appetite and is tolerating clears today.  Will advance to regular. - Continue with as needed pain meds  Recurrent UTIs - On suppressive antibiotic therapy at home - Started on ceftriaxone during this admission due to complaints of dysuria.  Hypertension - History of refractory hypertension - At home on clonidine, Cardizem, irbesartan, minoxidil. Resume all  History of COPD, no acute exacerbation - Continue with as needed nebs, home meds  Anxiety Depression - Continue home dose Lexapro  CKD stage IIIb - Follows with nephrology outpatient.  Baseline GFR 46 - CT on admission shows severe atrophy of right kidney and mild atrophy of left kidney - Creatinine currently 0.8, better than baseline - Avoid nephrotoxic meds - Trend creatinine closely  Type 2 diabetes, with CKD - Hemoglobin A1c 6 months ago 5.4% - Continue daily monitoring of blood glucose on CMP.  No indication for insulin at this time.  Tobacco abuse - Counseled on cessation  OSA -- CPAP at bedtime ordered  DVT prophylaxis: Heparin   Code Status:  Full Code Family Communication:  Discussed directly with patient Disposition: Inpatient, still hospitalized for pain control and resolving pancreatitis.  Will discharge directly to home Consultants:    Procedures:    Antimicrobials:  Anti-infectives (From admission, onward)    Start     Dose/Rate Route Frequency Ordered Stop   03/28/24 1600  cefTRIAXone (ROCEPHIN) 1 g in sodium chloride 0.9 % 100  mL IVPB        1 g 200 mL/hr over 30 Minutes Intravenous Every 24 hours 03/28/24 1554         Data Reviewed: I have personally reviewed following labs and imaging studies CBC: Recent Labs  Lab 03/28/24 1125  WBC 11.8*  HGB 13.3  HCT 40.4  MCV 95.3  PLT 295   Basic Metabolic Panel: Recent Labs  Lab 03/28/24 1125  NA 130*  K 3.9  CL 103  CO2 21*  GLUCOSE 116*  BUN 13  CREATININE 0.80  CALCIUM 9.1   GFR: Estimated Creatinine Clearance: 75 mL/min (by C-G formula based on SCr of 0.8 mg/dL). Liver Function Tests: Recent Labs  Lab 03/28/24 1125  AST 18  ALT 12  ALKPHOS 18*  BILITOT 0.5  PROT 6.3*  ALBUMIN 3.5   CBG: No results for input(s): "GLUCAP" in the last 168 hours.  No results found for this or any previous visit (from the past 240 hours).   Radiology Studies: CT ABDOMEN PELVIS W CONTRAST Result Date: 03/28/2024 CLINICAL DATA:  Acute abdominal pain EXAM: CT ABDOMEN AND PELVIS WITH CONTRAST TECHNIQUE: Multidetector CT imaging of the abdomen and pelvis was performed using the standard protocol following bolus administration of intravenous contrast. RADIATION DOSE REDUCTION: This exam was performed according to the departmental dose-optimization program which includes automated exposure control, adjustment of the mA and/or kV according to patient size and/or use of iterative reconstruction technique. CONTRAST:  OMNIPAQUE IOHEXOL 300 MG/ML  SOLN COMPARISON:  Noncontrast CT chest abdomen pelvis 03/13/2023. FINDINGS: Lower chest: There is some linear opacity lung bases likely scar or atelectasis. No pleural effusion. Heart is slightly enlarged. Coronary artery calcifications are seen. Hepatobiliary: Mild ectasia of the intrahepatic biliary tree. Patent portal vein. Gallbladder is distended. No space-occupying liver lesion. Pancreas: There is significant stranding surrounding the pancreas. There is also a slightly edematous appearance to the head, neck and body of  the pancreas. Please correlate for clinical evidence of pancreatitis. Pancreas appears to be enhancing. No well-defined fluid collections at this time. Spleen: Small low-attenuation lesion posterior along the spleen on series 2, image 16 measures 6 mm. Nonspecific. Separate small splenule. Adrenals/Urinary Tract: Adrenal glands are preserved. There is severe atrophy of the right kidney and mild of the left. No enhancing renal mass or collecting system dilatation. Previously there is some ectasia of the right renal collecting system not seen today. Preserved contour to the urinary bladder. Stomach/Bowel: Large bowel demonstrates some surgical changes along the rectum. Please correlate with history. Large bowel is nondilated. Scattered colonic stool. Normal appendix in the right lower quadrant. Normal retrocecal appendix. Stomach is underdistended. Small bowel is nondilated. Vascular/Lymphatic: Scattered vascular calcifications along the aorta and branch vessels. This includes the iliac vessels. Normal caliber aorta and IVC. No specific abnormal lymph node enlargement identified in the abdomen and pelvis. Reproductive: Uterus is present.  No separate adnexal mass. Other: Trace free fluid in the dependent pelvis. No free intra-abdominal air clearly identified. Musculoskeletal: Significant streak artifact related to the spinal fixation hardware in the lumbar spine. Laminectomy changes. There  is multifocal degenerative changes. Curvature of the spine. Multilevel stenosis. Curvature of the spine as well. Degenerative changes of the pelvis. IMPRESSION: Significant phlegm a tori stranding surrounding the pancreas with some pancreatic edema. Please correlate with any clinical findings of pancreatitis. No well-defined fluid collections. Splenic vein is patent. Mild biliary duct ectasia with distended gallbladder. Further workup when appropriate. Small amount of ascites. Electronically Signed   By: Adrianna Horde M.D.   On:  03/28/2024 17:24   DG Chest 2 View Result Date: 03/28/2024 CLINICAL DATA:  Chest pain. EXAM: CHEST - 2 VIEW COMPARISON:  05/26/2023, 03/13/2023, 12/03/2022, 07/06/2022 FINDINGS: Cardiomediastinal silhouette and pulmonary vasculature are within normal limits. Mild prominence of the hila appear similar on multiple prior exams. Lungs are clear. IMPRESSION: No acute cardiopulmonary process. Electronically Signed   By: Elester Grim M.D.   On: 03/28/2024 16:12    Scheduled Meds:  busPIRone  30 mg Oral BID   cloNIDine  0.2 mg Oral BID   diltiazem  180 mg Oral Daily   escitalopram  20 mg Oral Daily   fenofibrate  160 mg Oral Daily   fluticasone furoate-vilanterol  1 puff Inhalation Daily   gabapentin  800 mg Oral TID   irbesartan  300 mg Oral Daily   levothyroxine  25 mcg Oral QAC breakfast   minoxidil  7.5 mg Oral Daily   pantoprazole  40 mg Oral q morning   pramipexole  0.5 mg Oral QHS   umeclidinium bromide  1 puff Inhalation Daily   Continuous Infusions:  sodium chloride 125 mL/hr at 03/29/24 0828   cefTRIAXone (ROCEPHIN)  IV 1 g (03/28/24 1740)     LOS: 1 day  MDM: Patient is high risk for one or more organ failure.  They necessitate ongoing hospitalization for continued IV therapies and subsequent lab monitoring. Total time spent interpreting labs and vitals, coordinating care amongst consultants and care team members, directly assessing and discussing care with the patient and/or family: 55 min    Delenn Ahn, DO [facility]  To contact the attending physician between 7A-7P please use Epic Chat. To contact the covering physician during after hours 7P-7A, please review Amion.   03/29/2024, 9:11 AM   *This document has been created with the assistance of dictation software. Please excuse typographical errors. *

## 2024-03-29 NOTE — Plan of Care (Signed)
°  Problem: Clinical Measurements: Goal: Will remain free from infection Outcome: Progressing   Problem: Activity: Goal: Risk for activity intolerance will decrease Outcome: Progressing   Problem: Nutrition: Goal: Adequate nutrition will be maintained Outcome: Progressing

## 2024-03-30 ENCOUNTER — Ambulatory Visit

## 2024-03-30 ENCOUNTER — Inpatient Hospital Stay

## 2024-03-30 DIAGNOSIS — J9601 Acute respiratory failure with hypoxia: Secondary | ICD-10-CM | POA: Diagnosis not present

## 2024-03-30 DIAGNOSIS — K8500 Idiopathic acute pancreatitis without necrosis or infection: Secondary | ICD-10-CM | POA: Diagnosis not present

## 2024-03-30 LAB — CBC WITH DIFFERENTIAL/PLATELET
Abs Immature Granulocytes: 0.04 10*3/uL (ref 0.00–0.07)
Basophils Absolute: 0 10*3/uL (ref 0.0–0.1)
Basophils Relative: 0 %
Eosinophils Absolute: 0.2 10*3/uL (ref 0.0–0.5)
Eosinophils Relative: 2 %
HCT: 38.7 % (ref 36.0–46.0)
Hemoglobin: 13 g/dL (ref 12.0–15.0)
Immature Granulocytes: 0 %
Lymphocytes Relative: 12 %
Lymphs Abs: 1 10*3/uL (ref 0.7–4.0)
MCH: 31.9 pg (ref 26.0–34.0)
MCHC: 33.6 g/dL (ref 30.0–36.0)
MCV: 95.1 fL (ref 80.0–100.0)
Monocytes Absolute: 0.5 10*3/uL (ref 0.1–1.0)
Monocytes Relative: 6 %
Neutro Abs: 7.1 10*3/uL (ref 1.7–7.7)
Neutrophils Relative %: 80 %
Platelets: 225 10*3/uL (ref 150–400)
RBC: 4.07 MIL/uL (ref 3.87–5.11)
RDW: 12.9 % (ref 11.5–15.5)
WBC: 8.9 10*3/uL (ref 4.0–10.5)
nRBC: 0 % (ref 0.0–0.2)

## 2024-03-30 LAB — COMPREHENSIVE METABOLIC PANEL WITH GFR
ALT: 9 U/L (ref 0–44)
AST: 18 U/L (ref 15–41)
Albumin: 3.2 g/dL — ABNORMAL LOW (ref 3.5–5.0)
Alkaline Phosphatase: 16 U/L — ABNORMAL LOW (ref 38–126)
Anion gap: 8 (ref 5–15)
BUN: 9 mg/dL (ref 8–23)
CO2: 25 mmol/L (ref 22–32)
Calcium: 9.4 mg/dL (ref 8.9–10.3)
Chloride: 98 mmol/L (ref 98–111)
Creatinine, Ser: 0.74 mg/dL (ref 0.44–1.00)
GFR, Estimated: >60 mL/min (ref >60)
Glucose, Bld: 217 mg/dL — ABNORMAL HIGH (ref 70–99)
Potassium: 3.5 mmol/L (ref 3.5–5.1)
Sodium: 131 mmol/L — ABNORMAL LOW (ref 135–145)
Total Bilirubin: 0.6 mg/dL (ref 0.0–1.2)
Total Protein: 6 g/dL — ABNORMAL LOW (ref 6.5–8.1)

## 2024-03-30 MED ORDER — FAMOTIDINE IN NACL 20-0.9 MG/50ML-% IV SOLN
20.0000 mg | Freq: Two times a day (BID) | INTRAVENOUS | Status: DC
  Administered 2024-03-30 – 2024-03-31 (×3): 20 mg via INTRAVENOUS
  Filled 2024-03-30 (×3): qty 50

## 2024-03-30 MED ORDER — OXYCODONE HCL 5 MG PO TABS
10.0000 mg | ORAL_TABLET | ORAL | Status: DC | PRN
  Administered 2024-03-30 – 2024-03-31 (×2): 10 mg via ORAL
  Filled 2024-03-30 (×2): qty 2

## 2024-03-30 MED ORDER — DOCUSATE SODIUM 100 MG PO CAPS
200.0000 mg | ORAL_CAPSULE | Freq: Every day | ORAL | Status: DC | PRN
  Administered 2024-03-30: 200 mg via ORAL
  Filled 2024-03-30: qty 2

## 2024-03-30 MED ORDER — TRIMETHOPRIM 100 MG PO TABS
100.0000 mg | ORAL_TABLET | Freq: Every day | ORAL | Status: DC
  Administered 2024-03-31: 100 mg via ORAL
  Filled 2024-03-30: qty 1

## 2024-03-30 MED ORDER — CEFTRIAXONE SODIUM 1 G IJ SOLR
1.0000 g | Freq: Once | INTRAMUSCULAR | Status: DC
  Filled 2024-03-30: qty 10

## 2024-03-30 MED ORDER — SODIUM CHLORIDE 0.9 % IV SOLN
1.0000 g | Freq: Once | INTRAVENOUS | Status: AC
  Administered 2024-03-30: 1 g via INTRAVENOUS
  Filled 2024-03-30: qty 10

## 2024-03-30 NOTE — Plan of Care (Signed)

## 2024-03-30 NOTE — Progress Notes (Signed)
 Pt requested two vanilla ice creams, tolerated well.

## 2024-03-30 NOTE — Progress Notes (Signed)
 PROGRESS NOTE    [name]  XBM:[id number] DOB: 1956-04-06 DOA: 03/28/2024 PCP: Shari Daughters, MD  Chief Complaint  Patient presents with   Chest Pain    Hospital Course:  [name] is 68 y.o. female with refractory hypertension, chronic HFpEF, CKD stage IIIb, COPD, cigarette smoking, chronic pain syndrome, recurrent UTI on suppressive antibiotics, presents with new epigastric pain.  In the ED patient was hypertensive, afebrile, not hypoxic.  Lipase 311, AST 18, ALT 12, bilirubin 0.8.  Opponent negative x 1.  UA with 3+ WBC.  CT scan reveals significant stranding surrounding the pancreas with edematous appearance to the head, neck, body of the pancreas.  CT reveals mild biliary duct ectasia with distended gallbladder.  Subjective: Patient reports she was feeling much better this morning and then had a large breakfast.  Now she is endorsing significant abdominal pain. Patient is also on oxygen  again this morning.  She denies any shortness of breath.  Reports that she wore CPAP overnight  Objective: Vitals:   03/29/24 1941 03/30/24 0401 03/30/24 0759 03/30/24 1356  BP: 132/61 (!) 130/55 138/66 (!) 106/56  Pulse: 63 68 70 64  Resp: 20 18 18 16   Temp: 100.1 F (37.8 C) 98.3 F (36.8 C) 98 F (36.7 C) 98.4 F (36.9 C)  TempSrc:    Oral  SpO2: 98% 94% 95% 93%  Weight:      Height:        Intake/Output Summary (Last 24 hours) at 03/30/2024 1358 Last data filed at 03/30/2024 1034 Gross per 24 hour  Intake 1200 ml  Output --  Net 1200 ml   Filed Weights   03/28/24 1121 03/28/24 1706  Weight: 81.6 kg 81.6 kg    Examination: General exam: Appears calm and comfortable, NAD  Respiratory system: No work of breathing, symmetric chest wall expansion Cardiovascular system: S1 & S2 heard, RRR.  Gastrointestinal system: Abdomen is nondistended, soft, minimally tender to palpation in epigastrium Neuro: Alert and oriented. No focal neurological deficits. Extremities:  Symmetric, expected ROM Skin: No rashes, lesions Psychiatry: Demonstrates appropriate judgement and insight. Mood & affect appropriate for situation.   Assessment & Plan:  Principal Problem:   Pancreatitis Active Problems:   Pancreatitis, acute    Acute pancreatitis - Lipids within normal limits, last drink over 2 weeks ago, not a daily drinker.  CT does reveal distended gallbladder, possibility of passed stone. - LFTs remain normal, will not pursue an MRCP for now.  Continue to monitor closely - Patient did well with clear liquid diet yesterday and was advanced to regular diet but subsequently had severe pain today. - N.p.o. again for now, ok for water and ice chips - Continue with as needed pain meds  Recurrent UTIs - On suppressive antibiotic therapy at home - Started on ceftriaxone  during this admission due to complaints of dysuria.  No urine culture sent.  Will complete 3-day course ceftriaxone  today - Continue home dose trimethoprim  starting tomorrow  Hypertension - Has history of refractory hypertension, multiple home meds.  Have resumed all - Continue to monitor blood pressure closely.  Currently responsive and appropriate to current meds - At home on clonidine , Cardizem , irbesartan , minoxidil .   History of COPD, no acute exacerbation - No wheezing appreciated on exam though patient does have increasing oxygen  requirement.  I suspect her baseline is under 92%. - Perform chest x-ray today to better evaluate hypoxia - Adjust O2 goals, 88-92%. - Continue CPAP at night  Anxiety Depression -  Continue home dose Lexapro   CKD stage IIIb Hyponatremia - Follows with nephrology outpatient.  Baseline GFR 46 - CT on admission shows severe atrophy of right kidney and mild atrophy of left kidney - Creatinine currently 0.8, better than baseline - Continue to trend creatinine closely.  Remained stable today. - Avoid nephrotoxic meds  Type 2 diabetes, with CKD - Hemoglobin A1c 6  months ago 5.4% - Continue daily monitoring of blood glucose on CMP.  No indication for insulin  at this time.  Tobacco abuse - Counseled on cessation  OSA -- Continue CPAP at bedtime  DVT prophylaxis: Heparin    Code Status: Full Code Family Communication:  Discussed directly with patient Disposition: Inpatient, still hospitalized for pain control and resolving pancreatitis.  Will discharge directly to home when pain is resolved and she is tolerating regular diet Consultants:    Procedures:    Antimicrobials:  Anti-infectives (From admission, onward)    Start     Dose/Rate Route Frequency Ordered Stop   03/28/24 1600  cefTRIAXone  (ROCEPHIN ) 1 g in sodium chloride  0.9 % 100 mL IVPB        1 g 200 mL/hr over 30 Minutes Intravenous Every 24 hours 03/28/24 1554         Data Reviewed: I have personally reviewed following labs and imaging studies CBC: Recent Labs  Lab 03/28/24 1125 03/29/24 0948 03/30/24 0947  WBC 11.8* 10.8* 8.9  NEUTROABS  --  9.2* 7.1  HGB 13.3 13.9 13.0  HCT 40.4 41.0 38.7  MCV 95.3 94.3 95.1  PLT 295 264 225   Basic Metabolic Panel: Recent Labs  Lab 03/28/24 1125 03/29/24 0948 03/30/24 0947  NA 130* 132* 131*  K 3.9 4.1 3.5  CL 103 101 98  CO2 21* 25 25  GLUCOSE 116* 87 217*  BUN 13 9 9   CREATININE 0.80 0.79 0.74  CALCIUM  9.1 9.4 9.4   GFR: Estimated Creatinine Clearance: 75 mL/min (by C-G formula based on SCr of 0.74 mg/dL). Liver Function Tests: Recent Labs  Lab 03/28/24 1125 03/29/24 0948 03/30/24 0947  AST 18 13* 18  ALT 12 11 9   ALKPHOS 18* 19* 16*  BILITOT 0.5 0.6 0.6  PROT 6.3* 6.2* 6.0*  ALBUMIN 3.5 3.4* 3.2*   CBG: No results for input(s): "GLUCAP" in the last 168 hours.  No results found for this or any previous visit (from the past 240 hours).   Radiology Studies: No results found.   Scheduled Meds:  busPIRone   30 mg Oral BID   cloNIDine   0.2 mg Oral BID   diltiazem   180 mg Oral Daily   escitalopram   20  mg Oral Daily   fenofibrate   160 mg Oral Daily   fluticasone  furoate-vilanterol  1 puff Inhalation Daily   gabapentin   800 mg Oral TID   heparin  injection (subcutaneous)  5,000 Units Subcutaneous Q12H   irbesartan   300 mg Oral Daily   levothyroxine   25 mcg Oral QAC breakfast   minoxidil   7.5 mg Oral Daily   nicotine   14 mg Transdermal Daily   pantoprazole   40 mg Oral q morning   pramipexole   0.5 mg Oral QHS   umeclidinium bromide   1 puff Inhalation Daily   Continuous Infusions:  cefTRIAXone  (ROCEPHIN )  IV 1 g (03/29/24 1508)   famotidine  (PEPCID ) IV 20 mg (03/30/24 1322)     LOS: 2 days  MDM: Patient is high risk for one or more organ failure.  They necessitate ongoing hospitalization for continued IV therapies and  subsequent lab monitoring. Total time spent interpreting labs and vitals, coordinating care amongst consultants and care team members, directly assessing and discussing care with the patient and/or family: 55 min    Rachel Calvert, DO [facility]  To contact the attending physician between 7A-7P please use Epic Chat. To contact the covering physician during after hours 7P-7A, please review Amion.   03/30/2024, 1:58 PM   *This document has been created with the assistance of dictation software. Please excuse typographical errors. *

## 2024-03-30 NOTE — TOC Initial Note (Signed)
 Transition of Care [facility]) - Initial/Assessment Note    Patient Details  Name: [name] MRN: [id number] Date of Birth: 1956-11-11  Transition of Care [facility] CM/SW Contact:    Alexandra Ice, RN Phone Number: 03/30/2024, 12:40 PM  Clinical Narrative:                 Met with patient at bedside, explained reason for visit and role of TOC coordinator. She is currently staying at her boyfriend's house. She is independent with ADLs. DME: CPAP, nocturnal oxygen , WC, cane, tub bench. She does have PCP, and does not have problems with her getting her medications. She is getting caregiver services through [facility]. She has transportation at discharge. TOC will monitor for any discharge needs.  Expected Discharge Plan: Home/Self Care Barriers to Discharge: Continued Medical Work up   Patient Goals and CMS Choice            Expected Discharge Plan and Services       Living arrangements for the past 2 months: Single Family Home                                      Prior Living Arrangements/Services Living arrangements for the past 2 months: Single Family Home Lives with:: Significant Other (Staying at boyfriend's house currently) Patient language and need for interpreter reviewed:: Yes Do you feel safe going back to the place where you live?: Yes      Need for Family Participation in Patient Care: No (Comment) Care giver support system in place?: Yes (comment) Current home services: DME, Homehealth aide (Nocturnal oxygen , CPAP, cane, tub bench) Criminal Activity/Legal Involvement Pertinent to Current Situation/Hospitalization: No - Comment as needed  Activities of Daily Living   ADL Screening (condition at time of admission) Independently performs ADLs?: Yes (appropriate for developmental age) Is the patient deaf or have difficulty hearing?: No Does the patient have difficulty seeing, even when wearing glasses/contacts?: No Does the patient have  difficulty concentrating, remembering, or making decisions?: No  Permission Sought/Granted                  Emotional Assessment Appearance:: Appears stated age Attitude/Demeanor/Rapport: Engaged Affect (typically observed): Appropriate Orientation: : Oriented to Self, Oriented to Place, Oriented to  Time, Oriented to Situation   Psych Involvement: No (comment)  Admission diagnosis:  Pancreatitis [K85.90] Acute pancreatitis, unspecified complication status, unspecified pancreatitis type [K85.90] Patient Active Problem List   Diagnosis Date Noted   Pancreatitis, acute 03/28/2024   Pancreatitis 03/28/2024   Acute maxillary sinusitis 11/28/2023   Prediabetes 10/21/2023   Mixed hyperlipidemia 10/21/2023   Onychomycosis 08/10/2023   Back pain 05/24/2023   Cellulitis of left lower extremity 04/26/2023   Infected laceration 04/26/2023   Stage 3b chronic kidney disease (HCC) 04/26/2023   Hyperkalemia 04/26/2023   Sepsis due to pneumonia (HCC) 07/06/2022   Severe sepsis (HCC) 07/06/2022   Type 2 diabetes mellitus with peripheral neuropathy (HCC) 07/06/2022   Hypothyroidism, unspecified 07/06/2022   Normocytic anemia 06/19/2022   Complete rectal prolapse 06/18/2022   Chronic respiratory failure with hypoxia (HCC) 06/18/2022   GERD (gastroesophageal reflux disease) 04/06/2022   Restless leg syndrome 04/06/2022   AKI (acute kidney injury) (HCC) 04/06/2022   Delayed gastric emptying 04/06/2022   Anxiety and depression 01/25/2021   Essential hypertension 01/25/2021   Acquired hypothyroidism 01/25/2021   Acute on chronic respiratory failure with  hypoxia (HCC) 11/17/2020   Protein calorie malnutrition (HCC) 11/17/2020   Hypothyroidism 11/17/2020   Leg pain 02/05/2020   Myofascial pain syndrome 08/23/2018   Lumbar spondylosis 08/23/2018   Leg swelling 07/31/2018   Tobacco abuse 07/31/2018   Fusion of lumbar spine (L3-S1) 07/20/2018   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    Lumbar degenerative disc disease 07/20/2018   Chronic pain syndrome 04/18/2014   Respiratory failure (HCC) 04/14/2014   UTI (urinary tract infection) 04/28/2013   Weakness generalized 04/28/2013   Chronic diastolic CHF (congestive heart failure) (HCC) 04/28/2013   Sinus tachycardia 01/11/2013   COPD with acute exacerbation (HCC) 01/10/2013   Diastolic CHF, acute on chronic (HCC) 01/10/2013   Osteoarthritis of left knee 10/27/2011   COPD (chronic obstructive pulmonary disease) (HCC) 03/29/2011   Hypertension 03/29/2011   PCP:  Shari Daughters, MD Pharmacy:   [id number] - [place], [place] - [address] [place] [address] Phone: [phone] Fax: [phone]  [facility] [address] [place] - [address] [place] [address] Phone: [phone] Fax: [phone]     Social Drivers of Health (SDOH) Social History: SDOH Screenings   Food Insecurity: No Food Insecurity (03/28/2024)  Housing: Low Risk  (03/28/2024)  Transportation Needs: No Transportation Needs (03/28/2024)  Recent Concern: Transportation Needs - Unmet Transportation Needs (01/25/2024)   Received from [facility]  Utilities: Not At Risk (03/28/2024)  Alcohol  Screen: Low Risk  (12/22/2022)  Depression (PHQ2-9): Low Risk  (02/22/2023)  Recent Concern: Depression (PHQ2-9) - High Risk (01/04/2023)  Financial Resource Strain: Low Risk  (03/09/2024)   Received from [facility]  Social Connections: Socially Isolated (03/28/2024)  Tobacco Use: High Risk (03/28/2024)   SDOH Interventions:     Readmission Risk Interventions     No data to display

## 2024-03-30 NOTE — Plan of Care (Signed)
  Problem: Education: Goal: Knowledge of General Education information will improve Description: Including pain rating scale, medication(s)/side effects and non-pharmacologic comfort measures Outcome: Progressing   Problem: Health Behavior/Discharge P

## 2024-03-31 DIAGNOSIS — K8500 Idiopathic acute pancreatitis without necrosis or infection: Secondary | ICD-10-CM | POA: Diagnosis not present

## 2024-03-31 LAB — CBC WITH DIFFERENTIAL/PLATELET
Abs Immature Granulocytes: 0.04 10*3/uL (ref 0.00–0.07)
Basophils Absolute: 0 10*3/uL (ref 0.0–0.1)
Basophils Relative: 1 %
Eosinophils Absolute: 0.2 10*3/uL (ref 0.0–0.5)
Eosinophils Relative: 3 %
HCT: 33 % — ABNORMAL LOW (ref 36.0–46.0)
Hemoglobin: 11.2 g/dL — ABNORMAL LOW (ref 12.0–15.0)
Immature Granulocytes: 1 %
Lymphocytes Relative: 18 %
Lymphs Abs: 1.3 10*3/uL (ref 0.7–4.0)
MCH: 31.8 pg (ref 26.0–34.0)
MCHC: 33.9 g/dL (ref 30.0–36.0)
MCV: 93.8 fL (ref 80.0–100.0)
Monocytes Absolute: 0.6 10*3/uL (ref 0.1–1.0)
Monocytes Relative: 9 %
Neutro Abs: 4.9 10*3/uL (ref 1.7–7.7)
Neutrophils Relative %: 68 %
Platelets: 212 10*3/uL (ref 150–400)
RBC: 3.52 MIL/uL — ABNORMAL LOW (ref 3.87–5.11)
RDW: 12.9 % (ref 11.5–15.5)
WBC: 7.1 10*3/uL (ref 4.0–10.5)
nRBC: 0 % (ref 0.0–0.2)

## 2024-03-31 LAB — COMPREHENSIVE METABOLIC PANEL WITH GFR
ALT: 9 U/L (ref 0–44)
AST: 13 U/L — ABNORMAL LOW (ref 15–41)
Albumin: 2.9 g/dL — ABNORMAL LOW (ref 3.5–5.0)
Alkaline Phosphatase: 15 U/L — ABNORMAL LOW (ref 38–126)
Anion gap: 7 (ref 5–15)
BUN: 9 mg/dL (ref 8–23)
CO2: 27 mmol/L (ref 22–32)
Calcium: 9.2 mg/dL (ref 8.9–10.3)
Chloride: 98 mmol/L (ref 98–111)
Creatinine, Ser: 0.82 mg/dL (ref 0.44–1.00)
GFR, Estimated: >60 mL/min (ref >60)
Glucose, Bld: 102 mg/dL — ABNORMAL HIGH (ref 70–99)
Potassium: 3.8 mmol/L (ref 3.5–5.1)
Sodium: 132 mmol/L — ABNORMAL LOW (ref 135–145)
Total Bilirubin: 1 mg/dL (ref 0.0–1.2)
Total Protein: 5.6 g/dL — ABNORMAL LOW (ref 6.5–8.1)

## 2024-03-31 LAB — IGG 1, 2, 3, AND 4
IgG (Immunoglobin G), Serum: 969 mg/dL (ref 586–1602)
IgG, Subclass 1: 518 mg/dL (ref 248–810)
IgG, Subclass 2: 271 mg/dL (ref 130–555)
IgG, Subclass 3: 25 mg/dL (ref 15–102)
IgG, Subclass 4: 45 mg/dL (ref 2–96)

## 2024-03-31 LAB — MAGNESIUM: Magnesium: 1.7 mg/dL (ref 1.7–2.4)

## 2024-03-31 LAB — PHOSPHORUS: Phosphorus: 2.7 mg/dL (ref 2.5–4.6)

## 2024-03-31 MED ORDER — ACETAMINOPHEN ER 650 MG PO TBCR: 650.0000 mg | EXTENDED_RELEASE_TABLET | Freq: Three times a day (TID) | ORAL | 0 refills | Status: AC | PRN

## 2024-03-31 NOTE — Plan of Care (Signed)
  Problem: Education: Goal: Knowledge of General Education information will improve Description: Including pain rating scale, medication(s)/side effects and non-pharmacologic comfort measures Outcome: Progressing   Problem: Clinical Measurements: Goal: Diagnostic test results will improve Outcome: Progressing   Problem: Activity: Goal: Risk for activity intolerance will decrease Outcome: Progressing   Problem: Coping: Goal: Level of anxiety will decrease Outcome: Progressing   Problem: Pain Managment: Goal: General experience of comfort will improve and/or be controlled Outcome: Progressing   Problem: Safety: Goal: Ability to remain free from injury will improve Outcome: Progressing

## 2024-03-31 NOTE — Discharge Summary (Signed)
 Physician Discharge Summary   Patient: [name] MRN: [id number] DOB: 1956-11-01  Admit date:     03/28/2024  Discharge date: 03/31/24  Discharge Physician: Roise Cleaver   PCP: Shari Daughters, MD   Recommendations at discharge:   Follow up with primary care physician for outpatient medication management  Discharge Diagnoses: Principal Problem:   Pancreatitis Active Problems:   Pancreatitis, acute  Resolved Problems:   * No resolved hospital problems. *  Hospital Course: [name] is 68 y.o. female with refractory hypertension, chronic HFpEF, CKD stage IIIb, COPD, cigarette smoking, chronic pain syndrome, recurrent UTI on suppressive antibiotics, presents with new epigastric pain.  In the ED patient was hypertensive, afebrile, not hypoxic.  Lipase 311, AST 18, ALT 12, bilirubin 0.8.  Opponent negative x 1.  UA with 3+ WBC.  CT scan reveals significant stranding surrounding the pancreas with edematous appearance to the head, neck, body of the pancreas.  CT reveals mild biliary duct ectasia with distended gallbladder. LFTs remained within normal limits.  She was initially made n.p.o. and managed with IV fluids and pain meds.  Gradually her diet was advanced.  On 4/19 she was tolerating a regular diet and pain was well-managed.  She was requesting for discharge home.   Acute pancreatitis - Lipids within normal limits, last drink over 2 weeks ago, not a daily drinker.  CT does reveal distended gallbladder, possibility of passed stone. - LFTs remain normal, pain improving. Will not pursue an MRCP  - Tolerating full diet now  Recurrent UTIs - On suppressive antibiotic therapy at home - Status post 3 days ceftriaxone . No urine culture sent. - Continue home dose trimethoprim     Hypertension - Cont clonidine , Cardizem , irbesartan , minoxidil .    History of COPD, no acute exacerbation - Patient with intermittent oxygen  requirement during admission.  Chest x-ray  unremarkable.  Suspect patient's baseline closer to 90%.  Once this was accounted for she had no further O2 requirements.  - Continue CPAP at night   Anxiety Depression - Continue home dose Lexapro    CKD stage IIIb Hyponatremia - Follows with nephrology outpatient.  Baseline GFR 46 - CT on admission shows severe atrophy of right kidney and mild atrophy of left kidney - Creatinine currently 0.8, better than baseline - Avoid nephrotoxic meds   Type 2 diabetes, with CKD - Hemoglobin A1c 6 months ago 5.4%   Tobacco abuse - Counseled on cessation   OSA -- Continue CPAP at bedtime   Consultants: n/a Procedures performed: n/a  Disposition: Home Diet recommendation:  Discharge Diet Orders (From admission, onward)     Start     Ordered   03/31/24 0000  Diet general        03/31/24 1300           Regular diet DISCHARGE MEDICATION: Allergies as of 03/31/2024       Reactions   Nsaids Other (See Comments)   Pt states it messes up her kidneys But takes aspirin  & ibuprofen   Thiazide-type Diuretics    Other reaction(s): Unknown   Hydrocodone  Itching        Medication List     STOP taking these medications    oxyCODONE -acetaminophen  5-325 MG tablet Commonly known as: Percocet       TAKE these medications    acetaminophen  650 MG CR tablet Commonly known as: TYLENOL  Take 1 tablet (650 mg total) by mouth every 8 (eight) hours as needed for up to 15 days for pain.  What changed: how much to take   acidophilus Caps capsule Take 1 capsule by mouth daily.   albuterol  108 (90 Base) MCG/ACT inhaler Commonly known as: VENTOLIN  HFA Inhale 2 puffs into the lungs every 4 (four) hours as needed for wheezing or shortness of breath.   budesonide -formoterol 160-4.5 MCG/ACT inhaler Commonly known as: SYMBICORT Inhale 2 puffs into the lungs 2 (two) times daily.   busPIRone  30 MG tablet Commonly known as: BUSPAR  TAKE 1 TABLET BY MOUTH 2 TIMES DAILY.   ciclopirox 8 %  solution Commonly known as: PENLAC Apply 1 application  topically at bedtime.   cloNIDine  0.2 MG tablet Commonly known as: CATAPRES  TAKE 1 TABLET BY MOUTH 2 TIMES DAILY.   diltiazem  180 MG 24 hr capsule Commonly known as: CARDIZEM  CD Take 180 mg by mouth every morning.   diphenhydrAMINE  25 MG tablet Commonly known as: BENADRYL  Take 50 mg by mouth daily.   escitalopram  20 MG tablet Commonly known as: LEXAPRO  Take 1 tablet (20 mg total) by mouth daily. What changed: when to take this   fenofibrate  micronized 134 MG capsule Commonly known as: LOFIBRA Take 1 capsule (134 mg total) by mouth daily before breakfast.   fluticasone  50 MCG/ACT nasal spray Commonly known as: FLONASE  Place 1 spray into both nostrils daily. What changed:  when to take this reasons to take this   gabapentin  800 MG tablet Commonly known as: NEURONTIN  Take 1 tablet (800 mg total) by mouth 3 (three) times daily.   hydrALAZINE  10 MG tablet Commonly known as: APRESOLINE  Take 1 tablet (10 mg total) by mouth 3 (three) times daily as needed. Please take if your blood pressure is above 150/90. You can repeat the dose as needed up with up to 3 pills per day. If your blood pressure is not improving with this, please call your doctor's office.   levothyroxine  25 MCG tablet Commonly known as: SYNTHROID  TAKE 1 TABLET BY MOUTH DAILY BEFORE BREAKFAST.   minoxidil  2.5 MG tablet Commonly known as: LONITEN  Take 3 tablets (7.5 mg total) by mouth daily.   NON FORMULARY Pt uses a c-pap nightly w/ oxygen    olmesartan  40 MG tablet Commonly known as: BENICAR  Take 40 mg by mouth every morning.   OXYGEN  Inhale 3 L into the lungs at bedtime.   pantoprazole  40 MG tablet Commonly known as: PROTONIX  Take 1 tablet (40 mg total) by mouth every morning.   pramipexole  0.5 MG tablet Commonly known as: MIRAPEX  TAKE 1 TABLET BY MOUTH AT BEDTIME.   Spiriva  HandiHaler 18 MCG inhalation capsule Generic drug:  tiotropium Place 18 mcg into inhaler and inhale daily.   terbinafine  250 MG tablet Commonly known as: LAMISIL  Take 250 mg by mouth daily.   trimethoprim  100 MG tablet Commonly known as: TRIMPEX  Take 1 tablet (100 mg total) by mouth daily. What changed: when to take this        Discharge Exam: Filed Weights   03/28/24 1121 03/28/24 1706  Weight: 81.6 kg 81.6 kg   Constitutional:  Normal appearance. Non toxic-appearing.  HENT: Head Normocephalic and atraumatic.  Mucous membranes are moist.  Eyes:  Extraocular intact. Conjunctivae normal. Pupils are equal, round, and reactive to light.  Cardiovascular: Rate and Rhythm: Normal rate and regular rhythm. Abdomen: Mild tenderness palpation in epigastrium Pulmonary: Non labored, symmetric rise of chest wall.  Musculoskeletal:  Normal range of motion.  Skin: warm and dry. not jaundiced.  Neurological: No focal deficit present. alert. Oriented. Psychiatric: Mood and Affect congruent.  Condition at discharge: stable  The results of significant diagnostics from this hospitalization (including imaging, microbiology, ancillary and laboratory) are listed below for reference.   Imaging Studies: DG Chest Port 1 View Result Date: 03/30/2024 CLINICAL DATA:  Hypoxia EXAM: PORTABLE CHEST 1 VIEW COMPARISON:  03/28/2024 FINDINGS: Borderline to mild cardiomegaly with mild atelectasis at the bases. No consolidation, pleural effusion, or pneumothorax. Aortic atherosclerosis. IMPRESSION: Borderline to mild cardiomegaly with mild atelectasis at the bases. Electronically Signed   By: Esmeralda Hedge M.D.   On: 03/30/2024 19:13   CT ABDOMEN PELVIS W CONTRAST Result Date: 03/28/2024 CLINICAL DATA:  Acute abdominal pain EXAM: CT ABDOMEN AND PELVIS WITH CONTRAST TECHNIQUE: Multidetector CT imaging of the abdomen and pelvis was performed using the standard protocol following bolus administration of intravenous contrast. RADIATION DOSE REDUCTION: This exam was  performed according to the departmental dose-optimization program which includes automated exposure control, adjustment of the mA and/or kV according to patient size and/or use of iterative reconstruction technique. CONTRAST:  OMNIPAQUE  IOHEXOL  300 MG/ML  SOLN COMPARISON:  Noncontrast CT chest abdomen pelvis 03/13/2023. FINDINGS: Lower chest: There is some linear opacity lung bases likely scar or atelectasis. No pleural effusion. Heart is slightly enlarged. Coronary artery calcifications are seen. Hepatobiliary: Mild ectasia of the intrahepatic biliary tree. Patent portal vein. Gallbladder is distended. No space-occupying liver lesion. Pancreas: There is significant stranding surrounding the pancreas. There is also a slightly edematous appearance to the head, neck and body of the pancreas. Please correlate for clinical evidence of pancreatitis. Pancreas appears to be enhancing. No well-defined fluid collections at this time. Spleen: Small low-attenuation lesion posterior along the spleen on series 2, image 16 measures 6 mm. Nonspecific. Separate small splenule. Adrenals/Urinary Tract: Adrenal glands are preserved. There is severe atrophy of the right kidney and mild of the left. No enhancing renal mass or collecting system dilatation. Previously there is some ectasia of the right renal collecting system not seen today. Preserved contour to the urinary bladder. Stomach/Bowel: Large bowel demonstrates some surgical changes along the rectum. Please correlate with history. Large bowel is nondilated. Scattered colonic stool. Normal appendix in the right lower quadrant. Normal retrocecal appendix. Stomach is underdistended. Small bowel is nondilated. Vascular/Lymphatic: Scattered vascular calcifications along the aorta and branch vessels. This includes the iliac vessels. Normal caliber aorta and IVC. No specific abnormal lymph node enlargement identified in the abdomen and pelvis. Reproductive: Uterus is present.   No separate adnexal mass. Other: Trace free fluid in the dependent pelvis. No free intra-abdominal air clearly identified. Musculoskeletal: Significant streak artifact related to the spinal fixation hardware in the lumbar spine. Laminectomy changes. There is multifocal degenerative changes. Curvature of the spine. Multilevel stenosis. Curvature of the spine as well. Degenerative changes of the pelvis. IMPRESSION: Significant phlegm a tori stranding surrounding the pancreas with some pancreatic edema. Please correlate with any clinical findings of pancreatitis. No well-defined fluid collections. Splenic vein is patent. Mild biliary duct ectasia with distended gallbladder. Further workup when appropriate. Small amount of ascites. Electronically Signed   By: Adrianna Horde M.D.   On: 03/28/2024 17:24   DG Chest 2 View Result Date: 03/28/2024 CLINICAL DATA:  Chest pain. EXAM: CHEST - 2 VIEW COMPARISON:  05/26/2023, 03/13/2023, 12/03/2022, 07/06/2022 FINDINGS: Cardiomediastinal silhouette and pulmonary vasculature are within normal limits. Mild prominence of the hila appear similar on multiple prior exams. Lungs are clear. IMPRESSION: No acute cardiopulmonary process. Electronically Signed   By: Elester Grim M.D.   On: 03/28/2024 16:12  Microbiology: Results for orders placed or performed in visit on 03/08/24  Urine Culture     Status: Abnormal   Collection Time: 03/08/24  3:06 PM   Specimen: Urine   UR  Result Value Ref Range Status   Urine Culture, Routine Final report (A)  Final   Organism ID, Bacteria Escherichia coli (A)  Final    Comment: Multi-Drug Resistant Organism Greater than 100,000 colony forming units per mL    Antimicrobial Susceptibility Comment  Final    Comment:       ** S = Susceptible; I = Intermediate; R = Resistant **                    P = Positive; N = Negative             MICS are expressed in micrograms per mL    Antibiotic                 RSLT#1    RSLT#2    RSLT#3     RSLT#4 Amoxicillin /Clavulanic Acid    R Ampicillin                     R Cefazolin                       R Cefepime                        S Cefoxitin                      R Cefpodoxime                    R Ceftriaxone                     R Ciprofloxacin                   S Ertapenem                      I Gentamicin                     S Levofloxacin                    S Meropenem                      S Nitrofurantoin                 S Piperacillin /Tazobactam        R Tetracycline                   S Tobramycin                     S Trimethoprim /Sulfa              S     Labs: CBC: Recent Labs  Lab 03/28/24 1125 03/29/24 0948 03/30/24 0947 03/31/24 0457  WBC 11.8* 10.8* 8.9 7.1  NEUTROABS  --  9.2* 7.1 4.9  HGB 13.3 13.9 13.0 11.2*  HCT 40.4 41.0 38.7 33.0*  MCV 95.3 94.3 95.1 93.8  PLT 295 264 225 212   Basic Metabolic Panel: Recent Labs  Lab 03/28/24 1125 03/29/24 0948 03/30/24 0947 03/31/24 0457  NA 130* 132* 131* 132*  K 3.9 4.1 3.5 3.8  CL  103 101 98 98  CO2 21* 25 25 27   GLUCOSE 116* 87 217* 102*  BUN 13 9 9 9   CREATININE 0.80 0.79 0.74 0.82  CALCIUM  9.1 9.4 9.4 9.2  MG  --   --   --  1.7  PHOS  --   --   --  2.7   Liver Function Tests: Recent Labs  Lab 03/28/24 1125 03/29/24 0948 03/30/24 0947 03/31/24 0457  AST 18 13* 18 13*  ALT 12 11 9 9   ALKPHOS 18* 19* 16* 15*  BILITOT 0.5 0.6 0.6 1.0  PROT 6.3* 6.2* 6.0* 5.6*  ALBUMIN 3.5 3.4* 3.2* 2.9*   CBG: No results for input(s): "GLUCAP" in the last 168 hours.  Discharge time spent: 31 minutes.  Signed: Ephraim Reichel, DO [facility] 03/31/2024

## 2024-03-31 NOTE — Hospital Course (Signed)
 [name] is 68 y.o. female with refractory hypertension, chronic HFpEF, CKD stage IIIb, COPD, cigarette smoking, chronic pain syndrome, recurrent UTI on suppressive antibiotics, presents with new epigastric pain.  In the ED patient was hypertensive, afebrile, not hypoxic.  Lipase 311, AST 18, ALT 12, bilirubin 0.8.  Opponent negative x 1.  UA with 3+ WBC.  CT scan reveals significant stranding surrounding the pancreas with edematous appearance to the head, neck, body of the pancreas.  CT reveals mild biliary duct ectasia with distended gallbladder. LFTs remained within normal limits.  She was initially made n.p.o. and managed with IV fluids and pain meds.  Gradually her diet was advanced.  On 4/19 she was tolerating a regular diet and pain was well-managed.  She was requesting for discharge home.   Acute pancreatitis - Lipids within normal limits, last drink over 2 weeks ago, not a daily drinker.  CT does reveal distended gallbladder, possibility of passed stone. - LFTs remain normal, pain improving. Will not pursue an MRCP  - Tolerating full diet now  Recurrent UTIs - On suppressive antibiotic therapy at home - Status post 3 days ceftriaxone . No urine culture sent. - Continue home dose trimethoprim     Hypertension - Cont clonidine , Cardizem , irbesartan , minoxidil .    History of COPD, no acute exacerbation - Patient with intermittent oxygen  requirement during admission.  Chest x-ray unremarkable.  Suspect patient's baseline closer to 90%.  Once this was accounted for she had no further O2 requirements.  - Continue CPAP at night   Anxiety Depression - Continue home dose Lexapro    CKD stage IIIb Hyponatremia - Follows with nephrology outpatient.  Baseline GFR 46 - CT on admission shows severe atrophy of right kidney and mild atrophy of left kidney - Creatinine currently 0.8, better than baseline - Avoid nephrotoxic meds   Type 2 diabetes, with CKD - Hemoglobin A1c 6 months ago  5.4%   Tobacco abuse - Counseled on cessation   OSA -- Continue CPAP at bedtime

## 2024-03-31 NOTE — Plan of Care (Signed)

## 2024-04-02 ENCOUNTER — Other Ambulatory Visit: Payer: Self-pay | Admitting: Internal Medicine

## 2024-04-02 ENCOUNTER — Telehealth: Payer: Self-pay

## 2024-04-02 ENCOUNTER — Other Ambulatory Visit: Payer: Self-pay

## 2024-04-02 DIAGNOSIS — M47816 Spondylosis without myelopathy or radiculopathy, lumbar region: Secondary | ICD-10-CM

## 2024-04-02 DIAGNOSIS — K219 Gastro-esophageal reflux disease without esophagitis: Secondary | ICD-10-CM

## 2024-04-02 MED ORDER — RISAQUAD PO CAPS: 1.0000 | ORAL_CAPSULE | Freq: Every day | ORAL | 0 refills | Status: DC

## 2024-04-02 MED ORDER — GABAPENTIN 800 MG PO TABS
800.0000 mg | ORAL_TABLET | Freq: Three times a day (TID) | ORAL | 0 refills | Status: DC
Start: 2024-04-02 — End: 2024-08-07

## 2024-04-02 NOTE — Transitions of Care (Post Inpatient/ED Visit) (Signed)
 04/02/2024  Name: [name] MRN: [id number] DOB: Mar 28, 1956  Today's TOC FU Call Status: Today's TOC FU Call Status:: Successful TOC FU Call Completed TOC FU Call Complete Date: 04/02/24 Patient's Name and Date of Birth confirmed.  Transition Care Management Follow-up Telephone Call Date of Discharge: 03/30/24 Discharge Facility: [facility]) Type of Discharge: Inpatient Admission Primary Inpatient Discharge Diagnosis:: Pancreatitis How have you been since you were released from the hospital?: Better Any questions or concerns?: No  Items Reviewed: Did you receive and understand the discharge instructions provided?: Yes Medications obtained,verified, and reconciled?: Yes (Medications Reviewed) Any new allergies since your discharge?: No Dietary orders reviewed?: Yes Type of Diet Ordered:: Low Sodium Heart healthy Do you have support at home?: Yes People in Home [RPT]: significant other Name of Support/Comfort Primary Source: [name]  Medications Reviewed Today: Medications Reviewed Today     Reviewed by [name], RN (Case Manager) on 04/02/24 at 1328  Med List Status: <None>   Medication Order Taking? Sig Documenting Provider Last Dose Status Informant  acetaminophen  (TYLENOL ) 650 MG CR tablet [id number]  Take 1 tablet (650 mg total) by mouth every 8 (eight) hours as needed for up to 15 days for pain. Dezii, Alexandra, DO  Active   acidophilus (RISAQUAD) CAPS capsule [id number]  Take 1 capsule by mouth daily. Tejan-Sie, S Ahmed, MD  Active   albuterol  (VENTOLIN  HFA) 108 [phone] Base) MCG/ACT inhaler [id number] No Inhale 2 puffs into the lungs every 4 (four) hours as needed for wheezing or shortness of breath. Rockney Cid, DO 03/28/2024 Morning Active Self, Pharmacy Records  budesonide -formoterol (SYMBICORT) 160-4.5 MCG/ACT inhaler [id number] No Inhale 2 puffs into the lungs 2 (two) times daily. [provider] 03/28/2024 Morning  Active Self, Pharmacy Records  busPIRone  (BUSPAR ) 30 MG tablet [id number] No TAKE 1 TABLET BY MOUTH 2 TIMES DAILY. Shari Daughters, MD 03/28/2024 Active Self, Pharmacy Records  ciclopirox [facility]) 8 % solution [id number]  Apply 1 application  topically at bedtime. [provider]  Active Self, Pharmacy Records  cloNIDine  (CATAPRES ) 0.2 MG tablet [id number] No TAKE 1 TABLET BY MOUTH 2 TIMES DAILY. Tejan-Sie, S Ahmed, MD 03/28/2024 Active Self, Pharmacy Records  diltiazem  (CARDIZEM  CD) 180 MG 24 hr capsule [id number] No Take 180 mg by mouth every morning. [provider] 03/28/2024 Active Self, Pharmacy Records  diphenhydrAMINE  (BENADRYL ) 25 MG tablet [id number]  Take 50 mg by mouth daily. [provider]  Active Self, Pharmacy Records           Med Note Annette Barters, GARY M   Wed Mar 28, 2024  6:14 PM) PRN  escitalopram  (LEXAPRO ) 20 MG tablet [id number] No Take 1 tablet (20 mg total) by mouth daily.  Patient taking differently: Take 20 mg by mouth every morning.   Shari Daughters, MD 03/28/2024 Active Self, Pharmacy Records  fenofibrate  micronized (LOFIBRA) 134 MG capsule [id number] No Take 1 capsule (134 mg total) by mouth daily before breakfast. Shari Daughters, MD 03/28/2024 Active Self, Pharmacy Records  fluticasone  (FLONASE ) 50 MCG/ACT nasal spray [id number] No Place 1 spray into both nostrils daily.  Patient taking differently: Place 1 spray into both nostrils daily as needed for allergies.   Tejan-Sie, S Ahmed, MD 03/28/2024 Active Self, Pharmacy Records  gabapentin  (NEURONTIN ) 800 MG tablet [id number]  Take 1 tablet (800 mg total) by mouth 3 (three) times daily. Tejan-Sie, S Ahmed, MD  Active   hydrALAZINE  (APRESOLINE ) 10 MG tablet [id number] No Take 1 tablet (10  mg total) by mouth 3 (three) times daily as needed. Please take if your blood pressure is above 150/90. You can repeat the dose as needed up with up to 3 pills per day. If your blood pressure is not improving with  this, please call your doctor's office. Mecum, Erin E, PA-C 03/28/2024 Morning Active Self, Pharmacy Records  levothyroxine  (SYNTHROID ) 25 MCG tablet [id number] No TAKE 1 TABLET BY MOUTH DAILY BEFORE BREAKFAST. Tejan-Sie, S Ahmed, MD 03/28/2024 Active Self, Pharmacy Records  minoxidil  (LONITEN ) 2.5 MG tablet [id number] No Take 3 tablets (7.5 mg total) by mouth daily. Shari Daughters, MD 03/27/2024 Active Self, Pharmacy Records  [facility] [id number]  Pt uses a c-pap nightly w/ oxygen  [provider]  Active Self, Pharmacy Records  olmesartan  (BENICAR ) 40 MG tablet [id number] No Take 40 mg by mouth every morning. [provider] 03/28/2024 Active Self, Pharmacy Records  OXYGEN  [id number] No Inhale 3 L into the lungs at bedtime. [provider] Taking Active Self, Pharmacy Records  pantoprazole  (PROTONIX ) 40 MG tablet [id number] No Take 1 tablet (40 mg total) by mouth every morning. Shari Daughters, MD 03/28/2024 Active Self, Pharmacy Records  pramipexole  (MIRAPEX ) 0.5 MG tablet [id number] No TAKE 1 TABLET BY MOUTH AT BEDTIME. Tejan-Sie, S Ahmed, MD 03/27/2024 Active Self, Pharmacy Records  SPIRIVA  HANDIHALER 18 MCG inhalation capsule [id number] No Place 18 mcg into inhaler and inhale daily. [provider] 03/28/2024 Active Self, Pharmacy Records  terbinafine  (LAMISIL ) 250 MG tablet [id number] No Take 250 mg by mouth daily. [provider] 03/28/2024 Active Pharmacy Records, Self  trimethoprim  (TRIMPEX ) 100 MG tablet [id number] No Take 1 tablet (100 mg total) by mouth daily.  Patient taking differently: Take 100 mg by mouth every morning.   Erman Hayward, MD 03/27/2024 Active Self, Pharmacy Records  Med List Note [place], [place] 07/20/18 1202): 07-20-18            Home Care and Equipment/Supplies: Were Home Health Services Ordered?: NA Any new equipment or medical supplies ordered?: NA  Functional Questionnaire: Do you need assistance with  bathing/showering or dressing?: No Do you need assistance with meal preparation?: No Do you need assistance with eating?: No Do you have difficulty maintaining continence: No Do you need assistance with getting out of bed/getting out of a chair/moving?: No  Follow up appointments reviewed: PCP Follow-up appointment confirmed?: Yes Date of PCP follow-up appointment?: 04/03/24 Follow-up Provider: Dr. Donley Furth Specialist [facility] Follow-up appointment confirmed?: Yes Date of Specialist follow-up appointment?: 04/03/24 Follow-Up Specialty Provider:: Dr. Lindsay Rho Do you need transportation to your follow-up appointment?: No Do you understand care options if your condition(s) worsen?: Yes-patient verbalized understanding  SDOH Interventions Today    Flowsheet Row Most Recent Value  SDOH Interventions   Food Insecurity Interventions Intervention Not Indicated  Housing Interventions Intervention Not Indicated  Transportation Interventions Intervention Not Indicated  Utilities Interventions Intervention Not Indicated       Gareld June, BSN, RN [place]  VBCI - [facility] RN Care Manager [phone]

## 2024-04-03 ENCOUNTER — Ambulatory Visit: Admitting: Physician Assistant

## 2024-04-03 ENCOUNTER — Telehealth: Payer: Self-pay | Admitting: Internal Medicine

## 2024-04-03 ENCOUNTER — Ambulatory Visit (INDEPENDENT_AMBULATORY_CARE_PROVIDER_SITE_OTHER): Admitting: Internal Medicine

## 2024-04-03 ENCOUNTER — Other Ambulatory Visit: Payer: Self-pay | Admitting: Internal Medicine

## 2024-04-03 VITALS — BP 136/68 | HR 57 | Temp 97.3°F | Ht 66.0 in | Wt 171.0 lb

## 2024-04-03 DIAGNOSIS — K8500 Idiopathic acute pancreatitis without necrosis or infection: Secondary | ICD-10-CM

## 2024-04-03 DIAGNOSIS — E782 Mixed hyperlipidemia: Secondary | ICD-10-CM | POA: Diagnosis not present

## 2024-04-03 DIAGNOSIS — E039 Hypothyroidism, unspecified: Secondary | ICD-10-CM

## 2024-04-03 DIAGNOSIS — Z0130 Encounter for examination of blood pressure without abnormal findings: Secondary | ICD-10-CM

## 2024-04-03 NOTE — Progress Notes (Signed)
 Established Patient Office Visit  Subjective:  Patient ID: [name], female    DOB: 15-Feb-1956  Age: 68 y.o. MRN: [id number]  Chief Complaint  Patient presents with   Hospitalization Follow-up    Hospital Follow Up    Hospital f/u for acute pancreatitis. Tolerating regular diet, etiology of pancreatitis unclear as trigs were normal and no evidence of concomitant acute cholecystitis. Also recently sustained fractures of her left 5th mcp and ortho have decided to intervene surgically for which she needs medical clearance today.    No other concerns at this time.   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)    Community acquired pneumonia 01/10/2013   COPD (chronic obstructive pulmonary disease) (HCC)    no inhalers--smoker, no oxygen    Depression    Dyspnea    Emphysema lung (HCC)    Failed back surgical syndrome    GERD (gastroesophageal reflux disease)    Hyperlipidemia    Hypertension    Hypothyroid    Lumbar spondylosis    Nicotine  dependence    Normocytic anemia    OSA on CPAP    Peripheral vascular disease (HCC)    Pre-diabetes    Recurrent UTI (urinary tract infection)    Restless leg syndrome    Seizures (HCC)    02/13/2011 -hx of seizure due to "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  no seizure since--pt does take keppra  and office note from neurologist dr. Salli Harrington on this chart   Sepsis due to pneumonia [facility])    Sinus tachycardia    Urinary incontinence    Wound of left leg     being seen at wound clinic as of 03-23-24    Past Surgical History:  Procedure Laterality Date   CARPAL TUNNEL RELEASE     left   COLONOSCOPY WITH PROPOFOL  N/A 06/17/2022   Procedure: COLONOSCOPY WITH PROPOFOL ;  Surgeon: Rachel Darling, MD;  Location: [facility] ENDOSCOPY;  Service: Endoscopy;  Laterality: N/A;   JOINT REPLACEMENT  08/26/2011   right total knee arthroplasty, left total knee   KNEE ARTHROPLASTY  10/28/2011   Procedure: COMPUTER ASSISTED TOTAL KNEE ARTHROPLASTY;  Surgeon: [name];  Location: [facility] ORS;  Service: Orthopedics;  Laterality: Left;  preop femoral nerve block   KNEE SURGERY     right   laminectomy and diskectomy     L4-5 with fusion   OPEN REDUCTION INTERNAL FIXATION (ORIF) METACARPAL Left 01/30/2024   Procedure: OPEN REDUCTION INTERNAL FIXATION (ORIF) OF LEFT 5TH METACARPAL SHAFT FRACTURE.;  Surgeon: Rachel Hahn, MD;  Location: [facility] ORS;  Service: Orthopedics;  Laterality: Left;   PROCTOSCOPY N/A 06/18/2022   Procedure: RIGID PROCTOSCOPY;  Surgeon: Rachel Champagne, MD;  Location: [facility] ORS;  Service: General;  Laterality: N/A;    Social History   Socioeconomic History   Marital status: Widowed    Spouse name: Not on file   Number of children: Not on file   Years of education: Not  on file   Highest education level: Not on file  Occupational History   Not on file  Tobacco Use   Smoking status: Every Day    Current packs/day: 2.00    Average packs/day: 2.0 packs/day for 50.0 years (100.0 ttl pk-yrs)    Types: Cigarettes    Passive exposure: Current   Smokeless tobacco: Never  Vaping Use   Vaping status: Every Day   Substances: Nicotine , Flavoring  Substance and Sexual Activity   Alcohol  use: Yes    Comment: occasional   Drug use: No   Sexual activity: Yes    Birth control/protection: Post-menopausal  Other Topics Concern   Not on file  Social History Narrative   ** Merged History Encounter **       Social Drivers of Health   Financial  Resource Strain: Low Risk  (03/09/2024)   Received from [facility]   Overall Financial Resource Strain (CARDIA)    Difficulty of Paying Living Expenses: Not hard at all  Food Insecurity: No Food Insecurity (04/02/2024)   Hunger Vital Sign    Worried About Running Out of Food in the Last Year: Never true    Ran Out of Food in the Last Year: Never true  Transportation Needs: No Transportation Needs (04/02/2024)   PRAPARE - Administrator, Civil Service (Medical): No    Lack of Transportation (Non-Medical): No  Recent Concern: Transportation Needs - Unmet Transportation Needs (01/25/2024)   [facility] from [facility]    In the past 12 months, has lack of transportation kept you from medical appointments or from getting medications?: Yes    Lack of Transportation (Non-Medical): Yes  Physical Activity: Not on file  Stress: Not on file  Social Connections: Socially Isolated (03/28/2024)   Social Connection and Isolation Panel [NHANES]    Frequency of Communication with Friends and Family: More than three times a week    Frequency of Social Gatherings with Friends and Family: More than three times a week    Attends Religious Services: Never    Database administrator or Organizations: No    Attends Banker Meetings: Never    Marital Status: Widowed  Intimate Partner Violence: Not At Risk (04/02/2024)   Humiliation, Afraid, Rape, and Kick questionnaire    Fear of Current or Ex-Partner: No    Emotionally Abused: No    Physically Abused: No    Sexually Abused: No    Family History  Problem Relation Age of Onset   Heart attack Father 63   Alzheimer'[name] disease Mother    Breast cancer Neg Hx     Allergies  Allergen Reactions   Nsaids Other (See Comments)    Pt states it messes up her kidneys But takes aspirin  & ibuprofen   Thiazide-Type Diuretics     Other reaction([name]): Unknown   Hydrocodone  Itching     Outpatient Medications Prior to Visit  Medication Sig   acetaminophen  (TYLENOL ) 650 MG CR tablet Take 1 tablet (650 mg total) by mouth every 8 (eight) hours as needed for up to 15 days for pain.   acidophilus (RISAQUAD) CAPS capsule Take 1 capsule by mouth daily.   albuterol  (VENTOLIN  HFA) 108 (90 Base) MCG/ACT inhaler Inhale 2 puffs into the lungs every 4 (four) hours as needed for wheezing or shortness of breath.   budesonide -formoterol (SYMBICORT) 160-4.5 MCG/ACT inhaler Inhale 2 puffs into the lungs 2 (two) times daily.  busPIRone  (BUSPAR ) 30 MG tablet TAKE 1 TABLET BY MOUTH 2 TIMES DAILY.   ciclopirox (PENLAC) 8 % solution Apply 1 application  topically at bedtime.   cloNIDine  (CATAPRES ) 0.2 MG tablet TAKE 1 TABLET BY MOUTH 2 TIMES DAILY.   diltiazem  (CARDIZEM  CD) 180 MG 24 hr capsule Take 180 mg by mouth every morning.   diphenhydrAMINE  (BENADRYL ) 25 MG tablet Take 50 mg by mouth daily.   escitalopram  (LEXAPRO ) 20 MG tablet Take 1 tablet (20 mg total) by mouth daily. (Patient taking differently: Take 20 mg by mouth every morning.)   fenofibrate  micronized (LOFIBRA) 134 MG capsule Take 1 capsule (134 mg total) by mouth daily before breakfast.   fluticasone  (FLONASE ) 50 MCG/ACT nasal spray Place 1 spray into both nostrils daily. (Patient taking differently: Place 1 spray into both nostrils daily as needed for allergies.)   gabapentin  (NEURONTIN ) 800 MG tablet Take 1 tablet (800 mg total) by mouth 3 (three) times daily.   hydrALAZINE  (APRESOLINE ) 10 MG tablet Take 1 tablet (10 mg total) by mouth 3 (three) times daily as needed. Please take if your blood pressure is above 150/90. You can repeat the dose as needed up with up to 3 pills per day. If your blood pressure is not improving with this, please call your doctor'Jahi Roza office.   levothyroxine  (SYNTHROID ) 25 MCG tablet TAKE 1 TABLET BY MOUTH DAILY BEFORE BREAKFAST.   minoxidil  (LONITEN ) 2.5 MG tablet Take 3 tablets (7.5 mg total) by mouth  daily.   NON FORMULARY Pt uses a c-pap nightly w/ oxygen    olmesartan  (BENICAR ) 40 MG tablet Take 40 mg by mouth every morning.   OXYGEN  Inhale 3 L into the lungs at bedtime.   pantoprazole  (PROTONIX ) 40 MG tablet TAKE 1 TABLET BY MOUTH EVERY DAY IN THE MORNING   pramipexole  (MIRAPEX ) 0.5 MG tablet TAKE 1 TABLET BY MOUTH AT BEDTIME.   SPIRIVA  HANDIHALER 18 MCG inhalation capsule Place 18 mcg into inhaler and inhale daily.   terbinafine  (LAMISIL ) 250 MG tablet Take 250 mg by mouth daily.   trimethoprim  (TRIMPEX ) 100 MG tablet Take 1 tablet (100 mg total) by mouth daily. (Patient taking differently: Take 100 mg by mouth every morning.)   Facility-Administered Medications Prior to Visit  Medication Dose Route Frequency Provider   chlorhexidine  (HIBICLENS ) 4 % liquid 4 application  60 mL Topical Once Duffy, Karin, PA-C   chlorhexidine  (HIBICLENS ) 4 % liquid 4 application  60 mL Topical Once Duffy, Cayetano Coco, PA-C    Review of Systems  Constitutional: Negative.   HENT: Negative.    Eyes: Negative.   Respiratory:  Positive for shortness of breath (on exertion).   Cardiovascular: Negative.   Gastrointestinal:  Positive for constipation.  Genitourinary: Negative.   Musculoskeletal:  Positive for back pain and falls.  Skin: Negative.   Neurological:  Positive for headaches (chronic).  Endo/Heme/Allergies: Negative.   Psychiatric/Behavioral: Negative.         Objective:   BP 136/68   Pulse (!) 57   Temp (!) 97.3 F (36.3 C) (Tympanic)   Ht 5\' 6"  (1.676 m)   Wt 171 lb (77.6 kg)   SpO2 94%   BMI 27.60 kg/m   Vitals:   04/03/24 1116  BP: 136/68  Pulse: (!) 57  Temp: (!) 97.3 F (36.3 C)  Height: 5\' 6"  (1.676 m)  Weight: 171 lb (77.6 kg)  SpO2: 94%  TempSrc: Tympanic  BMI (Calculated): 27.61    Physical Exam Vitals reviewed.  Constitutional:  General: She is not in acute distress. HENT:     Head: Normocephalic.     Nose: Nose normal.     Mouth/Throat:     Mouth:  Mucous membranes are moist.  Eyes:     Extraocular Movements: Extraocular movements intact.     Pupils: Pupils are equal, round, and reactive to light.  Cardiovascular:     Rate and Rhythm: Normal rate and regular rhythm.     Heart sounds: No murmur heard. Pulmonary:     Effort: Pulmonary effort is normal.     Breath sounds: Decreased air movement present. No rhonchi or rales.  Abdominal:     General: Abdomen is flat.     Palpations: There is no hepatomegaly, splenomegaly or mass.     Tenderness: There is no guarding or rebound.  Musculoskeletal:        General: Normal range of motion.     Right hand: Swelling and deformity present.     Cervical back: Normal range of motion. No tenderness.     Comments: Boot in place  Skin:    General: Skin is warm and dry.     Comments: Left leg wound dressed  Neurological:     General: No focal deficit present.     Mental Status: She is alert and oriented to person, place, and time.     Cranial Nerves: No cranial nerve deficit.     Motor: No weakness.  Psychiatric:        Mood and Affect: Mood normal.        Behavior: Behavior normal.      No results found for any visits on 04/03/24.      Assessment & Plan:  As per problem list. Medically cleared for hand surgery.  Problem List Items Addressed This Visit       Digestive   Pancreatitis, acute - Primary   Relevant Orders   Lipase   Hepatic function panel     Endocrine   Hypothyroidism     Other   Mixed hyperlipidemia    Return in about 2 weeks (around 04/17/2024).   Total time spent: 30 minutes  Arzella Bitters, MD  04/03/2024   This document may have been prepared by [facility] Voice Recognition software and as such may include unintentional dictation errors.

## 2024-04-04 LAB — COMPREHENSIVE METABOLIC PANEL WITH GFR
ALT: 10 IU/L (ref 0–32)
AST: 13 IU/L (ref 0–40)
Albumin: 3.7 g/dL — ABNORMAL LOW (ref 3.9–4.9)
Alkaline Phosphatase: 21 IU/L — ABNORMAL LOW (ref 44–121)
BUN/Creatinine Ratio: 13 (ref 12–28)
BUN: 15 mg/dL (ref 8–27)
Bilirubin Total: 0.2 mg/dL (ref 0.0–1.2)
CO2: 22 mmol/L (ref 20–29)
Calcium: 9.8 mg/dL (ref 8.7–10.3)
Chloride: 95 mmol/L — ABNORMAL LOW (ref 96–106)
Creatinine, Ser: 1.15 mg/dL — ABNORMAL HIGH (ref 0.57–1.00)
Globulin, Total: 2.5 g/dL (ref 1.5–4.5)
Glucose: 112 mg/dL — ABNORMAL HIGH (ref 70–99)
Potassium: 4.5 mmol/L (ref 3.5–5.2)
Sodium: 134 mmol/L (ref 134–144)
Total Protein: 6.2 g/dL (ref 6.0–8.5)
eGFR: 52 mL/min/{1.73_m2} — ABNORMAL LOW (ref >59)

## 2024-04-04 LAB — LIPID PANEL
Chol/HDL Ratio: 3.3 ratio (ref 0.0–4.4)
Cholesterol, Total: 112 mg/dL (ref 100–199)
HDL: 34 mg/dL — ABNORMAL LOW (ref >39)
LDL Chol Calc (NIH): 59 mg/dL (ref 0–99)
Triglycerides: 99 mg/dL (ref 0–149)
VLDL Cholesterol Cal: 19 mg/dL (ref 5–40)

## 2024-04-04 LAB — HEPATIC FUNCTION PANEL
ALT: 10 IU/L (ref 0–32)
AST: 14 IU/L (ref 0–40)
Albumin: 3.7 g/dL — ABNORMAL LOW (ref 3.9–4.9)
Alkaline Phosphatase: 22 IU/L — ABNORMAL LOW (ref 44–121)
Bilirubin Total: 0.2 mg/dL (ref 0.0–1.2)
Bilirubin, Direct: 0.12 mg/dL (ref 0.00–0.40)
Total Protein: 6.4 g/dL (ref 6.0–8.5)

## 2024-04-04 LAB — TSH: TSH: 3 u[IU]/mL (ref 0.450–4.500)

## 2024-04-04 LAB — LIPASE: Lipase: 153 U/L — ABNORMAL HIGH (ref 14–72)

## 2024-04-05 ENCOUNTER — Telehealth: Payer: Self-pay

## 2024-04-05 NOTE — Telephone Encounter (Signed)
 Patient called asking for her lab results, they are not resulted yet. Once TJ has reviewed them we will get her the results

## 2024-04-05 NOTE — Telephone Encounter (Signed)
 Patient called asking for test results from Monday please advise

## 2024-04-06 ENCOUNTER — Encounter: Payer: Self-pay | Admitting: Internal Medicine

## 2024-04-06 NOTE — Telephone Encounter (Signed)
 PT wants to know her lab results

## 2024-04-06 NOTE — Telephone Encounter (Signed)
 Patient has called again and also called the oncall service for results and states she needs a  referral to a pancreatic doctor?

## 2024-04-09 ENCOUNTER — Telehealth: Payer: Self-pay

## 2024-04-09 ENCOUNTER — Ambulatory Visit: Admitting: Physician Assistant

## 2024-04-09 DIAGNOSIS — G4733 Obstructive sleep apnea (adult) (pediatric): Secondary | ICD-10-CM | POA: Diagnosis not present

## 2024-04-09 DIAGNOSIS — Z8781 Personal history of (healed) traumatic fracture: Secondary | ICD-10-CM | POA: Diagnosis not present

## 2024-04-09 DIAGNOSIS — Z79899 Other long term (current) drug therapy: Secondary | ICD-10-CM | POA: Diagnosis not present

## 2024-04-09 DIAGNOSIS — S82832A Other fracture of upper and lower end of left fibula, initial encounter for closed fracture: Secondary | ICD-10-CM | POA: Diagnosis not present

## 2024-04-09 DIAGNOSIS — Z5181 Encounter for therapeutic drug level monitoring: Secondary | ICD-10-CM | POA: Diagnosis not present

## 2024-04-09 DIAGNOSIS — R2689 Other abnormalities of gait and mobility: Secondary | ICD-10-CM | POA: Diagnosis not present

## 2024-04-09 NOTE — Telephone Encounter (Signed)
 Patient called asking for lab results again.

## 2024-04-10 ENCOUNTER — Telehealth: Payer: Self-pay

## 2024-04-10 ENCOUNTER — Encounter: Payer: Self-pay | Admitting: Internal Medicine

## 2024-04-10 NOTE — Telephone Encounter (Signed)
 Patient has called again asking for her lab results she said she spoke with the GI DR and they told her that her symptoms didn't sound like pancreatitis please advise

## 2024-04-11 ENCOUNTER — Other Ambulatory Visit: Payer: Self-pay | Admitting: Acute Care

## 2024-04-11 DIAGNOSIS — F17210 Nicotine dependence, cigarettes, uncomplicated: Secondary | ICD-10-CM

## 2024-04-11 DIAGNOSIS — Z122 Encounter for screening for malignant neoplasm of respiratory organs: Secondary | ICD-10-CM

## 2024-04-11 DIAGNOSIS — Z87891 Personal history of nicotine dependence: Secondary | ICD-10-CM

## 2024-04-17 ENCOUNTER — Ambulatory Visit (INDEPENDENT_AMBULATORY_CARE_PROVIDER_SITE_OTHER): Admitting: Internal Medicine

## 2024-04-17 VITALS — BP 142/78 | HR 94 | Temp 98.1°F | Ht 66.0 in | Wt 163.0 lb

## 2024-04-17 DIAGNOSIS — K8500 Idiopathic acute pancreatitis without necrosis or infection: Secondary | ICD-10-CM | POA: Diagnosis not present

## 2024-04-17 DIAGNOSIS — M4722 Other spondylosis with radiculopathy, cervical region: Secondary | ICD-10-CM | POA: Insufficient documentation

## 2024-04-17 DIAGNOSIS — M47816 Spondylosis without myelopathy or radiculopathy, lumbar region: Secondary | ICD-10-CM

## 2024-04-17 DIAGNOSIS — M5412 Radiculopathy, cervical region: Secondary | ICD-10-CM | POA: Diagnosis not present

## 2024-04-17 DIAGNOSIS — Z0130 Encounter for examination of blood pressure without abnormal findings: Secondary | ICD-10-CM

## 2024-04-17 DIAGNOSIS — E782 Mixed hyperlipidemia: Secondary | ICD-10-CM | POA: Diagnosis not present

## 2024-04-17 DIAGNOSIS — J301 Allergic rhinitis due to pollen: Secondary | ICD-10-CM

## 2024-04-17 MED ORDER — AZELASTINE HCL 137 MCG/SPRAY NA SOLN: 1.0000 | Freq: Every day | NASAL | 2 refills | Status: DC

## 2024-04-17 MED ORDER — CETIRIZINE HCL 10 MG PO TABS: 10.0000 mg | ORAL_TABLET | Freq: Every day | ORAL | 2 refills | Status: DC

## 2024-04-17 NOTE — Progress Notes (Signed)
 Established Patient Office Visit  Subjective:  Patient ID: [name], female    DOB: 04-07-1956  Age: 68 y.o. MRN: [id number]  Chief Complaint  Patient presents with   Referral    Discuss Referral    Abd pain has resolved, tolerating full diet while tingling in her hands persist. Of note Cervical xr in 2018 was notable for cervical djd.    No other concerns at this time.   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)    Community acquired pneumonia 01/10/2013   COPD (chronic obstructive pulmonary disease) (HCC)    no inhalers--smoker, no oxygen    Depression    Dyspnea    Emphysema lung (HCC)    Failed back surgical syndrome    GERD (gastroesophageal reflux disease)    Hyperlipidemia    Hypertension    Hypothyroid    Lumbar spondylosis    Nicotine  dependence    Normocytic anemia    OSA on CPAP    Peripheral vascular disease (HCC)    Pre-diabetes    Recurrent UTI (urinary tract infection)    Restless leg syndrome    Seizures (HCC)    02/13/2011 -hx of seizure due to "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  no seizure since--pt does take keppra  and office note from neurologist dr. Salli Crawley on this chart   Sepsis due to pneumonia [facility])    Sinus tachycardia    Urinary incontinence    Wound of left leg    being seen at wound clinic as of 03-23-24    Past Surgical History:  Procedure Laterality Date   CARPAL TUNNEL RELEASE     left   COLONOSCOPY WITH PROPOFOL  N/A 06/17/2022   Procedure:  COLONOSCOPY WITH PROPOFOL ;  Surgeon: Shane Darling, MD;  Location: [facility] ENDOSCOPY;  Service: Endoscopy;  Laterality: N/A;   JOINT REPLACEMENT  08/26/2011   right total knee arthroplasty, left total knee   KNEE ARTHROPLASTY  10/28/2011   Procedure: COMPUTER ASSISTED TOTAL KNEE ARTHROPLASTY;  Surgeon: Annie Barton Rendall III;  Location: [facility] ORS;  Service: Orthopedics;  Laterality: Left;  preop femoral nerve block   KNEE SURGERY     right   laminectomy and diskectomy     L4-5 with fusion   OPEN REDUCTION INTERNAL FIXATION (ORIF) METACARPAL Left 01/30/2024   Procedure: OPEN REDUCTION INTERNAL FIXATION (ORIF) OF LEFT 5TH METACARPAL SHAFT FRACTURE.;  Surgeon: Elner Hahn, MD;  Location: [facility] ORS;  Service: Orthopedics;  Laterality: Left;   PROCTOSCOPY N/A 06/18/2022   Procedure: RIGID PROCTOSCOPY;  Surgeon: Candyce Champagne, MD;  Location: [facility] ORS;  Service: General;  Laterality: N/A;    Social History   Socioeconomic History   Marital status: Widowed    Spouse name: Not on file   Number of children: Not on file   Years of education: Not on file   Highest education level: Not on file  Occupational History   Not on file  Tobacco Use   Smoking  status: Every Day    Current packs/day: 2.00    Average packs/day: 2.0 packs/day for 50.0 years (100.0 ttl pk-yrs)    Types: Cigarettes    Passive exposure: Current   Smokeless tobacco: Never  Vaping Use   Vaping status: Every Day   Substances: Nicotine , Flavoring  Substance and Sexual Activity   Alcohol  use: Yes    Comment: occasional   Drug use: No   Sexual activity: Yes    Birth control/protection: Post-menopausal  Other Topics Concern   Not on file  Social History Narrative   ** Merged History Encounter **       Social Drivers of Health   Financial Resource Strain: Low Risk  (03/09/2024)   Received from [facility]   Overall Financial Resource Strain (CARDIA)    Difficulty of Paying Living Expenses: Not hard at all   Food Insecurity: No Food Insecurity (04/02/2024)   Hunger Vital Sign    Worried About Running Out of Food in the Last Year: Never true    Ran Out of Food in the Last Year: Never true  Transportation Needs: No Transportation Needs (04/02/2024)   PRAPARE - Administrator, Civil Service (Medical): No    Lack of Transportation (Non-Medical): No  Recent Concern: Transportation Needs - Unmet Transportation Needs (01/25/2024)   Received from [facility]    In the past 12 months, has lack of transportation kept you from medical appointments or from getting medications?: Yes    Lack of Transportation (Non-Medical): Yes  Physical Activity: Not on file  Stress: Not on file  Social Connections: Socially Isolated (03/28/2024)   Social Connection and Isolation Panel [NHANES]    Frequency of Communication with Friends and Family: More than three times a week    Frequency of Social Gatherings with Friends and Family: More than three times a week    Attends Religious Services: Never    Database administrator or Organizations: No    Attends Banker Meetings: Never    Marital Status: Widowed  Intimate Partner Violence: Not At Risk (04/02/2024)   Humiliation, Afraid, Rape, and Kick questionnaire    Fear of Current or Ex-Partner: No    Emotionally Abused: No    Physically Abused: No    Sexually Abused: No    Family History  Problem Relation Age of Onset   Heart attack Father 10   Alzheimer'Caleb Prigmore disease Mother    Breast cancer Neg Hx     Allergies  Allergen Reactions   Nsaids Other (See Comments)    Pt states it messes up her kidneys But takes aspirin  & ibuprofen   Thiazide-Type Diuretics     Other reaction(Jacki Couse): Unknown   Hydrocodone  Itching    Outpatient Medications Prior to Visit  Medication Sig   acidophilus (RISAQUAD) CAPS capsule Take 1 capsule by mouth daily.   albuterol  (VENTOLIN  HFA) 108 (90 Base) MCG/ACT inhaler  Inhale 2 puffs into the lungs every 4 (four) hours as needed for wheezing or shortness of breath.   budesonide -formoterol (SYMBICORT) 160-4.5 MCG/ACT inhaler Inhale 2 puffs into the lungs 2 (two) times daily.   busPIRone  (BUSPAR ) 30 MG tablet TAKE 1 TABLET BY MOUTH 2 TIMES DAILY.   ciclopirox (PENLAC) 8 % solution Apply 1 application  topically at bedtime.   cloNIDine  (CATAPRES ) 0.2 MG tablet TAKE 1 TABLET BY MOUTH 2 TIMES DAILY.   diltiazem  (CARDIZEM  CD) 180 MG 24 hr capsule  Take 180 mg by mouth every morning.   diphenhydrAMINE  (BENADRYL ) 25 MG tablet Take 50 mg by mouth daily.   escitalopram  (LEXAPRO ) 20 MG tablet Take 1 tablet (20 mg total) by mouth daily. (Patient taking differently: Take 20 mg by mouth every morning.)   fenofibrate  micronized (LOFIBRA) 134 MG capsule Take 1 capsule (134 mg total) by mouth daily before breakfast.   fluticasone  (FLONASE ) 50 MCG/ACT nasal spray Place 1 spray into both nostrils daily. (Patient taking differently: Place 1 spray into both nostrils daily as needed for allergies.)   gabapentin  (NEURONTIN ) 800 MG tablet Take 1 tablet (800 mg total) by mouth 3 (three) times daily.   hydrALAZINE  (APRESOLINE ) 10 MG tablet Take 1 tablet (10 mg total) by mouth 3 (three) times daily as needed. Please take if your blood pressure is above 150/90. You can repeat the dose as needed up with up to 3 pills per day. If your blood pressure is not improving with this, please call your doctor'Brasen Bundren office.   levothyroxine  (SYNTHROID ) 25 MCG tablet TAKE 1 TABLET BY MOUTH EVERY DAY BEFORE BREAKFAST   minoxidil  (LONITEN ) 2.5 MG tablet Take 3 tablets (7.5 mg total) by mouth daily.   NON FORMULARY Pt uses a c-pap nightly w/ oxygen    olmesartan  (BENICAR ) 40 MG tablet Take 40 mg by mouth every morning.   OXYGEN  Inhale 3 L into the lungs at bedtime.   pantoprazole  (PROTONIX ) 40 MG tablet TAKE 1 TABLET BY MOUTH EVERY DAY IN THE MORNING   pramipexole  (MIRAPEX ) 0.5 MG tablet TAKE 1 TABLET BY MOUTH AT  BEDTIME.   SPIRIVA  HANDIHALER 18 MCG inhalation capsule Place 18 mcg into inhaler and inhale daily.   terbinafine  (LAMISIL ) 250 MG tablet Take 250 mg by mouth daily.   trimethoprim  (TRIMPEX ) 100 MG tablet Take 1 tablet (100 mg total) by mouth daily. (Patient taking differently: Take 100 mg by mouth every morning.)   Facility-Administered Medications Prior to Visit  Medication Dose Route Frequency Provider   chlorhexidine  (HIBICLENS ) 4 % liquid 4 application  60 mL Topical Once Duffy, Karin, PA-C   chlorhexidine  (HIBICLENS ) 4 % liquid 4 application  60 mL Topical Once Duffy, Cayetano Coco, PA-C    Review of Systems  Constitutional: Negative.   HENT: Negative.    Eyes: Negative.   Respiratory:  Positive for shortness of breath (on exertion).   Cardiovascular: Negative.   Gastrointestinal:  Positive for constipation.  Genitourinary: Negative.   Musculoskeletal:  Positive for back pain and falls.  Skin: Negative.   Neurological:  Positive for sensory change and headaches (chronic).  Endo/Heme/Allergies: Negative.   Psychiatric/Behavioral: Negative.         Objective:   BP (!) 142/78   Pulse 94   Temp 98.1 F (36.7 C) (Tympanic)   Ht 5\' 6"  (1.676 m)   Wt 163 lb (73.9 kg)   SpO2 90%   BMI 26.31 kg/m   Vitals:   04/17/24 1600  BP: (!) 142/78  Pulse: 94  Temp: 98.1 F (36.7 C)  Height: 5\' 6"  (1.676 m)  Weight: 163 lb (73.9 kg)  SpO2: 90%  TempSrc: Tympanic  BMI (Calculated): 26.32    Physical Exam Vitals reviewed.  Constitutional:      General: She is not in acute distress.    Comments: Seated in a wheelchair  HENT:     Head: Normocephalic.     Nose: Nose normal.     Mouth/Throat:     Mouth: Mucous membranes are moist.  Eyes:  Extraocular Movements: Extraocular movements intact.     Pupils: Pupils are equal, round, and reactive to light.  Cardiovascular:     Rate and Rhythm: Normal rate and regular rhythm.     Heart sounds: No murmur heard. Pulmonary:      Effort: Pulmonary effort is normal.     Breath sounds: Decreased air movement present. No rhonchi or rales.  Abdominal:     General: Abdomen is flat.     Palpations: There is no hepatomegaly, splenomegaly or mass.     Tenderness: There is no guarding or rebound.  Musculoskeletal:        General: Normal range of motion.     Right hand: Swelling and deformity present.     Cervical back: Normal range of motion. No tenderness.  Skin:    General: Skin is warm and dry.     Comments: Left leg wound dressed  Neurological:     General: No focal deficit present.     Mental Status: She is alert and oriented to person, place, and time.     Cranial Nerves: No cranial nerve deficit.     Motor: No weakness.  Psychiatric:        Mood and Affect: Mood normal.        Behavior: Behavior normal.      No results found for any visits on 04/17/24.      Assessment & Plan:  As per problem list  Problem List Items Addressed This Visit   None   No follow-ups on file.   Total time spent: 20 minutes  Rachel Bitters, MD  04/17/2024   This document may have been prepared by [facility] Voice Recognition software and as such may include unintentional dictation errors.

## 2024-04-20 ENCOUNTER — Ambulatory Visit: Admitting: Internal Medicine

## 2024-04-24 ENCOUNTER — Ambulatory Visit: Admitting: Internal Medicine

## 2024-04-25 ENCOUNTER — Telehealth (INDEPENDENT_AMBULATORY_CARE_PROVIDER_SITE_OTHER): Payer: Self-pay

## 2024-04-25 NOTE — Telephone Encounter (Signed)
 [name] called requesting an appointment stating both hands are numb and pain and the outside of her right leg hurts. It hurts even more when she walks. She's afraid of a clot and would like and appointment.

## 2024-04-25 NOTE — Telephone Encounter (Signed)
 She has a number of issues in her neck that are likely causing her numbness, likely pinching of nerves and not blood flow related.  Dr. Beau Bound placed a referral for her to neurology for this and I would recommend that she makes sure she follows with them for that. We can evaluate with bilateral upper extremity arterial duplex.  She can also have ABIs.  She can see anyone.  If she feels this is urgent and she can't wait for a visit she can go to urgent care/ED

## 2024-04-25 NOTE — Telephone Encounter (Signed)
 Called patient and go the VM. Let message to call back.

## 2024-04-26 NOTE — Telephone Encounter (Signed)
 There is a neurologist in [place] but they are in [place].  If her insurance requires her PCP to enter the referral, I would ask them to refer her there so that there are no issues with insurance

## 2024-04-26 NOTE — Telephone Encounter (Signed)
 Patient is scheduled in Aug to see Neuro. I told her call her insurance to make sure they would cover it. She has medicare and medicaid.

## 2024-04-27 ENCOUNTER — Other Ambulatory Visit: Payer: Self-pay | Admitting: Internal Medicine

## 2024-04-27 DIAGNOSIS — I10 Essential (primary) hypertension: Secondary | ICD-10-CM

## 2024-04-30 ENCOUNTER — Other Ambulatory Visit (INDEPENDENT_AMBULATORY_CARE_PROVIDER_SITE_OTHER): Payer: Self-pay | Admitting: Nurse Practitioner

## 2024-04-30 DIAGNOSIS — R200 Anesthesia of skin: Secondary | ICD-10-CM

## 2024-04-30 DIAGNOSIS — M79604 Pain in right leg: Secondary | ICD-10-CM

## 2024-05-01 ENCOUNTER — Ambulatory Visit: Admitting: Student in an Organized Health Care Education/Training Program

## 2024-05-01 ENCOUNTER — Encounter (INDEPENDENT_AMBULATORY_CARE_PROVIDER_SITE_OTHER): Payer: Self-pay

## 2024-05-02 ENCOUNTER — Encounter (INDEPENDENT_AMBULATORY_CARE_PROVIDER_SITE_OTHER): Payer: Self-pay | Admitting: Vascular Surgery

## 2024-05-02 ENCOUNTER — Ambulatory Visit (INDEPENDENT_AMBULATORY_CARE_PROVIDER_SITE_OTHER): Admitting: Vascular Surgery

## 2024-05-02 ENCOUNTER — Ambulatory Visit (INDEPENDENT_AMBULATORY_CARE_PROVIDER_SITE_OTHER)

## 2024-05-02 VITALS — BP 141/93 | HR 87 | Resp 16 | Ht 66.0 in | Wt 161.0 lb

## 2024-05-02 DIAGNOSIS — M79604 Pain in right leg: Secondary | ICD-10-CM

## 2024-05-02 DIAGNOSIS — R200 Anesthesia of skin: Secondary | ICD-10-CM

## 2024-05-02 DIAGNOSIS — R202 Paresthesia of skin: Secondary | ICD-10-CM

## 2024-05-02 DIAGNOSIS — M542 Cervicalgia: Secondary | ICD-10-CM

## 2024-05-02 DIAGNOSIS — I10 Essential (primary) hypertension: Secondary | ICD-10-CM

## 2024-05-03 ENCOUNTER — Other Ambulatory Visit: Payer: Self-pay | Admitting: Surgery

## 2024-05-03 LAB — VAS US ABI WITH/WO TBI
Left ABI: 1
Right ABI: 1.09

## 2024-05-04 ENCOUNTER — Encounter (INDEPENDENT_AMBULATORY_CARE_PROVIDER_SITE_OTHER): Payer: Self-pay | Admitting: Vascular Surgery

## 2024-05-04 NOTE — Progress Notes (Signed)
 Subjective:    Patient ID: [name], female    DOB: 1956/10/28, 67 y.o.   MRN: [id number] Chief Complaint  Patient presents with   Follow-up    Bilateral ue arterial and abi    [name] is a 68 yo female who presents to clinic today with complaints of right leg pain and bilateral hand numbness and tingling.  Patient completed today bilateral lower extremity vascular ultrasounds with ABIs.  These were all normal with triphasic pulses.  Right ABI today is 1.09 left ABI today is 1.00.  No previous ABIs identified.  Patient also underwent vascular upper extremity arterial duplex ultrasounds.  These were all normal as well.  She had triphasic waveforms throughout with normal pressures.  Therefore she has no vascular issues to explain her symptoms today.  However the patient does endorse that she has had 8 spinal fusions 12 to 14 years ago which correlates with her symptoms.  I asked her who she had seen as a neurosurgeon and she endorses she has never seen a neurosurgeon.  We had a long detailed discussion about her upper arm extremity neurogenic pains.  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  Pain to her right lower extremity appears to be neurogenic as well such as sciatica.  With her history of fusions to her lower lumbar spine I recommend that the patient see neurosurgery and follow-up to help with her current issues.    Review of Systems  Constitutional: Negative.   HENT: Negative.    Respiratory: Negative.    Cardiovascular: Negative.   Gastrointestinal: Negative.   Genitourinary: Negative.   Musculoskeletal:  Positive for back pain and neck stiffness.  Skin: Negative.   Neurological: Negative.   Psychiatric/Behavioral: Negative.    All other systems reviewed and are negative.      Objective:    Physical Exam Vitals reviewed.  Constitutional:      Appearance: Normal appearance. She is normal weight.  HENT:     Head:  Normocephalic.  Eyes:     Pupils: Pupils are equal, round, and reactive to light.  Cardiovascular:     Rate and Rhythm: Normal rate and regular rhythm.     Pulses: Normal pulses.     Heart sounds: Normal heart sounds.  Pulmonary:     Effort: Pulmonary effort is normal.     Breath sounds: Normal breath sounds.  Abdominal:     General: Abdomen is flat. Bowel sounds are normal.     Palpations: Abdomen is soft.  Musculoskeletal:        General: Tenderness present.     Cervical back: Rigidity and tenderness present.  Skin:    General: Skin is warm and dry.     Capillary Refill: Capillary refill takes 2 to 3 seconds.  Neurological:     General: No focal deficit present.     Mental Status: She is alert and oriented to person, place, and time. Mental status is at baseline.  Psychiatric:        Mood and Affect: Mood normal.        Behavior: Behavior normal.        Thought Content: Thought content normal.        Judgment: Judgment normal.     BP (!) 141/93   Pulse 87   Resp 16   Ht 5\' 6"  (1.676 m)   Wt 161 lb (73 kg)   BMI 25.99 kg/m   Past Medical  History:  Diagnosis Date   (HFpEF) heart failure with preserved ejection fraction (HCC)    Acute renal failure (HCC) 03/29/2011   Acute respiratory distress 11/2020   Acute respiratory failure (HCC) 04/14/2014   Anxiety    Arthritis    CHF (congestive heart failure) (HCC)    Chronic pain syndrome    Chronic respiratory failure with hypoxia and hypercapnia (HCC)    CKD stage 3b, GFR 30-44 ml/min (HCC)    Community acquired pneumonia 01/10/2013   COPD (chronic obstructive pulmonary disease) (HCC)    no inhalers--smoker, no oxygen    Depression    Dyspnea    Emphysema lung (HCC)    Failed back surgical syndrome    GERD (gastroesophageal reflux disease)    Hyperlipidemia    Hypertension    Hypothyroid    Lumbar spondylosis    Nicotine  dependence    Normocytic anemia    OSA on CPAP    Peripheral vascular disease (HCC)     Pre-diabetes    Recurrent UTI (urinary tract infection)    Restless leg syndrome    Seizures (HCC)    02/13/2011 -hx of seizure due to "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  no seizure since--pt does take keppra  and office note from neurologist dr. Salli Crawley on this chart   Sepsis due to pneumonia [facility])    Sinus tachycardia    Urinary incontinence    Wound of left leg    being seen at wound clinic as of 03-23-24    Social History   Socioeconomic History   Marital status: Widowed    Spouse name: Not on file   Number of children: Not on file   Years of education: Not on file   Highest education level: Not on file  Occupational History   Not on file  Tobacco Use   Smoking status: Every Day    Current packs/day: 2.00    Average packs/day: 2.0 packs/day for 50.0 years (100.0 ttl pk-yrs)    Types: Cigarettes    Passive exposure: Current   Smokeless tobacco: Never  Vaping Use   Vaping status: Every Day   Substances: Nicotine , Flavoring  Substance and Sexual Activity   Alcohol  use: Yes    Comment: occasional   Drug use: No   Sexual activity: Yes    Birth control/protection: Post-menopausal  Other Topics Concern   Not on file  Social History Narrative   ** Merged History Encounter **       Social Drivers of Health   Financial Resource Strain: Low Risk  (03/09/2024)   Received from [facility]   Overall Financial Resource Strain (CARDIA)    Difficulty of Paying Living Expenses: Not hard at all  Food Insecurity: No Food Insecurity (04/02/2024)   Hunger Vital Sign    Worried About Running Out of Food in the Last Year: Never true    Ran Out of Food in the Last Year: Never true  Transportation Needs: No Transportation Needs (04/02/2024)   PRAPARE - Administrator, Civil Service (Medical): No    Lack of Transportation (Non-Medical): No  Recent  Concern: Transportation Needs - Unmet Transportation Needs (01/25/2024)   Received from [facility] - Transportation    In the past 12 months, has lack of transportation kept you from medical appointments or from getting medications?: Yes  Lack of Transportation (Non-Medical): Yes  Physical Activity: Not on file  Stress: Not on file  Social Connections: Socially Isolated (03/28/2024)   Social Connection and Isolation Panel [NHANES]    Frequency of Communication with Friends and Family: More than three times a week    Frequency of Social Gatherings with Friends and Family: More than three times a week    Attends Religious Services: Never    Database administrator or Organizations: No    Attends Banker Meetings: Never    Marital Status: Widowed  Intimate Partner Violence: Not At Risk (04/02/2024)   Humiliation, Afraid, Rape, and Kick questionnaire    Fear of Current or Ex-Partner: No    Emotionally Abused: No    Physically Abused: No    Sexually Abused: No    Past Surgical History:  Procedure Laterality Date   CARPAL TUNNEL RELEASE     left   COLONOSCOPY WITH PROPOFOL  N/A 06/17/2022   Procedure: COLONOSCOPY WITH PROPOFOL ;  Surgeon: Shane Darling, MD;  Location: [facility] ENDOSCOPY;  Service: Endoscopy;  Laterality: N/A;   JOINT REPLACEMENT  08/26/2011   right total knee arthroplasty, left total knee   KNEE ARTHROPLASTY  10/28/2011   Procedure: COMPUTER ASSISTED TOTAL KNEE ARTHROPLASTY;  Surgeon: Annie Barton Rendall III;  Location: [facility] ORS;  Service: Orthopedics;  Laterality: Left;  preop femoral nerve block   KNEE SURGERY     right   laminectomy and diskectomy     L4-5 with fusion   OPEN REDUCTION INTERNAL FIXATION (ORIF) METACARPAL Left 01/30/2024   Procedure: OPEN REDUCTION INTERNAL FIXATION (ORIF) OF LEFT 5TH METACARPAL SHAFT FRACTURE.;  Surgeon: Elner Hahn, MD;  Location: [facility] ORS;  Service: Orthopedics;  Laterality: Left;   PROCTOSCOPY N/A  06/18/2022   Procedure: RIGID PROCTOSCOPY;  Surgeon: Candyce Champagne, MD;  Location: [facility] ORS;  Service: General;  Laterality: N/A;    Family History  Problem Relation Age of Onset   Heart attack Father 72   Alzheimer's disease Mother    Breast cancer Neg Hx     Allergies  Allergen Reactions   Nsaids Other (See Comments)    Pt states it messes up her kidneys But takes aspirin  & ibuprofen   Thiazide-Type Diuretics     Other reaction(s): Unknown   Hydrocodone  Itching       Latest Ref Rng & Units 03/31/2024    4:57 AM 03/30/2024    9:47 AM 03/29/2024    9:48 AM  CBC  WBC 4.0 - 10.5 K/uL 7.1  8.9  10.8   Hemoglobin 12.0 - 15.0 g/dL 09.8  11.9  14.7   Hematocrit 36.0 - 46.0 % 33.0  38.7  41.0   Platelets 150 - 400 K/uL 212  225  264        CMP     Component Value Date/Time   NA 134 04/03/2024 1304   K 4.5 04/03/2024 1304   CL 95 (L) 04/03/2024 1304   CO2 22 04/03/2024 1304   GLUCOSE 112 (H) 04/03/2024 1304   GLUCOSE 102 (H) 03/31/2024 0457   BUN 15 04/03/2024 1304   CREATININE 1.15 (H) 04/03/2024 1304   CREATININE 1.25 (H) 02/22/2023 1447   CALCIUM  9.8 04/03/2024 1304   PROT 6.2 04/03/2024 1304   ALBUMIN 3.7 (L) 04/03/2024 1304   AST 13 04/03/2024 1304   ALT 10 04/03/2024 1304   ALKPHOS 21 (L) 04/03/2024 1304   BILITOT 0.2 04/03/2024 1304   GFR 37.03 (L) 03/29/2011 1540  EGFR 52 (L) 04/03/2024 1304   GFRNONAA >60 03/31/2024 0457     VAS US  ABI WITH/WO TBI Result Date: 05/03/2024  LOWER EXTREMITY DOPPLER STUDY Patient Name:  [name]  Date of Exam:   05/02/2024 Medical Rec #: [id number]       Accession #:    [id number] Date of Birth: 03/25/1956        Patient Gender: F Patient Age:   85 years Exam Location:  [facility] Procedure:      VAS US  ABI WITH/WO TBI Referring Phys: Sharla Davis --------------------------------------------------------------------------------  Indications: Claudication. High Risk Factors: Hypertension, Diabetes, current smoker.  Other Factors: Bilateral lower extremity numbness.  Performing Technologist: Oneta Bilberry RVT  Examination Guidelines: A complete evaluation includes at minimum, Doppler waveform signals and systolic blood pressure reading at the level of bilateral brachial, anterior tibial, and posterior tibial arteries, when vessel segments are accessible. Bilateral testing is considered an integral part of a complete examination. Photoelectric Plethysmograph (PPG) waveforms and toe systolic pressure readings are included as required and additional duplex testing as needed. Limited examinations for reoccurring indications may be performed as noted.  ABI Findings: +---------+------------------+-----+---------+--------+ Right    Rt Pressure (mmHg)IndexWaveform Comment  +---------+------------------+-----+---------+--------+ Brachial 148                                      +---------+------------------+-----+---------+--------+ PTA      153               1.01 triphasic         +---------+------------------+-----+---------+--------+ DP       164               1.09 triphasic         +---------+------------------+-----+---------+--------+ Great Toe129               0.85                   +---------+------------------+-----+---------+--------+ +---------+------------------+-----+---------+-------+ Left     Lt Pressure (mmHg)IndexWaveform Comment +---------+------------------+-----+---------+-------+ Brachial 151                                     +---------+------------------+-----+---------+-------+ PTA      151               1.00 triphasic        +---------+------------------+-----+---------+-------+ DP       145               0.96 triphasic        +---------+------------------+-----+---------+-------+ Great Toe114               0.75                  +---------+------------------+-----+---------+-------+ +-------+-----------+-----------+------------+------------+  ABI/TBIToday's ABIToday's TBIPrevious ABIPrevious TBI +-------+-----------+-----------+------------+------------+ Right  1.09       0.85                                +-------+-----------+-----------+------------+------------+ Left   1.00       0.75                                +-------+-----------+-----------+------------+------------+   Summary: Right: Resting right ankle-brachial  index is within normal range. The right toe-brachial index is normal. Left: Resting left ankle-brachial index is within normal range. The left toe-brachial index is normal. *See table(s) above for measurements and observations.  Electronically signed by Devon Fogo MD on 05/03/2024 at 8:40:46 AM.    Final        Assessment & Plan:   1. Right leg pain (Primary) Patient underwent both upper extremity and lower extremity vascular ultrasounds today.  They were both completely normal.  Therefore I believe that the pain she is experiencing her right leg as well as the numbness and tingling to her hands is neurogenic and arthritic in nature.  Therefore the plan is to consult neurosurgery to evaluate.  Patient agrees with this plan.  2. Neck pain Patient's symptoms most likely related to long history of arthritis as well as cervical spine issues.  Patient's symptoms appear to be largely neurogenic.  3. Numbness and tingling in both hands Patient's symptoms most likely related to long history of arthritis as well as cervical spine issues.  Patient's symptoms appear to be largely neurogenic.  4. Primary hypertension Continue antihypertensive medications as already ordered, these medications have been reviewed and there are no changes at this time.   Current Outpatient Medications on File Prior to Visit  Medication Sig Dispense Refill   acidophilus (RISAQUAD) CAPS capsule Take 1 capsule by mouth daily. 9 capsule 0   albuterol  (VENTOLIN  HFA) 108 (90 Base) MCG/ACT inhaler Inhale 2 puffs into the lungs  every 4 (four) hours as needed for wheezing or shortness of breath. 18 g 11   Azelastine  HCl 137 MCG/SPRAY SOLN Place 1 spray into the nose daily. 30 mL 2   budesonide -formoterol (SYMBICORT) 160-4.5 MCG/ACT inhaler Inhale 2 puffs into the lungs 2 (two) times daily.     busPIRone  (BUSPAR ) 30 MG tablet TAKE 1 TABLET BY MOUTH 2 TIMES DAILY. 180 tablet 1   cetirizine  (ZYRTEC  ALLERGY) 10 MG tablet Take 1 tablet (10 mg total) by mouth daily. 30 tablet 2   ciclopirox (PENLAC) 8 % solution Apply 1 application  topically at bedtime.     cloNIDine  (CATAPRES ) 0.2 MG tablet TAKE 1 TABLET BY MOUTH TWICE A DAY 180 tablet 0   diltiazem  (CARDIZEM  CD) 180 MG 24 hr capsule Take 180 mg by mouth every morning.     diphenhydrAMINE  (BENADRYL ) 25 MG tablet Take 50 mg by mouth daily.     escitalopram  (LEXAPRO ) 20 MG tablet Take 1 tablet (20 mg total) by mouth daily. (Patient taking differently: Take 20 mg by mouth every morning.) 30 tablet 3   fenofibrate  micronized (LOFIBRA) 134 MG capsule Take 1 capsule (134 mg total) by mouth daily before breakfast. 90 capsule 0   fluticasone  (FLONASE ) 50 MCG/ACT nasal spray Place 1 spray into both nostrils daily. (Patient taking differently: Place 1 spray into both nostrils daily as needed for allergies.) 16 mL 2   gabapentin  (NEURONTIN ) 800 MG tablet Take 1 tablet (800 mg total) by mouth 3 (three) times daily. 270 tablet 0   hydrALAZINE  (APRESOLINE ) 10 MG tablet Take 1 tablet (10 mg total) by mouth 3 (three) times daily as needed. Please take if your blood pressure is above 150/90. You can repeat the dose as needed up with up to 3 pills per day. If your blood pressure is not improving with this, please call your doctor's office. 30 tablet 0   levothyroxine  (SYNTHROID ) 25 MCG tablet TAKE 1 TABLET BY MOUTH EVERY DAY BEFORE BREAKFAST 90 tablet  0   minoxidil  (LONITEN ) 2.5 MG tablet Take 3 tablets (7.5 mg total) by mouth daily. 90 tablet 2   NON FORMULARY Pt uses a c-pap nightly w/ oxygen       olmesartan  (BENICAR ) 40 MG tablet Take 40 mg by mouth every morning.     OXYGEN  Inhale 3 L into the lungs at bedtime.     pantoprazole  (PROTONIX ) 40 MG tablet TAKE 1 TABLET BY MOUTH EVERY DAY IN THE MORNING 90 tablet 1   pramipexole  (MIRAPEX ) 0.5 MG tablet TAKE 1 TABLET BY MOUTH AT BEDTIME. 90 tablet 1   SPIRIVA  HANDIHALER 18 MCG inhalation capsule Place 18 mcg into inhaler and inhale daily.     terbinafine  (LAMISIL ) 250 MG tablet Take 250 mg by mouth daily.     trimethoprim  (TRIMPEX ) 100 MG tablet Take 1 tablet (100 mg total) by mouth daily. (Patient taking differently: Take 100 mg by mouth every morning.) 30 tablet 11   Current Facility-Administered Medications on File Prior to Visit  Medication Dose Route Frequency Provider Last Rate Last Admin   chlorhexidine  (HIBICLENS ) 4 % liquid 4 application  60 mL Topical Once Duffy, Karin, PA-C       chlorhexidine  (HIBICLENS ) 4 % liquid 4 application  60 mL Topical Once Duffy, Karin, PA-C        There are no Patient Instructions on file for this visit. No follow-ups on file.   Annamaria Barrette, NP

## 2024-05-08 ENCOUNTER — Other Ambulatory Visit: Payer: Self-pay | Admitting: Neurology

## 2024-05-08 DIAGNOSIS — M5412 Radiculopathy, cervical region: Secondary | ICD-10-CM

## 2024-05-08 DIAGNOSIS — R200 Anesthesia of skin: Secondary | ICD-10-CM

## 2024-05-10 ENCOUNTER — Other Ambulatory Visit (INDEPENDENT_AMBULATORY_CARE_PROVIDER_SITE_OTHER): Payer: Self-pay | Admitting: Vascular Surgery

## 2024-05-10 ENCOUNTER — Encounter
Admission: RE | Admit: 2024-05-10 | Discharge: 2024-05-10 | Disposition: A | Discharge status: Home / Self Care | Source: Ambulatory Visit | Attending: Surgery | Admitting: Surgery

## 2024-05-10 ENCOUNTER — Other Ambulatory Visit: Payer: Self-pay

## 2024-05-10 DIAGNOSIS — M25561 Pain in right knee: Secondary | ICD-10-CM

## 2024-05-10 DIAGNOSIS — E1142 Type 2 diabetes mellitus with diabetic polyneuropathy: Secondary | ICD-10-CM

## 2024-05-10 NOTE — Patient Instructions (Addendum)
 Your procedure is scheduled on: 05/17/24 - Thursday Report to the Registration Desk on the 1st floor of the [facility]. To find out your arrival time, please call [phone] between 1PM - 3PM on: 05/16/24 - Wednesday If your arrival time is 6:00 am, do not arrive before that time as the [facility] entrance doors do not open until 6:00 am.  REMEMBER: Instructions that are not followed completely may result in serious medical risk, up to and including death; or upon the discretion of your surgeon and anesthesiologist your surgery may need to be rescheduled.  Do not eat food after midnight the night before surgery.  No gum chewing or hard candies.  You may however, drink CLEAR liquids up to 2 hours before you are scheduled to arrive for your surgery. Do not drink anything within 2 hours of your scheduled arrival time.  Clear liquids include: - water   Your doctor has order you a Pre-Surgery drink -  G2 Gatorade ( No red color), complete this 2 hours before you are scheduled to arrive on the day of surgery.  One week prior to surgery: Stop Anti-inflammatories (NSAIDS) such as Advil, Aleve, Ibuprofen, Motrin, Naproxen, Naprosyn and Aspirin  based products such as Excedrin, Goody's Powder, BC Powder. You may continue to take Tylenol  if needed for pain up until the day of surgery.  Stop ANY OVER THE COUNTER supplements until after surgery : Multiple Vitamins-Minerals   Continue taking all of your other prescription medications up until the day before surgery.  ON THE DAY OF SURGERY ONLY TAKE THESE MEDICATIONS WITH SIPS OF WATER:  SYMBICORT  busPIRone  (BUSPAR )  cloNIDine  (CATAPRES )  diltiazem  (CARDIZEM  CD)  escitalopram  (LEXAPRO )  fenofibrate   gabapentin  (NEURONTIN  ) levothyroxine  (SYNTHROID )  pantoprazole  (PROTONIX )   Use inhaler albuterol  (VENTOLIN  HFA)  on the day of surgery and bring to the hospital.   No Alcohol  for 24 hours before or after surgery.  No Smoking including  e-cigarettes for 24 hours before surgery.  No chewable tobacco products for at least 6 hours before surgery.  No nicotine  patches on the day of surgery.  Do not use any "recreational" drugs for at least a week (preferably 2 weeks) before your surgery.  Please be advised that the combination of cocaine and anesthesia may have negative outcomes, up to and including death. If you test positive for cocaine, your surgery will be cancelled.  On the morning of surgery brush your teeth with toothpaste and water, you may rinse your mouth with mouthwash if you wish. Do not swallow any toothpaste or mouthwash.  Use CHG Soap or wipes as directed on instruction sheet.  Do not wear jewelry, make-up, hairpins, clips or nail polish.  For welded (permanent) jewelry: bracelets, anklets, waist bands, etc.  Please have this removed prior to surgery.  If it is not removed, there is a chance that hospital personnel will need to cut it off on the day of surgery.  Do not wear lotions, powders, or perfumes.   Do not shave body hair from the neck down 48 hours before surgery.  Contact lenses, hearing aids and dentures may not be worn into surgery.  Do not bring valuables to the hospital. [facility] is not responsible for any missing/lost belongings or valuables.   Notify your doctor if there is any change in your medical condition (cold, fever, infection).  Wear comfortable clothing (specific to your surgery type) to the hospital.  After surgery, you can help prevent lung complications by doing  breathing exercises.  Take deep breaths and cough every 1-2 hours. Your doctor may order a device called an Incentive Spirometer to help you take deep breaths.  When coughing or sneezing, hold a pillow firmly against your incision with both hands. This is called "splinting." Doing this helps protect your incision. It also decreases belly discomfort.  If you are being admitted to the hospital overnight, leave your  suitcase in the car. After surgery it may be brought to your room.  In case of increased patient census, it may be necessary for you, the patient, to continue your postoperative care in the Same Day Surgery department.  If you are being discharged the day of surgery, you will not be allowed to drive home. You will need a responsible individual to drive you home and stay with you for 24 hours after surgery.   If you are taking public transportation, you will need to have a responsible individual with you.  Please call the Pre-admissions Testing Dept. at [phone] if you have any questions about these instructions.  Surgery Visitation Policy:  Patients having surgery or a procedure may have two visitors.  Children under the age of 71 must have an adult with them who is not the patient.  Inpatient Visitation:    Visiting hours are 7 a.m. to 8 p.m. Up to four visitors are allowed at one time in a patient room. The visitors may rotate out with other people during the day.  One visitor age 3 or older may stay with the patient overnight and must be in the room by 8 p.m.    Preparing for Surgery with CHLORHEXIDINE  GLUCONATE (CHG) Soap  Chlorhexidine  Gluconate (CHG) Soap  o An antiseptic cleaner that kills germs and bonds with the skin to continue killing germs even after washing  o Used for showering the night before surgery and morning of surgery  Before surgery, you can play an important role by reducing the number of germs on your skin.  CHG (Chlorhexidine  gluconate) soap is an antiseptic cleanser which kills germs and bonds with the skin to continue killing germs even after washing.  Please do not use if you have an allergy to CHG or antibacterial soaps. If your skin becomes reddened/irritated stop using the CHG.  1. Shower the NIGHT BEFORE SURGERY and the MORNING OF SURGERY with CHG soap.  2. If you choose to wash your hair, wash your hair first as usual with your normal  shampoo.  3. After shampooing, rinse your hair and body thoroughly to remove the shampoo.  4. Use CHG as you would any other liquid soap. You can apply CHG directly to the skin and wash gently with a scrungie or a clean washcloth.  5. Apply the CHG soap to your body only from the neck down. Do not use on open wounds or open sores. Avoid contact with your eyes, ears, mouth, and genitals (private parts). Wash face and genitals (private parts) with your normal soap.  6. Wash thoroughly, paying special attention to the area where your surgery will be performed.  7. Thoroughly rinse your body with warm water.  8. Do not shower/wash with your normal soap after using and rinsing off the CHG soap.  9. Pat yourself dry with a clean towel.  10. Wear clean pajamas to bed the night before surgery.  12. Place clean sheets on your bed the night of your first shower and do not sleep with pets.  13. Shower again with the  CHG soap on the day of surgery prior to arriving at the hospital.  14. Do not apply any deodorants/lotions/powders.  15. Please wear clean clothes to the hospital.

## 2024-05-14 ENCOUNTER — Ambulatory Visit: Admission: RE | Admit: 2024-05-14 | Payer: 59 | Source: Home / Self Care

## 2024-05-14 ENCOUNTER — Encounter: Admission: RE | Payer: Self-pay | Source: Home / Self Care

## 2024-05-14 SURGERY — SIGMOIDOSCOPY, FLEXIBLE
Anesthesia: General

## 2024-05-17 ENCOUNTER — Other Ambulatory Visit: Payer: Self-pay

## 2024-05-17 ENCOUNTER — Encounter: Payer: Self-pay | Admitting: Surgery

## 2024-05-17 ENCOUNTER — Ambulatory Visit
Admission: RE | Admit: 2024-05-17 | Discharge: 2024-05-17 | Disposition: A | Discharge status: Home / Self Care | Attending: Surgery | Admitting: Surgery

## 2024-05-17 ENCOUNTER — Encounter
Admission: RE | Disposition: A | Discharge status: Home / Self Care | Payer: Self-pay | Source: Home / Self Care | Attending: Surgery

## 2024-05-17 DIAGNOSIS — J449 Chronic obstructive pulmonary disease, unspecified: Secondary | ICD-10-CM | POA: Diagnosis not present

## 2024-05-17 DIAGNOSIS — T8484XA Pain due to internal orthopedic prosthetic devices, implants and grafts, initial encounter: Secondary | ICD-10-CM | POA: Insufficient documentation

## 2024-05-17 DIAGNOSIS — Y793 Surgical instruments, materials and orthopedic devices (including sutures) associated with adverse incidents: Secondary | ICD-10-CM | POA: Diagnosis not present

## 2024-05-17 DIAGNOSIS — F17210 Nicotine dependence, cigarettes, uncomplicated: Secondary | ICD-10-CM | POA: Insufficient documentation

## 2024-05-17 DIAGNOSIS — R197 Diarrhea, unspecified: Secondary | ICD-10-CM | POA: Insufficient documentation

## 2024-05-17 DIAGNOSIS — Z538 Procedure and treatment not carried out for other reasons: Secondary | ICD-10-CM | POA: Insufficient documentation

## 2024-05-17 DIAGNOSIS — Z7951 Long term (current) use of inhaled steroids: Secondary | ICD-10-CM | POA: Diagnosis not present

## 2024-05-17 DIAGNOSIS — J301 Allergic rhinitis due to pollen: Secondary | ICD-10-CM

## 2024-05-17 DIAGNOSIS — F3289 Other specified depressive episodes: Secondary | ICD-10-CM

## 2024-05-17 DIAGNOSIS — E1142 Type 2 diabetes mellitus with diabetic polyneuropathy: Secondary | ICD-10-CM

## 2024-05-17 SURGERY — REMOVAL, HARDWARE
Anesthesia: Choice | Site: Hand | Laterality: Left

## 2024-05-17 MED ORDER — LOPERAMIDE HCL 2 MG PO CAPS
2.0000 mg | ORAL_CAPSULE | Freq: Once | ORAL | Status: AC
  Administered 2024-05-17: 2 mg via ORAL
  Filled 2024-05-17 (×2): qty 1

## 2024-05-17 MED ORDER — MIDAZOLAM HCL 2 MG/2ML IJ SOLN
INTRAMUSCULAR | Status: AC
  Filled 2024-05-17: qty 2

## 2024-05-17 MED ORDER — FENTANYL CITRATE (PF) 100 MCG/2ML IJ SOLN
INTRAMUSCULAR | Status: AC
Start: 2024-05-17 — End: ?
  Filled 2024-05-17: qty 2

## 2024-05-17 MED ORDER — ORAL CARE MOUTH RINSE
15.0000 mL | Freq: Once | OROMUCOSAL | Status: AC
Start: 2024-05-17 — End: 2024-05-17

## 2024-05-17 MED ORDER — CHLORHEXIDINE GLUCONATE 0.12 % MT SOLN
15.0000 mL | Freq: Once | OROMUCOSAL | Status: AC
Start: 2024-05-17 — End: 2024-05-17
  Administered 2024-05-17: 15 mL via OROMUCOSAL

## 2024-05-17 MED ORDER — AZELASTINE HCL 137 MCG/SPRAY NA SOLN: 1.0000 | Freq: Every day | NASAL | Status: DC | PRN

## 2024-05-17 MED ORDER — ESCITALOPRAM OXALATE 20 MG PO TABS: 20.0000 mg | ORAL_TABLET | ORAL | Status: DC

## 2024-05-17 MED ORDER — CEFAZOLIN SODIUM-DEXTROSE 2-4 GM/100ML-% IV SOLN: 2.0000 g | INTRAVENOUS | Status: DC

## 2024-05-17 MED ORDER — LACTATED RINGERS IV SOLN: INTRAVENOUS | Status: DC

## 2024-05-17 MED ORDER — HYDROCODONE-ACETAMINOPHEN 5-325 MG PO TABS: 1.0000 | ORAL_TABLET | Freq: Four times a day (QID) | ORAL | 0 refills | Status: DC | PRN

## 2024-05-17 MED ORDER — FLUTICASONE PROPIONATE 50 MCG/ACT NA SUSP: 1.0000 | Freq: Every day | NASAL | Status: AC | PRN

## 2024-05-17 MED ORDER — CEFAZOLIN SODIUM-DEXTROSE 2-4 GM/100ML-% IV SOLN
INTRAVENOUS | Status: AC
  Filled 2024-05-17: qty 100

## 2024-05-17 MED ORDER — CHLORHEXIDINE GLUCONATE 0.12 % MT SOLN
OROMUCOSAL | Status: AC
  Filled 2024-05-17: qty 15

## 2024-05-17 NOTE — Anesthesia Preprocedure Evaluation (Addendum)
 Anesthesia Evaluation  Patient identified by MRN, date of birth, ID band Patient awake    Reviewed: Allergy & Precautions, H&P , NPO status , Patient's Chart, lab work & pertinent test results  Airway Mallampati: II  TM Distance: >3 FB Neck ROM: full    Dental  (+) Edentulous Upper, Edentulous Lower   Pulmonary shortness of breath, sleep apnea , COPD (prn supplemental oxygen ), Current Smoker   Pulmonary exam normal        Cardiovascular hypertension, Pt. on medications + Peripheral Vascular Disease and +CHF  Normal cardiovascular exam     Neuro/Psych  PSYCHIATRIC DISORDERS Anxiety      Neuromuscular disease (hand nueropathy) negative neurological ROS  negative psych ROS   GI/Hepatic Neg liver ROS,GERD  Medicated and Controlled,,  Endo/Other  Hypothyroidism    Renal/GU Renal InsufficiencyRenal disease     Musculoskeletal   Abdominal Normal abdominal exam  (+)   Peds  Hematology negative hematology ROS (+)   Anesthesia Other Findings Past Medical History: No date: (HFpEF) heart failure with preserved ejection fraction (HCC) 03/29/2011: Acute renal failure (HCC) 11/2020: Acute respiratory distress 04/14/2014: Acute respiratory failure (HCC) No date: Anxiety No date: Arthritis No date: CHF (congestive heart failure) (HCC) No date: Chronic pain syndrome No date: Chronic respiratory failure with hypoxia and hypercapnia  (HCC) No date: CKD stage 3b, GFR 30-44 ml/min (HCC) 01/10/2013: Community acquired pneumonia No date: COPD (chronic obstructive pulmonary disease) (HCC)     Comment:  no inhalers--smoker, no oxygen  No date: Depression No date: Dyspnea No date: Emphysema lung (HCC) No date: Failed back surgical syndrome No date: GERD (gastroesophageal reflux disease) No date: Hyperlipidemia No date: Hypertension No date: Hypothyroid No date: Lumbar spondylosis No date: Nicotine  dependence No date: Normocytic  anemia No date: OSA on CPAP No date: Peripheral vascular disease (HCC) No date: Pre-diabetes No date: Recurrent UTI (urinary tract infection) No date: Restless leg syndrome No date: Seizures (HCC)     Comment:  02/13/2011 -hx of seizure due to "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  no seizure since--pt does take               keppra  and office note from neurologist dr. Salli Crawley on               this chart No date: Sepsis due to pneumonia [facility]) No date: Sinus tachycardia No date: Urinary incontinence No date: Wound of left leg     Comment:  being seen at wound clinic as of 03-23-24  Past Surgical History: No date: CARPAL TUNNEL RELEASE     Comment:  left 06/17/2022: COLONOSCOPY WITH PROPOFOL ; N/A     Comment:  Procedure: COLONOSCOPY WITH PROPOFOL ;  Surgeon:               Shane Darling, MD;  Location: [facility] ENDOSCOPY;                Service: Endoscopy;  Laterality: N/A; 08/26/2011: JOINT REPLACEMENT     Comment:  right total knee arthroplasty, left total knee 10/28/2011: KNEE ARTHROPLASTY     Comment:  Procedure: COMPUTER ASSISTED TOTAL KNEE ARTHROPLASTY;  Surgeon: Annie Barton Rendall III;  Location: [facility] ORS;  Service:              Orthopedics;  Laterality: Left;  preop femoral nerve               block No date: KNEE SURGERY     Comment:  right No date: laminectomy and diskectomy     Comment:  L4-5 with fusion 01/30/2024: OPEN REDUCTION INTERNAL FIXATION (ORIF) METACARPAL; Left     Comment:  Procedure: OPEN REDUCTION INTERNAL FIXATION (ORIF) OF               LEFT 5TH METACARPAL SHAFT FRACTURE.;  Surgeon: Elner Hahn, MD;  Location: [facility] ORS;  Service: Orthopedics;                Laterality: Left; 06/18/2022: PROCTOSCOPY; N/A     Comment:  Procedure: RIGID PROCTOSCOPY;  Surgeon: Candyce Champagne,                MD;  Location: [facility] OR[facility];  Service: General;  Laterality:               N/A;  BMI    Body Mass Index: 25.99 kg/m      Reproductive/Obstetrics negative OB ROS                             Anesthesia Physical Anesthesia Plan  ASA: 3  Anesthesia Plan: General LMA   Post-op Pain Management: Ofirmev  IV (intra-op)* and Regional block*   Induction: Intravenous  PONV Risk Score and Plan: Dexamethasone  and Ondansetron   Airway Management Planned: LMA  Additional Equipment:   Intra-op Plan:   Post-operative Plan: Extubation in OR  Informed Consent: I have reviewed the patients History and Physical, chart, labs and discussed the procedure including the risks, benefits and alternatives for the proposed anesthesia with the patient or authorized representative who has indicated his/her understanding and acceptance.     Dental Advisory Given  Plan Discussed with: Anesthesiologist, CRNA and Surgeon  Anesthesia Plan Comments:         Anesthesia Quick Evaluation

## 2024-05-17 NOTE — Telephone Encounter (Signed)
 A user error has taken place: medication ordered in error, not dispensed to this patient.

## 2024-05-17 NOTE — Discharge Instructions (Signed)
 Orthopedic discharge instructions: Keep dressing dry and intact. Keep hand elevated above heart level. May shower after dressing removed on postop day 4 (Monday). Cover sutures with Band-Aids after drying off. Apply ice to affected area frequently. Take ES Tylenol  or pain medication as prescribed when needed.  Return for follow-up in 10-14 days or as scheduled.

## 2024-05-17 NOTE — H&P (Signed)
 History of Present Illness:  [name] is a 68 y.o. female who presents today for her surgical history and physical for upcoming hardware removal from the left fifth metacarpal. The patient is status post a intramedullary fixation of a displaced distal third left fifth metacarpal shaft fracture. She has not suffered any recent trauma or injury affecting left hand. She denies any signs of infection at home such as fevers chills or any drainage from the left hand incision site. The patient does have a history of COPD and does use a inhaler on a routine basis. She denies any history of heart attack or stroke. She denies any history of diabetes. She denies any history of DVT  Current Outpatient Medications:  acetaminophen  (TYLENOL ) 650 MG ER tablet Take 1,950 mg by mouth  albuterol  (PROVENTIL ) 2.5 mg /3 mL (0.083 %) nebulizer solution Take 3 mLs (2.5 mg total) by nebulization as directed for Wheezing Inhale 1 vial via nebulizer every 4-6 hrs as needed. 75 mL 3  albuterol  MDI, PROVENTIL , VENTOLIN , PROAIR , HFA 90 mcg/actuation inhaler Inhale 2 inhalations into the lungs every 6 (six) hours as needed for Wheezing 3 each 1  ARIPiprazole  (ABILIFY ) 2 MG tablet Take 2 mg by mouth every morning (Patient not taking: Reported on 05/03/2024)  azelastine  (ASTELIN ) 137 mcg nasal spray Place 1 spray into one nostril  budesonide -formoteroL (SYMBICORT) 160-4.5 mcg/actuation inhaler Inhale 2 inhalations into the lungs 2 (two) times daily 30.6 g 1  busPIRone  (BUSPAR ) 30 MG tablet Take 30 mg by mouth 2 (two) times daily  butalbital -acetaminophen -caffeine  (FIORICET) 50-325-40 mg tablet Take 1 tablet by mouth every 6 (six) hours as needed for Pain (Patient not taking: Reported on 05/03/2024)  ciclopirox (PENLAC) 8 % topical nail solution Apply topically at bedtime Apply over nail and surrounding skin. 6.6 mL 2  cloNIDine  (CATAPRES -TTS) 0.2 mg/24 hr patch Place 2 patches onto the skin every 7 (seven) days (Patient not taking:  Reported on 05/03/2024)  cloNIDine  HCL (CATAPRES ) 0.2 MG tablet Take 0.2 mg by mouth 2 (two) times daily  dilTIAZem  (CARDIZEM  CD) 180 MG CD capsule TAKE 1 CAPSULE BY MOUTH EVERY DAY 90 capsule 1  diphenhydrAMINE  (BENADRYL ) 25 mg tablet Take 50 mg by mouth  escitalopram  oxalate (LEXAPRO ) 20 MG tablet  famotidine  (PEPCID ) 40 MG tablet  FARXIGA  10 mg tablet Take 10 mg by mouth once daily  fenofibrate  micronized (LOFIBRA) 134 MG capsule TAKE 1 CAPSULE BY MOUTH DAILY WITH A MEAL  gabapentin  (NEURONTIN ) 800 MG tablet Take by mouth  hydrALAZINE  (APRESOLINE ) 10 MG tablet Take 10 mg by mouth 3 (three) times daily as needed (PLEASE TAKE IF BLOOD PRESSURE IS ABOVE 150/90. CAN REPEAT DOSE AS NEEDED UP TO 3 PER DAY)  L. acidophilus/Bifid. animalis 32 billion cell Cap Take 1 capsule by mouth once daily  levothyroxine  (SYNTHROID ) 25 MCG tablet  lidocaine -prilocaine (EMLA) cream Apply 2 to 3 grams (1 gram = 1 inch) to the affected area 4 times daily (Patient not taking: Reported on 05/03/2024)  minoxidiL  2.5 MG tablet Take 7.5 mg by mouth once daily  multivitamin with minerals tablet Take 1 tablet by mouth  mupirocin  (BACTROBAN ) 2 % ointment Apply topically 2 (two) times daily FOR 7 DAYS  olmesartan  (BENICAR ) 40 MG tablet Take 1 tablet by mouth once daily  oxyCODONE  (ROXICODONE ) 5 MG immediate release tablet Take 1 tablet (5 mg total) by mouth every 6 (six) hours as needed for Pain (Patient not taking: Reported on 05/03/2024) 30 tablet 0  pantoprazole  (PROTONIX ) 40 MG  DR tablet (Patient not taking: Reported on 05/03/2024)  pramipexole  (MIRAPEX ) 0.5 MG tablet Take 0.5 mg by mouth at bedtime  tiotropium bromide  (SPIRIVA  RESPIMAT) 2.5 mcg/actuation inhalation spray Inhale 2 inhalations (5 mcg total) into the lungs once daily 4 g 8  traZODone  (DESYREL ) 50 MG tablet Take 0.5-1 tablets by mouth at bedtime as needed (Patient not taking: Reported on 05/03/2024)  trimethoprim  100 mg tablet Take 100 mg by mouth once daily   UBRELVY  100 mg Tab Take 1 tablet by mouth once daily as needed (FOR HEADACHE. MAX 2 TABLETS DAILY) (Patient not taking: Reported on 05/03/2024)  urea (CEROVEL) 40 % topical cream Apply topically 2 (two) times daily Apply to thickened nails 2x daily 90 g 2   Allergies:  Acetaminophen  Other (Aspirin  - Dr. said do not take due to kidneys NSAIDs (Non-Steroidal Anti-Inflammatory Drug) Other (Pt states it messes up her kidneys)  Hydrocodone -Acetaminophen  Nausea And Vomiting, Other (See Comments) and Unknown  B[place]-Hydran Other (See Comments)  Thiazides Other (Unknown)  Hydrocodone  Itching   Past Medical History:  Allergy  Anxiety  Arthritis  CHF exacerbation (CMS/HHS-HCC) 12/28/2019  COPD (chronic obstructive pulmonary disease) (CMS/HHS-HCC)  Diabetes mellitus, type 2 (CMS/HHS-HCC) 01/10/2013  GERD (gastroesophageal reflux disease)  Hypertension  Incontinence  Obesity  Sinusitis, unspecified  Thyroid  disease   Past Surgical History:  Colon @ [facility] 06/17/2022 (Tubular adenomas/Flex Sig repeat 6 months/CTL)  ORIF METACARPAL FRACTURE Left 01/30/2024  5TH METACARPAL- DR. Clementina Mareno  Back Surgery - 8 infusions  BRONCHOSCOPY  Left Hand Carpal Tunnel Release  REPLACEMENT TOTAL KNEE BILATERAL   Family History:  Skin cancer Mother Darcas Essex  Bronchiolitis Mother Tracy Gerken  Coronary Artery Disease (Blocked arteries around heart) Father  Hyperlipidemia (Elevated cholesterol) Sister Richardean Chancellor  High blood pressure (Hypertension) Sister Richardean Chancellor  Baby  Diabetes Sister Richardean Chancellor  COPD Sister Richardean Chancellor  Emphysema Brother Tarri Farm   Social History:   Socioeconomic History:  Marital status: Widowed  Tobacco Use  Smoking status: Every Day  Current packs/day: 1.50  Average packs/day: 1.5 packs/day for 50.0 years (75.0 ttl pk-yrs)  Types: Cigarettes  Smokeless tobacco: Never  Tobacco comments:  10/04/23- 1 pack of cigarettes BVM  Vaping  Use  Vaping status: Never Used  Substance and Sexual Activity  Alcohol  use: Not Currently  Alcohol /week: 2.0 standard drinks of alcohol   Types: 2 Glasses of wine per week  Drug use: Never  Sexual activity: Yes  Partners: Male  Birth control/protection: Other-see comments, Post-menopausal   Social Drivers of Health:   Physicist, medical Strain: Low Risk (03/09/2024)  Overall Financial Resource Strain (CARDIA)  Difficulty of Paying Living Expenses: Not hard at all  Food Insecurity: No Food Insecurity (04/02/2024)  Received from [facility]  Hunger Vital Sign  Worried About Running Out of Food in the Last Year: Never true  Ran Out of Food in the Last Year: Never true  Transportation Needs: No Transportation Needs (04/02/2024)  [facility] - Transportation  Lack of Transportation (Medical): No  Lack of Transportation (Non-Medical): No  Recent Concern: Transportation Needs - Unmet Transportation Needs (01/25/2024)  PRAPARE - Risk analyst (Medical): Yes  Lack of Transportation (Non-Medical): Yes   Review of Systems:  A comprehensive 14 point ROS was performed, reviewed, and the pertinent orthopaedic findings are documented in the HPI.  Physical Exam: Vitals:  05/10/24 1501  BP: (!) 140/86  Weight: 72.1 kg (159 lb)  Height: 167.6 cm (5\' 6" )  PainSc: 7  PainLoc: Finger   General/Constitutional: The patient appears to be well-nourished, well-developed, and in no acute distress. Neuro/Psych: Normal mood and affect, oriented to person, place and time. Eyes: Non-icteric. Pupils are equal, round, and reactive to light, and exhibit synchronous movement. ENT: Unremarkable. Lymphatic: No palpable adenopathy. Respiratory: Lungs clear to auscultation, Normal chest excursion, No wheezes, and Non-labored breathing Cardiovascular: Regular rate and rhythm. No murmurs. and No edema, swelling or tenderness, except as noted in detailed  exam. Integumentary: No impressive skin lesions present, except as noted in detailed exam. Musculoskeletal: Unremarkable, except as noted in detailed exam.  Left hand exam: Skin inspection of the left hand demonstrates her surgical incision to be well-healed and without evidence for infection. No swelling, erythema, ecchymosis, abrasions, or other skin abnormalities are identified. Her left fifth metacarpal and left fifth digit both appear to be well aligned and without any rotational deformity. She is able to actively flex and extend all digits without any pain or triggering, although she is limited in her ability to flex the little MCP and PIP joints. MCP motion is approximately 0 to 50 degrees whereas the PIP motion is 0 to 75 degrees. She is neurovascularly intact to all digits.  Skin examination right hip demonstrates no open wound, erythema or ecchymosis. The patient does not have any pain with logroll maneuver of the right hip. No pain with internal or external rotation with the hip flexed. She does have moderate tenderness to palpation along the right greater trochanteric bursa along the IT band.  X-rays/MRI/Lab data:  AP, lateral, and oblique views of the left hand obtained at her previous visit reviewed by me today.. These films demonstrate satisfactory alignment of the fifth metacarpal with evidence of good interval healing of the fracture. The intramedullary screw appears to be in satisfactory position, although it is somewhat prominent at the metacarpal head.   Assessment: 1. Painful retained surgical hardware S/P ORIF of left fifth metacarpal shaft fracture.   Plan: 1. Treatment options were discussed today with the patient. 2. The patient is experiencing what believed to be greater trochanteric bursitis involving the right hip. The patient was offered and received a right greater trochanteric bursa steroid injection. 3. The patient is scheduled to undergo a hardware removal from her  left fifth metacarpal. The patient was instructed on risk and benefits of surgical intervention. 4. This document will serve as a surgical history and physical for the patient. 5. She will follow-up per center postop protocol.  The procedure was discussed with the patient, as were the potential risks (including bleeding, infection, nerve and/or blood vessel injury, persistent or recurrent pain, recurrent fracture, malunion or nonunion, stiffness of the fifth MCP and/or PIP joints development of degenerative joint disease,, need for further surgery, blood clots, strokes, heart attacks and/or arhythmias, pneumonia, etc.) and benefits. The patient states her understanding and wishes to proceed.    H&P reviewed and patient re-examined. No changes.

## 2024-05-21 ENCOUNTER — Ambulatory Visit
Admission: RE | Admit: 2024-05-21 | Discharge: 2024-05-21 | Disposition: A | Discharge status: Home / Self Care | Source: Ambulatory Visit | Attending: Neurology | Admitting: Neurology

## 2024-05-21 DIAGNOSIS — M5412 Radiculopathy, cervical region: Secondary | ICD-10-CM

## 2024-05-21 DIAGNOSIS — R200 Anesthesia of skin: Secondary | ICD-10-CM

## 2024-05-24 NOTE — Progress Notes (Signed)
 The patient was brought into the hospital in preparation for an elective procedure to remove painful retained hardware from the fifth metacarpal of her left hand.  Apparently, the patient developed diarrhea the night before presenting to the hospital, but did not tell anybody prior to arriving.  After she spent a good portion of the hour to hour and a half prior to her surgery in the bathroom, and tracking stool over the floor into the preop area, it was elected to postpone her surgery.

## 2024-05-29 ENCOUNTER — Other Ambulatory Visit: Payer: Self-pay | Admitting: Internal Medicine

## 2024-05-29 DIAGNOSIS — E782 Mixed hyperlipidemia: Secondary | ICD-10-CM

## 2024-05-31 ENCOUNTER — Ambulatory Visit: Admitting: Student in an Organized Health Care Education/Training Program

## 2024-06-05 ENCOUNTER — Ambulatory Visit: Admitting: Internal Medicine

## 2024-06-11 ENCOUNTER — Encounter: Payer: Self-pay | Admitting: Internal Medicine

## 2024-06-11 ENCOUNTER — Ambulatory Visit (INDEPENDENT_AMBULATORY_CARE_PROVIDER_SITE_OTHER): Admitting: Internal Medicine

## 2024-06-11 VITALS — BP 160/100 | HR 76 | Ht 66.0 in | Wt 175.6 lb

## 2024-06-11 DIAGNOSIS — M5412 Radiculopathy, cervical region: Secondary | ICD-10-CM | POA: Diagnosis not present

## 2024-06-11 DIAGNOSIS — Z01818 Encounter for other preprocedural examination: Secondary | ICD-10-CM | POA: Diagnosis not present

## 2024-06-11 DIAGNOSIS — I10 Essential (primary) hypertension: Secondary | ICD-10-CM | POA: Diagnosis not present

## 2024-06-11 NOTE — Progress Notes (Signed)
 Established Patient Office Visit  Subjective:  Patient ID: [name], female    DOB: [birth year]  Age: 68 y.o. MRN: [id number]  Chief Complaint  Patient presents with   Acute Visit    Falling asleep frequently     C/o falling asleep easily, denies noncompliance with CPAP. Also here for preop clearance for hardware removal.    No other concerns at this time.   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)    Community acquired pneumonia 01/10/2013   COPD (chronic obstructive pulmonary disease) (HCC)    no inhalers--smoker, no oxygen    Depression    Dyspnea    Emphysema lung (HCC)    Failed back surgical syndrome    GERD (gastroesophageal reflux disease)    Hyperlipidemia    Hypertension    Hypothyroid    Lumbar spondylosis    Nicotine  dependence    Normocytic anemia    OSA on CPAP    Peripheral vascular disease (HCC)    Pre-diabetes    Recurrent UTI (urinary tract infection)    Restless leg syndrome    Seizures (HCC)    02/13/2011 -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  no seizure since--pt does take keppra  and office note from neurologist dr. margaret on this chart   Sepsis due to pneumonia [facility])    Sinus tachycardia    Urinary incontinence    Wound of left leg    being seen at wound clinic as of 03-23-24    Past Surgical History:  Procedure Laterality Date   CARPAL TUNNEL RELEASE     left   COLONOSCOPY WITH PROPOFOL  N/A 06/17/2022   Procedure: COLONOSCOPY WITH  PROPOFOL ;  Surgeon: Maryruth Ole DASEN, MD;  Location: [facility] ENDOSCOPY;  Service: Endoscopy;  Laterality: N/A;   JOINT REPLACEMENT  08/26/2011   right total knee arthroplasty, left total knee   KNEE ARTHROPLASTY  10/28/2011   Procedure: COMPUTER ASSISTED TOTAL KNEE ARTHROPLASTY;  Surgeon: Norleen CROME Rendall III;  Location: [facility] ORS;  Service: Orthopedics;  Laterality: Left;  preop femoral nerve block   KNEE SURGERY     right   laminectomy and diskectomy     L4-5 with fusion   OPEN REDUCTION INTERNAL FIXATION (ORIF) METACARPAL Left 01/30/2024   Procedure: OPEN REDUCTION INTERNAL FIXATION (ORIF) OF LEFT 5TH METACARPAL SHAFT FRACTURE.;  Surgeon: Edie Norleen PARAS, MD;  Location: [facility] ORS;  Service: Orthopedics;  Laterality: Left;   PROCTOSCOPY N/A 06/18/2022   Procedure: RIGID PROCTOSCOPY;  Surgeon: Sheldon Standing, MD;  Location: [facility] ORS;  Service: General;  Laterality: N/A;    Social History   Socioeconomic History   Marital status: Widowed    Spouse name: Not on file   Number of children: Not on file   Years of education: Not on file   Highest education level: Not on file  Occupational History   Not on file  Tobacco Use   Smoking status: Every Day  Current packs/day: 2.00    Average packs/day: 2.0 packs/day for 50.0 years (100.0 ttl pk-yrs)    Types: Cigarettes    Passive exposure: Current   Smokeless tobacco: Never  Vaping Use   Vaping status: Every Day   Substances: Nicotine , Flavoring  Substance and Sexual Activity   Alcohol  use: Yes    Comment: occasional   Drug use: No   Sexual activity: Yes    Birth control/protection: Post-menopausal  Other Topics Concern   Not on file  Social History Narrative   ** Merged History Encounter **       Social Drivers of Health   Financial Resource Strain: Low Risk  (03/09/2024)   Received from [facility]   Overall Financial Resource Strain (CARDIA)    Difficulty of Paying Living Expenses: Not hard at all  Food Insecurity:  No Food Insecurity (04/02/2024)   Hunger Vital Sign    Worried About Running Out of Food in the Last Year: Never true    Ran Out of Food in the Last Year: Never true  Transportation Needs: No Transportation Needs (04/02/2024)   PRAPARE - Administrator, Civil Service (Medical): No    Lack of Transportation (Non-Medical): No  Recent Concern: Transportation Needs - Unmet Transportation Needs (01/25/2024)   Received from [facility] - Transportation    In the past 12 months, has lack of transportation kept you from medical appointments or from getting medications?: Yes    Lack of Transportation (Non-Medical): Yes  Physical Activity: Not on file  Stress: Not on file  Social Connections: Socially Isolated (03/28/2024)   Social Connection and Isolation Panel    Frequency of Communication with Friends and Family: More than three times a week    Frequency of Social Gatherings with Friends and Family: More than three times a week    Attends Religious Services: Never    Database administrator or Organizations: No    Attends Banker Meetings: Never    Marital Status: Widowed  Intimate Partner Violence: Not At Risk (04/02/2024)   Humiliation, Afraid, Rape, and Kick questionnaire    Fear of Current or Ex-Partner: No    Emotionally Abused: No    Physically Abused: No    Sexually Abused: No    Family History  Problem Relation Age of Onset   Heart attack Father 62   Alzheimer'Machell Wirthlin disease Mother    Breast cancer Neg Hx     Allergies  Allergen Reactions   Nsaids Other (See Comments)    Pt states it messes up her kidneys But takes aspirin  & ibuprofen   Thiazide-Type Diuretics     Other reaction(Kingjames Coury): Unknown    Outpatient Medications Prior to Visit  Medication Sig   acetaminophen  (TYLENOL ) 650 MG CR tablet Take 1,950 mg by mouth every 4 (four) hours.   albuterol  (VENTOLIN  HFA) 108 (90 Base) MCG/ACT inhaler Inhale 2 puffs into the lungs every 4  (four) hours as needed for wheezing or shortness of breath.   Azelastine  HCl 137 MCG/SPRAY SOLN Place 1 spray into the nose daily as needed (Congestion/ allergies).   budesonide -formoterol (SYMBICORT) 160-4.5 MCG/ACT inhaler Inhale 2 puffs into the lungs 2 (two) times daily.   busPIRone  (BUSPAR ) 30 MG tablet TAKE 1 TABLET BY MOUTH 2 TIMES DAILY.   Cholecalciferol  (VITAMIN D -3) 25 MCG (1000 UT) CAPS Take by mouth.   ciclopirox (PENLAC) 8 % solution Apply 1 application  topically at bedtime.  cloNIDine  (CATAPRES ) 0.2 MG tablet TAKE 1 TABLET BY MOUTH TWICE A DAY   cyanocobalamin  (VITAMIN B12) 1000 MCG tablet Take 1,000 mcg by mouth daily.   diltiazem  (CARDIZEM  CD) 180 MG 24 hr capsule Take 180 mg by mouth every morning.   escitalopram  (LEXAPRO ) 20 MG tablet Take 1 tablet (20 mg total) by mouth every morning.   fenofibrate  micronized (LOFIBRA) 134 MG capsule TAKE 1 CAPSULE BY MOUTH DAILY BEFORE BREAKFAST.   fluticasone  (FLONASE ) 50 MCG/ACT nasal spray Place 1 spray into both nostrils daily as needed for allergies.   gabapentin  (NEURONTIN ) 800 MG tablet Take 1 tablet (800 mg total) by mouth 3 (three) times daily.   hydrALAZINE  (APRESOLINE ) 10 MG tablet Take 1 tablet (10 mg total) by mouth 3 (three) times daily as needed. Please take if your blood pressure is above 150/90. You can repeat the dose as needed up with up to 3 pills per day. If your blood pressure is not improving with this, please call your doctor'[facility] office.   levothyroxine  (SYNTHROID ) 25 MCG tablet TAKE 1 TABLET BY MOUTH EVERY DAY BEFORE BREAKFAST   minoxidil  (LONITEN ) 2.5 MG tablet Take 3 tablets (7.5 mg total) by mouth daily.   Multiple Vitamins-Minerals (MULTIVITAMIN WITH MINERALS) tablet Take 1 tablet by mouth daily. 50 + woman   olmesartan  (BENICAR ) 40 MG tablet Take 40 mg by mouth every morning.   OXYGEN  Inhale 3 L into the lungs at bedtime.   pantoprazole  (PROTONIX ) 40 MG tablet TAKE 1 TABLET BY MOUTH EVERY DAY IN THE MORNING    pramipexole  (MIRAPEX ) 0.5 MG tablet TAKE 1 TABLET BY MOUTH AT BEDTIME.   terbinafine  (LAMISIL ) 250 MG tablet Take 250 mg by mouth daily.   acidophilus (RISAQUAD) CAPS capsule Take 1 capsule by mouth daily.   diphenhydrAMINE  (BENADRYL ) 25 MG tablet Take 50 mg by mouth daily as needed for allergies. (Patient not taking: Reported on 06/11/2024)   HYDROcodone -acetaminophen  (NORCO/VICODIN) 5-325 MG tablet Take 1-2 tablets by mouth every 6 (six) hours as needed for moderate pain (pain score 4-6) or severe pain (pain score 7-10).   NON FORMULARY Pt uses a c-pap nightly w/ oxygen    Facility-Administered Medications Prior to Visit  Medication Dose Route Frequency Provider   chlorhexidine  (HIBICLENS ) 4 % liquid 4 application  60 mL Topical Once Duffy, Karin, PA-C   chlorhexidine  (HIBICLENS ) 4 % liquid 4 application  60 mL Topical Once Duffy, Wonda, PA-C    Review of Systems  Constitutional: Negative.   HENT: Negative.    Eyes: Negative.   Respiratory:  Positive for shortness of breath (on exertion).   Cardiovascular: Negative.   Gastrointestinal:  Positive for constipation.  Genitourinary: Negative.   Musculoskeletal:  Positive for back pain and falls.  Skin: Negative.   Neurological:  Positive for sensory change and headaches (chronic).  Endo/Heme/Allergies: Negative.   Psychiatric/Behavioral: Negative.         Objective:   BP (!) 160/100   Pulse 76   Ht 5' 6 (1.676 m)   Wt 175 lb 9.6 oz (79.7 kg)   SpO2 90%   BMI 28.34 kg/m   Vitals:   06/11/24 1458  BP: (!) 160/100  Pulse: 76  Height: 5' 6 (1.676 m)  Weight: 175 lb 9.6 oz (79.7 kg)  SpO2: 90%  BMI (Calculated): 28.36    Physical Exam Vitals reviewed.  Constitutional:      General: She is not in acute distress.    Comments: Seated in a wheelchair  HENT:  Head: Normocephalic.     Nose: Nose normal.     Mouth/Throat:     Mouth: Mucous membranes are moist.   Eyes:     Extraocular Movements: Extraocular movements  intact.     Pupils: Pupils are equal, round, and reactive to light.    Cardiovascular:     Rate and Rhythm: Normal rate and regular rhythm.     Heart sounds: No murmur heard. Pulmonary:     Effort: Pulmonary effort is normal.     Breath sounds: Decreased air movement present. No rhonchi or rales.  Abdominal:     General: Abdomen is flat.     Palpations: There is no hepatomegaly, splenomegaly or mass.     Tenderness: There is no guarding or rebound.   Musculoskeletal:        General: Normal range of motion.     Right hand: Swelling and deformity present.     Cervical back: Normal range of motion. No tenderness.   Skin:    General: Skin is warm and dry.     Comments: Left leg wound dressed   Neurological:     General: No focal deficit present.     Mental Status: She is alert and oriented to person, place, and time.     Cranial Nerves: No cranial nerve deficit.     Motor: No weakness.   Psychiatric:        Mood and Affect: Mood normal.        Behavior: Behavior normal.      No results found for any visits on 06/11/24.      Assessment & Plan:  Low risk for procedure and cleared. Problem List Items Addressed This Visit       Cardiovascular and Mediastinum   Hypertension     Nervous and Auditory   Cervical radiculopathy   Other Visit Diagnoses       Preoperative clearance    -  Primary       Return if symptoms worsen or fail to improve.   Total time spent: 20 minutes  Sherrill Cinderella Perry, MD  06/11/2024   This document may have been prepared by [facility] Voice Recognition software and as such may include unintentional dictation errors.

## 2024-06-12 ENCOUNTER — Ambulatory Visit: Admitting: Internal Medicine

## 2024-06-12 DIAGNOSIS — N189 Chronic kidney disease, unspecified: Secondary | ICD-10-CM | POA: Diagnosis not present

## 2024-06-12 DIAGNOSIS — Z9181 History of falling: Secondary | ICD-10-CM | POA: Diagnosis not present

## 2024-06-12 DIAGNOSIS — S6290XS Unspecified fracture of unspecified wrist and hand, sequela: Secondary | ICD-10-CM | POA: Diagnosis not present

## 2024-06-12 DIAGNOSIS — R2689 Other abnormalities of gait and mobility: Secondary | ICD-10-CM | POA: Diagnosis not present

## 2024-06-14 ENCOUNTER — Telehealth: Payer: Self-pay

## 2024-06-14 NOTE — Telephone Encounter (Signed)
 Patient called asking for her restless legs medication to be increased from once daily to twice daily, did not specify the medication she's requesting

## 2024-06-18 ENCOUNTER — Other Ambulatory Visit: Payer: Self-pay | Admitting: Internal Medicine

## 2024-06-18 ENCOUNTER — Telehealth: Payer: Self-pay

## 2024-06-18 DIAGNOSIS — J449 Chronic obstructive pulmonary disease, unspecified: Secondary | ICD-10-CM | POA: Diagnosis not present

## 2024-06-18 DIAGNOSIS — G2581 Restless legs syndrome: Secondary | ICD-10-CM

## 2024-06-18 MED ORDER — PRAMIPEXOLE DIHYDROCHLORIDE 0.25 MG PO TABS: 0.2500 mg | ORAL_TABLET | Freq: Three times a day (TID) | ORAL | 1 refills | Status: DC

## 2024-06-18 NOTE — Telephone Encounter (Signed)
 Pt called saying her 0.5 mg pramipexole  is not working and she is experiencing restless leg/foot 24/7. Pt is wondering if there is something else she can take instead. Please advise.

## 2024-06-18 NOTE — Telephone Encounter (Signed)
 Pt informed to try taking 0.25 mg tid.

## 2024-06-18 NOTE — Telephone Encounter (Signed)
 Pt informed. Just updated her pharmacy!

## 2024-06-21 ENCOUNTER — Other Ambulatory Visit: Payer: Self-pay | Admitting: Internal Medicine

## 2024-06-21 DIAGNOSIS — F3289 Other specified depressive episodes: Secondary | ICD-10-CM

## 2024-06-21 DIAGNOSIS — E782 Mixed hyperlipidemia: Secondary | ICD-10-CM

## 2024-06-21 DIAGNOSIS — M47816 Spondylosis without myelopathy or radiculopathy, lumbar region: Secondary | ICD-10-CM

## 2024-06-21 DIAGNOSIS — K219 Gastro-esophageal reflux disease without esophagitis: Secondary | ICD-10-CM

## 2024-06-21 DIAGNOSIS — I10 Essential (primary) hypertension: Secondary | ICD-10-CM

## 2024-06-22 ENCOUNTER — Telehealth: Payer: Self-pay

## 2024-06-22 NOTE — Telephone Encounter (Signed)
 Pt called saying restless leg medication recently prescribed is not helping. Please advise.

## 2024-06-27 DIAGNOSIS — F17210 Nicotine dependence, cigarettes, uncomplicated: Secondary | ICD-10-CM | POA: Diagnosis not present

## 2024-06-27 DIAGNOSIS — Z9981 Dependence on supplemental oxygen: Secondary | ICD-10-CM | POA: Diagnosis not present

## 2024-06-27 DIAGNOSIS — J439 Emphysema, unspecified: Secondary | ICD-10-CM | POA: Diagnosis not present

## 2024-06-27 DIAGNOSIS — R0602 Shortness of breath: Secondary | ICD-10-CM | POA: Diagnosis not present

## 2024-06-28 ENCOUNTER — Encounter: Payer: Self-pay | Admitting: Neurosurgery

## 2024-06-28 NOTE — Progress Notes (Signed)
 Referring Physician:  Elisabeth Gwendlyn SAUNDERS, NP [address] [place],  [place] [address]  Primary Physician:  Rachel GORMAN Dine, MD  History of Present Illness: 07/03/2024 [name] is here today with a chief complaint of cervical myelopathy who presents with numbness in her hands and worsening leg weakness. She is accompanied by her boyfriend, who is described as helpful.  Numbness in her fingertips has been present since around Easter, associated with difficulty holding items and frequent dropping. Leg weakness has worsened over the past two to three weeks, leading to increased difficulty with walking and balance. She uses a walking stick regularly but has not experienced any recent falls.  Occasional neck pain is reported. She underwent knee surgery last month, and a scar on her knee affects her reflexes.  She has emphysema and chronic kidney disease, requiring three liters of oxygen  at night along with CPAP. She has a long history of smoking and attempted to quit a couple of weeks ago.   Bowel/Bladder Dysfunction: none  Conservative measures:  Physical therapy:  has not participated in Multimodal medical therapy including regular antiinflammatories: Tylenol , Lidocaine  cream, Gabapentin , Oxycodone   Injections:  05/10/2024-right greater trochanteric bursa steroid injection 02/25/2023-Sacroiliac Joint Steroid Injection   Past Surgery:  Back Surgery -laminectomy and diskectomy L4-5 with fusion and L3-S1 fusion   [name] has clear and progressive symptoms of cervical myelopathy.  The symptoms are causing a significant impact on the patient's life.   I have utilized the care everywhere function in epic to review the outside records available from external health systems.  Review of Systems:  A 10 point review of systems is negative, except for the pertinent positives and negatives detailed in the HPI.  Past Medical History: Past Medical History:   Diagnosis Date   (HFpEF) heart failure with preserved ejection fraction (HCC)    Acute renal failure (HCC) 03/29/2011   Acute respiratory distress 11/2020   Acute respiratory failure (HCC) 04/14/2014   Anxiety    Arthritis    CHF (congestive heart failure) (HCC)    Chronic pain syndrome    Chronic respiratory failure with hypoxia and hypercapnia (HCC)    CKD stage 3b, GFR 30-44 ml/min (HCC)    Community acquired pneumonia 01/10/2013   COPD (chronic obstructive pulmonary disease) (HCC)    no inhalers--smoker, no oxygen    Depression    Dyspnea    Emphysema lung (HCC)    Failed back surgical syndrome    GERD (gastroesophageal reflux disease)    Hyperlipidemia    Hypertension    Hypothyroid    Lumbar spondylosis    Nicotine  dependence    Normocytic anemia    OSA on CPAP    Peripheral vascular disease (HCC)    Pre-diabetes    Recurrent UTI (urinary tract infection)    Restless leg syndrome    Seizures (HCC)    02/13/2011 -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  no seizure since--pt does take keppra  and office note from neurologist dr. margaret on this chart   Sepsis due to pneumonia [facility])    Sinus tachycardia    Urinary incontinence    Wound of left leg    being seen at wound clinic as of 03-23-24    Past Surgical History: Past Surgical History:  Procedure Laterality Date   CARPAL TUNNEL RELEASE  left   COLONOSCOPY WITH PROPOFOL  N/A 06/17/2022   Procedure: COLONOSCOPY WITH PROPOFOL ;  Surgeon: Rachel Ole DASEN, MD;  Location: [facility];  Service: Endoscopy;  Laterality: N/A;   JOINT REPLACEMENT  08/26/2011   right total knee arthroplasty, left total knee   KNEE ARTHROPLASTY  10/28/2011   Procedure: COMPUTER ASSISTED TOTAL KNEE ARTHROPLASTY;  Surgeon: [name];  Location: [facility];  Service: Orthopedics;  Laterality: Left;  preop  femoral nerve block   KNEE SURGERY     right   laminectomy and diskectomy     L4-5 with fusion   OPEN REDUCTION INTERNAL FIXATION (ORIF) METACARPAL Left 01/30/2024   Procedure: OPEN REDUCTION INTERNAL FIXATION (ORIF) OF LEFT 5TH METACARPAL SHAFT FRACTURE.;  Surgeon: Rachel Rachel PARAS, MD;  Location: [facility] ORS;  Service: Orthopedics;  Laterality: Left;   PROCTOSCOPY N/A 06/18/2022   Procedure: RIGID PROCTOSCOPY;  Surgeon: Rachel Standing, MD;  Location: [facility];  Service: General;  Laterality: N/A;    Allergies: Allergies as of 07/03/2024 - Review Complete 06/11/2024  Allergen Reaction Noted   Nsaids Other (See Comments) 04/19/2011   Thiazide-type diuretics  05/27/2022    Medications:  Current Outpatient Medications:    acetaminophen  (TYLENOL ) 650 MG CR tablet, Take 1,950 mg by mouth every 4 (four) hours., Disp: , Rfl:    albuterol  (VENTOLIN  HFA) 108 (90 Base) MCG/ACT inhaler, Inhale 2 puffs into the lungs every 4 (four) hours as needed for wheezing or shortness of breath., Disp: 18 g, Rfl: 11   Azelastine  HCl 137 MCG/SPRAY SOLN, Place 1 spray into the nose daily as needed (Congestion/ allergies)., Disp: , Rfl:    budesonide -formoterol (SYMBICORT) 160-4.5 MCG/ACT inhaler, Inhale 2 puffs into the lungs 2 (two) times daily., Disp: , Rfl:    busPIRone  (BUSPAR ) 30 MG tablet, TAKE 1 TABLET BY MOUTH 2 TIMES DAILY., Disp: 180 tablet, Rfl: 1   Cholecalciferol  (VITAMIN D -3) 25 MCG (1000 UT) CAPS, Take by mouth., Disp: , Rfl:    ciclopirox (PENLAC) 8 % solution, Apply 1 application  topically at bedtime., Disp: , Rfl:    cloNIDine  (CATAPRES ) 0.2 MG tablet, TAKE 1 TABLET BY MOUTH TWICE A DAY, Disp: 180 tablet, Rfl: 0   cyanocobalamin  (VITAMIN B12) 1000 MCG tablet, TAKE 1 TABLET BY MOUTH EVERY DAY *NEW PRESCRIPTION REQUEST*, Disp: 90 tablet, Rfl: 3   diltiazem  (CARDIZEM  CD) 180 MG 24 hr capsule, Take 180 mg by mouth every morning., Disp: , Rfl:    escitalopram  (LEXAPRO ) 20 MG tablet, Take 1 tablet (20 mg  total) by mouth every morning., Disp: , Rfl:    fenofibrate  micronized (LOFIBRA) 134 MG capsule, TAKE 1 CAPSULE BY MOUTH DAILY BEFORE BREAKFAST., Disp: 90 capsule, Rfl: 0   fluticasone  (FLONASE ) 50 MCG/ACT nasal spray, Place 1 spray into both nostrils daily as needed for allergies., Disp: , Rfl:    gabapentin  (NEURONTIN ) 800 MG tablet, Take 1 tablet (800 mg total) by mouth 3 (three) times daily., Disp: 270 tablet, Rfl: 0   hydrALAZINE  (APRESOLINE ) 10 MG tablet, Take 1 tablet (10 mg total) by mouth 3 (three) times daily as needed. Please take if your blood pressure is above 150/90. You can repeat the dose as needed up with up to 3 pills per day. If your blood pressure is not improving with this, please call your doctor's office., Disp: 30 tablet, Rfl: 0   levothyroxine  (SYNTHROID ) 25 MCG tablet, TAKE 1 TABLET BY MOUTH EVERY DAY *NEW PRESCRIPTION REQUEST*, Disp: 90 tablet, Rfl: 1   minoxidil  (  LONITEN ) 2.5 MG tablet, Take 3 tablets (7.5 mg total) by mouth daily., Disp: 90 tablet, Rfl: 2   Multiple Vitamins-Minerals (MULTIVITAMIN WITH MINERALS) tablet, Take 1 tablet by mouth daily. 50 + woman, Disp: , Rfl:    NON FORMULARY, Pt uses a c-pap nightly w/ oxygen , Disp: , Rfl:    olmesartan  (BENICAR ) 40 MG tablet, Take 40 mg by mouth every morning., Disp: , Rfl:    OXYGEN , Inhale 3 L into the lungs at bedtime., Disp: , Rfl:    pantoprazole  (PROTONIX ) 40 MG tablet, TAKE 1 TABLET BY MOUTH EVERY DAY IN THE MORNING, Disp: 90 tablet, Rfl: 1   pramipexole  (MIRAPEX ) 0.25 MG tablet, Take 1 tablet (0.25 mg total) by mouth 3 (three) times daily., Disp: 270 tablet, Rfl: 1   pyridOXINE (VITAMIN B6) 100 MG tablet, TAKE 1 TABLET BY MOUTH EVERY DAY *NEW PRESCRIPTION REQUEST*, Disp: 90 tablet, Rfl: 3   terbinafine  (LAMISIL ) 250 MG tablet, Take 250 mg by mouth daily., Disp: , Rfl:    TRELEGY ELLIPTA 100-62.5-25 MCG/ACT AEPB, Inhale 1 puff into the lungs., Disp: , Rfl:  No current facility-administered medications for this  visit.  Facility-Administered Medications Ordered in Other Visits:    chlorhexidine  (HIBICLENS ) 4 % liquid 4 application, 60 mL, Topical, Once, Duffy, Wonda, PA-C   chlorhexidine  (HIBICLENS ) 4 % liquid 4 application, 60 mL, Topical, Once, Duffy, Wonda, PA-C  Social History: Social History   Tobacco Use   Smoking status: Every Day    Current packs/day: 2.00    Average packs/day: 2.0 packs/day for 50.0 years (100.0 ttl pk-yrs)    Types: Cigarettes    Passive exposure: Current   Smokeless tobacco: Never  Vaping Use   Vaping status: Every Day   Substances: Nicotine , Flavoring  Substance Use Topics   Alcohol  use: Yes    Comment: occasional   Drug use: No    Family Medical History: Family History  Problem Relation Age of Onset   Heart attack Father 18   Alzheimer's disease Mother    Breast cancer Neg Hx     Physical Examination: Vitals:   07/03/24 1515  BP: (!) 196/100    General: Patient is in no apparent distress. Attention to examination is appropriate.  Neck:   Supple.  Full range of motion.  Respiratory: Patient is breathing without any difficulty.   NEUROLOGICAL:     Awake, alert, oriented to person, place, and time.  Speech is clear and fluent.   Cranial Nerves: Pupils equal round and reactive to light.  Facial tone is symmetric.  Facial sensation is symmetric. Shoulder shrug is symmetric. Tongue protrusion is midline.  There is no pronator drift.  Strength: Side Biceps Triceps Deltoid Interossei Grip Wrist Ext. Wrist Flex.  R 5 5 4+ 4- 4- 4 4  L 5 5 4+ 4- 4- 4 4   Side Iliopsoas Quads Hamstring PF DF EHL  R 5 5 5 5 5 5   L 5 5 5 5 5 5    Reflexes are 32+ and symmetric at the biceps, triceps, brachioradialis, 1+patella and 2+achilles.   Hoffman's is present.   Bilateral upper and lower extremity sensation is intact to light touch.    No evidence of dysmetria noted.  Gait is wide-based and requires cane.  She cannot perform tandem gait.     Medical  Decision Making  Imaging: MR C spine 06/10/2024  IMPRESSION: 1. Advanced cervical disc and facet degeneration with severe spinal stenosis at C3-4 and C6-7 and moderate spinal  stenosis at C4-5 and C5-6. 2. Associated T2 hyperintensity/edema in the spinal cord at C3-4. 3. Severe multilevel neural foraminal stenosis as above. 4. Prominent left facet edema at C3-4 which may be a source of neck pain.     Electronically Signed   By: Dasie Hamburg M.D.   On: 06/10/2024 11:16  I have personally reviewed the images and agree with the above interpretation.  Assessment and Plan: Ms. Madariaga is a pleasant 68 y.o. 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  She has stenosis at C4-6 but not as severe.  There is no role for conservative management for cervical myelopathy with myelomalacia.  I have recommended C3/4 and C6/7 ACDF with consideration of posterior decompression if she has continued worsening afterwards.  I discussed the planned procedure at length with the patient, including the risks, benefits, alternatives, and indications. The risks discussed include but are not limited to bleeding, infection, need for reoperation, spinal fluid leak, stroke, vision loss, anesthetic complication, coma, paralysis, and even death. We also discussed the possibility of post-operative dysphagia, vocal cord paralysis, and the risk of adjacent segment disease in the future. I also described in detail that improvement was not guaranteed.  The patient expressed understanding of these risks, and asked that we proceed with surgery. I described the surgery in layman's terms, and gave ample opportunity for questions, which were answered to the best of my ability.  Because of her medical conditions, she has significantly increased risk versus the general population because of her patient-specific medical comorbidities.  I spent a total of 30 minutes  in this patient's care today. This time was spent reviewing pertinent records including imaging studies, obtaining and confirming history, performing a directed evaluation, formulating and discussing my recommendations, and documenting the visit within the medical record.     Thank you for involving me in the care of this patient.      Marvette Schamp K. Clois MD, [facility] Neurosurgery

## 2024-07-01 ENCOUNTER — Other Ambulatory Visit: Payer: Self-pay | Admitting: Internal Medicine

## 2024-07-03 ENCOUNTER — Other Ambulatory Visit: Payer: Self-pay

## 2024-07-03 ENCOUNTER — Encounter: Payer: Self-pay | Admitting: Neurosurgery

## 2024-07-03 ENCOUNTER — Ambulatory Visit (INDEPENDENT_AMBULATORY_CARE_PROVIDER_SITE_OTHER): Admitting: Neurosurgery

## 2024-07-03 VITALS — BP 196/100 | Ht 66.0 in | Wt 160.5 lb

## 2024-07-03 DIAGNOSIS — M4802 Spinal stenosis, cervical region: Secondary | ICD-10-CM

## 2024-07-03 DIAGNOSIS — G959 Disease of spinal cord, unspecified: Secondary | ICD-10-CM

## 2024-07-03 DIAGNOSIS — M4312 Spondylolisthesis, cervical region: Secondary | ICD-10-CM

## 2024-07-03 DIAGNOSIS — G9589 Other specified diseases of spinal cord: Secondary | ICD-10-CM

## 2024-07-03 DIAGNOSIS — Z01818 Encounter for other preprocedural examination: Secondary | ICD-10-CM

## 2024-07-03 NOTE — Patient Instructions (Signed)
 Please see below for information in regards to your upcoming surgery:   Planned surgery: C3-4 & C6-7 anterior cervical discectomy and fusion   Surgery date: 07/23/24 at [facility] (Medical Mall: [address] [place], [place] [address]) - you will find out your arrival time the business day before your surgery.   Pre-op appointment at [facility] Pre-admit Testing: you will receive a call with a date/time for this appointment. If you are scheduled for an in person appointment, Pre-admit Testing is located on the first floor of the [facility] building, [address], [address]. During this appointment, they will advise you which medications you can take the morning of surgery, and which medications you will need to hold for surgery. Labs (such as blood work, EKG) may be done at your pre-op appointment. You are not required to fast for these labs. Should you need to change your pre-op appointment, please call Pre-admit testing at [phone].    Surgical clearance: we will send a clearance form to Dr Albina (PCP) and Dr Florencio (Cardiology). They may wish to see you in their office prior to signing the clearance form. If so, they may call you to schedule an appointment.     NSAIDS (Non-steroidal anti-inflammatory drugs): because you are having a fusion, please avoid taking any NSAIDS (examples: ibuprofen, motrin, aleve, naproxen, meloxicam, diclofenac) for 3 months after surgery. Celebrex  is an exception and is OK to take, if prescribed. Tylenol  is not an NSAID.    Common restrictions after surgery: No bending, lifting, or twisting ("BLT"). Avoid lifting objects heavier than 10 pounds for the first 6 weeks after surgery. Where possible, avoid household activities that involve lifting, bending, reaching, pushing, or pulling such as laundry, vacuuming, grocery shopping, and childcare. Try to arrange for help from friends and family for these activities  while you heal. Do not drive while taking prescription pain medication. Weeks 6 through 12 after surgery: avoid lifting more than 25 pounds.    X-rays after surgery: Because you are having a fusion: for appointments after your 2 week follow-up: please arrive at the [facility] outpatient imaging center ([address], [place]) or [facility] one hour prior to your appointment for x-rays. This applies to every appointment after your 2 week follow-up. Failure to do so may result in your appointment being rescheduled.   How to contact us :  If you have any questions/concerns before or after surgery, you can reach us  at [phone], or you can send a mychart message. We can be reached by phone or mychart 8am-4pm, Monday-Friday.  *Please note: Calls after 4pm are forwarded to a third party answering service. Mychart messages are not routinely monitored during evenings, weekends, and holidays. Please call our office to contact the answering service for urgent concerns during non-business hours.    If you have FMLA/disability paperwork, please drop it off or fax it to [phone]   Appointments/FMLA & disability paperwork: [employer] Registered Nurse/Surgery scheduler: Azalynn Maxim, RN Certified Medical Assistants: Don, CMA, Elenor, CMA, & Damien, CMA Physician Assistants: Lyle Decamp, PA-C, Edsel Goods, PA-C & Glade Boys, PA-C Surgeons: Penne Sharps, MD & [name], MD   [facility] PREADMIT TESTING VISIT and SURGERY INFORMATION SHEET   Now that surgery has been scheduled you can anticipate several phone calls from [facility] services. A pharmacy technician will call you to verify your current list of medications taken at home.  The Pre-Service Center will call to verify your insurance information and to give you billing estimates and information.             The Preadmit Testing Office will be  calling to schedule a visit to obtain information for the anesthesia team and provide instructions on preparation for surgery.  What can you expect for the Preadmit Testing Visit: Appointments may be scheduled in-person or by telephone.  If a telephone visit is scheduled, you may be asked to come into the office to have lab tests or other studies performed.   This visit will not be completed any greater than 14 days prior to your surgery.  If your surgery has been scheduled for a future date, please do not be alarmed if we have not contacted you to schedule an appointment more than a month prior to the surgery date.    Please be prepared to provide the following information during this appointment:            -Personal medical history                                               -Medication and allergy list            -Any history of problems with anesthesia              -Recent lab work or diagnostic studies            -Please notify us  of any needs we should be aware of to provide the best care possible           -You will be provided with instructions on how to prepare for your surgery.    On The Day of Surgery:  You must have a driver to take you home after surgery, you will be asked not to drive for 24 hours following surgery.  Taxi, Gisele and non-medical transport will not be acceptable means of transportation unless you have a responsible individual who will be traveling with you.  Visitors in the surgical area:   2 people will be able to visit you in your room once your preparation for surgery has been completed. During surgery, your visitors will be asked to wait in the Surgery Waiting Area.  It is not a requirement for them to stay, if they prefer to leave and come back.  Your visitor(s) will be given an update once the surgery has been completed.  No visitors are allowed in the initial recovery room to respect patient privacy and safety.  Once you are more awake and transfer to the  secondary recovery area, or are transferred to an inpatient room, visitors will again be able to see you.  To respect and protect your privacy: We will ask on the day of surgery who your driver will be and what the contact number for that individual will be. We will ask if it is okay to share information with this individual, or if there is an alternative individual that we, or the surgeon, should contact to provide updates and information. If family or friends come to the surgical information desk requesting information about you, who you have not listed with us , no information will be given.   It may be helpful to designate someone as the main contact who will be responsible for updating your other friends  and family.    PREADMIT TESTING OFFICE: [phone] SAME DAY SURGERY: [phone] We look forward to caring for you before and throughout the process of your surgery.

## 2024-07-04 ENCOUNTER — Telehealth: Payer: Self-pay | Admitting: Neurosurgery

## 2024-07-04 NOTE — Telephone Encounter (Signed)
 Patient called stating sh eis having an upcoming surgery on August 11th- she wanted to let the nurse know that she will need home health aide after surgery as she will not have anyone to help her like she thought she would.

## 2024-07-05 NOTE — Telephone Encounter (Signed)
 Left message to return call

## 2024-07-05 NOTE — Telephone Encounter (Signed)
 I spoke with the patient and explained that physical therapy will evaluate her after surgery and prior to discharge they will determine if she needs home health assistance versus if she will need to stay at a skilled nursing facility temporarily. Therefore, we will wait until after surgery to determine what she needs.

## 2024-07-06 ENCOUNTER — Other Ambulatory Visit: Payer: Self-pay | Admitting: Surgery

## 2024-07-06 ENCOUNTER — Other Ambulatory Visit: Payer: Self-pay | Admitting: Internal Medicine

## 2024-07-09 ENCOUNTER — Other Ambulatory Visit: Payer: Self-pay | Admitting: Internal Medicine

## 2024-07-09 DIAGNOSIS — Z8781 Personal history of (healed) traumatic fracture: Secondary | ICD-10-CM | POA: Diagnosis not present

## 2024-07-09 DIAGNOSIS — G4733 Obstructive sleep apnea (adult) (pediatric): Secondary | ICD-10-CM | POA: Diagnosis not present

## 2024-07-09 DIAGNOSIS — S82832A Other fracture of upper and lower end of left fibula, initial encounter for closed fracture: Secondary | ICD-10-CM | POA: Diagnosis not present

## 2024-07-09 DIAGNOSIS — R2689 Other abnormalities of gait and mobility: Secondary | ICD-10-CM | POA: Diagnosis not present

## 2024-07-10 ENCOUNTER — Encounter: Payer: Self-pay | Admitting: Urgent Care

## 2024-07-10 ENCOUNTER — Ambulatory Visit (INDEPENDENT_AMBULATORY_CARE_PROVIDER_SITE_OTHER): Admitting: Internal Medicine

## 2024-07-10 VITALS — BP 170/86 | HR 76 | Temp 98.6°F | Ht 66.0 in | Wt 167.6 lb

## 2024-07-10 DIAGNOSIS — E139 Other specified diabetes mellitus without complications: Secondary | ICD-10-CM

## 2024-07-10 DIAGNOSIS — I10 Essential (primary) hypertension: Secondary | ICD-10-CM | POA: Diagnosis not present

## 2024-07-10 DIAGNOSIS — E782 Mixed hyperlipidemia: Secondary | ICD-10-CM | POA: Diagnosis not present

## 2024-07-10 DIAGNOSIS — Z0001 Encounter for general adult medical examination with abnormal findings: Secondary | ICD-10-CM

## 2024-07-10 DIAGNOSIS — E1142 Type 2 diabetes mellitus with diabetic polyneuropathy: Secondary | ICD-10-CM

## 2024-07-10 LAB — POCT CBG (FASTING - GLUCOSE)-MANUAL ENTRY: Glucose Fasting, POC: 113 mg/dL — AB (ref 70–99)

## 2024-07-10 MED ORDER — CLONIDINE HCL 0.2 MG PO TABS
0.2000 mg | ORAL_TABLET | Freq: Two times a day (BID) | ORAL | 0 refills | Status: DC
Start: 2024-07-10 — End: 2024-10-05

## 2024-07-10 MED ORDER — MINOXIDIL 10 MG PO TABS: 10.0000 mg | ORAL_TABLET | Freq: Every day | ORAL | 2 refills | Status: DC

## 2024-07-10 NOTE — Progress Notes (Signed)
 Established Patient Office Visit  Subjective:  Patient ID: [name], female    DOB: 1956-07-30  Age: 68 y.o. MRN: [id number]  Chief Complaint  Patient presents with   Follow-up   Annual Exam    Annual wellness visit    No new complaints, here for AWV refer to quality metrics and scanned documents.   BP still elevated even on recheck.    No other concerns at this time.   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)    Community acquired pneumonia 01/10/2013   COPD (chronic obstructive pulmonary disease) (HCC)    no inhalers--smoker, no oxygen    Depression    Dyspnea    Emphysema lung (HCC)    Failed back surgical syndrome    GERD (gastroesophageal reflux disease)    Hyperlipidemia    Hypertension    Hypothyroid    Lumbar spondylosis    Nicotine  dependence    Normocytic anemia    OSA on CPAP    Peripheral vascular disease (HCC)    Pre-diabetes    Recurrent UTI (urinary tract infection)    Restless leg syndrome    Seizures (HCC)    02/13/2011 -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  no seizure since--pt does take keppra  and office note from neurologist dr. margaret on this chart   Sepsis due to pneumonia [facility])    Sinus tachycardia    Urinary incontinence    Wound of left leg    being seen at wound clinic as of 03-23-24    Past Surgical History:  Procedure Laterality Date   CARPAL TUNNEL RELEASE     left   COLONOSCOPY WITH PROPOFOL  N/A 06/17/2022   Procedure:  COLONOSCOPY WITH PROPOFOL ;  Surgeon: Maryruth Ole DASEN, MD;  Location: [facility] ENDOSCOPY;  Service: Endoscopy;  Laterality: N/A;   JOINT REPLACEMENT  08/26/2011   right total knee arthroplasty, left total knee   KNEE ARTHROPLASTY  10/28/2011   Procedure: COMPUTER ASSISTED TOTAL KNEE ARTHROPLASTY;  Surgeon: Norleen CROME Rendall III;  Location: [facility] ORS;  Service: Orthopedics;  Laterality: Left;  preop femoral nerve block   KNEE SURGERY     right   laminectomy and diskectomy     L4-5 with fusion   OPEN REDUCTION INTERNAL FIXATION (ORIF) METACARPAL Left 01/30/2024   Procedure: OPEN REDUCTION INTERNAL FIXATION (ORIF) OF LEFT 5TH METACARPAL SHAFT FRACTURE.;  Surgeon: Edie Norleen PARAS, MD;  Location: [facility] ORS;  Service: Orthopedics;  Laterality: Left;   PROCTOSCOPY N/A 06/18/2022   Procedure: RIGID PROCTOSCOPY;  Surgeon: Sheldon Standing, MD;  Location: [facility] ORS;  Service: General;  Laterality: N/A;    Social History   Socioeconomic History   Marital status: Widowed    Spouse name: Not on file   Number of children: Not on file   Years of education: Not on file   Highest education level: Not on file  Occupational History   Not on file  Tobacco Use  Smoking status: Every Day    Current packs/day: 2.00    Average packs/day: 2.0 packs/day for 50.0 years (100.0 ttl pk-yrs)    Types: Cigarettes    Passive exposure: Current   Smokeless tobacco: Never  Vaping Use   Vaping status: Every Day   Substances: Nicotine , Flavoring  Substance and Sexual Activity   Alcohol  use: Yes    Comment: occasional   Drug use: No   Sexual activity: Yes    Birth control/protection: Post-menopausal  Other Topics Concern   Not on file  Social History Narrative   ** Merged History Encounter **       Social Drivers of Health   Financial Resource Strain: Low Risk  (03/09/2024)   Received from [facility]   Overall Financial Resource Strain (CARDIA)    Difficulty of Paying Living Expenses: Not hard at all   Food Insecurity: No Food Insecurity (04/02/2024)   Hunger Vital Sign    Worried About Running Out of Food in the Last Year: Never true    Ran Out of Food in the Last Year: Never true  Transportation Needs: No Transportation Needs (04/02/2024)   PRAPARE - Administrator, Civil Service (Medical): No    Lack of Transportation (Non-Medical): No  Recent Concern: Transportation Needs - Unmet Transportation Needs (01/25/2024)   Received from [facility] - Transportation    In the past 12 months, has lack of transportation kept you from medical appointments or from getting medications?: Yes    Lack of Transportation (Non-Medical): Yes  Physical Activity: Not on file  Stress: Not on file  Social Connections: Socially Isolated (03/28/2024)   Social Connection and Isolation Panel    Frequency of Communication with Friends and Family: More than three times a week    Frequency of Social Gatherings with Friends and Family: More than three times a week    Attends Religious Services: Never    Database administrator or Organizations: No    Attends Banker Meetings: Never    Marital Status: Widowed  Intimate Partner Violence: Not At Risk (04/02/2024)   Humiliation, Afraid, Rape, and Kick questionnaire    Fear of Current or Ex-Partner: No    Emotionally Abused: No    Physically Abused: No    Sexually Abused: No    Family History  Problem Relation Age of Onset   Heart attack Father 27   Alzheimer'Mavryk Pino disease Mother    Breast cancer Neg Hx     Allergies  Allergen Reactions   Nsaids Other (See Comments)    Pt states it messes up her kidneys But takes aspirin  & ibuprofen   Thiazide-Type Diuretics     Other reaction(Stuart Guillen): Unknown    Outpatient Medications Prior to Visit  Medication Sig   acetaminophen  (TYLENOL ) 650 MG CR tablet Take 1,950 mg by mouth every 8 (eight) hours as needed for pain.   albuterol  (VENTOLIN  HFA) 108 (90 Base) MCG/ACT  inhaler Inhale 2 puffs into the lungs every 4 (four) hours as needed for wheezing or shortness of breath.   Azelastine  HCl 137 MCG/SPRAY SOLN Place 1 spray into the nose daily as needed (Congestion/ allergies).   budesonide -glycopyrrolate -formoterol (BREZTRI) 160-9-4.8 MCG/ACT AERO inhaler Inhale 2 puffs into the lungs in the morning and at bedtime.   busPIRone  (BUSPAR ) 30 MG tablet TAKE 1 TABLET BY MOUTH 2 TIMES DAILY.   Cholecalciferol  (VITAMIN D -3) 25 MCG (1000 UT) CAPS Take 1,000 Units  by mouth daily.   ciclopirox (PENLAC) 8 % solution Apply 1 application  topically at bedtime.   cyanocobalamin  (VITAMIN B12) 1000 MCG tablet TAKE 1 TABLET BY MOUTH EVERY DAY *NEW PRESCRIPTION REQUEST*   diltiazem  (CARDIZEM  CD) 180 MG 24 hr capsule Take 180 mg by mouth daily.   escitalopram  (LEXAPRO ) 20 MG tablet Take 1 tablet (20 mg total) by mouth every morning.   fenofibrate  micronized (LOFIBRA) 134 MG capsule TAKE 1 CAPSULE BY MOUTH DAILY BEFORE BREAKFAST.   fluticasone  (FLONASE ) 50 MCG/ACT nasal spray Place 1 spray into both nostrils daily as needed for allergies.   gabapentin  (NEURONTIN ) 800 MG tablet Take 1 tablet (800 mg total) by mouth 3 (three) times daily.   hydrALAZINE  (APRESOLINE ) 10 MG tablet Take 1 tablet (10 mg total) by mouth 3 (three) times daily as needed. Please take if your blood pressure is above 150/90. You can repeat the dose as needed up with up to 3 pills per day. If your blood pressure is not improving with this, please call your doctor'Lige Lakeman office.   levothyroxine  (SYNTHROID ) 25 MCG tablet TAKE 1 TABLET BY MOUTH EVERY DAY *NEW PRESCRIPTION REQUEST*   naphazoline-glycerin (CLEAR EYES REDNESS) 0.012-0.25 % SOLN Place 1-2 drops into both eyes 4 (four) times daily as needed for eye irritation.   olmesartan  (BENICAR ) 40 MG tablet Take 40 mg by mouth every morning.   OXYGEN  Inhale 3 L into the lungs at bedtime as needed (shortness of breath).   pantoprazole  (PROTONIX ) 40 MG tablet TAKE 1 TABLET  BY MOUTH EVERY DAY IN THE MORNING   pramipexole  (MIRAPEX ) 0.25 MG tablet Take 1 tablet (0.25 mg total) by mouth 3 (three) times daily.   predniSONE  (DELTASONE ) 5 MG tablet Take 5-30 mg by mouth See admin instructions. Take 6 tablets (30 mg total) by mouth once daily for 3 days, THEN 4 tablets (20 mg total) once daily for 3 days, THEN 2 tablets (10 mg total) once daily for 2 days, THEN 1 tablet (5 mg total) once daily for 2 days.    Instructions: Take 6 tablets (30 mg total) by mouth once daily for 3 days, THEN 4 tablets (20 mg total) once daily for 3 days, THEN 2 tablets (10 mg total) once daily for 2 days, THEN 1 tablet (5 mg total) once daily for 2 days.   pyridOXINE (VITAMIN B6) 100 MG tablet TAKE 1 TABLET BY MOUTH EVERY DAY *NEW PRESCRIPTION REQUEST*   [DISCONTINUED] cloNIDine  (CATAPRES ) 0.2 MG tablet TAKE 1 TABLET BY MOUTH TWICE A DAY   [DISCONTINUED] minoxidil  (LONITEN ) 2.5 MG tablet Take 3 tablets (7.5 mg total) by mouth daily.   Multiple Vitamins-Minerals (MULTIVITAMIN WITH MINERALS) tablet Take 1 tablet by mouth daily. 50 + woman (Patient not taking: Reported on 07/10/2024)   nicotine  (NICODERM CQ  - DOSED IN MG/24 HOURS) 21 mg/24hr patch Place 21 mg onto the skin daily as needed (nicotine  dependence). (Patient not taking: Reported on 07/10/2024)   NON FORMULARY Pt uses a c-pap nightly w/ oxygen  (Patient not taking: Reported on 07/10/2024)   Facility-Administered Medications Prior to Visit  Medication Dose Route Frequency Provider   chlorhexidine  (HIBICLENS ) 4 % liquid 4 application  60 mL Topical Once Duffy, Karin, PA-C   chlorhexidine  (HIBICLENS ) 4 % liquid 4 application  60 mL Topical Once Duffy, Wonda, PA-C    Review of Systems  Constitutional: Negative.   HENT: Negative.    Eyes: Negative.   Respiratory:  Positive for shortness of breath (on exertion).   Cardiovascular:  Negative.   Gastrointestinal:  Positive for constipation.  Genitourinary: Negative.   Musculoskeletal:  Positive  for back pain and falls.  Skin: Negative.   Neurological:  Positive for sensory change and headaches (chronic).  Endo/Heme/Allergies: Negative.   Psychiatric/Behavioral: Negative.         Objective:   BP (!) 170/86   Pulse 76   Temp 98.6 F (37 C)   Ht 5' 6 (1.676 m)   Wt 167 lb 9.6 oz (76 kg)   SpO2 92%   BMI 27.05 kg/m   Vitals:   07/10/24 1408  BP: (!) 170/86  Pulse: 76  Temp: 98.6 F (37 C)  Height: 5' 6 (1.676 m)  Weight: 167 lb 9.6 oz (76 kg)  SpO2: 92%  BMI (Calculated): 27.06    Physical Exam Vitals reviewed.  Constitutional:      General: She is not in acute distress.    Comments:    HENT:     Head: Normocephalic.     Nose: Nose normal.     Mouth/Throat:     Mouth: Mucous membranes are moist.  Eyes:     Extraocular Movements: Extraocular movements intact.     Pupils: Pupils are equal, round, and reactive to light.  Cardiovascular:     Rate and Rhythm: Normal rate and regular rhythm.     Heart sounds: No murmur heard. Pulmonary:     Effort: Pulmonary effort is normal.     Breath sounds: Decreased air movement present. No rhonchi or rales.  Abdominal:     General: Abdomen is flat.     Palpations: There is no hepatomegaly, splenomegaly or mass.     Tenderness: There is no guarding or rebound.  Musculoskeletal:        General: Normal range of motion.     Right hand: Swelling and deformity present.     Cervical back: Normal range of motion. No tenderness.  Skin:    General: Skin is warm and dry.     Comments: Left leg wound dressed  Neurological:     General: No focal deficit present.     Mental Status: She is alert and oriented to person, place, and time.     Cranial Nerves: No cranial nerve deficit.     Motor: No weakness.  Psychiatric:        Mood and Affect: Mood normal.        Behavior: Behavior normal.      Results for orders placed or performed in visit on 07/10/24  POCT CBG (Fasting - Glucose)  Result Value Ref Range    Glucose Fasting, POC 113 (A) 70 - 99 mg/dL    Recent Results (from the past 2160 hours)  VAS US  ABI WITH/WO TBI     Status: None   Collection Time: 05/02/24  1:26 PM  Result Value Ref Range   Right ABI 1.09    Left ABI 1.00   POCT CBG (Fasting - Glucose)     Status: Abnormal   Collection Time: 07/10/24  2:36 PM  Result Value Ref Range   Glucose Fasting, POC 113 (A) 70 - 99 mg/dL      Assessment & Plan:  As per problem list. Increase Minoxidil . Problem List Items Addressed This Visit       Cardiovascular and Mediastinum   Hypertension   Relevant Medications   minoxidil  (LONITEN ) 10 MG tablet   cloNIDine  (CATAPRES ) 0.2 MG tablet     Endocrine   Type 2 diabetes  mellitus with peripheral neuropathy (HCC) - Primary     Other   Mixed hyperlipidemia   Relevant Medications   minoxidil  (LONITEN ) 10 MG tablet   cloNIDine  (CATAPRES ) 0.2 MG tablet   Other Visit Diagnoses       Diabetes 1.5, managed as type 2 (HCC)       Relevant Orders   POCT CBG (Fasting - Glucose) (Completed)       Return in 3 weeks (on 07/31/2024) for BP followup.   Total time spent: 30 minutes  Sherrill Cinderella Perry, MD  07/10/2024   This document may have been prepared by [facility] Voice Recognition software and as such may include unintentional dictation errors.

## 2024-07-11 ENCOUNTER — Encounter: Admission: RE | Admit: 2024-07-11 | Source: Ambulatory Visit

## 2024-07-11 HISTORY — DX: Other specified postprocedural states: Z98.890

## 2024-07-11 LAB — LIPID PANEL
Chol/HDL Ratio: 2.1 ratio (ref 0.0–4.4)
Cholesterol, Total: 171 mg/dL (ref 100–199)
HDL: 82 mg/dL (ref >39)
LDL Chol Calc (NIH): 73 mg/dL (ref 0–99)
Triglycerides: 88 mg/dL (ref 0–149)
VLDL Cholesterol Cal: 16 mg/dL (ref 5–40)

## 2024-07-11 LAB — COMPREHENSIVE METABOLIC PANEL WITH GFR
ALT: 19 IU/L (ref 0–32)
AST: 20 IU/L (ref 0–40)
Albumin: 4.2 g/dL (ref 3.9–4.9)
Alkaline Phosphatase: 20 IU/L — ABNORMAL LOW (ref 44–121)
BUN/Creatinine Ratio: 18 (ref 12–28)
BUN: 18 mg/dL (ref 8–27)
Bilirubin Total: 0.3 mg/dL (ref 0.0–1.2)
CO2: 21 mmol/L (ref 20–29)
Calcium: 9.8 mg/dL (ref 8.7–10.3)
Chloride: 105 mmol/L (ref 96–106)
Creatinine, Ser: 0.98 mg/dL (ref 0.57–1.00)
Globulin, Total: 2.4 g/dL (ref 1.5–4.5)
Glucose: 101 mg/dL — ABNORMAL HIGH (ref 70–99)
Potassium: 4.3 mmol/L (ref 3.5–5.2)
Sodium: 142 mmol/L (ref 134–144)
Total Protein: 6.6 g/dL (ref 6.0–8.5)
eGFR: 63 mL/min/1.73 (ref >59)

## 2024-07-12 ENCOUNTER — Encounter
Admission: RE | Admit: 2024-07-12 | Discharge: 2024-07-12 | Disposition: A | Discharge status: Home / Self Care | Source: Ambulatory Visit | Attending: Neurosurgery | Admitting: Neurosurgery

## 2024-07-12 ENCOUNTER — Other Ambulatory Visit: Payer: Self-pay

## 2024-07-12 VITALS — BP 154/76 | HR 71 | Resp 18 | Ht 66.0 in | Wt 170.8 lb

## 2024-07-12 DIAGNOSIS — Z01812 Encounter for preprocedural laboratory examination: Secondary | ICD-10-CM | POA: Diagnosis present

## 2024-07-12 DIAGNOSIS — I5033 Acute on chronic diastolic (congestive) heart failure: Secondary | ICD-10-CM | POA: Insufficient documentation

## 2024-07-12 DIAGNOSIS — I10 Essential (primary) hypertension: Secondary | ICD-10-CM

## 2024-07-12 DIAGNOSIS — Z01818 Encounter for other preprocedural examination: Secondary | ICD-10-CM | POA: Insufficient documentation

## 2024-07-12 DIAGNOSIS — J441 Chronic obstructive pulmonary disease with (acute) exacerbation: Secondary | ICD-10-CM | POA: Diagnosis not present

## 2024-07-12 DIAGNOSIS — I110 Hypertensive heart disease with heart failure: Secondary | ICD-10-CM | POA: Insufficient documentation

## 2024-07-12 DIAGNOSIS — Z01810 Encounter for preprocedural cardiovascular examination: Secondary | ICD-10-CM | POA: Diagnosis present

## 2024-07-12 DIAGNOSIS — E1142 Type 2 diabetes mellitus with diabetic polyneuropathy: Secondary | ICD-10-CM | POA: Diagnosis not present

## 2024-07-12 DIAGNOSIS — I272 Pulmonary hypertension, unspecified: Secondary | ICD-10-CM

## 2024-07-12 HISTORY — DX: Atherosclerosis of aorta: I70.0

## 2024-07-12 HISTORY — DX: Type 2 diabetes mellitus without complications: E11.9

## 2024-07-12 HISTORY — DX: Polyneuropathy, unspecified: G62.9

## 2024-07-12 HISTORY — DX: Spinal stenosis, cervical region: M48.02

## 2024-07-12 HISTORY — DX: Dependence on supplemental oxygen: Z99.81

## 2024-07-12 HISTORY — DX: Disease of spinal cord, unspecified: G95.9

## 2024-07-12 HISTORY — DX: Atherosclerotic heart disease of native coronary artery without angina pectoris: I25.10

## 2024-07-12 LAB — TYPE AND SCREEN
ABO/RH(D): A POS
Antibody Screen: POSITIVE

## 2024-07-12 LAB — CBC
HCT: 35.6 % — ABNORMAL LOW (ref 36.0–46.0)
Hemoglobin: 11.6 g/dL — ABNORMAL LOW (ref 12.0–15.0)
MCH: 32.6 pg (ref 26.0–34.0)
MCHC: 32.6 g/dL (ref 30.0–36.0)
MCV: 100 fL (ref 80.0–100.0)
Platelets: 224 K/uL (ref 150–400)
RBC: 3.56 MIL/uL — ABNORMAL LOW (ref 3.87–5.11)
RDW: 13.7 % (ref 11.5–15.5)
WBC: 9.1 K/uL (ref 4.0–10.5)
nRBC: 0 % (ref 0.0–0.2)

## 2024-07-12 LAB — SURGICAL PCR SCREEN
MRSA, PCR: NEGATIVE
Staphylococcus aureus: NEGATIVE

## 2024-07-12 NOTE — Patient Instructions (Addendum)
 Your procedure is scheduled on:07-23-24 Monday Report to the Registration Desk on the 1st floor of the [facility].Then proceed to the 2nd floor Surgery Desk To find out your arrival time, please call [phone] between 1PM - 3PM on:07-20-24 Friday If your arrival time is 6:00 am, do not arrive before that time as the [facility] Mall entrance doors do not open until 6:00 am.  REMEMBER: Instructions that are not followed completely may result in serious medical risk, up to and including death; or upon the discretion of your surgeon and anesthesiologist your surgery may need to be rescheduled.  Do not eat food after midnight the night before surgery.  No gum chewing or hard candies.  You may however, drink Water up to 2 hours before you are scheduled to arrive for your surgery. Do not drink anything within 2 hours of your scheduled arrival time.  One week prior to surgery: Stop ANY OVER THE COUNTER supplements until after surgery (Vitamin D , B12, B6, Multivitamin)  Continue taking all of your other prescription medications up until the day of surgery.  ON THE DAY OF SURGERY ONLY TAKE THESE MEDICATIONS WITH SIPS OF WATER: -busPIRone  (BUSPAR )  -cloNIDine  (CATAPRES )  -diltiazem  (CARDIZEM  CD)  -escitalopram  (LEXAPRO )  -gabapentin  (NEURONTIN )  -hydrALAZINE  (APRESOLINE )  -levothyroxine  (SYNTHROID )  -pantoprazole  (PROTONIX )  -pramipexole  (MIRAPEX )   Use your Albuterol  and Breztri Inhaler the day of surgery and bring your Albuterol  Inhaler to the hospital  No Alcohol  for 24 hours before or after surgery.  No Smoking including e-cigarettes for 24 hours before surgery.  No chewable tobacco products for at least 6 hours before surgery.  No nicotine  patches on the day of surgery.  Do not use any recreational drugs for at least a week (preferably 2 weeks) before your surgery.  Please be advised that the combination of cocaine and anesthesia may have negative outcomes, up to and including  death. If you test positive for cocaine, your surgery will be cancelled.  On the morning of surgery brush your teeth with toothpaste and water, you may rinse your mouth with mouthwash if you wish. Do not swallow any toothpaste or mouthwash.  Use CHG Soap as directed on instruction sheet.  Do not wear jewelry, make-up, hairpins, clips or nail polish.  For welded (permanent) jewelry: bracelets, anklets, waist bands, etc.  Please have this removed prior to surgery.  If it is not removed, there is a chance that hospital personnel will need to cut it off on the day of surgery.  Do not wear lotions, powders, or perfumes.   Do not shave body hair from the neck down 48 hours before surgery.  Contact lenses, hearing aids and dentures may not be worn into surgery.  Do not bring valuables to the hospital. [facility] is not responsible for any missing/lost belongings or valuables.   Bring your C-PAP to the hospital  Notify your doctor if there is any change in your medical condition (cold, fever, infection).  Wear comfortable clothing (specific to your surgery type) to the hospital.  After surgery, you can help prevent lung complications by doing breathing exercises.  Take deep breaths and cough every 1-2 hours. Your doctor may order a device called an Incentive Spirometer to help you take deep breaths. When coughing or sneezing, hold a pillow firmly against your incision with both hands. This is called "splinting." Doing this helps protect your incision. It also decreases belly discomfort.  If you are being admitted to the hospital overnight, leave your  suitcase in the car. After surgery it may be brought to your room.  In case of increased patient census, it may be necessary for you, the patient, to continue your postoperative care in the Same Day Surgery department.  If you are being discharged the day of surgery, you will not be allowed to drive home. You will need a responsible  individual to drive you home and stay with you for 24 hours after surgery.   If you are taking public transportation, you will need to have a responsible individual with you.  Please call the Pre-admissions Testing Dept. at [phone] if you have any questions about these instructions.  Surgery Visitation Policy:  Patients having surgery or a procedure may have two visitors.  Children under the age of 26 must have an adult with them who is not the patient.  Inpatient Visitation:    Visiting hours are 7 a.m. to 8 p.m. Up to four visitors are allowed at one time in a patient room. The visitors may rotate out with other people during the day.  One visitor age 19 or older may stay with the patient overnight and must be in the room by 8 p.m.    Pre-operative 5 CHG Bath Instructions   You can play a key role in reducing the risk of infection after surgery. Your skin needs to be as free of germs as possible. You can reduce the number of germs on your skin by washing with CHG (chlorhexidine  gluconate) soap before surgery. CHG is an antiseptic soap that kills germs and continues to kill germs even after washing.   DO NOT use if you have an allergy to chlorhexidine /CHG or antibacterial soaps. If your skin becomes reddened or irritated, stop using the CHG and notify one of our RNs at [phone].   Please shower with the CHG soap starting 4 days before surgery using the following schedule:     Please keep in mind the following:  DO NOT shave, including legs and underarms, starting the day of your first shower.   You may shave your face at any point before/day of surgery.  Place clean sheets on your bed the day you start using CHG soap. Use a clean washcloth (not used since being washed) for each shower. DO NOT sleep with pets once you start using the CHG.   CHG Shower Instructions:  If you choose to wash your hair and private area, wash first with your normal shampoo/soap.  After you  use shampoo/soap, rinse your hair and body thoroughly to remove shampoo/soap residue.  Turn the water OFF and apply about 3 tablespoons (45 ml) of CHG soap to a CLEAN washcloth.  Apply CHG soap ONLY FROM YOUR NECK DOWN TO YOUR TOES (washing for 3-5 minutes)  DO NOT use CHG soap on face, private areas, open wounds, or sores.  Pay special attention to the area where your surgery is being performed.  If you are having back surgery, having someone wash your back for you may be helpful. Wait 2 minutes after CHG soap is applied, then you may rinse off the CHG soap.  Pat dry with a clean towel  Put on clean clothes/pajamas   If you choose to wear lotion, please use ONLY the CHG-compatible lotions on the back of this paper.     Additional instructions for the day of surgery: DO NOT APPLY any lotions, deodorants, cologne, or perfumes.   Put on clean/comfortable clothes.  Brush your teeth.  Ask your  nurse before applying any prescription medications to the skin.      CHG Compatible Lotions   Aveeno Moisturizing lotion  Cetaphil Moisturizing Cream  Cetaphil Moisturizing Lotion  Clairol Herbal Essence Moisturizing Lotion, Dry Skin  Clairol Herbal Essence Moisturizing Lotion, Extra Dry Skin  Clairol Herbal Essence Moisturizing Lotion, Normal Skin  Curel Age Defying Therapeutic Moisturizing Lotion with Alpha Hydroxy  Curel Extreme Care Body Lotion  Curel Soothing Hands Moisturizing Hand Lotion  Curel Therapeutic Moisturizing Cream, Fragrance-Free  Curel Therapeutic Moisturizing Lotion, Fragrance-Free  Curel Therapeutic Moisturizing Lotion, Original Formula  Eucerin Daily Replenishing Lotion  Eucerin Dry Skin Therapy Plus Alpha Hydroxy Crme  Eucerin Dry Skin Therapy Plus Alpha Hydroxy Lotion  Eucerin Original Crme  Eucerin Original Lotion  Eucerin Plus Crme Eucerin Plus Lotion  Eucerin TriLipid Replenishing Lotion  Keri Anti-Bacterial Hand Lotion  Keri Deep Conditioning Original Lotion  Dry Skin Formula Softly Scented  Keri Deep Conditioning Original Lotion, Fragrance Free Sensitive Skin Formula  Keri Lotion Fast Absorbing Fragrance Free Sensitive Skin Formula  Keri Lotion Fast Absorbing Softly Scented Dry Skin Formula  Keri Original Lotion  Keri Skin Renewal Lotion Keri Silky Smooth Lotion  Keri Silky Smooth Sensitive Skin Lotion  Nivea Body Creamy Conditioning Oil  Nivea Body Extra Enriched Teacher, adult education Moisturizing Lotion Nivea Crme  Nivea Skin Firming Lotion  NutraDerm 30 Skin Lotion  NutraDerm Skin Lotion  NutraDerm Therapeutic Skin Cream  NutraDerm Therapeutic Skin Lotion  ProShield Protective Hand Cream  Provon moisturizing lotion  Merchandiser, retail to address health-related social needs:  [email or web address]

## 2024-07-12 NOTE — Patient Instructions (Signed)
 Your procedure is scheduled on:07-23-24 Monday Report to the Registration Desk on the 1st floor of the [facility].Then proceed to the 2nd floor Surgery Desk To find out your arrival time, please call [phone] between 1PM - 3PM on:07-20-24 Friday If your arrival time is 6:00 am, do not arrive before that time as the [facility] entrance doors do not open until 6:00 am.  REMEMBER: Instructions that are not followed completely may result in serious medical risk, up to and including death; or upon the discretion of your surgeon and anesthesiologist your surgery may need to be rescheduled.  Do not eat food after midnight the night before surgery.  No gum chewing or hard candies.  You may however, drink Water up to 2 hours before you are scheduled to arrive for your surgery. Do not drink anything within 2 hours of your scheduled arrival time.  One week prior to surgery: Last dose will be on 07-15-24 Stop ANY OVER THE COUNTER supplements until after surgery (Vitamin D , Multivitamin)  Continue taking all of your other prescription medications up until the day of surgery.  ON THE DAY OF SURGERY ONLY TAKE THESE MEDICATIONS WITH SIPS OF WATER: -busPIRone  (BUSPAR )  -cloNIDine  (CATAPRES )  -diltiazem  (CARDIZEM  CD)  -escitalopram  (LEXAPRO )  -gabapentin  (NEURONTIN )  -levothyroxine  (SYNTHROID )  -pantoprazole  (PROTONIX )  -pramipexole  (MIRAPEX )   Use your Albuterol  and Breztri Inhaler the day of surgery and bring your Albuterol  Inhaler to the hospital  No Alcohol  for 24 hours before or after surgery.  No Smoking including e-cigarettes for 24 hours before surgery.  No chewable tobacco products for at least 6 hours before surgery.  No nicotine  patches on the day of surgery.  Do not use any recreational drugs for at least a week (preferably 2 weeks) before your surgery.  Please be advised that the combination of cocaine and anesthesia may have negative outcomes, up to and including death. If  you test positive for cocaine, your surgery will be cancelled.  On the morning of surgery brush your teeth with toothpaste and water, you may rinse your mouth with mouthwash if you wish. Do not swallow any toothpaste or mouthwash.  Use CHG Soap as directed on instruction sheet.  Do not wear jewelry, make-up, hairpins, clips or nail polish.  For welded (permanent) jewelry: bracelets, anklets, waist bands, etc.  Please have this removed prior to surgery.  If it is not removed, there is a chance that hospital personnel will need to cut it off on the day of surgery.  Do not wear lotions, powders, or perfumes.   Do not shave body hair from the neck down 48 hours before surgery.  Contact lenses, hearing aids and dentures may not be worn into surgery.  Do not bring valuables to the hospital. [facility] is not responsible for any missing/lost belongings or valuables.   Bring your C-PAP to the hospital   Notify your doctor if there is any change in your medical condition (cold, fever, infection).  Wear comfortable clothing (specific to your surgery type) to the hospital.  After surgery, you can help prevent lung complications by doing breathing exercises.  Take deep breaths and cough every 1-2 hours. Your doctor may order a device called an Incentive Spirometer to help you take deep breaths. When coughing or sneezing, hold a pillow firmly against your incision with both hands. This is called "splinting." Doing this helps protect your incision. It also decreases belly discomfort.  If you are being admitted to the hospital overnight,  leave your suitcase in the car. After surgery it may be brought to your room.  In case of increased patient census, it may be necessary for you, the patient, to continue your postoperative care in the Same Day Surgery department.  If you are being discharged the day of surgery, you will not be allowed to drive home. You will need a responsible individual to drive  you home and stay with you for 24 hours after surgery.   If you are taking public transportation, you will need to have a responsible individual with you.  Please call the Pre-admissions Testing Dept. at [phone] if you have any questions about these instructions.  Surgery Visitation Policy:  Patients having surgery or a procedure may have two visitors.  Children under the age of 33 must have an adult with them who is not the patient.  Inpatient Visitation:    Visiting hours are 7 a.m. to 8 p.m. Up to four visitors are allowed at one time in a patient room. The visitors may rotate out with other people during the day.  One visitor age 38 or older may stay with the patient overnight and must be in the room by 8 p.m.    Pre-operative 5 CHG Bath Instructions   You can play a key role in reducing the risk of infection after surgery. Your skin needs to be as free of germs as possible. You can reduce the number of germs on your skin by washing with CHG (chlorhexidine  gluconate) soap before surgery. CHG is an antiseptic soap that kills germs and continues to kill germs even after washing.   DO NOT use if you have an allergy to chlorhexidine /CHG or antibacterial soaps. If your skin becomes reddened or irritated, stop using the CHG and notify one of our RNs at [phone].   Please shower with the CHG soap starting 4 days before surgery using the following schedule:     Please keep in mind the following:  DO NOT shave, including legs and underarms, starting the day of your first shower.   You may shave your face at any point before/day of surgery.  Place clean sheets on your bed the day you start using CHG soap. Use a clean washcloth (not used since being washed) for each shower. DO NOT sleep with pets once you start using the CHG.   CHG Shower Instructions:  If you choose to wash your hair and private area, wash first with your normal shampoo/soap.  After you use shampoo/soap,  rinse your hair and body thoroughly to remove shampoo/soap residue.  Turn the water OFF and apply about 3 tablespoons (45 ml) of CHG soap to a CLEAN washcloth.  Apply CHG soap ONLY FROM YOUR NECK DOWN TO YOUR TOES (washing for 3-5 minutes)  DO NOT use CHG soap on face, private areas, open wounds, or sores.  Pay special attention to the area where your surgery is being performed.  If you are having back surgery, having someone wash your back for you may be helpful. Wait 2 minutes after CHG soap is applied, then you may rinse off the CHG soap.  Pat dry with a clean towel  Put on clean clothes/pajamas   If you choose to wear lotion, please use ONLY the CHG-compatible lotions on the back of this paper.     Additional instructions for the day of surgery: DO NOT APPLY any lotions, deodorants, cologne, or perfumes.   Put on clean/comfortable clothes.  Brush your teeth.  Ask your nurse before applying any prescription medications to the skin.      CHG Compatible Lotions   Aveeno Moisturizing lotion  Cetaphil Moisturizing Cream  Cetaphil Moisturizing Lotion  Clairol Herbal Essence Moisturizing Lotion, Dry Skin  Clairol Herbal Essence Moisturizing Lotion, Extra Dry Skin  Clairol Herbal Essence Moisturizing Lotion, Normal Skin  Curel Age Defying Therapeutic Moisturizing Lotion with Alpha Hydroxy  Curel Extreme Care Body Lotion  Curel Soothing Hands Moisturizing Hand Lotion  Curel Therapeutic Moisturizing Cream, Fragrance-Free  Curel Therapeutic Moisturizing Lotion, Fragrance-Free  Curel Therapeutic Moisturizing Lotion, Original Formula  Eucerin Daily Replenishing Lotion  Eucerin Dry Skin Therapy Plus Alpha Hydroxy Crme  Eucerin Dry Skin Therapy Plus Alpha Hydroxy Lotion  Eucerin Original Crme  Eucerin Original Lotion  Eucerin Plus Crme Eucerin Plus Lotion  Eucerin TriLipid Replenishing Lotion  Keri Anti-Bacterial Hand Lotion  Keri Deep Conditioning Original Lotion Dry Skin Formula  Softly Scented  Keri Deep Conditioning Original Lotion, Fragrance Free Sensitive Skin Formula  Keri Lotion Fast Absorbing Fragrance Free Sensitive Skin Formula  Keri Lotion Fast Absorbing Softly Scented Dry Skin Formula  Keri Original Lotion  Keri Skin Renewal Lotion Keri Silky Smooth Lotion  Keri Silky Smooth Sensitive Skin Lotion  Nivea Body Creamy Conditioning Oil  Nivea Body Extra Enriched Teacher, adult education Moisturizing Lotion Nivea Crme  Nivea Skin Firming Lotion  NutraDerm 30 Skin Lotion  NutraDerm Skin Lotion  NutraDerm Therapeutic Skin Cream  NutraDerm Therapeutic Skin Lotion  ProShield Protective Hand Cream  Provon moisturizing lotion   Merchandiser, retail to address health-related social needs:  [email or web address]

## 2024-07-12 NOTE — Progress Notes (Signed)
 Called over to talk with Tiffany, surgery scheduler for Dr Edie. I had to leave Tiffany a vm but left message stating that pt originally was having surgery on 07-19-24 with Poggi (hardware removal) and surgery with Clois ([facility])  on 8-11. Pt states today in PAT that she has called over to Dr Theodoro office to inform them that she cannot have this surgery with Dr Edie on 8-7 due to needing neck surgery with Dr Clois 1st. Pt is still on the OR board for 8-7 with Dr Edie. Relayed this message for Tiffany so that she can call OR to have pt removed for her surgery with Dr Edie

## 2024-07-13 DIAGNOSIS — N189 Chronic kidney disease, unspecified: Secondary | ICD-10-CM | POA: Diagnosis not present

## 2024-07-13 DIAGNOSIS — G4733 Obstructive sleep apnea (adult) (pediatric): Secondary | ICD-10-CM | POA: Diagnosis not present

## 2024-07-13 DIAGNOSIS — J441 Chronic obstructive pulmonary disease with (acute) exacerbation: Secondary | ICD-10-CM | POA: Diagnosis not present

## 2024-07-13 DIAGNOSIS — R2689 Other abnormalities of gait and mobility: Secondary | ICD-10-CM | POA: Diagnosis not present

## 2024-07-13 DIAGNOSIS — S6290XS Unspecified fracture of unspecified wrist and hand, sequela: Secondary | ICD-10-CM | POA: Diagnosis not present

## 2024-07-17 ENCOUNTER — Ambulatory Visit: Admitting: Internal Medicine

## 2024-07-19 ENCOUNTER — Encounter: Admission: RE | Payer: Self-pay | Source: Home / Self Care

## 2024-07-19 ENCOUNTER — Ambulatory Visit: Admission: RE | Admit: 2024-07-19 | Source: Home / Self Care | Admitting: Surgery

## 2024-07-19 DIAGNOSIS — J449 Chronic obstructive pulmonary disease, unspecified: Secondary | ICD-10-CM | POA: Diagnosis not present

## 2024-07-19 SURGERY — REMOVAL, HARDWARE
Anesthesia: Choice | Site: Hand | Laterality: Left

## 2024-07-20 ENCOUNTER — Telehealth: Payer: Self-pay

## 2024-07-20 ENCOUNTER — Encounter: Payer: Self-pay | Admitting: Urgent Care

## 2024-07-20 NOTE — Telephone Encounter (Signed)
 I contacted [name] to notify her of the need to reschedule her surgery from 07/23/24 to 07/27/24 due to Dr Clois having a family emergency. I instructed her to follow the same instructions she was previously given from pre-admit testing for her new surgery date.

## 2024-07-20 NOTE — Progress Notes (Addendum)
 Perioperative / Anesthesia Services  Pre-Admission Testing Clinical Review / Pre-Operative Anesthesia Consult  Date: 07/20/24  PATIENT DEMOGRAPHICS: Name: [name] DOB: Dec 17, 1955 MRN:   [id number]  Note: Available PAT nursing documentation and vital signs have been reviewed. Clinical nursing staff has updated patient's PMH/PSHx, current medication list, and drug allergies/intolerances to ensure complete and comprehensive history available to assist care teams in MDM as it pertains to the aforementioned surgical procedure and anticipated anesthetic course. Extensive review of available clinical information personally performed. [facility] and PSHx updated with any diagnoses/procedures that  may have been inadvertently omitted during her intake with the pre-admission testing department's nursing staff.  PLANNED SURGICAL PROCEDURE(S):   Case: [id number] Date/Time: 07/23/24 0954   Procedure: ANTERIOR CERVICAL DECOMPRESSION/DISCECTOMY FUSION 2 LEVELS - C3-4 & C6-7 ANTERIOR CERVICAL DISCECTOMY AND FUSION   Anesthesia type: General   Diagnosis:      Cervical myelopathy (HCC) [G95.9]     Spinal stenosis of cervical region [M48.02]   Pre-op diagnosis:      G95.9 Cervical myelopathy     M48.02 Spinal stenosis of cervical region   Location: [facility] OR ROOM 03 / [facility]   Surgeons: Clois Fret, MD        CLINICAL DISCUSSION: [name] is a 68 y.o. female who is submitted for pre-surgical anesthesia review and clearance prior to her undergoing the above procedure. Patient is a Current Smoker (100 pack years). Pertinent PMH includes: CAD, HFpEF, pulmonary hypertension, PVD, aortic atherosclerosis, angina, HTN, HLD, T2DM, hypothyroidism, CKD-III, COPD (on supplemental oxygen ), OSAH (requires nocturnal PAP therapy), GERD (on daily PPI) anemia, OA, chronic pain syndrome, cervical stenosis with myelopathy, thoracic DDD, lumbar spondylosis, RLS, depression,  anxiety.  Patient is followed by cardiology Philippe, MD). She was last seen in the cardiology clinic on 04/16/2024; notes reviewed. At the time of her clinic visit, patient doing well overall from a cardiovascular perspective. Patient denied any chest pain, shortness of breath, PND, orthopnea, palpitations, significant peripheral edema, weakness, fatigue, vertiginous symptoms, or presyncope/syncope. Patient with a past medical history significant for cardiovascular diagnoses. Documented physical exam was grossly benign, providing no evidence of acute exacerbation and/or decompensation of the patient's known cardiovascular conditions.  Most recent myocardial perfusion imaging study was performed on 08/08/2018 revealing a normal left ventricular systolic function with an EF of 55-65%.  There were no regional wall motion abnormalities.  No artifact or left ventricular cavity size enlargement appreciated on review of imaging. SPECT images demonstrated a small defect of mild severity present in the mid anteroseptal location. There was no evidence os reversible ischemia. TID ratio = 1.17. Study determined to be normal and low risk.  Most recent TTE performed on 11/18/2020 revealed a normal left ventricular systolic function with an EF of 60-65%. There were no regional wall motion abnormalities. Left ventricular diastolic Doppler parameters consistent with pseudonormalization (G2DD).  There was severe biatrial enlargement.  Right ventricular size and function normal with a TAPSE measuring 3.2 cm  (normal range >/= 1.6 cm).  RVSP = 38 mmHg consistent with patient's known pulmonary hypertension. There was mild mitral annular calcification and aortic valve sclerosis. There was mild mitral and tricuspid valve regurgitation.  All transvalvular gradients were noted to be normal providing no evidence of hemodynamically significant valvular stenosis. Aorta normal in size with no evidence of ectasia or aneurysmal  dilatation.  Coronary CTA was performed on 10/12/2023 that demonstrated an Agatston coronary artery calcium  score of 726. This placed patient  in the 97th percentile for age, sex, and race matched controls. Calcium  depositions noted to be isolated mainly in the proximal RCA (25-49%) as well as the proximal LAD and LCx (<25%) distributions.  Study demonstrated normal coronary origin with RIGHT dominance.  Blood pressure grossly elevated at 192/115 mmHg on currently prescribed alpha-blocker (clonidine ), CCB (diltiazem ), vasodilator (hydralazine  + minoxidil ), and ARB (olmesartan ) therapies.  Of note, patient had not taken her medication prior to that visit.  Reports that blood pressure is normally better controlled at home.  Patient is on fenofibrate  therapy for her HLD diagnosis and further ASCVD prevention.  T2DM well-controlled with diet lifestyle modification alone.  Most recent hemoglobin A1c was 5.4% when checked on 10/11/2023.  Patient does have an OSAH diagnosis and is reported to be compliant with prescribed nocturnal PAP therapy.  Functional capacity is somewhat limited by patient's age, orthopedic pain, and multiple medical comorbidities.  With that said, patient able to complete all her ADLs/IADLs without significant cardiovascular limitation.  Per the [facility], patient is able to exceed 4 METS physical activity without experiencing any significant degree of angina/anginal equivalent symptoms.  No changes were made to her medication regimen.  Patient to follow-up with outpatient cardiology in 6 months or sooner if needed.  [name] is scheduled for an elective ANTERIOR CERVICAL DECOMPRESSION/DISCECTOMY FUSION 2 LEVELS on 07/23/2024 with Dr. Reeves Nine, MD.  Given patient's past medical history significant for cardiovascular diagnoses, presurgical cardiac clearance was sought by the PAT team. Per cardiology, this patient is optimized for surgery and may proceed with the planned procedural  course with a LOW risk of significant perioperative cardiovascular complications.  In review of her medication reconciliation, the patient is not noted to be taking any type of anticoagulation or antiplatelet therapies that would need to be held during her perioperative course.  Patient denies previous perioperative complications with anesthesia in the past. In review her EMR, it is noted that patient underwent a general anesthetic course here at [facility] ([facility]) in 01/2024 without documented complications.   MOST RECENT VITAL SIGNS:    07/12/2024    3:22 PM 07/10/2024    2:08 PM 07/03/2024    3:15 PM  Vitals with BMI  Height 5' 6 5' 6 5' 6  Weight 170 lbs 13 oz 167 lbs 10 oz 160 lbs 8 oz  BMI 27.58 27.06 25.92  Systolic 154 170 803  Diastolic 76 86 100  Pulse 71 76    PROVIDERS/SPECIALISTS: NOTE: Primary physician provider listed below. Patient may have been seen by APP or partner within same practice.   PROVIDER ROLE / SPECIALTY [facility] Clois Reeves, MD Neurosurgery (Surgeon) 07/03/2024  Albina GORMAN Dine, MD Primary Care Provider 07/10/2024  Florencio Shine, MD Cardiology 04/16/2024  Theotis Chancellor, MD Pulmonary Medicine 06/27/2024  Marea Mayo, MD Vascular Surgery 05/02/2024   ALLERGIES: Allergies  Allergen Reactions   Nsaids Other (See Comments)    Pt states it messes up her kidneys But takes aspirin  & ibuprofen   Thiazide-Type Diuretics     Other reaction(s): Unknown    CURRENT HOME MEDICATIONS: No current facility-administered medications for this encounter.    acetaminophen  (TYLENOL ) 650 MG CR tablet   budesonide -glycopyrrolate -formoterol (BREZTRI) 160-9-4.8 MCG/ACT AERO inhaler   busPIRone  (BUSPAR ) 30 MG tablet   Cholecalciferol  (VITAMIN D -3) 25 MCG (1000 UT) CAPS   ciclopirox (PENLAC) 8 % solution   cyanocobalamin  (VITAMIN B12) 1000 MCG tablet   diltiazem  (CARDIZEM  CD) 180 MG  24 hr capsule   escitalopram   (LEXAPRO ) 20 MG tablet   fenofibrate  micronized (LOFIBRA) 134 MG capsule   fluticasone  (FLONASE ) 50 MCG/ACT nasal spray   gabapentin  (NEURONTIN ) 800 MG tablet   hydrALAZINE  (APRESOLINE ) 10 MG tablet   levothyroxine  (SYNTHROID ) 25 MCG tablet   Multiple Vitamins-Minerals (MULTIVITAMIN WITH MINERALS) tablet   naphazoline-glycerin (CLEAR EYES REDNESS) 0.012-0.25 % SOLN   nicotine  (NICODERM CQ  - DOSED IN MG/24 HOURS) 21 mg/24hr patch   olmesartan  (BENICAR ) 40 MG tablet   OXYGEN    pantoprazole  (PROTONIX ) 40 MG tablet   pramipexole  (MIRAPEX ) 0.25 MG tablet   pyridOXINE (VITAMIN B6) 100 MG tablet   albuterol  (VENTOLIN  HFA) 108 (90 Base) MCG/ACT inhaler   Azelastine  HCl 137 MCG/SPRAY SOLN   cloNIDine  (CATAPRES ) 0.2 MG tablet   minoxidil  (LONITEN ) 10 MG tablet   NON FORMULARY    chlorhexidine  (HIBICLENS ) 4 % liquid 4 application   chlorhexidine  (HIBICLENS ) 4 % liquid 4 application   HISTORY: Past Medical History:  Diagnosis Date   (HFpEF) heart failure with preserved ejection fraction (HCC)    Anxiety    Aortic atherosclerosis (HCC)    Arthritis    Bilateral hand numbness    CAD (coronary artery disease)    Cervical myelopathy (HCC)    Cervical spinal stenosis    Chest pain at rest    Chronic pain syndrome    Chronic respiratory failure with hypoxia and hypercapnia (HCC)    CKD stage 3b, GFR 30-44 ml/min (HCC)    Community acquired pneumonia 01/10/2013   COPD (chronic obstructive pulmonary disease) (HCC)    DDD (degenerative disc disease), thoracic    Dependence on supplemental oxygen  (3L/[place] qhs PRN)    Depression    DM (diabetes mellitus), type 2 (HCC)    Dyspnea    Failed back surgical syndrome    GERD (gastroesophageal reflux disease)    Hyperlipidemia    Hypertension    Hypothyroid    Lumbar spondylosis    a.) s/p lumbar laminectomy, microdiscectomy, and L4-L5 fusion   Neuropathy    Nicotine  dependence    Normocytic anemia    OSA on CPAP    Pancreatitis 03/2024    Peripheral vascular disease (HCC)    Pulmonary hypertension (HCC)    Recurrent UTI (urinary tract infection)    Restless leg syndrome    a.) on pramipexole    Seizures (HCC)    02/13/2011 -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  no seizure since--pt does take keppra  and office note from neurologist dr. margaret on this chart   Sepsis due to pneumonia [facility])    Sinus tachycardia    Tobacco abuse    Urinary incontinence    Wound of left leg    being seen at wound clinic as of 03-23-24   Past Surgical History:  Procedure Laterality Date   CARPAL TUNNEL RELEASE Left    COLONOSCOPY WITH PROPOFOL  N/A 06/17/2022   Procedure: COLONOSCOPY WITH PROPOFOL ;  Surgeon: Maryruth Ole DASEN, MD;  Location[facility] ENDOSCOPY;  Service: Endoscopy;  Laterality: N/A;   JOINT REPLACEMENT  08/26/2011   right total knee arthroplasty, left total knee   KNEE ARTHROPLASTY  10/28/2011   Procedure: COMPUTER ASSISTED TOTAL KNEE ARTHROPLASTY;  Surgeon: Norleen CROME Rendall III;  Location: [facility] ORS;  Service: Orthopedics;  Laterality: Left;  preop femoral nerve block   LAMINECTOMY AND MICRODISCECTOMY LUMBAR  SPINE N/A    Procedure: LUMBAR LAMINECTOMY, MICRODISCECTOMY, AND L4-L5 FUSION   OPEN REDUCTION INTERNAL FIXATION (ORIF) METACARPAL Left 01/30/2024   Procedure: OPEN REDUCTION INTERNAL FIXATION (ORIF) OF LEFT 5TH METACARPAL SHAFT FRACTURE.;  Surgeon: Edie Norleen PARAS, MD;  Location: [facility] ORS;  Service: Orthopedics;  Laterality: Left;   PROCTOSCOPY N/A 06/18/2022   Procedure: RIGID PROCTOSCOPY;  Surgeon: Sheldon Standing, MD;  Location: [facility] ORS;  Service: General;  Laterality: N/A;   Family History  Problem Relation Age of Onset   Heart attack Father 58   Alzheimer's disease Mother    Breast cancer Neg Hx    Social History   Tobacco Use   Smoking status: Every Day    Current packs/day: 2.00    Average  packs/day: 2.0 packs/day for 50.0 years (100.0 ttl pk-yrs)    Types: Cigarettes    Passive exposure: Current   Smokeless tobacco: Never  Substance Use Topics   Alcohol  use: Yes    Comment: rare   LABS:  Lab Results  Component Value Date   WBC 9.1 07/12/2024   HGB 11.6 (L) 07/12/2024   HCT 35.6 (L) 07/12/2024   MCV 100.0 07/12/2024   PLT 224 07/12/2024   Lab Results  Component Value Date   NA 142 07/10/2024   CL 105 07/10/2024   K 4.3 07/10/2024   CO2 21 07/10/2024   BUN 18 07/10/2024   CREATININE 0.98 07/10/2024   EGFR 63 07/10/2024   CALCIUM  9.8 07/10/2024   PHOS 2.7 03/31/2024   ALBUMIN 4.2 07/10/2024   GLUCOSE 101 (H) 07/10/2024    ABO/RH(D) 07/12/2024 A POS   Final   Antibody Screen 07/12/2024 POS   Final   Sample Expiration 07/12/2024 07/26/2024,2359   Final   Extend sample reason 07/12/2024 NO TRANSFUSIONS OR PREGNANCY IN THE PAST 3 MONTHS   Final   Antibody Identification 07/12/2024    Final                   Value:ANTI K Performed at [facility], [address] [place], [place] [address]    MRSA, PCR 07/12/2024 NEGATIVE  NEGATIVE Final   Staphylococcus aureus 07/12/2024 NEGATIVE  NEGATIVE Final   Comment: (NOTE) The Xpert SA Assay (FDA approved for NASAL specimens in patients 71 years of age and older), is one component of a comprehensive surveillance program. It is not intended to diagnose infection nor to guide or monitor treatment. Performed at [facility], [address] [place], [place] [address]     ECG: Date: 07/12/2024  Time ECG obtained: 1546  PM Rate: 65 bpm Rhythm: normal sinus Axis (leads I and aVF): normal Intervals: PR 142 ms. QRS 84 ms. QTc 426 ms. ST segment and T wave changes: No evidence of acute T wave abnormalities or significant ST segment elevation or depression.  Evidence of a possible, age undetermined, prior infarct:  No Comparison: Similar to previous tracing obtained on 03/29/2024   IMAGING /  PROCEDURES: MR CERVICAL SPINE WO CONTRAST performed on 05/21/2024 Advanced cervical disc and facet degeneration with severe spinal stenosis at C3-4 and C6-7 and moderate spinal stenosis at C4-5 and C5-6. Associated T2 hyperintensity/edema in the spinal cord at C3-4. Severe multilevel neural foraminal stenosis as above. Prominent left facet edema at C3-4 which may be a source of neck pain.  VAS US  ABI WITH/WO TBI performed on 05/02/2024 Resting right ankle-brachial index is within normal range. The right toe-brachial index is normal.  Resting left ankle-brachial index  is within normal range. The left toe-brachial index is normal.    CT CHEST LUNG CA SCREEN LOW DOSE W/O CM performed on 03/06/2024 Lung-RADS 2, benign appearance or behavior. Continue annual screening with low-dose chest CT without contrast in 12 months. Aortic atherosclerosis  Coronary artery calcification. Enlarged pulmonic trunk, indicative of pulmonary arterial hypertension. Emphysema  CT ABDOMEN PELVIS W CONTRAST performed on 03/28/2024 Significant phlegm a tori stranding surrounding the pancreas with some pancreatic edema. Please correlate with any clinical findings of pancreatitis. No well-defined fluid collections.  Splenic vein is patent. Mild biliary duct ectasia with distended gallbladder. Further workup when appropriate. Small amount of ascites.  CT CORONARY MORPH W/CTA COR W/SCORE W/CA W/CM &/OR WO/CM performed on 10/12/2023 Coronary calcium  score of 726. This was 97th percentile for age and sex matched control. Normal coronary origin with right dominance. Mild proximal RCA stenosis (25-49%). Minimal proximal LAD and LCx stenosis (<25%). CAD-RADS 2. Mild non-obstructive CAD (25-49%). Consider non-atherosclerotic causes of chest pain. Consider preventive therapy and risk factor modification.  VAS US  CAROTID performed on 07/15/2023 There was no evidence of thrombus, dissection, atherosclerotic plaque or  stenosis in the cervical carotid system.  There was no evidence of thrombus, dissection, atherosclerotic plaque or stenosis in the cervical carotid system.  Bilateral vertebral arteries demonstrate antegrade flow.  Normal flow hemodynamics were seen in bilateral subclavian arteries.   TRANSTHORACIC ECHOCARDIOGRAM performed on 11/18/2020 Left ventricular ejection fraction, by estimation, is 60 to 65%. The left ventricle has normal function. The left ventricle has no regional wall motion abnormalities. Left ventricular diastolic parameters are consistent with Grade II diastolic dysfunction (pseudonormalization). Elevated left atrial pressure.  Right ventricular systolic function is normal. The right ventricular size is normal. There is mildly elevated pulmonary artery systolic pressure.  Left atrial size was severely dilated.  Right atrial size was severely dilated.  The mitral valve is normal in structure. Mild mitral valve regurgitation.  The aortic valve is normal in structure. Aortic valve regurgitation is not visualized. Mild aortic valve sclerosis is present, with no evidence of aortic valve stenosis.  The inferior vena cava is dilated in size with <50% respiratory variability, suggesting right atrial pressure of 15 mmHg.   MYOCARDIAL PERFUSION IMAGING STUDY (LEXISCAN ) performed on 08/08/2018 The left ventricular ejection fraction is normal (55-65%). Nuclear stress EF: 64%. There was no ST segment deviation noted during stress. There is a small defect of mild severity present in the mid anteroseptal location. The study is normal and low risk.    IMPRESSION AND PLAN: [name] has been referred for pre-anesthesia review and clearance prior to her undergoing the planned anesthetic and procedural courses. Available labs, pertinent testing, and imaging results were personally reviewed by me in preparation for upcoming operative/procedural course. [facility] medical record has been  updated following extensive record review and patient interview with PAT staff.   This patient has been appropriately cleared by cardiology with an overall LOW risk of patient experiencing significant perioperative cardiovascular complications. Based on clinical review performed today (07/20/24), barring any significant acute changes in the patient's overall condition, it is anticipated that she will be able to proceed with the planned surgical intervention. Any acute changes in clinical condition may necessitate her procedure being postponed and/or cancelled. Patient will meet with anesthesia team (MD and/or CRNA) on the day of her procedure for preoperative evaluation/assessment. Questions regarding anesthetic course will be fielded at that time.   Pre-surgical instructions were reviewed with the patient during his PAT  appointment, and questions were fielded to satisfaction by PAT clinical staff. She has been instructed on which medications that she will need to hold prior to surgery, as well as the ones that have been deemed safe/appropriate to take on the day of her procedure. As part of the general education provided by PAT, patient made aware both verbally and in writing, that she would need to abstain from the use of any illegal substances during her perioperative course. She was advised that failure to follow the provided instructions could necessitate case cancellation or result in serious perioperative complications up to and including death. Patient encouraged to contact PAT and/or her surgeon's office to discuss any questions or concerns that may arise prior to surgery; verbalized understanding.   Dorise Pereyra, MSN, APRN, FNP-C, CEN [facility]  Perioperative Services Nurse Practitioner Phone: [phone] Fax: [phone] 07/20/24 10:44 AM  NOTE: This note has been prepared using Dragon dictation software. Despite my best ability to proofread, there is always the  potential that unintentional transcriptional errors may still occur from this process.

## 2024-07-23 ENCOUNTER — Telehealth: Payer: Self-pay

## 2024-07-23 DIAGNOSIS — Z01818 Encounter for other preprocedural examination: Secondary | ICD-10-CM

## 2024-07-23 DIAGNOSIS — N898 Other specified noninflammatory disorders of vagina: Secondary | ICD-10-CM

## 2024-07-23 NOTE — Telephone Encounter (Signed)
 Patient called stating that she's got a vagina infection and would like something called in please

## 2024-07-24 NOTE — Addendum Note (Signed)
 Addended by: BRYCE MARKER AHMAD on: 07/24/2024 12:59 PM   Modules accepted: Orders

## 2024-07-25 ENCOUNTER — Ambulatory Visit (INDEPENDENT_AMBULATORY_CARE_PROVIDER_SITE_OTHER): Admitting: Internal Medicine

## 2024-07-25 DIAGNOSIS — N760 Acute vaginitis: Secondary | ICD-10-CM | POA: Diagnosis not present

## 2024-07-25 DIAGNOSIS — N898 Other specified noninflammatory disorders of vagina: Secondary | ICD-10-CM | POA: Diagnosis not present

## 2024-07-26 LAB — NUSWAB VAGINITIS PLUS (VG+)

## 2024-07-26 MED ORDER — CEFAZOLIN IN SODIUM CHLORIDE 2-0.9 GM/100ML-% IV SOLN
2.0000 g | Freq: Once | INTRAVENOUS | Status: DC
  Filled 2024-07-26: qty 100

## 2024-07-26 MED ORDER — CHLORHEXIDINE GLUCONATE 0.12 % MT SOLN: 15.0000 mL | Freq: Once | OROMUCOSAL | Status: AC

## 2024-07-26 MED ORDER — ACETAMINOPHEN 500 MG PO TABS: 1000.0000 mg | ORAL_TABLET | Freq: Once | ORAL | Status: AC

## 2024-07-26 MED ORDER — CEFAZOLIN SODIUM-DEXTROSE 2-4 GM/100ML-% IV SOLN
2.0000 g | INTRAVENOUS | Status: AC
  Administered 2024-08-17: 2 g via INTRAVENOUS

## 2024-07-26 MED ORDER — SODIUM CHLORIDE 0.9 % IV SOLN: INTRAVENOUS | Status: AC

## 2024-07-26 MED ORDER — ORAL CARE MOUTH RINSE: 15.0000 mL | Freq: Once | OROMUCOSAL | Status: AC

## 2024-07-27 ENCOUNTER — Telehealth: Payer: Self-pay

## 2024-07-27 ENCOUNTER — Emergency Department

## 2024-07-27 ENCOUNTER — Inpatient Hospital Stay
Admission: EM | Admit: 2024-07-27 | Discharge: 2024-07-28 | DRG: 189 | Disposition: A | Discharge status: Home / Self Care | Attending: Hospitalist | Admitting: Hospitalist

## 2024-07-27 ENCOUNTER — Other Ambulatory Visit: Payer: Self-pay

## 2024-07-27 ENCOUNTER — Encounter
Admission: EM | Disposition: A | Discharge status: Home / Self Care | Payer: Self-pay | Source: Home / Self Care | Attending: Hospitalist

## 2024-07-27 ENCOUNTER — Ambulatory Visit: Admission: RE | Admit: 2024-07-27 | Source: Home / Self Care | Admitting: Neurosurgery

## 2024-07-27 DIAGNOSIS — R0689 Other abnormalities of breathing: Secondary | ICD-10-CM | POA: Diagnosis not present

## 2024-07-27 DIAGNOSIS — E1122 Type 2 diabetes mellitus with diabetic chronic kidney disease: Secondary | ICD-10-CM | POA: Diagnosis present

## 2024-07-27 DIAGNOSIS — Z820 Family history of epilepsy and other diseases of the nervous system: Secondary | ICD-10-CM

## 2024-07-27 DIAGNOSIS — Z79890 Hormone replacement therapy (postmenopausal): Secondary | ICD-10-CM

## 2024-07-27 DIAGNOSIS — Z743 Need for continuous supervision: Secondary | ICD-10-CM | POA: Diagnosis not present

## 2024-07-27 DIAGNOSIS — G4733 Obstructive sleep apnea (adult) (pediatric): Secondary | ICD-10-CM | POA: Diagnosis present

## 2024-07-27 DIAGNOSIS — Z716 Tobacco abuse counseling: Secondary | ICD-10-CM

## 2024-07-27 DIAGNOSIS — E785 Hyperlipidemia, unspecified: Secondary | ICD-10-CM | POA: Diagnosis present

## 2024-07-27 DIAGNOSIS — K219 Gastro-esophageal reflux disease without esophagitis: Secondary | ICD-10-CM | POA: Diagnosis present

## 2024-07-27 DIAGNOSIS — I130 Hypertensive heart and chronic kidney disease with heart failure and stage 1 through stage 4 chronic kidney disease, or unspecified chronic kidney disease: Secondary | ICD-10-CM | POA: Diagnosis present

## 2024-07-27 DIAGNOSIS — M4326 Fusion of spine, lumbar region: Secondary | ICD-10-CM

## 2024-07-27 DIAGNOSIS — I2510 Atherosclerotic heart disease of native coronary artery without angina pectoris: Secondary | ICD-10-CM | POA: Diagnosis present

## 2024-07-27 DIAGNOSIS — N1832 Chronic kidney disease, stage 3b: Secondary | ICD-10-CM | POA: Diagnosis present

## 2024-07-27 DIAGNOSIS — Z79899 Other long term (current) drug therapy: Secondary | ICD-10-CM

## 2024-07-27 DIAGNOSIS — R0602 Shortness of breath: Secondary | ICD-10-CM | POA: Diagnosis not present

## 2024-07-27 DIAGNOSIS — J441 Chronic obstructive pulmonary disease with (acute) exacerbation: Principal | ICD-10-CM | POA: Diagnosis present

## 2024-07-27 DIAGNOSIS — Z87440 Personal history of urinary (tract) infections: Secondary | ICD-10-CM

## 2024-07-27 DIAGNOSIS — Z981 Arthrodesis status: Secondary | ICD-10-CM

## 2024-07-27 DIAGNOSIS — I272 Pulmonary hypertension, unspecified: Secondary | ICD-10-CM | POA: Diagnosis present

## 2024-07-27 DIAGNOSIS — J9622 Acute and chronic respiratory failure with hypercapnia: Secondary | ICD-10-CM | POA: Diagnosis present

## 2024-07-27 DIAGNOSIS — E039 Hypothyroidism, unspecified: Secondary | ICD-10-CM | POA: Diagnosis present

## 2024-07-27 DIAGNOSIS — G894 Chronic pain syndrome: Secondary | ICD-10-CM | POA: Diagnosis present

## 2024-07-27 DIAGNOSIS — E1151 Type 2 diabetes mellitus with diabetic peripheral angiopathy without gangrene: Secondary | ICD-10-CM | POA: Diagnosis present

## 2024-07-27 DIAGNOSIS — Z01818 Encounter for other preprocedural examination: Secondary | ICD-10-CM

## 2024-07-27 DIAGNOSIS — Z8249 Family history of ischemic heart disease and other diseases of the circulatory system: Secondary | ICD-10-CM | POA: Diagnosis not present

## 2024-07-27 DIAGNOSIS — I5032 Chronic diastolic (congestive) heart failure: Secondary | ICD-10-CM | POA: Diagnosis present

## 2024-07-27 DIAGNOSIS — F17210 Nicotine dependence, cigarettes, uncomplicated: Secondary | ICD-10-CM | POA: Diagnosis present

## 2024-07-27 DIAGNOSIS — M5412 Radiculopathy, cervical region: Secondary | ICD-10-CM | POA: Diagnosis present

## 2024-07-27 DIAGNOSIS — G2581 Restless legs syndrome: Secondary | ICD-10-CM | POA: Diagnosis present

## 2024-07-27 DIAGNOSIS — Z96653 Presence of artificial knee joint, bilateral: Secondary | ICD-10-CM | POA: Diagnosis present

## 2024-07-27 DIAGNOSIS — Z9981 Dependence on supplemental oxygen: Secondary | ICD-10-CM

## 2024-07-27 DIAGNOSIS — R0902 Hypoxemia: Secondary | ICD-10-CM

## 2024-07-27 DIAGNOSIS — I700 Atherosclerosis of aorta: Secondary | ICD-10-CM | POA: Diagnosis present

## 2024-07-27 DIAGNOSIS — J9621 Acute and chronic respiratory failure with hypoxia: Principal | ICD-10-CM | POA: Diagnosis present

## 2024-07-27 DIAGNOSIS — R140 Abdominal distension (gaseous): Secondary | ICD-10-CM | POA: Diagnosis not present

## 2024-07-27 DIAGNOSIS — E876 Hypokalemia: Secondary | ICD-10-CM | POA: Diagnosis present

## 2024-07-27 DIAGNOSIS — I10 Essential (primary) hypertension: Secondary | ICD-10-CM | POA: Diagnosis present

## 2024-07-27 DIAGNOSIS — Z888 Allergy status to other drugs, medicaments and biological substances status: Secondary | ICD-10-CM

## 2024-07-27 DIAGNOSIS — R6889 Other general symptoms and signs: Secondary | ICD-10-CM | POA: Diagnosis not present

## 2024-07-27 HISTORY — DX: Atherosclerotic heart disease of native coronary artery without angina pectoris: I25.10

## 2024-07-27 HISTORY — DX: Chest pain, unspecified: R07.9

## 2024-07-27 HISTORY — DX: Pulmonary hypertension, unspecified: I27.20

## 2024-07-27 HISTORY — DX: Tobacco use: Z72.0

## 2024-07-27 HISTORY — DX: Anesthesia of skin: R20.0

## 2024-07-27 HISTORY — DX: Other intervertebral disc degeneration, thoracic region: M51.34

## 2024-07-27 LAB — COMPREHENSIVE METABOLIC PANEL WITH GFR
ALT: 25 U/L (ref 0–44)
AST: 26 U/L (ref 15–41)
Albumin: 3.4 g/dL — ABNORMAL LOW (ref 3.5–5.0)
Alkaline Phosphatase: 13 U/L — ABNORMAL LOW (ref 38–126)
Anion gap: 11 (ref 5–15)
BUN: 11 mg/dL (ref 8–23)
CO2: 28 mmol/L (ref 22–32)
Calcium: 9.2 mg/dL (ref 8.9–10.3)
Chloride: 101 mmol/L (ref 98–111)
Creatinine, Ser: 0.97 mg/dL (ref 0.44–1.00)
GFR, Estimated: >60 mL/min (ref >60)
Glucose, Bld: 122 mg/dL — ABNORMAL HIGH (ref 70–99)
Potassium: 3.4 mmol/L — ABNORMAL LOW (ref 3.5–5.1)
Sodium: 140 mmol/L (ref 135–145)
Total Bilirubin: 1.4 mg/dL — ABNORMAL HIGH (ref 0.0–1.2)
Total Protein: 6.3 g/dL — ABNORMAL LOW (ref 6.5–8.1)

## 2024-07-27 LAB — CBC WITH DIFFERENTIAL/PLATELET
Abs Immature Granulocytes: 0.05 K/uL (ref 0.00–0.07)
Basophils Absolute: 0 K/uL (ref 0.0–0.1)
Basophils Relative: 0 %
Eosinophils Absolute: 0 K/uL (ref 0.0–0.5)
Eosinophils Relative: 0 %
HCT: 35.7 % — ABNORMAL LOW (ref 36.0–46.0)
Hemoglobin: 11.4 g/dL — ABNORMAL LOW (ref 12.0–15.0)
Immature Granulocytes: 1 %
Lymphocytes Relative: 11 %
Lymphs Abs: 1 K/uL (ref 0.7–4.0)
MCH: 32.4 pg (ref 26.0–34.0)
MCHC: 31.9 g/dL (ref 30.0–36.0)
MCV: 101.4 fL — ABNORMAL HIGH (ref 80.0–100.0)
Monocytes Absolute: 0.6 K/uL (ref 0.1–1.0)
Monocytes Relative: 7 %
Neutro Abs: 7.5 K/uL (ref 1.7–7.7)
Neutrophils Relative %: 81 %
Platelets: 230 K/uL (ref 150–400)
RBC: 3.52 MIL/uL — ABNORMAL LOW (ref 3.87–5.11)
RDW: 14.3 % (ref 11.5–15.5)
WBC: 9.2 K/uL (ref 4.0–10.5)
nRBC: 0 % (ref 0.0–0.2)

## 2024-07-27 LAB — TROPONIN I (HIGH SENSITIVITY)
Troponin I (High Sensitivity): 19 ng/L — ABNORMAL HIGH (ref <18)
Troponin I (High Sensitivity): 14 ng/L (ref <18)

## 2024-07-27 LAB — MAGNESIUM: Magnesium: 1.5 mg/dL — ABNORMAL LOW (ref 1.7–2.4)

## 2024-07-27 SURGERY — ANTERIOR CERVICAL DECOMPRESSION/DISCECTOMY FUSION 2 LEVELS
Anesthesia: General

## 2024-07-27 MED ORDER — LEVOTHYROXINE SODIUM 50 MCG PO TABS
25.0000 ug | ORAL_TABLET | Freq: Every day | ORAL | Status: DC
  Administered 2024-07-28: 25 ug via ORAL
  Filled 2024-07-27: qty 1

## 2024-07-27 MED ORDER — METHYLPREDNISOLONE SODIUM SUCC 40 MG IJ SOLR
80.0000 mg | Freq: Two times a day (BID) | INTRAMUSCULAR | Status: DC
  Administered 2024-07-27 – 2024-07-28 (×3): 80 mg via INTRAVENOUS
  Filled 2024-07-27 (×3): qty 2

## 2024-07-27 MED ORDER — IPRATROPIUM-ALBUTEROL 0.5-2.5 (3) MG/3ML IN SOLN
6.0000 mL | Freq: Once | RESPIRATORY_TRACT | Status: AC
  Administered 2024-07-27: 6 mL via RESPIRATORY_TRACT
  Filled 2024-07-27: qty 3

## 2024-07-27 MED ORDER — AZITHROMYCIN 250 MG PO TABS
500.0000 mg | ORAL_TABLET | Freq: Every day | ORAL | Status: DC
  Administered 2024-07-28: 500 mg via ORAL
  Filled 2024-07-27: qty 2

## 2024-07-27 MED ORDER — NICOTINE 21 MG/24HR TD PT24: 21.0000 mg | MEDICATED_PATCH | Freq: Every day | TRANSDERMAL | Status: DC | PRN

## 2024-07-27 MED ORDER — ENOXAPARIN SODIUM 40 MG/0.4ML IJ SOSY
40.0000 mg | PREFILLED_SYRINGE | INTRAMUSCULAR | Status: DC
  Administered 2024-07-27 – 2024-07-28 (×2): 40 mg via SUBCUTANEOUS
  Filled 2024-07-27 (×2): qty 0.4

## 2024-07-27 MED ORDER — ACETAMINOPHEN 650 MG RE SUPP: 650.0000 mg | Freq: Four times a day (QID) | RECTAL | Status: DC | PRN

## 2024-07-27 MED ORDER — HYDRALAZINE HCL 20 MG/ML IJ SOLN: 10.0000 mg | Freq: Four times a day (QID) | INTRAMUSCULAR | Status: DC | PRN

## 2024-07-27 MED ORDER — METHYLPREDNISOLONE SODIUM SUCC 125 MG IJ SOLR
125.0000 mg | Freq: Once | INTRAMUSCULAR | Status: AC
  Administered 2024-07-27: 125 mg via INTRAVENOUS
  Filled 2024-07-27: qty 2

## 2024-07-27 MED ORDER — MAGNESIUM SULFATE 2 GM/50ML IV SOLN
2.0000 g | Freq: Once | INTRAVENOUS | Status: AC
  Administered 2024-07-27: 2 g via INTRAVENOUS
  Filled 2024-07-27: qty 50

## 2024-07-27 MED ORDER — IPRATROPIUM-ALBUTEROL 0.5-2.5 (3) MG/3ML IN SOLN
RESPIRATORY_TRACT | Status: AC
Start: 2024-07-27 — End: 2024-07-27
  Filled 2024-07-27: qty 3

## 2024-07-27 MED ORDER — IPRATROPIUM-ALBUTEROL 0.5-2.5 (3) MG/3ML IN SOLN
3.0000 mL | Freq: Four times a day (QID) | RESPIRATORY_TRACT | Status: DC | PRN
  Administered 2024-07-28: 3 mL via RESPIRATORY_TRACT
  Filled 2024-07-27: qty 3

## 2024-07-27 MED ORDER — PANTOPRAZOLE SODIUM 40 MG PO TBEC
40.0000 mg | DELAYED_RELEASE_TABLET | Freq: Two times a day (BID) | ORAL | Status: DC
  Administered 2024-07-27 – 2024-07-28 (×3): 40 mg via ORAL
  Filled 2024-07-27 (×3): qty 1

## 2024-07-27 MED ORDER — DILTIAZEM HCL ER COATED BEADS 180 MG PO CP24
180.0000 mg | ORAL_CAPSULE | ORAL | Status: DC
  Administered 2024-07-27 – 2024-07-28 (×2): 180 mg via ORAL
  Filled 2024-07-27 (×2): qty 1

## 2024-07-27 MED ORDER — IRBESARTAN 75 MG PO TABS
37.5000 mg | ORAL_TABLET | Freq: Every day | ORAL | Status: DC
  Administered 2024-07-27 – 2024-07-28 (×2): 37.5 mg via ORAL
  Filled 2024-07-27 (×2): qty 0.5

## 2024-07-27 MED ORDER — HYDROMORPHONE HCL 1 MG/ML IJ SOLN: 0.5000 mg | INTRAMUSCULAR | Status: DC | PRN

## 2024-07-27 MED ORDER — POTASSIUM CHLORIDE 10 MEQ/100ML IV SOLN
10.0000 meq | INTRAVENOUS | Status: AC
  Administered 2024-07-27 (×3): 10 meq via INTRAVENOUS
  Filled 2024-07-27: qty 100

## 2024-07-27 MED ORDER — MINOXIDIL 10 MG PO TABS
10.0000 mg | ORAL_TABLET | Freq: Every day | ORAL | Status: DC
  Administered 2024-07-27 – 2024-07-28 (×2): 10 mg via ORAL
  Filled 2024-07-27 (×2): qty 1

## 2024-07-27 MED ORDER — ONDANSETRON HCL 4 MG/2ML IJ SOLN: 4.0000 mg | Freq: Four times a day (QID) | INTRAMUSCULAR | Status: DC | PRN

## 2024-07-27 MED ORDER — POLYETHYLENE GLYCOL 3350 17 G PO PACK: 17.0000 g | PACK | Freq: Every day | ORAL | Status: DC | PRN

## 2024-07-27 MED ORDER — ACETAMINOPHEN 325 MG PO TABS
650.0000 mg | ORAL_TABLET | Freq: Four times a day (QID) | ORAL | Status: DC | PRN
  Administered 2024-07-27 – 2024-07-28 (×3): 650 mg via ORAL
  Filled 2024-07-27 (×3): qty 2

## 2024-07-27 MED ORDER — VITAMIN B-12 1000 MCG PO TABS
1000.0000 ug | ORAL_TABLET | Freq: Every day | ORAL | Status: DC
  Administered 2024-07-27 – 2024-07-28 (×2): 1000 ug via ORAL
  Filled 2024-07-27 (×2): qty 1

## 2024-07-27 MED ORDER — SODIUM CHLORIDE 0.9 % IV SOLN
500.0000 mg | INTRAVENOUS | Status: AC
  Administered 2024-07-27: 500 mg via INTRAVENOUS
  Filled 2024-07-27: qty 5

## 2024-07-27 MED ORDER — ONDANSETRON HCL 4 MG PO TABS: 4.0000 mg | ORAL_TABLET | Freq: Four times a day (QID) | ORAL | Status: DC | PRN

## 2024-07-27 NOTE — ED Provider Notes (Signed)
 [facility] Provider Note    Event Date/Time   First MD Initiated Contact with Patient 07/27/24 [phone]     (approximate)   History   Respiratory Distress   HPI  [name] is a 68 y.o. female who presents to the ED for evaluation of Respiratory Distress   I review a pulmonary clinic visit from last month.  COPD, still   Patient presents to the ED from home via EMS for evaluation of shortness of breath in the past 1 day.  Increased cough but not increased sputum production.  Found to be hypoxic in the 70s on room air.  No pain, pressure, dizziness, syncope, abdominal pain, emesis   Physical Exam   Triage Vital Signs: ED Triage Vitals  Encounter Vitals Group     BP      Girls Systolic BP Percentile      Girls Diastolic BP Percentile      Boys Systolic BP Percentile      Boys Diastolic BP Percentile      Pulse      Resp      Temp      Temp src      SpO2      Weight      Height      Head Circumference      Peak Flow      Pain Score      Pain Loc      Pain Education      Exclude from Growth Chart     Most recent vital signs: Vitals:   07/27/24 0515 07/27/24 0530  BP:  (!) 183/86  Pulse: 74 69  Resp:  (!) 24  Temp:    SpO2: 99% 90%    General: Awake, no distress.  Barrel chest CV:  Good peripheral perfusion.  Resp:  Minimal tachypnea to the low 20s, no distress, diffuse wheezing and slightly decreased airflow. Abd:  No distention.  MSK:  No deformity noted.  Neuro:  No focal deficits appreciated. Other:     ED Results / Procedures / Treatments   Labs (all labs ordered are listed, but only abnormal results are displayed) Labs Reviewed  COMPREHENSIVE METABOLIC PANEL WITH GFR - Abnormal; Notable for the following components:      Result Value   Potassium 3.4 (*)    Glucose, Bld 122 (*)    Total Protein 6.3 (*)    Albumin 3.4 (*)    Alkaline Phosphatase 13 (*)    Total Bilirubin 1.4 (*)    All other components within  normal limits  CBC WITH DIFFERENTIAL/PLATELET - Abnormal; Notable for the following components:   RBC 3.52 (*)    Hemoglobin 11.4 (*)    HCT 35.7 (*)    MCV 101.4 (*)    All other components within normal limits  MAGNESIUM  - Abnormal; Notable for the following components:   Magnesium  1.5 (*)    All other components within normal limits  TROPONIN I (HIGH SENSITIVITY) - Abnormal; Notable for the following components:   Troponin I (High Sensitivity) 19 (*)    All other components within normal limits  TROPONIN I (HIGH SENSITIVITY)    EKG Sinus rhythm with a rate of 66 bpm.  Normal axis and intervals without for signs of acute ischemia.  RADIOLOGY CXR interpreted by me without evidence of acute cardiopulmonary pathology.  Official radiology report(s): DG Chest Portable 1 View Result Date: 07/27/2024 CLINICAL DATA:  68 year old female recently on  antibiotics and prednisone . Difficulty breathing, shortness of breath for 2 weeks. Smoker. EXAM: PORTABLE CHEST 1 VIEW COMPARISON:  Chest CT 03/06/2024. Radiographs 03/30/2024 and earlier. FINDINGS: Portable AP semi upright view at 0508 hours. Stable lung volumes. Cardiac and mediastinal contours stable and within normal limits, Calcified aortic atherosclerosis. Visualized tracheal air column is within normal limits. Stable mildly coarse and increased bilateral pulmonary interstitium. No pneumothorax, pulmonary edema, pleural effusion or confluent lung opacity. No acute osseous abnormality identified. Paucity of bowel gas in the upper abdomen. IMPRESSION: No acute cardiopulmonary abnormality. Electronically Signed   By: VEAR Hurst M.D.   On: 07/27/2024 06:21    PROCEDURES and INTERVENTIONS:  .1-3 Lead EKG Interpretation  Performed by: Claudene Rover, MD Authorized by: Claudene Rover, MD     Interpretation: normal     ECG rate:  70   ECG rate assessment: normal     Rhythm: sinus rhythm     Ectopy: none     Conduction: normal   .Critical  Care  Performed by: Claudene Rover, MD Authorized by: Claudene Rover, MD   Critical care provider statement:    Critical care time (minutes):  30   Critical care time was exclusive of:  Separately billable procedures and treating other patients   Critical care was necessary to treat or prevent imminent or life-threatening deterioration of the following conditions:  Respiratory failure   Critical care was time spent personally by me on the following activities:  Development of treatment plan with patient or surrogate, discussions with consultants, evaluation of patient's response to treatment, examination of patient, ordering and review of laboratory studies, ordering and review of radiographic studies, ordering and performing treatments and interventions, pulse oximetry, re-evaluation of patient's condition and review of old charts   Medications  methylPREDNISolone  sodium succinate (SOLU-MEDROL ) 125 mg/2 mL injection 125 mg (125 mg Intravenous Given 07/27/24 0503)  ipratropium-albuterol  (DUONEB) 0.5-2.5 (3) MG/3ML nebulizer solution 6 mL (6 mLs Nebulization Not Given 07/27/24 0510)     IMPRESSION / MDM / ASSESSMENT AND PLAN / ED COURSE  I reviewed the triage vital signs and the nursing notes.  Differential diagnosis includes, but is not limited to, ACS, PTX, PNA, muscle strain/spasm, PE, dissection, anxiety, pleural effusion  {Patient presents with symptoms of an acute illness or injury that is potentially life-threatening.  Patient presents with evidence of acute exacerbation and associated hypoxia requiring medical admission.    Was planned to have an ACDF today with neurosurgery, I consult with them and inform them of her impending admission and they will delay this procedure until Monday.  Clear CXR, normal WBC and no increase sputum production.  Renal function at baseline.  Troponin is fairly low and we will trend this.  Doubt ACS.  Likely secondary to her respiratory status.  Start on  steroids, provide breathing treatments and consult with medicine for admission  Clinical Course as of 07/27/24 0636  Fri Jul 27, 2024  [id number] Discussed with neurosurgery.  Discussed COPD conservation, admission and likely delays for patient's procedure this morning.  They were appreciative of the heads up. [DS]    Clinical Course User Index [DS] Claudene Rover, MD     FINAL CLINICAL IMPRESSION(S) / ED DIAGNOSES   Final diagnoses:  COPD exacerbation (HCC)  Hypoxia     Rx / DC Orders   ED Discharge Orders     None        Note:  This document was prepared using Dragon voice recognition software and may include  unintentional dictation errors.   Claudene Rover, MD 07/27/24 [phone]

## 2024-07-27 NOTE — ED Triage Notes (Signed)
 Pt BIB [facility] from home d/t Difficulty Breathing.  Pt was on ABX and Prednisone  d/t Sob for 2 weeks.  Pt was supposed to go get neck surgery at 0800 this morning.     20g LFA 97.7 HR 70  NS BP 194/85 88% on RA - has Home O2 but non-compliant.

## 2024-07-27 NOTE — H&P (Signed)
 History and Physical    [name] FMW:[id number] DOB: Oct 18, 1956 DOA: 07/27/2024  DOS: the patient was seen and examined on 07/27/2024  PCP: Albina GORMAN Dine, MD   Patient coming from: Home  I have personally briefly reviewed patient's old medical records in [facility]  Chief Complaint: Shortness of breath for 1 day  HPI: [name] is a pleasant 68 y.o. female with medical history significant for COPD on home oxygen  intermittently, HFpEF, cervical myelopathy, cervical spinal stenosis, GERD, DM, HTN, HLD, hypothyroidism, who came into the hospital via EMS for evaluation of shortness of breath started 1 day ago.  Patient also complains of increased cough but without any sputum production.  Patient has a oxygen  probe at home and checked which showed around 70% on room air.  Patient was recently started on antibiotic and prednisone  for shortness of breath 2 weeks ago.  She has been planned for a procedure ACDF for 07/27/2024 on her neck.  Patient denied any fever, chills, chest pain, palpitations.  ED Course: Upon arrival to the ED, patient is found to be hypoxemic around 88% on room air, blood pressure 183/86, heart rate 69, respiration 24.  ED discussed with neurosurgery who advised to admit the patient and they will postpone the surgery until Monday.  She was given Solu-Medrol  and DuoNebs in the emergency room with some improvement.  Her troponins were normal at 19 and 14.  Hospitalist service was consulted for evaluation for admission for acute exacerbation of COPD.  Review of Systems:  ROS  All other systems negative except as noted in the HPI.  Past Medical History:  Diagnosis Date   (HFpEF) heart failure with preserved ejection fraction (HCC)    Anxiety    Aortic atherosclerosis (HCC)    Arthritis    Bilateral hand numbness    CAD (coronary artery disease)    Cervical myelopathy (HCC)    Cervical spinal stenosis    Chest pain at rest    Chronic pain syndrome     Chronic respiratory failure with hypoxia and hypercapnia (HCC)    CKD stage 3b, GFR 30-44 ml/min (HCC)    Community acquired pneumonia 01/10/2013   COPD (chronic obstructive pulmonary disease) (HCC)    DDD (degenerative disc disease), thoracic    Dependence on supplemental oxygen  (3L/[place] qhs PRN)    Depression    DM (diabetes mellitus), type 2 (HCC)    Dyspnea    Failed back surgical syndrome    GERD (gastroesophageal reflux disease)    Hyperlipidemia    Hypertension    Hypothyroid    Lumbar spondylosis    a.) s/p lumbar laminectomy, microdiscectomy, and L4-L5 fusion   Neuropathy    Nicotine  dependence    Normocytic anemia    OSA on CPAP    Pancreatitis 03/2024   Peripheral vascular disease (HCC)    Pulmonary hypertension (HCC)    Recurrent UTI (urinary tract infection)    Restless leg syndrome    a.) on pramipexole    Seizures (HCC)    02/13/2011 -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  no seizure since--pt does take keppra  and office note from neurologist dr. margaret on this chart   Sepsis due to pneumonia [facility])    Sinus tachycardia    Tobacco abuse    Urinary incontinence    Wound of left leg  being seen at wound clinic as of 03-23-24    Past Surgical History:  Procedure Laterality Date   CARPAL TUNNEL RELEASE Left    COLONOSCOPY WITH PROPOFOL  N/A 06/17/2022   Procedure: COLONOSCOPY WITH PROPOFOL ;  Surgeon: Maryruth Ole DASEN, MD;  Location: [facility] ENDOSCOPY;  Service: Endoscopy;  Laterality: N/A;   JOINT REPLACEMENT  08/26/2011   right total knee arthroplasty, left total knee   KNEE ARTHROPLASTY  10/28/2011   Procedure: COMPUTER ASSISTED TOTAL KNEE ARTHROPLASTY;  Surgeon: Norleen CROME Rendall III;  Location: [facility] ORS;  Service: Orthopedics;  Laterality: Left;  preop femoral nerve block   LAMINECTOMY AND MICRODISCECTOMY LUMBAR SPINE N/A    Procedure:  LUMBAR LAMINECTOMY, MICRODISCECTOMY, AND L4-L5 FUSION   OPEN REDUCTION INTERNAL FIXATION (ORIF) METACARPAL Left 01/30/2024   Procedure: OPEN REDUCTION INTERNAL FIXATION (ORIF) OF LEFT 5TH METACARPAL SHAFT FRACTURE.;  Surgeon: Edie Norleen PARAS, MD;  Location: [facility] ORS;  Service: Orthopedics;  Laterality: Left;   PROCTOSCOPY N/A 06/18/2022   Procedure: RIGID PROCTOSCOPY;  Surgeon: Sheldon Standing, MD;  Location: [facility] ORS;  Service: General;  Laterality: N/A;     reports that she has been smoking cigarettes. She has a 100 pack-year smoking history. She has been exposed to tobacco smoke. She has never used smokeless tobacco. She reports current alcohol  use. She reports that she does not use drugs.  Allergies  Allergen Reactions   Nsaids Other (See Comments)    Pt states it messes up her kidneys But takes aspirin  & ibuprofen    Thiazide-Type Diuretics     Other reaction(s): Unknown    Family History  Problem Relation Age of Onset   Heart attack Father 74   Alzheimer's disease Mother    Breast cancer Neg Hx     Prior to Admission medications   Medication Sig Start Date End Date Taking? Authorizing Provider  acetaminophen  (TYLENOL ) 650 MG CR tablet Take 1,950 mg by mouth every 8 (eight) hours as needed for pain.   Yes [provider]  albuterol  (VENTOLIN  HFA) 108 (90 Base) MCG/ACT inhaler Inhale 2 puffs into the lungs every 4 (four) hours as needed for wheezing or shortness of breath. 11/29/22  Yes Bernardo Fend, DO  Azelastine  HCl 137 MCG/SPRAY SOLN Place 1 spray into the nose daily as needed (Congestion/ allergies). 05/17/24 08/15/24 Yes Poggi, Norleen PARAS, MD  budesonide -glycopyrrolate -formoterol (BREZTRI) 160-9-4.8 MCG/ACT AERO inhaler Inhale 2 puffs into the lungs in the morning and at bedtime. 07/04/24 07/04/25 Yes [provider]  busPIRone  (BUSPAR ) 30 MG tablet TAKE 1 TABLET BY MOUTH 2 TIMES DAILY. 02/13/24  Yes Albina GORMAN Dine, MD  cloNIDine  (CATAPRES ) 0.2 MG tablet Take 1  tablet (0.2 mg total) by mouth 2 (two) times daily. 07/10/24  Yes Tejan-Sie, GORMAN Dine, MD  cyanocobalamin  (VITAMIN B12) 1000 MCG tablet TAKE 1 TABLET BY MOUTH EVERY DAY *NEW PRESCRIPTION REQUEST* 06/22/24  Yes Albina GORMAN Dine, MD  diltiazem  (CARDIZEM  CD) 180 MG 24 hr capsule Take 180 mg by mouth every morning.   Yes [provider]  escitalopram  (LEXAPRO ) 20 MG tablet Take 1 tablet (20 mg total) by mouth every morning. 05/17/24  Yes Poggi, Norleen PARAS, MD  fenofibrate  micronized (LOFIBRA) 134 MG capsule TAKE 1 CAPSULE BY MOUTH DAILY BEFORE BREAKFAST. 05/29/24  Yes Albina GORMAN Dine, MD  fluticasone  (FLONASE ) 50 MCG/ACT nasal spray Place 1 spray into both nostrils daily as needed for allergies. 05/17/24 08/15/24 Yes Poggi, Norleen PARAS, MD  gabapentin  (NEURONTIN ) 800 MG tablet Take 1 tablet (800 mg total)  by mouth 3 (three) times daily. 04/02/24 07/27/24 Yes Tejan-Sie, GORMAN Dine, MD  levothyroxine  (SYNTHROID ) 25 MCG tablet TAKE 1 TABLET BY MOUTH EVERY DAY *NEW PRESCRIPTION REQUEST* Patient taking differently: Take 25 mcg by mouth daily before breakfast. 06/22/24  Yes Albina GORMAN Dine, MD  lidocaine -prilocaine (EMLA) cream :2-3 Gram(s) Topical 4 Times Daily 07/20/24  Yes [provider]  minoxidil  (LONITEN ) 10 MG tablet Take 1 tablet (10 mg total) by mouth daily. 07/10/24 10/08/24 Yes Tejan-Sie, GORMAN Dine, MD  pantoprazole  (PROTONIX ) 40 MG tablet TAKE 1 TABLET BY MOUTH EVERY DAY IN THE MORNING Patient taking differently: 40 mg every morning. 04/02/24  Yes Tejan-Sie, GORMAN Dine, MD  pramipexole  (MIRAPEX ) 0.25 MG tablet Take 1 tablet (0.25 mg total) by mouth 3 (three) times daily. 06/18/24 12/15/24 Yes Albina GORMAN Dine, MD  predniSONE  (DELTASONE ) 10 MG tablet 4 pills q day x 2 days, 3 pills q day x 2 days, 2 pills q day x 2 days, 1 pill q day x 2 days. 07/13/24  Yes [provider]  Cholecalciferol  (VITAMIN D -3) 25 MCG (1000 UT) CAPS Take 1,000 Units by mouth daily.    [provider]  ciclopirox  (PENLAC) 8 % solution Apply 1 application  topically at bedtime. 01/25/24   [provider]  fluconazole  (DIFLUCAN ) 150 MG tablet Take 150 mg by mouth once.    [provider]  hydrALAZINE  (APRESOLINE ) 10 MG tablet Take 1 tablet (10 mg total) by mouth 3 (three) times daily as needed. Please take if your blood pressure is above 150/90. You can repeat the dose as needed up with up to 3 pills per day. If your blood pressure is not improving with this, please call your doctor's office. Patient taking differently: Take 10 mg by mouth every morning. Please take if your blood pressure is above 150/90. You can repeat the dose as needed up with up to 3 pills per day. If your blood pressure is not improving with this, please call your doctor's office. 05/24/23   Mecum, Erin E, PA-C  Multiple Vitamins-Minerals (MULTIVITAMIN WITH MINERALS) tablet Take 1 tablet by mouth daily. 50 + woman Patient not taking: Reported on 07/10/2024    [provider]  naphazoline-glycerin (CLEAR EYES REDNESS) 0.012-0.25 % SOLN Place 1-2 drops into both eyes 4 (four) times daily as needed for eye irritation.    [provider]  nicotine  (NICODERM CQ  - DOSED IN MG/24 HOURS) 21 mg/24hr patch Place 21 mg onto the skin daily as needed (nicotine  dependence). Patient not taking: Reported on 07/10/2024    [provider]  NON FORMULARY Pt uses a c-pap nightly w/ oxygen     [provider]  olmesartan  (BENICAR ) 40 MG tablet Take 40 mg by mouth every morning.    [provider]  OXYGEN  Inhale 3 L into the lungs at bedtime as needed (shortness of breath).    [provider]  pyridOXINE (VITAMIN B6) 100 MG tablet TAKE 1 TABLET BY MOUTH EVERY DAY *NEW PRESCRIPTION REQUEST* 06/22/24   Albina GORMAN Dine, MD    Physical Exam: Vitals:   07/27/24 0600 07/27/24 0630 07/27/24 0700 07/27/24 0810  BP: (!) 183/89 (!) 187/92 (!) 163/89 (!) 172/84  Pulse: 69 72 68 74  Resp:   18    Temp:      TempSrc:      SpO2: 94% 96% 92% (!) 89%  Weight:      Height:        Physical Exam  Constitutional: Alert, awake, calm, comfortable HEENT: Neck supple Respiratory: Bilateral decreased air entry at the bases, scattered wheezes on both lung field Cardiovascular: Regular rate and rhythm, no murmurs / rubs / gallops. No extremity edema. 2+ pedal pulses. No carotid bruits.  Abdomen: Soft, no tenderness, Bowel sounds positive.  Musculoskeletal: no clubbing / cyanosis. Good ROM, no contractures. Normal muscle tone.  Skin: no rashes, lesions, ulcers. Neurologic: CN 2-12 grossly intact. Sensation intact, No focal deficit identified Psychiatric: Alert and oriented x 3. Normal mood.    Labs on Admission: I have personally reviewed following labs and imaging studies  CBC: Recent Labs  Lab 07/27/24 0454  WBC 9.2  NEUTROABS 7.5  HGB 11.4*  HCT 35.7*  MCV 101.4*  PLT 230   Basic Metabolic Panel: Recent Labs  Lab 07/27/24 0454  NA 140  K 3.4*  CL 101  CO2 28  GLUCOSE 122*  BUN 11  CREATININE 0.97  CALCIUM  9.2  MG 1.5*   GFR: Estimated Creatinine Clearance: 59.2 mL/min (by C-G formula based on SCr of 0.97 mg/dL). Liver Function Tests: Recent Labs  Lab 07/27/24 0454  AST 26  ALT 25  ALKPHOS 13*  BILITOT 1.4*  PROT 6.3*  ALBUMIN 3.4*   No results for input(s): LIPASE, AMYLASE in the last 168 hours. No results for input(s): AMMONIA in the last 168 hours. Coagulation Profile: No results for input(s): INR, PROTIME in the last 168 hours. Cardiac Enzymes: Recent Labs  Lab 07/27/24 0454 07/27/24 0655  TROPONINIHS 19* 14   BNP (last 3 results) No results for input(s): BNP in the last 8760 hours. HbA1C: No results for input(s): HGBA1C in the last 72 hours. CBG: No results for input(s): GLUCAP in the last 168 hours. Lipid Profile: No results for input(s): CHOL, HDL, LDLCALC, TRIG, CHOLHDL, LDLDIRECT in the last 72  hours. Thyroid  Function Tests: No results for input(s): TSH, T4TOTAL, FREET4, T3FREE, THYROIDAB in the last 72 hours. Anemia Panel: No results for input(s): VITAMINB12, FOLATE, FERRITIN, TIBC, IRON, RETICCTPCT in the last 72 hours. Urine analysis:    Component Value Date/Time   COLORURINE YELLOW (A) 07/06/2022 2010   APPEARANCEUR Cloudy (A) 10/17/2023 1440   LABSPEC 1.015 07/06/2022 2010   PHURINE 6.0 07/06/2022 2010   GLUCOSEU 2+ (A) 10/17/2023 1440   HGBUR MODERATE (A) 07/06/2022 2010   BILIRUBINUR negative 03/08/2024 1459   BILIRUBINUR Negative 10/17/2023 1440   KETONESUR NEGATIVE 07/06/2022 2010   PROTEINUR Negative 03/08/2024 1459   PROTEINUR Negative 10/17/2023 1440   PROTEINUR 100 (A) 07/06/2022 2010   UROBILINOGEN 0.2 (A) 03/08/2024 1459   UROBILINOGEN 0.2 04/14/2014 1217   NITRITE Positive 03/08/2024 1459   NITRITE Negative 10/17/2023 1440   NITRITE POSITIVE (A) 07/06/2022 2010   LEUKOCYTESUR Large (3+) (A) 03/08/2024 1459   LEUKOCYTESUR 1+ (A) 10/17/2023 1440   LEUKOCYTESUR LARGE (A) 07/06/2022 2010    Radiological Exams on Admission: I have personally reviewed images DG Chest Portable 1 View Result Date: 07/27/2024 CLINICAL DATA:  68 year old female recently on antibiotics and prednisone . Difficulty breathing, shortness of breath for 2 weeks. Smoker. EXAM: PORTABLE CHEST 1 VIEW COMPARISON:  Chest CT 03/06/2024. Radiographs 03/30/2024 and earlier. FINDINGS: Portable AP semi upright view at 0508 hours. Stable lung volumes. Cardiac and mediastinal contours stable and within normal limits, Calcified aortic atherosclerosis. Visualized tracheal air column is within normal limits. Stable mildly coarse and increased bilateral pulmonary interstitium. No pneumothorax, pulmonary edema, pleural effusion or confluent lung opacity. No acute osseous abnormality identified. Paucity of  bowel gas in the upper abdomen. IMPRESSION: No acute cardiopulmonary abnormality.  Electronically Signed   By: VEAR Hurst M.D.   On: 07/27/2024 06:21    EKG: My personal interpretation of EKG shows: Sinus rhythm, no ST elevation    Assessment/Plan Principal Problem:   COPD exacerbation (HCC) Active Problems:   Acute on chronic respiratory failure with hypoxia (HCC)   Chronic diastolic CHF (congestive heart failure) (HCC)   GERD (gastroesophageal reflux disease)   Essential hypertension   Acquired hypothyroidism   Cervical radiculopathy    Assessment and Plan: 68 year old female with multiple medical problems including but not limited to COPD on intermittent home oxygen  at, CHF, HTN, hypothyroidism, GERD, cervical and lumbar radiculopathy who is being planned for ACDF for today, came in for acute worsening shortness of breath and hypoxemia at home.  1.  Acute hypoxemic respiratory failure secondary to COPD exacerbation - She will be admitted to hospital as inpatient - She was given nebulization and steroid which improved her shortness of breath. - She will be started on Solu-Medrol  IV 40 every 6 and nebulizer every 6. - Patient has increasing cough and I would like to add doxycycline  to cover for atypical bacteria for possible bronchitis. - Patient has acute ongoing smoking, counseling was done  2.  COPD exacerbation - Plan as mentioned above  3.  History of diastolic congestive heart failure - Does not appear to be in exacerbation - Continue to monitor  4.  HTN - Will continue to monitor blood pressure - Will resume her home regimen - Will place her on hydralazine  as needed for systolic blood pressure more than 160  5.  Cervical radiculopathy - Patient is being planned for ACDF likely on Monday - Further plans per neurosurgical team  6.  Hypokalemia/hypomagnesemia - Will replace and check magnesium  and potassium level in the morning     DVT prophylaxis: Lovenox  Code Status: Full Code Family Communication: None  Disposition Plan: Home  Consults  called: Neurosurgery by ED provider  Admission status: Inpatient, Med-Surg   Nena Rebel, MD [facility] 07/27/2024, 9:06 AM

## 2024-07-27 NOTE — Telephone Encounter (Signed)
 Surgery was canceled today. Patient admitted for COPD exacerbation.

## 2024-07-27 NOTE — Telephone Encounter (Signed)
-----   Message from [facility] sent at 07/27/2024 10:05 AM EDT ----- May reschedule at end of September or early October?

## 2024-07-27 NOTE — Plan of Care (Signed)

## 2024-07-28 DIAGNOSIS — J441 Chronic obstructive pulmonary disease with (acute) exacerbation: Secondary | ICD-10-CM | POA: Diagnosis not present

## 2024-07-28 LAB — CBC
HCT: 36.6 % (ref 36.0–46.0)
Hemoglobin: 12.1 g/dL (ref 12.0–15.0)
MCH: 33.2 pg (ref 26.0–34.0)
MCHC: 33.1 g/dL (ref 30.0–36.0)
MCV: 100.5 fL — ABNORMAL HIGH (ref 80.0–100.0)
Platelets: 257 K/uL (ref 150–400)
RBC: 3.64 MIL/uL — ABNORMAL LOW (ref 3.87–5.11)
RDW: 13.8 % (ref 11.5–15.5)
WBC: 10.9 K/uL — ABNORMAL HIGH (ref 4.0–10.5)
nRBC: 0 % (ref 0.0–0.2)

## 2024-07-28 LAB — HIV ANTIBODY (ROUTINE TESTING W REFLEX): HIV Screen 4th Generation wRfx: NONREACTIVE

## 2024-07-28 LAB — COMPREHENSIVE METABOLIC PANEL WITH GFR
ALT: 24 U/L (ref 0–44)
AST: 22 U/L (ref 15–41)
Albumin: 3.8 g/dL (ref 3.5–5.0)
Alkaline Phosphatase: 19 U/L — ABNORMAL LOW (ref 38–126)
Anion gap: 7 (ref 5–15)
BUN: 23 mg/dL (ref 8–23)
CO2: 27 mmol/L (ref 22–32)
Calcium: 9.8 mg/dL (ref 8.9–10.3)
Chloride: 104 mmol/L (ref 98–111)
Creatinine, Ser: 1.02 mg/dL — ABNORMAL HIGH (ref 0.44–1.00)
GFR, Estimated: >60 mL/min (ref >60)
Glucose, Bld: 211 mg/dL — ABNORMAL HIGH (ref 70–99)
Potassium: 4.4 mmol/L (ref 3.5–5.1)
Sodium: 138 mmol/L (ref 135–145)
Total Bilirubin: 0.8 mg/dL (ref 0.0–1.2)
Total Protein: 7.2 g/dL (ref 6.5–8.1)

## 2024-07-28 LAB — PROTIME-INR
INR: 1.1 (ref 0.8–1.2)
Prothrombin Time: 14.4 s (ref 11.4–15.2)

## 2024-07-28 MED ORDER — PREDNISONE 20 MG PO TABS: 40.0000 mg | ORAL_TABLET | Freq: Every day | ORAL | Status: DC

## 2024-07-28 MED ORDER — PREDNISONE 20 MG PO TABS: 40.0000 mg | ORAL_TABLET | Freq: Every day | ORAL | 0 refills | Status: AC

## 2024-07-28 MED ORDER — IBUPROFEN 400 MG PO TABS: 600.0000 mg | ORAL_TABLET | Freq: Once | ORAL | Status: DC

## 2024-07-28 MED ORDER — PRAMIPEXOLE DIHYDROCHLORIDE 0.25 MG PO TABS
0.2500 mg | ORAL_TABLET | Freq: Once | ORAL | Status: AC
  Administered 2024-07-28: 0.25 mg via ORAL
  Filled 2024-07-28 (×2): qty 1

## 2024-07-28 MED ORDER — CLONIDINE HCL 0.1 MG PO TABS: 0.2000 mg | ORAL_TABLET | Freq: Two times a day (BID) | ORAL | Status: DC

## 2024-07-28 MED ORDER — ACETAMINOPHEN ER 650 MG PO TBCR: 1300.0000 mg | EXTENDED_RELEASE_TABLET | Freq: Three times a day (TID) | ORAL | Status: AC | PRN

## 2024-07-28 MED ORDER — AZITHROMYCIN 500 MG PO TABS: 500.0000 mg | ORAL_TABLET | Freq: Every day | ORAL | 0 refills | Status: AC

## 2024-07-28 NOTE — Plan of Care (Signed)

## 2024-07-28 NOTE — Discharge Summary (Signed)
 Physician Discharge Summary   [name]  female DOB: [birth year]  FMW:[id number]  PCP: Albina GORMAN Dine, MD  Admit date: 07/27/2024 Discharge date: 07/28/2024  Admitted From: home Disposition:  home CODE STATUS: Full code   Hospital Course:  For full details, please see H&P, progress notes, consult notes and ancillary notes.  Briefly,  [name] is a pleasant 68 y.o. female with medical history significant for COPD on home oxygen  intermittently, HFpEF, cervical myelopathy, cervical spinal stenosis, DM, HTN, hypothyroidism, who came into the hospital via EMS for evaluation of shortness of breath started 1 day ago.    Patient also complains of increased cough but without any sputum production.  Patient has a oxygen  probe at home and checked which showed around 70% on room air.  Patient was recently started on antibiotic and prednisone  for shortness of breath 2 weeks ago.  She has been planned for a procedure ACDF for 07/27/2024 on her neck.    1.  Acute on chronic hypoxemic respiratory failure  2/2 COPD exacerbation On 3L O2 at baseline --pt received IV solumedrol 125 mg f/b 80 mg x3.  Pt was started on azithromycin .  The day after admission, pt reported feeling better, was back on 3L home O2 and asked to be discharged.  Pt was discharged on 3 more days of azithro and prednisone  (total 5 day course)  Ongoing smoking counseling was done on admission.   --Not taking Nicotine  patch PTA   3.  History of diastolic congestive heart failure - Does not appear to be in exacerbation   4.  HTN --not taking hydralazine  PTA --cont home regimen as below   5.  Cervical radiculopathy - Further plans per neurosurgical team   6.  Hypokalemia/hypomagnesemia --supplemented PRN    Unless noted above, medications under STOP list are ones pt was not taking PTA.  Discharge Diagnoses:  Principal Problem:   COPD exacerbation (HCC) Active Problems:   Acute on chronic respiratory  failure with hypoxia (HCC)   Chronic diastolic CHF (congestive heart failure) (HCC)   GERD (gastroesophageal reflux disease)   Essential hypertension   Acquired hypothyroidism   Cervical radiculopathy   30 Day Unplanned Readmission Risk Score    Flowsheet Row ED to Hosp-Admission (Current) from 07/27/2024 in [facility]  30 Day Unplanned Readmission Risk Score (%) 15.59 Filed at 07/28/2024 1200    This score is the patient's risk of an unplanned readmission within 30 days of being discharged (0 -100%). The score is based on dignosis, age, lab data, medications, orders, and past utilization.   Low:  0-14.9   Medium: 15-21.9   High: 22-29.9   Extreme: 30 and above         Discharge Instructions:  Allergies as of 07/28/2024       Reactions   Nsaids Other (See Comments)   Pt states it messes up her kidneys But takes aspirin  & ibuprofen    Thiazide-type Diuretics    Other reaction(s): Unknown        Medication List     STOP taking these medications    ciclopirox 8 % solution Commonly known as: PENLAC   fluconazole  150 MG tablet Commonly known as: DIFLUCAN    hydrALAZINE  10 MG tablet Commonly known as: APRESOLINE    multivitamin with minerals tablet   nicotine  21 mg/24hr patch Commonly known as: NICODERM CQ  - dosed in mg/24 hours       TAKE these medications    acetaminophen   650 MG CR tablet Commonly known as: TYLENOL  Take 2 tablets (1,300 mg total) by mouth every 8 (eight) hours as needed for pain. Home med. What changed:  how much to take additional instructions   albuterol  108 (90 Base) MCG/ACT inhaler Commonly known as: VENTOLIN  HFA Inhale 2 puffs into the lungs every 4 (four) hours as needed for wheezing or shortness of breath.   Azelastine  HCl 137 MCG/SPRAY Soln Place 1 spray into the nose daily as needed (Congestion/ allergies).   azithromycin  500 MG tablet Commonly known as: ZITHROMAX  Take 1 tablet (500 mg  total) by mouth daily for 3 days. Start taking on: July 29, 2024   budesonide -glycopyrrolate -formoterol 160-9-4.8 MCG/ACT Aero inhaler Commonly known as: BREZTRI Inhale 2 puffs into the lungs in the morning and at bedtime.   busPIRone  30 MG tablet Commonly known as: BUSPAR  TAKE 1 TABLET BY MOUTH 2 TIMES DAILY.   cloNIDine  0.2 MG tablet Commonly known as: CATAPRES  Take 1 tablet (0.2 mg total) by mouth 2 (two) times daily.   cyanocobalamin  1000 MCG tablet Commonly known as: VITAMIN B12 TAKE 1 TABLET BY MOUTH EVERY DAY *NEW PRESCRIPTION REQUEST*   diltiazem  180 MG 24 hr capsule Commonly known as: CARDIZEM  CD Take 180 mg by mouth every morning.   escitalopram  20 MG tablet Commonly known as: LEXAPRO  Take 1 tablet (20 mg total) by mouth every morning.   fenofibrate  micronized 134 MG capsule Commonly known as: LOFIBRA TAKE 1 CAPSULE BY MOUTH DAILY BEFORE BREAKFAST.   fluticasone  50 MCG/ACT nasal spray Commonly known as: FLONASE  Place 1 spray into both nostrils daily as needed for allergies.   gabapentin  800 MG tablet Commonly known as: NEURONTIN  Take 1 tablet (800 mg total) by mouth 3 (three) times daily.   levothyroxine  25 MCG tablet Commonly known as: SYNTHROID  TAKE 1 TABLET BY MOUTH EVERY DAY *NEW PRESCRIPTION REQUEST* What changed: See the new instructions.   lidocaine -prilocaine cream Commonly known as: EMLA :2-3 Gram(s) Topical 4 Times Daily   minoxidil  10 MG tablet Commonly known as: LONITEN  Take 1 tablet (10 mg total) by mouth daily.   naphazoline-glycerin 0.012-0.25 % Soln Commonly known as: CLEAR EYES REDNESS Place 1-2 drops into both eyes 4 (four) times daily as needed for eye irritation.   NON FORMULARY Pt uses a c-pap nightly w/ oxygen    olmesartan  40 MG tablet Commonly known as: BENICAR  Take 40 mg by mouth every morning.   OXYGEN  Inhale 3 L into the lungs at bedtime as needed (shortness of breath).   pantoprazole  40 MG tablet Commonly known  as: PROTONIX  TAKE 1 TABLET BY MOUTH EVERY DAY IN THE MORNING What changed: See the new instructions.   pramipexole  0.25 MG tablet Commonly known as: MIRAPEX  Take 1 tablet (0.25 mg total) by mouth 3 (three) times daily.   predniSONE  20 MG tablet Commonly known as: DELTASONE  Take 2 tablets (40 mg total) by mouth daily with breakfast for 3 days. Start taking on: July 29, 2024 What changed:  medication strength how much to take how to take this when to take this additional instructions   pyridOXINE 100 MG tablet Commonly known as: VITAMIN B6 TAKE 1 TABLET BY MOUTH EVERY DAY *NEW PRESCRIPTION REQUEST*   Vitamin D -3 25 MCG (1000 UT) Caps Take 1,000 Units by mouth daily.         Follow-up Information     Albina GORMAN Dine, MD Follow up in 1 week(s).   Specialty: Internal Medicine Contact information: [address] [place] [address]  [phone]                 Allergies  Allergen Reactions   Nsaids Other (See Comments)    Pt states it messes up her kidneys But takes aspirin  & ibuprofen    Thiazide-Type Diuretics     Other reaction(s): Unknown     The results of significant diagnostics from this hospitalization (including imaging, microbiology, ancillary and laboratory) are listed below for reference.   Consultations:   Procedures/Studies: DG Chest Portable 1 View Result Date: 07/27/2024 CLINICAL DATA:  68 year old female recently on antibiotics and prednisone . Difficulty breathing, shortness of breath for 2 weeks. Smoker. EXAM: PORTABLE CHEST 1 VIEW COMPARISON:  Chest CT 03/06/2024. Radiographs 03/30/2024 and earlier. FINDINGS: Portable AP semi upright view at 0508 hours. Stable lung volumes. Cardiac and mediastinal contours stable and within normal limits, Calcified aortic atherosclerosis. Visualized tracheal air column is within normal limits. Stable mildly coarse and increased bilateral pulmonary interstitium. No pneumothorax, pulmonary edema,  pleural effusion or confluent lung opacity. No acute osseous abnormality identified. Paucity of bowel gas in the upper abdomen. IMPRESSION: No acute cardiopulmonary abnormality. Electronically Signed   By: VEAR Hurst M.D.   On: 07/27/2024 06:21      Labs: BNP (last 3 results) No results for input(s): BNP in the last 8760 hours. Basic Metabolic Panel: Recent Labs  Lab 07/27/24 0454 07/28/24 0602  NA 140 138  K 3.4* 4.4  CL 101 104  CO2 28 27  GLUCOSE 122* 211*  BUN 11 23  CREATININE 0.97 1.02*  CALCIUM  9.2 9.8  MG 1.5*  --    Liver Function Tests: Recent Labs  Lab 07/27/24 0454 07/28/24 0602  AST 26 22  ALT 25 24  ALKPHOS 13* 19*  BILITOT 1.4* 0.8  PROT 6.3* 7.2  ALBUMIN 3.4* 3.8   No results for input(s): LIPASE, AMYLASE in the last 168 hours. No results for input(s): AMMONIA in the last 168 hours. CBC: Recent Labs  Lab 07/27/24 0454 07/28/24 0602  WBC 9.2 10.9*  NEUTROABS 7.5  --   HGB 11.4* 12.1  HCT 35.7* 36.6  MCV 101.4* 100.5*  PLT 230 257   Cardiac Enzymes: No results for input(s): CKTOTAL, CKMB, CKMBINDEX, TROPONINI in the last 168 hours. BNP: Invalid input(s): POCBNP CBG: No results for input(s): GLUCAP in the last 168 hours. D-Dimer No results for input(s): DDIMER in the last 72 hours. Hgb A1c No results for input(s): HGBA1C in the last 72 hours. Lipid Profile No results for input(s): CHOL, HDL, LDLCALC, TRIG, CHOLHDL, LDLDIRECT in the last 72 hours. Thyroid  function studies No results for input(s): TSH, T4TOTAL, T3FREE, THYROIDAB in the last 72 hours.  Invalid input(s): FREET3 Anemia work up No results for input(s): VITAMINB12, FOLATE, FERRITIN, TIBC, IRON, RETICCTPCT in the last 72 hours. Urinalysis    Component Value Date/Time   COLORURINE YELLOW (A) 07/06/2022 2010   APPEARANCEUR Cloudy (A) 10/17/2023 1440   LABSPEC 1.015 07/06/2022 2010   PHURINE 6.0 07/06/2022 2010    GLUCOSEU 2+ (A) 10/17/2023 1440   HGBUR MODERATE (A) 07/06/2022 2010   BILIRUBINUR negative 03/08/2024 1459   BILIRUBINUR Negative 10/17/2023 1440   KETONESUR NEGATIVE 07/06/2022 2010   PROTEINUR Negative 03/08/2024 1459   PROTEINUR Negative 10/17/2023 1440   PROTEINUR 100 (A) 07/06/2022 2010   UROBILINOGEN 0.2 (A) 03/08/2024 1459   UROBILINOGEN 0.2 04/14/2014 1217   NITRITE Positive 03/08/2024 1459   NITRITE Negative 10/17/2023 1440   NITRITE POSITIVE (A) 07/06/2022 2010   LEUKOCYTESUR  Large (3+) (A) 03/08/2024 1459   LEUKOCYTESUR 1+ (A) 10/17/2023 1440   LEUKOCYTESUR LARGE (A) 07/06/2022 2010   Sepsis Labs Recent Labs  Lab 07/27/24 0454 07/28/24 0602  WBC 9.2 10.9*   Microbiology No results found for this or any previous visit (from the past 240 hours).   Total time spend on discharging this patient, including the last patient exam, discussing the hospital stay, instructions for ongoing care as it relates to all pertinent caregivers, as well as preparing the medical discharge records, prescriptions, and/or referrals as applicable, is 35 minutes.    Ellouise Haber, MD  [facility] 07/28/2024, 12:13 PM

## 2024-07-30 ENCOUNTER — Ambulatory Visit: Payer: Self-pay

## 2024-07-30 ENCOUNTER — Ambulatory Visit: Admission: RE | Admit: 2024-07-30 | Source: Home / Self Care

## 2024-07-30 ENCOUNTER — Encounter: Admission: RE | Payer: Self-pay | Source: Home / Self Care

## 2024-07-30 DIAGNOSIS — N898 Other specified noninflammatory disorders of vagina: Secondary | ICD-10-CM

## 2024-07-30 SURGERY — SIGMOIDOSCOPY, FLEXIBLE
Anesthesia: General

## 2024-07-30 NOTE — Telephone Encounter (Signed)
 Spoke with Sophie at Dr Tyna office. She will discuss with Dr Theotis and call me back.

## 2024-07-30 NOTE — Telephone Encounter (Signed)
 Received a call back from Sophie with Dr Theotis. Dr Theotis would like to see her this week for evaluation prior to clearing her/determining appropriateness of rescheduling her surgery. Sophie has left a message for Ms Glasper to return her call to schedule an appointment. She asked that we send a clearance form to their office.  Form has been faxed to Sophie's attention.

## 2024-07-31 ENCOUNTER — Ambulatory Visit: Admitting: Internal Medicine

## 2024-08-02 ENCOUNTER — Telehealth: Payer: Self-pay | Admitting: Neurosurgery

## 2024-08-02 NOTE — Telephone Encounter (Signed)
 Left message for patient to return call after she sees Dr Theotis to discuss surgery dates. I also spoke with her Medicare case manager, who was already aware of the situation and plans to speak with her after her appointment with Dr Theotis as well.

## 2024-08-02 NOTE — Telephone Encounter (Signed)
 [name], the patient's Medicare case manager from [facility] is calling to let our office know that the patient was scheduled to see Dr. Theotis today but her appointment was moved to 08/06/2024.   [phone].

## 2024-08-02 NOTE — Telephone Encounter (Signed)
 Ms Tapia called our office to inform me that she is now seeing Dr Theotis on Monday afternoon. I informed her that we will need Dr Aletha approval for a timeline for surgery. I asked that she call me after her appointment with an update.

## 2024-08-02 NOTE — Telephone Encounter (Signed)
 Please see below message from the answering service.     Media Information  Document Information  AMB Hines GRUMBLING ANSWERING SERVICE CALL  08/01/2024 09:17  Attached To:  Erminio GORMAN King  Source Information  Default, Provider, MD

## 2024-08-03 ENCOUNTER — Other Ambulatory Visit: Payer: Self-pay

## 2024-08-03 ENCOUNTER — Emergency Department
Admission: EM | Admit: 2024-08-03 | Discharge: 2024-08-03 | Disposition: A | Discharge status: Home / Self Care | Attending: Emergency Medicine | Admitting: Emergency Medicine

## 2024-08-03 ENCOUNTER — Emergency Department

## 2024-08-03 DIAGNOSIS — J441 Chronic obstructive pulmonary disease with (acute) exacerbation: Secondary | ICD-10-CM | POA: Insufficient documentation

## 2024-08-03 DIAGNOSIS — R0602 Shortness of breath: Secondary | ICD-10-CM | POA: Diagnosis not present

## 2024-08-03 DIAGNOSIS — R300 Dysuria: Secondary | ICD-10-CM | POA: Diagnosis not present

## 2024-08-03 DIAGNOSIS — I10 Essential (primary) hypertension: Secondary | ICD-10-CM | POA: Diagnosis not present

## 2024-08-03 DIAGNOSIS — N3000 Acute cystitis without hematuria: Secondary | ICD-10-CM | POA: Diagnosis not present

## 2024-08-03 DIAGNOSIS — R531 Weakness: Secondary | ICD-10-CM | POA: Diagnosis not present

## 2024-08-03 DIAGNOSIS — Z743 Need for continuous supervision: Secondary | ICD-10-CM | POA: Diagnosis not present

## 2024-08-03 LAB — CBC WITH DIFFERENTIAL/PLATELET
Abs Immature Granulocytes: 0.02 K/uL (ref 0.00–0.07)
Basophils Absolute: 0 K/uL (ref 0.0–0.1)
Basophils Relative: 0 %
Eosinophils Absolute: 0 K/uL (ref 0.0–0.5)
Eosinophils Relative: 1 %
HCT: 39 % (ref 36.0–46.0)
Hemoglobin: 12.9 g/dL (ref 12.0–15.0)
Immature Granulocytes: 0 %
Lymphocytes Relative: 18 %
Lymphs Abs: 1.4 K/uL (ref 0.7–4.0)
MCH: 32.4 pg (ref 26.0–34.0)
MCHC: 33.1 g/dL (ref 30.0–36.0)
MCV: 98 fL (ref 80.0–100.0)
Monocytes Absolute: 0.5 K/uL (ref 0.1–1.0)
Monocytes Relative: 7 %
Neutro Abs: 5.7 K/uL (ref 1.7–7.7)
Neutrophils Relative %: 74 %
Platelets: 253 K/uL (ref 150–400)
RBC: 3.98 MIL/uL (ref 3.87–5.11)
RDW: 13.3 % (ref 11.5–15.5)
WBC: 7.7 K/uL (ref 4.0–10.5)
nRBC: 0 % (ref 0.0–0.2)

## 2024-08-03 LAB — URINALYSIS, ROUTINE W REFLEX MICROSCOPIC
Bilirubin Urine: NEGATIVE
Glucose, UA: NEGATIVE mg/dL
Hgb urine dipstick: NEGATIVE
Ketones, ur: NEGATIVE mg/dL
Nitrite: NEGATIVE
Protein, ur: NEGATIVE mg/dL
Specific Gravity, Urine: 1.005 (ref 1.005–1.030)
WBC, UA: >50 WBC/hpf (ref 0–5)
pH: 8 (ref 5.0–8.0)

## 2024-08-03 LAB — COMPREHENSIVE METABOLIC PANEL WITH GFR
ALT: 19 U/L (ref 0–44)
AST: 17 U/L (ref 15–41)
Albumin: 3.9 g/dL (ref 3.5–5.0)
Alkaline Phosphatase: 13 U/L — ABNORMAL LOW (ref 38–126)
Anion gap: 12 (ref 5–15)
BUN: 15 mg/dL (ref 8–23)
CO2: 31 mmol/L (ref 22–32)
Calcium: 9.6 mg/dL (ref 8.9–10.3)
Chloride: 95 mmol/L — ABNORMAL LOW (ref 98–111)
Creatinine, Ser: 0.93 mg/dL (ref 0.44–1.00)
GFR, Estimated: >60 mL/min (ref >60)
Glucose, Bld: 100 mg/dL — ABNORMAL HIGH (ref 70–99)
Potassium: 4.1 mmol/L (ref 3.5–5.1)
Sodium: 138 mmol/L (ref 135–145)
Total Bilirubin: 0.9 mg/dL (ref 0.0–1.2)
Total Protein: 6.4 g/dL — ABNORMAL LOW (ref 6.5–8.1)

## 2024-08-03 LAB — BLOOD GAS, VENOUS
Acid-Base Excess: 13.3 mmol/L — ABNORMAL HIGH (ref 0.0–2.0)
Bicarbonate: 39.3 mmol/L — ABNORMAL HIGH (ref 20.0–28.0)
O2 Saturation: 89.3 %
Patient temperature: 37
pCO2, Ven: 54 mmHg (ref 44–60)
pH, Ven: 7.47 — ABNORMAL HIGH (ref 7.25–7.43)
pO2, Ven: 55 mmHg — ABNORMAL HIGH (ref 32–45)

## 2024-08-03 LAB — RESP PANEL BY RT-PCR (RSV, FLU A&B, COVID)  RVPGX2
Influenza A by PCR: NEGATIVE
Influenza B by PCR: NEGATIVE
Resp Syncytial Virus by PCR: NEGATIVE
SARS Coronavirus 2 by RT PCR: NEGATIVE

## 2024-08-03 LAB — LACTIC ACID, PLASMA: Lactic Acid, Venous: 0.6 mmol/L (ref 0.5–1.9)

## 2024-08-03 LAB — LIPASE, BLOOD: Lipase: 27 U/L (ref 11–51)

## 2024-08-03 LAB — TROPONIN I (HIGH SENSITIVITY): Troponin I (High Sensitivity): 13 ng/L (ref <18)

## 2024-08-03 MED ORDER — PREDNISONE 20 MG PO TABS: 60.0000 mg | ORAL_TABLET | Freq: Every day | ORAL | 0 refills | Status: AC

## 2024-08-03 MED ORDER — OXYCODONE-ACETAMINOPHEN 5-325 MG PO TABS
1.0000 | ORAL_TABLET | Freq: Once | ORAL | Status: AC
  Administered 2024-08-03: 1 via ORAL
  Filled 2024-08-03: qty 1

## 2024-08-03 MED ORDER — SODIUM CHLORIDE 0.9 % IV SOLN
1.0000 g | Freq: Once | INTRAVENOUS | Status: AC
  Administered 2024-08-03: 1 g via INTRAVENOUS
  Filled 2024-08-03: qty 10

## 2024-08-03 MED ORDER — ALBUTEROL SULFATE (2.5 MG/3ML) 0.083% IN NEBU
2.5000 mg | INHALATION_SOLUTION | Freq: Once | RESPIRATORY_TRACT | Status: AC
  Administered 2024-08-03: 2.5 mg via RESPIRATORY_TRACT
  Filled 2024-08-03: qty 3

## 2024-08-03 MED ORDER — CEPHALEXIN 500 MG PO CAPS: 500.0000 mg | ORAL_CAPSULE | Freq: Two times a day (BID) | ORAL | 0 refills | Status: AC

## 2024-08-03 MED ORDER — METHYLPREDNISOLONE SODIUM SUCC 125 MG IJ SOLR
125.0000 mg | Freq: Once | INTRAMUSCULAR | Status: AC
  Administered 2024-08-03: 125 mg via INTRAVENOUS
  Filled 2024-08-03: qty 2

## 2024-08-03 MED ORDER — IPRATROPIUM-ALBUTEROL 0.5-2.5 (3) MG/3ML IN SOLN
3.0000 mL | Freq: Once | RESPIRATORY_TRACT | Status: AC
  Administered 2024-08-03: 3 mL via RESPIRATORY_TRACT
  Filled 2024-08-03: qty 3

## 2024-08-03 NOTE — ED Notes (Signed)
 Pt given DC instructions. Pt verbalized understanding of medications and follow up care. Pt taken from ED in wheelchair by this RN. Pt in NAD at time of departure.

## 2024-08-03 NOTE — Telephone Encounter (Signed)
 Pt is seeing Dr Theotis on 08/06/24.

## 2024-08-03 NOTE — ED Provider Notes (Signed)
 [facility] Provider Note    Event Date/Time   First MD Initiated Contact with Patient 08/03/24 1526     (approximate)   History   Shortness of Breath   HPI  [name] is a 68 y.o. female with a history of COPD, chronic respiratory failure requiring 3 L O2 by nasal cannula, hypertension, and GERD who presents with shortness of breath and chest pain.  The patient states that the chest pain started last night.  It is in the lower part of her chest and described as pressure.  She then started become more short of breath this morning.  She also reports dysuria that has been going on for 1 to 2 weeks and some generalized malaise and weakness over the last few days.  I reviewed the past medical records.  The patient was admitted from to the hospital service from 8/15 to 8/16 for COPD exacerbation.   Physical Exam   Triage Vital Signs: ED Triage Vitals  Encounter Vitals Group     BP --      Girls Systolic BP Percentile --      Girls Diastolic BP Percentile --      Boys Systolic BP Percentile --      Boys Diastolic BP Percentile --      Pulse --      Resp --      Temp --      Temp src --      SpO2 08/03/24 1526 93 %     Weight 08/03/24 1527 170 lb 12.8 oz (77.5 kg)     Height 08/03/24 1527 5' 6 (1.676 m)     Head Circumference --      Peak Flow --      Pain Score 08/03/24 1526 7     Pain Loc --      Pain Education --      Exclude from Growth Chart --     Most recent vital signs: Vitals:   08/03/24 1700 08/03/24 1933  BP: (!) 152/67 129/62  Pulse: 75 82  Resp: (!) 22 (!) 25  Temp:  98.7 F (37.1 C)  SpO2: 93% 94%     General: Alert, somewhat weak appearing but in no distress.  CV:  Good peripheral perfusion.  Resp:  Increased respiratory effort.  Lung sounds slightly diminished bilaterally.  No significant wheezing. Abd:  Soft and nontender.  No distention.  Other:  No peripheral edema.   ED Results / Procedures / Treatments    Labs (all labs ordered are listed, but only abnormal results are displayed) Labs Reviewed  COMPREHENSIVE METABOLIC PANEL WITH GFR - Abnormal; Notable for the following components:      Result Value   Chloride 95 (*)    Glucose, Bld 100 (*)    Total Protein 6.4 (*)    Alkaline Phosphatase 13 (*)    All other components within normal limits  BLOOD GAS, VENOUS - Abnormal; Notable for the following components:   pH, Ven 7.47 (*)    pO2, Ven 55 (*)    Bicarbonate 39.3 (*)    Acid-Base Excess 13.3 (*)    All other components within normal limits  URINALYSIS, ROUTINE W REFLEX MICROSCOPIC - Abnormal; Notable for the following components:   Color, Urine STRAW (*)    APPearance CLEAR (*)    Leukocytes,Ua LARGE (*)    Bacteria, UA MANY (*)    All other components within normal limits  RESP  PANEL BY RT-PCR (RSV, FLU A&B, COVID)  [id number]  LACTIC ACID, PLASMA  CBC WITH DIFFERENTIAL/PLATELET  LIPASE, BLOOD  TROPONIN I (HIGH SENSITIVITY)  TROPONIN I (HIGH SENSITIVITY)     EKG  ED ECG REPORT I, Waylon Cassis, the attending physician, personally viewed and interpreted this ECG.  Date: 08/03/2024 EKG Time: 1527 Rate: 73 Rhythm: normal sinus rhythm QRS Axis: normal Intervals: normal ST/T Wave abnormalities: Nonspecific ST abnormalities Narrative Interpretation: no evidence of acute ischemia    RADIOLOGY  Chest x-ray: I independently viewed and interpreted the images; there is no focal consolidation or edema   PROCEDURES:  Critical Care performed: No  Procedures   MEDICATIONS ORDERED IN ED: Medications  ipratropium-albuterol  (DUONEB) 0.5-2.5 (3) MG/3ML nebulizer solution 3 mL (3 mLs Nebulization Given 08/03/24 1545)  ipratropium-albuterol  (DUONEB) 0.5-2.5 (3) MG/3ML nebulizer solution 3 mL (3 mLs Nebulization Given 08/03/24 1545)  methylPREDNISolone  sodium succinate (SOLU-MEDROL ) 125 mg/2 mL injection 125 mg (125 mg Intravenous Given 08/03/24 1709)   oxyCODONE -acetaminophen  (PERCOCET/ROXICET) 5-325 MG per tablet 1 tablet (1 tablet Oral Given 08/03/24 1710)  cefTRIAXone  (ROCEPHIN ) 1 g in sodium chloride  0.9 % 100 mL IVPB (0 g Intravenous Stopped 08/03/24 1934)  albuterol  (PROVENTIL ) (2.5 MG/3ML) 0.083% nebulizer solution 2.5 mg (2.5 mg Nebulization Given 08/03/24 1851)     IMPRESSION / MDM / ASSESSMENT AND PLAN / ED COURSE  I reviewed the triage vital signs and the nursing notes.  68 year old female with PMH as noted above presents with shortness of breath and chest pain since last night along with some urinary symptoms and generalized malaise for a few days.  Differential diagnosis includes, but is not limited to, COPD exacerbation, acute bronchitis, pneumonia, COVID-19, other viral syndrome, UTI, other infection, ACS, less likely CHF.  We will obtain chest x-ray, lab workup, give bronchodilators and steroid, and reassess.  Patient's presentation is most consistent with acute presentation with potential threat to life or bodily function.  The patient is on the cardiac monitor to evaluate for evidence of arrhythmia and/or significant heart rate changes.  ----------------------------------------- 6:54 PM on 08/03/2024 -----------------------------------------  Workup is reassuring.  Chest x-ray is clear.  VBG does not show hypercapnia.  CMP and CBC show no acute findings.  Troponin is negative.  Given the duration of the symptoms there is no indication for repeat.  Urinalysis does show findings consistent with a UTI, however there is no  indication of systemic infection or sepsis.  I did consider inpatient admission although the patient would prefer to go home possible.  O2 saturation is now in the mid 90s on her baseline 3 L nasal cannula.  I have prescribed a course of Keflex  for the UTI in addition to a dose of IV ceftriaxone  here.  I have also prescribed a prednisone  course.  The patient has bronchodilators at home.  I gave strict return  precautions, and she expressed understanding.   FINAL CLINICAL IMPRESSION(S) / ED DIAGNOSES   Final diagnoses:  COPD exacerbation (HCC)  Acute cystitis without hematuria     Rx / DC Orders   ED Discharge Orders          Ordered    predniSONE  (DELTASONE ) 20 MG tablet  Daily with breakfast        08/03/24 1853    cephALEXin  (KEFLEX ) 500 MG capsule  2 times daily        08/03/24 1853             Note:  This document was prepared using Dragon  voice recognition software and may include unintentional dictation errors.    Jacolyn Pae, MD 08/03/24 2328

## 2024-08-03 NOTE — ED Triage Notes (Signed)
 Pt arrived via EMS for SOB. Pt sts that she has been SOB. Per EMS pt has a hx of COPD and was seen for the same recently. Per EMS pt has clear lung sounds, pt took her self off of 3l/min via [place] at home prior to EMS getting on scene. EMS sts that pt was ambulatory for them with no resp distress. Pt shows no signs of distress at this.

## 2024-08-03 NOTE — Discharge Instructions (Addendum)
 Take the antibiotic as prescribed and finish the full 1 week course.  Use the prednisone  over the next 4 days.  Use your albuterol  via nebulizer or inhaler every 4-6 hours over the next several days.  Return to the ER immediately for new, worsening, or persistent severe shortness of breath, fever, weakness, or any other new or worsening symptoms that concern you.

## 2024-08-03 NOTE — ED Notes (Signed)
 Introduced self to pt. Pt has call bell in reach. Abx still infusing at this time. Pt has no needs or requests at this time.

## 2024-08-06 ENCOUNTER — Other Ambulatory Visit: Payer: Self-pay

## 2024-08-06 DIAGNOSIS — G959 Disease of spinal cord, unspecified: Secondary | ICD-10-CM

## 2024-08-06 DIAGNOSIS — Z01818 Encounter for other preprocedural examination: Secondary | ICD-10-CM

## 2024-08-06 DIAGNOSIS — J449 Chronic obstructive pulmonary disease, unspecified: Secondary | ICD-10-CM | POA: Diagnosis not present

## 2024-08-06 DIAGNOSIS — M4802 Spinal stenosis, cervical region: Secondary | ICD-10-CM

## 2024-08-06 DIAGNOSIS — G4733 Obstructive sleep apnea (adult) (pediatric): Secondary | ICD-10-CM | POA: Diagnosis not present

## 2024-08-06 NOTE — Telephone Encounter (Signed)
 Ms Brereton called to inform me that she has been cleared by Dr Theotis for surgery any time.  I am waiting on the signed clearance form to confirm this. In the meantime, I have rescheduled her surgery to 08/17/24.

## 2024-08-06 NOTE — Progress Notes (Signed)
 Pulmonary clearance form completed in office by Dr. Theotis for upcoming neurosurgery procedure. Will fax today to Dr. Elliot office. Copy given to patient.

## 2024-08-06 NOTE — Telephone Encounter (Signed)
 [name] called this afternoon to inform me that she has been cleared by Dr Theotis for surgery any time.  I am waiting on the signed clearance form to confirm this. In the meantime, I have rescheduled her surgery to 08/17/24.

## 2024-08-07 ENCOUNTER — Encounter: Admitting: Orthopedic Surgery

## 2024-08-07 ENCOUNTER — Encounter: Payer: Self-pay | Admitting: Internal Medicine

## 2024-08-07 ENCOUNTER — Ambulatory Visit (INDEPENDENT_AMBULATORY_CARE_PROVIDER_SITE_OTHER): Admitting: Internal Medicine

## 2024-08-07 VITALS — BP 134/80 | HR 79 | Ht 66.0 in | Wt 161.0 lb

## 2024-08-07 DIAGNOSIS — R7303 Prediabetes: Secondary | ICD-10-CM

## 2024-08-07 DIAGNOSIS — F419 Anxiety disorder, unspecified: Secondary | ICD-10-CM

## 2024-08-07 DIAGNOSIS — E782 Mixed hyperlipidemia: Secondary | ICD-10-CM | POA: Diagnosis not present

## 2024-08-07 DIAGNOSIS — N3000 Acute cystitis without hematuria: Secondary | ICD-10-CM | POA: Diagnosis not present

## 2024-08-07 DIAGNOSIS — F32A Depression, unspecified: Secondary | ICD-10-CM

## 2024-08-07 DIAGNOSIS — M47816 Spondylosis without myelopathy or radiculopathy, lumbar region: Secondary | ICD-10-CM | POA: Diagnosis not present

## 2024-08-07 DIAGNOSIS — Z0130 Encounter for examination of blood pressure without abnormal findings: Secondary | ICD-10-CM

## 2024-08-07 MED ORDER — GABAPENTIN 800 MG PO TABS
800.0000 mg | ORAL_TABLET | Freq: Three times a day (TID) | ORAL | 0 refills | Status: DC
Start: 2024-08-07 — End: 2024-08-14

## 2024-08-07 MED ORDER — BUSPIRONE HCL 30 MG PO TABS: 30.0000 mg | ORAL_TABLET | Freq: Two times a day (BID) | ORAL | 1 refills | Status: DC

## 2024-08-07 MED ORDER — FENOFIBRATE MICRONIZED 134 MG PO CAPS: 134.0000 mg | ORAL_CAPSULE | Freq: Every day | ORAL | 0 refills | Status: DC

## 2024-08-07 NOTE — Telephone Encounter (Signed)
 Received signed clearance form from Dr Aletha office stating that she is moderate risk and pulm wise cleared for cervical spine surgery.

## 2024-08-07 NOTE — Progress Notes (Signed)
 Established Patient Office Visit  Subjective:  Patient ID: [name], female    DOB: Feb 22, 1956  Age: 68 y.o. MRN: [id number]  Chief Complaint  Patient presents with   Follow-up    Hospital follow up    No new complaints, here for lab review and medication refills. Labs reviewed and notable for well controlled lipids.     No other concerns at this time.   Past Medical History:  Diagnosis Date   (HFpEF) heart failure with preserved ejection fraction (HCC)    Anxiety    Aortic atherosclerosis (HCC)    Arthritis    Bilateral hand numbness    CAD (coronary artery disease)    Cervical myelopathy (HCC)    Cervical spinal stenosis    Chest pain at rest    Chronic pain syndrome    Chronic respiratory failure with hypoxia and hypercapnia (HCC)    CKD stage 3b, GFR 30-44 ml/min (HCC)    Community acquired pneumonia 01/10/2013   COPD (chronic obstructive pulmonary disease) (HCC)    DDD (degenerative disc disease), thoracic    Dependence on supplemental oxygen  (3L/[place] qhs PRN)    Depression    DM (diabetes mellitus), type 2 (HCC)    Dyspnea    Failed back surgical syndrome    GERD (gastroesophageal reflux disease)    Hyperlipidemia    Hypertension    Hypothyroid    Lumbar spondylosis    a.) Razi Hickle/p lumbar laminectomy, microdiscectomy, and L4-L5 fusion   Neuropathy    Nicotine  dependence    Normocytic anemia    OSA on CPAP    Pancreatitis 03/2024   Peripheral vascular disease (HCC)    Pulmonary hypertension (HCC)    Recurrent UTI (urinary tract infection)    Restless leg syndrome    a.) on pramipexole    Seizures (HCC)    02/13/2011 -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  no seizure since--pt does take keppra  and office note from neurologist dr. margaret on this chart   Sepsis due to pneumonia [facility])    Sinus tachycardia    Tobacco abuse     Urinary incontinence    Wound of left leg    being seen at wound clinic as of 03-23-24    Past Surgical History:  Procedure Laterality Date   CARPAL TUNNEL RELEASE Left    COLONOSCOPY WITH PROPOFOL  N/A 06/17/2022   Procedure: COLONOSCOPY WITH PROPOFOL ;  Surgeon: Maryruth Ole DASEN, MD;  Location: [facility] ENDOSCOPY;  Service: Endoscopy;  Laterality: N/A;   JOINT REPLACEMENT  08/26/2011   right total knee arthroplasty, left total knee   KNEE ARTHROPLASTY  10/28/2011   Procedure: COMPUTER ASSISTED TOTAL KNEE ARTHROPLASTY;  Surgeon: Norleen CROME Rendall III;  Location: [facility] ORS;  Service: Orthopedics;  Laterality: Left;  preop femoral nerve block   LAMINECTOMY AND MICRODISCECTOMY LUMBAR SPINE N/A    Procedure: LUMBAR LAMINECTOMY, MICRODISCECTOMY, AND L4-L5 FUSION   OPEN REDUCTION INTERNAL FIXATION (ORIF) METACARPAL Left 01/30/2024   Procedure: OPEN REDUCTION INTERNAL FIXATION (ORIF) OF LEFT 5TH METACARPAL SHAFT FRACTURE.;  Surgeon: Edie Norleen PARAS, MD;  Location: [facility] ORS;  Service: Orthopedics;  Laterality: Left;   PROCTOSCOPY N/A 06/18/2022   Procedure: RIGID PROCTOSCOPY;  Surgeon: Sheldon Standing, MD;  Location: [facility] ORS;  Service: General;  Laterality: N/A;    Social History   Socioeconomic History   Marital status: Widowed  Spouse name: Not on file   Number of children: Not on file   Years of education: Not on file   Highest education level: Not on file  Occupational History   Not on file  Tobacco Use   Smoking status: Every Day    Current packs/day: 2.00    Average packs/day: 2.0 packs/day for 50.0 years (100.0 ttl pk-yrs)    Types: Cigarettes    Passive exposure: Current   Smokeless tobacco: Never  Vaping Use   Vaping status: Every Day   Substances: Nicotine , Flavoring  Substance and Sexual Activity   Alcohol  use: Yes    Comment: rare   Drug use: No   Sexual activity: Yes    Birth control/protection: Post-menopausal  Other Topics Concern   Not on file  Social History Narrative    ** Merged History Encounter **       Social Drivers of Health   Financial Resource Strain: Low Risk  (03/09/2024)   Received from [facility]   Overall Financial Resource Strain (CARDIA)    Difficulty of Paying Living Expenses: Not hard at all  Food Insecurity: No Food Insecurity (07/27/2024)   Hunger Vital Sign    Worried About Running Out of Food in the Last Year: Never true    Ran Out of Food in the Last Year: Never true  Transportation Needs: No Transportation Needs (07/27/2024)   PRAPARE - Administrator, Civil Service (Medical): No    Lack of Transportation (Non-Medical): No  Physical Activity: Not on file  Stress: Not on file  Social Connections: Moderately Integrated (07/27/2024)   Social Connection and Isolation Panel    Frequency of Communication with Friends and Family: Three times a week    Frequency of Social Gatherings with Friends and Family: More than three times a week    Attends Religious Services: More than 4 times per year    Active Member of Clubs or Organizations: Yes    Attends Banker Meetings: More than 4 times per year    Marital Status: Widowed  Intimate Partner Violence: Not At Risk (07/27/2024)   Humiliation, Afraid, Rape, and Kick questionnaire    Fear of Current or Ex-Partner: No    Emotionally Abused: No    Physically Abused: No    Sexually Abused: No    Family History  Problem Relation Age of Onset   Heart attack Father 30   Alzheimer'Kadisha Goodine disease Mother    Breast cancer Neg Hx     Allergies  Allergen Reactions   Nsaids Other (See Comments)    Pt states it messes up her kidneys But takes aspirin  & ibuprofen    Thiazide-Type Diuretics     Other reaction(Zaila Crew): Unknown    Outpatient Medications Prior to Visit  Medication Sig   acetaminophen  (TYLENOL ) 650 MG CR tablet Take 2 tablets (1,300 mg total) by mouth every 8 (eight) hours as needed for pain. Home med.   albuterol  (VENTOLIN  HFA) 108 (90 Base)  MCG/ACT inhaler Inhale 2 puffs into the lungs every 4 (four) hours as needed for wheezing or shortness of breath.   Azelastine  HCl 137 MCG/SPRAY SOLN Place 1 spray into the nose daily as needed (Congestion/ allergies).   budesonide -glycopyrrolate -formoterol (BREZTRI) 160-9-4.8 MCG/ACT AERO inhaler Inhale 2 puffs into the lungs in the morning and at bedtime.   cephALEXin  (KEFLEX ) 500 MG capsule Take 1 capsule (500 mg total) by mouth 2 (two) times daily for 7 days.   Cholecalciferol  (VITAMIN  D-3) 25 MCG (1000 UT) CAPS Take 1,000 Units by mouth daily.   cloNIDine  (CATAPRES ) 0.2 MG tablet Take 1 tablet (0.2 mg total) by mouth 2 (two) times daily.   cyanocobalamin  (VITAMIN B12) 1000 MCG tablet TAKE 1 TABLET BY MOUTH EVERY DAY *NEW PRESCRIPTION REQUEST*   diltiazem  (CARDIZEM  CD) 180 MG 24 hr capsule Take 180 mg by mouth every morning.   escitalopram  (LEXAPRO ) 20 MG tablet Take 1 tablet (20 mg total) by mouth every morning.   fluticasone  (FLONASE ) 50 MCG/ACT nasal spray Place 1 spray into both nostrils daily as needed for allergies.   levothyroxine  (SYNTHROID ) 25 MCG tablet TAKE 1 TABLET BY MOUTH EVERY DAY *NEW PRESCRIPTION REQUEST* (Patient taking differently: Take 25 mcg by mouth daily before breakfast.)   lidocaine -prilocaine (EMLA) cream :2-3 Gram(Anand Tejada) Topical 4 Times Daily   minoxidil  (LONITEN ) 10 MG tablet Take 1 tablet (10 mg total) by mouth daily.   naphazoline-glycerin (CLEAR EYES REDNESS) 0.012-0.25 % SOLN Place 1-2 drops into both eyes 4 (four) times daily as needed for eye irritation.   NON FORMULARY Pt uses a c-pap nightly w/ oxygen    olmesartan  (BENICAR ) 40 MG tablet Take 40 mg by mouth every morning.   OXYGEN  Inhale 3 L into the lungs at bedtime as needed (shortness of breath).   pantoprazole  (PROTONIX ) 40 MG tablet TAKE 1 TABLET BY MOUTH EVERY DAY IN THE MORNING (Patient taking differently: 40 mg every morning.)   pramipexole  (MIRAPEX ) 0.25 MG tablet Take 1 tablet (0.25 mg total) by mouth 3  (three) times daily.   predniSONE  (DELTASONE ) 20 MG tablet Take 3 tablets (60 mg total) by mouth daily with breakfast for 4 days. Start the day after the ER visit   pyridOXINE (VITAMIN B6) 100 MG tablet TAKE 1 TABLET BY MOUTH EVERY DAY *NEW PRESCRIPTION REQUEST*   [DISCONTINUED] busPIRone  (BUSPAR ) 30 MG tablet TAKE 1 TABLET BY MOUTH 2 TIMES DAILY.   [DISCONTINUED] fenofibrate  micronized (LOFIBRA) 134 MG capsule TAKE 1 CAPSULE BY MOUTH DAILY BEFORE BREAKFAST.   [DISCONTINUED] gabapentin  (NEURONTIN ) 800 MG tablet Take 1 tablet (800 mg total) by mouth 3 (three) times daily.   Facility-Administered Medications Prior to Visit  Medication Dose Route Frequency Provider   acetaminophen  (TYLENOL ) tablet 1,000 mg  1,000 mg Oral Once Vicci Camellia Glatter, MD   ceFAZolin  (ANCEF ) IVPB 2g/100 mL premix  2 g Intravenous 60 min Pre-Op Clois Fret, MD   chlorhexidine  (HIBICLENS ) 4 % liquid 4 application  60 mL Topical Once Duffy, Karin, PA-C   chlorhexidine  (HIBICLENS ) 4 % liquid 4 application  60 mL Topical Once Duffy, Karin, PA-C   chlorhexidine  (PERIDEX ) 0.12 % solution 15 mL  15 mL Mouth/Throat Once Piscitello, Fairy POUR, MD   Or   Oral care mouth rinse  15 mL Mouth Rinse Once Piscitello, Fairy POUR, MD    Review of Systems  Constitutional: Negative.   HENT: Negative.    Eyes: Negative.   Respiratory:  Positive for shortness of breath (on exertion).   Cardiovascular: Negative.   Gastrointestinal:  Positive for constipation.  Genitourinary: Negative.   Musculoskeletal:  Positive for back pain and falls.  Skin: Negative.   Neurological:  Positive for sensory change and headaches (chronic).  Endo/Heme/Allergies: Negative.   Psychiatric/Behavioral: Negative.         Objective:   BP 134/80   Pulse 79   Ht 5' 6 (1.676 m)   Wt 161 lb (73 kg)   SpO2 (!) 88%   BMI 25.99 kg/m  Vitals:   08/07/24 1312  BP: 134/80  Pulse: 79  Height: 5' 6 (1.676 m)  Weight: 161 lb (73 kg)  SpO2:  (!) 88%  BMI (Calculated): 26    Physical Exam   No results found for any visits on 08/07/24.  Recent Results (from the past 2160 hours)  POCT CBG (Fasting - Glucose)     Status: Abnormal   Collection Time: 07/10/24  2:36 PM  Result Value Ref Range   Glucose Fasting, POC 113 (A) 70 - 99 mg/dL  Lipid panel     Status: None   Collection Time: 07/10/24  3:14 PM  Result Value Ref Range   Cholesterol, Total 171 100 - 199 mg/dL   Triglycerides 88 0 - 149 mg/dL   HDL 82 >60 mg/dL   VLDL Cholesterol Cal 16 5 - 40 mg/dL   LDL Chol Calc (NIH) 73 0 - 99 mg/dL   Chol/HDL Ratio 2.1 0.0 - 4.4 ratio    Comment:                                   T. Chol/HDL Ratio                                             Men  Women                               1/2 Avg.Risk  3.4    3.3                                   Avg.Risk  5.0    4.4                                2X Avg.Risk  9.6    7.1                                3X Avg.Risk 23.4   11.0   Comprehensive metabolic panel with GFR     Status: Abnormal   Collection Time: 07/10/24  3:14 PM  Result Value Ref Range   Glucose 101 (H) 70 - 99 mg/dL   BUN 18 8 - 27 mg/dL   Creatinine, Ser 9.01 0.57 - 1.00 mg/dL   eGFR 63 >40 fO/fpw/8.26   BUN/Creatinine Ratio 18 12 - 28   Sodium 142 134 - 144 mmol/L   Potassium 4.3 3.5 - 5.2 mmol/L   Chloride 105 96 - 106 mmol/L   CO2 21 20 - 29 mmol/L   Calcium  9.8 8.7 - 10.3 mg/dL   Total Protein 6.6 6.0 - 8.5 g/dL   Albumin 4.2 3.9 - 4.9 g/dL   Globulin, Total 2.4 1.5 - 4.5 g/dL   Bilirubin Total 0.3 0.0 - 1.2 mg/dL   Alkaline Phosphatase 20 (L) 44 - 121 IU/L   AST 20 0 - 40 IU/L   ALT 19 0 - 32 IU/L  Surgical pcr screen     Status: None   Collection Time: 07/12/24  3:39 PM   Specimen: Nasal Mucosa; Nasal  Swab  Result Value Ref Range   MRSA, PCR NEGATIVE NEGATIVE   Staphylococcus aureus NEGATIVE NEGATIVE    Comment: (NOTE) The Xpert SA Assay (FDA approved for NASAL specimens in patients 75 years of age  and older), is one component of a comprehensive surveillance program. It is not intended to diagnose infection nor to guide or monitor treatment. Performed at [facility], [address] [place], [place] [address]   CBC     Status: Abnormal   Collection Time: 07/12/24  4:00 PM  Result Value Ref Range   WBC 9.1 4.0 - 10.5 K/uL   RBC 3.56 (L) 3.87 - 5.11 MIL/uL   Hemoglobin 11.6 (L) 12.0 - 15.0 g/dL   HCT 64.3 (L) 63.9 - 53.9 %   MCV 100.0 80.0 - 100.0 fL   MCH 32.6 26.0 - 34.0 pg   MCHC 32.6 30.0 - 36.0 g/dL   RDW 86.2 88.4 - 84.4 %   Platelets 224 150 - 400 K/uL   nRBC 0.0 0.0 - 0.2 %    Comment: Performed at [facility], [address] [place], [place] [address]  Type and screen [facility]     Status: None   Collection Time: 07/12/24  4:00 PM  Result Value Ref Range   ABO/RH(D) A POS    Antibody Screen POS    Sample Expiration 07/26/2024,2359    Extend sample reason NO TRANSFUSIONS OR PREGNANCY IN THE PAST 3 MONTHS    Antibody Identification      ANTI K Performed at [facility], [address] [place] [address] [place], [place] [address]   NuSwab Vaginitis Plus (VG+)     Status: None   Collection Time: 07/25/24  2:29 PM  Result Value Ref Range   Atopobium vaginae CANCELED Score    Comment: Test not performed. Insufficient specimen to perform or complete analysis.  Result canceled by the ancillary.    BVAB 2 CANCELED     Comment: Test not performed  Result canceled by the ancillary.    Megasphaera 1 CANCELED     Comment: Test not performed  Result canceled by the ancillary.    Candida albicans, NAA CANCELED     Comment: Test not performed  Result canceled by the ancillary.    Candida glabrata, NAA CANCELED     Comment: Test not performed  Result canceled by the ancillary.    Trich vag by NAA CANCELED     Comment: Test not performed  Result canceled by the ancillary.    Chlamydia trachomatis, NAA CANCELED      Comment: Test not performed  Result canceled by the ancillary.    Neisseria gonorrhoeae, NAA CANCELED     Comment: Test not performed  Result canceled by the ancillary.   Comprehensive metabolic panel with GFR     Status: Abnormal   Collection Time: 07/27/24  4:54 AM  Result Value Ref Range   Sodium 140 135 - 145 mmol/L   Potassium 3.4 (L) 3.5 - 5.1 mmol/L   Chloride 101 98 - 111 mmol/L   CO2 28 22 - 32 mmol/L   Glucose, Bld 122 (H) 70 - 99 mg/dL    Comment: Glucose reference range applies only to samples taken after fasting for at least 8 hours.   BUN 11 8 - 23 mg/dL   Creatinine, Ser 9.02 0.44 - 1.00 mg/dL   Calcium  9.2 8.9 - 10.3 mg/dL   Total Protein 6.3 (L) 6.5 - 8.1 g/dL  Albumin 3.4 (L) 3.5 - 5.0 g/dL   AST 26 15 - 41 U/L   ALT 25 0 - 44 U/L   Alkaline Phosphatase 13 (L) 38 - 126 U/L   Total Bilirubin 1.4 (H) 0.0 - 1.2 mg/dL   GFR, Estimated >39 >39 mL/min    Comment: (NOTE) Calculated using the CKD-EPI Creatinine Equation (2021)    Anion gap 11 5 - 15    Comment: Performed at [facility], [address] [place], [place] [address]  CBC with Differential/Platelet     Status: Abnormal   Collection Time: 07/27/24  4:54 AM  Result Value Ref Range   WBC 9.2 4.0 - 10.5 K/uL   RBC 3.52 (L) 3.87 - 5.11 MIL/uL   Hemoglobin 11.4 (L) 12.0 - 15.0 g/dL   HCT 64.2 (L) 63.9 - 53.9 %   MCV 101.4 (H) 80.0 - 100.0 fL   MCH 32.4 26.0 - 34.0 pg   MCHC 31.9 30.0 - 36.0 g/dL   RDW 85.6 88.4 - 84.4 %   Platelets 230 150 - 400 K/uL   nRBC 0.0 0.0 - 0.2 %   Neutrophils Relative % 81 %   Neutro Abs 7.5 1.7 - 7.7 K/uL   Lymphocytes Relative 11 %   Lymphs Abs 1.0 0.7 - 4.0 K/uL   Monocytes Relative 7 %   Monocytes Absolute 0.6 0.1 - 1.0 K/uL   Eosinophils Relative 0 %   Eosinophils Absolute 0.0 0.0 - 0.5 K/uL   Basophils Relative 0 %   Basophils Absolute 0.0 0.0 - 0.1 K/uL   Immature Granulocytes 1 %   Abs Immature Granulocytes 0.05 0.00 - 0.07 K/uL    Comment:  Performed at [facility], [address] [place], [place] [address]  Troponin I (High Sensitivity)     Status: Abnormal   Collection Time: 07/27/24  4:54 AM  Result Value Ref Range   Troponin I (High Sensitivity) 19 (H) <18 ng/L    Comment: (NOTE) Elevated high sensitivity troponin I (hsTnI) values and significant  changes across serial measurements may suggest ACS but many other  chronic and acute conditions are known to elevate hsTnI results.  Refer to the Links section for chest pain algorithms and additional  guidance. Performed at [facility], [address] [place], [place] [address]   Magnesium      Status: Abnormal   Collection Time: 07/27/24  4:54 AM  Result Value Ref Range   Magnesium  1.5 (L) 1.7 - 2.4 mg/dL    Comment: Performed at [facility], [address] [place], [place] [address]  Troponin I (High Sensitivity)     Status: None   Collection Time: 07/27/24  6:55 AM  Result Value Ref Range   Troponin I (High Sensitivity) 14 <18 ng/L    Comment: (NOTE) Elevated high sensitivity troponin I (hsTnI) values and significant  changes across serial measurements may suggest ACS but many other  chronic and acute conditions are known to elevate hsTnI results.  Refer to the Links section for chest pain algorithms and additional  guidance. Performed at [facility], [address] [place], [place] [address]   HIV Antibody (routine testing w rflx)     Status: None   Collection Time: 07/28/24  6:02 AM  Result Value Ref Range   HIV Screen 4th Generation wRfx Non Reactive Non Reactive    Comment: Performed at [facility], [address], [place], [place] [address]  Comprehensive metabolic panel  Status: Abnormal   Collection Time: 07/28/24  6:02 AM  Result Value Ref Range   Sodium 138 135 - 145 mmol/L   Potassium 4.4 3.5 - 5.1 mmol/L   Chloride 104 98 - 111 mmol/L   CO2 27 22 - 32 mmol/L   Glucose, Bld 211 (H) 70 -  99 mg/dL    Comment: Glucose reference range applies only to samples taken after fasting for at least 8 hours.   BUN 23 8 - 23 mg/dL   Creatinine, Ser 8.97 (H) 0.44 - 1.00 mg/dL   Calcium  9.8 8.9 - 10.3 mg/dL   Total Protein 7.2 6.5 - 8.1 g/dL   Albumin 3.8 3.5 - 5.0 g/dL   AST 22 15 - 41 U/L   ALT 24 0 - 44 U/L   Alkaline Phosphatase 19 (L) 38 - 126 U/L   Total Bilirubin 0.8 0.0 - 1.2 mg/dL   GFR, Estimated >39 >39 mL/min    Comment: (NOTE) Calculated using the CKD-EPI Creatinine Equation (2021)    Anion gap 7 5 - 15    Comment: Performed at [facility], [address] [place], [place] [address]  CBC     Status: Abnormal   Collection Time: 07/28/24  6:02 AM  Result Value Ref Range   WBC 10.9 (H) 4.0 - 10.5 K/uL   RBC 3.64 (L) 3.87 - 5.11 MIL/uL   Hemoglobin 12.1 12.0 - 15.0 g/dL   HCT 63.3 63.9 - 53.9 %   MCV 100.5 (H) 80.0 - 100.0 fL   MCH 33.2 26.0 - 34.0 pg   MCHC 33.1 30.0 - 36.0 g/dL   RDW 86.1 88.4 - 84.4 %   Platelets 257 150 - 400 K/uL   nRBC 0.0 0.0 - 0.2 %    Comment: Performed at [facility], [address] [place], [place] [address]  Protime-INR     Status: None   Collection Time: 07/28/24  6:02 AM  Result Value Ref Range   Prothrombin Time 14.4 11.4 - 15.2 seconds   INR 1.1 0.8 - 1.2    Comment: (NOTE) INR goal varies based on device and disease states. Performed at [facility], [address] [place], [place] [address]   Comprehensive metabolic panel     Status: Abnormal   Collection Time: 08/03/24  3:33 PM  Result Value Ref Range   Sodium 138 135 - 145 mmol/L   Potassium 4.1 3.5 - 5.1 mmol/L   Chloride 95 (L) 98 - 111 mmol/L   CO2 31 22 - 32 mmol/L   Glucose, Bld 100 (H) 70 - 99 mg/dL    Comment: Glucose reference range applies only to samples taken after fasting for at least 8 hours.   BUN 15 8 - 23 mg/dL   Creatinine, Ser 9.06 0.44 - 1.00 mg/dL   Calcium  9.6 8.9 - 10.3 mg/dL   Total Protein 6.4 (L) 6.5 - 8.1  g/dL   Albumin 3.9 3.5 - 5.0 g/dL   AST 17 15 - 41 U/L   ALT 19 0 - 44 U/L   Alkaline Phosphatase 13 (L) 38 - 126 U/L   Total Bilirubin 0.9 0.0 - 1.2 mg/dL   GFR, Estimated >39 >39 mL/min    Comment: (NOTE) Calculated using the CKD-EPI Creatinine Equation (2021)    Anion gap 12 5 - 15    Comment: Performed at [facility], [address], [place], [place] [address]  Troponin I (High Sensitivity)  Status: None   Collection Time: 08/03/24  3:33 PM  Result Value Ref Range   Troponin I (High Sensitivity) 13 <18 ng/L    Comment: (NOTE) Elevated high sensitivity troponin I (hsTnI) values and significant  changes across serial measurements may suggest ACS but many other  chronic and acute conditions are known to elevate hsTnI results.  Refer to the Links section for chest pain algorithms and additional  guidance. Performed at [facility], [address] [place], [place] [address]   Lactic acid, plasma     Status: None   Collection Time: 08/03/24  3:33 PM  Result Value Ref Range   Lactic Acid, Venous 0.6 0.5 - 1.9 mmol/L    Comment: Performed at [facility], [address] [place], [place] [address]  CBC with Differential     Status: None   Collection Time: 08/03/24  3:33 PM  Result Value Ref Range   WBC 7.7 4.0 - 10.5 K/uL   RBC 3.98 3.87 - 5.11 MIL/uL   Hemoglobin 12.9 12.0 - 15.0 g/dL   HCT 60.9 63.9 - 53.9 %   MCV 98.0 80.0 - 100.0 fL   MCH 32.4 26.0 - 34.0 pg   MCHC 33.1 30.0 - 36.0 g/dL   RDW 86.6 88.4 - 84.4 %   Platelets 253 150 - 400 K/uL   nRBC 0.0 0.0 - 0.2 %   Neutrophils Relative % 74 %   Neutro Abs 5.7 1.7 - 7.7 K/uL   Lymphocytes Relative 18 %   Lymphs Abs 1.4 0.7 - 4.0 K/uL   Monocytes Relative 7 %   Monocytes Absolute 0.5 0.1 - 1.0 K/uL   Eosinophils Relative 1 %   Eosinophils Absolute 0.0 0.0 - 0.5 K/uL   Basophils Relative 0 %   Basophils Absolute 0.0 0.0 - 0.1 K/uL   Immature Granulocytes 0 %   Abs Immature  Granulocytes 0.02 0.00 - 0.07 K/uL    Comment: Performed at [facility], [address] [place], [place] [address]  Urinalysis, Routine w reflex microscopic -Urine, Clean Catch     Status: Abnormal   Collection Time: 08/03/24  3:33 PM  Result Value Ref Range   Color, Urine STRAW (A) YELLOW   APPearance CLEAR (A) CLEAR   Specific Gravity, Urine 1.005 1.005 - 1.030   pH 8.0 5.0 - 8.0   Glucose, UA NEGATIVE NEGATIVE mg/dL   Hgb urine dipstick NEGATIVE NEGATIVE   Bilirubin Urine NEGATIVE NEGATIVE   Ketones, ur NEGATIVE NEGATIVE mg/dL   Protein, ur NEGATIVE NEGATIVE mg/dL   Nitrite NEGATIVE NEGATIVE   Leukocytes,Ua LARGE (A) NEGATIVE   RBC / HPF 0-5 0 - 5 RBC/hpf   WBC, UA >50 0 - 5 WBC/hpf   Bacteria, UA MANY (A) NONE SEEN   Squamous Epithelial / HPF 0-5 0 - 5 /HPF   WBC Clumps PRESENT     Comment: Performed at [facility], [address] [place], [place] [address]  Resp panel by RT-PCR (RSV, Flu A&B, Covid) Anterior Nasal Swab     Status: None   Collection Time: 08/03/24  3:33 PM   Specimen: Anterior Nasal Swab  Result Value Ref Range   SARS Coronavirus 2 by RT PCR NEGATIVE NEGATIVE    Comment: (NOTE) SARS-CoV-2 target nucleic acids are NOT DETECTED.  The SARS-CoV-2 RNA is generally detectable in upper respiratory specimens during the acute phase of infection. The lowest concentration of SARS-CoV-2 viral copies this assay can detect is 138 copies/mL.  A negative result does not preclude SARS-Cov-2 infection and should not be used as the sole basis for treatment or other patient management decisions. A negative result may occur with  improper specimen collection/handling, submission of specimen other than nasopharyngeal swab, presence of viral mutation(Kerrie Latour) within the areas targeted by this assay, and inadequate number of viral copies(<138 copies/mL). A negative result must be combined with clinical observations, patient history, and  epidemiological information. The expected result is Negative.  Fact Sheet for Patients:  BloggerCourse.com  Fact Sheet for Healthcare Providers:  SeriousBroker.it  This test is no t yet approved or cleared by the [place]  FDA and  has been authorized for detection and/or diagnosis of SARS-CoV-2 by FDA under an Emergency Use Authorization (EUA). This EUA will remain  in effect (meaning this test can be used) for the duration of the COVID-19 declaration under Section 564(b)(1) of the Act, 21 U.Yuya Vanwinger[place].C.section 360bbb-3(b)(1), unless the authorization is terminated  or revoked sooner.       Influenza A by PCR NEGATIVE NEGATIVE   Influenza B by PCR NEGATIVE NEGATIVE    Comment: (NOTE) The Xpert Xpress SARS-CoV-2/FLU/RSV plus assay is intended as an aid in the diagnosis of influenza from Nasopharyngeal swab specimens and should not be used as a sole basis for treatment. Nasal washings and aspirates are unacceptable for Xpert Xpress SARS-CoV-2/FLU/RSV testing.  Fact Sheet for Patients: BloggerCourse.com  Fact Sheet for Healthcare Providers: SeriousBroker.it  This test is not yet approved or cleared by the [place]  FDA and has been authorized for detection and/or diagnosis of SARS-CoV-2 by FDA under an Emergency Use Authorization (EUA). This EUA will remain in effect (meaning this test can be used) for the duration of the COVID-19 declaration under Section 564(b)(1) of the Act, 21 U.Lacreshia Bondarenko.C. section 360bbb-3(b)(1), unless the authorization is terminated or revoked.     Resp Syncytial Virus by PCR NEGATIVE NEGATIVE    Comment: (NOTE) Fact Sheet for Patients: BloggerCourse.com  Fact Sheet for Healthcare Providers: SeriousBroker.it  This test is not yet approved or cleared by the [place]  FDA and has been authorized for  detection and/or diagnosis of SARS-CoV-2 by FDA under an Emergency Use Authorization (EUA). This EUA will remain in effect (meaning this test can be used) for the duration of the COVID-19 declaration under Section 564(b)(1) of the Act, 21 U.Deniro Laymon.C. section 360bbb-3(b)(1), unless the authorization is terminated or revoked.  Performed at [facility], [address] [place], [place] [address]   Lipase, blood     Status: None   Collection Time: 08/03/24  3:33 PM  Result Value Ref Range   Lipase 27 11 - 51 U/L    Comment: Performed at [facility], [address] [place], [place] [address]  Blood gas, venous     Status: Abnormal   Collection Time: 08/03/24  3:36 PM  Result Value Ref Range   pH, Ven 7.47 (H) 7.25 - 7.43   pCO2, Ven 54 44 - 60 mmHg   pO2, Ven 55 (H) 32 - 45 mmHg   Bicarbonate 39.3 (H) 20.0 - 28.0 mmol/L   Acid-Base Excess 13.3 (H) 0.0 - 2.0 mmol/L   O2 Saturation 89.3 %   Patient temperature 37.0    Collection site VEIN     Comment: Performed at [facility], [address] [place], [place] [address]      Assessment & Plan:  Naviah was seen today for follow-up.  Acute cystitis without hematuria -  POCT urinalysis dipstick; Future  Lumbar spondylosis -     Gabapentin ; Take 1 tablet (800 mg total) by mouth 3 (three) times daily.  Dispense: 270 tablet; Refill: 0  Mixed hyperlipidemia -     Fenofibrate  Micronized; Take 1 capsule (134 mg total) by mouth daily before breakfast.  Dispense: 90 capsule; Refill: 0 -     Lipid panel -     Hepatic function panel  Anxiety and depression -     busPIRone  HCl; Take 1 tablet (30 mg total) by mouth 2 (two) times daily.  Dispense: 180 tablet; Refill: 1  Prediabetes -     Hemoglobin A1c    Problem List Items Addressed This Visit       Musculoskeletal and Integument   Lumbar spondylosis   Relevant Medications   gabapentin  (NEURONTIN ) 800 MG tablet     Genitourinary   UTI (urinary  tract infection) - Primary   Relevant Orders   POCT Urinalysis Dipstick (18997)     Other   Anxiety and depression   Relevant Medications   busPIRone  (BUSPAR ) 30 MG tablet   Prediabetes   Relevant Orders   Hemoglobin A1c   Mixed hyperlipidemia   Relevant Medications   fenofibrate  micronized (LOFIBRA) 134 MG capsule   Other Relevant Orders   Lipid panel   Hepatic function panel    Return in about 3 months (around 11/07/2024) for fu with labs prior.   Total time spent: 20 minutes  Sherrill Cinderella Perry, MD  08/07/2024   This document may have been prepared by [facility] Voice Recognition software and as such may include unintentional dictation errors.

## 2024-08-09 DIAGNOSIS — S82832A Other fracture of upper and lower end of left fibula, initial encounter for closed fracture: Secondary | ICD-10-CM | POA: Diagnosis not present

## 2024-08-09 DIAGNOSIS — R2689 Other abnormalities of gait and mobility: Secondary | ICD-10-CM | POA: Diagnosis not present

## 2024-08-09 DIAGNOSIS — Z8781 Personal history of (healed) traumatic fracture: Secondary | ICD-10-CM | POA: Diagnosis not present

## 2024-08-10 ENCOUNTER — Encounter
Admission: RE | Admit: 2024-08-10 | Discharge: 2024-08-10 | Disposition: A | Discharge status: Home / Self Care | Source: Ambulatory Visit | Attending: Neurosurgery | Admitting: Neurosurgery

## 2024-08-10 DIAGNOSIS — M5412 Radiculopathy, cervical region: Secondary | ICD-10-CM

## 2024-08-10 DIAGNOSIS — Z01812 Encounter for preprocedural laboratory examination: Secondary | ICD-10-CM

## 2024-08-10 DIAGNOSIS — Z01818 Encounter for other preprocedural examination: Secondary | ICD-10-CM

## 2024-08-10 NOTE — Patient Instructions (Signed)
 Your procedure is scheduled on:08-17-24 Friday Report to the Registration Desk on the 1st floor of the [facility].Then proceed to the 2nd floor Surgery Desk To find out your arrival time, please call [phone] between 1PM - 3PM on:08-16-24 Thursday If your arrival time is 6:00 am, do not arrive before that time as the [facility] entrance doors do not open until 6:00 am.  REMEMBER: Instructions that are not followed completely may result in serious medical risk, up to and including death; or upon the discretion of your surgeon and anesthesiologist your surgery may need to be rescheduled.  Do not eat food after midnight the night before surgery.  No gum chewing or hard candies.  You may however, drink Water up to 2 hours before you are scheduled to arrive for your surgery. Do not drink anything within 2 hours of your scheduled arrival time.  One week prior to surgery: Stop ANY OVER THE COUNTER supplements until after surgery (Vitamin D3, Vitamin B12, Vitamin B6)  Continue taking all of your other prescription medications up until the day of surgery.  ON THE DAY OF SURGERY ONLY TAKE THESE MEDICATIONS WITH SIPS OF WATER: -busPIRone  (BUSPAR )  -cloNIDine  (CATAPRES )  -diltiazem  (CARDIZEM  CD)  -escitalopram  (LEXAPRO )  -gabapentin  (NEURONTIN )  -levothyroxine  (SYNTHROID )  -pantoprazole  (PROTONIX )  -pramipexole  (MIRAPEX )   Use your Albuterol  and Breztri Inhaler the day of surgery and bring your Albuterol  Inhaler to the hospital  No Alcohol  for 24 hours before or after surgery.  No Smoking including e-cigarettes for 24 hours before surgery.  No chewable tobacco products for at least 6 hours before surgery.  No nicotine  patches on the day of surgery.  Do not use any recreational drugs for at least a week (preferably 2 weeks) before your surgery.  Please be advised that the combination of cocaine and anesthesia may have negative outcomes, up to and including death. If you test positive  for cocaine, your surgery will be cancelled.  On the morning of surgery brush your teeth with toothpaste and water, you may rinse your mouth with mouthwash if you wish. Do not swallow any toothpaste or mouthwash.  Use CHG Soap as directed on instruction sheet.  Do not wear jewelry, make-up, hairpins, clips or nail polish.  For welded (permanent) jewelry: bracelets, anklets, waist bands, etc.  Please have this removed prior to surgery.  If it is not removed, there is a chance that hospital personnel will need to cut it off on the day of surgery.  Do not wear lotions, powders, or perfumes.   Do not shave body hair from the neck down 48 hours before surgery.  Contact lenses, hearing aids and dentures may not be worn into surgery.  Do not bring valuables to the hospital. [facility] is not responsible for any missing/lost belongings or valuables.   Bring your C-PAP to the hospital  Notify your doctor if there is any change in your medical condition (cold, fever, infection).  Wear comfortable clothing (specific to your surgery type) to the hospital.  After surgery, you can help prevent lung complications by doing breathing exercises.  Take deep breaths and cough every 1-2 hours. Your doctor may order a device called an Incentive Spirometer to help you take deep breaths. When coughing or sneezing, hold a pillow firmly against your incision with both hands. This is called "splinting." Doing this helps protect your incision. It also decreases belly discomfort.  If you are being admitted to the hospital overnight, leave your suitcase in  the car. After surgery it may be brought to your room.  In case of increased patient census, it may be necessary for you, the patient, to continue your postoperative care in the Same Day Surgery department.  If you are being discharged the day of surgery, you will not be allowed to drive home. You will need a responsible individual to drive you home and stay  with you for 24 hours after surgery.   If you are taking public transportation, you will need to have a responsible individual with you.  Please call the Pre-admissions Testing Dept. at [phone] if you have any questions about these instructions.  Surgery Visitation Policy:  Patients having surgery or a procedure may have two visitors.  Children under the age of 51 must have an adult with them who is not the patient.  Inpatient Visitation:    Visiting hours are 7 a.m. to 8 p.m. Up to four visitors are allowed at one time in a patient room. The visitors may rotate out with other people during the day.  One visitor age 73 or older may stay with the patient overnight and must be in the room by 8 p.m.    Pre-operative 5 CHG Bath Instructions   You can play a key role in reducing the risk of infection after surgery. Your skin needs to be as free of germs as possible. You can reduce the number of germs on your skin by washing with CHG (chlorhexidine  gluconate) soap before surgery. CHG is an antiseptic soap that kills germs and continues to kill germs even after washing.   DO NOT use if you have an allergy to chlorhexidine /CHG or antibacterial soaps. If your skin becomes reddened or irritated, stop using the CHG and notify one of our RNs at [phone].   Please shower with the CHG soap starting 4 days before surgery using the following schedule:     Please keep in mind the following:  DO NOT shave, including legs and underarms, starting the day of your first shower.   You may shave your face at any point before/day of surgery.  Place clean sheets on your bed the day you start using CHG soap. Use a clean washcloth (not used since being washed) for each shower. DO NOT sleep with pets once you start using the CHG.   CHG Shower Instructions:  If you choose to wash your hair and private area, wash first with your normal shampoo/soap.  After you use shampoo/soap, rinse your hair and  body thoroughly to remove shampoo/soap residue.  Turn the water OFF and apply about 3 tablespoons (45 ml) of CHG soap to a CLEAN washcloth.  Apply CHG soap ONLY FROM YOUR NECK DOWN TO YOUR TOES (washing for 3-5 minutes)  DO NOT use CHG soap on face, private areas, open wounds, or sores.  Pay special attention to the area where your surgery is being performed.  If you are having back surgery, having someone wash your back for you may be helpful. Wait 2 minutes after CHG soap is applied, then you may rinse off the CHG soap.  Pat dry with a clean towel  Put on clean clothes/pajamas   If you choose to wear lotion, please use ONLY the CHG-compatible lotions on the back of this paper.     Additional instructions for the day of surgery: DO NOT APPLY any lotions, deodorants, cologne, or perfumes.   Put on clean/comfortable clothes.  Brush your teeth.  Ask your nurse before  applying any prescription medications to the skin.      CHG Compatible Lotions   Aveeno Moisturizing lotion  Cetaphil Moisturizing Cream  Cetaphil Moisturizing Lotion  Clairol Herbal Essence Moisturizing Lotion, Dry Skin  Clairol Herbal Essence Moisturizing Lotion, Extra Dry Skin  Clairol Herbal Essence Moisturizing Lotion, Normal Skin  Curel Age Defying Therapeutic Moisturizing Lotion with Alpha Hydroxy  Curel Extreme Care Body Lotion  Curel Soothing Hands Moisturizing Hand Lotion  Curel Therapeutic Moisturizing Cream, Fragrance-Free  Curel Therapeutic Moisturizing Lotion, Fragrance-Free  Curel Therapeutic Moisturizing Lotion, Original Formula  Eucerin Daily Replenishing Lotion  Eucerin Dry Skin Therapy Plus Alpha Hydroxy Crme  Eucerin Dry Skin Therapy Plus Alpha Hydroxy Lotion  Eucerin Original Crme  Eucerin Original Lotion  Eucerin Plus Crme Eucerin Plus Lotion  Eucerin TriLipid Replenishing Lotion  Keri Anti-Bacterial Hand Lotion  Keri Deep Conditioning Original Lotion Dry Skin Formula Softly Scented   Keri Deep Conditioning Original Lotion, Fragrance Free Sensitive Skin Formula  Keri Lotion Fast Absorbing Fragrance Free Sensitive Skin Formula  Keri Lotion Fast Absorbing Softly Scented Dry Skin Formula  Keri Original Lotion  Keri Skin Renewal Lotion Keri Silky Smooth Lotion  Keri Silky Smooth Sensitive Skin Lotion  Nivea Body Creamy Conditioning Oil  Nivea Body Extra Enriched Teacher, adult education Moisturizing Lotion Nivea Crme  Nivea Skin Firming Lotion  NutraDerm 30 Skin Lotion  NutraDerm Skin Lotion  NutraDerm Therapeutic Skin Cream  NutraDerm Therapeutic Skin Lotion  ProShield Protective Hand Cream  Provon moisturizing lotion   Merchandiser, retail to address health-related social needs:  [email or web address]

## 2024-08-12 DIAGNOSIS — R2689 Other abnormalities of gait and mobility: Secondary | ICD-10-CM | POA: Diagnosis not present

## 2024-08-12 DIAGNOSIS — N189 Chronic kidney disease, unspecified: Secondary | ICD-10-CM | POA: Diagnosis not present

## 2024-08-12 DIAGNOSIS — S6290XS Unspecified fracture of unspecified wrist and hand, sequela: Secondary | ICD-10-CM | POA: Diagnosis not present

## 2024-08-13 ENCOUNTER — Other Ambulatory Visit: Payer: Self-pay | Admitting: Internal Medicine

## 2024-08-13 DIAGNOSIS — M47816 Spondylosis without myelopathy or radiculopathy, lumbar region: Secondary | ICD-10-CM

## 2024-08-14 ENCOUNTER — Other Ambulatory Visit: Payer: Self-pay | Admitting: Internal Medicine

## 2024-08-14 ENCOUNTER — Telehealth: Payer: Self-pay

## 2024-08-14 ENCOUNTER — Other Ambulatory Visit: Payer: Self-pay | Admitting: Physician Assistant

## 2024-08-14 DIAGNOSIS — R2689 Other abnormalities of gait and mobility: Secondary | ICD-10-CM | POA: Diagnosis not present

## 2024-08-14 DIAGNOSIS — I10 Essential (primary) hypertension: Secondary | ICD-10-CM

## 2024-08-14 DIAGNOSIS — N189 Chronic kidney disease, unspecified: Secondary | ICD-10-CM | POA: Diagnosis not present

## 2024-08-14 MED ORDER — OXYCODONE HCL 5 MG PO TABS: 5.0000 mg | ORAL_TABLET | Freq: Four times a day (QID) | ORAL | 0 refills | Status: DC | PRN

## 2024-08-14 NOTE — Telephone Encounter (Signed)
 I spoke with [name]. She reports that both hands are severely numb and she can hardly use them. It runs up her arms and into her shoulders. She has pain in the back of her neck.She is also having low back pain and bilateral hip pain.  She is currently taking tylenol  arthritis and gabapentin  800mg  TID.  We discussed that if she feels she cannot wait until her surgery on Friday, she should proceed to the ER.  She would like to know if we can send in something else to help with the pain to hold her over until surgery.   [facility]

## 2024-08-14 NOTE — Telephone Encounter (Signed)
 I spoke with [name]. She reports that [name] called her worried because she has increased numbness in both arms and hands and pain in her neck and back. I informed her that I will give the patient a call.

## 2024-08-14 NOTE — Telephone Encounter (Signed)
 I notified the patient .

## 2024-08-15 ENCOUNTER — Telehealth: Payer: Self-pay | Admitting: Neurosurgery

## 2024-08-15 ENCOUNTER — Encounter
Admission: RE | Admit: 2024-08-15 | Discharge: 2024-08-15 | Disposition: A | Discharge status: Home / Self Care | Source: Ambulatory Visit | Attending: Neurosurgery | Admitting: Neurosurgery

## 2024-08-15 ENCOUNTER — Other Ambulatory Visit: Payer: Self-pay | Admitting: Physician Assistant

## 2024-08-15 ENCOUNTER — Ambulatory Visit (INDEPENDENT_AMBULATORY_CARE_PROVIDER_SITE_OTHER): Admitting: Internal Medicine

## 2024-08-15 DIAGNOSIS — Z01812 Encounter for preprocedural laboratory examination: Secondary | ICD-10-CM | POA: Diagnosis not present

## 2024-08-15 DIAGNOSIS — Z01818 Encounter for other preprocedural examination: Secondary | ICD-10-CM

## 2024-08-15 DIAGNOSIS — N390 Urinary tract infection, site not specified: Secondary | ICD-10-CM

## 2024-08-15 LAB — POCT URINALYSIS DIPSTICK
Bilirubin, UA: NEGATIVE
Glucose, UA: NEGATIVE
Ketones, UA: NEGATIVE
Leukocytes, UA: NEGATIVE
Nitrite, UA: NEGATIVE
Protein, UA: NEGATIVE
Spec Grav, UA: 1.01 (ref 1.010–1.025)
Urobilinogen, UA: 0.2 U/dL
pH, UA: 6 (ref 5.0–8.0)

## 2024-08-15 MED ORDER — METHOCARBAMOL 500 MG PO TABS: 500.0000 mg | ORAL_TABLET | Freq: Three times a day (TID) | ORAL | 1 refills | Status: DC

## 2024-08-15 NOTE — Telephone Encounter (Signed)
 Patient left a voicemail during lunch stating that the Oxycodone  5mg  is not helping. She would like to know if she can take two. Please advise.

## 2024-08-15 NOTE — Telephone Encounter (Signed)
 Left detailed message on pt's voicemail of [name]'s response. Advised that muscle relaxers can make her sleepy and the limit the use of pain medication as much as possible prior to surgery.  Rx for methocarbamol  was erroneously sent to mail order pharmacy. I called [facility] mail order pharmacy to cancel this and resent the rx to [facility].

## 2024-08-16 ENCOUNTER — Encounter: Payer: Self-pay | Admitting: Neurosurgery

## 2024-08-16 ENCOUNTER — Other Ambulatory Visit: Payer: Self-pay | Admitting: Internal Medicine

## 2024-08-16 DIAGNOSIS — F32A Depression, unspecified: Secondary | ICD-10-CM

## 2024-08-16 NOTE — Telephone Encounter (Signed)
 I spoke with the patient. She states she received my voicemail yesterday and does not have any questions at this time.

## 2024-08-16 NOTE — Progress Notes (Addendum)
 Perioperative / Anesthesia Services  Pre-Admission Testing Clinical Review / Pre-Operative Anesthesia Consult  Date: 08/16/24  PATIENT DEMOGRAPHICS: Name: [name] DOB: 1956/07/17 MRN:   [id number]  Note: Available PAT nursing documentation and vital signs have been reviewed. Clinical nursing staff has updated patient's PMH/PSHx, current medication list, and drug allergies/intolerances to ensure complete and comprehensive history available to assist care teams in MDM as it pertains to the aforementioned surgical procedure and anticipated anesthetic course. Extensive review of available clinical information personally performed. [place] PMH and PSHx updated with any diagnoses/procedures that  may have been inadvertently omitted during her intake with the pre-admission testing department's nursing staff.  PLANNED SURGICAL PROCEDURE(S):   Case: [id number] Date/Time: 08/17/24 0905   Procedure: ANTERIOR CERVICAL DECOMPRESSION/DISCECTOMY FUSION 2 LEVELS - C3-4 & C6-7 ANTERIOR CERVICAL DISCECTOMY AND FUSION   Anesthesia type: General   Diagnosis:      Cervical myelopathy (HCC) [G95.9]     Spinal stenosis of cervical region [M48.02]   Pre-op diagnosis:      G95.9 Cervical myelopathy     M48.02 Spinal stenosis of cervical region   Location: [facility] OR ROOM 03 / [facility]   Surgeons: Clois Fret, MD        CLINICAL DISCUSSION: [name] is a 68 y.o. female who is submitted for pre-surgical anesthesia review and clearance prior to her undergoing the above procedure. Patient is a Current Smoker (100 pack years). Pertinent PMH includes: CAD, HFpEF, pulmonary hypertension, PVD, aortic atherosclerosis, angina, HTN, HLD, T2DM, hypothyroidism, CKD-III, COPD (on supplemental oxygen ), OSAH (requires nocturnal PAP therapy), GERD (on daily PPI), anemia, seizures (in setting of narcotic withdrawal), OA, chronic pain syndrome, cervical stenosis with myelopathy, thoracic  DDD, lumbar spondylosis, RLS, depression, anxiety.  Patient is followed by cardiology Philippe, MD). She was last seen in the cardiology clinic on 04/16/2024; notes reviewed. At the time of her clinic visit, patient doing well overall from a cardiovascular perspective. Patient denied any chest pain, shortness of breath, PND, orthopnea, palpitations, significant peripheral edema, weakness, fatigue, vertiginous symptoms, or presyncope/syncope. Patient with a past medical history significant for cardiovascular diagnoses. Documented physical exam was grossly benign, providing no evidence of acute exacerbation and/or decompensation of the patient's known cardiovascular conditions.  Most recent myocardial perfusion imaging study was performed on 08/08/2018 revealing a normal left ventricular systolic function with an EF of 55-65%.  There were no regional wall motion abnormalities.  No artifact or left ventricular cavity size enlargement appreciated on review of imaging. SPECT images demonstrated a small defect of mild severity present in the mid anteroseptal location. There was no evidence os reversible ischemia. TID ratio = 1.17. Study determined to be normal and low risk.  Most recent TTE performed on 11/18/2020 revealed a normal left ventricular systolic function with an EF of 60-65%. There were no regional wall motion abnormalities. Left ventricular diastolic Doppler parameters consistent with pseudonormalization (G2DD).  There was severe biatrial enlargement.  Right ventricular size and function normal with a TAPSE measuring 3.2 cm  (normal range >/= 1.6 cm).  RVSP = 38 mmHg consistent with patient's known pulmonary hypertension. There was mild mitral annular calcification and aortic valve sclerosis. There was mild mitral and tricuspid valve regurgitation.  All transvalvular gradients were noted to be normal providing no evidence of hemodynamically significant valvular stenosis. Aorta normal in size with no  evidence of ectasia or aneurysmal dilatation.  Coronary CTA was performed on 10/12/2023 that demonstrated an Agatston coronary artery calcium   score of 726. This placed patient in the 97th percentile for age, sex, and race matched controls. Calcium  depositions noted to be isolated mainly in the proximal RCA (25-49%) as well as the proximal LAD and LCx (<25%) distributions.  Study demonstrated normal coronary origin with RIGHT dominance.  Blood pressure grossly elevated at 192/115 mmHg on currently prescribed alpha-blocker (clonidine ), CCB (diltiazem ), vasodilator (hydralazine  + minoxidil ), and ARB (olmesartan ) therapies.  Of note, patient had not taken her medication prior to that visit.  Reports that blood pressure is normally better controlled at home.  Patient is on fenofibrate  therapy for her HLD diagnosis and further ASCVD prevention.  T2DM well-controlled with diet lifestyle modification alone.  Most recent hemoglobin A1c was 5.4% when checked on 10/11/2023.  Patient does have an OSAH diagnosis and is reported to be compliant with prescribed nocturnal PAP therapy.  Functional capacity is somewhat limited by patient's age, orthopedic pain, and multiple medical comorbidities.  With that said, patient able to complete all her ADLs/IADLs without significant cardiovascular limitation.  Per the [facility], patient is able to exceed 4 METS physical activity without experiencing any significant degree of angina/anginal equivalent symptoms.  No changes were made to her medication regimen.  Patient to follow-up with outpatient cardiology in 6 months or sooner if needed.  [name] is scheduled for an elective ANTERIOR CERVICAL DECOMPRESSION/DISCECTOMY FUSION 2 LEVELS on 08/16/2024 with Dr. Reeves Nine, MD.  Given patient's past medical history significant for cardiovascular and cardiopulmonary diagnoses, presurgical clearances were sought from cardiology and pulmonary medicine by the PAT team.   Per  pulmonary medicine, from a pulmonary perspective, patient is cleared to proceed with cervical fusion surgery.  She has stage II COPD.  She would be at a moderate risk for significant perioperative cardiopulmonary complications.  Per cardiology, this patient is optimized for surgery and may proceed with the planned procedural course with a LOW risk of significant perioperative cardiovascular complications.  In review of her medication reconciliation, the patient is not noted to be taking any type of anticoagulation or antiplatelet therapies that would need to be held during her perioperative course.  Patient denies previous perioperative complications with anesthesia in the past. In review her EMR, it is noted that patient underwent a general anesthetic course here at [facility] in 05/2024 without documented complications.   MOST RECENT VITAL SIGNS:    08/07/2024    1:12 PM 08/03/2024    7:33 PM 08/03/2024    5:00 PM  Vitals with BMI  Height 5' 6    Weight 161 lbs    BMI 26    Systolic 134 129 847  Diastolic 80 62 67  Pulse 79 82 75   PROVIDERS/SPECIALISTS: NOTE: Primary physician provider listed below. Patient may have been seen by APP or partner within same practice.   PROVIDER ROLE / SPECIALTY [facility] Clois Reeves, MD Neurosurgery (Surgeon) 07/03/2024  Albina GORMAN Dine, MD Primary Care Provider 08/07/2024  Florencio Shine, MD Cardiology 04/16/2024  Theotis Chancellor, MD Pulmonary Medicine 08/06/2024  Marea Mayo, MD Vascular Surgery 05/02/2024   ALLERGIES: Allergies  Allergen Reactions   Nsaids Other (See Comments)    Pt states it messes up her kidneys But takes aspirin  & ibuprofen    Thiazide-Type Diuretics     Other reaction(s): Unknown    CURRENT HOME MEDICATIONS: No current facility-administered medications for this encounter.    acetaminophen  (TYLENOL ) 650 MG CR tablet   albuterol  (VENTOLIN  HFA) 108 (90  Base)  MCG/ACT inhaler   budesonide -glycopyrrolate -formoterol  (BREZTRI ) 160-9-4.8 MCG/ACT AERO inhaler   busPIRone  (BUSPAR ) 30 MG tablet   Cholecalciferol  (VITAMIN D -3) 25 MCG (1000 UT) CAPS   cloNIDine  (CATAPRES ) 0.2 MG tablet   cyanocobalamin  (VITAMIN B12) 1000 MCG tablet   diltiazem  (CARDIZEM  CD) 180 MG 24 hr capsule   escitalopram  (LEXAPRO ) 20 MG tablet   fenofibrate  micronized (LOFIBRA) 134 MG capsule   fluticasone  (FLONASE ) 50 MCG/ACT nasal spray   levothyroxine  (SYNTHROID ) 25 MCG tablet   lidocaine -prilocaine (EMLA) cream   minoxidil  (LONITEN ) 10 MG tablet   naphazoline-glycerin  (CLEAR EYES REDNESS) 0.012-0.25 % SOLN   OXYGEN    pantoprazole  (PROTONIX ) 40 MG tablet   pramipexole  (MIRAPEX ) 0.25 MG tablet   pyridOXINE (VITAMIN B6) 100 MG tablet   gabapentin  (NEURONTIN ) 800 MG tablet   methocarbamol  (ROBAXIN ) 500 MG tablet   NON FORMULARY   olmesartan  (BENICAR ) 40 MG tablet   oxyCODONE  (ROXICODONE ) 5 MG immediate release tablet    acetaminophen  (TYLENOL ) tablet 1,000 mg   ceFAZolin  (ANCEF ) IVPB 2g/100 mL premix   chlorhexidine  (HIBICLENS ) 4 % liquid 4 application   chlorhexidine  (HIBICLENS ) 4 % liquid 4 application   chlorhexidine  (PERIDEX ) 0.12 % solution 15 mL   Or   Oral care mouth rinse   HISTORY: Past Medical History:  Diagnosis Date   (HFpEF) heart failure with preserved ejection fraction (HCC)    Anxiety    Aortic atherosclerosis (HCC)    Arthritis    Bilateral hand numbness    CAD (coronary artery disease)    Cervical myelopathy (HCC)    Cervical spinal stenosis    Chest pain at rest    Chronic pain syndrome    Chronic respiratory failure with hypoxia and hypercapnia (HCC)    CKD stage 3b, GFR 30-44 ml/min (HCC)    Community acquired pneumonia 01/10/2013   COPD (chronic obstructive pulmonary disease) (HCC)    DDD (degenerative disc disease), thoracic    Dependence on supplemental oxygen  (3L/[place] qhs PRN)    Depression    DM (diabetes mellitus), type 2 (HCC)     Dyspnea    Failed back surgical syndrome    GERD (gastroesophageal reflux disease)    Hyperlipidemia    Hypertension    Hypothyroid    Lumbar spondylosis    a.) s/p lumbar laminectomy, microdiscectomy, and L4-L5 fusion   Neuropathy    Nicotine  dependence    Normocytic anemia    OSA on CPAP    Pancreatitis 03/2024   Peripheral vascular disease (HCC)    Pulmonary hypertension (HCC)    Recurrent UTI (urinary tract infection)    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  no seizure since--pt does take keppra  and office note from neurologist dr. margaret on this chart   Sepsis due to pneumonia [facility])    Sinus tachycardia    Tobacco abuse    Urinary incontinence    Wound of left leg    being seen at wound clinic as of 03-23-24   Past Surgical History:  Procedure Laterality Date   CARPAL TUNNEL RELEASE Left    COLONOSCOPY WITH PROPOFOL  N/A 06/17/2022   Procedure: COLONOSCOPY WITH PROPOFOL ;  Surgeon: Maryruth Ole DASEN, MD;  Location: [facility] ENDOSCOPY;  Service: Endoscopy;  Laterality: N/A;   JOINT REPLACEMENT  08/26/2011   right total knee arthroplasty, left total knee   KNEE ARTHROPLASTY  10/28/2011   Procedure: COMPUTER ASSISTED TOTAL KNEE ARTHROPLASTY;  Surgeon: Norleen CROME Rendall III;  Location: [facility] ORS;  Service: Orthopedics;  Laterality: Left;  preop femoral nerve block   LAMINECTOMY AND MICRODISCECTOMY LUMBAR SPINE N/A    Procedure: LUMBAR LAMINECTOMY, MICRODISCECTOMY, AND L4-L5 FUSION   OPEN REDUCTION INTERNAL FIXATION (ORIF) METACARPAL Left 01/30/2024   Procedure: OPEN REDUCTION INTERNAL FIXATION (ORIF) OF LEFT 5TH METACARPAL SHAFT FRACTURE.;  Surgeon: Edie Norleen PARAS, MD;  Location: [facility] ORS;  Service: Orthopedics;   Laterality: Left;   PROCTOSCOPY N/A 06/18/2022   Procedure: RIGID PROCTOSCOPY;  Surgeon: Sheldon Standing, MD;  Location: [facility] ORS;  Service: General;  Laterality: N/A;   Family History  Problem Relation Age of Onset   Heart attack Father 65   Alzheimer's disease Mother    Breast cancer Neg Hx    Social History   Tobacco Use   Smoking status: Every Day    Current packs/day: 2.00    Average packs/day: 2.0 packs/day for 50.0 years (100.0 ttl pk-yrs)    Types: Cigarettes    Passive exposure: Current   Smokeless tobacco: Never  Substance Use Topics   Alcohol  use: Yes    Comment: rare   LABS:  Hospital Outpatient Visit on 08/15/2024  Component Date Value Ref Range Status   ABO/RH(D) 08/15/2024 A POS   Final   Antibody Screen 08/15/2024 POS   Final   Sample Expiration 08/15/2024 08/29/2024,2359   Final   Extend sample reason 08/15/2024 NO TRANSFUSIONS OR PREGNANCY IN THE PAST 3 MONTHS   Final   Antibody Identification 08/15/2024    Final                   Value:ANTI K Performed at [facility], [address], [place], [place] [address]   Office Visit on 08/15/2024  Component Date Value Ref Range Status   Color, UA 08/15/2024 light yellow   Final   Clarity, UA 08/15/2024 clear   Final   Glucose, UA 08/15/2024 Negative  Negative Final   Bilirubin, UA 08/15/2024 negative   Final   Ketones, UA 08/15/2024 negative   Final   Spec Grav, UA 08/15/2024 1.010  1.010 - 1.025 Final   Blood, UA 08/15/2024 2+   Final   pH, UA 08/15/2024 6.0  5.0 - 8.0 Final   Protein, UA 08/15/2024 Negative  Negative Final   Urobilinogen, UA 08/15/2024 0.2  0.2 or 1.0 E.U./dL Final   Nitrite, UA 90/96/7974 negative   Final   Leukocytes, UA 08/15/2024 Negative  Negative Final  Admission on 08/03/2024, Discharged on 08/03/2024  Component Date Value Ref Range Status   Sodium 08/03/2024 138  135 - 145 mmol/L Final   Potassium 08/03/2024 4.1  3.5 - 5.1 mmol/L Final   Chloride 08/03/2024 95 (L)  98 -  111 mmol/L Final   CO2 08/03/2024 31  22 - 32 mmol/L Final   Glucose, Bld 08/03/2024 100 (H)  70 - 99 mg/dL Final   Glucose reference range applies only to samples taken after fasting for at least 8 hours.   BUN 08/03/2024 15  8 - 23 mg/dL Final   Creatinine, Ser 08/03/2024 0.93  0.44 - 1.00 mg/dL Final   Calcium  08/03/2024 9.6  8.9 - 10.3 mg/dL Final   Total Protein 91/77/7974 6.4 (L)  6.5 -  8.1 g/dL Final   Albumin 91/77/7974 3.9  3.5 - 5.0 g/dL Final   AST 91/77/7974 17  15 - 41 U/L Final   ALT 08/03/2024 19  0 - 44 U/L Final   Alkaline Phosphatase 08/03/2024 13 (L)  38 - 126 U/L Final   Total Bilirubin 08/03/2024 0.9  0.0 - 1.2 mg/dL Final   GFR, Estimated 08/03/2024 >60  >60 mL/min Final   Comment: (NOTE) Calculated using the CKD-EPI Creatinine Equation (2021)    Anion gap 08/03/2024 12  5 - 15 Final   Performed at [facility], [address] [place], [place] [address]   Troponin I (High Sensitivity) 08/03/2024 13  <18 ng/L Final   Comment: (NOTE) Elevated high sensitivity troponin I (hsTnI) values and significant  changes across serial measurements may suggest ACS but many other  chronic and acute conditions are known to elevate hsTnI results.  Refer to the Links section for chest pain algorithms and additional  guidance. Performed at [facility], [address] [place], [place] [address]    Lactic Acid, Venous 08/03/2024 0.6  0.5 - 1.9 mmol/L Final   Performed at [facility], [address] [place], [place] [address]   WBC 08/03/2024 7.7  4.0 - 10.5 K/uL Final   RBC 08/03/2024 3.98  3.87 - 5.11 MIL/uL Final   Hemoglobin 08/03/2024 12.9  12.0 - 15.0 g/dL Final   HCT 91/77/7974 39.0  36.0 - 46.0 % Final   MCV 08/03/2024 98.0  80.0 - 100.0 fL Final   MCH 08/03/2024 32.4  26.0 - 34.0 pg Final   MCHC 08/03/2024 33.1  30.0 - 36.0 g/dL Final   RDW 91/77/7974 13.3  11.5 - 15.5 % Final   Platelets 08/03/2024 253  150 - 400 K/uL Final   nRBC  08/03/2024 0.0  0.0 - 0.2 % Final   Neutrophils Relative % 08/03/2024 74  % Final   Neutro Abs 08/03/2024 5.7  1.7 - 7.7 K/uL Final   Lymphocytes Relative 08/03/2024 18  % Final   Lymphs Abs 08/03/2024 1.4  0.7 - 4.0 K/uL Final   Monocytes Relative 08/03/2024 7  % Final   Monocytes Absolute 08/03/2024 0.5  0.1 - 1.0 K/uL Final   Eosinophils Relative 08/03/2024 1  % Final   Eosinophils Absolute 08/03/2024 0.0  0.0 - 0.5 K/uL Final   Basophils Relative 08/03/2024 0  % Final   Basophils Absolute 08/03/2024 0.0  0.0 - 0.1 K/uL Final   Immature Granulocytes 08/03/2024 0  % Final   Abs Immature Granulocytes 08/03/2024 0.02  0.00 - 0.07 K/uL Final    ECG: Date: 08/03/2024 Time ECG obtained: 1527  PM Rate: 73 bpm Rhythm: normal sinus Axis (leads I and aVF): normal Intervals: PR 157 ms. QRS 87 ms. QTc 456 ms. ST segment and T wave changes: No evidence of acute T wave abnormalities or significant ST segment elevation or depression.  Evidence of a possible, age undetermined, prior infarct:  No Comparison: Similar to previous tracing obtained on 07/27/2024   IMAGING / PROCEDURES: MR CERVICAL SPINE WO CONTRAST performed on 05/21/2024 Advanced cervical disc and facet degeneration with severe spinal stenosis at C3-4 and C6-7 and moderate spinal stenosis at C4-5 and C5-6. Associated T2 hyperintensity/edema in the spinal cord at C3-4. Severe multilevel neural foraminal stenosis as above. Prominent left facet edema at C3-4 which may be a source of neck pain.  VAS US  ABI WITH/WO TBI performed on 05/02/2024 Resting right ankle-brachial index is  within normal range. The right toe-brachial index is normal.  Resting left ankle-brachial index is within normal range. The left toe-brachial index is normal.    CT CHEST LUNG CA SCREEN LOW DOSE W/O CM performed on 03/06/2024 Lung-RADS 2, benign appearance or behavior. Continue annual screening with low-dose chest CT without contrast in 12 months. Aortic  atherosclerosis  Coronary artery calcification. Enlarged pulmonic trunk, indicative of pulmonary arterial hypertension. Emphysema  CT ABDOMEN PELVIS W CONTRAST performed on 03/28/2024 Significant phlegm a tori stranding surrounding the pancreas with some pancreatic edema. Please correlate with any clinical findings of pancreatitis. No well-defined fluid collections.  Splenic vein is patent. Mild biliary duct ectasia with distended gallbladder. Further workup when appropriate. Small amount of ascites.  CT CORONARY MORPH W/CTA COR W/SCORE W/CA W/CM &/OR WO/CM performed on 10/12/2023 Coronary calcium  score of 726. This was 97th percentile for age and sex matched control. Normal coronary origin with right dominance. Mild proximal RCA stenosis (25-49%). Minimal proximal LAD and LCx stenosis (<25%). CAD-RADS 2. Mild non-obstructive CAD (25-49%). Consider non-atherosclerotic causes of chest pain. Consider preventive therapy and risk factor modification.  VAS US  CAROTID performed on 07/15/2023 There was no evidence of thrombus, dissection, atherosclerotic plaque or stenosis in the cervical carotid system.  There was no evidence of thrombus, dissection, atherosclerotic plaque or stenosis in the cervical carotid system.  Bilateral vertebral arteries demonstrate antegrade flow.  Normal flow hemodynamics were seen in bilateral subclavian arteries.   TRANSTHORACIC ECHOCARDIOGRAM performed on 11/18/2020 Left ventricular ejection fraction, by estimation, is 60 to 65%. The left ventricle has normal function. The left ventricle has no regional wall motion abnormalities. Left ventricular diastolic parameters are consistent with Grade II diastolic dysfunction (pseudonormalization). Elevated left atrial pressure.  Right ventricular systolic function is normal. The right ventricular size is normal. There is mildly elevated pulmonary artery systolic pressure.  Left atrial size was severely dilated.  Right  atrial size was severely dilated.  The mitral valve is normal in structure. Mild mitral valve regurgitation.  The aortic valve is normal in structure. Aortic valve regurgitation is not visualized. Mild aortic valve sclerosis is present, with no evidence of aortic valve stenosis.  The inferior vena cava is dilated in size with <50% respiratory variability, suggesting right atrial pressure of 15 mmHg.   MYOCARDIAL PERFUSION IMAGING STUDY (LEXISCAN ) performed on 08/08/2018 The left ventricular ejection fraction is normal (55-65%). Nuclear stress EF: 64%. There was no ST segment deviation noted during stress. There is a small defect of mild severity present in the mid anteroseptal location. The study is normal and low risk.    IMPRESSION AND PLAN: METTA KORANDA has been referred for pre-anesthesia review and clearance prior to her undergoing the planned anesthetic and procedural courses. Available labs, pertinent testing, and imaging results were personally reviewed by me in preparation for upcoming operative/procedural course. [facility] medical record has been updated following extensive record review and patient interview with PAT staff.   This patient has been appropriately cleared by cardiology (LOW) and by pulmonary medicine (MODERATE) with the individually indicated risk of patient experiencing significant perioperative cardiovascular/cardiopulmonary complications. Based on clinical review performed today (08/16/24), barring any significant acute changes in the patient's overall condition, it is anticipated that she will be able to proceed with the planned surgical intervention. Any acute changes in clinical condition may necessitate her procedure being postponed and/or cancelled. Patient will meet with anesthesia team (MD and/or CRNA) on the day of her procedure for preoperative evaluation/assessment. Questions regarding  anesthetic course will be fielded at that time.   Pre-surgical  instructions were reviewed with the patient during his PAT appointment, and questions were fielded to satisfaction by PAT clinical staff. She has been instructed on which medications that she will need to hold prior to surgery, as well as the ones that have been deemed safe/appropriate to take on the day of her procedure. As part of the general education provided by PAT, patient made aware both verbally and in writing, that she would need to abstain from the use of any illegal substances during her perioperative course. She was advised that failure to follow the provided instructions could necessitate case cancellation or result in serious perioperative complications up to and including death. Patient encouraged to contact PAT and/or her surgeon's office to discuss any questions or concerns that may arise prior to surgery; verbalized understanding.   Dorise Pereyra, MSN, APRN, FNP-C, CEN [facility]  Perioperative Services Nurse Practitioner Phone: [phone] Fax: [phone] 08/16/24 1:07 PM  NOTE: This note has been prepared using Dragon dictation software. Despite my best ability to proofread, there is always the potential that unintentional transcriptional errors may still occur from this process.

## 2024-08-16 NOTE — Telephone Encounter (Signed)
 Patient left a message with the answering service that she was returning your call.

## 2024-08-17 ENCOUNTER — Other Ambulatory Visit: Payer: Self-pay

## 2024-08-17 ENCOUNTER — Encounter: Admitting: Urgent Care

## 2024-08-17 ENCOUNTER — Encounter
Admission: RE | Disposition: A | Discharge status: Home / Self Care | Payer: Self-pay | Source: Home / Self Care | Attending: Neurosurgery

## 2024-08-17 ENCOUNTER — Ambulatory Visit

## 2024-08-17 ENCOUNTER — Ambulatory Visit: Admitting: Urgent Care

## 2024-08-17 ENCOUNTER — Observation Stay
Admission: RE | Admit: 2024-08-17 | Discharge: 2024-08-19 | Disposition: A | Discharge status: Home / Self Care | Attending: Neurosurgery | Admitting: Neurosurgery

## 2024-08-17 ENCOUNTER — Encounter: Payer: Self-pay | Admitting: Neurosurgery

## 2024-08-17 DIAGNOSIS — E039 Hypothyroidism, unspecified: Secondary | ICD-10-CM | POA: Insufficient documentation

## 2024-08-17 DIAGNOSIS — G4733 Obstructive sleep apnea (adult) (pediatric): Secondary | ICD-10-CM | POA: Diagnosis not present

## 2024-08-17 DIAGNOSIS — I2510 Atherosclerotic heart disease of native coronary artery without angina pectoris: Secondary | ICD-10-CM | POA: Insufficient documentation

## 2024-08-17 DIAGNOSIS — N1832 Chronic kidney disease, stage 3b: Secondary | ICD-10-CM | POA: Insufficient documentation

## 2024-08-17 DIAGNOSIS — M4322 Fusion of spine, cervical region: Secondary | ICD-10-CM | POA: Diagnosis not present

## 2024-08-17 DIAGNOSIS — M4802 Spinal stenosis, cervical region: Principal | ICD-10-CM

## 2024-08-17 DIAGNOSIS — Z79899 Other long term (current) drug therapy: Secondary | ICD-10-CM | POA: Insufficient documentation

## 2024-08-17 DIAGNOSIS — J449 Chronic obstructive pulmonary disease, unspecified: Secondary | ICD-10-CM | POA: Diagnosis not present

## 2024-08-17 DIAGNOSIS — I5032 Chronic diastolic (congestive) heart failure: Secondary | ICD-10-CM | POA: Diagnosis not present

## 2024-08-17 DIAGNOSIS — E1122 Type 2 diabetes mellitus with diabetic chronic kidney disease: Secondary | ICD-10-CM | POA: Insufficient documentation

## 2024-08-17 DIAGNOSIS — F1090 Alcohol use, unspecified, uncomplicated: Secondary | ICD-10-CM | POA: Insufficient documentation

## 2024-08-17 DIAGNOSIS — Z01818 Encounter for other preprocedural examination: Secondary | ICD-10-CM

## 2024-08-17 DIAGNOSIS — I130 Hypertensive heart and chronic kidney disease with heart failure and stage 1 through stage 4 chronic kidney disease, or unspecified chronic kidney disease: Secondary | ICD-10-CM | POA: Diagnosis not present

## 2024-08-17 DIAGNOSIS — G959 Disease of spinal cord, unspecified: Secondary | ICD-10-CM

## 2024-08-17 DIAGNOSIS — Z981 Arthrodesis status: Principal | ICD-10-CM

## 2024-08-17 DIAGNOSIS — E785 Hyperlipidemia, unspecified: Secondary | ICD-10-CM | POA: Diagnosis not present

## 2024-08-17 DIAGNOSIS — I5030 Unspecified diastolic (congestive) heart failure: Secondary | ICD-10-CM | POA: Insufficient documentation

## 2024-08-17 DIAGNOSIS — G992 Myelopathy in diseases classified elsewhere: Secondary | ICD-10-CM | POA: Diagnosis not present

## 2024-08-17 DIAGNOSIS — M5002 Cervical disc disorder with myelopathy, mid-cervical region, unspecified level: Secondary | ICD-10-CM | POA: Diagnosis not present

## 2024-08-17 DIAGNOSIS — F17210 Nicotine dependence, cigarettes, uncomplicated: Secondary | ICD-10-CM | POA: Diagnosis not present

## 2024-08-17 DIAGNOSIS — M5412 Radiculopathy, cervical region: Secondary | ICD-10-CM | POA: Diagnosis not present

## 2024-08-17 DIAGNOSIS — G9589 Other specified diseases of spinal cord: Secondary | ICD-10-CM | POA: Insufficient documentation

## 2024-08-17 HISTORY — PX: ANTERIOR CERVICAL DECOMP/DISCECTOMY FUSION: SHX1161

## 2024-08-17 LAB — GLUCOSE, CAPILLARY
Glucose-Capillary: 118 mg/dL — ABNORMAL HIGH (ref 70–99)
Glucose-Capillary: 132 mg/dL — ABNORMAL HIGH (ref 70–99)

## 2024-08-17 SURGERY — ANTERIOR CERVICAL DECOMPRESSION/DISCECTOMY FUSION 2 LEVELS
Anesthesia: General

## 2024-08-17 MED ORDER — ENOXAPARIN SODIUM 40 MG/0.4ML IJ SOSY
40.0000 mg | PREFILLED_SYRINGE | INTRAMUSCULAR | Status: DC
  Administered 2024-08-18 – 2024-08-19 (×2): 40 mg via SUBCUTANEOUS
  Filled 2024-08-17 (×2): qty 0.4

## 2024-08-17 MED ORDER — FENTANYL CITRATE (PF) 100 MCG/2ML IJ SOLN
INTRAMUSCULAR | Status: AC
Start: 2024-08-17 — End: 2024-08-17
  Filled 2024-08-17: qty 2

## 2024-08-17 MED ORDER — SODIUM CHLORIDE 0.9% FLUSH
3.0000 mL | Freq: Two times a day (BID) | INTRAVENOUS | Status: DC
  Administered 2024-08-17 – 2024-08-19 (×4): 3 mL via INTRAVENOUS

## 2024-08-17 MED ORDER — METHOCARBAMOL 500 MG PO TABS
500.0000 mg | ORAL_TABLET | Freq: Four times a day (QID) | ORAL | 0 refills | Status: DC | PRN
  Filled 2024-08-17 (×2): qty 120, 30d supply, fill #0

## 2024-08-17 MED ORDER — DOCUSATE SODIUM 100 MG PO CAPS
100.0000 mg | ORAL_CAPSULE | Freq: Two times a day (BID) | ORAL | Status: DC
  Administered 2024-08-17 – 2024-08-19 (×4): 100 mg via ORAL
  Filled 2024-08-17 (×4): qty 1

## 2024-08-17 MED ORDER — CLONIDINE HCL 0.1 MG PO TABS
0.2000 mg | ORAL_TABLET | Freq: Two times a day (BID) | ORAL | Status: DC
  Administered 2024-08-17 – 2024-08-19 (×4): 0.2 mg via ORAL
  Filled 2024-08-17 (×4): qty 2

## 2024-08-17 MED ORDER — MIDAZOLAM HCL 2 MG/2ML IJ SOLN
INTRAMUSCULAR | Status: DC | PRN
  Administered 2024-08-17: 2 mg via INTRAVENOUS

## 2024-08-17 MED ORDER — KETOROLAC TROMETHAMINE 15 MG/ML IJ SOLN
INTRAMUSCULAR | Status: AC
  Filled 2024-08-17: qty 1

## 2024-08-17 MED ORDER — CHLORHEXIDINE GLUCONATE 0.12 % MT SOLN
OROMUCOSAL | Status: AC
  Filled 2024-08-17: qty 15

## 2024-08-17 MED ORDER — SUCCINYLCHOLINE CHLORIDE 200 MG/10ML IV SOSY
PREFILLED_SYRINGE | INTRAVENOUS | Status: AC
  Filled 2024-08-17: qty 10

## 2024-08-17 MED ORDER — DROPERIDOL 2.5 MG/ML IJ SOLN: 0.6250 mg | Freq: Once | INTRAMUSCULAR | Status: DC | PRN

## 2024-08-17 MED ORDER — CHLORHEXIDINE GLUCONATE 0.12 % MT SOLN
15.0000 mL | Freq: Once | OROMUCOSAL | Status: AC
  Administered 2024-08-17: 15 mL via OROMUCOSAL

## 2024-08-17 MED ORDER — VITAMIN D 25 MCG (1000 UNIT) PO TABS
1000.0000 [IU] | ORAL_TABLET | Freq: Every day | ORAL | Status: DC
  Administered 2024-08-17 – 2024-08-19 (×3): 1000 [IU] via ORAL
  Filled 2024-08-17 (×3): qty 1

## 2024-08-17 MED ORDER — ORAL CARE MOUTH RINSE: 15.0000 mL | Freq: Once | OROMUCOSAL | Status: AC

## 2024-08-17 MED ORDER — PHENOL 1.4 % MT LIQD: 1.0000 | OROMUCOSAL | Status: DC | PRN

## 2024-08-17 MED ORDER — 0.9 % SODIUM CHLORIDE (POUR BTL) OPTIME
TOPICAL | Status: DC | PRN
  Administered 2024-08-17: 340 mL

## 2024-08-17 MED ORDER — ALBUTEROL SULFATE (2.5 MG/3ML) 0.083% IN NEBU: 2.5000 mg | INHALATION_SOLUTION | RESPIRATORY_TRACT | Status: DC | PRN

## 2024-08-17 MED ORDER — ONDANSETRON HCL 4 MG/2ML IJ SOLN: 4.0000 mg | Freq: Four times a day (QID) | INTRAMUSCULAR | Status: DC | PRN

## 2024-08-17 MED ORDER — METHOCARBAMOL 500 MG PO TABS
ORAL_TABLET | ORAL | Status: AC
  Filled 2024-08-17: qty 1

## 2024-08-17 MED ORDER — SODIUM CHLORIDE 0.9 % IV SOLN: INTRAVENOUS | Status: DC

## 2024-08-17 MED ORDER — PANTOPRAZOLE SODIUM 40 MG PO TBEC
40.0000 mg | DELAYED_RELEASE_TABLET | Freq: Every day | ORAL | Status: DC
  Administered 2024-08-18 – 2024-08-19 (×2): 40 mg via ORAL
  Filled 2024-08-17 (×2): qty 1

## 2024-08-17 MED ORDER — FENTANYL CITRATE (PF) 100 MCG/2ML IJ SOLN
25.0000 ug | INTRAMUSCULAR | Status: DC | PRN
  Administered 2024-08-17: 50 ug via INTRAVENOUS
  Administered 2024-08-17: 25 ug via INTRAVENOUS
  Administered 2024-08-17: 50 ug via INTRAVENOUS
  Administered 2024-08-17: 25 ug via INTRAVENOUS

## 2024-08-17 MED ORDER — ACETAMINOPHEN 325 MG PO TABS: 650.0000 mg | ORAL_TABLET | ORAL | Status: DC | PRN

## 2024-08-17 MED ORDER — BUDESON-GLYCOPYRROL-FORMOTEROL 160-9-4.8 MCG/ACT IN AERO
2.0000 | INHALATION_SPRAY | Freq: Two times a day (BID) | RESPIRATORY_TRACT | Status: DC
  Administered 2024-08-17 – 2024-08-19 (×5): 2 via RESPIRATORY_TRACT
  Filled 2024-08-17: qty 5.9

## 2024-08-17 MED ORDER — IRBESARTAN 150 MG PO TABS
300.0000 mg | ORAL_TABLET | Freq: Every day | ORAL | Status: DC
  Administered 2024-08-17 – 2024-08-19 (×3): 300 mg via ORAL
  Filled 2024-08-17 (×3): qty 2

## 2024-08-17 MED ORDER — OXYCODONE HCL 5 MG PO TABS
10.0000 mg | ORAL_TABLET | ORAL | Status: DC | PRN
  Administered 2024-08-17 – 2024-08-19 (×11): 10 mg via ORAL
  Filled 2024-08-17 (×11): qty 2

## 2024-08-17 MED ORDER — SODIUM CHLORIDE 0.9 % IV SOLN
INTRAVENOUS | Status: DC | PRN
  Administered 2024-08-17: .1 ug/kg/min via INTRAVENOUS

## 2024-08-17 MED ORDER — ACETAMINOPHEN 10 MG/ML IV SOLN
INTRAVENOUS | Status: DC | PRN
  Administered 2024-08-17: 1000 mg via INTRAVENOUS

## 2024-08-17 MED ORDER — ACETAMINOPHEN 650 MG RE SUPP: 650.0000 mg | RECTAL | Status: DC | PRN

## 2024-08-17 MED ORDER — METHOCARBAMOL 1000 MG/10ML IJ SOLN: 500.0000 mg | Freq: Four times a day (QID) | INTRAMUSCULAR | Status: DC | PRN

## 2024-08-17 MED ORDER — POLYETHYLENE GLYCOL 3350 17 G PO PACK
17.0000 g | PACK | Freq: Every day | ORAL | Status: DC | PRN
  Administered 2024-08-19: 17 g via ORAL
  Filled 2024-08-17: qty 1

## 2024-08-17 MED ORDER — SENNA 8.6 MG PO TABS
1.0000 | ORAL_TABLET | Freq: Two times a day (BID) | ORAL | Status: DC
  Administered 2024-08-17 – 2024-08-19 (×5): 8.6 mg via ORAL
  Filled 2024-08-17 (×5): qty 1

## 2024-08-17 MED ORDER — METHOCARBAMOL 500 MG PO TABS
500.0000 mg | ORAL_TABLET | Freq: Four times a day (QID) | ORAL | Status: DC | PRN
  Administered 2024-08-17 – 2024-08-19 (×5): 500 mg via ORAL
  Filled 2024-08-17 (×4): qty 1

## 2024-08-17 MED ORDER — BUSPIRONE HCL 10 MG PO TABS
30.0000 mg | ORAL_TABLET | Freq: Two times a day (BID) | ORAL | Status: DC
  Administered 2024-08-17 – 2024-08-19 (×4): 30 mg via ORAL
  Filled 2024-08-17 (×4): qty 3

## 2024-08-17 MED ORDER — HYDROMORPHONE HCL 1 MG/ML IJ SOLN
0.5000 mg | INTRAMUSCULAR | Status: AC | PRN
  Administered 2024-08-17 (×4): 0.5 mg via INTRAVENOUS

## 2024-08-17 MED ORDER — SORBITOL 70 % SOLN: 30.0000 mL | Freq: Every day | Status: DC | PRN

## 2024-08-17 MED ORDER — MENTHOL 3 MG MT LOZG: 1.0000 | LOZENGE | OROMUCOSAL | Status: DC | PRN

## 2024-08-17 MED ORDER — SUCCINYLCHOLINE CHLORIDE 200 MG/10ML IV SOSY
PREFILLED_SYRINGE | INTRAVENOUS | Status: DC | PRN
  Administered 2024-08-17: 100 mg via INTRAVENOUS

## 2024-08-17 MED ORDER — FENTANYL CITRATE (PF) 100 MCG/2ML IJ SOLN
50.0000 ug | Freq: Once | INTRAMUSCULAR | Status: AC
  Administered 2024-08-17: 50 ug via INTRAVENOUS

## 2024-08-17 MED ORDER — PROPOFOL 10 MG/ML IV BOLUS
INTRAVENOUS | Status: DC | PRN
  Administered 2024-08-17: 100 ug/kg/min via INTRAVENOUS
  Administered 2024-08-17: 100 mg via INTRAVENOUS

## 2024-08-17 MED ORDER — SODIUM CHLORIDE 0.9% FLUSH: 3.0000 mL | INTRAVENOUS | Status: DC | PRN

## 2024-08-17 MED ORDER — ACETAMINOPHEN 10 MG/ML IV SOLN
INTRAVENOUS | Status: AC
  Filled 2024-08-17: qty 100

## 2024-08-17 MED ORDER — DEXAMETHASONE SODIUM PHOSPHATE 10 MG/ML IJ SOLN
INTRAMUSCULAR | Status: DC | PRN
Start: 2024-08-17 — End: 2024-08-17
  Administered 2024-08-17: 10 mg via INTRAVENOUS

## 2024-08-17 MED ORDER — DILTIAZEM HCL ER COATED BEADS 180 MG PO CP24
180.0000 mg | ORAL_CAPSULE | ORAL | Status: DC
  Administered 2024-08-18 – 2024-08-19 (×2): 180 mg via ORAL
  Filled 2024-08-17 (×2): qty 1

## 2024-08-17 MED ORDER — SODIUM CHLORIDE 0.9 % IV SOLN
250.0000 mL | INTRAVENOUS | Status: AC
  Administered 2024-08-18: 250 mL via INTRAVENOUS

## 2024-08-17 MED ORDER — HYDROMORPHONE HCL 1 MG/ML IJ SOLN
INTRAMUSCULAR | Status: AC
Start: 2024-08-17 — End: 2024-08-17
  Filled 2024-08-17: qty 1

## 2024-08-17 MED ORDER — HYDROMORPHONE HCL 1 MG/ML IJ SOLN
INTRAMUSCULAR | Status: AC
  Filled 2024-08-17: qty 1

## 2024-08-17 MED ORDER — OXYCODONE HCL 5 MG PO TABS: 5.0000 mg | ORAL_TABLET | ORAL | Status: DC | PRN

## 2024-08-17 MED ORDER — SURGIFLO WITH THROMBIN (HEMOSTATIC MATRIX KIT) OPTIME
TOPICAL | Status: DC | PRN
  Administered 2024-08-17: 1 via TOPICAL

## 2024-08-17 MED ORDER — FENTANYL CITRATE (PF) 100 MCG/2ML IJ SOLN
INTRAMUSCULAR | Status: AC
  Filled 2024-08-17: qty 2

## 2024-08-17 MED ORDER — PROPOFOL 1000 MG/100ML IV EMUL
INTRAVENOUS | Status: AC
  Filled 2024-08-17: qty 100

## 2024-08-17 MED ORDER — GABAPENTIN 400 MG PO CAPS
800.0000 mg | ORAL_CAPSULE | Freq: Three times a day (TID) | ORAL | Status: DC
  Administered 2024-08-17 – 2024-08-19 (×6): 800 mg via ORAL
  Filled 2024-08-17 (×6): qty 2

## 2024-08-17 MED ORDER — OXYCODONE HCL 5 MG PO TABS
5.0000 mg | ORAL_TABLET | ORAL | 0 refills | Status: DC | PRN
  Filled 2024-08-17: qty 60, 4d supply, fill #0

## 2024-08-17 MED ORDER — MAGNESIUM CITRATE PO SOLN: 1.0000 | Freq: Once | ORAL | Status: DC | PRN

## 2024-08-17 MED ORDER — ACETAMINOPHEN 500 MG PO TABS
1000.0000 mg | ORAL_TABLET | Freq: Four times a day (QID) | ORAL | Status: DC
  Administered 2024-08-17 – 2024-08-19 (×7): 1000 mg via ORAL
  Filled 2024-08-17 (×8): qty 2

## 2024-08-17 MED ORDER — NAPHAZOLINE-GLYCERIN 0.012-0.25 % OP SOLN: 1.0000 [drp] | Freq: Four times a day (QID) | OPHTHALMIC | Status: DC | PRN

## 2024-08-17 MED ORDER — LIDOCAINE HCL (CARDIAC) PF 100 MG/5ML IV SOSY
PREFILLED_SYRINGE | INTRAVENOUS | Status: DC | PRN
  Administered 2024-08-17: 50 mg via INTRAVENOUS

## 2024-08-17 MED ORDER — LACTATED RINGERS IV SOLN: INTRAVENOUS | Status: DC | PRN

## 2024-08-17 MED ORDER — MIDAZOLAM HCL 2 MG/2ML IJ SOLN
INTRAMUSCULAR | Status: AC
  Filled 2024-08-17: qty 2

## 2024-08-17 MED ORDER — PRAMIPEXOLE DIHYDROCHLORIDE 0.25 MG PO TABS
0.2500 mg | ORAL_TABLET | Freq: Three times a day (TID) | ORAL | Status: DC
  Administered 2024-08-18 – 2024-08-19 (×4): 0.25 mg via ORAL
  Filled 2024-08-17 (×5): qty 1

## 2024-08-17 MED ORDER — ONDANSETRON HCL 4 MG/2ML IJ SOLN
INTRAMUSCULAR | Status: AC
  Filled 2024-08-17: qty 2

## 2024-08-17 MED ORDER — REMIFENTANIL HCL 1 MG IV SOLR
INTRAVENOUS | Status: AC
Start: 2024-08-17 — End: 2024-08-17
  Filled 2024-08-17: qty 1000

## 2024-08-17 MED ORDER — HYDROMORPHONE HCL 1 MG/ML IJ SOLN: 1.0000 mg | INTRAMUSCULAR | Status: AC | PRN

## 2024-08-17 MED ORDER — ALBUTEROL SULFATE HFA 108 (90 BASE) MCG/ACT IN AERS
INHALATION_SPRAY | RESPIRATORY_TRACT | Status: AC
  Filled 2024-08-17: qty 6.7

## 2024-08-17 MED ORDER — HYDRALAZINE HCL 20 MG/ML IJ SOLN: 5.0000 mg | Freq: Four times a day (QID) | INTRAMUSCULAR | Status: DC | PRN

## 2024-08-17 MED ORDER — PHENYLEPHRINE HCL-NACL 20-0.9 MG/250ML-% IV SOLN
INTRAVENOUS | Status: AC
  Filled 2024-08-17: qty 250

## 2024-08-17 MED ORDER — PHENYLEPHRINE HCL-NACL 20-0.9 MG/250ML-% IV SOLN
INTRAVENOUS | Status: DC | PRN
  Administered 2024-08-17: 30 ug/min via INTRAVENOUS

## 2024-08-17 MED ORDER — ALBUTEROL SULFATE HFA 108 (90 BASE) MCG/ACT IN AERS
INHALATION_SPRAY | RESPIRATORY_TRACT | Status: DC | PRN
  Administered 2024-08-17: 2 via RESPIRATORY_TRACT

## 2024-08-17 MED ORDER — BUPIVACAINE-EPINEPHRINE (PF) 0.5% -1:200000 IJ SOLN
INTRAMUSCULAR | Status: DC | PRN
  Administered 2024-08-17: 10 mL

## 2024-08-17 MED ORDER — ONDANSETRON HCL 4 MG PO TABS: 4.0000 mg | ORAL_TABLET | Freq: Four times a day (QID) | ORAL | Status: DC | PRN

## 2024-08-17 MED ORDER — FENTANYL CITRATE (PF) 100 MCG/2ML IJ SOLN
INTRAMUSCULAR | Status: DC | PRN
  Administered 2024-08-17: 25 ug via INTRAVENOUS
  Administered 2024-08-17: 50 ug via INTRAVENOUS
  Administered 2024-08-17: 25 ug via INTRAVENOUS

## 2024-08-17 MED ORDER — REMIFENTANIL HCL 1 MG IV SOLR
INTRAVENOUS | Status: AC
  Filled 2024-08-17: qty 1000

## 2024-08-17 MED ORDER — CEFAZOLIN SODIUM-DEXTROSE 2-4 GM/100ML-% IV SOLN
INTRAVENOUS | Status: AC
  Filled 2024-08-17: qty 100

## 2024-08-17 MED ORDER — PROPOFOL 10 MG/ML IV BOLUS
INTRAVENOUS | Status: AC
  Filled 2024-08-17: qty 20

## 2024-08-17 MED ORDER — ONDANSETRON HCL 4 MG/2ML IJ SOLN
INTRAMUSCULAR | Status: DC | PRN
  Administered 2024-08-17: 4 mg via INTRAVENOUS

## 2024-08-17 MED ORDER — SENNA 8.6 MG PO TABS
1.0000 | ORAL_TABLET | Freq: Two times a day (BID) | ORAL | 0 refills | Status: DC | PRN
  Filled 2024-08-17: qty 30, 15d supply, fill #0

## 2024-08-17 MED ORDER — ESCITALOPRAM OXALATE 10 MG PO TABS
20.0000 mg | ORAL_TABLET | ORAL | Status: DC
  Administered 2024-08-18 – 2024-08-19 (×2): 20 mg via ORAL
  Filled 2024-08-17 (×2): qty 2

## 2024-08-17 MED ORDER — MINOXIDIL 10 MG PO TABS
10.0000 mg | ORAL_TABLET | Freq: Every day | ORAL | Status: DC
  Administered 2024-08-17 – 2024-08-19 (×3): 10 mg via ORAL
  Filled 2024-08-17 (×3): qty 1

## 2024-08-17 MED ORDER — LABETALOL HCL 5 MG/ML IV SOLN
INTRAVENOUS | Status: AC
  Filled 2024-08-17: qty 4

## 2024-08-17 MED ORDER — KETOROLAC TROMETHAMINE 15 MG/ML IJ SOLN
7.5000 mg | Freq: Four times a day (QID) | INTRAMUSCULAR | Status: AC
  Administered 2024-08-17 – 2024-08-18 (×4): 7.5 mg via INTRAVENOUS
  Filled 2024-08-17 (×3): qty 1

## 2024-08-17 MED ORDER — LEVOTHYROXINE SODIUM 25 MCG PO TABS
25.0000 ug | ORAL_TABLET | Freq: Every day | ORAL | Status: DC
  Administered 2024-08-18 – 2024-08-19 (×2): 25 ug via ORAL
  Filled 2024-08-17 (×2): qty 1

## 2024-08-17 MED ORDER — LIDOCAINE HCL (PF) 2 % IJ SOLN
INTRAMUSCULAR | Status: AC
  Filled 2024-08-17: qty 5

## 2024-08-17 MED ORDER — CEFAZOLIN IN SODIUM CHLORIDE 2-0.9 GM/100ML-% IV SOLN: 2.0000 g | Freq: Once | INTRAVENOUS | Status: DC

## 2024-08-17 MED ORDER — HEMOSTATIC AGENTS (NO CHARGE) OPTIME
TOPICAL | Status: DC | PRN
  Administered 2024-08-17: 1 via TOPICAL

## 2024-08-17 SURGICAL SUPPLY — 39 items
BASIN KIT SINGLE STR (MISCELLANEOUS) ×2 IMPLANT
BUR NEURO DRILL SOFT 3.0X3.8M (BURR) ×2 IMPLANT
DERMABOND ADVANCED .7 DNX12 (GAUZE/BANDAGES/DRESSINGS) ×2 IMPLANT
DRAIN CHANNEL JP 10F RND 20C F (MISCELLANEOUS) IMPLANT
DRAPE C ARM PK CFD 31 SPINE (DRAPES) ×2 IMPLANT
DRAPE LAPAROTOMY 77X122 PED (DRAPES) ×2 IMPLANT
DRAPE MICROSCOPE SPINE 48X150 (DRAPES) ×2 IMPLANT
DRSG TEGADERM 4X4.75 (GAUZE/BANDAGES/DRESSINGS) IMPLANT
ELECTRODE REM PT RTRN 9FT ADLT (ELECTROSURGICAL) ×2 IMPLANT
EVACUATOR SILICONE 100CC (DRAIN) IMPLANT
FEE INTRAOP CADWELL SUPPLY NCS (MISCELLANEOUS) IMPLANT
FEE INTRAOP MONITOR IMPULS NCS (MISCELLANEOUS) IMPLANT
GAUZE SPONGE 2X2 STRL 8-PLY (GAUZE/BANDAGES/DRESSINGS) IMPLANT
GLOVE BIOGEL PI IND STRL 6.5 (GLOVE) ×2 IMPLANT
GLOVE SURG SYN 6.5 PF PI (GLOVE) ×2 IMPLANT
GLOVE SURG SYN 8.5 PF PI (GLOVE) ×6 IMPLANT
GOWN SRG LRG LVL 4 IMPRV REINF (GOWNS) ×2 IMPLANT
GOWN SRG XL LVL 3 NONREINFORCE (GOWNS) ×2 IMPLANT
HEMOSTAT SURGICEL 2X3 (HEMOSTASIS) IMPLANT
KIT TURNOVER KIT A (KITS) ×2 IMPLANT
MANIFOLD NEPTUNE II (INSTRUMENTS) ×2 IMPLANT
NS IRRIG 500ML POUR BTL (IV SOLUTION) ×2 IMPLANT
PACK LAMINECTOMY ARMC (PACKS) ×2 IMPLANT
PAD ARMBOARD POSITIONER FOAM (MISCELLANEOUS) ×4 IMPLANT
PIN CASPAR 14 (PIN) ×2 IMPLANT
PLATE ACP 1.6 18 (Plate) IMPLANT
PLATE ACP 1.6X16 1LVL (Plate) IMPLANT
SCREW ACP 3.5X17 S/D VARIA (Screw) IMPLANT
SCREW ACP VA SD 3.5X15 (Screw) IMPLANT
SCREW VA ST 4.0X15 (Screw) IMPLANT
SPACER CERVICAL FRGE 12X14X7-7 (Spacer) IMPLANT
SPONGE KITTNER 5P (MISCELLANEOUS) ×2 IMPLANT
SURGIFLO W/THROMBIN 8M KIT (HEMOSTASIS) ×2 IMPLANT
SUT STRATA 3-0 15 PS-2 (SUTURE) ×2 IMPLANT
SUT VIC AB 3-0 SH 8-18 (SUTURE) ×2 IMPLANT
SUTURE EHLN 3-0 FS-10 30 BLK (SUTURE) IMPLANT
SYR 20ML LL LF (SYRINGE) ×2 IMPLANT
TAPE CLOTH 3X10 WHT NS LF (GAUZE/BANDAGES/DRESSINGS) ×4 IMPLANT
TRAP FLUID SMOKE EVACUATOR (MISCELLANEOUS) ×2 IMPLANT

## 2024-08-17 NOTE — Discharge Instructions (Signed)
 Your surgeon has performed an operation on your cervical spine (neck) to relieve pressure on the spinal cord and/or nerves. This involved making an incision in the front of your neck and removing one or more of the discs that support your spine. Next, a small piece of bone, a titanium plate, and screws were used to fuse two or more of the vertebrae (bones) together.  The following are instructions to help in your recovery once you have been discharged from the hospital. Even if you feel well, it is important that you follow these activity guidelines. If you do not let your neck heal properly from the surgery, you can increase the chance of return of your symptoms and other complications.  * Do not take anti-inflammatory medications for 3 months after surgery (naproxen  [Aleve ], ibuprofen  [Advil , Motrin ], etc.). These medications can prevent your bones from healing properly.  Celebrex, if prescribed, is ok to take.  *Regarding compression stockings-  Please wear day and night until you are walking a couple hundred feet three times a day.   Activity    No bending, lifting, or twisting ("BLT"). Avoid lifting objects heavier than 10 pounds (gallon milk jug).  Where possible, avoid household activities that involve lifting, bending, reaching, pushing, or pulling such as laundry, vacuuming, grocery shopping, and childcare. Try to arrange for help from friends and family for these activities while your back heals.  Increase physical activity slowly as tolerated.  Taking short walks is encouraged, but avoid strenuous exercise. Do not jog, run, bicycle, lift weights, or participate in any other exercises unless specifically allowed by your doctor.  Talk to your doctor before resuming sexual activity.  You should not drive until cleared by your doctor.  Until released by your doctor, you should not return to work or school.  You should rest at home and let your body heal.   You may shower three days  after your surgery.  After showering, lightly dab your incision dry. Do not take a tub bath or go swimming until approved by your doctor at your follow-up appointment.  If your doctor ordered a cervical collar (neck brace) for you, you should wear it whenever you are out of bed. You may remove it when lying down or sleeping, but you should wear it at all other times. Not all neck surgeries require a cervical collar.  If you smoke, we strongly recommend that you quit.  Smoking has been proven to interfere with normal bone healing and will dramatically reduce the success rate of your surgery. Please contact [facility] (800-QUIT-NOW) and use the resources at www.[facility].com for assistance in stopping smoking.  Surgical Incision   If you have a dressing on your incision, you may remove it two days after your surgery. Keep your incision area clean and dry.  If you have staples or stitches on your incision, you should have a follow up scheduled for removal. If you do not have staples or stitches, you will have steri-strips (small pieces of surgical tape) or Dermabond glue. The steri-strips/glue should begin to peel away within about a week (it is fine if the steri-strips fall off before then). If the strips are still in place one week after your surgery, you may gently remove them.  Diet           You may return to your usual diet. However, you may experience discomfort when swallowing in the first month after your surgery. This is normal. You may find that softer foods are  more comfortable for you to swallow. Be sure to stay hydrated.  When to Contact Us   You may experience pain in your neck and/or pain between your shoulder blades. This is normal and should improve in the next few weeks with the help of pain medication, muscle relaxers, and rest. Some patients report that a warm compress on the back of the neck or between the shoulder blades helps.  However, should you experience any of the  following, contact us  immediately: New numbness or weakness Pain that is progressively getting worse, and is not relieved by your pain medication, muscle relaxers, rest, and warm compresses Bleeding, redness, swelling, pain, or drainage from surgical incision Chills or flu-like symptoms Fever greater than 101.0 F (38.3 C) Inability to eat, drink fluids, or take medications Problems with bowel or bladder functions Difficulty breathing or shortness of breath Warmth, tenderness, or swelling in your calf Contact Information How to contact us :  If you have any questions/concerns before or after surgery, you can reach us  at [phone], or you can send a mychart message. We can be reached by phone or mychart 8am-4pm, Monday-Friday.  *Please note: Calls after 4pm are forwarded to a third party answering service. Mychart messages are not routinely monitored during evenings, weekends, and holidays. Please call our office to contact the answering service for urgent concerns during non-business hours.

## 2024-08-17 NOTE — Anesthesia Procedure Notes (Addendum)
 Procedure Name: Intubation Date/Time: 08/17/2024 9:29 AM  Performed by: Duwayne Craven, CRNAPre-anesthesia Checklist: Patient identified, Patient being monitored, Timeout performed, Emergency Drugs available and Suction available Patient Re-evaluated:Patient Re-evaluated prior to induction Oxygen  Delivery Method: Circle system utilized Preoxygenation: Pre-oxygenation with 100% oxygen  Induction Type: IV induction Ventilation: Mask ventilation without difficulty and Oral airway inserted - appropriate to patient size Laryngoscope Size: 3 and McGrath Grade View: Grade I Tube type: Oral Tube size: 7.0 mm Number of attempts: 1 Airway Equipment and Method: Stylet Placement Confirmation: ETT inserted through vocal cords under direct vision, positive ETCO2 and breath sounds checked- equal and bilateral Secured at: 21 cm Tube secured with: Tape Dental Injury: Teeth and Oropharynx as per pre-operative assessment  Comments: Neck maintained in neutral alignment for intubation.

## 2024-08-17 NOTE — Consult Note (Signed)
 Initial Consultation Note   Patient: [name] FMW:[id number] DOB: January 12, 1956 PCP: Rachel GORMAN Dine, MD DOA: 08/17/2024 DOS: the patient was seen and examined on 08/17/2024 Primary service: Clois Fret, MD  Referring physician: Dr. Clois Reason for consult: COPD and HTN  Assessment/Plan:  COPD, Gold stage III - Stable - Tolerated procedure well, no breathing comfortably as her baseline, I do not feel we need chest xray or blood gas as she appears to be breathing comfortably. - Reeducated her again about quitting smoking, patient reported that she has been gradually cut down smoking from 2 packs a day to 1 pack a day. - Incentive spirometry - Continue current breathing regimen of Breztri  -PRN albuterol   HTN Chronic HFpEF -Euvolemic - Blood pressure elevated, continue clonidine , Cardizem  and Benicar  - I also added as needed hydralazine   OSA -CPAP HS   TRH will sign off at present, please call us  again when needed.  HPI: [name] is a 68 y.o. female with past medical history of COPD Gold stage III with chronic hypoxic respiratory failure on 3 L oxygen , HTN/chronic HFpEF, cigarette smoking, cervical stenosis presented with elective decompression of her cervical spine.  Patient reported that her COPD has been fairly controlled, denied any cough wheezing shortness of breath.  She is a chronic cigarette smoking and has been trying to cut down her cigarette smoking and reported that she is able to cut down her cigarette smoking from 2 packs a day to 1 pack/day recently.  Review of Systems: As mentioned in the history of present illness. All other systems reviewed and are negative. Past Medical History:  Diagnosis Date   (HFpEF) heart failure with preserved ejection fraction (HCC)    Anxiety    Aortic atherosclerosis (HCC)    Arthritis    Bilateral hand numbness    CAD (coronary artery disease)    Cervical myelopathy (HCC)    Cervical spinal stenosis    Chest  pain at rest    Chronic pain syndrome    Chronic respiratory failure with hypoxia and hypercapnia (HCC)    CKD stage 3b, GFR 30-44 ml/min (HCC)    Community acquired pneumonia 01/10/2013   COPD (chronic obstructive pulmonary disease) (HCC)    DDD (degenerative disc disease), thoracic    Dependence on supplemental oxygen  (3L/[place] qhs PRN)    Depression    DM (diabetes mellitus), type 2 (HCC)    Dyspnea    Failed back surgical syndrome    GERD (gastroesophageal reflux disease)    Hyperlipidemia    Hypertension    Hypothyroid    Lumbar spondylosis    a.) s/p lumbar laminectomy, microdiscectomy, and L4-L5 fusion   Neuropathy    Nicotine  dependence    Normocytic anemia    OSA on CPAP    Pancreatitis 03/2024   Peripheral vascular disease (HCC)    Pulmonary hypertension (HCC)    Recurrent UTI (urinary tract infection)    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  no seizure since--pt does take keppra  and office note from neurologist dr. margaret on this chart   Sepsis due to pneumonia [facility])    Sinus tachycardia    Tobacco abuse  Urinary incontinence    Wound of left leg    being seen at wound clinic as of 03-23-24   Past Surgical History:  Procedure Laterality Date   CARPAL TUNNEL RELEASE Left    COLONOSCOPY WITH PROPOFOL  N/A 06/17/2022   Procedure: COLONOSCOPY WITH PROPOFOL ;  Surgeon: Maryruth Ole DASEN, MD;  Location: [facility] ENDOSCOPY;  Service: Endoscopy;  Laterality: N/A;   JOINT REPLACEMENT  08/26/2011   right total knee arthroplasty, left total knee   KNEE ARTHROPLASTY  10/28/2011   Procedure: COMPUTER ASSISTED TOTAL KNEE ARTHROPLASTY;  Surgeon: Norleen CROME Rendall III;  Location: [facility];  Service: Orthopedics;   Laterality: Left;  preop femoral nerve block   LAMINECTOMY AND MICRODISCECTOMY LUMBAR SPINE N/A    Procedure: LUMBAR LAMINECTOMY, MICRODISCECTOMY, AND L4-L5 FUSION   OPEN REDUCTION INTERNAL FIXATION (ORIF) METACARPAL Left 01/30/2024   Procedure: OPEN REDUCTION INTERNAL FIXATION (ORIF) OF LEFT 5TH METACARPAL SHAFT FRACTURE.;  Surgeon: Edie Norleen PARAS, MD;  Location: [facility] ORS;  Service: Orthopedics;  Laterality: Left;   PROCTOSCOPY N/A 06/18/2022   Procedure: RIGID PROCTOSCOPY;  Surgeon: Sheldon Standing, MD;  Location: [facility];  Service: General;  Laterality: N/A;   Social History:  reports that she has been smoking cigarettes. She has a 100 pack-year smoking history. She has been exposed to tobacco smoke. She has never used smokeless tobacco. She reports current alcohol  use. She reports that she does not use drugs.  Allergies  Allergen Reactions   Nsaids Other (See Comments)    Pt states it messes up her kidneys But takes aspirin  & ibuprofen    Thiazide-Type Diuretics Other (See Comments)    Other reaction(s): Due to CKD    Family History  Problem Relation Age of Onset   Heart attack Father 7   Alzheimer's disease Mother    Breast cancer Neg Hx     Prior to Admission medications   Medication Sig Start Date End Date Taking? Authorizing Provider  acetaminophen  (TYLENOL ) 650 MG CR tablet Take 2 tablets (1,300 mg total) by mouth every 8 (eight) hours as needed for pain. Home med. 07/28/24  Yes Awanda City, MD  albuterol  (VENTOLIN  HFA) 108 [phone] Base) MCG/ACT inhaler Inhale 2 puffs into the lungs every 4 (four) hours as needed for wheezing or shortness of breath. 11/29/22  Yes Bernardo Fend, DO  budesonide -glycopyrrolate -formoterol  (BREZTRI ) 160-9-4.8 MCG/ACT AERO inhaler Inhale 2 puffs into the lungs in the morning and at bedtime. 07/04/24 07/04/25 Yes [provider]  busPIRone  (BUSPAR ) 30 MG tablet Take 1 tablet (30 mg total) by mouth 2 (two) times daily. 08/07/24  Yes Rachel GORMAN Dine, MD  Cholecalciferol  (VITAMIN D -3) 25 MCG (1000 UT) CAPS Take 1,000 Units by mouth daily.   Yes [provider]  cloNIDine  (CATAPRES ) 0.2 MG tablet Take 1 tablet (0.2 mg total) by mouth 2 (two) times daily. 07/10/24  Yes Tejan-Sie, GORMAN Dine, MD  cyanocobalamin  (VITAMIN B12) 1000 MCG tablet TAKE 1 TABLET BY MOUTH EVERY DAY *NEW PRESCRIPTION REQUEST* 06/22/24  Yes Rachel GORMAN Dine, MD  diltiazem  (CARDIZEM  CD) 180 MG 24 hr capsule Take 180 mg by mouth every morning.   Yes [provider]  escitalopram  (LEXAPRO ) 20 MG tablet Take 1 tablet (20 mg total) by mouth every morning. 05/17/24  Yes Poggi, Norleen PARAS, MD  fenofibrate  micronized (LOFIBRA) 134 MG capsule Take 1 capsule (134 mg total) by mouth daily before breakfast. 08/07/24 11/05/24 Yes Tejan-Sie, GORMAN Dine, MD  fluticasone  (FLONASE ) 50 MCG/ACT nasal spray Place 1 spray into both  nostrils daily as needed for allergies. 05/17/24 08/15/24 Yes Poggi, Norleen PARAS, MD  gabapentin  (NEURONTIN ) 800 MG tablet TAKE 1 TABLET BY MOUTH THREE TIMES A DAY 08/14/24  Yes Tejan-Sie, GORMAN Dine, MD  levothyroxine  (SYNTHROID ) 25 MCG tablet TAKE 1 TABLET BY MOUTH EVERY DAY *NEW PRESCRIPTION REQUEST* 06/22/24  Yes Rachel GORMAN Dine, MD  lidocaine -prilocaine (EMLA) cream Apply 1 Application topically 4 (four) times daily as needed (pain). 07/20/24  Yes [provider]  methocarbamol  (ROBAXIN ) 500 MG tablet Take 1 tablet (500 mg total) by mouth 3 (three) times daily. 08/15/24  Yes Ulis Bottcher, PA-C  minoxidil  (LONITEN ) 10 MG tablet Take 1 tablet (10 mg total) by mouth daily. 07/10/24 10/08/24 Yes Rachel GORMAN Dine, MD  naphazoline-glycerin  (CLEAR EYES REDNESS) 0.012-0.25 % SOLN Place 1-2 drops into both eyes 4 (four) times daily as needed for eye irritation.   Yes [provider]  olmesartan  (BENICAR ) 40 MG tablet TAKE 1 TABLET BY MOUTH EVERY DAY 08/14/24  Yes Rachel GORMAN Dine, MD  OXYGEN  Inhale 3 L into the lungs See admin instructions. Inhale 3L of  oxygen  throughout the day as needed and at night   Yes [provider]  pantoprazole  (PROTONIX ) 40 MG tablet TAKE 1 TABLET BY MOUTH EVERY DAY IN THE MORNING 04/02/24  Yes Tejan-Sie, GORMAN Dine, MD  pramipexole  (MIRAPEX ) 0.25 MG tablet Take 1 tablet (0.25 mg total) by mouth 3 (three) times daily. 06/18/24 12/15/24 Yes Tejan-Sie, GORMAN Dine, MD  pyridOXINE (VITAMIN B6) 100 MG tablet TAKE 1 TABLET BY MOUTH EVERY DAY *NEW PRESCRIPTION REQUEST* 06/22/24  Yes Rachel GORMAN Dine, MD  NON FORMULARY Pt uses a c-pap nightly w/ oxygen     [provider]  oxyCODONE  (ROXICODONE ) 5 MG immediate release tablet Take 1 tablet (5 mg total) by mouth every 6 (six) hours as needed for severe pain (pain score 7-10). Patient taking differently: Take 5-10 mg by mouth every 6 (six) hours as needed for severe pain (pain score 7-10). 08/14/24   Ulis Bottcher, PA-C    Physical Exam: Vitals:   08/17/24 1213 08/17/24 1215 08/17/24 1218 08/17/24 1226  BP:  (!) 198/78  (!) 195/76  Pulse: 63 67 66 68  Resp: (!) 22 (!) 21 (!) 22 19  Temp:      TempSrc:      SpO2: 99% 99% 100% 96%  Weight:      Height:       Eyes: PERRL, lids and conjunctivae normal ENMT: Mucous membranes are moist. Posterior pharynx clear of any exudate or lesions.Normal dentition.  Neck: normal, supple, no masses, no thyromegaly Respiratory: clear to auscultation bilaterally, no wheezing, no crackles. Normal respiratory effort. No accessory muscle use.  Cardiovascular: Regular rate and rhythm, no murmurs / rubs / gallops. No extremity edema. 2+ pedal pulses. No carotid bruits.  Abdomen: no tenderness, no masses palpated. No hepatosplenomegaly. Bowel sounds positive.  Musculoskeletal: no clubbing / cyanosis. No joint deformity upper and lower extremities. Good ROM, no contractures. Normal muscle tone.  Skin: no rashes, lesions, ulcers. No induration Neurologic: CN 2-12 grossly intact. Sensation intact, DTR normal.  Muscle strength 5/5 on both  sides Psychiatric: Normal judgment and insight. Alert and oriented x 3. Normal mood.    Data Reviewed:  PFT from last year reviewed  Family Communication: None Primary team communication: Neurosurgery Thank you very much for involving us  in the care of your patient.  Author: Cort ONEIDA Mana, MD 08/17/2024 12:42 PM  For on call review [email or web address].

## 2024-08-17 NOTE — Anesthesia Preprocedure Evaluation (Signed)
 Anesthesia Evaluation  Patient identified by MRN, date of birth, ID band Patient awake    Reviewed: Allergy & Precautions, H&P , NPO status , Patient's Chart, lab work & pertinent test results  History of Anesthesia Complications Negative for: history of anesthetic complications  Airway Mallampati: II  TM Distance: >3 FB Neck ROM: full    Dental  (+) Edentulous Upper, Edentulous Lower, Dental Advidsory Given   Pulmonary shortness of breath, sleep apnea , COPD (prn supplemental oxygen ), neg recent URI, Current Smoker   Pulmonary exam normal        Cardiovascular hypertension, Pt. on medications (-) angina + CAD, + Peripheral Vascular Disease and +CHF  (-) Past MI and (-) Cardiac Stents Normal cardiovascular exam(-) dysrhythmias (-) Valvular Problems/Murmurs     Neuro/Psych Seizures - (one episode),  PSYCHIATRIC DISORDERS Anxiety Depression     Neuromuscular disease (hand nueropathy)  negative psych ROS   GI/Hepatic Neg liver ROS,GERD  Medicated and Controlled,,  Endo/Other  diabetesHypothyroidism    Renal/GU Renal InsufficiencyRenal disease     Musculoskeletal   Abdominal Normal abdominal exam  (+)   Peds  Hematology negative hematology ROS (+)   Anesthesia Other Findings Past Medical History: No date: (HFpEF) heart failure with preserved ejection fraction (HCC) 03/29/2011: Acute renal failure (HCC) 11/2020: Acute respiratory distress 04/14/2014: Acute respiratory failure (HCC) No date: Anxiety No date: Arthritis No date: CHF (congestive heart failure) (HCC) No date: Chronic pain syndrome No date: Chronic respiratory failure with hypoxia and hypercapnia  (HCC) No date: CKD stage 3b, GFR 30-44 ml/min (HCC) 01/10/2013: Community acquired pneumonia No date: COPD (chronic obstructive pulmonary disease) (HCC)     Comment:  no inhalers--smoker, no oxygen  No date: Depression No date: Dyspnea No date: Emphysema lung  (HCC) No date: Failed back surgical syndrome No date: GERD (gastroesophageal reflux disease) No date: Hyperlipidemia No date: Hypertension No date: Hypothyroid No date: Lumbar spondylosis No date: Nicotine  dependence No date: Normocytic anemia No date: OSA on CPAP No date: Peripheral vascular disease (HCC) No date: Pre-diabetes No date: Recurrent UTI (urinary tract infection) No date: Restless leg syndrome No date: Seizures (HCC)     Comment:  02/13/2011 -hx of seizure due to hypertensive               encephalopathy in setting of narcotic withdrawal --pt               had run out of her pain medicine she was taking for her               knee and back pain.  no seizure since--pt does take               keppra  and office note from neurologist dr. margaret on               this chart No date: Sepsis due to pneumonia [facility]) No date: Sinus tachycardia No date: Urinary incontinence No date: Wound of left leg     Comment:  being seen at wound clinic as of 03-23-24  Past Surgical History: No date: CARPAL TUNNEL RELEASE     Comment:  left 06/17/2022: COLONOSCOPY WITH PROPOFOL ; N/A     Comment:  Procedure: COLONOSCOPY WITH PROPOFOL ;  Surgeon:               Maryruth Ole DASEN, MD;  Location: [facility] END[facility];                Service: Endoscopy;  Laterality: N/A; 08/26/2011:  JOINT REPLACEMENT     Comment:  right total knee arthroplasty, left total knee 10/28/2011: KNEE ARTHROPLASTY     Comment:  Procedure: COMPUTER ASSISTED TOTAL KNEE ARTHROPLASTY;                Surgeon: Norleen CROME Rendall III;  Location: [facility];  Service:              Orthopedics;  Laterality: Left;  preop femoral nerve               block No date: KNEE SURGERY     Comment:  right No date: laminectomy and diskectomy     Comment:  L4-5 with fusion 01/30/2024: OPEN REDUCTION INTERNAL FIXATION (ORIF) METACARPAL; Left     Comment:  Procedure: OPEN REDUCTION INTERNAL FIXATION (ORIF) OF               LEFT 5TH METACARPAL SHAFT  FRACTURE.;  Surgeon: Edie Norleen PARAS, MD;  Location: [facility] OR[facility];  Service: Orthopedics;                Laterality: Left; 06/18/2022: PROCTOSCOPY; N/A     Comment:  Procedure: RIGID PROCTOSCOPY;  Surgeon: Sheldon Standing,               MD;  Location: [facility];  Service: General;  Laterality:               N/A;  BMI    Body Mass Index: 25.99 kg/m      Reproductive/Obstetrics negative OB ROS                              Anesthesia Physical Anesthesia Plan  ASA: 3  Anesthesia Plan: General   Post-op Pain Management: Ofirmev  IV (intra-op)*   Induction: Intravenous  PONV Risk Score and Plan: 2 and Dexamethasone  and Ondansetron   Airway Management Planned: Oral ETT  Additional Equipment:   Intra-op Plan:   Post-operative Plan: Extubation in OR  Informed Consent: I have reviewed the patients History and Physical, chart, labs and discussed the procedure including the risks, benefits and alternatives for the proposed anesthesia with the patient or authorized representative who has indicated his/her understanding and acceptance.     Dental Advisory Given  Plan Discussed with: Anesthesiologist, CRNA and Surgeon  Anesthesia Plan Comments:          Anesthesia Quick Evaluation

## 2024-08-17 NOTE — Op Note (Signed)
 Indications: [name] is a 68 y.o. female with G95.9 Cervical myelopathy, M48.02 Spinal stenosis of cervical region   Findings: stenosis, successful decompression  Preoperative Diagnosis: G95.9 Cervical myelopathy, M48.02 Spinal stenosis of cervical region  Postoperative Diagnosis: same   EBL: 50 ml IVF: see anesthesia record Drains: one Disposition: Extubated and Stable to PACU Complications: none  No foley catheter was placed.   Preoperative Note:    Risks of surgery discussed include: infection, bleeding, stroke, coma, death, paralysis, CSF leak, nerve/spinal cord injury, numbness, tingling, weakness, complex regional pain syndrome, recurrent stenosis and/or disc herniation, vascular injury, development of instability, neck/back pain, need for further surgery, persistent symptoms, development of deformity, and the risks of anesthesia. The patient understood these risks and agreed to proceed.  Procedure:  1) Anterior cervical diskectomy and fusion at C3-4 and C6-7 via 2 separate incisions 2) Anterior cervical instrumentation at C3-4 and C6-7 3) Structural allograft consisting of corticocancellous allograft   Procedure: After obtaining informed consent, the patient taken to the operating room, placed in supine position, general anesthesia induced.  The patient had a small shoulder roll placed behind their shoulders.  The patient received preop antibiotics and IV Decadron .  A timeout was performed. The patient had a neck incision outlined, was prepped and draped in usual sterile fashion. The incision was injected with local anesthetic.   2 incisions were planned. Each incision was opened, dissection taken down medial to the carotid artery and jugular vein, lateral to the trachea and esophagus.  The prevertebral fascia was identified and a localizing x-ray demonstrated the correct levels.    The C3-4 level was addressed first.   The longus colli were dissected laterally, and  self-retaining retractors placed to open the operative field. The microscope was then brought into the field.  With this complete, distractor pins were placed in the vertebral bodies of C3 and C4. The distractor was placed, and the annulus at C3/4 was opened using a bovie.  Curettes and pituitary rongeurs used to remove the majority of disk, then the drill was used to remove the posterior osteophyte, expose the posterior longitudinal ligament, and begin the foraminotomies. The nerve hook was used to elevate the posterior longitudinal ligament, which was then removed with Kerrison rongeurs to complete decompression of the spinal cord. The Kerrison rongeurs were then used to complete the foraminotomies bilaterally to decompress the nerve roots. The nerve hook could be passed out each foramen, ensuring decompression of the nerve roots.  Meticulous hemostasis obtained.  Structural allograft was tapped behind the anterior lip of the vertebral body at C3/4 (7 mm).    Please note that the procedure included removal of the disc, removal of the posterior osteophytes, and removal of the posterior longitudinal ligament to ensure decompression of the spinal cord.  Additionally, foraminotomies were performed on both sides of the spinal canal to decompress the nerve roots.  The caspar distractor was removed, and bone wax used for hemostasis. A separate, 18 mm Nuvasive ACP plate was chosen.  Two screws placed in each vertebral body, respectively making sure the screws were behind the locking mechanism.  Final AP and lateral radiographs were taken.   Please note that the plate is not inclusive to the interbody structural allograft.  The anchoring mechanism of the plate is completely separate from the allograft.  The C6-7 level was then addressed. 2 different skin incisions were utilized.  The prevertebral dissection was connected between the 2 sites.  The longus colli were dissected laterally,  and self-retaining  retractors placed to open the operative field. The microscope was then brought into the field.  With this complete, distractor pins were placed in the vertebral bodies of C6 and C7. The distractor was placed, and the annulus at C6/7 was opened using a bovie.  Curettes and pituitary rongeurs used to remove the majority of disk, then the drill was used to remove the posterior osteophyte, expose the posterior longitudinal ligament, and begin the foraminotomies. The nerve hook was used to elevate the posterior longitudinal ligament, which was then removed with Kerrison rongeurs to complete decompression of the spinal cord. The Kerrison rongeurs were then used to complete the foraminotomies bilaterally to decompress the nerve roots. The nerve hook could be passed out each foramen, ensuring decompression of the nerve roots.  Meticulous hemostasis obtained.  Structural allograft was tapped behind the anterior lip of the vertebral body at C6/7 (7 mm).    Please note that the procedure included removal of the disc, removal of the posterior osteophytes, and removal of the posterior longitudinal ligament to ensure decompression of the spinal cord.  Additionally, foraminotomies were performed on both sides of the spinal canal to decompress the nerve roots.  The caspar distractor was removed, and bone wax used for hemostasis. A separate, 16 mm Nuvasive ACP plate was chosen.  Two screws placed in each vertebral body, respectively making sure the screws were behind the locking mechanism.  Final AP and lateral radiographs were taken.   Please note that the plate is not inclusive to the interbody structural allograft.  The anchoring mechanism of the plate is completely separate from the allograft.  A drain was placed.  Please note that 2 different plates were placed.  With everything in good position, the wound was irrigated copiously and meticulous hemostasis obtained.  Wound was closed in 2 layers using interrupted  inverted 3-0 Vicryl sutures.  The wound was dressed with dermabond, the head of bed at 30 degrees, taken to recovery room in stable condition.  No new postop neurological deficits were identified.  Sponge and pattie counts were correct at the end of the procedure. Monitoring was stable throughout.  I performed the entire procedure.   [name] MD

## 2024-08-17 NOTE — H&P (Signed)
 Referring Physician:  No referring provider defined for this encounter.  Primary Physician:  Albina GORMAN Dine, MD  History of Present Illness: 08/17/2024 [name] presents with worsening symptoms of myelopathy.  She had a COPD exacerbation 2 weeks ago around her original surgery date.  She has had improvement in symptoms and was cleared by her pulmonologist.  07/03/2024 [name] is here today with a chief complaint of cervical myelopathy who presents with numbness in her hands and worsening leg weakness. She is accompanied by her boyfriend, who is described as helpful.  Numbness in her fingertips has been present since around Easter, associated with difficulty holding items and frequent dropping. Leg weakness has worsened over the past two to three weeks, leading to increased difficulty with walking and balance. She uses a walking stick regularly but has not experienced any recent falls.  Occasional neck pain is reported. She underwent knee surgery last month, and a scar on her knee affects her reflexes.  She has emphysema and chronic kidney disease, requiring three liters of oxygen  at night along with CPAP. She has a long history of smoking and attempted to quit a couple of weeks ago.   Bowel/Bladder Dysfunction: none  Conservative measures:  Physical therapy:  has not participated in Multimodal medical therapy including regular antiinflammatories: Tylenol , Lidocaine  cream, Gabapentin , Oxycodone   Injections:  05/10/2024-right greater trochanteric bursa steroid injection 02/25/2023-Sacroiliac Joint Steroid Injection   Past Surgery:  Back Surgery -laminectomy and diskectomy L4-5 with fusion and L3-S1 fusion   [name] has clear and progressive symptoms of cervical myelopathy.  The symptoms are causing a significant impact on the patient's life.   I have utilized the care everywhere function in epic to review the outside records available from external health  systems.  Review of Systems:  A 10 point review of systems is negative, except for the pertinent positives and negatives detailed in the HPI.  Past Medical History: Past Medical History:  Diagnosis Date   (HFpEF) heart failure with preserved ejection fraction (HCC)    Anxiety    Aortic atherosclerosis (HCC)    Arthritis    Bilateral hand numbness    CAD (coronary artery disease)    Cervical myelopathy (HCC)    Cervical spinal stenosis    Chest pain at rest    Chronic pain syndrome    Chronic respiratory failure with hypoxia and hypercapnia (HCC)    CKD stage 3b, GFR 30-44 ml/min (HCC)    Community acquired pneumonia 01/10/2013   COPD (chronic obstructive pulmonary disease) (HCC)    DDD (degenerative disc disease), thoracic    Dependence on supplemental oxygen  (3L/[place] qhs PRN)    Depression    DM (diabetes mellitus), type 2 (HCC)    Dyspnea    Failed back surgical syndrome    GERD (gastroesophageal reflux disease)    Hyperlipidemia    Hypertension    Hypothyroid    Lumbar spondylosis    a.) s/p lumbar laminectomy, microdiscectomy, and L4-L5 fusion   Neuropathy    Nicotine  dependence    Normocytic anemia    OSA on CPAP    Pancreatitis 03/2024   Peripheral vascular disease (HCC)    Pulmonary hypertension (HCC)    Recurrent UTI (urinary tract infection)    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  no seizure since--pt does take keppra  and office note from neurologist dr. margaret on this chart   Sepsis due to pneumonia [facility])    Sinus tachycardia    Tobacco abuse    Urinary incontinence    Wound of left leg    being seen at wound clinic as of 03-23-24    Past Surgical History: Past Surgical History:   Procedure Laterality Date   CARPAL TUNNEL RELEASE Left    COLONOSCOPY WITH PROPOFOL  N/A 06/17/2022   Procedure: COLONOSCOPY WITH PROPOFOL ;  Surgeon: Maryruth Ole DASEN, MD;  Location: [facility] ENDOSCOPY;  Service: Endoscopy;  Laterality: N/A;   JOINT REPLACEMENT  08/26/2011   right total knee arthroplasty, left total knee   KNEE ARTHROPLASTY  10/28/2011   Procedure: COMPUTER ASSISTED TOTAL KNEE ARTHROPLASTY;  Surgeon: Norleen CROME Rendall III;  Location: [facility] OR[facility];  Service: Orthopedics;  Laterality: Left;  preop femoral nerve block   LAMINECTOMY AND MICRODISCECTOMY LUMBAR SPINE N/A    Procedure: LUMBAR LAMINECTOMY, MICRODISCECTOMY, AND L4-L5 FUSION   OPEN REDUCTION INTERNAL FIXATION (ORIF) METACARPAL Left 01/30/2024   Procedure: OPEN REDUCTION INTERNAL FIXATION (ORIF) OF LEFT 5TH METACARPAL SHAFT FRACTURE.;  Surgeon: Edie Norleen PARAS, MD;  Location: [facility] ORS;  Service: Orthopedics;  Laterality: Left;   PROCTOSCOPY N/A 06/18/2022   Procedure: RIGID PROCTOSCOPY;  Surgeon: Sheldon Standing, MD;  Location: [facility] OR[facility];  Service: General;  Laterality: N/A;    Allergies: Allergies as of 08/06/2024 - Review Complete 08/03/2024  Allergen Reaction Noted   Nsaids Other (See Comments) 04/19/2011   Thiazide-type diuretics  05/27/2022    Medications:  Current Facility-Administered Medications:    0.9 %  sodium chloride  infusion, , Intravenous, Continuous, Leavy Ned, MD   ceFAZolin  (ANCEF ) IVPB 2g/100 mL premix, 2 g, Intravenous, Once, Clois Fret, MD  Facility-Administered Medications Ordered in Other Encounters:    acetaminophen  (TYLENOL ) tablet 1,000 mg, 1,000 mg, Oral, Once, Vicci, Camellia Glatter, MD   ceFAZolin  (ANCEF ) IVPB 2g/100 mL premix, 2 g, Intravenous, 60 min Pre-Op, Clois Fret, MD   chlorhexidine  (HIBICLENS ) 4 % liquid 4 application, 60 mL, Topical, Once, Duffy, Karin, PA-C   chlorhexidine  (HIBICLENS ) 4 % liquid 4 application, 60 mL, Topical, Once, Duffy, Wonda, PA-C    chlorhexidine  (PERIDEX ) 0.12 % solution 15 mL, 15 mL, Mouth/Throat, Once **OR** Oral care mouth rinse, 15 mL, Mouth Rinse, Once, Piscitello, Fairy POUR, MD  Social History: Social History   Tobacco Use   Smoking status: Every Day    Current packs/day: 2.00    Average packs/day: 2.0 packs/day for 50.0 years (100.0 ttl pk-yrs)    Types: Cigarettes    Passive exposure: Current   Smokeless tobacco: Never  Vaping Use   Vaping status: Every Day   Substances: Nicotine , Flavoring  Substance Use Topics   Alcohol  use: Yes    Comment: rare   Drug use: No    Family Medical History: Family History  Problem Relation Age of Onset   Heart attack Father 2   Alzheimer's disease Mother    Breast cancer Neg Hx     Physical Examination: Vitals:   08/17/24 0801  BP: (!) 164/84  Pulse: 78  Resp: 18  Temp: (!) 97.1 F (36.2 C)  SpO2: 92%    General: Patient is in no apparent distress. Attention to examination is appropriate.  Neck:   Supple.  Full range of motion.  Respiratory: Patient is breathing without any difficulty on oxygen .  Heart sounds normal no MRG. Chest Clear to Auscultation Bilaterally.    NEUROLOGICAL:     Awake, alert, oriented to person, place, and time.  Speech is clear and fluent.   Cranial Nerves: Pupils equal round and reactive to light.  Facial tone is symmetric.  Facial sensation is symmetric. Shoulder shrug is symmetric. Tongue protrusion is midline.  There is no pronator drift.  Strength: Side Biceps Triceps Deltoid Interossei Grip Wrist Ext. Wrist Flex.  R 5 5 4+ 4- 4- 4 4  L 5 5 4+ 4- 4- 4 4   Side Iliopsoas Quads Hamstring PF DF EHL  R 5 5 5 5 5 5   L 5 5 5 5 5 5    Reflexes are 32+ and symmetric at the biceps, triceps, brachioradialis, 1+patella and 2+achilles.   Hoffman's is present.   Bilateral upper and lower extremity sensation is intact to light touch.    No evidence of dysmetria noted.  Gait is wide-based and requires cane.  She cannot  perform tandem gait.     Medical Decision Making  Imaging: MR C spine 06/10/2024  IMPRESSION: 1. Advanced cervical disc and facet degeneration with severe spinal stenosis at C3-4 and C6-7 and moderate spinal stenosis at C4-5 and C5-6. 2. Associated T2 hyperintensity/edema in the spinal cord at C3-4. 3. Severe multilevel neural foraminal stenosis as above. 4. Prominent left facet edema at C3-4 which may be a source of neck pain.     Electronically Signed   By: Dasie Hamburg M.D.   On: 06/10/2024 11:16  I have personally reviewed the images and agree with the above interpretation.  Assessment and Plan: Ms. Tyer is a pleasant 68 y.o. 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  She has stenosis at C4-6 but not as severe.  There is no role for conservative management for cervical myelopathy with myelomalacia.  I have recommended C3/4 and C6/7 ACDF with consideration of posterior decompression if she has continued worsening afterwards.       Mekiah Cambridge K. Clois MD, [facility] Neurosurgery

## 2024-08-17 NOTE — Transfer of Care (Signed)
 Immediate Anesthesia Transfer of Care Note  Patient: [name]  Procedure(s) Performed: ANTERIOR CERVICAL DECOMPRESSION/DISCECTOMY FUSION 2 LEVELS  Patient Location: PACU  Anesthesia Type:General  Level of Consciousness: awake, alert , and oriented  Airway & Oxygen  Therapy: Patient Spontanous Breathing and Patient connected to face mask oxygen   Post-op Assessment: Report given to RN and Post -op Vital signs reviewed and stable  Post vital signs: Reviewed and stable  Last Vitals:  Vitals Value Taken Time  BP 210/94 08/17/24 12:04  Temp 36.3 C 08/17/24 12:02  Pulse 65 08/17/24 12:06  Resp 19 08/17/24 12:06  SpO2 99 % 08/17/24 12:06  Vitals shown include unfiled device data.  Last Pain:  Vitals:   08/17/24 0801  TempSrc: Temporal  PainSc: 10-Worst pain ever      Patients Stated Pain Goal: 0 (08/17/24 0801)  Complications: No notable events documented.

## 2024-08-18 ENCOUNTER — Other Ambulatory Visit: Payer: Self-pay

## 2024-08-18 DIAGNOSIS — I5030 Unspecified diastolic (congestive) heart failure: Secondary | ICD-10-CM | POA: Diagnosis not present

## 2024-08-18 DIAGNOSIS — I130 Hypertensive heart and chronic kidney disease with heart failure and stage 1 through stage 4 chronic kidney disease, or unspecified chronic kidney disease: Secondary | ICD-10-CM | POA: Diagnosis not present

## 2024-08-18 DIAGNOSIS — E039 Hypothyroidism, unspecified: Secondary | ICD-10-CM | POA: Diagnosis not present

## 2024-08-18 DIAGNOSIS — I5032 Chronic diastolic (congestive) heart failure: Secondary | ICD-10-CM | POA: Diagnosis not present

## 2024-08-18 DIAGNOSIS — N1832 Chronic kidney disease, stage 3b: Secondary | ICD-10-CM | POA: Diagnosis not present

## 2024-08-18 DIAGNOSIS — F17210 Nicotine dependence, cigarettes, uncomplicated: Secondary | ICD-10-CM | POA: Diagnosis not present

## 2024-08-18 DIAGNOSIS — M4802 Spinal stenosis, cervical region: Secondary | ICD-10-CM | POA: Diagnosis not present

## 2024-08-18 DIAGNOSIS — E1122 Type 2 diabetes mellitus with diabetic chronic kidney disease: Secondary | ICD-10-CM | POA: Diagnosis not present

## 2024-08-18 DIAGNOSIS — I2510 Atherosclerotic heart disease of native coronary artery without angina pectoris: Secondary | ICD-10-CM | POA: Diagnosis not present

## 2024-08-18 DIAGNOSIS — Z79899 Other long term (current) drug therapy: Secondary | ICD-10-CM | POA: Diagnosis not present

## 2024-08-18 DIAGNOSIS — M4322 Fusion of spine, cervical region: Secondary | ICD-10-CM | POA: Diagnosis not present

## 2024-08-18 DIAGNOSIS — J449 Chronic obstructive pulmonary disease, unspecified: Secondary | ICD-10-CM | POA: Diagnosis not present

## 2024-08-18 MED ORDER — NICOTINE 21 MG/24HR TD PT24
21.0000 mg | MEDICATED_PATCH | Freq: Every day | TRANSDERMAL | Status: DC
  Administered 2024-08-18 – 2024-08-19 (×2): 21 mg via TRANSDERMAL
  Filled 2024-08-18 (×2): qty 1

## 2024-08-18 NOTE — Progress Notes (Signed)
 Neurosurgery visit note Patient seen and examined.  She is doing extremely well this morning no neck pain no neck swelling or tenderness.  Speech is normal she does not have any swallowing difficulty she is ambulating with occupational and physical therapy.  She has a drain in place which put out 50 cc and 60 cc ongoing drainage otherwise notes that she has ongoing bilateral upper extremity numbness.  Her strength in her upper extremities is grossly per her baseline and otherwise doing well tolerating p.o.'s  Physical dam she is awake alert and appropriate her cranial nerves are grossly intact she moves all upper and lower extremities questionable strength sensation.  She has diffuse upper extremity numbness circumferential up to the neck and bilateral upper extremities stable from baseline. Neck nontender nonswollen no redness no drainage drain in place.  Trachea is midline  AP: Overall the patient is doing extremely well like to continue the drain and 1 more day to taper the drainage will plan to remove tomorrow she likely discharge tomorrow following ongoing work with physical and Occupational Therapy   Belvie PARAS. Deatrice, MD Neurosurgery

## 2024-08-18 NOTE — Evaluation (Signed)
 Physical Therapy Evaluation Patient Details Name: [name] MRN: [id number] DOB: 04-19-1956 Today's Date: 08/18/2024  History of Present Illness  68 y/o female s/p C3-4 and C6-7 ACDF on 08/17/24. PMH: COPD, HFpEF, HTN, CAD, T2DM, depression, OSA on CPAP, PVD, seizures  Clinical Impression  Patient admitted following the above procedure. PTA, patient lives alone and was modI with use of SPC. Planning to discharge home with boyfriend to assist. She reports she uses O2 PRN at home, however requires 3L O2 during ambulation to maintain spO2 >89%. Educated patient on cervical precautions and impact on mobility, patient verbalized understanding. Ambulated in hallway with RW and CGA as patient is drifting and staggering L/R. Encouraged use of RW at home for safety and for boyfriend to be with her when she is mobilizing. Patient will benefit from skilled PT services during acute stay to address listed deficits. Patient will benefit from ongoing therapy at discharge to maximize functional independence and safety.         If plan is discharge home, recommend the following: A little help with walking and/or transfers;A little help with bathing/dressing/bathroom;Assistance with cooking/housework;Assist for transportation;Help with stairs or ramp for entrance   Can travel by private vehicle        Equipment Recommendations Rolling Baylei Siebels (2 wheels);BSC/3in1  Recommendations for Other Services       Functional Status Assessment Patient has had a recent decline in their functional status and demonstrates the ability to make significant improvements in function in a reasonable and predictable amount of time.     Precautions / Restrictions Precautions Precautions: Fall;Cervical Precaution Booklet Issued: Yes (comment) Recall of Precautions/Restrictions: Intact Precaution/Restrictions Comments: no brace needed Restrictions Weight Bearing Restrictions Per Provider Order: No      Mobility  Bed  Mobility               General bed mobility comments: in recliner pre and post session    Transfers Overall transfer level: Needs assistance Equipment used: Rolling Roberts Bon (2 wheels) Transfers: Sit to/from Stand Sit to Stand: Contact guard assist                Ambulation/Gait Ambulation/Gait assistance: Contact guard assist Gait Distance (Feet): 200 Feet Assistive device: Rolling Devereaux Grayson (2 wheels) Gait Pattern/deviations: Step-through pattern, Decreased stride length, Drifts right/left, Staggering right, Staggering left Gait velocity: decreased     General Gait Details: CGA for safety as patient drifting and staggering L/R throughout. Patient reporting feeling more off balance than usual  Stairs            Wheelchair Mobility     Tilt Bed    Modified Rankin (Stroke Patients Only)       Balance Overall balance assessment: Needs assistance Sitting-balance support: Feet supported Sitting balance-Leahy Scale: Good     Standing balance support: Bilateral upper extremity supported, During functional activity, Reliant on assistive device for balance Standing balance-Leahy Scale: Poor                               Pertinent Vitals/Pain Pain Assessment Pain Assessment: 0-10 Pain Score: 5  Pain Location: neck Pain Descriptors / Indicators: Discomfort Pain Intervention(s): Limited activity within patient's tolerance, Monitored during session, Repositioned    Home Living Family/patient expects to be discharged to:: Private residence Living Arrangements: Alone (discharge with boyfriend) Available Help at Discharge: Family;Available 24 hours/day Type of Home: Mobile home Home Access: Ramped entrance  Home Layout: One level Home Equipment: Cane - single point      Prior Function Prior Level of Function : Independent/Modified Independent             Mobility Comments: amb with SPC PRN in the house, all the time community  distances ADLs Comments: MOD I-I in ADL, fiance helps with cooking/cleaning as needed; pt drives and manages her own meds     Extremity/Trunk Assessment   Upper Extremity Assessment Upper Extremity Assessment: Defer to OT evaluation RUE Deficits / Details: mild weakness in R hand (dominant hand), numbness/tinging but improved compared to surgery per patient report RUE Coordination: decreased fine motor LUE Deficits / Details: mild weakness in R hand (dominant hand), numbness/tinging but improved compared to surgery per patient report LUE Coordination: decreased fine motor    Lower Extremity Assessment Lower Extremity Assessment: Generalized weakness       Communication   Communication Communication: No apparent difficulties    Cognition Arousal: Alert Behavior During Therapy: WFL for tasks assessed/performed   PT - Cognitive impairments: No apparent impairments                         Following commands: Intact       Cueing       General Comments General comments (skin integrity, edema, etc.): On 3L [place] during ambulation, spO2 >89% throughout. Left on 3L at end of session. Educated patient on need for home O2 use at discharge due to current O2 needs with mobility    Exercises     Assessment/Plan    PT Assessment Patient needs continued PT services  PT Problem List Decreased strength;Decreased activity tolerance;Decreased mobility;Decreased balance;Decreased coordination;Decreased safety awareness;Decreased knowledge of precautions;Decreased knowledge of use of DME;Impaired sensation       PT Treatment Interventions DME instruction;Gait training;Functional mobility training;Therapeutic activities;Therapeutic exercise;Balance training;Neuromuscular re-education;Patient/family education    PT Goals (Current goals can be found in the Care Plan section)  Acute Rehab PT Goals Patient Stated Goal: to go home PT Goal Formulation: With patient Time For Goal  Achievement: 09/01/24 Potential to Achieve Goals: Good    Frequency Min 3X/week     Co-evaluation               AM-PAC PT 6 Clicks Mobility  Outcome Measure Help needed turning from your back to your side while in a flat bed without using bedrails?: A Little Help needed moving from lying on your back to sitting on the side of a flat bed without using bedrails?: A Little Help needed moving to and from a bed to a chair (including a wheelchair)?: A Little Help needed standing up from a chair using your arms (e.g., wheelchair or bedside chair)?: A Little Help needed to walk in hospital room?: A Little Help needed climbing 3-5 steps with a railing? : A Little 6 Click Score: 18    End of Session Equipment Utilized During Treatment: Oxygen  Activity Tolerance: Patient tolerated treatment well Patient left: in chair;with call bell/phone within reach;with chair alarm set;with nursing/sitter in room Nurse Communication: Mobility status PT Visit Diagnosis: Unsteadiness on feet (R26.81);Muscle weakness (generalized) (M62.81);Other abnormalities of gait and mobility (R26.89)    Time: 9142-9088 PT Time Calculation (min) (ACUTE ONLY): 14 min   Charges:   PT Evaluation $PT Eval Low Complexity: 1 Low   PT General Charges $$ ACUTE PT VISIT: 1 Visit         Maryanne Finder, PT, DPT Physical Therapist -  [facility]  [facility]   Altair Appenzeller A Kody Brandl 08/18/2024, 9:56 AM

## 2024-08-18 NOTE — Progress Notes (Signed)
 Patient is not able to walk the distance required to go the bathroom, or he/she is unable to safely negotiate stairs required to access the bathroom.  A 3in1 BSC will alleviate this problem

## 2024-08-18 NOTE — Evaluation (Signed)
 Occupational Therapy Evaluation Patient Details Name: [name] MRN: [id number] DOB: [birth year] Today's Date: 08/18/2024   History of Present Illness   Pt is a 68 year old female s/p C3-4 and C6-7 ACDF    PMH significant for COPD, HTN, OSA     Clinical Impressions Pt seen for OT evaluation this date, POD#1 from above procedure. PTA pt was MOD I- I in ADL, PRN assist for IADL, amb with SPC. She does have oxygen  at home that she wears PRN per her report. Pt educated in cervical precautions, self care skills, AE, and home/routines modifications to maximize safety and functional independence while minimizing falls risk and maintaining precautions. Pt verbalized understanding of all education/training provided. Able to return demonstration safe techniques while maintaining cervical precautions throughout. Handout provided to support recall and carry over of learned precautions/techniques for bed mobility, functional transfers, and self care skills. Pt is performing ADL below PLOF, will benefit from post acute OT to address functional deficits and to facilitate optimal ADL/functional mobility performance. Pt is left in care of PT in chair, all needs met. OT will follow.     If plan is discharge home, recommend the following:   A little help with walking and/or transfers;A little help with bathing/dressing/bathroom;Assist for transportation;Help with stairs or ramp for entrance;Assistance with cooking/housework     Functional Status Assessment   Patient has had a recent decline in their functional status and demonstrates the ability to make significant improvements in function in a reasonable and predictable amount of time.     Equipment Recommendations   BSC/3in1;Other (comment) (2WW)     Recommendations for Other Services         Precautions/Restrictions   Precautions Precautions: Fall;Cervical Recall of Precautions/Restrictions: Intact Precaution/Restrictions Comments: no  brace needed Restrictions Weight Bearing Restrictions Per Provider Order: No     Mobility Bed Mobility Overal bed mobility: Modified Independent             General bed mobility comments: modified log roll technique, provided hand out with additional education re: body mechanics    Transfers Overall transfer level: Needs assistance Equipment used: Rolling walker (2 wheels) Transfers: Sit to/from Stand Sit to Stand: Contact guard assist                  Balance Overall balance assessment: Needs assistance Sitting-balance support: Feet supported Sitting balance-Leahy Scale: Good Sitting balance - Comments: fair intially with posterior lean, improved with vcs for body mechanics   Standing balance support: Bilateral upper extremity supported, During functional activity, Reliant on assistive device for balance Standing balance-Leahy Scale: Fair                             ADL either performed or assessed with clinical judgement   ADL Overall ADL's : Needs assistance/impaired Eating/Feeding: Set up   Grooming: Set up               Lower Body Dressing: Minimal assistance;Sitting/lateral leans Lower Body Dressing Details (indicate cue type and reason): donn pants via figure 4; educated use of AE, pt reports she bought slip in shoes Toilet Transfer: Contact guard assist;Rolling walker (2 wheels);Ambulation Toilet Transfer Details (indicate cue type and reason): simulated, intermittent vcs for technique Toileting- Clothing Manipulation and Hygiene: Supervision/safety;Sitting/lateral lean Toileting - Clothing Manipulation Details (indicate cue type and reason): anticipate     Functional mobility during ADLs: Contact guard assist;Rolling walker (2 wheels) (approx 15'  with intermittent vcs for RW technique)       Vision Patient Visual Report: No change from baseline       Perception         Praxis         Pertinent Vitals/Pain Pain  Assessment Pain Assessment: 0-10 Pain Score: 5  Pain Location: neck Pain Descriptors / Indicators: Discomfort Pain Intervention(s): Monitored during session     Extremity/Trunk Assessment Upper Extremity Assessment Upper Extremity Assessment: Generalized weakness;Right hand dominant;RUE deficits/detail;LUE deficits/detail RUE Deficits / Details: mild weakness in R hand (dominant hand), numbness/tinging but improved compared to surgery per patient report RUE Coordination: decreased fine motor LUE Deficits / Details: mild weakness in R hand (dominant hand), numbness/tinging but improved compared to surgery per patient report LUE Coordination: decreased fine motor   Lower Extremity Assessment Lower Extremity Assessment: Defer to PT evaluation       Communication Communication Communication: No apparent difficulties   Cognition Arousal: Alert Behavior During Therapy: WFL for tasks assessed/performed Cognition: No apparent impairments                               Following commands: Intact       Cueing  General Comments   Cueing Techniques: Verbal cues  Pt on 3L via [place] PRN at home per her report; she asked to trial on RA, spo2 was 95% sitting on edge of bed on RA, 82% after amb to bedside chair; donned 3L and spo2 >90% on 3L, HR in 70s bpm post mobility   Exercises     Shoulder Instructions      Home Living Family/patient expects to be discharged to:: Private residence Living Arrangements: Alone (discharge with boyfriend) Available Help at Discharge: Family;Available 24 hours/day Type of Home: Mobile home Home Access: Ramped entrance     Home Layout: One level     Bathroom Shower/Tub: Producer, television/film/video: Standard Bathroom Accessibility: Yes How Accessible: Accessible via walker Home Equipment: Cane - single point          Prior Functioning/Environment Prior Level of Function : Independent/Modified Independent              Mobility Comments: amb with SPC PRN in the house, all the time community distances ADLs Comments: MOD I-I in ADL, fiance helps with cooking/cleaning as needed; pt drives and manages her own meds    OT Problem List: Decreased strength;Impaired balance (sitting and/or standing);Decreased knowledge of precautions;Decreased activity tolerance;Decreased knowledge of use of DME or AE;Impaired UE functional use   OT Treatment/Interventions: Self-care/ADL training;Therapeutic exercise;Patient/family education;Balance training;Neuromuscular education;Energy conservation;Therapeutic activities;DME and/or AE instruction      OT Goals(Current goals can be found in the care plan section)   Acute Rehab OT Goals Patient Stated Goal: improve function OT Goal Formulation: With patient Time For Goal Achievement: 09/01/24 Potential to Achieve Goals: Good ADL Goals Pt Will Perform Grooming: with modified independence;sitting;standing Pt Will Perform Lower Body Dressing: with modified independence;sitting/lateral leans;sit to/from stand Pt Will Transfer to Toilet: with modified independence;ambulating Pt Will Perform Toileting - Clothing Manipulation and hygiene: with modified independence;sit to/from stand;sitting/lateral leans   OT Frequency:  Min 3X/week    Co-evaluation              AM-PAC OT 6 Clicks Daily Activity     Outcome Measure Help from another person eating meals?: None Help from another person taking care of personal grooming?: None Help  from another person toileting, which includes using toliet, bedpan, or urinal?: None Help from another person bathing (including washing, rinsing, drying)?: A Little Help from another person to put on and taking off regular upper body clothing?: None Help from another person to put on and taking off regular lower body clothing?: A Little 6 Click Score: 22   End of Session Equipment Utilized During Treatment: Rolling walker (2 wheels) Nurse  Communication: Mobility status  Activity Tolerance: Patient tolerated treatment well Patient left: in chair;with call bell/phone within reach;with nursing/sitter in room (in care of PT)  OT Visit Diagnosis: Other abnormalities of gait and mobility (R26.89);Muscle weakness (generalized) (M62.81)                Time: 9159-9094 OT Time Calculation (min): 25 min Charges:  OT General Charges $OT Visit: 1 Visit OT Evaluation $OT Eval Moderate Complexity: 1 Mod  Therisa Sheffield, OTD OTR/L  08/18/24, 9:41 AM

## 2024-08-18 NOTE — Plan of Care (Signed)
  Problem: Clinical Measurements: Goal: Diagnostic test results will improve Outcome: Progressing   Problem: Activity: Goal: Risk for activity intolerance will decrease Outcome: Progressing   Problem: Nutrition: Goal: Adequate nutrition will be maintained Outcome: Progressing   Problem: Pain Managment: Goal: General experience of comfort will improve and/or be controlled Outcome: Progressing   Problem: Activity: Goal: Ability to avoid complications of mobility impairment will improve Outcome: Progressing   Problem: Pain Management: Goal: Pain level will decrease Outcome: Progressing

## 2024-08-18 NOTE — Care Management Obs Status (Signed)
 MEDICARE OBSERVATION STATUS NOTIFICATION   Patient Details  Name: [name] MRN: [id number] Date of Birth: 14-Jan-1956   Medicare Observation Status Notification Given:  Yes    [name] 08/18/2024, 1:27 PM

## 2024-08-18 NOTE — TOC Initial Note (Addendum)
 Transition of Care [facility]) - Initial/Assessment Note    Patient Details  Name: [name] MRN: [id number] Date of Birth: 1956/06/17  [facility] CM/SW Contact:    Domani Bakos E Rockie Schnoor, LCSW Phone Number: 08/18/2024, 11:47 AM  Clinical Narrative:                 CSW spoke with patient regarding therapy recs for HHPT, HHOT, 3in1, and RW. Patient states her boyfriend [name] lives with her and will provide support and transport at DC, confirmed address in chart is correct. PCP is Dr Tejan Sie. Patient is agreeable to therapy recs for [facility] and DME. No agency preferences.  Referral accepted by [facility] with [facility] (as they usually take [facility] patients) for HHPT and OT, notified MD that start of care will be Tuesday per [facility]. Referral made to [facility] with [employer] for bedside delivery of 3in1 and RW today.  12:13- [facility] with [employer] says they have to decline the referral because patient received a RW in 2023 and 3in1 in 2021, so insurance will not cover these items again at this time. CSW notified patient. Patient states she will purchase one privately at the hospice [facility] store.   Expected Discharge Plan: Home w Home Health Services Barriers to Discharge: Barriers Resolved   Patient Goals and CMS Choice   CMS Medicare.gov Compare Post Acute Care list provided to:: Patient Choice offered to / list presented to : Patient      Expected Discharge Plan and Services       Living arrangements for the past 2 months: Single Family Home                 DME Arranged: 3-N-1, Walker rolling DME Agency: [facility] Date DME Agency Contacted: 08/18/24   Representative spoke with at DME Agency: [facility] HH Arranged: PT, OT HH Agency: [facility] Date [facility] Agency Contacted: 08/18/24   Representative spoke with at [facility] Agency: [facility]  Prior Living Arrangements/Services Living arrangements for the past 2 months: Single Family Home Lives with:: Significant Other Patient language and need  for interpreter reviewed:: Yes Do you feel safe going back to the place where you live?: Yes      Need for Family Participation in Patient Care: Yes (Comment) Care giver support system in place?: Yes (comment) Current home services: DME Criminal Activity/Legal Involvement Pertinent to Current Situation/Hospitalization: No - Comment as needed  Activities of Daily Living   ADL Screening (condition at time of admission) Independently performs ADLs?: Yes (appropriate for developmental age) Is the patient deaf or have difficulty hearing?: No Does the patient have difficulty seeing, even when wearing glasses/contacts?: No Does the patient have difficulty concentrating, remembering, or making decisions?: No  Permission Sought/Granted Permission sought to share information with : Facility Industrial/product designer granted to share information with : Yes, Verbal Permission Granted     Permission granted to share info w AGENCY: HH and DME companies        Emotional Assessment       Orientation: : Oriented to Self, Oriented to Place, Oriented to  Time, Oriented to Situation Alcohol  / Substance Use: Not Applicable Psych Involvement: No (comment)  Admission diagnosis:  Cervical myelopathy (HCC) [G95.9] Spinal stenosis of cervical region [M48.02] S/P cervical spinal fusion [Z98.1] Patient Active Problem List   Diagnosis Date Noted   S/P cervical spinal fusion 08/17/2024   Cervical myelopathy (HCC) 08/17/2024   Spinal stenosis of cervical region 08/17/2024   COPD exacerbation (HCC) 07/27/2024  Cervical radiculopathy 04/17/2024   Osteoarthritis of spine with radiculopathy, cervical region 04/17/2024   Pancreatitis, acute 03/28/2024   Pancreatitis 03/28/2024   Acute maxillary sinusitis 11/28/2023   Prediabetes 10/21/2023   Mixed hyperlipidemia 10/21/2023   Onychomycosis 08/10/2023   Back pain 05/24/2023   Cellulitis of left lower extremity 04/26/2023   Infected laceration  04/26/2023   Stage 3b chronic kidney disease (HCC) 04/26/2023   Hyperkalemia 04/26/2023   Sepsis due to pneumonia (HCC) 07/06/2022   Severe sepsis (HCC) 07/06/2022   Hypothyroidism, unspecified 07/06/2022   Normocytic anemia 06/19/2022   Complete rectal prolapse 06/18/2022   Chronic respiratory failure with hypoxia (HCC) 06/18/2022   GERD (gastroesophageal reflux disease) 04/06/2022   Restless leg syndrome 04/06/2022   AKI (acute kidney injury) (HCC) 04/06/2022   Delayed gastric emptying 04/06/2022   Anxiety and depression 01/25/2021   Essential hypertension 01/25/2021   Acquired hypothyroidism 01/25/2021   Acute on chronic respiratory failure with hypoxia (HCC) 11/17/2020   Protein calorie malnutrition (HCC) 11/17/2020   Hypothyroidism 11/17/2020   Leg pain 02/05/2020   Myofascial pain syndrome 08/23/2018   Lumbar spondylosis 08/23/2018   Leg swelling 07/31/2018   Tobacco abuse 07/31/2018   Fusion of lumbar spine (L3-S1) 07/20/2018   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   Lumbar degenerative disc disease 07/20/2018   Chronic pain syndrome 04/18/2014   Respiratory failure (HCC) 04/14/2014   UTI (urinary tract infection) 04/28/2013   Weakness generalized 04/28/2013   Chronic diastolic CHF (congestive heart failure) (HCC) 04/28/2013   Sinus tachycardia 01/11/2013   COPD with acute exacerbation (HCC) 01/10/2013   Diastolic CHF, acute on chronic (HCC) 01/10/2013   Osteoarthritis of left knee 10/27/2011   COPD (chronic obstructive pulmonary disease) (HCC) 03/29/2011   Hypertension 03/29/2011   PCP:  Albina GORMAN Dine, MD Pharmacy:   [facility], [place] - [address] [place] [address] Phone: [phone] Fax: [phone]  [id number] - [place], [place] - [address] [place] [address] Phone: [phone] Fax: [phone]  [id number] - [place], [place] - [address] [place] [address] Phone: [phone] Fax: [phone]  [facility] [address] [place] [address] Phone: [phone] Fax: [phone]     Social Drivers of Health (SDOH) Social History: SDOH Screenings   Food Insecurity: No Food Insecurity (08/17/2024)  Housing: Low Risk  (08/17/2024)  Transportation Needs: No Transportation Needs (08/17/2024)  Utilities: Not At Risk (08/17/2024)  Alcohol  Screen: Low Risk  (12/22/2022)  Depression (PHQ2-9): High Risk (07/12/2024)  Financial Resource Strain: Low Risk  (03/09/2024)   Received from [facility]  Social Connections: Moderately Integrated (08/17/2024)  Tobacco Use: High Risk (08/17/2024)   SDOH Interventions:     Readmission Risk Interventions     No data to display

## 2024-08-19 ENCOUNTER — Other Ambulatory Visit: Payer: Self-pay

## 2024-08-19 DIAGNOSIS — E039 Hypothyroidism, unspecified: Secondary | ICD-10-CM | POA: Diagnosis not present

## 2024-08-19 DIAGNOSIS — I5030 Unspecified diastolic (congestive) heart failure: Secondary | ICD-10-CM | POA: Diagnosis not present

## 2024-08-19 DIAGNOSIS — M4322 Fusion of spine, cervical region: Secondary | ICD-10-CM | POA: Diagnosis not present

## 2024-08-19 DIAGNOSIS — E1122 Type 2 diabetes mellitus with diabetic chronic kidney disease: Secondary | ICD-10-CM | POA: Diagnosis not present

## 2024-08-19 DIAGNOSIS — J449 Chronic obstructive pulmonary disease, unspecified: Secondary | ICD-10-CM | POA: Diagnosis not present

## 2024-08-19 DIAGNOSIS — I2510 Atherosclerotic heart disease of native coronary artery without angina pectoris: Secondary | ICD-10-CM | POA: Diagnosis not present

## 2024-08-19 DIAGNOSIS — Z79899 Other long term (current) drug therapy: Secondary | ICD-10-CM | POA: Diagnosis not present

## 2024-08-19 DIAGNOSIS — I130 Hypertensive heart and chronic kidney disease with heart failure and stage 1 through stage 4 chronic kidney disease, or unspecified chronic kidney disease: Secondary | ICD-10-CM | POA: Diagnosis not present

## 2024-08-19 DIAGNOSIS — N1832 Chronic kidney disease, stage 3b: Secondary | ICD-10-CM | POA: Diagnosis not present

## 2024-08-19 DIAGNOSIS — F17210 Nicotine dependence, cigarettes, uncomplicated: Secondary | ICD-10-CM | POA: Diagnosis not present

## 2024-08-19 DIAGNOSIS — M4802 Spinal stenosis, cervical region: Secondary | ICD-10-CM | POA: Diagnosis not present

## 2024-08-19 DIAGNOSIS — I5032 Chronic diastolic (congestive) heart failure: Secondary | ICD-10-CM | POA: Diagnosis not present

## 2024-08-19 NOTE — Progress Notes (Signed)
 Neurosurgery visit note Patient seen and examined.  He has been doing very well no problems overnight pain is well-controlled she has been up ambulating eating fine.  Denies any change in her voice.  She has no difficulty with swallowing.  Otherwise been doing quite well Physical exam she is awake alert and appropriate her trachea is midline her neck is soft supple nontender no fluid collections no drainage.  I remove the drain in standard clean fashion.  AP: Over the patient do extremely well she can be discharged this morning we will arrange for that to occur and she has follow-up scheduled neurosurgery she was comfortable with this plan  [name]. Rachel Porcaro MD Neurosurgery

## 2024-08-19 NOTE — Plan of Care (Signed)
  Problem: Education: Goal: Knowledge of General Education information will improve Description: Including pain rating scale, medication(s)/side effects and non-pharmacologic comfort measures Outcome: Progressing   Problem: Health Behavior/Discharge Planning: Goal: Ability to manage health-related needs will improve Outcome: Progressing   Problem: Clinical Measurements: Goal: Diagnostic test results will improve Outcome: Progressing   Problem: Activity: Goal: Risk for activity intolerance will decrease Outcome: Progressing   Problem: Coping: Goal: Level of anxiety will decrease Outcome: Progressing   Problem: Activity: Goal: Will remain free from falls Outcome: Progressing

## 2024-08-20 ENCOUNTER — Encounter: Payer: Self-pay | Admitting: Neurosurgery

## 2024-08-20 ENCOUNTER — Telehealth: Payer: Self-pay | Admitting: Physician Assistant

## 2024-08-20 ENCOUNTER — Telehealth: Payer: Self-pay | Admitting: Neurosurgery

## 2024-08-20 LAB — TYPE AND SCREEN
ABO/RH(D): A POS
Antibody Screen: POSITIVE
Unit division: 0
Unit division: 0

## 2024-08-20 LAB — BPAM RBC
Blood Product Expiration Date: 202509212359
Blood Product Expiration Date: 202509222359
Unit Type and Rh: 600
Unit Type and Rh: 600

## 2024-08-20 MED ORDER — METHYLPREDNISOLONE 4 MG PO TBPK
ORAL_TABLET | ORAL | 0 refills | Status: DC
Start: 2024-08-20 — End: 2024-08-29

## 2024-08-20 NOTE — Telephone Encounter (Signed)
 Please see below and advise.     Media Information  Document Information  AMB Correspondence  NEUROSURGERY ANSWERING SERVICE CALL  08/18/2024 09:29  Attached To:  [name]  Source Information  Default, Provider, MD

## 2024-08-20 NOTE — Telephone Encounter (Signed)
 No sexual activity until at least her 2 week follow up.   Recommend adding steroid dose pack to help with pain.   Please find out if she has taken before and let me know.

## 2024-08-20 NOTE — Telephone Encounter (Signed)
 Patient is calling back to let us  know about the oxyCODONE  (OXY IR/ROXICODONE ) 5 MG immediate release tablet not working ( wanting to see if she can get a different [facility] [place]) & needing something stronger. Also, requesting a possible neck brace & is wanting to know when they can engage in sexual intercourse.

## 2024-08-20 NOTE — Telephone Encounter (Signed)
 I spoke with the patient. Her pain is in the back of her neck, on the sides, collar bone, left shoulder, and her jaw is sore.  She is taking oxycodone  10mg  every 3 hours, tylenol , gabapentin  800mg  TID, and methocarbamol  500mg  every 6 hours.  Her pain is not under control with the above regimen.  [facility].  I informed her that we do not do cervical collars for 2 level fusions.  She would like to know when she can resume sexual activity.

## 2024-08-20 NOTE — Telephone Encounter (Signed)
 Brooke spoke with the patient.

## 2024-08-20 NOTE — Telephone Encounter (Signed)
 3 days postop status post C3-4 and C6-7 ACDF.  Patient describes pain in her neck and in between her shoulder blades as well as soreness in her throat.  She denies any difficulty breathing or swallowing.  Plan for Medrol  Dosepak at this time.  Has been sent to her pharmacy.

## 2024-08-23 ENCOUNTER — Other Ambulatory Visit: Payer: Self-pay | Admitting: Physician Assistant

## 2024-08-23 ENCOUNTER — Telehealth: Payer: Self-pay | Admitting: Physician Assistant

## 2024-08-23 MED ORDER — OXYCODONE HCL 5 MG PO TABS: 5.0000 mg | ORAL_TABLET | ORAL | 0 refills | Status: DC | PRN

## 2024-08-23 NOTE — Anesthesia Postprocedure Evaluation (Signed)
 Anesthesia Post Note  Patient: CENIA ZARAGOSA  Procedure(s) Performed: ANTERIOR CERVICAL DECOMPRESSION/DISCECTOMY FUSION 2 LEVELS  Patient location during evaluation: PACU Anesthesia Type: General Level of consciousness: awake and alert Pain management: pain level controlled Vital Signs Assessment: post-procedure vital signs reviewed and stable Respiratory status: spontaneous breathing, nonlabored ventilation, respiratory function stable and patient connected to nasal cannula oxygen  Cardiovascular status: blood pressure returned to baseline and stable Postop Assessment: no apparent nausea or vomiting Anesthetic complications: no   No notable events documented.   Last Vitals:  Vitals:   08/19/24 0459 08/19/24 0728  BP: 132/73 114/62  Pulse: 65 (!) 59  Resp: 18 14  Temp: 36.7 C 36.5 C  SpO2: 95% 100%    Last Pain:  Vitals:   08/19/24 1215  TempSrc:   PainSc: 7                  Prentice Murphy

## 2024-08-23 NOTE — Telephone Encounter (Signed)
 Patient called in to inquire about getting more pain medication. She has not picked up the steroid as well.

## 2024-08-24 ENCOUNTER — Other Ambulatory Visit: Payer: Self-pay | Admitting: Internal Medicine

## 2024-08-24 DIAGNOSIS — E782 Mixed hyperlipidemia: Secondary | ICD-10-CM

## 2024-08-28 ENCOUNTER — Telehealth: Payer: Self-pay | Admitting: Neurosurgery

## 2024-08-28 ENCOUNTER — Other Ambulatory Visit: Payer: Self-pay | Admitting: Physician Assistant

## 2024-08-28 DIAGNOSIS — G959 Disease of spinal cord, unspecified: Secondary | ICD-10-CM

## 2024-08-28 DIAGNOSIS — R2681 Unsteadiness on feet: Secondary | ICD-10-CM

## 2024-08-28 NOTE — Telephone Encounter (Signed)
 Jolena, the patient's social worker is calling to request an order for a shower chair. She has tried to obtain this from the patietn's PCP but has not received a response.   Fax: [phone]

## 2024-08-29 ENCOUNTER — Encounter: Payer: Self-pay | Admitting: Physician Assistant

## 2024-08-29 ENCOUNTER — Ambulatory Visit (INDEPENDENT_AMBULATORY_CARE_PROVIDER_SITE_OTHER): Admitting: Physician Assistant

## 2024-08-29 VITALS — BP 180/72 | Temp 99.3°F | Ht 66.0 in | Wt 161.0 lb

## 2024-08-29 DIAGNOSIS — G959 Disease of spinal cord, unspecified: Secondary | ICD-10-CM

## 2024-08-29 DIAGNOSIS — R2681 Unsteadiness on feet: Secondary | ICD-10-CM

## 2024-08-29 DIAGNOSIS — Z09 Encounter for follow-up examination after completed treatment for conditions other than malignant neoplasm: Secondary | ICD-10-CM

## 2024-08-29 MED ORDER — SENNA 8.6 MG PO TABS: 1.0000 | ORAL_TABLET | Freq: Two times a day (BID) | ORAL | 0 refills | Status: DC | PRN

## 2024-08-29 MED ORDER — OXYCODONE HCL 5 MG PO TABS: 5.0000 mg | ORAL_TABLET | ORAL | 0 refills | Status: DC | PRN

## 2024-08-29 MED ORDER — METHOCARBAMOL 500 MG PO TABS: 500.0000 mg | ORAL_TABLET | Freq: Four times a day (QID) | ORAL | 0 refills | Status: AC | PRN

## 2024-08-29 NOTE — Telephone Encounter (Signed)
 I left [name] a detailed message.

## 2024-08-29 NOTE — Progress Notes (Signed)
   REFERRING PHYSICIAN:  Albina GORMAN Dine, Md [address] [place],  [place] [address]  DOS: 08/17/24, C3-4 and C6-7 ACDF  HISTORY OF PRESENT ILLNESS: [name] is 2 weeks status post C3-4 and C6-7 ACDF. Overall, she is doing well.  She has noticed that the more she uses her hands that they become more numb, but everything is improved compared to baseline preoperatively.     PHYSICAL EXAMINATION:  NEUROLOGICAL:  General: In no acute distress.   Awake, alert, oriented to person, place, and time.  Pupils equal round and reactive to light.  Facial tone is symmetric.    Strength: Side Biceps Triceps Deltoid Interossei Grip Wrist Ext. Wrist Flex.  R 5 5 4+ 5 5 5 5   L 5 4+ 4+ 5 5 5 5    Incision c/d/I  Imaging:  No new imaging since surgery  Assessment / Plan: [name] is doing well after C3-4 and C6-7 ACDF on 08/17/2024. We discussed activity escalation and I have advised the patient to lift up to 10 pounds until 6 weeks after surgery, then increase up to 25 pounds until 12 weeks after surgery.  After 12 weeks post-op, the patient advised to increase activity as tolerated. she will return to clinic in approximately 4 weeks for 6-week postop visit.  -Home health referral placed for the patient as she states that she needs increased help with ADLs. - Refills were requested were given including oxycodone , Robaxin , senna.    Advised to contact the office if any questions or concerns arise.   Lyle Decamp PA-C Dept of Neurosurgery

## 2024-08-31 ENCOUNTER — Telehealth: Payer: Self-pay | Admitting: Physician Assistant

## 2024-08-31 NOTE — Telephone Encounter (Signed)
 Rosina called from [facility] requesting verbal orders   OT evaluation week of sept 22nd   No frequency yet, will need to call back   Verbal OK given to Blank per Almarie, this was OK. Please call Rosina [phone]

## 2024-09-03 ENCOUNTER — Other Ambulatory Visit: Payer: Self-pay | Admitting: Physician Assistant

## 2024-09-03 ENCOUNTER — Telehealth: Payer: Self-pay | Admitting: Neurosurgery

## 2024-09-03 MED ORDER — OXYCODONE HCL 5 MG PO TABS: 5.0000 mg | ORAL_TABLET | ORAL | 0 refills | Status: DC | PRN

## 2024-09-03 NOTE — Telephone Encounter (Signed)
 LMOM informing medication is at the pharmacy.

## 2024-09-03 NOTE — Telephone Encounter (Signed)
 Patient is calling to request a refill of Oxycodone  [facility] [address] [facility] [place].

## 2024-09-03 NOTE — Telephone Encounter (Signed)
 I spoke to patient and she is taking 2 every 4 hours. She states she is still in a lot of pain and doesn't think she can do down to 1 yet.

## 2024-09-03 NOTE — Telephone Encounter (Signed)
 DOS: 08/17/24, C3-4 and C6-7 ACDF   She was given oxycodone  script by Lyle on 08/29/24 with directions of 1-2 q 3 hours prn severe pain.   Please call her and find out how often and how many oxycodone  she is taking.   Would like to get her down to 1 every 4 hours- does she think that would work?   Please send back all PA providers. I am leaving at lunch.

## 2024-09-04 ENCOUNTER — Encounter: Admitting: Neurosurgery

## 2024-09-04 ENCOUNTER — Telehealth: Payer: Self-pay | Admitting: Neurosurgery

## 2024-09-04 NOTE — Telephone Encounter (Signed)
 Tommy with [facility] is calling to let our office know that the patient is rating her pain at a 7/10. He states that she last took pain medication at 6am this morning. He states that she does not have the medication with her and her boyfriend is supposed to be bringing the medication to her house.

## 2024-09-06 DIAGNOSIS — J441 Chronic obstructive pulmonary disease with (acute) exacerbation: Secondary | ICD-10-CM | POA: Diagnosis not present

## 2024-09-06 NOTE — Discharge Summary (Signed)
 Physician Discharge Summary  Patient ID: [name] MRN: [id number] DOB/AGE: 67-Oct-1957 67 y.o.  Admit date: 08/17/2024 Discharge date: 08/19/2024  Admission Diagnoses:   Cervical myelopathy (HCC)   Spinal stenosis of cervical region  Discharge Diagnoses:  Principal Problem:   S/P cervical spinal fusion Active Problems:   Cervical myelopathy (HCC)   Spinal stenosis of cervical region   Discharged Condition: good  Hospital Course: [name] was admitted for surgical intervention.  She tolerated the procedure well and cleared for discharge on POD2.  Consults: None  Significant Diagnostic Studies: radiology: X-Ray: localization for surgery  Treatments: surgery: C3-4 and C6-7 ACDF  Discharge Exam: Blood pressure 114/62, pulse (!) 59, temperature 97.7 F (36.5 C), temperature source Oral, resp. rate 14, height 5' 6 (1.676 m), weight 73 kg, SpO2 100%. General appearance: alert and cooperative MAEW at baseline Neck soft Incision c/d/i  Disposition: Discharge disposition: 01-Home or Self Care        Allergies as of 08/19/2024       Reactions   Nsaids Other (See Comments)   Pt states it messes up her kidneys But takes aspirin  & ibuprofen    Thiazide-type Diuretics Other (See Comments)   Other reaction(s): Due to CKD        Medication List     STOP taking these medications    oxyCODONE  5 MG immediate release tablet Commonly known as: Roxicodone        TAKE these medications    acetaminophen  650 MG CR tablet Commonly known as: TYLENOL  Take 2 tablets (1,300 mg total) by mouth every 8 (eight) hours as needed for pain. Home med.   albuterol  108 (90 Base) MCG/ACT inhaler Commonly known as: VENTOLIN  HFA Inhale 2 puffs into the lungs every 4 (four) hours as needed for wheezing or shortness of breath.   budesonide -glycopyrrolate -formoterol  160-9-4.8 MCG/ACT Aero inhaler Commonly known as: BREZTRI  Inhale 2 puffs into the lungs in the morning and at bedtime.    busPIRone  30 MG tablet Commonly known as: BUSPAR  Take 1 tablet (30 mg total) by mouth 2 (two) times daily.   cloNIDine  0.2 MG tablet Commonly known as: CATAPRES  Take 1 tablet (0.2 mg total) by mouth 2 (two) times daily.   cyanocobalamin  1000 MCG tablet Commonly known as: VITAMIN B12 TAKE 1 TABLET BY MOUTH EVERY DAY *NEW PRESCRIPTION REQUEST*   diltiazem  180 MG 24 hr capsule Commonly known as: CARDIZEM  CD Take 180 mg by mouth every morning.   escitalopram  20 MG tablet Commonly known as: LEXAPRO  Take 1 tablet (20 mg total) by mouth every morning.   fluticasone  50 MCG/ACT nasal spray Commonly known as: FLONASE  Place 1 spray into both nostrils daily as needed for allergies.   gabapentin  800 MG tablet Commonly known as: NEURONTIN  TAKE 1 TABLET BY MOUTH THREE TIMES A DAY   levothyroxine  25 MCG tablet Commonly known as: SYNTHROID  TAKE 1 TABLET BY MOUTH EVERY DAY *NEW PRESCRIPTION REQUEST*   lidocaine -prilocaine cream Commonly known as: EMLA Apply 1 Application topically 4 (four) times daily as needed (pain).   minoxidil  10 MG tablet Commonly known as: LONITEN  Take 1 tablet (10 mg total) by mouth daily.   naphazoline-glycerin  Soln Commonly known as: CLEAR EYES REDNESS Place 1-2 drops into both eyes 4 (four) times daily as needed for eye irritation.   NON FORMULARY Pt uses a c-pap nightly w/ oxygen    olmesartan  40 MG tablet Commonly known as: BENICAR  TAKE 1 TABLET BY MOUTH EVERY DAY   OXYGEN  Inhale 3 L into the  lungs See admin instructions. Inhale 3L of oxygen  throughout the day as needed and at night   pantoprazole  40 MG tablet Commonly known as: PROTONIX  TAKE 1 TABLET BY MOUTH EVERY DAY IN THE MORNING   pramipexole  0.25 MG tablet Commonly known as: MIRAPEX  Take 1 tablet (0.25 mg total) by mouth 3 (three) times daily.   pyridOXINE 100 MG tablet Commonly known as: VITAMIN B6 TAKE 1 TABLET BY MOUTH EVERY DAY *NEW PRESCRIPTION REQUEST*   Vitamin D -3 25 MCG  (1000 UT) Caps Take 1,000 Units by mouth daily.         SignedBETHA [name] 09/06/2024, 11:15 AM

## 2024-09-09 DIAGNOSIS — R2689 Other abnormalities of gait and mobility: Secondary | ICD-10-CM | POA: Diagnosis not present

## 2024-09-09 DIAGNOSIS — S82832A Other fracture of upper and lower end of left fibula, initial encounter for closed fracture: Secondary | ICD-10-CM | POA: Diagnosis not present

## 2024-09-09 DIAGNOSIS — Z8781 Personal history of (healed) traumatic fracture: Secondary | ICD-10-CM | POA: Diagnosis not present

## 2024-09-11 DIAGNOSIS — R2689 Other abnormalities of gait and mobility: Secondary | ICD-10-CM | POA: Diagnosis not present

## 2024-09-11 DIAGNOSIS — N189 Chronic kidney disease, unspecified: Secondary | ICD-10-CM | POA: Diagnosis not present

## 2024-09-13 ENCOUNTER — Telehealth: Payer: Self-pay | Admitting: Neurosurgery

## 2024-09-13 DIAGNOSIS — G4733 Obstructive sleep apnea (adult) (pediatric): Secondary | ICD-10-CM | POA: Diagnosis not present

## 2024-09-13 DIAGNOSIS — Z981 Arthrodesis status: Secondary | ICD-10-CM

## 2024-09-13 DIAGNOSIS — R0602 Shortness of breath: Secondary | ICD-10-CM | POA: Diagnosis not present

## 2024-09-13 DIAGNOSIS — G959 Disease of spinal cord, unspecified: Secondary | ICD-10-CM

## 2024-09-13 DIAGNOSIS — F17210 Nicotine dependence, cigarettes, uncomplicated: Secondary | ICD-10-CM | POA: Diagnosis not present

## 2024-09-13 DIAGNOSIS — J449 Chronic obstructive pulmonary disease, unspecified: Secondary | ICD-10-CM | POA: Diagnosis not present

## 2024-09-13 DIAGNOSIS — Z9981 Dependence on supplemental oxygen: Secondary | ICD-10-CM | POA: Diagnosis not present

## 2024-09-13 MED ORDER — OXYCODONE HCL 5 MG PO TABS: 5.0000 mg | ORAL_TABLET | ORAL | 0 refills | Status: DC | PRN

## 2024-09-13 NOTE — Addendum Note (Signed)
 Addended byBETHA HILMA HASTINGS on: 09/13/2024 04:20 PM   Modules accepted: Orders

## 2024-09-13 NOTE — Telephone Encounter (Addendum)
 DOS: 08/17/24, C3-4 and C6-7 ACDF   Spoke with patient. She is taking about 6 oxycodone  per day. Will change directions to 1 every 4 hours as needed for severe pain.   PMP reviewed and is appropriate.   Refill sent and she is aware.

## 2024-09-13 NOTE — Telephone Encounter (Signed)
 Patient left a voicemail to request a [facility]ill of [facility] [place]

## 2024-09-13 NOTE — Progress Notes (Signed)
 Oxygen  enrichment Adapter piece for cpap ordered with [employer] through [employer].

## 2024-09-17 ENCOUNTER — Telehealth: Payer: Self-pay | Admitting: Neurosurgery

## 2024-09-17 DIAGNOSIS — G959 Disease of spinal cord, unspecified: Secondary | ICD-10-CM

## 2024-09-17 DIAGNOSIS — Z981 Arthrodesis status: Secondary | ICD-10-CM

## 2024-09-17 NOTE — Telephone Encounter (Signed)
 See phone note from answering service.   DOS: 08/17/24, C3-4 and C6-7 ACDF   Please call and find out how much oxycodone  she is taking and how often. When I refilled her script on 10/2, we discussed taking no more than 6 per day.   Please get a list of other medications she is taking for her neck as well.

## 2024-09-17 NOTE — Telephone Encounter (Signed)
 Please see below message and advise.     Media Information  Document Information  AMB Correspondence  NEUROSURGERY ANSWERING SERVICE CALL  09/17/2024 16:42  Attached To:  [name]  Source Information  Default, Provider, MD

## 2024-09-18 MED ORDER — OXYCODONE HCL 5 MG PO TABS
5.0000 mg | ORAL_TABLET | ORAL | 0 refills | Status: DC | PRN
Start: 2024-09-18 — End: 2024-09-25

## 2024-09-18 NOTE — Telephone Encounter (Signed)
 Spoke with the patient and notified her about the prescription and how the prescription will change the next time. She verbalized understanding.

## 2024-09-18 NOTE — Addendum Note (Signed)
 Addended byBETHA HILMA HASTINGS on: 09/18/2024 10:41 AM   Modules accepted: Orders

## 2024-09-18 NOTE — Telephone Encounter (Signed)
 Patient states she is taking: Oxycodone  5 mg 2 tablets every 4 hours-1 tablet does not help her pain but 2 and she has been taking it like this for a while, could not tell me exactly how long.   Tylenol  650 mg 2 tablets every 8 hours Gabapentin  800 mg 1 tablet 3 times daily Methocarbamol  500 mg 1 tablet about once a day only  She does not take anything else for the pain

## 2024-09-18 NOTE — Telephone Encounter (Signed)
 She is s/p ACDF C3-C4 and C6-C7 on 08/17/24.   She was given 5 day supply of oxycodoen on 09/13/24. She would be out today.   She is a month out from surgery and we need to stop weaning her pain medications. Can do one more prescription for 1-2 every 4 hours, but next refill will go back to 1 every 4 hours.   Please let her know.

## 2024-09-18 NOTE — Telephone Encounter (Signed)
 Left a message for the patient to call back.

## 2024-09-20 ENCOUNTER — Other Ambulatory Visit: Payer: Self-pay

## 2024-09-20 DIAGNOSIS — G959 Disease of spinal cord, unspecified: Secondary | ICD-10-CM

## 2024-09-25 ENCOUNTER — Other Ambulatory Visit: Payer: Self-pay | Admitting: Physician Assistant

## 2024-09-25 ENCOUNTER — Telehealth: Payer: Self-pay | Admitting: Physician Assistant

## 2024-09-25 ENCOUNTER — Ambulatory Visit: Admitting: Neurosurgery

## 2024-09-25 ENCOUNTER — Ambulatory Visit (INDEPENDENT_AMBULATORY_CARE_PROVIDER_SITE_OTHER)

## 2024-09-25 VITALS — BP 162/80 | Temp 99.0°F | Ht 66.0 in | Wt 158.0 lb

## 2024-09-25 DIAGNOSIS — M47812 Spondylosis without myelopathy or radiculopathy, cervical region: Secondary | ICD-10-CM | POA: Diagnosis not present

## 2024-09-25 DIAGNOSIS — G959 Disease of spinal cord, unspecified: Secondary | ICD-10-CM

## 2024-09-25 DIAGNOSIS — Z981 Arthrodesis status: Secondary | ICD-10-CM | POA: Diagnosis not present

## 2024-09-25 DIAGNOSIS — Z09 Encounter for follow-up examination after completed treatment for conditions other than malignant neoplasm: Secondary | ICD-10-CM

## 2024-09-25 DIAGNOSIS — Z4789 Encounter for other orthopedic aftercare: Secondary | ICD-10-CM | POA: Diagnosis not present

## 2024-09-25 DIAGNOSIS — M25512 Pain in left shoulder: Secondary | ICD-10-CM

## 2024-09-25 MED ORDER — OXYCODONE HCL 5 MG PO TABS
5.0000 mg | ORAL_TABLET | ORAL | 0 refills | Status: DC | PRN
Start: 2024-09-25 — End: 2024-10-02

## 2024-09-25 NOTE — Progress Notes (Signed)
   REFERRING PHYSICIAN:  Albina GORMAN Dine, Md [address] [place],  [place] [address]  DOS: 08/17/24, C3-4 and C6-7 ACDF  HISTORY OF PRESENT ILLNESS: [name] is status post C3-4 and C6-7 ACDF.   Today she reports improvement in her hand numbness and gait but reports new left shoulder and neck pain that radiates into her upper arm some that she describes as as soreness that is worse with movement of her arm and improves with Oxycodone  use.   PHYSICAL EXAMINATION:  NEUROLOGICAL:  General: In no acute distress.   Awake, alert, oriented to person, place, and time.  Pupils equal round and reactive to light.  Facial tone is symmetric.    Strength: Side Biceps Triceps Deltoid Interossei Grip Wrist Ext. Wrist Flex.  R 5 5 4+ 5 5 5 5   L 5 4+ 4+ 5 5 5 5    Incision c/d/I  MSK: TTP over left anterior shoulder. Positive empty can on the left.   Imaging:  09/25/24 cervical xrays Without evidence of hardware malfunction  Assessment / Plan: ABYGAIL GALENO is doing better after C3-4 and C6-7 ACDF on 08/17/2024.  She can resume activities as tolerated. I recommended referral to orthopedics for further evaluation of her left shoulder as I suspect she may have an underlying rotator cuff injury. I have placed a referral for this. We will see her back in 6 weeks with cervical xrays prior.  Dr. Clois saw this patient as well and agrees with this assessment and plan.  Edsel Goods PA-C Dept of Neurosurgery

## 2024-09-25 NOTE — Telephone Encounter (Signed)
 Date10/14/25 Provider FRISBIE Med Requesting oxyCODONE  (OXY IR/ROXICODONE ) 5 MG immediate release tablet [facility] [place]  Patient contact [phone]

## 2024-09-26 ENCOUNTER — Ambulatory Visit

## 2024-09-26 ENCOUNTER — Other Ambulatory Visit: Payer: Self-pay

## 2024-09-26 DIAGNOSIS — R52 Pain, unspecified: Secondary | ICD-10-CM

## 2024-09-26 DIAGNOSIS — M25512 Pain in left shoulder: Secondary | ICD-10-CM

## 2024-09-26 DIAGNOSIS — M7552 Bursitis of left shoulder: Secondary | ICD-10-CM

## 2024-09-26 DIAGNOSIS — M19012 Primary osteoarthritis, left shoulder: Secondary | ICD-10-CM | POA: Diagnosis not present

## 2024-09-26 DIAGNOSIS — Z981 Arthrodesis status: Secondary | ICD-10-CM | POA: Diagnosis not present

## 2024-09-26 MED ORDER — TRIAMCINOLONE ACETONIDE 40 MG/ML IJ SUSP
40.0000 mg | INTRAMUSCULAR | Status: AC | PRN
  Administered 2024-09-26: 40 mg via INTRA_ARTICULAR

## 2024-09-26 NOTE — Progress Notes (Signed)
 Orthopaedic Surgery New Patient Visit   History of Present Illness: The patient is a 68 y.o. female seen in clinic for 4 to 5-week history of left shoulder pain.  States located on the superior and lateral aspect of left shoulder.  Began a few days after cervical spine ACDF procedure.  Surgery was performed on 08/17/2024.  Patient states presurgery bilateral hand numbness is improving.  Patient states prior to surgery did not have left shoulder pain.  States shoulder pain symptoms are present constantly.  Improves with prescription oxycodone .  Rates as a 5 out of 10 in severity.  Describes as a dull ache.  Associated clicking/popping in the neck.  Has applied ice/heat, has taken over-the-counter Tylenol , and received a few sessions of physical therapy for her neck with no improvement.  Patient denies left arm weakness.  Patient is right-hand dominant.    Patient is currently retired.  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  Patient states recently she has not been participating in any hobbies due to pain.  Patient denies any previous history of shoulder injection or cervical epidural steroid injection.  Denies any previous history of left shoulder issues.  Patient seen yesterday [facility] Neurosurgery at [facility], 09/25/2024, by Edsel Goods PA-C.  Pain located in her left shoulder and left side of her neck, with radiation into her left upper arm.  Describes a soreness.  Exacerbated with any movement of her left upper extremity.  Patient currently being managed by neurosurgery for C3-C4 and C6-C7 ACDF performed on 08/17/2024.  Referral placed to orthopedics for evaluation of possible rotator cuff injury.  Patient with orthopedic history significant for metacarpal ORIF as well as bilateral total knee arthroplasty.  Patient regularly followed by PC, Dr. Cinderella Perry MD with [facility]. Last BUN 23, Cr 1.02, and GFR >60 on 07/28/24 (previous history of CKD Stage 3b). Last A1c 5.4  on 10/11/2023 (one year ago).    Past Medical, Social and Family History: Past Medical History:  Diagnosis Date   (HFpEF) heart failure with preserved ejection fraction (HCC)    Anxiety    Aortic atherosclerosis    Arthritis    Bilateral hand numbness    CAD (coronary artery disease)    Cervical myelopathy (HCC)    Cervical spinal stenosis    Chest pain at rest    Chronic pain syndrome    Chronic respiratory failure with hypoxia and hypercapnia (HCC)    CKD stage 3b, GFR 30-44 ml/min (HCC)    Community acquired pneumonia 01/10/2013   COPD (chronic obstructive pulmonary disease) (HCC)    DDD (degenerative disc disease), thoracic    Dependence on supplemental oxygen  (3L/[place] qhs PRN)    Depression    DM (diabetes mellitus), type 2 (HCC)    Dyspnea    Failed back surgical syndrome    GERD (gastroesophageal reflux disease)    Hyperlipidemia    Hypertension    Hypothyroid    Lumbar spondylosis    a.) s/p lumbar laminectomy, microdiscectomy, and L4-L5 fusion   Neuropathy    Nicotine  dependence    Normocytic anemia    OSA on CPAP    Pancreatitis 03/2024   Peripheral vascular disease    Pulmonary hypertension (HCC)    Recurrent UTI (urinary tract infection)    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  no seizure since--pt does take keppra  and office note from neurologist dr. margaret on this chart   Sepsis due to pneumonia [facility])    Sinus tachycardia    Tobacco abuse    Urinary incontinence    Wound of left leg    being seen at wound clinic as of 03-23-24   Past Surgical History:  Procedure Laterality Date   ANTERIOR CERVICAL DECOMP/DISCECTOMY FUSION N/A 08/17/2024   Procedure: ANTERIOR CERVICAL  DECOMPRESSION/DISCECTOMY FUSION 2 LEVELS;  Surgeon: Clois Fret, MD;  Location: [facility] ORS;  Service: Neurosurgery;  Laterality: N/A;  C3-4 & C6-7 ANTERIOR CERVICAL DISCECTOMY AND FUSION   CARPAL TUNNEL RELEASE Left    COLONOSCOPY WITH PROPOFOL  N/A 06/17/2022   Procedure: COLONOSCOPY WITH PROPOFOL ;  Surgeon: Maryruth Ole DASEN, MD;  Location: [facility] ENDOSCOPY;  Service: Endoscopy;  Laterality: N/A;   JOINT REPLACEMENT  08/26/2011   right total knee arthroplasty, left total knee   KNEE ARTHROPLASTY  10/28/2011   Procedure: COMPUTER ASSISTED TOTAL KNEE ARTHROPLASTY;  Surgeon: Norleen CROME Rendall III;  Location: [facility] ORS;  Service: Orthopedics;  Laterality: Left;  preop femoral nerve block   LAMINECTOMY AND MICRODISCECTOMY LUMBAR SPINE N/A    Procedure: LUMBAR LAMINECTOMY, MICRODISCECTOMY, AND L4-L5 FUSION   OPEN REDUCTION INTERNAL FIXATION (ORIF) METACARPAL Left 01/30/2024   Procedure: OPEN REDUCTION INTERNAL FIXATION (ORIF) OF LEFT 5TH METACARPAL SHAFT FRACTURE.;  Surgeon: Edie Norleen PARAS, MD;  Location: [facility] ORS;  Service: Orthopedics;  Laterality: Left;   PROCTOSCOPY N/A 06/18/2022   Procedure: RIGID PROCTOSCOPY;  Surgeon: Sheldon Standing, MD;  Location: [facility] ORS;  Service: General;  Laterality: N/A;   Allergies  Allergen Reactions   Nsaids Other (See Comments)    Pt states it messes up her kidneys But takes aspirin  & ibuprofen    Thiazide-Type Diuretics Other (See Comments)    Other reaction(s): Due to CKD   Current Outpatient Medications on File Prior to Visit  Medication Sig Dispense Refill   acetaminophen  (TYLENOL ) 650 MG CR tablet Take 2 tablets (1,300 mg total) by mouth every 8 (eight) hours as needed for pain. Home med.     albuterol  (VENTOLIN  HFA) 108 (90 Base) MCG/ACT inhaler Inhale 2 puffs into the lungs every 4 (four) hours as needed for wheezing or shortness of breath. 18 g 11   budesonide -glycopyrrolate -formoterol  (BREZTRI ) 160-9-4.8 MCG/ACT AERO inhaler Inhale 2 puffs into the lungs  in the morning and at bedtime.     busPIRone  (BUSPAR ) 30 MG tablet Take 1 tablet (30 mg total) by mouth 2 (two) times daily. 180 tablet 1   Cholecalciferol  (VITAMIN D -3) 25 MCG (1000 UT) CAPS Take 1,000 Units by mouth daily.     cloNIDine  (CATAPRES ) 0.2 MG tablet Take 1 tablet (0.2 mg total) by mouth 2 (two) times daily. 180 tablet 0   cyanocobalamin  (VITAMIN B12) 1000 MCG tablet TAKE 1 TABLET BY MOUTH EVERY DAY *NEW PRESCRIPTION REQUEST* 90 tablet 3   diltiazem  (CARDIZEM  CD) 180 MG 24 hr capsule Take 180 mg by mouth every morning.     escitalopram  (LEXAPRO ) 20 MG tablet Take 1 tablet (20 mg total) by mouth every morning.     fenofibrate  micronized (LOFIBRA) 134 MG capsule TAKE 1 CAPSULE BY MOUTH EVERY DAY BEFORE BREAKFAST 90 capsule 0   fluticasone  (FLONASE ) 50 MCG/ACT nasal spray Place 1 spray into both nostrils daily as needed for  allergies.     gabapentin  (NEURONTIN ) 800 MG tablet TAKE 1 TABLET BY MOUTH THREE TIMES A DAY 270 tablet 0   levothyroxine  (SYNTHROID ) 25 MCG tablet TAKE 1 TABLET BY MOUTH EVERY DAY *NEW PRESCRIPTION REQUEST* 90 tablet 1   lidocaine -prilocaine (EMLA) cream Apply 1 Application topically 4 (four) times daily as needed (pain).     methocarbamol  (ROBAXIN ) 500 MG tablet Take 1 tablet (500 mg total) by mouth every 6 (six) hours as needed for muscle spasms. 120 tablet 0   minoxidil  (LONITEN ) 10 MG tablet Take 1 tablet (10 mg total) by mouth daily. 30 tablet 2   naphazoline-glycerin  (CLEAR EYES REDNESS) 0.012-0.25 % SOLN Place 1-2 drops into both eyes 4 (four) times daily as needed for eye irritation.     NON FORMULARY Pt uses a c-pap nightly w/ oxygen      olmesartan  (BENICAR ) 40 MG tablet TAKE 1 TABLET BY MOUTH EVERY DAY 90 tablet 0   oxyCODONE  (OXY IR/ROXICODONE ) 5 MG immediate release tablet Take 1-2 tablets (5-10 mg total) by mouth every 4 (four) hours as needed for up to 7 days for severe pain (pain score 7-10). 40 tablet 0   OXYGEN  Inhale 3 L into the lungs See admin  instructions. Inhale 3L of oxygen  throughout the day as needed and at night     pantoprazole  (PROTONIX ) 40 MG tablet TAKE 1 TABLET BY MOUTH EVERY DAY IN THE MORNING 90 tablet 1   pramipexole  (MIRAPEX ) 0.25 MG tablet Take 1 tablet (0.25 mg total) by mouth 3 (three) times daily. 270 tablet 1   pyridOXINE (VITAMIN B6) 100 MG tablet TAKE 1 TABLET BY MOUTH EVERY DAY *NEW PRESCRIPTION REQUEST* 90 tablet 3   Varenicline  Tartrate, Starter, 0.5 MG X 11 & 1 MG X 42 TBPK See admin instructions. follow package directions (Patient not taking: Reported on 09/25/2024)     Current Facility-Administered Medications on File Prior to Visit  Medication Dose Route Frequency Provider Last Rate Last Admin   acetaminophen  (TYLENOL ) tablet 1,000 mg  1,000 mg Oral Once Vicci Camellia Glatter, MD       chlorhexidine  (HIBICLENS ) 4 % liquid 4 application  60 mL Topical Once Duffy, Karin, PA-C       chlorhexidine  (HIBICLENS ) 4 % liquid 4 application  60 mL Topical Once Duffy, Karin, PA-C       chlorhexidine  (PERIDEX ) 0.12 % solution 15 mL  15 mL Mouth/Throat Once Piscitello, Fairy POUR, MD       Or   Oral care mouth rinse  15 mL Mouth Rinse Once Piscitello, Fairy POUR, MD       Social History   Tobacco Use   Smoking status: Every Day    Current packs/day: 2.00    Average packs/day: 2.0 packs/day for 50.0 years (100.0 ttl pk-yrs)    Types: Cigarettes    Passive exposure: Current   Smokeless tobacco: Never  Vaping Use   Vaping status: Every Day   Substances: Nicotine , Flavoring  Substance Use Topics   Alcohol  use: Yes    Comment: rare   Drug use: No      I have reviewed past medical, surgical, social and family history, medications and allergies as documented in the EMR.  Review of Systems - A ROS was performed including pertinent positives and negatives as documented in the HPI.     Physical Exam:  General/Constitutional: NAD Vascular: No edema, swelling or tenderness, except as noted in detailed  exam Integumentary: 3 skin tears present about the left  upper extremity including proximal forearm, distal forearm and dorsal hand Neuro/Psych: Normal mood and affect, oriented to person, place and time Musculoskeletal: Normal, except as noted in detailed exam and in HPI   Focused examination:   Shoulder focused exam:  Skin is intact about the left shoulder. No obvious deformity, ecchymosis or erythema present.    RIGHT LEFT  Scapula Atrophy Negative Negative   Winging Negative Negative  Rotator cuff Supraspinatus 5/5 5/5   Infraspinatus 5/5 5/5   Subscapularis 5/5 5/5  AROM/PROM (degrees) FF 0-140 / 0-160 0-140 / 0-160   ER0 0-60 / 0-60 0-60 / 0-60   IR(back) T12 / T12 T10 / T10  Palpation (pain): AC Not tested positive   Biceps Not tested positive   Coracoid Not tested negative  Special Tests: O'Briens Not tested negative   Cross body Adduction Not tested positive   Speeds  Not tested negative   Jobe's Not tested negative   Neer Not tested positive   Hawkins Not tested positive   Belly Press Not tested negative  Other: Lateral deltoid 5/5 5/5    Vascular/Lymphatic: Fingers warm and well perfused with 2+ radial pulse.    Neurologic: Sensation intact to the Median, Ulnar and Radial nerve distribution of the hand. Sensation intact to lateral deltoid (axillary nerve).      XR Left Shoulder Imaging: X-rays of the Left shoulder including 3-views obtained today 09/26/2024 at [facility]surgery at [facility] were reviewed personally by me and reviewed with patient.  Per my independent interpretation these images show no fractures or dislocation.  Mild degenerative changes noted about the acromioclavicular joint with mild downward sloping acromion.  Calcific changes noted about the greater tuberosity.   Assessment:  Left shoulder bursitis History of ACDF-9/25  Plan:  Patient was seen and examined in office today. We reviewed patient's history, examination,  and imaging in detail. Based on information available for this encounter, patient is likely symptomatic from left shoulder bursitis.  She noticed symptoms after resolution of pain and numbness following ACDF in September of this year.  She is bothered by overhead activity and reaching across her body. She also has pain in end range motion. We discussed the natural course of this injury process and treatment measures moving forward.  We believe patient would benefit from formal physical therapy to address range of motion, stretching and strengthening modalities.  Patient has difficulty with transportation, therefore we have recommended home health as a suitable option.  Having recently undergone ACDF, advised in referral to avoid cervical traction or other modalities around the neck that may be contraindicated following ACDF surgery, rather focus should be on left shoulder bursitis exercises.  We also discussed subacromial injection to aid in inflammation reduction.  Patient would like to proceed with the injection today.  We will plan on seeing patient back in 6 weeks for repeat evaluation.  Follow-up with neurosurgery as previously scheduled  Patient also with 3 skin tears on the left upper extremity.  Local wound care was provided and areas were dressed with dry dressings.  Patient advised to clean skin tear sites daily with soap and water and apply dry dressing until healed.  Education provided.   All questions, concerns and comments were addressed to the best of my ability.  Follow-up: 6 weeks   Arlyss GEANNIE Schneider, DO Orthopedic Surgery & Sports Medicine [place]   This document was dictated using Dragon voice recognition software. A reasonable attempt at proof reading has been made  to minimize errors.   Left shoulder subacromial injection procedure: The risks and benefits of a cortisone injection were discussed and the patient wishes to proceed.  The anatomic landmarks of the left shoulder  were palpated.  The shoulder was cleansed with alcohol  swabs and ChloraPrep.  Using sterile technique, a 21 gauge needle was introduced into the posterior subacromial space.  After aspiration revealed no blood the injectate which consisted of 1cc of 40 mg/mL of Kenalog  and 4cc of 0.5% ropivacaine  was injected into the area.  The needle was withdrawn and pressure was applied for hemostasis.  A bandage was applied.  The patient tolerated the procedure well and was told to ice the area tonight if it is sore.  Cortisone Injections You have received a cortisone injection today. This injection consists of a numbing medication and cortisone.  There may be side effects after receiving the injection.   If you are diabetic: Your blood sugar may increase for up to 48 hours.  Check it more often than normal.   General reactions: A cortisone flare reaction. This generally occurs 6-8 hours after receiving the injection.  Ice the area and take Tylenol  for pain.  If the pain lasts longer than 48 hours call the office.   Some patients may experience flushing, increased heart rate, red face or increase in body temperature.  This is rare but can happen.   Over the counter Benadryl , if appropriate, often reduces these symptoms.  For a severe reaction contact your family doctor or go to the emergency room.  If you have any other questions, feel free to ask.   Large Joint Inj: L subacromial bursa on 09/26/2024 5:05 PM Indications: pain Details: (21 G) 1.5 in needle, posterior approach Medications: 40 mg triamcinolone  acetonide 40 MG/ML (4 cc Ropivacaine  0.5%) Outcome: tolerated well, no immediate complications Procedure, treatment alternatives, risks and benefits explained, specific risks discussed. Patient was prepped and draped in the usual sterile fashion.

## 2024-09-26 NOTE — Patient Instructions (Signed)

## 2024-09-28 ENCOUNTER — Telehealth: Payer: Self-pay | Admitting: Physician Assistant

## 2024-09-28 NOTE — Telephone Encounter (Signed)
 Date10/17/25 Provider FRISBIE Med Requesting Oxycodone  5mg  Pharmacy [facility] [address] [place], [place]  Patient contact [phone]

## 2024-09-28 NOTE — Telephone Encounter (Signed)
 Called patient to check-in. Patient seen in office on Wed. 09/26/24. At that time, patient had left upper extremity skin tears. Wounds were cleaned and bandaged. Patient reports today that wounds are healing well. Steristrips still on place on hand. Patient replaced xeroform and kling wrap on forearm with bandaids. Denies pain, bleeding, edema, erythema. Advised patient to call [facility] office if any worsening symptoms or concerns.

## 2024-09-28 NOTE — Progress Notes (Signed)
 Patient ID: [name], female   DOB: [birth year], 68 y.o.   MRN: [id number] [facility] visit

## 2024-09-28 NOTE — Progress Notes (Signed)
 Patient ID: [name], female   DOB: 15-Apr-1956, 68 y.o.   MRN: [id number] [facility]A

## 2024-10-01 ENCOUNTER — Ambulatory Visit: Admitting: Internal Medicine

## 2024-10-01 ENCOUNTER — Telehealth: Payer: Self-pay

## 2024-10-01 NOTE — Telephone Encounter (Signed)
 Patient LM stating that she's got a stomach virus and she's asking for something to be called in for  nausea

## 2024-10-02 ENCOUNTER — Telehealth: Payer: Self-pay | Admitting: Physician Assistant

## 2024-10-02 ENCOUNTER — Other Ambulatory Visit: Payer: Self-pay | Admitting: Physician Assistant

## 2024-10-02 ENCOUNTER — Other Ambulatory Visit: Payer: Self-pay

## 2024-10-02 DIAGNOSIS — Z981 Arthrodesis status: Secondary | ICD-10-CM

## 2024-10-02 DIAGNOSIS — G959 Disease of spinal cord, unspecified: Secondary | ICD-10-CM

## 2024-10-02 MED ORDER — OXYCODONE HCL 5 MG PO TABS: 5.0000 mg | ORAL_TABLET | ORAL | 0 refills | Status: DC | PRN

## 2024-10-02 MED ORDER — ONDANSETRON 4 MG PO TBDP: 4.0000 mg | ORAL_TABLET | Freq: Four times a day (QID) | ORAL | 0 refills | Status: AC

## 2024-10-02 NOTE — Telephone Encounter (Signed)
 Patient is requesting a refill on her oxycodone .   Pharmacy - [facility] [place] [address] [place].

## 2024-10-02 NOTE — Telephone Encounter (Signed)
 Left VM to return call

## 2024-10-02 NOTE — Telephone Encounter (Signed)
 Patient notified

## 2024-10-02 NOTE — Telephone Encounter (Signed)
 Patient informed and sent locally

## 2024-10-03 ENCOUNTER — Ambulatory Visit (INDEPENDENT_AMBULATORY_CARE_PROVIDER_SITE_OTHER)

## 2024-10-03 ENCOUNTER — Other Ambulatory Visit: Payer: Self-pay | Admitting: Internal Medicine

## 2024-10-03 DIAGNOSIS — M7061 Trochanteric bursitis, right hip: Secondary | ICD-10-CM | POA: Diagnosis not present

## 2024-10-03 DIAGNOSIS — M47816 Spondylosis without myelopathy or radiculopathy, lumbar region: Secondary | ICD-10-CM

## 2024-10-03 NOTE — Patient Instructions (Signed)

## 2024-10-03 NOTE — Progress Notes (Signed)
 Orthopaedic Surgery Established Patient Visit   History of Present Illness: The patient is a 68 y.o. female seen in clinic for right lateral hip pain.  She was previously seen in the office last week for her left shoulder pain.  She reports her right hip symptoms have been ongoing for at least a year.  She notes the symptoms are most painful over the lateral aspect of the thigh.  Describes as a burning sensation isolated to the lateral thigh.  Denies groin pain.  Reports her symptoms have been on and off over the last year.  No known inciting injury or trauma.  She was seen at [facility] clinic where she was given a greater trochanteric bursal injection which only provided about a month of relief.  She does not recall when this injection was provided.  She has also tried prescription narcotics, ice, heat and topical IcyHot.  Her symptoms are most prominent when she is walking and seated.  She does not have pain when laying on the right side.  Patient does ambulate with a cane at baseline in the right hand.      Past Medical, Social and Family History: Past Medical History:  Diagnosis Date   (HFpEF) heart failure with preserved ejection fraction (HCC)    Anxiety    Aortic atherosclerosis    Arthritis    Bilateral hand numbness    CAD (coronary artery disease)    Cervical myelopathy (HCC)    Cervical spinal stenosis    Chest pain at rest    Chronic pain syndrome    Chronic respiratory failure with hypoxia and hypercapnia (HCC)    CKD stage 3b, GFR 30-44 ml/min (HCC)    Community acquired pneumonia 01/10/2013   COPD (chronic obstructive pulmonary disease) (HCC)    DDD (degenerative disc disease), thoracic    Dependence on supplemental oxygen  (3L/[place] qhs PRN)    Depression    DM (diabetes mellitus), type 2 (HCC)    Dyspnea    Failed back surgical syndrome    GERD (gastroesophageal reflux disease)    Hyperlipidemia    Hypertension    Hypothyroid    Lumbar spondylosis    a.) s/p lumbar  laminectomy, microdiscectomy, and L4-L5 fusion   Neuropathy    Nicotine  dependence    Normocytic anemia    OSA on CPAP    Pancreatitis 03/2024   Peripheral vascular disease    Pulmonary hypertension (HCC)    Recurrent UTI (urinary tract infection)    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  no seizure since--pt does take keppra  and office note from neurologist dr. margaret on this chart   Sepsis due to pneumonia [facility])    Sinus tachycardia    Tobacco abuse    Urinary incontinence    Wound of left leg    being seen at wound clinic as of 03-23-24   Past Surgical History:  Procedure Laterality Date   ANTERIOR CERVICAL DECOMP/DISCECTOMY FUSION N/A 08/17/2024   Procedure: ANTERIOR CERVICAL DECOMPRESSION/DISCECTOMY FUSION 2 LEVELS;  Surgeon: Clois Fret, MD;  Location: [facility] ORS;  Service: Neurosurgery;  Laterality: N/A;  C3-4 & C6-7 ANTERIOR CERVICAL DISCECTOMY AND FUSION   CARPAL TUNNEL RELEASE Left  COLONOSCOPY WITH PROPOFOL  N/A 06/17/2022   Procedure: COLONOSCOPY WITH PROPOFOL ;  Surgeon: Maryruth Ole DASEN, MD;  Location: [facility] ENDOSCOPY;  Service: Endoscopy;  Laterality: N/A;   JOINT REPLACEMENT  08/26/2011   right total knee arthroplasty, left total knee   KNEE ARTHROPLASTY  10/28/2011   Procedure: COMPUTER ASSISTED TOTAL KNEE ARTHROPLASTY;  Surgeon: Norleen CROME Rendall III;  Location: [facility] ORS;  Service: Orthopedics;  Laterality: Left;  preop femoral nerve block   LAMINECTOMY AND MICRODISCECTOMY LUMBAR SPINE N/A    Procedure: LUMBAR LAMINECTOMY, MICRODISCECTOMY, AND L4-L5 FUSION   OPEN REDUCTION INTERNAL FIXATION (ORIF) METACARPAL Left 01/30/2024   Procedure: OPEN REDUCTION INTERNAL FIXATION (ORIF) OF LEFT 5TH METACARPAL  SHAFT FRACTURE.;  Surgeon: Edie Norleen PARAS, MD;  Location: [facility] ORS;  Service: Orthopedics;  Laterality: Left;   PROCTOSCOPY N/A 06/18/2022   Procedure: RIGID PROCTOSCOPY;  Surgeon: Sheldon Standing, MD;  Location: [facility] ORS;  Service: General;  Laterality: N/A;   Allergies  Allergen Reactions   Nsaids Other (See Comments)    Pt states it messes up her kidneys But takes aspirin  & ibuprofen    Thiazide-Type Diuretics Other (See Comments)    Other reaction(s): Due to CKD   Current Outpatient Medications on File Prior to Visit  Medication Sig Dispense Refill   ondansetron  (ZOFRAN -ODT) 4 MG disintegrating tablet Take 1 tablet (4 mg total) by mouth every 6 (six) hours. 30 tablet 0   acetaminophen  (TYLENOL ) 650 MG CR tablet Take 2 tablets (1,300 mg total) by mouth every 8 (eight) hours as needed for pain. Home med.     albuterol  (VENTOLIN  HFA) 108 (90 Base) MCG/ACT inhaler Inhale 2 puffs into the lungs every 4 (four) hours as needed for wheezing or shortness of breath. 18 g 11   budesonide -glycopyrrolate -formoterol  (BREZTRI ) 160-9-4.8 MCG/ACT AERO inhaler Inhale 2 puffs into the lungs in the morning and at bedtime.     busPIRone  (BUSPAR ) 30 MG tablet Take 1 tablet (30 mg total) by mouth 2 (two) times daily. 180 tablet 1   Cholecalciferol  (VITAMIN D -3) 25 MCG (1000 UT) CAPS Take 1,000 Units by mouth daily.     cloNIDine  (CATAPRES ) 0.2 MG tablet Take 1 tablet (0.2 mg total) by mouth 2 (two) times daily. 180 tablet 0   cyanocobalamin  (VITAMIN B12) 1000 MCG tablet TAKE 1 TABLET BY MOUTH EVERY DAY *NEW PRESCRIPTION REQUEST* 90 tablet 3   diltiazem  (CARDIZEM  CD) 180 MG 24 hr capsule Take 180 mg by mouth every morning.     escitalopram  (LEXAPRO ) 20 MG tablet Take 1 tablet (20 mg total) by mouth every morning.     fenofibrate  micronized (LOFIBRA) 134 MG capsule TAKE 1 CAPSULE BY MOUTH EVERY DAY BEFORE BREAKFAST 90 capsule 0   fluticasone  (FLONASE ) 50 MCG/ACT nasal spray Place 1 spray into both nostrils daily as  needed for allergies.     levothyroxine  (SYNTHROID ) 25 MCG tablet TAKE 1 TABLET BY MOUTH EVERY DAY *NEW PRESCRIPTION REQUEST* 90 tablet 1   lidocaine -prilocaine (EMLA) cream Apply 1 Application topically 4 (four) times daily as needed (pain).     methocarbamol  (ROBAXIN ) 500 MG tablet Take 1 tablet (500 mg total) by mouth every 6 (six) hours as needed for muscle spasms. 120 tablet 0   minoxidil  (LONITEN ) 10 MG tablet Take 1 tablet (10 mg total) by mouth daily. 30 tablet 2   naphazoline-glycerin  (CLEAR EYES REDNESS) 0.012-0.25 % SOLN Place 1-2 drops into both eyes 4 (four) times daily as needed for eye irritation.     NON FORMULARY Pt  uses a c-pap nightly w/ oxygen      olmesartan  (BENICAR ) 40 MG tablet TAKE 1 TABLET BY MOUTH EVERY DAY 90 tablet 0   oxyCODONE  (OXY IR/ROXICODONE ) 5 MG immediate release tablet Take 1-2 tablets (5-10 mg total) by mouth every 4 (four) hours as needed for up to 7 days for severe pain (pain score 7-10). 40 tablet 0   OXYGEN  Inhale 3 L into the lungs See admin instructions. Inhale 3L of oxygen  throughout the day as needed and at night     pantoprazole  (PROTONIX ) 40 MG tablet TAKE 1 TABLET BY MOUTH EVERY DAY IN THE MORNING 90 tablet 1   pramipexole  (MIRAPEX ) 0.25 MG tablet Take 1 tablet (0.25 mg total) by mouth 3 (three) times daily. 270 tablet 1   pyridOXINE (VITAMIN B6) 100 MG tablet TAKE 1 TABLET BY MOUTH EVERY DAY *NEW PRESCRIPTION REQUEST* 90 tablet 3   Varenicline  Tartrate, Starter, 0.5 MG X 11 & 1 MG X 42 TBPK See admin instructions. follow package directions (Patient not taking: Reported on 09/25/2024)     Current Facility-Administered Medications on File Prior to Visit  Medication Dose Route Frequency Provider Last Rate Last Admin   acetaminophen  (TYLENOL ) tablet 1,000 mg  1,000 mg Oral Once Vicci Camellia Glatter, MD       chlorhexidine  (HIBICLENS ) 4 % liquid 4 application  60 mL Topical Once Duffy, Karin, PA-C       chlorhexidine  (HIBICLENS ) 4 % liquid 4  application  60 mL Topical Once Duffy, Karin, PA-C       chlorhexidine  (PERIDEX ) 0.12 % solution 15 mL  15 mL Mouth/Throat Once Piscitello, Fairy POUR, MD       Or   Oral care mouth rinse  15 mL Mouth Rinse Once Piscitello, Fairy POUR, MD       Social History   Tobacco Use   Smoking status: Every Day    Current packs/day: 2.00    Average packs/day: 2.0 packs/day for 50.0 years (100.0 ttl pk-yrs)    Types: Cigarettes    Passive exposure: Current   Smokeless tobacco: Never  Vaping Use   Vaping status: Every Day   Substances: Nicotine , Flavoring  Substance Use Topics   Alcohol  use: Yes    Comment: rare   Drug use: No      I have reviewed past medical, surgical, social and family history, medications and allergies as documented in the EMR.  Review of Systems - A ROS was performed including pertinent positives and negatives as documented in the HPI.     Physical Exam:  General/Constitutional: NAD Vascular: No edema, swelling or tenderness, except as noted in detailed exam Integumentary: No impressive skin lesions present, except as noted in detailed exam Neuro/Psych: Normal mood and affect, oriented to person, place and time Musculoskeletal: Normal, except as noted in detailed exam and in HPI   Focused examination:  On focused examination of the right hip, no obvious deformity noted.  Prior midline lumbar incision intact.  Nontender to palpation about the midline spine.  Mild tenderness to palpation about the right paraspinal musculature.  Minimal tenderness about the right gluteal region and piriformis.  Point tenderness over the right greater trochanter.  Patient with smooth arc of motion in the hip with the leg flexed to 90 degrees in internal and external rotation without pain.  5 out of 5 motor strength in hip flexion, knee flexion, knee extension, ankle plantarflexion and ankle dorsiflexion.  Sensation intact to light touch distally about the right lower  extremity.  Lower  extremity is warm and well-perfused.  Negative straight leg raise on the right.    XR pelvis imaging: X-rays of the AP pelvis were obtained on 01/21/2024 at [facility] and were available for my review today.  Radiographs were reviewed with patient as well.  Per my interpretation, there are no fractures or dislocations.  Prior hardware noted within the lower lumbar spine.  Sclerosis noted about the pubic symphysis.  Mild to moderate degenerative changes about bilateral hips.    Radiology Read: FINDINGS: There is no evidence of acute pelvic fracture or diastasis. No pelvic bone lesions are seen. Postoperative changes are seen throughout the visualized portion of the lumbar spine. Ill-defined surgical sutures are seen overlying the left hemipelvis.   IMPRESSION: No acute osseous abnormality.  Assessment:  Right hip greater trochanteric bursitis  Plan:  Patient was seen and examined in office today. We reviewed patient's history, examination, and imaging in detail. Based on information available for this encounter, patient likely symptomatic from right hip greater trochanteric bursitis.  She does have point tenderness over the right lateral hip.  No obvious intra-articular hip pathology based on history and exam findings.  Her ambulation with a cane in the right hand may be contributing to flareup of her symptoms.  She did have a prior injection in the greater trochanteric region which only helped for short period.  She is not interested in a repeat injection today.  We discussed additional treatment measures today including formal physical therapy.  She is interested and would like to try this for symptom relief.  We will plan on seeing patient back in 8-10 weeks for repeat evaluation of her right hip and left shoulder.   Patient education material was provided.  All questions, concerns and comments were addressed to the best of my ability.  Follow-up: 8-10 weeks   Arlyss GEANNIE Schneider,  DO Orthopedic Surgery & Sports Medicine [place]   This document was dictated using Dragon voice recognition software. A reasonable attempt at proof reading has been made to minimize errors.

## 2024-10-04 ENCOUNTER — Telehealth: Payer: Self-pay

## 2024-10-04 ENCOUNTER — Other Ambulatory Visit: Payer: Self-pay

## 2024-10-04 DIAGNOSIS — F3289 Other specified depressive episodes: Secondary | ICD-10-CM

## 2024-10-04 DIAGNOSIS — E782 Mixed hyperlipidemia: Secondary | ICD-10-CM

## 2024-10-04 NOTE — Telephone Encounter (Signed)
 Patient needs appt to see Dr Laymon, she's called complaining of elevated bp and heart beat in her head

## 2024-10-05 ENCOUNTER — Ambulatory Visit (INDEPENDENT_AMBULATORY_CARE_PROVIDER_SITE_OTHER): Admitting: Internal Medicine

## 2024-10-05 VITALS — BP 140/64 | HR 91 | Temp 98.9°F | Ht 66.0 in | Wt 160.0 lb

## 2024-10-05 DIAGNOSIS — I10 Essential (primary) hypertension: Secondary | ICD-10-CM | POA: Diagnosis not present

## 2024-10-05 MED ORDER — FENOFIBRATE MICRONIZED 134 MG PO CAPS: 134.0000 mg | ORAL_CAPSULE | Freq: Every day | ORAL | 1 refills | Status: DC

## 2024-10-05 MED ORDER — CLONIDINE HCL 0.3 MG PO TABS: 0.3000 mg | ORAL_TABLET | Freq: Two times a day (BID) | ORAL | 0 refills | Status: AC

## 2024-10-05 MED ORDER — ESCITALOPRAM OXALATE 20 MG PO TABS: 20.0000 mg | ORAL_TABLET | ORAL | 0 refills | Status: DC

## 2024-10-05 NOTE — Progress Notes (Signed)
 Established Patient Office Visit  Subjective:  Patient ID: [name], female    DOB: March 18, 1956  Age: 68 y.o. MRN: [id number]  Chief Complaint  Patient presents with   Acute Visit    Elevated bp and heart rate.    Recent fall out of bed and sustained lacerations to her right hand and forearm. Denies head injury. Here today for elevated bp. C/o worsening anxiety and palpitations.    No other concerns at this time.   Past Medical History:  Diagnosis Date   (HFpEF) heart failure with preserved ejection fraction (HCC)    Anxiety    Aortic atherosclerosis    Arthritis    Bilateral hand numbness    CAD (coronary artery disease)    Cervical myelopathy (HCC)    Cervical spinal stenosis    Chest pain at rest    Chronic pain syndrome    Chronic respiratory failure with hypoxia and hypercapnia (HCC)    CKD stage 3b, GFR 30-44 ml/min (HCC)    Community acquired pneumonia 01/10/2013   COPD (chronic obstructive pulmonary disease) (HCC)    DDD (degenerative disc disease), thoracic    Dependence on supplemental oxygen  (3L/ qhs PRN)    Depression    DM (diabetes mellitus), type 2 (HCC)    Dyspnea    Failed back surgical syndrome    GERD (gastroesophageal reflux disease)    Hyperlipidemia    Hypertension    Hypothyroid    Lumbar spondylosis    a.) Makensie Mulhall/p lumbar laminectomy, microdiscectomy, and L4-L5 fusion   Neuropathy    Nicotine  dependence    Normocytic anemia    OSA on CPAP    Pancreatitis 03/2024   Peripheral vascular disease    Pulmonary hypertension (HCC)    Recurrent UTI (urinary tract infection)    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  no seizure since--pt does take keppra  and office note from  neurologist dr. margaret on this chart   Sepsis due to pneumonia [facility])    Sinus tachycardia    Tobacco abuse    Urinary incontinence    Wound of left leg    being seen at wound clinic as of 03-23-24    Past Surgical History:  Procedure Laterality Date   ANTERIOR CERVICAL DECOMP/DISCECTOMY FUSION N/A 08/17/2024   Procedure: ANTERIOR CERVICAL DECOMPRESSION/DISCECTOMY FUSION 2 LEVELS;  Surgeon: Clois Fret, MD;  Location: [facility] ORS;  Service: Neurosurgery;  Laterality: N/A;  C3-4 & C6-7 ANTERIOR CERVICAL DISCECTOMY AND FUSION   CARPAL TUNNEL RELEASE Left    COLONOSCOPY WITH PROPOFOL  N/A 06/17/2022   Procedure: COLONOSCOPY WITH PROPOFOL ;  Surgeon: Maryruth Ole DASEN, MD;  Location: [facility] ENDOSCOPY;  Service: Endoscopy;  Laterality: N/A;   JOINT REPLACEMENT  08/26/2011   right total knee arthroplasty, left total knee   KNEE ARTHROPLASTY  10/28/2011   Procedure: COMPUTER ASSISTED TOTAL KNEE ARTHROPLASTY;  Surgeon: Norleen CROME Rendall III;  Location: [facility] ORS;  Service: Orthopedics;  Laterality: Left;  preop femoral nerve block   LAMINECTOMY AND MICRODISCECTOMY LUMBAR SPINE N/A    Procedure: LUMBAR LAMINECTOMY, MICRODISCECTOMY, AND L4-L5 FUSION   OPEN REDUCTION INTERNAL FIXATION (ORIF) METACARPAL Left 01/30/2024   Procedure: OPEN REDUCTION INTERNAL FIXATION (  ORIF) OF LEFT 5TH METACARPAL SHAFT FRACTURE.;  Surgeon: Edie Norleen PARAS, MD;  Location: [facility] ORS;  Service: Orthopedics;  Laterality: Left;   PROCTOSCOPY N/A 06/18/2022   Procedure: RIGID PROCTOSCOPY;  Surgeon: Sheldon Standing, MD;  Location: [facility] ORS;  Service: General;  Laterality: N/A;    Social History   Socioeconomic History   Marital status: Widowed    Spouse name: Not on file   Number of children: Not on file   Years of education: Not on file   Highest education level: Not on file  Occupational History   Not on file  Tobacco Use   Smoking status: Every Day    Current packs/day: 2.00    Average packs/day: 2.0 packs/day for 50.0  years (100.0 ttl pk-yrs)    Types: Cigarettes    Passive exposure: Current   Smokeless tobacco: Never  Vaping Use   Vaping status: Every Day   Substances: Nicotine , Flavoring  Substance and Sexual Activity   Alcohol  use: Yes    Comment: rare   Drug use: No   Sexual activity: Yes    Birth control/protection: Post-menopausal  Other Topics Concern   Not on file  Social History Narrative   ** Merged History Encounter **       Social Drivers of Health   Financial Resource Strain: Low Risk  (03/09/2024)   Received from [facility]   Overall Financial Resource Strain (CARDIA)    Difficulty of Paying Living Expenses: Not hard at all  Food Insecurity: No Food Insecurity (08/17/2024)   Hunger Vital Sign    Worried About Running Out of Food in the Last Year: Never true    Ran Out of Food in the Last Year: Never true  Transportation Needs: No Transportation Needs (08/17/2024)   PRAPARE - Administrator, Civil Service (Medical): No    Lack of Transportation (Non-Medical): No  Physical Activity: Not on file  Stress: Not on file  Social Connections: Moderately Integrated (08/17/2024)   Social Connection and Isolation Panel    Frequency of Communication with Friends and Family: Three times a week    Frequency of Social Gatherings with Friends and Family: More than three times a week    Attends Religious Services: More than 4 times per year    Active Member of Clubs or Organizations: Yes    Attends Banker Meetings: More than 4 times per year    Marital Status: Widowed  Intimate Partner Violence: Not At Risk (08/17/2024)   Humiliation, Afraid, Rape, and Kick questionnaire    Fear of Current or Ex-Partner: No    Emotionally Abused: No    Physically Abused: No    Sexually Abused: No    Family History  Problem Relation Age of Onset   Heart attack Father 55   Alzheimer'Uzma Hellmer disease Mother    Breast cancer Neg Hx     Allergies  Allergen Reactions    Nsaids Other (See Comments)    Pt states it messes up her kidneys But takes aspirin  & ibuprofen    Thiazide-Type Diuretics Other (See Comments)    Other reaction(Rayn Enderson): Due to CKD    Outpatient Medications Prior to Visit  Medication Sig   acetaminophen  (TYLENOL ) 650 MG CR tablet Take 2 tablets (1,300 mg total) by mouth every 8 (eight) hours as needed for pain. Home med.   albuterol  (VENTOLIN  HFA) 108 (90 Base) MCG/ACT inhaler Inhale 2 puffs into the lungs every 4 (four) hours as needed for  wheezing or shortness of breath.   budesonide -glycopyrrolate -formoterol  (BREZTRI ) 160-9-4.8 MCG/ACT AERO inhaler Inhale 2 puffs into the lungs in the morning and at bedtime.   busPIRone  (BUSPAR ) 30 MG tablet Take 1 tablet (30 mg total) by mouth 2 (two) times daily.   Cholecalciferol  (VITAMIN D -3) 25 MCG (1000 UT) CAPS Take 1,000 Units by mouth daily.   cyanocobalamin  (VITAMIN B12) 1000 MCG tablet TAKE 1 TABLET BY MOUTH EVERY DAY *NEW PRESCRIPTION REQUEST*   diltiazem  (CARDIZEM  CD) 180 MG 24 hr capsule Take 180 mg by mouth every morning.   escitalopram  (LEXAPRO ) 20 MG tablet Take 1 tablet (20 mg total) by mouth every morning.   fenofibrate  micronized (LOFIBRA) 134 MG capsule Take 1 capsule (134 mg total) by mouth daily before breakfast.   fluticasone  (FLONASE ) 50 MCG/ACT nasal spray Place 1 spray into both nostrils daily as needed for allergies.   gabapentin  (NEURONTIN ) 800 MG tablet TAKE 1 TABLET BY MOUTH THREE TIMES DAILY   levothyroxine  (SYNTHROID ) 25 MCG tablet TAKE 1 TABLET BY MOUTH EVERY DAY *NEW PRESCRIPTION REQUEST*   lidocaine -prilocaine (EMLA) cream Apply 1 Application topically 4 (four) times daily as needed (pain).   methocarbamol  (ROBAXIN ) 500 MG tablet Take 1 tablet (500 mg total) by mouth every 6 (six) hours as needed for muscle spasms.   minoxidil  (LONITEN ) 10 MG tablet Take 1 tablet (10 mg total) by mouth daily.   naphazoline-glycerin  (CLEAR EYES REDNESS) 0.012-0.25 % SOLN Place 1-2 drops into  both eyes 4 (four) times daily as needed for eye irritation.   olmesartan  (BENICAR ) 40 MG tablet TAKE 1 TABLET BY MOUTH EVERY DAY   ondansetron  (ZOFRAN -ODT) 4 MG disintegrating tablet Take 1 tablet (4 mg total) by mouth every 6 (six) hours.   oxyCODONE  (OXY IR/ROXICODONE ) 5 MG immediate release tablet Take 1-2 tablets (5-10 mg total) by mouth every 4 (four) hours as needed for up to 7 days for severe pain (pain score 7-10).   OXYGEN  Inhale 3 L into the lungs See admin instructions. Inhale 3L of oxygen  throughout the day as needed and at night   pantoprazole  (PROTONIX ) 40 MG tablet TAKE 1 TABLET BY MOUTH EVERY DAY IN THE MORNING   pramipexole  (MIRAPEX ) 0.25 MG tablet Take 1 tablet (0.25 mg total) by mouth 3 (three) times daily.   pyridOXINE (VITAMIN B6) 100 MG tablet TAKE 1 TABLET BY MOUTH EVERY DAY *NEW PRESCRIPTION REQUEST*   Varenicline  Tartrate, Starter, 0.5 MG X 11 & 1 MG X 42 TBPK See admin instructions. follow package directions   [DISCONTINUED] cloNIDine  (CATAPRES ) 0.2 MG tablet Take 1 tablet (0.2 mg total) by mouth 2 (two) times daily.   NON FORMULARY Pt uses a c-pap nightly w/ oxygen  (Patient not taking: Reported on 10/05/2024)   Facility-Administered Medications Prior to Visit  Medication Dose Route Frequency Provider   acetaminophen  (TYLENOL ) tablet 1,000 mg  1,000 mg Oral Once Vicci Camellia Glatter, MD   chlorhexidine  (HIBICLENS ) 4 % liquid 4 application  60 mL Topical Once Duffy, Karin, PA-C   chlorhexidine  (HIBICLENS ) 4 % liquid 4 application  60 mL Topical Once Duffy, Karin, PA-C   chlorhexidine  (PERIDEX ) 0.12 % solution 15 mL  15 mL Mouth/Throat Once Piscitello, Fairy POUR, MD   Or   Oral care mouth rinse  15 mL Mouth Rinse Once Piscitello, Fairy POUR, MD    ROS     Objective:   BP (!) 140/64   Pulse 91   Temp 98.9 F (37.2 C)   Ht 5' 6 (1.676 m)  Wt 160 lb (72.6 kg)   SpO2 (!) 89%   BMI 25.82 kg/m   Vitals:   10/05/24 1543  BP: (!) 140/64  Pulse: 91  Temp:  98.9 F (37.2 C)  Height: 5' 6 (1.676 m)  Weight: 160 lb (72.6 kg)  SpO2: (!) 89%  BMI (Calculated): 25.84    Physical Exam Vitals reviewed.  Constitutional:      General: She is not in acute distress.    Comments:    HENT:     Head: Normocephalic.     Nose: Nose normal.     Mouth/Throat:     Mouth: Mucous membranes are moist.  Eyes:     Extraocular Movements: Extraocular movements intact.     Pupils: Pupils are equal, round, and reactive to light.  Cardiovascular:     Rate and Rhythm: Normal rate and regular rhythm.     Heart sounds: No murmur heard. Pulmonary:     Effort: Pulmonary effort is normal.     Breath sounds: Decreased air movement present. No rhonchi or rales.  Abdominal:     General: Abdomen is flat.     Palpations: There is no hepatomegaly, splenomegaly or mass.     Tenderness: There is no guarding or rebound.  Musculoskeletal:        General: Normal range of motion.     Right hand: Swelling and deformity present.     Cervical back: Normal range of motion. No tenderness.  Skin:    General: Skin is warm and dry.     Comments: Left leg wound dressed  Neurological:     General: No focal deficit present.     Mental Status: She is alert and oriented to person, place, and time.     Cranial Nerves: No cranial nerve deficit.     Motor: No weakness.  Psychiatric:        Mood and Affect: Mood normal.        Behavior: Behavior normal.      No results found for any visits on 10/05/24.      Assessment & Plan:  Saydee was seen today for acute visit.  Essential (primary) hypertension -     cloNIDine  HCl; Take 1 tablet (0.3 mg total) by mouth 2 (two) times daily.  Dispense: 60 tablet; Refill: 0    Problem List Items Addressed This Visit   None Visit Diagnoses       Essential (primary) hypertension    -  Primary   Relevant Medications   cloNIDine  (CATAPRES ) 0.3 MG tablet       Return in about 2 weeks (around 10/19/2024) for BP followup, awv with  labs prior.   Total time spent: 20 minutes. This time includes review of previous notes and results and patient face to face interaction during today'Urijah Raynor visit.    Sherrill Cinderella Perry, MD  10/05/2024   This document may have been prepared by [facility] Voice Recognition software and as such may include unintentional dictation errors.

## 2024-10-07 ENCOUNTER — Other Ambulatory Visit: Payer: Self-pay | Admitting: Internal Medicine

## 2024-10-07 DIAGNOSIS — I10 Essential (primary) hypertension: Secondary | ICD-10-CM

## 2024-10-08 ENCOUNTER — Telehealth: Payer: Self-pay | Admitting: Physician Assistant

## 2024-10-08 DIAGNOSIS — Z981 Arthrodesis status: Secondary | ICD-10-CM

## 2024-10-08 DIAGNOSIS — G4733 Obstructive sleep apnea (adult) (pediatric): Secondary | ICD-10-CM | POA: Diagnosis not present

## 2024-10-08 DIAGNOSIS — G959 Disease of spinal cord, unspecified: Secondary | ICD-10-CM

## 2024-10-08 MED ORDER — OXYCODONE HCL 5 MG PO TABS: 5.0000 mg | ORAL_TABLET | ORAL | 0 refills | Status: DC | PRN

## 2024-10-08 NOTE — Telephone Encounter (Signed)
 Date 10/08/24 Provider LUNA Med Requesting Oxycodone  5mg  [facility] [place]  Patient contact [phone]

## 2024-10-08 NOTE — Telephone Encounter (Signed)
 DOS: 08/17/24, C3-4 and C6-7 ACDF   Last oxycodone  prescription was 1-2 q 4 hours prn. Says 7 day supply, but if she was taking 2 q 4 hours then she would be out.   PMP reviewed and is appropriate.   Refill of oxycodone  sent to [facility] [place]), but let her know the directions were changed to 1 every 4 hours prn severe pain.

## 2024-10-08 NOTE — Telephone Encounter (Signed)
 Patient notified and voiced understanding.

## 2024-10-09 DIAGNOSIS — R2689 Other abnormalities of gait and mobility: Secondary | ICD-10-CM | POA: Diagnosis not present

## 2024-10-09 DIAGNOSIS — S82832A Other fracture of upper and lower end of left fibula, initial encounter for closed fracture: Secondary | ICD-10-CM | POA: Diagnosis not present

## 2024-10-09 DIAGNOSIS — Z8781 Personal history of (healed) traumatic fracture: Secondary | ICD-10-CM | POA: Diagnosis not present

## 2024-10-11 ENCOUNTER — Encounter: Admitting: Orthopedic Surgery

## 2024-10-15 ENCOUNTER — Encounter: Payer: Self-pay | Admitting: Radiology

## 2024-10-15 ENCOUNTER — Telehealth: Payer: Self-pay | Admitting: Physician Assistant

## 2024-10-15 ENCOUNTER — Other Ambulatory Visit: Payer: Self-pay | Admitting: Physician Assistant

## 2024-10-15 DIAGNOSIS — Z981 Arthrodesis status: Secondary | ICD-10-CM

## 2024-10-15 DIAGNOSIS — G959 Disease of spinal cord, unspecified: Secondary | ICD-10-CM

## 2024-10-15 MED ORDER — OXYCODONE HCL 5 MG PO TABS: 5.0000 mg | ORAL_TABLET | Freq: Four times a day (QID) | ORAL | 0 refills | Status: DC | PRN

## 2024-10-15 NOTE — Telephone Encounter (Signed)
 Prescription Request  10/15/2024  LOV: 09/25/2024  What is the name of the medication or equipment? oxyCODONE  (OXY IR/ROXICODONE ) 5 MG immediate release tablet   Have you contacted your pharmacy to request a refill? No   Which pharmacy would you like this sent to?  [facility] - [address], [place] - [address] [place] [address] Phone: [phone] Fax: [phone]  [id number] [place], [place] - [address] [place] [address] Phone: [phone] Fax: [phone]  [id number] - [address], [place] - [address] [place] [address] Phone: [phone] Fax: [phone]  [facility] [place]  - [place] - [address] [place] [address] Phone: [phone] Fax: [phone]  [id number] [place], [place] - [address] AT [facility] [address] [place] [address] Phone: [phone] Fax: [phone]    Patient notified that their request is being sent to the clinical staff for review and that they should receive a response within 2 business days.   Please advise at [facility] [phone]

## 2024-10-15 NOTE — Telephone Encounter (Signed)
 Left detailed message.

## 2024-10-22 ENCOUNTER — Other Ambulatory Visit: Payer: Self-pay | Admitting: Physician Assistant

## 2024-10-22 ENCOUNTER — Telehealth: Payer: Self-pay | Admitting: Physician Assistant

## 2024-10-22 DIAGNOSIS — G959 Disease of spinal cord, unspecified: Secondary | ICD-10-CM

## 2024-10-22 DIAGNOSIS — Z981 Arthrodesis status: Secondary | ICD-10-CM

## 2024-10-22 MED ORDER — OXYCODONE HCL 5 MG PO TABS: 5.0000 mg | ORAL_TABLET | Freq: Four times a day (QID) | ORAL | 0 refills | Status: DC | PRN

## 2024-10-22 NOTE — Telephone Encounter (Signed)
 Patient is calling to request a refill of Oxycodone  [facility] [place].

## 2024-10-23 ENCOUNTER — Ambulatory Visit: Admitting: Internal Medicine

## 2024-10-31 ENCOUNTER — Other Ambulatory Visit: Payer: Self-pay | Admitting: Internal Medicine

## 2024-10-31 DIAGNOSIS — G2581 Restless legs syndrome: Secondary | ICD-10-CM

## 2024-11-02 ENCOUNTER — Other Ambulatory Visit: Payer: Self-pay | Admitting: Physician Assistant

## 2024-11-02 DIAGNOSIS — G959 Disease of spinal cord, unspecified: Secondary | ICD-10-CM

## 2024-11-04 ENCOUNTER — Other Ambulatory Visit: Payer: Self-pay | Admitting: Internal Medicine

## 2024-11-04 DIAGNOSIS — I10 Essential (primary) hypertension: Secondary | ICD-10-CM

## 2024-11-06 ENCOUNTER — Ambulatory Visit: Admission: RE | Admit: 2024-11-06 | Source: Ambulatory Visit | Admitting: Neurosurgery

## 2024-11-06 ENCOUNTER — Encounter: Admitting: Physician Assistant

## 2024-11-06 ENCOUNTER — Other Ambulatory Visit

## 2024-11-07 ENCOUNTER — Ambulatory Visit

## 2024-11-09 ENCOUNTER — Other Ambulatory Visit: Payer: Self-pay | Admitting: Internal Medicine

## 2024-11-09 DIAGNOSIS — F3289 Other specified depressive episodes: Secondary | ICD-10-CM

## 2024-11-12 ENCOUNTER — Ambulatory Visit

## 2024-11-12 ENCOUNTER — Ambulatory Visit: Admitting: Physician Assistant

## 2024-11-12 VITALS — BP 150/98 | Temp 99.3°F | Ht 66.0 in | Wt 160.0 lb

## 2024-11-12 DIAGNOSIS — Z981 Arthrodesis status: Secondary | ICD-10-CM

## 2024-11-12 DIAGNOSIS — G959 Disease of spinal cord, unspecified: Secondary | ICD-10-CM | POA: Diagnosis not present

## 2024-11-12 NOTE — Progress Notes (Signed)
   REFERRING PHYSICIAN:  Albina GORMAN Dine, Md [address] [place],  [place] [address]  DOS: 08/17/24, C3-4 and C6-7 ACDF  HISTORY OF PRESENT ILLNESS: JELITZA MANNINEN is status post C3-4 and C6-7 ACDF approximately 3 months ago.  She continues to have neck and left shoulder pain.  She also continues to have myelopathic symptoms and issues with balance and her gait.  She unfortunately has not started physical therapy yet.  She has been seeing a pain management group for her medications.  No new symptoms.  No new weakness or numbness and tingling.     PHYSICAL EXAMINATION:  NEUROLOGICAL:  General: In no acute distress.   Awake, alert, oriented to person, place, and time.  Pupils equal round and reactive to light.  Facial tone is symmetric.    Strength: Side Biceps Triceps Deltoid Interossei Grip Wrist Ext. Wrist Flex.  R 5 5 4+ 5 5 5 5   L 5 4+ 4+ 5 5 5 5    Incision c/d/I  MSK: TTP over left anterior shoulder.   Imaging:  09/25/24 cervical xrays Without evidence of hardware malfunction  Assessment / Plan: REYAH STREETER is doing better after C3-4 and C6-7 ACDF on 08/17/2024.   She continues to have neck and left shoulder pain.  She also continues to have myelopathic symptoms and issues with balance and her gait, as expected.  She unfortunately has not started physical therapy yet.  She has been seeing a pain management group for her medications.  Discussed the patient at length the importance of her going to physical therapy and decreasing fall risk.  Counseled patient on length of time of recovery for patients with myelopathy.  Advised her to continue following up with her pain management group as well as orthopedic surgery.  She was advised to contact us  for any changes, questions or concerns in the future.   Lyle Decamp PA-C Dept of Neurosurgery

## 2024-11-13 ENCOUNTER — Ambulatory Visit: Admitting: Internal Medicine

## 2024-11-14 ENCOUNTER — Other Ambulatory Visit: Payer: Self-pay | Admitting: Internal Medicine

## 2024-11-14 DIAGNOSIS — E782 Mixed hyperlipidemia: Secondary | ICD-10-CM

## 2024-11-15 ENCOUNTER — Ambulatory Visit

## 2024-11-16 ENCOUNTER — Emergency Department

## 2024-11-16 ENCOUNTER — Other Ambulatory Visit: Payer: Self-pay

## 2024-11-16 ENCOUNTER — Observation Stay
Admission: EM | Admit: 2024-11-16 | Discharge: 2024-11-17 | Disposition: A | Attending: Obstetrics and Gynecology | Admitting: Obstetrics and Gynecology

## 2024-11-16 DIAGNOSIS — N1832 Chronic kidney disease, stage 3b: Secondary | ICD-10-CM | POA: Diagnosis present

## 2024-11-16 DIAGNOSIS — I10 Essential (primary) hypertension: Secondary | ICD-10-CM | POA: Diagnosis present

## 2024-11-16 DIAGNOSIS — J441 Chronic obstructive pulmonary disease with (acute) exacerbation: Principal | ICD-10-CM | POA: Diagnosis present

## 2024-11-16 DIAGNOSIS — J9611 Chronic respiratory failure with hypoxia: Secondary | ICD-10-CM | POA: Diagnosis present

## 2024-11-16 DIAGNOSIS — K219 Gastro-esophageal reflux disease without esophagitis: Secondary | ICD-10-CM | POA: Diagnosis present

## 2024-11-16 DIAGNOSIS — J9621 Acute and chronic respiratory failure with hypoxia: Secondary | ICD-10-CM | POA: Diagnosis present

## 2024-11-16 DIAGNOSIS — G2581 Restless legs syndrome: Secondary | ICD-10-CM | POA: Diagnosis present

## 2024-11-16 DIAGNOSIS — Z79899 Other long term (current) drug therapy: Secondary | ICD-10-CM | POA: Insufficient documentation

## 2024-11-16 DIAGNOSIS — F32A Depression, unspecified: Secondary | ICD-10-CM | POA: Insufficient documentation

## 2024-11-16 DIAGNOSIS — I130 Hypertensive heart and chronic kidney disease with heart failure and stage 1 through stage 4 chronic kidney disease, or unspecified chronic kidney disease: Secondary | ICD-10-CM | POA: Insufficient documentation

## 2024-11-16 DIAGNOSIS — E039 Hypothyroidism, unspecified: Secondary | ICD-10-CM | POA: Diagnosis present

## 2024-11-16 DIAGNOSIS — I5032 Chronic diastolic (congestive) heart failure: Secondary | ICD-10-CM | POA: Diagnosis present

## 2024-11-16 DIAGNOSIS — G894 Chronic pain syndrome: Secondary | ICD-10-CM | POA: Diagnosis present

## 2024-11-16 DIAGNOSIS — F419 Anxiety disorder, unspecified: Secondary | ICD-10-CM | POA: Diagnosis present

## 2024-11-16 DIAGNOSIS — J101 Influenza due to other identified influenza virus with other respiratory manifestations: Secondary | ICD-10-CM | POA: Insufficient documentation

## 2024-11-16 LAB — BLOOD GAS, VENOUS
Acid-Base Excess: 2.6 mmol/L — ABNORMAL HIGH (ref 0.0–2.0)
Bicarbonate: 30.4 mmol/L — ABNORMAL HIGH (ref 20.0–28.0)
O2 Saturation: 80.9 %
Patient temperature: 37
pCO2, Ven: 55 mmHg (ref 44–60)
pH, Ven: 7.35 (ref 7.25–7.43)
pO2, Ven: 47 mmHg — ABNORMAL HIGH (ref 32–45)

## 2024-11-16 LAB — CBC
HCT: 39.3 % (ref 36.0–46.0)
Hemoglobin: 12.9 g/dL (ref 12.0–15.0)
MCH: 30.4 pg (ref 26.0–34.0)
MCHC: 32.8 g/dL (ref 30.0–36.0)
MCV: 92.7 fL (ref 80.0–100.0)
Platelets: 204 K/uL (ref 150–400)
RBC: 4.24 MIL/uL (ref 3.87–5.11)
RDW: 15.1 % (ref 11.5–15.5)
WBC: 6.8 K/uL (ref 4.0–10.5)
nRBC: 0 % (ref 0.0–0.2)

## 2024-11-16 LAB — BASIC METABOLIC PANEL WITH GFR
Anion gap: 9 (ref 5–15)
BUN: 9 mg/dL (ref 8–23)
CO2: 27 mmol/L (ref 22–32)
Calcium: 9.3 mg/dL (ref 8.9–10.3)
Chloride: 95 mmol/L — ABNORMAL LOW (ref 98–111)
Creatinine, Ser: 0.79 mg/dL (ref 0.44–1.00)
GFR, Estimated: >60 mL/min (ref >60)
Glucose, Bld: 140 mg/dL — ABNORMAL HIGH (ref 70–99)
Potassium: 3.8 mmol/L (ref 3.5–5.1)
Sodium: 131 mmol/L — ABNORMAL LOW (ref 135–145)

## 2024-11-16 LAB — PRO BRAIN NATRIURETIC PEPTIDE: Pro Brain Natriuretic Peptide: 1132 pg/mL — ABNORMAL HIGH (ref <300.0)

## 2024-11-16 LAB — RESP PANEL BY RT-PCR (RSV, FLU A&B, COVID)  RVPGX2
Influenza A by PCR: POSITIVE — AB
Influenza B by PCR: NEGATIVE
Resp Syncytial Virus by PCR: NEGATIVE
SARS Coronavirus 2 by RT PCR: NEGATIVE

## 2024-11-16 LAB — MRSA NEXT GEN BY PCR, NASAL: MRSA by PCR Next Gen: NOT DETECTED

## 2024-11-16 LAB — TROPONIN T, HIGH SENSITIVITY: Troponin T High Sensitivity: 28 ng/L — ABNORMAL HIGH (ref 0–19)

## 2024-11-16 MED ORDER — ESCITALOPRAM OXALATE 10 MG PO TABS
20.0000 mg | ORAL_TABLET | ORAL | Status: DC
  Administered 2024-11-17: 20 mg via ORAL
  Filled 2024-11-16: qty 2

## 2024-11-16 MED ORDER — LEVOFLOXACIN 500 MG PO TABS
500.0000 mg | ORAL_TABLET | Freq: Every day | ORAL | Status: DC
  Administered 2024-11-16 – 2024-11-17 (×2): 500 mg via ORAL
  Filled 2024-11-16 (×2): qty 1

## 2024-11-16 MED ORDER — ONDANSETRON HCL 4 MG/2ML IJ SOLN: 4.0000 mg | Freq: Four times a day (QID) | INTRAMUSCULAR | Status: DC | PRN

## 2024-11-16 MED ORDER — PREDNISONE 20 MG PO TABS
40.0000 mg | ORAL_TABLET | Freq: Every day | ORAL | Status: DC
  Administered 2024-11-17: 40 mg via ORAL
  Filled 2024-11-16: qty 2

## 2024-11-16 MED ORDER — POLYETHYLENE GLYCOL 3350 17 G PO PACK: 17.0000 g | PACK | Freq: Every day | ORAL | Status: DC | PRN

## 2024-11-16 MED ORDER — MINOXIDIL 10 MG PO TABS
10.0000 mg | ORAL_TABLET | Freq: Every day | ORAL | Status: DC
  Administered 2024-11-16 – 2024-11-17 (×2): 10 mg via ORAL
  Filled 2024-11-16 (×3): qty 1

## 2024-11-16 MED ORDER — ONDANSETRON HCL 4 MG PO TABS: 4.0000 mg | ORAL_TABLET | Freq: Four times a day (QID) | ORAL | Status: DC | PRN

## 2024-11-16 MED ORDER — METHYLPREDNISOLONE SODIUM SUCC 40 MG IJ SOLR
40.0000 mg | Freq: Two times a day (BID) | INTRAMUSCULAR | Status: AC
  Administered 2024-11-16 – 2024-11-17 (×2): 40 mg via INTRAVENOUS
  Filled 2024-11-16 (×2): qty 1

## 2024-11-16 MED ORDER — OSELTAMIVIR PHOSPHATE 75 MG PO CAPS
75.0000 mg | ORAL_CAPSULE | Freq: Two times a day (BID) | ORAL | Status: DC
  Administered 2024-11-16 – 2024-11-17 (×2): 75 mg via ORAL
  Filled 2024-11-16 (×3): qty 1

## 2024-11-16 MED ORDER — PANTOPRAZOLE SODIUM 40 MG PO TBEC
40.0000 mg | DELAYED_RELEASE_TABLET | Freq: Two times a day (BID) | ORAL | Status: DC
  Administered 2024-11-16 – 2024-11-17 (×2): 40 mg via ORAL
  Filled 2024-11-16 (×2): qty 1

## 2024-11-16 MED ORDER — ACETAMINOPHEN 325 MG PO TABS
650.0000 mg | ORAL_TABLET | Freq: Four times a day (QID) | ORAL | Status: DC | PRN
  Administered 2024-11-16 (×2): 650 mg via ORAL
  Filled 2024-11-16 (×2): qty 2

## 2024-11-16 MED ORDER — ORAL CARE MOUTH RINSE: 15.0000 mL | OROMUCOSAL | Status: DC | PRN

## 2024-11-16 MED ORDER — LEVOFLOXACIN 500 MG PO TABS: 750.0000 mg | ORAL_TABLET | Freq: Every day | ORAL | Status: DC

## 2024-11-16 MED ORDER — PRAMIPEXOLE DIHYDROCHLORIDE 0.25 MG PO TABS
0.2500 mg | ORAL_TABLET | Freq: Three times a day (TID) | ORAL | Status: DC
  Administered 2024-11-16 – 2024-11-17 (×3): 0.25 mg via ORAL
  Filled 2024-11-16 (×4): qty 1

## 2024-11-16 MED ORDER — NICOTINE 14 MG/24HR TD PT24
14.0000 mg | MEDICATED_PATCH | Freq: Every day | TRANSDERMAL | Status: DC
  Administered 2024-11-16 – 2024-11-17 (×2): 14 mg via TRANSDERMAL
  Filled 2024-11-16 (×2): qty 1

## 2024-11-16 MED ORDER — BUSPIRONE HCL 10 MG PO TABS
30.0000 mg | ORAL_TABLET | Freq: Two times a day (BID) | ORAL | Status: DC
  Administered 2024-11-16 – 2024-11-17 (×3): 30 mg via ORAL
  Filled 2024-11-16 (×3): qty 3

## 2024-11-16 MED ORDER — IPRATROPIUM-ALBUTEROL 0.5-2.5 (3) MG/3ML IN SOLN
6.0000 mL | Freq: Once | RESPIRATORY_TRACT | Status: AC
  Administered 2024-11-16: 6 mL via RESPIRATORY_TRACT
  Filled 2024-11-16: qty 6

## 2024-11-16 MED ORDER — LEVOTHYROXINE SODIUM 50 MCG PO TABS
25.0000 ug | ORAL_TABLET | Freq: Every day | ORAL | Status: DC
  Administered 2024-11-16 – 2024-11-17 (×2): 25 ug via ORAL
  Filled 2024-11-16 (×2): qty 1

## 2024-11-16 MED ORDER — ENOXAPARIN SODIUM 40 MG/0.4ML IJ SOSY
40.0000 mg | PREFILLED_SYRINGE | INTRAMUSCULAR | Status: DC
  Administered 2024-11-16: 40 mg via SUBCUTANEOUS
  Filled 2024-11-16: qty 0.4

## 2024-11-16 MED ORDER — IRBESARTAN 75 MG PO TABS
37.5000 mg | ORAL_TABLET | Freq: Every day | ORAL | Status: DC
  Administered 2024-11-16 – 2024-11-17 (×2): 37.5 mg via ORAL
  Filled 2024-11-16 (×3): qty 0.5

## 2024-11-16 MED ORDER — IPRATROPIUM-ALBUTEROL 0.5-2.5 (3) MG/3ML IN SOLN: 3.0000 mL | RESPIRATORY_TRACT | Status: DC | PRN

## 2024-11-16 MED ORDER — ACETAMINOPHEN 650 MG RE SUPP: 650.0000 mg | Freq: Four times a day (QID) | RECTAL | Status: DC | PRN

## 2024-11-16 MED ORDER — OXYCODONE HCL 5 MG PO TABS
5.0000 mg | ORAL_TABLET | ORAL | Status: DC | PRN
  Administered 2024-11-16 – 2024-11-17 (×4): 5 mg via ORAL
  Filled 2024-11-16 (×4): qty 1

## 2024-11-16 MED ORDER — MORPHINE SULFATE (PF) 2 MG/ML IV SOLN
2.0000 mg | INTRAVENOUS | Status: DC | PRN
  Administered 2024-11-16 – 2024-11-17 (×2): 2 mg via INTRAVENOUS
  Filled 2024-11-16 (×2): qty 1

## 2024-11-16 MED ORDER — ACETAMINOPHEN 325 MG PO TABS
650.0000 mg | ORAL_TABLET | Freq: Once | ORAL | Status: AC
  Administered 2024-11-16: 650 mg via ORAL
  Filled 2024-11-16: qty 2

## 2024-11-16 MED ORDER — DILTIAZEM HCL ER COATED BEADS 180 MG PO CP24
180.0000 mg | ORAL_CAPSULE | ORAL | Status: DC
  Administered 2024-11-17: 180 mg via ORAL
  Filled 2024-11-16: qty 1

## 2024-11-16 MED ORDER — OXYCODONE HCL 5 MG PO TABS
5.0000 mg | ORAL_TABLET | Freq: Once | ORAL | Status: AC
  Administered 2024-11-16: 5 mg via ORAL
  Filled 2024-11-16: qty 1

## 2024-11-16 NOTE — ED Provider Notes (Signed)
 [facility] Provider Note    Event Date/Time   First MD Initiated Contact with Patient 11/16/24 [phone]     (approximate)   History   Shortness of Breath   HPI  [name] is a 68 year old female with history of COPD, CHF presenting to the emergency department for evaluation of shortness of breath.  Patient reports that this morning she had worsening shortness of breath.  With EMS was found to have a room air sat of 80%.  Has home oxygen  but reports that she typically only needs this at night.  Received 125 Solu-Medrol , 2 DuoNebs, 1 albuterol .  Reports feeling improved but still wheezy.  Does not feel fluid overloaded.      Physical Exam   Triage Vital Signs: ED Triage Vitals  Encounter Vitals Group     BP 11/16/24 0904 (!) 160/92     Girls Systolic BP Percentile --      Girls Diastolic BP Percentile --      Boys Systolic BP Percentile --      Boys Diastolic BP Percentile --      Pulse Rate 11/16/24 0904 78     Resp 11/16/24 0904 (!) 24     Temp 11/16/24 0904 99.6 F (37.6 C)     Temp Source 11/16/24 0904 Oral     SpO2 11/16/24 0909 93 %     Weight 11/16/24 0903 167 lb 8.8 oz (76 kg)     Height 11/16/24 0903 5' 6 (1.676 m)     Head Circumference --      Peak Flow --      Pain Score 11/16/24 0902 7     Pain Loc --      Pain Education --      Exclude from Growth Chart --     Most recent vital signs: Vitals:   11/16/24 1130 11/16/24 1145  BP: (!) 176/82   Pulse: 88 95  Resp: 20 (!) 21  Temp:    SpO2: (!) 87% 93%     General: Awake, interactive  CV:  Good peripheral perfusion Resp:  Tachypneic with mildly labored respirations, scattered wheezing noted, satting in the low 90s on 3 L Abd:  Nondistended.  Neuro:  Symmetric facial movement, fluid speech   ED Results / Procedures / Treatments   Labs (all labs ordered are listed, but only abnormal results are displayed) Labs Reviewed  BASIC METABOLIC PANEL WITH GFR - Abnormal;  Notable for the following components:      Result Value   Sodium 131 (*)    Chloride 95 (*)    Glucose, Bld 140 (*)    All other components within normal limits  PRO BRAIN NATRIURETIC PEPTIDE - Abnormal; Notable for the following components:   Pro Brain Natriuretic Peptide 1,132.0 (*)    All other components within normal limits  BLOOD GAS, VENOUS - Abnormal; Notable for the following components:   pO2, Ven 47 (*)    Bicarbonate 30.4 (*)    Acid-Base Excess 2.6 (*)    All other components within normal limits  TROPONIN T, HIGH SENSITIVITY - Abnormal; Notable for the following components:   Troponin T High Sensitivity 28 (*)    All other components within normal limits  RESP PANEL BY RT-PCR (RSV, FLU A&B, COVID)  RVPGX2  MRSA NEXT GEN BY PCR, NASAL  CBC     EKG EKG independently reviewed and interpreted by myself demonstrates:  EKG demonstrate sinus rhythm at rate  79, PR 163, QRS 92, QTc 446, no acute ST changes  RADIOLOGY Imaging independently reviewed and interpreted by myself demonstrates:  CXR without focal consolidation  Formal Radiology Read:  [facility] Chest Port 1 View Result Date: 11/16/2024 CLINICAL DATA:  [id number] SOB (shortness of breath) [id number] EXAM: PORTABLE CHEST - 1 VIEW COMPARISON:  08/03/2024 FINDINGS: No focal airspace consolidation, pleural effusion, or pneumothorax. Mild cardiomegaly. Tortuous aorta with aortic atherosclerosis. No acute fracture or destructive lesions. Multilevel thoracic osteophytosis. IMPRESSION: No acute cardiopulmonary abnormality. Electronically Signed   By: Rogelia Myers M.D.   On: 11/16/2024 09:57    PROCEDURES:  Critical Care performed: No  Procedures   MEDICATIONS ORDERED IN ED: Medications  methylPREDNISolone  sodium succinate (SOLU-MEDROL ) 40 mg/mL injection 40 mg (40 mg Intravenous Given 11/16/24 1244)    Followed by  predniSONE  (DELTASONE ) tablet 40 mg (has no administration in time range)  enoxaparin  (LOVENOX ) injection 40 mg  (has no administration in time range)  acetaminophen  (TYLENOL ) tablet 650 mg (has no administration in time range)    Or  acetaminophen  (TYLENOL ) suppository 650 mg (has no administration in time range)  oxyCODONE  (Oxy IR/ROXICODONE ) immediate release tablet 5 mg (has no administration in time range)  morphine  (PF) 2 MG/ML injection 2 mg (has no administration in time range)  polyethylene glycol (MIRALAX  / GLYCOLAX ) packet 17 g (has no administration in time range)  ondansetron  (ZOFRAN ) tablet 4 mg (has no administration in time range)    Or  ondansetron  (ZOFRAN ) injection 4 mg (has no administration in time range)  ipratropium-albuterol  (DUONEB) 0.5-2.5 (3) MG/3ML nebulizer solution 3 mL (has no administration in time range)  busPIRone  (BUSPAR ) tablet 30 mg (has no administration in time range)  diltiazem  (CARDIZEM  CD) 24 hr capsule 180 mg (has no administration in time range)  escitalopram  (LEXAPRO ) tablet 20 mg (has no administration in time range)  levothyroxine  (SYNTHROID ) tablet 25 mcg (has no administration in time range)  minoxidil  (LONITEN ) tablet 10 mg (has no administration in time range)  irbesartan  (AVAPRO ) tablet 37.5 mg (has no administration in time range)  pantoprazole  (PROTONIX ) EC tablet 40 mg (has no administration in time range)  pramipexole  (MIRAPEX ) tablet 0.25 mg (has no administration in time range)  levofloxacin  (LEVAQUIN ) tablet 500 mg (500 mg Oral Given 11/16/24 1244)  ipratropium-albuterol  (DUONEB) 0.5-2.5 (3) MG/3ML nebulizer solution 6 mL (6 mLs Nebulization Given 11/16/24 0922)  oxyCODONE  (Oxy IR/ROXICODONE ) immediate release tablet 5 mg (5 mg Oral Given 11/16/24 1010)  acetaminophen  (TYLENOL ) tablet 650 mg (650 mg Oral Given 11/16/24 1146)     IMPRESSION / MDM / ASSESSMENT AND PLAN / ED COURSE  I reviewed the triage vital signs and the nursing notes.  Differential diagnosis includes, but is not limited to, COPD exacerbation, pneumonia, CHF exacerbation, viral  illness  Patient's presentation is most consistent with acute presentation with potential threat to life or bodily function.  68 year old female presenting to the emergency department for evaluation of shortness of breath.  Wheezing with labored respirations on presentation.  Hypoxic with EMS.  Does have home oxygen , but it is atypical for her to require this during the day.  Given additional nebulizer treatments here.  Labs with reassuring CBC, BMP without significant derangement.  VBG overall reassuring.  Minimally elevated troponin.  BNP elevated, but clinically without evidence of volume overload and does not subjectively feel volume overloaded.  X-Shatavia Santor without acute findings.  No sputum change, will hold off on antibiotics.  On reassessment reports some improvement, but has persistent  oxygen  requirement and does not feel back at baseline.  With this, do think she is appropriate for admission.   Clinical Course as of 11/16/24 1302  Fri Nov 16, 2024  1104 Case discussed with Dr. Paudel. He will evaluate for anticipated admission.  [NR]    Clinical Course User Index [NR] Levander Slate, MD     FINAL CLINICAL IMPRESSION(S) / ED DIAGNOSES   Final diagnoses:  COPD exacerbation (HCC)     Rx / DC Orders   ED Discharge Orders     None        Note:  This document was prepared using Dragon voice recognition software and may include unintentional dictation errors.   Levander Slate, MD 11/16/24 1302

## 2024-11-16 NOTE — ED Notes (Signed)
Second trop sent

## 2024-11-16 NOTE — ED Notes (Signed)
 Sent med message to retime unverified meds.

## 2024-11-16 NOTE — ED Notes (Signed)
 Trying to get pulse ox reading.

## 2024-11-16 NOTE — ED Notes (Signed)
 Pt up to void. Walking with own cane.

## 2024-11-16 NOTE — H&P (Addendum)
 History and Physical    TREANNA DUMLER FMW:[id number] DOB: [birth year] DOA: 11/16/2024  DOS: the patient was seen and examined on 11/16/2024  PCP: HamrickCharlene CROME, MD   Patient coming from: Home  I have personally briefly reviewed patient's old medical records in [facility]  Chief Complaint: Shortness of breath since this morning  HPI: GINAMARIE BANFIELD is a pleasant 68 y.o. female with medical history significant for COPD on home oxygen  3 L as needed, chronic pain syndrome, chronic active smoker, s/p C3-C4 and C6-C7 ACDF 3 months ago, HFpEF, HTN, HLD, hypothyroidism, GERD, diabetes, depression who came into ED complaining of shortness of breath which woke her up this morning.  Patient stated that she has been having some shortness of breath for the last 1 or 2 days with cough and congestion.  But this morning when she woke up it was severe enough to cause her problem.  She also complained that she was wheezing.  EMS notes that patient had 80% saturation on room air, has home oxygen  but typically wears only at night.  Received 125 mg of Solu-Medrol  IV, 2 DuoNebs, 1 albuterol  and not feeling better still wheezing.  Patient denies any fever, chills, nausea, vomiting, hematemesis, melena.  She denies any leg swelling.  She denies any sick contact.  ED Course: Upon arrival to the ED, patient is found to be hypertensive, tachypneic at 24, saturating 90% on 3 L nasal cannula, proBNP was 1132, troponin was 28 EKG shows sinus rhythm at 79 bpm, chest x-ray showed no acute cardiopulmonary abnormalities.  Patient was given DuoNebs, oxygen , oxycodone  and hospitalist service was consulted for evaluation for admission.  Review of Systems:  ROS  All other systems negative except as noted in the HPI.  Past Medical History:  Diagnosis Date   (HFpEF) heart failure with preserved ejection fraction (HCC)    Anxiety    Aortic atherosclerosis    Arthritis    Bilateral hand numbness    CAD (coronary artery  disease)    Cervical myelopathy (HCC)    Cervical spinal stenosis    Chest pain at rest    CHF (congestive heart failure) (HCC)    Chronic pain syndrome    Chronic respiratory failure with hypoxia and hypercapnia (HCC)    CKD stage 3b, GFR 30-44 ml/min (HCC)    Community acquired pneumonia 01/10/2013   COPD (chronic obstructive pulmonary disease) (HCC)    DDD (degenerative disc disease), thoracic    Dependence on supplemental oxygen  (3L/[place] qhs PRN)    Depression    DM (diabetes mellitus), type 2 (HCC)    Dyspnea    Failed back surgical syndrome    GERD (gastroesophageal reflux disease)    Hyperlipidemia    Hypertension    Hypothyroid    Lumbar spondylosis    a.) s/p lumbar laminectomy, microdiscectomy, and L4-L5 fusion   Neuropathy    Nicotine  dependence    Normocytic anemia    OSA on CPAP    Pancreatitis 03/2024   Peripheral vascular disease    Pulmonary hypertension (HCC)    Recurrent UTI (urinary tract infection)    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  no seizure  since--pt does take keppra  and office note from neurologist dr. margaret on this chart   Sepsis due to pneumonia [facility])    Sinus tachycardia    Tobacco abuse    Urinary incontinence    Wound of left leg    being seen at wound clinic as of 03-23-24    Past Surgical History:  Procedure Laterality Date   ANTERIOR CERVICAL DECOMP/DISCECTOMY FUSION N/A 08/17/2024   Procedure: ANTERIOR CERVICAL DECOMPRESSION/DISCECTOMY FUSION 2 LEVELS;  Surgeon: Clois Fret, MD;  Location: [facility] ORS;  Service: Neurosurgery;  Laterality: N/A;  C3-4 & C6-7 ANTERIOR CERVICAL DISCECTOMY AND FUSION   CARPAL TUNNEL RELEASE Left    COLONOSCOPY WITH PROPOFOL  N/A  06/17/2022   Procedure: COLONOSCOPY WITH PROPOFOL ;  Surgeon: Maryruth Ole DASEN, MD;  Location: [facility] ENDOSCOPY;  Service: Endoscopy;  Laterality: N/A;   JOINT REPLACEMENT  08/26/2011   right total knee arthroplasty, left total knee   KNEE ARTHROPLASTY  10/28/2011   Procedure: COMPUTER ASSISTED TOTAL KNEE ARTHROPLASTY;  Surgeon: Norleen CROME Rendall III;  Location: [facility];  Service: Orthopedics;  Laterality: Left;  preop femoral nerve block   LAMINECTOMY AND MICRODISCECTOMY LUMBAR SPINE N/A    Procedure: LUMBAR LAMINECTOMY, MICRODISCECTOMY, AND L4-L5 FUSION   OPEN REDUCTION INTERNAL FIXATION (ORIF) METACARPAL Left 01/30/2024   Procedure: OPEN REDUCTION INTERNAL FIXATION (ORIF) OF LEFT 5TH METACARPAL SHAFT FRACTURE.;  Surgeon: Edie Norleen PARAS, MD;  Location: [facility] ORS;  Service: Orthopedics;  Laterality: Left;   PROCTOSCOPY N/A 06/18/2022   Procedure: RIGID PROCTOSCOPY;  Surgeon: Sheldon Standing, MD;  Location: [facility] ORS;  Service: General;  Laterality: N/A;     reports that she has been smoking cigarettes. She has a 100 pack-year smoking history. She has been exposed to tobacco smoke. She has never used smokeless tobacco. She reports current alcohol  use. She reports that she does not use drugs.  Allergies  Allergen Reactions   Nsaids Other (See Comments)    Pt states it messes up her kidneys But takes aspirin  & ibuprofen    Thiazide-Type Diuretics Other (See Comments)    Other reaction(s): Due to CKD    Family History  Problem Relation Age of Onset   Heart attack Father 9   Alzheimer's disease Mother    Breast cancer Neg Hx     Prior to Admission medications   Medication Sig Start Date End Date Taking? Authorizing Provider  acetaminophen  (TYLENOL ) 650 MG CR tablet Take 2 tablets (1,300 mg total) by mouth every 8 (eight) hours as needed for pain. Home med. 07/28/24   Awanda City, MD  albuterol  (VENTOLIN  HFA) 108 (90 Base) MCG/ACT inhaler Inhale 2 puffs into the lungs every 4 (four) hours as needed  for wheezing or shortness of breath. 11/29/22   Bernardo Fend, DO  budesonide -glycopyrrolate -formoterol  (BREZTRI ) 160-9-4.8 MCG/ACT AERO inhaler Inhale 2 puffs into the lungs in the morning and at bedtime. 07/04/24 07/04/25  [provider]  busPIRone  (BUSPAR ) 30 MG tablet Take 1 tablet (30 mg total) by mouth 2 (two) times daily. 08/07/24   Albina GORMAN Dine, MD  Cholecalciferol  (VITAMIN D -3) 25 MCG (1000 UT) CAPS Take 1,000 Units by mouth daily.    [provider]  cloNIDine  (CATAPRES ) 0.3 MG tablet Take 1 tablet (0.3 mg total) by mouth 2 (two) times daily. 10/05/24 11/12/24  Albina GORMAN Dine, MD  cyanocobalamin  (VITAMIN B12) 1000 MCG tablet TAKE 1 TABLET BY MOUTH EVERY DAY *NEW PRESCRIPTION REQUEST* 06/22/24   Albina GORMAN Dine, MD  diltiazem  (CARDIZEM  CD) 180 MG 24  hr capsule Take 180 mg by mouth every morning.    [provider]  escitalopram  (LEXAPRO ) 20 MG tablet TAKE 1 TABLET (20 MG TOTAL) BY MOUTH EVERY MORNING. 11/12/24   Albina GORMAN Dine, MD  fenofibrate  micronized (LOFIBRA) 134 MG capsule TAKE 1 CAPSULE BY MOUTH EVERY DAY BEFORE BREAKFAST 11/16/24   Albina GORMAN Dine, MD  fluticasone  (FLONASE ) 50 MCG/ACT nasal spray Place 1 spray into both nostrils daily as needed for allergies. 05/17/24 11/12/24  Poggi, Norleen PARAS, MD  gabapentin  (NEURONTIN ) 800 MG tablet TAKE 1 TABLET BY MOUTH THREE TIMES DAILY 10/03/24   Albina GORMAN Dine, MD  levothyroxine  (SYNTHROID ) 25 MCG tablet TAKE 1 TABLET BY MOUTH ONCE DAILY 11/12/24   Tejan-Sie, S Ahmed, MD  lidocaine -prilocaine (EMLA) cream Apply 1 Application topically 4 (four) times daily as needed (pain). 07/20/24   [provider]  methocarbamol  (ROBAXIN ) 500 MG tablet Take 1 tablet (500 mg total) by mouth every 6 (six) hours as needed for muscle spasms. 08/29/24   Ulis Bottcher, PA-C  minoxidil  (LONITEN ) 10 MG tablet TAKE 1 TABLET BY MOUTH EVERY DAY 10/08/24   Albina GORMAN Dine, MD  naphazoline-glycerin  (CLEAR EYES  REDNESS) 0.012-0.25 % SOLN Place 1-2 drops into both eyes 4 (four) times daily as needed for eye irritation.    [provider]  olmesartan  (BENICAR ) 40 MG tablet TAKE 1 TABLET BY MOUTH EVERY DAY 11/05/24   Albina GORMAN Dine, MD  ondansetron  (ZOFRAN -ODT) 4 MG disintegrating tablet Take 1 tablet (4 mg total) by mouth every 6 (six) hours. 10/02/24   Albina GORMAN Dine, MD  oxyCODONE  (OXY IR/ROXICODONE ) 5 MG immediate release tablet Take 1 tablet (5 mg total) by mouth every 6 (six) hours as needed for severe pain (pain score 7-10). 10/22/24   Ulis Bottcher, PA-C  OXYGEN  Inhale 3 L into the lungs See admin instructions. Inhale 3L of oxygen  throughout the day as needed and at night    [provider]  pantoprazole  (PROTONIX ) 40 MG tablet TAKE 1 TABLET BY MOUTH EVERY DAY IN THE MORNING 04/02/24   Albina GORMAN Dine, MD  pramipexole  (MIRAPEX ) 0.25 MG tablet TAKE 1 TABLET BY MOUTH 3 TIMES DAILY 10/31/24   Albina GORMAN Dine, MD  pyridOXINE (VITAMIN B6) 100 MG tablet TAKE 1 TABLET BY MOUTH EVERY DAY *NEW PRESCRIPTION REQUEST* 06/22/24   Albina GORMAN Dine, MD    Physical Exam: Vitals:   11/16/24 0909 11/16/24 1030 11/16/24 1130 11/16/24 1145  BP:  (!) 171/78 (!) 176/82   Pulse:  96 88 95  Resp:  (!) 26 20 (!) 21  Temp:      TempSrc:      SpO2: 93% 92% (!) 87% 93%  Weight:      Height:        Physical Exam   Constitutional: Alert, awake, calm, comfortable HEENT: Neck supple Respiratory: Bilateral decreased air entry at the bases with bilateral basal or extensive wheezing.  No rales, occasional scattered rhonchi's. Cardiovascular: Regular rate and rhythm, no murmurs / rubs / gallops. No extremity edema. 2+ pedal pulses. No carotid bruits.  Abdomen: Soft, no tenderness, Bowel sounds positive.  Musculoskeletal: no clubbing / cyanosis. Good ROM, no contractures. Normal muscle tone.  Skin: no rashes, lesions, ulcers. Neurologic: CN 2-12 grossly intact. Sensation intact, No focal  deficit identified Psychiatric: Alert and oriented x 3. Normal mood.    Labs on Admission: I have personally reviewed following labs and imaging studies  CBC: Recent Labs  Lab 11/16/24 0904  WBC 6.8  HGB 12.9  HCT 39.3  MCV 92.7  PLT 204   Basic Metabolic Panel: Recent Labs  Lab 11/16/24 0904  NA 131*  K 3.8  CL 95*  CO2 27  GLUCOSE 140*  BUN 9  CREATININE 0.79  CALCIUM  9.3   GFR: Estimated Creatinine Clearance: 71.1 mL/min (by C-G formula based on SCr of 0.79 mg/dL). Liver Function Tests: No results for input(s): AST, ALT, ALKPHOS, BILITOT, PROT, ALBUMIN in the last 168 hours. No results for input(s): LIPASE, AMYLASE in the last 168 hours. No results for input(s): AMMONIA in the last 168 hours. Coagulation Profile: No results for input(s): INR, PROTIME in the last 168 hours. Cardiac Enzymes: No results for input(s): CKTOTAL, CKMB, CKMBINDEX, TROPONINI, TROPONINIHS in the last 168 hours. BNP (last 3 results) No results for input(s): BNP in the last 8760 hours. HbA1C: No results for input(s): HGBA1C in the last 72 hours. CBG: No results for input(s): GLUCAP in the last 168 hours. Lipid Profile: No results for input(s): CHOL, HDL, LDLCALC, TRIG, CHOLHDL, LDLDIRECT in the last 72 hours. Thyroid  Function Tests: No results for input(s): TSH, T4TOTAL, FREET4, T3FREE, THYROIDAB in the last 72 hours. Anemia Panel: No results for input(s): VITAMINB12, FOLATE, FERRITIN, TIBC, IRON, RETICCTPCT in the last 72 hours. Urine analysis:    Component Value Date/Time   COLORURINE STRAW (A) 08/03/2024 1533   APPEARANCEUR CLEAR (A) 08/03/2024 1533   APPEARANCEUR Cloudy (A) 10/17/2023 1440   LABSPEC 1.005 08/03/2024 1533   PHURINE 8.0 08/03/2024 1533   GLUCOSEU NEGATIVE 08/03/2024 1533   HGBUR NEGATIVE 08/03/2024 1533   BILIRUBINUR negative 08/15/2024 1336   BILIRUBINUR Negative 10/17/2023 1440   KETONESUR  NEGATIVE 08/03/2024 1533   PROTEINUR Negative 08/15/2024 1336   PROTEINUR NEGATIVE 08/03/2024 1533   UROBILINOGEN 0.2 08/15/2024 1336   UROBILINOGEN 0.2 04/14/2014 1217   NITRITE negative 08/15/2024 1336   NITRITE NEGATIVE 08/03/2024 1533   LEUKOCYTESUR Negative 08/15/2024 1336   LEUKOCYTESUR LARGE (A) 08/03/2024 1533    Radiological Exams on Admission: I have personally reviewed images DG Chest Port 1 View Result Date: 11/16/2024 CLINICAL DATA:  [id number] SOB (shortness of breath) [id number] EXAM: PORTABLE CHEST - 1 VIEW COMPARISON:  08/03/2024 FINDINGS: No focal airspace consolidation, pleural effusion, or pneumothorax. Mild cardiomegaly. Tortuous aorta with aortic atherosclerosis. No acute fracture or destructive lesions. Multilevel thoracic osteophytosis. IMPRESSION: No acute cardiopulmonary abnormality. Electronically Signed   By: Rogelia Myers M.D.   On: 11/16/2024 09:57    EKG: My personal interpretation of EKG shows: Sinus rhythm at 79 bpm no ST elevations.    Assessment/Plan Principal Problem:   Acute on chronic respiratory failure with hypoxia (HCC) Active Problems:   Chronic diastolic CHF (congestive heart failure) (HCC)   Hypothyroidism   Anxiety and depression   GERD (gastroesophageal reflux disease)   Restless leg syndrome   Essential hypertension   COPD exacerbation (HCC)    Assessment and Plan: 68 year old female with history of COPD on home oxygen  during the night 3 L [place], CHF diastolic, hypothyroid, anxiety/depression, GERD, restless leg syndrome, HTN who came into ED complaining of cough congestion and shortness of breath that started this morning.  1.  Acute exacerbation of COPD - COVID and RSV viral panels are still pending - Chest x-ray showed no acute infiltrates - Patient received DuoNebs, IV steroid, oxygen  and is still short of breath. - She will be placed in observation in telemetry - She will be continued on IV Solu-Medrol , oxygen , nebulization. - She  was also started on antibiotic for possible bronchitis  2.  Chronic diastolic congestive heart failure not in exacerbation - I do not see if she was taking any diuretics. - She does not appear to be volume overloaded even though her proBNP is slightly high - Continue to monitor  3.  Hypertension, uncontrolled - Patient has multiple medications for hypertension including Cardizem  and minoxidil , clonidine , olmesartan  which all will be continued. - Continue to monitor blood pressure - She will be placed on hydralazine  as needed for systolic blood pressure more than 160  4.  Restless leg syndrome/neuropathy - Continue her home pramipexole  0.25 mg 3 times daily - Continue gabapentin  at home dose of 800 mg 3 times daily  5.  GERD - Continue  Protonix  40 mg twice daily  6.  Hypothyroidism - Continue levothyroxine  25 mcg daily  7.  Anxiety/depression - Continue Lexapro    Addendum: Patient was positive for flu A.  She will be started on Tamiflu .   DVT prophylaxis: Lovenox  Code Status: Full Code Family Communication: None  Disposition Plan: Home  Consults called: None  Admission status: Observation, Telemetry bed   Nena Rebel, MD [facility] 11/16/2024, 12:56 PM

## 2024-11-16 NOTE — ED Triage Notes (Signed)
 To ED [facility] from home SOB since this AM while sitting in bed Was 80% on RA, 3L PRN. Placed back on 3L by EMS 2 duonebs, 1 albuterol , 125mg  solu medrol  given en route 20g L wrist 3L baseline  HR 89, 171/87, CBG 115, 99.3 temp, RR 17 Had wheezing, resolved after nebs  Hx COPD, chronic pain Current smoker

## 2024-11-16 NOTE — ED Notes (Signed)
Provided pillows

## 2024-11-17 DIAGNOSIS — J441 Chronic obstructive pulmonary disease with (acute) exacerbation: Secondary | ICD-10-CM | POA: Diagnosis not present

## 2024-11-17 DIAGNOSIS — J101 Influenza due to other identified influenza virus with other respiratory manifestations: Secondary | ICD-10-CM | POA: Insufficient documentation

## 2024-11-17 LAB — COMPREHENSIVE METABOLIC PANEL WITH GFR
ALT: 23 U/L (ref 0–44)
AST: 36 U/L (ref 15–41)
Albumin: 4.2 g/dL (ref 3.5–5.0)
Alkaline Phosphatase: 19 U/L — ABNORMAL LOW (ref 38–126)
Anion gap: 9 (ref 5–15)
BUN: 15 mg/dL (ref 8–23)
CO2: 29 mmol/L (ref 22–32)
Calcium: 9.9 mg/dL (ref 8.9–10.3)
Chloride: 97 mmol/L — ABNORMAL LOW (ref 98–111)
Creatinine, Ser: 0.8 mg/dL (ref 0.44–1.00)
GFR, Estimated: >60 mL/min (ref >60)
Glucose, Bld: 132 mg/dL — ABNORMAL HIGH (ref 70–99)
Potassium: 4.7 mmol/L (ref 3.5–5.1)
Sodium: 134 mmol/L — ABNORMAL LOW (ref 135–145)
Total Bilirubin: 0.3 mg/dL (ref 0.0–1.2)
Total Protein: 7.4 g/dL (ref 6.5–8.1)

## 2024-11-17 LAB — CBC
HCT: 44.7 % (ref 36.0–46.0)
Hemoglobin: 15 g/dL (ref 12.0–15.0)
MCH: 30.7 pg (ref 26.0–34.0)
MCHC: 33.6 g/dL (ref 30.0–36.0)
MCV: 91.6 fL (ref 80.0–100.0)
Platelets: 277 K/uL (ref 150–400)
RBC: 4.88 MIL/uL (ref 3.87–5.11)
RDW: 14.6 % (ref 11.5–15.5)
WBC: 3.7 K/uL — ABNORMAL LOW (ref 4.0–10.5)
nRBC: 0 % (ref 0.0–0.2)

## 2024-11-17 LAB — PROTIME-INR
INR: 1 (ref 0.8–1.2)
Prothrombin Time: 13.6 s (ref 11.4–15.2)

## 2024-11-17 MED ORDER — ALPRAZOLAM 0.25 MG PO TABS
0.2500 mg | ORAL_TABLET | Freq: Two times a day (BID) | ORAL | Status: DC | PRN
  Administered 2024-11-17 (×2): 0.25 mg via ORAL
  Filled 2024-11-17 (×2): qty 1

## 2024-11-17 MED ORDER — GUAIFENESIN-DM 100-10 MG/5ML PO SYRP
10.0000 mL | ORAL_SOLUTION | ORAL | Status: DC | PRN
  Administered 2024-11-17 (×2): 10 mL via ORAL
  Filled 2024-11-17 (×3): qty 10

## 2024-11-17 MED ORDER — SALINE SPRAY 0.65 % NA SOLN
1.0000 | NASAL | Status: DC | PRN
  Administered 2024-11-17: 1 via NASAL
  Filled 2024-11-17 (×2): qty 44

## 2024-11-17 MED ORDER — OSELTAMIVIR PHOSPHATE 75 MG PO CAPS: 75.0000 mg | ORAL_CAPSULE | Freq: Two times a day (BID) | ORAL | 0 refills | Status: AC

## 2024-11-17 MED ORDER — PREDNISONE 20 MG PO TABS: 40.0000 mg | ORAL_TABLET | Freq: Every day | ORAL | 0 refills | Status: AC

## 2024-11-17 NOTE — Plan of Care (Signed)
  Problem: Clinical Measurements: Goal: Ability to maintain clinical measurements within normal limits will improve Outcome: Progressing   Problem: Pain Managment: Goal: General experience of comfort will improve and/or be controlled Outcome: Progressing   Problem: Safety: Goal: Ability to remain free from injury will improve Outcome: Progressing

## 2024-11-17 NOTE — Discharge Summary (Signed)
 [name] FMW:[id number] DOB: 11/05/1956 DOA: 11/16/2024  PCP: Stephanie Charlene CROME, MD  Admit date: 11/16/2024 Discharge date: 11/17/2024  Time spent: 35 minutes  Recommendations for Outpatient Follow-up:  Pcp f/u 1 week, attention to respiratory status and blood pressure at that time    Discharge Diagnoses:  Principal Problem:   COPD with acute exacerbation (HCC) Active Problems:   Acute on chronic respiratory failure with hypoxia (HCC)   Chronic diastolic CHF (congestive heart failure) (HCC)   Chronic respiratory failure with hypoxia (HCC)   Stage 3b chronic kidney disease (HCC)   Chronic pain syndrome   Hypothyroidism   Anxiety and depression   GERD (gastroesophageal reflux disease)   Restless leg syndrome   Essential hypertension   COPD exacerbation (HCC)   Influenza A   Discharge Condition: improving  Diet recommendation: heart healthy  Filed Weights   11/16/24 0903  Weight: 76 kg    History of present illness:  From admission h and p [name] is a pleasant 68 y.o. female with medical history significant for COPD on home oxygen  3 L as needed, chronic pain syndrome, chronic active smoker, s/p C3-C4 and C6-C7 ACDF 3 months ago, HFpEF, HTN, HLD, hypothyroidism, GERD, diabetes, depression who came into ED complaining of shortness of breath which woke her up this morning.  Patient stated that she has been having some shortness of breath for the last 1 or 2 days with cough and congestion.  But this morning when she woke up it was severe enough to cause her problem.  She also complained that she was wheezing.  EMS notes that patient had 80% saturation on room air, has home oxygen  but typically wears only at night.  Received 125 mg of Solu-Medrol  IV, 2 DuoNebs, 1 albuterol  and not feeling better still wheezing.  Patient denies any fever, chills, nausea, vomiting, hematemesis, melena.  She denies any leg swelling.  She denies any sick contact.   Hospital Course:  Patient  presents with two days cough and dyspnea. Found to have acute copd exacerbation provoked by influenza a. Cxr clear. Treated with steroids and tamiflu . Symptomatically improved, wheeze resolved, on home 3 liters oxygen , ambulating without difficulty, feels well enough to discharge. Will discharge home with course of tamiflu  and oral prednisone , advise pcp f/u next week. Other chronic conditions stable though bp elevated, advise attention to bp at f/u.  Procedures: none   Consultations: none  Discharge Exam: Vitals:   11/17/24 0419 11/17/24 0424  BP:  (!) 169/72  Pulse: 71 70  Resp: 18   Temp: 98.7 F (37.1 C)   SpO2: 100%     General: NAD Cardiovascular: RRR Respiratory: CTAB  Discharge Instructions   Discharge Instructions     Diet - low sodium heart healthy   Complete by: As directed    Increase activity slowly   Complete by: As directed       Allergies as of 11/17/2024       Reactions   Nsaids Other (See Comments)   Pt states it messes up her kidneys But takes aspirin  & ibuprofen    Thiazide-type Diuretics Other (See Comments)   Other reaction(s): Due to CKD        Medication List     TAKE these medications    acetaminophen  650 MG CR tablet Commonly known as: TYLENOL  Take 2 tablets (1,300 mg total) by mouth every 8 (eight) hours as needed for pain. Home med.   albuterol  108 (90 Base) MCG/ACT inhaler Commonly  known as: VENTOLIN  HFA Inhale 2 puffs into the lungs every 4 (four) hours as needed for wheezing or shortness of breath.   budesonide -glycopyrrolate -formoterol  160-9-4.8 MCG/ACT Aero inhaler Commonly known as: BREZTRI  Inhale 2 puffs into the lungs in the morning and at bedtime.   busPIRone  30 MG tablet Commonly known as: BUSPAR  Take 1 tablet (30 mg total) by mouth 2 (two) times daily.   cloNIDine  0.3 MG tablet Commonly known as: CATAPRES  Take 1 tablet (0.3 mg total) by mouth 2 (two) times daily.   cyanocobalamin  1000 MCG tablet Commonly  known as: VITAMIN B12 TAKE 1 TABLET BY MOUTH EVERY DAY *NEW PRESCRIPTION REQUEST*   diltiazem  180 MG 24 hr capsule Commonly known as: CARDIZEM  CD Take 180 mg by mouth every morning.   escitalopram  20 MG tablet Commonly known as: LEXAPRO  TAKE 1 TABLET (20 MG TOTAL) BY MOUTH EVERY MORNING.   fenofibrate  micronized 134 MG capsule Commonly known as: LOFIBRA TAKE 1 CAPSULE BY MOUTH EVERY DAY BEFORE BREAKFAST What changed: See the new instructions.   fluticasone  50 MCG/ACT nasal spray Commonly known as: FLONASE  Place 1 spray into both nostrils daily as needed for allergies.   gabapentin  800 MG tablet Commonly known as: NEURONTIN  TAKE 1 TABLET BY MOUTH THREE TIMES DAILY   levothyroxine  25 MCG tablet Commonly known as: SYNTHROID  TAKE 1 TABLET BY MOUTH ONCE DAILY   lidocaine -prilocaine cream Commonly known as: EMLA Apply 1 Application topically 4 (four) times daily as needed (pain).   methocarbamol  500 MG tablet Commonly known as: ROBAXIN  Take 1 tablet (500 mg total) by mouth every 6 (six) hours as needed for muscle spasms.   minoxidil  10 MG tablet Commonly known as: LONITEN  TAKE 1 TABLET BY MOUTH EVERY DAY   naphazoline-glycerin  Soln Commonly known as: CLEAR EYES REDNESS Place 1-2 drops into both eyes 4 (four) times daily as needed for eye irritation.   olmesartan  40 MG tablet Commonly known as: BENICAR  TAKE 1 TABLET BY MOUTH EVERY DAY   ondansetron  4 MG disintegrating tablet Commonly known as: ZOFRAN -ODT Take 1 tablet (4 mg total) by mouth every 6 (six) hours.   oseltamivir  75 MG capsule Commonly known as: Tamiflu  Take 1 capsule (75 mg total) by mouth 2 (two) times daily for 4 days.   oxyCODONE  5 MG immediate release tablet Commonly known as: Oxy IR/ROXICODONE  Take 1 tablet (5 mg total) by mouth every 6 (six) hours as needed for severe pain (pain score 7-10).   OXYGEN  Inhale 3 L into the lungs See admin instructions. Inhale 3L of oxygen  throughout the day as  needed and at night   pantoprazole  40 MG tablet Commonly known as: PROTONIX  TAKE 1 TABLET BY MOUTH EVERY DAY IN THE MORNING   pramipexole  0.25 MG tablet Commonly known as: MIRAPEX  TAKE 1 TABLET BY MOUTH 3 TIMES DAILY   predniSONE  20 MG tablet Commonly known as: DELTASONE  Take 2 tablets (40 mg total) by mouth daily with breakfast for 5 days. Start taking on: November 18, 2024   pyridOXINE 100 MG tablet Commonly known as: VITAMIN B6 TAKE 1 TABLET BY MOUTH EVERY DAY *NEW PRESCRIPTION REQUEST*   Vitamin D -3 25 MCG (1000 UT) Caps Take 1,000 Units by mouth daily.       Allergies  Allergen Reactions   Nsaids Other (See Comments)    Pt states it messes up her kidneys But takes aspirin  & ibuprofen    Thiazide-Type Diuretics Other (See Comments)    Other reaction(s): Due to CKD    Follow-up Information  Hamrick, Charlene CROME, MD Follow up.   Specialty: Family Medicine Why: 1 week Contact information: [address] [place] [address] [phone]                  The results of significant diagnostics from this hospitalization (including imaging, microbiology, ancillary and laboratory) are listed below for reference.    Significant Diagnostic Studies: DG Chest Port 1 View Result Date: 11/16/2024 CLINICAL DATA:  [id number] SOB (shortness of breath) [id number] EXAM: PORTABLE CHEST - 1 VIEW COMPARISON:  08/03/2024 FINDINGS: No focal airspace consolidation, pleural effusion, or pneumothorax. Mild cardiomegaly. Tortuous aorta with aortic atherosclerosis. No acute fracture or destructive lesions. Multilevel thoracic osteophytosis. IMPRESSION: No acute cardiopulmonary abnormality. Electronically Signed   By: Rogelia Myers M.D.   On: 11/16/2024 09:57    Microbiology: Recent Results (from the past 240 hours)  Resp panel by RT-PCR (RSV, Flu A&B, Covid) Anterior Nasal Swab     Status: Abnormal   Collection Time: 11/16/24 11:20 AM   Specimen: Anterior Nasal Swab  Result Value  Ref Range Status   SARS Coronavirus 2 by RT PCR NEGATIVE NEGATIVE Final    Comment: (NOTE) SARS-CoV-2 target nucleic acids are NOT DETECTED.  The SARS-CoV-2 RNA is generally detectable in upper respiratory specimens during the acute phase of infection. The lowest concentration of SARS-CoV-2 viral copies this assay can detect is 138 copies/mL. A negative result does not preclude SARS-Cov-2 infection and should not be used as the sole basis for treatment or other patient management decisions. A negative result may occur with  improper specimen collection/handling, submission of specimen other than nasopharyngeal swab, presence of viral mutation(s) within the areas targeted by this assay, and inadequate number of viral copies(<138 copies/mL). A negative result must be combined with clinical observations, patient history, and epidemiological information. The expected result is Negative.  Fact Sheet for Patients:  bloggercourse.com  Fact Sheet for Healthcare Providers:  seriousbroker.it  This test is no t yet approved or cleared by the [place]  FDA and  has been authorized for detection and/or diagnosis of SARS-CoV-2 by FDA under an Emergency Use Authorization (EUA). This EUA will remain  in effect (meaning this test can be used) for the duration of the COVID-19 declaration under Section 564(b)(1) of the Act, 21 U.S.C.section 360bbb-3(b)(1), unless the authorization is terminated  or revoked sooner.       Influenza A by PCR POSITIVE (A) NEGATIVE Final   Influenza B by PCR NEGATIVE NEGATIVE Final    Comment: (NOTE) The Xpert Xpress SARS-CoV-2/FLU/RSV plus assay is intended as an aid in the diagnosis of influenza from Nasopharyngeal swab specimens and should not be used as a sole basis for treatment. Nasal washings and aspirates are unacceptable for Xpert Xpress SARS-CoV-2/FLU/RSV testing.  Fact Sheet for  Patients: bloggercourse.com  Fact Sheet for Healthcare Providers: seriousbroker.it  This test is not yet approved or cleared by the [place]  FDA and has been authorized for detection and/or diagnosis of SARS-CoV-2 by FDA under an Emergency Use Authorization (EUA). This EUA will remain in effect (meaning this test can be used) for the duration of the COVID-19 declaration under Section 564(b)(1) of the Act, 21 U.S.C. section 360bbb-3(b)(1), unless the authorization is terminated or revoked.     Resp Syncytial Virus by PCR NEGATIVE NEGATIVE Final    Comment: (NOTE) Fact Sheet for Patients: bloggercourse.com  Fact Sheet for Healthcare Providers: seriousbroker.it  This test is not yet approved or cleared by the [place]  FDA and  has been authorized for detection and/or diagnosis of SARS-CoV-2 by FDA under an Emergency Use Authorization (EUA). This EUA will remain in effect (meaning this test can be used) for the duration of the COVID-19 declaration under Section 564(b)(1) of the Act, 21 U.S.C. section 360bbb-3(b)(1), unless the authorization is terminated or revoked.  Performed at [facility], [address] [place], [place] [address]   MRSA Next Gen by PCR, Nasal     Status: None   Collection Time: 11/16/24 12:45 PM   Specimen: Nasal Mucosa; Nasal Swab  Result Value Ref Range Status   MRSA by PCR Next Gen NOT DETECTED NOT DETECTED Final    Comment: (NOTE) The GeneXpert MRSA Assay (FDA approved for NASAL specimens only), is one component of a comprehensive MRSA colonization surveillance program. It is not intended to diagnose MRSA infection nor to guide or monitor treatment for MRSA infections. Test performance is not FDA approved in patients less than 51 years old. Performed at [facility], [address] [place], [place] [address]       Labs: Basic Metabolic Panel: Recent Labs  Lab 11/16/24 0904  NA 131*  K 3.8  CL 95*  CO2 27  GLUCOSE 140*  BUN 9  CREATININE 0.79  CALCIUM  9.3   Liver Function Tests: No results for input(s): AST, ALT, ALKPHOS, BILITOT, PROT, ALBUMIN in the last 168 hours. No results for input(s): LIPASE, AMYLASE in the last 168 hours. No results for input(s): AMMONIA in the last 168 hours. CBC: Recent Labs  Lab 11/16/24 0904 11/17/24 0818  WBC 6.8 3.7*  HGB 12.9 15.0  HCT 39.3 44.7  MCV 92.7 91.6  PLT 204 277   Cardiac Enzymes: No results for input(s): CKTOTAL, CKMB, CKMBINDEX, TROPONINI in the last 168 hours. BNP: BNP (last 3 results) No results for input(s): BNP in the last 8760 hours.  ProBNP (last 3 results) Recent Labs    11/16/24 0904  PROBNP 1,132.0*    CBG: No results for input(s): GLUCAP in the last 168 hours.     Signed:  Devaughn KATHEE Ban MD.  [facility] 11/17/2024, 8:59 AM

## 2024-11-17 NOTE — Plan of Care (Signed)
   Problem: Clinical Measurements: Goal: Ability to maintain clinical measurements within normal limits will improve Outcome: Progressing Goal: Will remain free from infection Outcome: Progressing Goal: Diagnostic test results will improve Outcome: Progressing Goal: Respiratory complications will improve Outcome: Progressing Goal: Cardiovascular complication will be avoided Outcome: Progressing   Problem: Safety: Goal: Ability to remain free from injury will improve Outcome: Progressing

## 2024-11-26 ENCOUNTER — Telehealth: Payer: Self-pay

## 2024-11-26 ENCOUNTER — Ambulatory Visit: Admitting: Internal Medicine

## 2024-11-26 NOTE — Telephone Encounter (Signed)
 DOS: 08/17/24, C3-4 and C6-7 ACDF with [facility]  Patient called to complain of 8/10 constant pain in the left side of neck and shoulder into side of her head. States it feels like her head is vibrating when she breathes.  Pain started about a month after surgery. Started intermittent but has progressed into constant pain.   Additionally states that she is now experiencing new dizziness, which started with medication changes yesterday. Has not taken baclofen before.    Current Regimen: Tylenol  - not taking it Gabapentin  - 800 TID Robaxin  - not taking  Baclofen - 10mg  TID - making her dizzy Tramadol  - 1 tab Q6 scheduled- she took 3 pills at a time and it did not help. (States oxy 10mg  worked so much better) Prednisone  - 2 tabs daily for 5D.   Pharmacy: [facility] [place]

## 2024-11-26 NOTE — Telephone Encounter (Signed)
 Called patient to discuss medication recommendations. Educated on taking medications as prescribed and not to take more tramadol  than prescribed. Advised restarting tylenol  and changing back to robaxin  from baclofen. Patient states her pain is improved from this morning and is agreeable to giving medication changes time to work. Scheduled 6 month follow up appointment. Advised to reach back out if pain worsens or new symptoms develop.

## 2024-11-27 ENCOUNTER — Telehealth: Payer: Self-pay | Admitting: Neurosurgery

## 2024-11-27 NOTE — Telephone Encounter (Signed)
 Spoke with patient. Patient spoke with Izetta yesterday-12/15-about same symptoms. Patient continues to have left sided neck pain that radiates to the left side of her head. She switched her medications a suggested yesterday Baclofen to Robaxin  500 mg 1 every 8 hours, she started taking Tylenol  650 mg 2 tablets every 8 hours and plus other medication regimen below: Current Regimen: Gabapentin  - 800 TID Tramadol  - 1 tab Q6 scheduled- she took 3 pills at a time and it did not help. (States oxy 10mg  worked so much better) Prednisone  - 2 tabs daily for 5D.   Patient states when she was seen last 11/12/24 she had these symptoms but they are getting worse. Pain is not controlled. She is also stating the 2 knots/swelling area behind and below her left ear have gotten bigger and are sore to the touch. No redness present. No fever. No trouble swallowing. She had these knots at her LOV also they are just bigger. She is not sure if that is related to anything.  She would like to know what to do about the pain and what else she can try. When pain radiates to her head it gets so intense. NO more dizziness

## 2024-11-27 NOTE — Telephone Encounter (Signed)
 Patient called to let our office know that she is having 9/10 pain in her head and neck. She states that when she wakes up/ stands up she is dizzy. She finally states that she went to the walk in clinic at [facility] and they advised her that it was a pulled muscle. She wants to be sure there is nothing else going on and would like to know what she can do for her pain. Please advise.

## 2024-11-28 ENCOUNTER — Ambulatory Visit

## 2024-11-28 ENCOUNTER — Other Ambulatory Visit: Payer: Self-pay | Admitting: Physician Assistant

## 2024-11-28 DIAGNOSIS — M7552 Bursitis of left shoulder: Secondary | ICD-10-CM

## 2024-11-28 DIAGNOSIS — Z981 Arthrodesis status: Secondary | ICD-10-CM

## 2024-11-28 DIAGNOSIS — M542 Cervicalgia: Secondary | ICD-10-CM | POA: Diagnosis not present

## 2024-11-28 DIAGNOSIS — M7061 Trochanteric bursitis, right hip: Secondary | ICD-10-CM

## 2024-11-28 NOTE — Telephone Encounter (Signed)
 Patient has been added to wait list.

## 2024-11-28 NOTE — Progress Notes (Signed)
 Orthopedic Follow-Up Note  Follow-up left shoulder impingement and right hip greater trochanteric bursitis   SUBJECTIVE:   [name] is a 68 y.o. year old who presents for follow up of left shoulder impingement and right hip greater trochanteric bursitis.  She was last seen in office in October at 2 separate visits.  In regards to her left shoulder, we discussed physical therapy as well as provided a subacromial injection.  She states that she did not attend physical therapy as she has too many doctor appointments and it becomes too much of a burden.  She does state she received at least 60% relief of her left shoulder pain symptoms for about a month following the subacromial injection.  Overall, her shoulder feels much better.  In regards to her right hip greater trochanteric bursitis, she reports symptoms resolved on their own.  She also did not attend physical therapy for this ailment as well.  She notes today that her most significant symptoms involve her neck.  She is scheduled to see neurosurgery in the next week or so.   No new shoulder or hip complaints today.       Past Medical History:  Diagnosis Date   (HFpEF) heart failure with preserved ejection fraction (HCC)    Anxiety    Aortic atherosclerosis    Arthritis    Bilateral hand numbness    CAD (coronary artery disease)    Cervical myelopathy (HCC)    Cervical spinal stenosis    Chest pain at rest    CHF (congestive heart failure) (HCC)    Chronic pain syndrome    Chronic respiratory failure with hypoxia and hypercapnia (HCC)    CKD stage 3b, GFR 30-44 ml/min (HCC)    Community acquired pneumonia 01/10/2013   COPD (chronic obstructive pulmonary disease) (HCC)    DDD (degenerative disc disease), thoracic    Dependence on supplemental oxygen  (3L/[place] qhs PRN)    Depression    DM (diabetes mellitus), type 2 (HCC)    Dyspnea    Failed back surgical syndrome    GERD (gastroesophageal reflux disease)    Hyperlipidemia     Hypertension    Hypothyroid    Lumbar spondylosis    a.) s/p lumbar laminectomy, microdiscectomy, and L4-L5 fusion   Neuropathy    Nicotine  dependence    Normocytic anemia    OSA on CPAP    Pancreatitis 03/2024   Peripheral vascular disease    Pulmonary hypertension (HCC)    Recurrent UTI (urinary tract infection)    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  no seizure since--pt does take keppra  and office note from neurologist dr. margaret on this chart   Sepsis due to pneumonia [facility])    Sinus tachycardia    Tobacco abuse    Urinary incontinence    Wound of left leg    being seen at wound clinic as of 03-23-24   Past Surgical History:  Procedure Laterality Date   ANTERIOR CERVICAL DECOMP/DISCECTOMY FUSION N/A 08/17/2024   Procedure: ANTERIOR CERVICAL DECOMPRESSION/DISCECTOMY FUSION 2 LEVELS;  Surgeon: Clois Fret, MD;  Location: [facility] ORS;  Service: Neurosurgery;  Laterality: N/A;  C3-4 & C6-7 ANTERIOR CERVICAL DISCECTOMY AND FUSION  CARPAL TUNNEL RELEASE Left    COLONOSCOPY WITH PROPOFOL  N/A 06/17/2022   Procedure: COLONOSCOPY WITH PROPOFOL ;  Surgeon: Maryruth Ole DASEN, MD;  Location: [facility];  Service: Endoscopy;  Laterality: N/A;   JOINT REPLACEMENT  08/26/2011   right total knee arthroplasty, left total knee   KNEE ARTHROPLASTY  10/28/2011   Procedure: COMPUTER ASSISTED TOTAL KNEE ARTHROPLASTY;  Surgeon: Norleen CROME Rendall III;  Location: [facility];  Service: Orthopedics;  Laterality: Left;  preop femoral nerve block   LAMINECTOMY AND MICRODISCECTOMY LUMBAR SPINE N/A    Procedure: LUMBAR LAMINECTOMY, MICRODISCECTOMY, AND L4-L5 FUSION   OPEN REDUCTION INTERNAL FIXATION (ORIF) METACARPAL Left 01/30/2024    Procedure: OPEN REDUCTION INTERNAL FIXATION (ORIF) OF LEFT 5TH METACARPAL SHAFT FRACTURE.;  Surgeon: Edie Norleen PARAS, MD;  Location: [facility] OR[facility];  Service: Orthopedics;  Laterality: Left;   PROCTOSCOPY N/A 06/18/2022   Procedure: RIGID PROCTOSCOPY;  Surgeon: Sheldon Standing, MD;  Location: [facility];  Service: General;  Laterality: N/A;   Current Medications[1] Allergies[2] Social History   Socioeconomic History   Marital status: Widowed    Spouse name: Not on file   Number of children: Not on file   Years of education: Not on file   Highest education level: Not on file  Occupational History   Not on file  Tobacco Use   Smoking status: Every Day    Current packs/day: 2.00    Average packs/day: 2.0 packs/day for 50.0 years (100.0 ttl pk-yrs)    Types: Cigarettes    Passive exposure: Current   Smokeless tobacco: Never  Vaping Use   Vaping status: Every Day   Substances: Nicotine , Flavoring  Substance and Sexual Activity   Alcohol  use: Yes    Comment: rare   Drug use: No   Sexual activity: Yes    Birth control/protection: Post-menopausal  Other Topics Concern   Not on file  Social History Narrative   ** Merged History Encounter **       Social Drivers of Health   Tobacco Use: High Risk (11/25/2024)   Received from [facility]   Patient History    Smoking Tobacco Use: Every Day    Smokeless Tobacco Use: Never    Passive Exposure: Not on file  Financial Resource Strain: Low Risk  (03/09/2024)   Received from [facility]   Overall Financial Resource Strain (CARDIA)    Difficulty of Paying Living Expenses: Not hard at all  Food Insecurity: No Food Insecurity (11/16/2024)   Epic    Worried About Radiation Protection Practitioner of Food in the Last Year: Never true    Ran Out of Food in the Last Year: Never true  Transportation Needs: No Transportation Needs (11/16/2024)   Epic    Lack of Transportation (Medical): No    Lack of Transportation (Non-Medical): No   Physical Activity: Not on file  Stress: Not on file  Social Connections: Socially Isolated (11/16/2024)   Social Connection and Isolation Panel    Frequency of Communication with Friends and Family: More than three times a week    Frequency of Social Gatherings with Friends and Family: Once a week    Attends Religious Services: Never    Database Administrator or Organizations: No    Attends Banker Meetings: Never    Marital Status: Widowed  Intimate Partner Violence: Not At Risk (11/16/2024)   Epic    Fear of Current or Ex-Partner: No    Emotionally Abused: No  Physically Abused: No    Sexually Abused: No  Depression (PHQ2-9): High Risk (07/12/2024)   Depression (PHQ2-9)    PHQ-2 Score: 12  Alcohol  Screen: Low Risk (12/22/2022)   Alcohol  Screen    Last Alcohol  Screening Score (AUDIT): 1  Housing: Low Risk (11/16/2024)   Epic    Unable to Pay for Housing in the Last Year: No    Number of Times Moved in the Last Year: 0    Homeless in the Last Year: No  Utilities: Not At Risk (11/16/2024)   Epic    Threatened with loss of utilities: No  Health Literacy: Not on file   Family History  Problem Relation Age of Onset   Heart attack Father 30   Alzheimer's disease Mother    Breast cancer Neg Hx      ROS: A review of systems was performed and is negative unless stated above in HPI    OBJECTIVE:    Constitutional:   The patient is alert and oriented x 3, appears to be stated age and in no distress.   Orthopaedic Examination:   Right Hip Examination:  On focused examination of the right hip, no obvious deformity noted.  Minimal tenderness about the right gluteal region and piriformis.  Tenderness over the right greater trochanter- improved from previous examination.  Patient with smooth arc of motion in the hip with the leg flexed to 90 degrees in internal and external rotation without pain.  5 out of 5 motor strength in hip flexion, knee flexion, knee extension,  ankle plantarflexion and ankle dorsiflexion.  Sensation intact to light touch distally about the right lower extremity.  Lower extremity is warm and well-perfused.    Shoulder focused exam:   Skin is intact about the left shoulder. No obvious deformity, ecchymosis or erythema present.       LEFT  Scapula Atrophy Negative    Winging Negative  Rotator cuff Supraspinatus 5/5    Infraspinatus 5/5    Subscapularis 5/5  AROM/PROM (degrees) FF 0-150 / 0-160    ER0 0-60 / 0-60    IR(back) T10 / T10  Palpation (pain): AC negative    Biceps negative    Coracoid negative  Special Tests: O'Briens negative    Cross body Adduction positive    Speeds  negative    Jobe's negative    Neer negative    Hawkins negative    Belly Press negative  Other: Lateral deltoid 5/5      Vascular/Lymphatic: Fingers warm and well perfused with 2+ radial pulse.     Neurologic: Sensation intact to the Median, Ulnar and Radial nerve distribution of the hand. Sensation intact to lateral deltoid (axillary nerve).           IMAGING:   No new imaging available for review at today's encounter     ASSESSMENT:  Left shoulder impingement -improved  Right hip greater trochanteric bursitis -improved      PLAN:   Patient was seen in office today for follow up of left shoulder impingement and right hip greater trochanteric bursitis. Since last encounter, patient reports symptom improvement in both the left shoulder and right hip. Interventions attempted include left shoulder subacromial injection.  Physical therapy was also offered for the left shoulder and right hip however patient did not attend stating too burdensome with other commitments.  We did offer new referral for physical therapy however patient is doing well and would like to defer for now.  We also informed  patient that she cannot receive a repeat left shoulder injection for at least 3 months from last injection (January 2026).  She states that her  primary focus is her neck at this time.  She did have recent cervical spine imaging and does plan on following up with neurosurgery for her ailments.  In regards to her left shoulder and right hip, she may follow-up as needed for any future issues.  Follow-up as needed  All questions and concerns were answered to the best of my ability.  Patient can call any time with further concerns.  I discussed with the patient today that I will be transitioning out of my role within the near future. In order to provide appropriate continuity of care, we offered the patient options for follow up regarding their orthopedic concerns. Patient has chosen to follow up with [facility].  Patient may reach out to our office if there are any difficulties in scheduling follow up care. The patient understands who to contact for future orthopedic concerns and has contact information for the receiving practice.   Arlyss Schneider, DO Orthopedic Surgery & Sports Medicine [facility]     [1]  Current Outpatient Medications:    acetaminophen  (TYLENOL ) 650 MG CR tablet, Take 2 tablets (1,300 mg total) by mouth every 8 (eight) hours as needed for pain. Home med., Disp: , Rfl:    albuterol  (VENTOLIN  HFA) 108 (90 Base) MCG/ACT inhaler, Inhale 2 puffs into the lungs every 4 (four) hours as needed for wheezing or shortness of breath., Disp: 18 g, Rfl: 11   budesonide -glycopyrrolate -formoterol  (BREZTRI ) 160-9-4.8 MCG/ACT AERO inhaler, Inhale 2 puffs into the lungs in the morning and at bedtime., Disp: , Rfl:    busPIRone  (BUSPAR ) 30 MG tablet, Take 1 tablet (30 mg total) by mouth 2 (two) times daily., Disp: 180 tablet, Rfl: 1   Cholecalciferol  (VITAMIN D -3) 25 MCG (1000 UT) CAPS, Take 1,000 Units by mouth daily., Disp: , Rfl:    cloNIDine  (CATAPRES ) 0.3 MG tablet, Take 1 tablet (0.3 mg total) by mouth 2 (two) times daily., Disp: 60 tablet, Rfl: 0   cyanocobalamin  (VITAMIN B12) 1000 MCG tablet, TAKE 1 TABLET BY MOUTH EVERY  DAY *NEW PRESCRIPTION REQUEST*, Disp: 90 tablet, Rfl: 3   diltiazem  (CARDIZEM  CD) 180 MG 24 hr capsule, Take 180 mg by mouth every morning., Disp: , Rfl:    escitalopram  (LEXAPRO ) 20 MG tablet, TAKE 1 TABLET (20 MG TOTAL) BY MOUTH EVERY MORNING., Disp: 90 tablet, Rfl: 1   fenofibrate  micronized (LOFIBRA) 134 MG capsule, TAKE 1 CAPSULE BY MOUTH EVERY DAY BEFORE BREAKFAST, Disp: 90 capsule, Rfl: 1   fluticasone  (FLONASE ) 50 MCG/ACT nasal spray, Place 1 spray into both nostrils daily as needed for allergies., Disp: , Rfl:    gabapentin  (NEURONTIN ) 800 MG tablet, TAKE 1 TABLET BY MOUTH THREE TIMES DAILY, Disp: 270 tablet, Rfl: 1   levothyroxine  (SYNTHROID ) 25 MCG tablet, TAKE 1 TABLET BY MOUTH ONCE DAILY, Disp: 90 tablet, Rfl: 1   lidocaine -prilocaine (EMLA) cream, Apply 1 Application topically 4 (four) times daily as needed (pain)., Disp: , Rfl:    methocarbamol  (ROBAXIN ) 500 MG tablet, Take 1 tablet (500 mg total) by mouth every 6 (six) hours as needed for muscle spasms., Disp: 120 tablet, Rfl: 0   minoxidil  (LONITEN ) 10 MG tablet, TAKE 1 TABLET BY MOUTH EVERY DAY, Disp: 90 tablet, Rfl: 1   naphazoline-glycerin  (CLEAR EYES REDNESS) 0.012-0.25 % SOLN, Place 1-2 drops into both eyes 4 (four) times daily as needed  for eye irritation., Disp: , Rfl:    olmesartan  (BENICAR ) 40 MG tablet, TAKE 1 TABLET BY MOUTH EVERY DAY, Disp: 90 tablet, Rfl: 1   ondansetron  (ZOFRAN -ODT) 4 MG disintegrating tablet, Take 1 tablet (4 mg total) by mouth every 6 (six) hours., Disp: 30 tablet, Rfl: 0   oxyCODONE  (OXY IR/ROXICODONE ) 5 MG immediate release tablet, Take 1 tablet (5 mg total) by mouth every 6 (six) hours as needed for severe pain (pain score 7-10)., Disp: 20 tablet, Rfl: 0   OXYGEN , Inhale 3 L into the lungs See admin instructions. Inhale 3L of oxygen  throughout the day as needed and at night, Disp: , Rfl:    pantoprazole  (PROTONIX ) 40 MG tablet, TAKE 1 TABLET BY MOUTH EVERY DAY IN THE MORNING, Disp: 90 tablet, Rfl:  1   pramipexole  (MIRAPEX ) 0.25 MG tablet, TAKE 1 TABLET BY MOUTH 3 TIMES DAILY, Disp: 270 tablet, Rfl: 1   pyridOXINE (VITAMIN B6) 100 MG tablet, TAKE 1 TABLET BY MOUTH EVERY DAY *NEW PRESCRIPTION REQUEST*, Disp: 90 tablet, Rfl: 3 No current facility-administered medications for this visit.  Facility-Administered Medications Ordered in Other Visits:    acetaminophen  (TYLENOL ) tablet 1,000 mg, 1,000 mg, Oral, Once, Vicci, Camellia Glatter, MD   chlorhexidine  (HIBICLENS ) 4 % liquid 4 application, 60 mL, Topical, Once, Duffy, Wonda, PA-C   chlorhexidine  (HIBICLENS ) 4 % liquid 4 application, 60 mL, Topical, Once, Duffy, Wonda, PA-C   chlorhexidine  (PERIDEX ) 0.12 % solution 15 mL, 15 mL, Mouth/Throat, Once **OR** Oral care mouth rinse, 15 mL, Mouth Rinse, Once, Piscitello, Fairy POUR, MD [2]  Allergies Allergen Reactions   Nsaids Other (See Comments)    Pt states it messes up her kidneys But takes aspirin  & ibuprofen    Thiazide-Type Diuretics Other (See Comments)    Other reaction(s): Due to CKD

## 2024-11-28 NOTE — Telephone Encounter (Signed)
 Patient states would like xray of her neck and side of her head -where the lumps/knots are behind her left ear and left side of the neck. I have patient scheduled for 3:30 today for xrays as she is coming in to see Dr Gust at 4 pm today for follow up of her shoulder. Virtual visit scheduled with Brooke for Monday 12/22

## 2024-11-28 NOTE — Telephone Encounter (Signed)
 Patient called the office this morning to set up an appointment. She did not want to wait until January, advised to also call primary care provider.   Patient would like to know if she is able to have a xray ordered and then make a virtual appointment to discuss the results? Please advise.

## 2024-11-29 ENCOUNTER — Telehealth: Payer: Self-pay | Admitting: Physician Assistant

## 2024-11-29 ENCOUNTER — Telehealth: Admitting: Physician Assistant

## 2024-11-29 DIAGNOSIS — M542 Cervicalgia: Secondary | ICD-10-CM

## 2024-11-29 NOTE — Telephone Encounter (Signed)
 Pt called into office and wanted to know if we can call her in anything stronger then Tramadol ? Was read the message that was sent to her on the 16th stating that we will not be giving any more narcotics and informed her that she does have a MyChart visit Monday at 12:30.

## 2024-11-29 NOTE — Progress Notes (Signed)
 Virtual Visit Consent   [name], you are scheduled for a virtual visit with a [facility] provider today. Just as with appointments in the office, your consent must be obtained to participate. Your consent will be active for this visit and any virtual visit you may have with one of our providers in the next 365 days. If you have a MyChart account, a copy of this consent can be sent to you electronically.  As this is a virtual visit, video technology does not allow for your provider to perform a traditional examination. This may limit your provider's ability to fully assess your condition. If your provider identifies any concerns that need to be evaluated in person or the need to arrange testing (such as labs, EKG, etc.), we will make arrangements to do so. Although advances in technology are sophisticated, we cannot ensure that it will always work on either your end or our end. If the connection with a video visit is poor, the visit may have to be switched to a telephone visit. With either a video or telephone visit, we are not always able to ensure that we have a secure connection.  By engaging in this virtual visit, you consent to the provision of healthcare and authorize for your insurance to be billed (if applicable) for the services provided during this visit. Depending on your insurance coverage, you may receive a charge related to this service.  I need to obtain your verbal consent now. Are you willing to proceed with your visit today? [name] has provided verbal consent on 11/29/2024 for a virtual visit (video or telephone). Elsie Velma Lunger, [place]  Date: 11/29/2024 1:23 PM   Virtual Visit via Video Note   I, Elsie Velma Lunger, connected with  [name]  ([id number], [birth year]) on 11/29/2024 at  1:30 PM EST by a video-enabled telemedicine application and verified that I am speaking with the correct person using two identifiers.  Location: Patient: Virtual Visit  Location Patient: Home Provider: Virtual Visit Location Provider: Home Office   I discussed the limitations of evaluation and management by telemedicine and the availability of in person appointments. The patient expressed understanding and agreed to proceed.    History of Present Illness: [name] is a 68 y.o. who identifies as a female who was assigned female at birth, and is being seen today for worsening of acute on chronic left-sided neck pain with radiation into her jaw and associated headache. Was evaluated at PCP office 4 days ago and prescribed course of prednisone , muscle relaxant and Tramadol . Denies any improvement with this treatments. Spoke to her Neurosurgeon yesterday who ordered cervical x-ray (results pending). Notes pain is severe. Denies radiation into LUE. Denies confusion.   HPI: HPI  Problems:  Patient Active Problem List   Diagnosis Date Noted   Influenza A 11/17/2024   S/P cervical spinal fusion 08/17/2024   Cervical myelopathy (HCC) 08/17/2024   Spinal stenosis of cervical region 08/17/2024   COPD exacerbation (HCC) 07/27/2024   Cervical radiculopathy 04/17/2024   Osteoarthritis of spine with radiculopathy, cervical region 04/17/2024   Pancreatitis, acute 03/28/2024   Pancreatitis 03/28/2024   Acute maxillary sinusitis 11/28/2023   Prediabetes 10/21/2023   Mixed hyperlipidemia 10/21/2023   Onychomycosis 08/10/2023   Back pain 05/24/2023   Cellulitis of left lower extremity 04/26/2023   Infected laceration 04/26/2023   Stage 3b chronic kidney disease (HCC) 04/26/2023   Hyperkalemia 04/26/2023   Sepsis due to pneumonia (HCC) 07/06/2022  Severe sepsis (HCC) 07/06/2022   Hypothyroidism, unspecified 07/06/2022   Normocytic anemia 06/19/2022   Complete rectal prolapse 06/18/2022   Chronic respiratory failure with hypoxia (HCC) 06/18/2022   GERD (gastroesophageal reflux disease) 04/06/2022   Restless leg syndrome 04/06/2022   AKI (acute kidney injury)  04/06/2022   Delayed gastric emptying 04/06/2022   Anxiety and depression 01/25/2021   Essential hypertension 01/25/2021   Acquired hypothyroidism 01/25/2021   Acute on chronic respiratory failure with hypoxia (HCC) 11/17/2020   Protein calorie malnutrition 11/17/2020   Hypothyroidism 11/17/2020   Leg pain 02/05/2020   Myofascial pain syndrome 08/23/2018   Lumbar spondylosis 08/23/2018   Leg swelling 07/31/2018   Tobacco abuse 07/31/2018   Fusion of lumbar spine (L3-S1) 07/20/2018   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   Lumbar degenerative disc disease 07/20/2018   Chronic pain syndrome 04/18/2014   Respiratory failure (HCC) 04/14/2014   UTI (urinary tract infection) 04/28/2013   Weakness generalized 04/28/2013   Chronic diastolic CHF (congestive heart failure) (HCC) 04/28/2013   Sinus tachycardia 01/11/2013   COPD with acute exacerbation (HCC) 01/10/2013   Diastolic CHF, acute on chronic (HCC) 01/10/2013   Osteoarthritis of left knee 10/27/2011   COPD (chronic obstructive pulmonary disease) (HCC) 03/29/2011   Hypertension 03/29/2011    Allergies: Allergies[1] Medications: Current Medications[2]  Observations/Objective: Patient is well-developed, well-nourished in no acute distress.  Resting comfortably  at home.  Head is normocephalic, atraumatic.  No labored breathing.  Speech is clear and coherent with logical content.  Patient is alert and oriented at baseline.   Assessment and Plan: 1. Cervicalgia (Primary)  Non responsive to steroid, muscle relaxant and Tramadol . X-ray pending. Needs follow-up with PCP/Neurosurgeon for further evaluation and management giving limitations in what we can prescribe via a virtual urgent care visit. She agrees to be evaluated in person ASAP.  Follow Up Instructions: I discussed the assessment and treatment plan with the patient. The patient was provided an opportunity to ask questions and all were answered. The patient agreed with  the plan and demonstrated an understanding of the instructions.  A copy of instructions were sent to the patient via MyChart unless otherwise noted below.   The patient was advised to call back or seek an in-person evaluation if the symptoms worsen or if the condition fails to improve as anticipated.    Elsie Velma Lunger, PA-C    [1]  Allergies Allergen Reactions   Nsaids Other (See Comments)    Pt states it messes up her kidneys But takes aspirin  & ibuprofen    Thiazide-Type Diuretics Other (See Comments)    Other reaction(s): Due to CKD  [2]  Current Outpatient Medications:    acetaminophen  (TYLENOL ) 650 MG CR tablet, Take 2 tablets (1,300 mg total) by mouth every 8 (eight) hours as needed for pain. Home med., Disp: , Rfl:    albuterol  (VENTOLIN  HFA) 108 (90 Base) MCG/ACT inhaler, Inhale 2 puffs into the lungs every 4 (four) hours as needed for wheezing or shortness of breath., Disp: 18 g, Rfl: 11   budesonide -glycopyrrolate -formoterol  (BREZTRI ) 160-9-4.8 MCG/ACT AERO inhaler, Inhale 2 puffs into the lungs in the morning and at bedtime., Disp: , Rfl:    busPIRone  (BUSPAR ) 30 MG tablet, Take 1 tablet (30 mg total) by mouth 2 (two) times daily., Disp: 180 tablet, Rfl: 1   Cholecalciferol  (VITAMIN D -3) 25 MCG (1000 UT) CAPS, Take 1,000 Units by mouth daily., Disp: , Rfl:    cloNIDine  (CATAPRES ) 0.3 MG tablet, Take 1 tablet (0.3  mg total) by mouth 2 (two) times daily., Disp: 60 tablet, Rfl: 0   cyanocobalamin  (VITAMIN B12) 1000 MCG tablet, TAKE 1 TABLET BY MOUTH EVERY DAY *NEW PRESCRIPTION REQUEST*, Disp: 90 tablet, Rfl: 3   diltiazem  (CARDIZEM  CD) 180 MG 24 hr capsule, Take 180 mg by mouth every morning., Disp: , Rfl:    escitalopram  (LEXAPRO ) 20 MG tablet, TAKE 1 TABLET (20 MG TOTAL) BY MOUTH EVERY MORNING., Disp: 90 tablet, Rfl: 1   fenofibrate  micronized (LOFIBRA) 134 MG capsule, TAKE 1 CAPSULE BY MOUTH EVERY DAY BEFORE BREAKFAST, Disp: 90 capsule, Rfl: 1   fluticasone  (FLONASE ) 50  MCG/ACT nasal spray, Place 1 spray into both nostrils daily as needed for allergies., Disp: , Rfl:    gabapentin  (NEURONTIN ) 800 MG tablet, TAKE 1 TABLET BY MOUTH THREE TIMES DAILY, Disp: 270 tablet, Rfl: 1   levothyroxine  (SYNTHROID ) 25 MCG tablet, TAKE 1 TABLET BY MOUTH ONCE DAILY, Disp: 90 tablet, Rfl: 1   lidocaine -prilocaine (EMLA) cream, Apply 1 Application topically 4 (four) times daily as needed (pain)., Disp: , Rfl:    methocarbamol  (ROBAXIN ) 500 MG tablet, Take 1 tablet (500 mg total) by mouth every 6 (six) hours as needed for muscle spasms., Disp: 120 tablet, Rfl: 0   minoxidil  (LONITEN ) 10 MG tablet, TAKE 1 TABLET BY MOUTH EVERY DAY, Disp: 90 tablet, Rfl: 1   naphazoline-glycerin  (CLEAR EYES REDNESS) 0.012-0.25 % SOLN, Place 1-2 drops into both eyes 4 (four) times daily as needed for eye irritation., Disp: , Rfl:    olmesartan  (BENICAR ) 40 MG tablet, TAKE 1 TABLET BY MOUTH EVERY DAY, Disp: 90 tablet, Rfl: 1   ondansetron  (ZOFRAN -ODT) 4 MG disintegrating tablet, Take 1 tablet (4 mg total) by mouth every 6 (six) hours., Disp: 30 tablet, Rfl: 0   oxyCODONE  (OXY IR/ROXICODONE ) 5 MG immediate release tablet, Take 1 tablet (5 mg total) by mouth every 6 (six) hours as needed for severe pain (pain score 7-10)., Disp: 20 tablet, Rfl: 0   OXYGEN , Inhale 3 L into the lungs See admin instructions. Inhale 3L of oxygen  throughout the day as needed and at night, Disp: , Rfl:    pantoprazole  (PROTONIX ) 40 MG tablet, TAKE 1 TABLET BY MOUTH EVERY DAY IN THE MORNING, Disp: 90 tablet, Rfl: 1   pramipexole  (MIRAPEX ) 0.25 MG tablet, TAKE 1 TABLET BY MOUTH 3 TIMES DAILY, Disp: 270 tablet, Rfl: 1   pyridOXINE (VITAMIN B6) 100 MG tablet, TAKE 1 TABLET BY MOUTH EVERY DAY *NEW PRESCRIPTION REQUEST*, Disp: 90 tablet, Rfl: 3 No current facility-administered medications for this visit.  Facility-Administered Medications Ordered in Other Visits:    acetaminophen  (TYLENOL ) tablet 1,000 mg, 1,000 mg, Oral, Once,  Vicci, Camellia Glatter, MD   chlorhexidine  (HIBICLENS ) 4 % liquid 4 application, 60 mL, Topical, Once, Duffy, Wonda, PA-C   chlorhexidine  (HIBICLENS ) 4 % liquid 4 application, 60 mL, Topical, Once, Duffy, Wonda, PA-C   chlorhexidine  (PERIDEX ) 0.12 % solution 15 mL, 15 mL, Mouth/Throat, Once **OR** Oral care mouth rinse, 15 mL, Mouth Rinse, Once, Piscitello, Fairy POUR, MD

## 2024-11-29 NOTE — Patient Instructions (Signed)
 Erminio GORMAN King, thank you for joining Elsie Velma Lunger, PA-C for today's virtual visit.  While this provider is not your primary care provider (PCP), if your PCP is located in our provider database this encounter information will be shared with them immediately following your visit.   A [facility] MyChart account gives you access to today's visit and all your visits, tests, and labs performed at [facility]  click here if you don't have a [facility] MyChart account or go to [email or web address]  Consent: (Patient) [name] provided verbal consent for this virtual visit at the beginning of the encounter.  Current Medications:  Current Outpatient Medications:    acetaminophen  (TYLENOL ) 650 MG CR tablet, Take 2 tablets (1,300 mg total) by mouth every 8 (eight) hours as needed for pain. Home med., Disp: , Rfl:    albuterol  (VENTOLIN  HFA) 108 (90 Base) MCG/ACT inhaler, Inhale 2 puffs into the lungs every 4 (four) hours as needed for wheezing or shortness of breath., Disp: 18 g, Rfl: 11   budesonide -glycopyrrolate -formoterol  (BREZTRI ) 160-9-4.8 MCG/ACT AERO inhaler, Inhale 2 puffs into the lungs in the morning and at bedtime., Disp: , Rfl:    busPIRone  (BUSPAR ) 30 MG tablet, Take 1 tablet (30 mg total) by mouth 2 (two) times daily., Disp: 180 tablet, Rfl: 1   Cholecalciferol  (VITAMIN D -3) 25 MCG (1000 UT) CAPS, Take 1,000 Units by mouth daily., Disp: , Rfl:    cloNIDine  (CATAPRES ) 0.3 MG tablet, Take 1 tablet (0.3 mg total) by mouth 2 (two) times daily., Disp: 60 tablet, Rfl: 0   cyanocobalamin  (VITAMIN B12) 1000 MCG tablet, TAKE 1 TABLET BY MOUTH EVERY DAY *NEW PRESCRIPTION REQUEST*, Disp: 90 tablet, Rfl: 3   diltiazem  (CARDIZEM  CD) 180 MG 24 hr capsule, Take 180 mg by mouth every morning., Disp: , Rfl:    escitalopram  (LEXAPRO ) 20 MG tablet, TAKE 1 TABLET (20 MG TOTAL) BY MOUTH EVERY MORNING., Disp: 90 tablet, Rfl: 1   fenofibrate  micronized (LOFIBRA) 134 MG capsule,  TAKE 1 CAPSULE BY MOUTH EVERY DAY BEFORE BREAKFAST, Disp: 90 capsule, Rfl: 1   fluticasone  (FLONASE ) 50 MCG/ACT nasal spray, Place 1 spray into both nostrils daily as needed for allergies., Disp: , Rfl:    gabapentin  (NEURONTIN ) 800 MG tablet, TAKE 1 TABLET BY MOUTH THREE TIMES DAILY, Disp: 270 tablet, Rfl: 1   levothyroxine  (SYNTHROID ) 25 MCG tablet, TAKE 1 TABLET BY MOUTH ONCE DAILY, Disp: 90 tablet, Rfl: 1   lidocaine -prilocaine (EMLA) cream, Apply 1 Application topically 4 (four) times daily as needed (pain)., Disp: , Rfl:    methocarbamol  (ROBAXIN ) 500 MG tablet, Take 1 tablet (500 mg total) by mouth every 6 (six) hours as needed for muscle spasms., Disp: 120 tablet, Rfl: 0   minoxidil  (LONITEN ) 10 MG tablet, TAKE 1 TABLET BY MOUTH EVERY DAY, Disp: 90 tablet, Rfl: 1   naphazoline-glycerin  (CLEAR EYES REDNESS) 0.012-0.25 % SOLN, Place 1-2 drops into both eyes 4 (four) times daily as needed for eye irritation., Disp: , Rfl:    olmesartan  (BENICAR ) 40 MG tablet, TAKE 1 TABLET BY MOUTH EVERY DAY, Disp: 90 tablet, Rfl: 1   ondansetron  (ZOFRAN -ODT) 4 MG disintegrating tablet, Take 1 tablet (4 mg total) by mouth every 6 (six) hours., Disp: 30 tablet, Rfl: 0   oxyCODONE  (OXY IR/ROXICODONE ) 5 MG immediate release tablet, Take 1 tablet (5 mg total) by mouth every 6 (six) hours as needed for severe pain (pain score 7-10)., Disp: 20 tablet, Rfl: 0   OXYGEN , Inhale 3  L into the lungs See admin instructions. Inhale 3L of oxygen  throughout the day as needed and at night, Disp: , Rfl:    pantoprazole  (PROTONIX ) 40 MG tablet, TAKE 1 TABLET BY MOUTH EVERY DAY IN THE MORNING, Disp: 90 tablet, Rfl: 1   pramipexole  (MIRAPEX ) 0.25 MG tablet, TAKE 1 TABLET BY MOUTH 3 TIMES DAILY, Disp: 270 tablet, Rfl: 1   pyridOXINE (VITAMIN B6) 100 MG tablet, TAKE 1 TABLET BY MOUTH EVERY DAY *NEW PRESCRIPTION REQUEST*, Disp: 90 tablet, Rfl: 3 No current facility-administered medications for this visit.  Facility-Administered  Medications Ordered in Other Visits:    acetaminophen  (TYLENOL ) tablet 1,000 mg, 1,000 mg, Oral, Once, Vicci, Camellia Glatter, MD   chlorhexidine  (HIBICLENS ) 4 % liquid 4 application, 60 mL, Topical, Once, Duffy, Karin, PA-C   chlorhexidine  (HIBICLENS ) 4 % liquid 4 application, 60 mL, Topical, Once, Duffy, Wonda, PA-C   chlorhexidine  (PERIDEX ) 0.12 % solution 15 mL, 15 mL, Mouth/Throat, Once **OR** Oral care mouth rinse, 15 mL, Mouth Rinse, Once, Piscitello, Fairy POUR, MD   Medications ordered in this encounter:  No orders of the defined types were placed in this encounter.    *If you need refills on other medications prior to your next appointment, please contact your pharmacy*  Follow-Up: Call back or seek an in-person evaluation if the symptoms worsen or if the condition fails to improve as anticipated.  [facility] [phone]  Other Instructions Please call your providers ASAP for further evaluation and management.  If not available, please use link below to get appt at one of our same-day clinics.   If you have been instructed to have an in-person evaluation today at a local Urgent Care facility, please use the link below. It will take you to a list of all of our available [facility], including address, phone number and hours of operation. Please do not delay care.  [facility]  If you or a family member do not have a primary care provider, use the link below to schedule a visit and establish care. When you choose a [facility] primary care physician or advanced practice provider, you gain a long-term partner in health. Find a Primary Care Provider  Learn more about [facility]'s in-office and virtual care options: Agency - Get Care Now

## 2024-11-29 NOTE — Progress Notes (Deleted)
 New Patient Pulmonology Office Visit   Subjective:  Patient ID: [name], female    DOB: Sep 27, 1956  MRN: [id number]  Referred by: Stephanie Charlene CROME, MD  CC: No chief complaint on file.   HPI [name] is a 68 y.o. female with HTN, HLD, COPD, chronic hypoxic respiratory failure who presents for initial evaluation of the latter.    {PULM QUESTIONNAIRES (Optional):33196}  ROS  Allergies: Nsaids and Thiazide-type diuretics Current Medications[1] Past Medical History:  Diagnosis Date   (HFpEF) heart failure with preserved ejection fraction (HCC)    Anxiety    Aortic atherosclerosis    Arthritis    Bilateral hand numbness    CAD (coronary artery disease)    Cervical myelopathy (HCC)    Cervical spinal stenosis    Chest pain at rest    CHF (congestive heart failure) (HCC)    Chronic pain syndrome    Chronic respiratory failure with hypoxia and hypercapnia (HCC)    CKD stage 3b, GFR 30-44 ml/min (HCC)    Community acquired pneumonia 01/10/2013   COPD (chronic obstructive pulmonary disease) (HCC)    DDD (degenerative disc disease), thoracic    Dependence on supplemental oxygen  (3L/[place] qhs PRN)    Depression    DM (diabetes mellitus), type 2 (HCC)    Dyspnea    Failed back surgical syndrome    GERD (gastroesophageal reflux disease)    Hyperlipidemia    Hypertension    Hypothyroid    Lumbar spondylosis    a.) s/p lumbar laminectomy, microdiscectomy, and L4-L5 fusion   Neuropathy    Nicotine  dependence    Normocytic anemia    OSA on CPAP    Pancreatitis 03/2024   Peripheral vascular disease    Pulmonary hypertension (HCC)    Recurrent UTI (urinary tract infection)    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  no seizure since--pt does take keppra  and office note from neurologist dr. margaret on this chart   Sepsis due to pneumonia [facility])    Sinus tachycardia    Tobacco abuse    Urinary incontinence    Wound of left leg    being seen at wound clinic as of 03-23-24   Past Surgical History:  Procedure Laterality Date   ANTERIOR CERVICAL DECOMP/DISCECTOMY FUSION N/A 08/17/2024   Procedure: ANTERIOR CERVICAL DECOMPRESSION/DISCECTOMY FUSION 2 LEVELS;  Surgeon: Clois Fret, MD;  Location: [facility] OR[facility];  Service: Neurosurgery;  Laterality: N/A;  C3-4 & C6-7 ANTERIOR CERVICAL DISCECTOMY AND FUSION   CARPAL TUNNEL RELEASE Left    COLONOSCOPY WITH PROPOFOL  N/A 06/17/2022   Procedure: COLONOSCOPY WITH PROPOFOL ;  Surgeon: Maryruth Ole DASEN, MD;  Location: [facility];  Service: Endoscopy;  Laterality: N/A;   JOINT REPLACEMENT  08/26/2011   right total knee arthroplasty, left total knee   KNEE ARTHROPLASTY  10/28/2011   Procedure: COMPUTER ASSISTED TOTAL KNEE ARTHROPLASTY;  Surgeon: Norleen CROME Rendall III;  Location: [facility];  Service: Orthopedics;  Laterality: Left;  preop femoral nerve block   LAMINECTOMY AND MICRODISCECTOMY LUMBAR SPINE N/A    Procedure: LUMBAR LAMINECTOMY, MICRODISCECTOMY, AND L4-L5 FUSION   OPEN REDUCTION INTERNAL FIXATION (ORIF) METACARPAL Left 01/30/2024   Procedure: OPEN  REDUCTION INTERNAL FIXATION (ORIF) OF LEFT 5TH METACARPAL SHAFT FRACTURE.;  Surgeon: Edie Norleen PARAS, MD;  Location: [facility] OR[facility];  Service: Orthopedics;  Laterality: Left;   PROCTOSCOPY N/A 06/18/2022   Procedure: RIGID PROCTOSCOPY;  Surgeon: Sheldon Standing, MD;  Location: [facility];  Service: General;  Laterality: N/A;   Family History  Problem Relation Age of Onset   Heart attack Father 35   Alzheimer's disease Mother    Breast cancer Neg Hx    Social History   Socioeconomic History   Marital status: Widowed    Spouse name: Not on file   Number of children: Not on file   Years of education: Not on  file   Highest education level: Not on file  Occupational History   Not on file  Tobacco Use   Smoking status: Every Day    Current packs/day: 2.00    Average packs/day: 2.0 packs/day for 50.0 years (100.0 ttl pk-yrs)    Types: Cigarettes    Passive exposure: Current   Smokeless tobacco: Never  Vaping Use   Vaping status: Every Day   Substances: Nicotine , Flavoring  Substance and Sexual Activity   Alcohol  use: Yes    Comment: rare   Drug use: No   Sexual activity: Yes    Birth control/protection: Post-menopausal  Other Topics Concern   Not on file  Social History Narrative   ** Merged History Encounter **       Social Drivers of Health   Tobacco Use: High Risk (11/28/2024)   Patient History    Smoking Tobacco Use: Every Day    Smokeless Tobacco Use: Never    Passive Exposure: Current  Financial Resource Strain: Low Risk  (03/09/2024)   Received from [facility]   Overall Financial Resource Strain (CARDIA)    Difficulty of Paying Living Expenses: Not hard at all  Food Insecurity: No Food Insecurity (11/16/2024)   Epic    Worried About Radiation Protection Practitioner of Food in the Last Year: Never true    Ran Out of Food in the Last Year: Never true  Transportation Needs: No Transportation Needs (11/16/2024)   Epic    Lack of Transportation (Medical): No    Lack of Transportation (Non-Medical): No  Physical Activity: Not on file  Stress: Not on file  Social Connections: Socially Isolated (11/16/2024)   Social Connection and Isolation Panel    Frequency of Communication with Friends and Family: More than three times a week    Frequency of Social Gatherings with Friends and Family: Once a week    Attends Religious Services: Never    Database Administrator or Organizations: No    Attends Banker Meetings: Never    Marital Status: Widowed  Intimate Partner Violence: Not At Risk (11/16/2024)   Epic    Fear of Current or Ex-Partner: No    Emotionally Abused:  No    Physically Abused: No    Sexually Abused: No  Depression (PHQ2-9): High Risk (07/12/2024)   Depression (PHQ2-9)    PHQ-2 Score: 12  Alcohol  Screen: Low Risk (12/22/2022)   Alcohol  Screen    Last Alcohol  Screening Score (AUDIT): 1  Housing: Low Risk (11/16/2024)   Epic    Unable to Pay for Housing in the Last Year: No    Number of Times Moved in the Last Year: 0    Homeless in the Last Year: No  Utilities: Not At Risk (11/16/2024)   Epic    Threatened with  loss of utilities: No  Health Literacy: Not on file       Objective:  There were no vitals taken for this visit. {Pulm Vitals (Optional):32837}  Physical Exam  Diagnostic Review:  {Labs (Optional):32838}     Assessment & Plan:   Assessment & Plan   No orders of the defined types were placed in this encounter.     No follow-ups on file.   Daily Doe, MD    [1]  Current Outpatient Medications:    acetaminophen  (TYLENOL ) 650 MG CR tablet, Take 2 tablets (1,300 mg total) by mouth every 8 (eight) hours as needed for pain. Home med., Disp: , Rfl:    albuterol  (VENTOLIN  HFA) 108 (90 Base) MCG/ACT inhaler, Inhale 2 puffs into the lungs every 4 (four) hours as needed for wheezing or shortness of breath., Disp: 18 g, Rfl: 11   budesonide -glycopyrrolate -formoterol  (BREZTRI ) 160-9-4.8 MCG/ACT AERO inhaler, Inhale 2 puffs into the lungs in the morning and at bedtime., Disp: , Rfl:    busPIRone  (BUSPAR ) 30 MG tablet, Take 1 tablet (30 mg total) by mouth 2 (two) times daily., Disp: 180 tablet, Rfl: 1   Cholecalciferol  (VITAMIN D -3) 25 MCG (1000 UT) CAPS, Take 1,000 Units by mouth daily., Disp: , Rfl:    cloNIDine  (CATAPRES ) 0.3 MG tablet, Take 1 tablet (0.3 mg total) by mouth 2 (two) times daily., Disp: 60 tablet, Rfl: 0   cyanocobalamin  (VITAMIN B12) 1000 MCG tablet, TAKE 1 TABLET BY MOUTH EVERY DAY *NEW PRESCRIPTION REQUEST*, Disp: 90 tablet, Rfl: 3   diltiazem  (CARDIZEM  CD) 180 MG 24 hr capsule, Take 180 mg by mouth  every morning., Disp: , Rfl:    escitalopram  (LEXAPRO ) 20 MG tablet, TAKE 1 TABLET (20 MG TOTAL) BY MOUTH EVERY MORNING., Disp: 90 tablet, Rfl: 1   fenofibrate  micronized (LOFIBRA) 134 MG capsule, TAKE 1 CAPSULE BY MOUTH EVERY DAY BEFORE BREAKFAST, Disp: 90 capsule, Rfl: 1   fluticasone  (FLONASE ) 50 MCG/ACT nasal spray, Place 1 spray into both nostrils daily as needed for allergies., Disp: , Rfl:    gabapentin  (NEURONTIN ) 800 MG tablet, TAKE 1 TABLET BY MOUTH THREE TIMES DAILY, Disp: 270 tablet, Rfl: 1   levothyroxine  (SYNTHROID ) 25 MCG tablet, TAKE 1 TABLET BY MOUTH ONCE DAILY, Disp: 90 tablet, Rfl: 1   lidocaine -prilocaine (EMLA) cream, Apply 1 Application topically 4 (four) times daily as needed (pain)., Disp: , Rfl:    methocarbamol  (ROBAXIN ) 500 MG tablet, Take 1 tablet (500 mg total) by mouth every 6 (six) hours as needed for muscle spasms., Disp: 120 tablet, Rfl: 0   minoxidil  (LONITEN ) 10 MG tablet, TAKE 1 TABLET BY MOUTH EVERY DAY, Disp: 90 tablet, Rfl: 1   naphazoline-glycerin  (CLEAR EYES REDNESS) 0.012-0.25 % SOLN, Place 1-2 drops into both eyes 4 (four) times daily as needed for eye irritation., Disp: , Rfl:    olmesartan  (BENICAR ) 40 MG tablet, TAKE 1 TABLET BY MOUTH EVERY DAY, Disp: 90 tablet, Rfl: 1   ondansetron  (ZOFRAN -ODT) 4 MG disintegrating tablet, Take 1 tablet (4 mg total) by mouth every 6 (six) hours., Disp: 30 tablet, Rfl: 0   oxyCODONE  (OXY IR/ROXICODONE ) 5 MG immediate release tablet, Take 1 tablet (5 mg total) by mouth every 6 (six) hours as needed for severe pain (pain score 7-10)., Disp: 20 tablet, Rfl: 0   OXYGEN , Inhale 3 L into the lungs See admin instructions. Inhale 3L of oxygen  throughout the day as needed and at night, Disp: , Rfl:    pantoprazole  (PROTONIX ) 40 MG  tablet, TAKE 1 TABLET BY MOUTH EVERY DAY IN THE MORNING, Disp: 90 tablet, Rfl: 1   pramipexole  (MIRAPEX ) 0.25 MG tablet, TAKE 1 TABLET BY MOUTH 3 TIMES DAILY, Disp: 270 tablet, Rfl: 1   pyridOXINE  (VITAMIN B6) 100 MG tablet, TAKE 1 TABLET BY MOUTH EVERY DAY *NEW PRESCRIPTION REQUEST*, Disp: 90 tablet, Rfl: 3 No current facility-administered medications for this visit.  Facility-Administered Medications Ordered in Other Visits:    acetaminophen  (TYLENOL ) tablet 1,000 mg, 1,000 mg, Oral, Once, Vicci, Camellia Glatter, MD   chlorhexidine  (HIBICLENS ) 4 % liquid 4 application, 60 mL, Topical, Once, Duffy, Wonda, PA-C   chlorhexidine  (HIBICLENS ) 4 % liquid 4 application, 60 mL, Topical, Once, Duffy, Wonda, PA-C   chlorhexidine  (PERIDEX ) 0.12 % solution 15 mL, 15 mL, Mouth/Throat, Once **OR** Oral care mouth rinse, 15 mL, Mouth Rinse, Once, Piscitello, Fairy POUR, MD

## 2024-11-30 ENCOUNTER — Telehealth: Payer: Self-pay

## 2024-11-30 ENCOUNTER — Ambulatory Visit (HOSPITAL_BASED_OUTPATIENT_CLINIC_OR_DEPARTMENT_OTHER): Admitting: Pulmonary Disease

## 2024-11-30 ENCOUNTER — Other Ambulatory Visit: Payer: Self-pay | Admitting: Family Medicine

## 2024-11-30 DIAGNOSIS — Z1231 Encounter for screening mammogram for malignant neoplasm of breast: Secondary | ICD-10-CM

## 2024-11-30 NOTE — Telephone Encounter (Signed)
 Please call the patient and let her know that her FL2 has been ready at the nurses station ready for pick up

## 2024-11-30 NOTE — Telephone Encounter (Signed)
 Pt.notified

## 2024-12-03 ENCOUNTER — Telehealth: Payer: Self-pay | Admitting: Physician Assistant

## 2024-12-03 ENCOUNTER — Telehealth: Payer: Self-pay | Admitting: Orthopedic Surgery

## 2024-12-03 ENCOUNTER — Telehealth: Admitting: Physician Assistant

## 2024-12-03 NOTE — Telephone Encounter (Signed)
 I called requested for the report to be read

## 2024-12-03 NOTE — Telephone Encounter (Signed)
 I sent her a MyChart message about her xrays. Can we please call reading room to see if we can get them read (from 12/17)?

## 2024-12-03 NOTE — Telephone Encounter (Signed)
 There is another phone note/mychart Stacy sent to the patient.

## 2024-12-03 NOTE — Telephone Encounter (Signed)
 I called patient to reschedule her appointment for today due to [facility] being out of the office. I offered her 12/18/2024 and she did not want to wait that long. She states that she is having numbness in her face. Please advise.

## 2024-12-03 NOTE — Telephone Encounter (Signed)
 Cervical xrays dated 11/28/24:  FINDINGS:   BONES: Vertebral body heights are maintained. No fracture. No facet subluxation. Stable mild 3 mm anterolisthesis at C3-C4. Nonvisualization of C6-C7 and C7-T1 on the lateral view. Status post ACDF at C3-C4 and C6-C7 without periventricular fracture or loosening.   DISCS AND DEGENERATIVE CHANGES: Moderate degenerative disc disease at C5-C6. Moderate bilateral cervical facet arthropathy.   SOFT TISSUES: No prevertebral soft tissue swelling. The visualized lungs appear clear.   IMPRESSION: 1. Nonvisualization of C6-7 and C7-T1 on the lateral view. 2. Status post ACDF at C3-4 and C6-C7 without hardware complication. 3. Stable mild 3 mm anterolisthesis at C3-4. 4. Moderate degenerative disc disease at C5-6. 5. Moderate bilateral cervical facet arthropathy.   Electronically signed by: Selinda Blue MD 12/03/2024 12:09 PM EST RP Workstation: [id number]  I have personally reviewed the images and agree with the above interpretation.

## 2024-12-03 NOTE — Telephone Encounter (Signed)
 I sent her a MyChart visit earlier today and she has not read it.   Please call and let her know that  I have reviewed her cervical xrays from 11/28/24 and I don't see anything concerning. Radiologist did not see anything concerning as well.   How is her pain? Is it still left sided neck into her jaw?    Any new pain or weakness in her arms?   Has she seen [facility] yet?   Please find out and send message to both Herrick and I.

## 2024-12-04 ENCOUNTER — Ambulatory Visit: Payer: Self-pay | Admitting: Physician Assistant

## 2024-12-04 ENCOUNTER — Other Ambulatory Visit: Payer: Self-pay | Admitting: Physician Assistant

## 2024-12-04 DIAGNOSIS — M542 Cervicalgia: Secondary | ICD-10-CM

## 2024-12-04 DIAGNOSIS — Z981 Arthrodesis status: Secondary | ICD-10-CM

## 2024-12-04 DIAGNOSIS — M4312 Spondylolisthesis, cervical region: Secondary | ICD-10-CM

## 2024-12-04 NOTE — Telephone Encounter (Signed)
 Called patient to make her aware of MRI order. No answer left a voicemail.

## 2024-12-08 ENCOUNTER — Ambulatory Visit
Admission: RE | Admit: 2024-12-08 | Discharge: 2024-12-08 | Disposition: A | Source: Ambulatory Visit | Attending: Physician Assistant | Admitting: Physician Assistant

## 2024-12-08 DIAGNOSIS — M4312 Spondylolisthesis, cervical region: Secondary | ICD-10-CM

## 2024-12-08 DIAGNOSIS — M542 Cervicalgia: Secondary | ICD-10-CM

## 2024-12-08 DIAGNOSIS — Z981 Arthrodesis status: Secondary | ICD-10-CM

## 2024-12-10 ENCOUNTER — Encounter

## 2024-12-10 NOTE — Telephone Encounter (Signed)
 Per chart MRI was done

## 2024-12-18 ENCOUNTER — Telehealth: Payer: Self-pay | Admitting: Physician Assistant

## 2024-12-18 NOTE — Telephone Encounter (Signed)
 Aloha from [facility] left a voicemail for our office to contact the reading room regarding the patient's imaging results.   [phone]

## 2024-12-18 NOTE — Telephone Encounter (Signed)
 I called [name] and advised that we have results of her MRI cervical in the chart and they wanted to make sure [name] was aware. IMPRESSION: 1. Postoperative changes from interval ACDF at C3-4 and C6-7. Residual severe spinal stenosis at C3-4 with hazy cord signal changes, which could reflect edema and/or myelomalacia. 2. Multifactorial degenerative changes at C4-5 with resultant moderate to severe spinal stenosis, with mild left C5 foraminal narrowing. 3. Right eccentric disc osteophyte complex at C5-6 with resultant moderate canal and severe right C6 foraminal stenosis. 4. Reactive marrow edema about the C2-3 and C3-4 facets bilaterally due to facet arthritis, worse on the left. Finding could serve as a source for neck pain.

## 2024-12-20 ENCOUNTER — Ambulatory Visit (INDEPENDENT_AMBULATORY_CARE_PROVIDER_SITE_OTHER): Admitting: Neurosurgery

## 2024-12-20 ENCOUNTER — Encounter: Payer: Self-pay | Admitting: Neurosurgery

## 2024-12-20 VITALS — BP 126/72 | Ht 66.0 in | Wt 149.1 lb

## 2024-12-20 DIAGNOSIS — M25512 Pain in left shoulder: Secondary | ICD-10-CM

## 2024-12-20 DIAGNOSIS — G8929 Other chronic pain: Secondary | ICD-10-CM | POA: Diagnosis not present

## 2024-12-20 DIAGNOSIS — M4802 Spinal stenosis, cervical region: Secondary | ICD-10-CM | POA: Diagnosis not present

## 2024-12-20 DIAGNOSIS — G9589 Other specified diseases of spinal cord: Secondary | ICD-10-CM

## 2024-12-20 DIAGNOSIS — G959 Disease of spinal cord, unspecified: Secondary | ICD-10-CM

## 2024-12-20 NOTE — Progress Notes (Signed)
" ° °  REFERRING PHYSICIAN:  Stephanie Charlene CROME, Md [address] [place],  [place] [address]  DOS: 08/17/24, C3-4 and C6-7 ACDF  HISTORY OF PRESENT ILLNESS: [name] is status post C3-4 and C6-7 ACDF.   She continues to have some symptoms of chronic myelopathy.  However, her biggest issue is pain around her left shoulder.  She has pain in her anterior shoulder when she rotates her arm.     PHYSICAL EXAMINATION:  NEUROLOGICAL:  General: In no acute distress.   Awake, alert, oriented to person, place, and time.  Pupils equal round and reactive to light.  Facial tone is symmetric.    Strength: Side Biceps Triceps Deltoid Interossei Grip Wrist Ext. Wrist Flex.  R 5 5 4+ 5 5 5 5   L 5 4+ 4 5 5 5 5    Incision c/d/I  MSK: TTP over left anterior shoulder.   Imaging:  09/25/24 cervical xrays Without evidence of hardware malfunction  Assessment / Plan: [name] is doing better after C3-4 and C6-7 ACDF on 08/17/2024.   She does still have some chronic myelopathy.  Her MRI scan does show some persistent stenosis.  However, her symptoms are more consistent with left shoulder pathology.  Her x-ray was unrevealing.  I will send her for left shoulder MRI.   I spent a total of 10 minutes in this patient's care today. This time was spent reviewing pertinent records including imaging studies, obtaining and confirming history, performing a directed evaluation, formulating and discussing my recommendations, and documenting the visit within the medical record.   [name] MD Dept of Neurosurgery   "

## 2024-12-23 ENCOUNTER — Other Ambulatory Visit: Payer: Self-pay

## 2024-12-23 ENCOUNTER — Encounter: Payer: Self-pay | Admitting: Emergency Medicine

## 2024-12-23 ENCOUNTER — Observation Stay: Admission: EM | Admit: 2024-12-23 | Discharge: 2024-12-23 | Disposition: A | Discharge status: Home / Self Care

## 2024-12-23 ENCOUNTER — Emergency Department

## 2024-12-23 DIAGNOSIS — N1832 Chronic kidney disease, stage 3b: Secondary | ICD-10-CM | POA: Diagnosis not present

## 2024-12-23 DIAGNOSIS — I5033 Acute on chronic diastolic (congestive) heart failure: Secondary | ICD-10-CM | POA: Insufficient documentation

## 2024-12-23 DIAGNOSIS — I509 Heart failure, unspecified: Principal | ICD-10-CM

## 2024-12-23 DIAGNOSIS — F32A Depression, unspecified: Secondary | ICD-10-CM

## 2024-12-23 DIAGNOSIS — I130 Hypertensive heart and chronic kidney disease with heart failure and stage 1 through stage 4 chronic kidney disease, or unspecified chronic kidney disease: Secondary | ICD-10-CM | POA: Insufficient documentation

## 2024-12-23 DIAGNOSIS — Z79890 Hormone replacement therapy (postmenopausal): Secondary | ICD-10-CM | POA: Diagnosis not present

## 2024-12-23 DIAGNOSIS — I10 Essential (primary) hypertension: Secondary | ICD-10-CM

## 2024-12-23 DIAGNOSIS — J9621 Acute and chronic respiratory failure with hypoxia: Secondary | ICD-10-CM | POA: Diagnosis not present

## 2024-12-23 DIAGNOSIS — I2510 Atherosclerotic heart disease of native coronary artery without angina pectoris: Secondary | ICD-10-CM | POA: Insufficient documentation

## 2024-12-23 DIAGNOSIS — F17210 Nicotine dependence, cigarettes, uncomplicated: Secondary | ICD-10-CM | POA: Insufficient documentation

## 2024-12-23 DIAGNOSIS — Z79899 Other long term (current) drug therapy: Secondary | ICD-10-CM | POA: Insufficient documentation

## 2024-12-23 DIAGNOSIS — M47816 Spondylosis without myelopathy or radiculopathy, lumbar region: Secondary | ICD-10-CM

## 2024-12-23 DIAGNOSIS — E1122 Type 2 diabetes mellitus with diabetic chronic kidney disease: Secondary | ICD-10-CM | POA: Insufficient documentation

## 2024-12-23 DIAGNOSIS — F3289 Other specified depressive episodes: Secondary | ICD-10-CM

## 2024-12-23 DIAGNOSIS — K219 Gastro-esophageal reflux disease without esophagitis: Secondary | ICD-10-CM

## 2024-12-23 DIAGNOSIS — R0602 Shortness of breath: Secondary | ICD-10-CM | POA: Diagnosis present

## 2024-12-23 DIAGNOSIS — Z96653 Presence of artificial knee joint, bilateral: Secondary | ICD-10-CM | POA: Insufficient documentation

## 2024-12-23 DIAGNOSIS — F418 Other specified anxiety disorders: Secondary | ICD-10-CM | POA: Diagnosis not present

## 2024-12-23 DIAGNOSIS — J301 Allergic rhinitis due to pollen: Secondary | ICD-10-CM

## 2024-12-23 DIAGNOSIS — E782 Mixed hyperlipidemia: Secondary | ICD-10-CM

## 2024-12-23 DIAGNOSIS — E039 Hypothyroidism, unspecified: Secondary | ICD-10-CM | POA: Insufficient documentation

## 2024-12-23 DIAGNOSIS — G2581 Restless legs syndrome: Secondary | ICD-10-CM

## 2024-12-23 DIAGNOSIS — J441 Chronic obstructive pulmonary disease with (acute) exacerbation: Secondary | ICD-10-CM | POA: Diagnosis not present

## 2024-12-23 LAB — CBC
HCT: 34.5 % — ABNORMAL LOW (ref 36.0–46.0)
Hemoglobin: 11.5 g/dL — ABNORMAL LOW (ref 12.0–15.0)
MCH: 31.1 pg (ref 26.0–34.0)
MCHC: 33.3 g/dL (ref 30.0–36.0)
MCV: 93.2 fL (ref 80.0–100.0)
Platelets: 266 K/uL (ref 150–400)
RBC: 3.7 MIL/uL — ABNORMAL LOW (ref 3.87–5.11)
RDW: 15.6 % — ABNORMAL HIGH (ref 11.5–15.5)
WBC: 10.7 K/uL — ABNORMAL HIGH (ref 4.0–10.5)
nRBC: 0 % (ref 0.0–0.2)

## 2024-12-23 LAB — BASIC METABOLIC PANEL WITH GFR
Anion gap: 8 (ref 5–15)
BUN: 13 mg/dL (ref 8–23)
CO2: 28 mmol/L (ref 22–32)
Calcium: 9.8 mg/dL (ref 8.9–10.3)
Chloride: 99 mmol/L (ref 98–111)
Creatinine, Ser: 0.83 mg/dL (ref 0.44–1.00)
GFR, Estimated: >60 mL/min
Glucose, Bld: 162 mg/dL — ABNORMAL HIGH (ref 70–99)
Potassium: 3.8 mmol/L (ref 3.5–5.1)
Sodium: 134 mmol/L — ABNORMAL LOW (ref 135–145)

## 2024-12-23 LAB — RESP PANEL BY RT-PCR (RSV, FLU A&B, COVID)  RVPGX2
Influenza A by PCR: NEGATIVE
Influenza B by PCR: NEGATIVE
Resp Syncytial Virus by PCR: NEGATIVE
SARS Coronavirus 2 by RT PCR: NEGATIVE

## 2024-12-23 LAB — TROPONIN T, HIGH SENSITIVITY: Troponin T High Sensitivity: 29 ng/L — ABNORMAL HIGH (ref 0–19)

## 2024-12-23 LAB — PRO BRAIN NATRIURETIC PEPTIDE: Pro Brain Natriuretic Peptide: 1435 pg/mL — ABNORMAL HIGH

## 2024-12-23 LAB — MAGNESIUM: Magnesium: 1.8 mg/dL (ref 1.7–2.4)

## 2024-12-23 MED ORDER — IPRATROPIUM-ALBUTEROL 0.5-2.5 (3) MG/3ML IN SOLN
3.0000 mL | Freq: Once | RESPIRATORY_TRACT | Status: AC
  Administered 2024-12-23: 3 mL via RESPIRATORY_TRACT
  Filled 2024-12-23: qty 3

## 2024-12-23 MED ORDER — PRAMIPEXOLE DIHYDROCHLORIDE 0.25 MG PO TABS
0.2500 mg | ORAL_TABLET | Freq: Three times a day (TID) | ORAL | Status: DC
  Filled 2024-12-23: qty 1

## 2024-12-23 MED ORDER — PREDNISONE 10 MG PO TABS: 40.0000 mg | ORAL_TABLET | Freq: Every day | ORAL | 0 refills | Status: AC

## 2024-12-23 MED ORDER — BUDESON-GLYCOPYRROL-FORMOTEROL 160-9-4.8 MCG/ACT IN AERO
2.0000 | INHALATION_SPRAY | Freq: Two times a day (BID) | RESPIRATORY_TRACT | Status: DC
  Filled 2024-12-23: qty 5.9

## 2024-12-23 MED ORDER — ONDANSETRON HCL 4 MG PO TABS: 4.0000 mg | ORAL_TABLET | Freq: Four times a day (QID) | ORAL | Status: DC | PRN

## 2024-12-23 MED ORDER — BUSPIRONE HCL 5 MG PO TABS: 30.0000 mg | ORAL_TABLET | Freq: Two times a day (BID) | ORAL | Status: DC

## 2024-12-23 MED ORDER — METHYLPREDNISOLONE SODIUM SUCC 125 MG IJ SOLR
80.0000 mg | Freq: Once | INTRAMUSCULAR | Status: AC
  Administered 2024-12-23: 80 mg via INTRAVENOUS
  Filled 2024-12-23: qty 2

## 2024-12-23 MED ORDER — FENOFIBRATE 160 MG PO TABS: 160.0000 mg | ORAL_TABLET | Freq: Every day | ORAL | Status: DC

## 2024-12-23 MED ORDER — METHYLPREDNISOLONE SODIUM SUCC 40 MG IJ SOLR: 40.0000 mg | Freq: Two times a day (BID) | INTRAMUSCULAR | Status: DC

## 2024-12-23 MED ORDER — ACETAMINOPHEN 325 MG PO TABS: 650.0000 mg | ORAL_TABLET | Freq: Four times a day (QID) | ORAL | Status: DC | PRN

## 2024-12-23 MED ORDER — TRAZODONE HCL 50 MG PO TABS: 25.0000 mg | ORAL_TABLET | Freq: Every evening | ORAL | Status: DC | PRN

## 2024-12-23 MED ORDER — ALBUTEROL SULFATE (2.5 MG/3ML) 0.083% IN NEBU: 3.0000 mL | INHALATION_SOLUTION | RESPIRATORY_TRACT | Status: DC | PRN

## 2024-12-23 MED ORDER — KETOROLAC TROMETHAMINE 15 MG/ML IJ SOLN
15.0000 mg | Freq: Once | INTRAMUSCULAR | Status: AC
  Administered 2024-12-23: 15 mg via INTRAVENOUS
  Filled 2024-12-23: qty 1

## 2024-12-23 MED ORDER — IPRATROPIUM-ALBUTEROL 0.5-2.5 (3) MG/3ML IN SOLN
3.0000 mL | Freq: Four times a day (QID) | RESPIRATORY_TRACT | Status: DC
  Filled 2024-12-23: qty 3

## 2024-12-23 MED ORDER — BACITRACIN-NEOMYCIN-POLYMYXIN OINTMENT TUBE
TOPICAL_OINTMENT | Freq: Three times a day (TID) | CUTANEOUS | Status: DC
  Filled 2024-12-23: qty 14.17

## 2024-12-23 MED ORDER — IRBESARTAN 150 MG PO TABS: 300.0000 mg | ORAL_TABLET | Freq: Every day | ORAL | Status: DC

## 2024-12-23 MED ORDER — AMOXICILLIN-POT CLAVULANATE 500-125 MG PO TABS
1.0000 | ORAL_TABLET | Freq: Two times a day (BID) | ORAL | Status: DC
  Administered 2024-12-23: 1 via ORAL
  Filled 2024-12-23: qty 1

## 2024-12-23 MED ORDER — SODIUM CHLORIDE 0.9 % IV BOLUS: 500.0000 mL | Freq: Once | INTRAVENOUS | Status: DC

## 2024-12-23 MED ORDER — FUROSEMIDE 10 MG/ML IJ SOLN
40.0000 mg | Freq: Once | INTRAMUSCULAR | Status: AC
  Administered 2024-12-23: 40 mg via INTRAVENOUS
  Filled 2024-12-23: qty 4

## 2024-12-23 MED ORDER — METHOCARBAMOL 500 MG PO TABS: 500.0000 mg | ORAL_TABLET | Freq: Four times a day (QID) | ORAL | Status: DC | PRN

## 2024-12-23 MED ORDER — PREDNISONE 10 MG PO TABS: 40.0000 mg | ORAL_TABLET | Freq: Every day | ORAL | 0 refills | Status: DC

## 2024-12-23 MED ORDER — OXYCODONE HCL 5 MG PO TABS
5.0000 mg | ORAL_TABLET | Freq: Four times a day (QID) | ORAL | Status: DC | PRN
  Administered 2024-12-23: 5 mg via ORAL
  Filled 2024-12-23: qty 1

## 2024-12-23 MED ORDER — FLUTICASONE PROPIONATE 50 MCG/ACT NA SUSP: 1.0000 | Freq: Every day | NASAL | Status: DC | PRN

## 2024-12-23 MED ORDER — ONDANSETRON HCL 4 MG/2ML IJ SOLN: 4.0000 mg | Freq: Four times a day (QID) | INTRAMUSCULAR | Status: DC | PRN

## 2024-12-23 MED ORDER — LEVOTHYROXINE SODIUM 25 MCG PO TABS: 25.0000 ug | ORAL_TABLET | Freq: Every day | ORAL | Status: DC

## 2024-12-23 MED ORDER — ESCITALOPRAM OXALATE 10 MG PO TABS: 20.0000 mg | ORAL_TABLET | ORAL | Status: DC

## 2024-12-23 MED ORDER — SENNOSIDES-DOCUSATE SODIUM 8.6-50 MG PO TABS: 1.0000 | ORAL_TABLET | Freq: Every evening | ORAL | Status: DC | PRN

## 2024-12-23 MED ORDER — CLONIDINE HCL 0.1 MG PO TABS: 0.3000 mg | ORAL_TABLET | Freq: Two times a day (BID) | ORAL | Status: DC

## 2024-12-23 MED ORDER — ENOXAPARIN SODIUM 40 MG/0.4ML IJ SOSY: 40.0000 mg | PREFILLED_SYRINGE | INTRAMUSCULAR | Status: DC

## 2024-12-23 MED ORDER — MINOXIDIL 10 MG PO TABS: 10.0000 mg | ORAL_TABLET | Freq: Every day | ORAL | Status: DC

## 2024-12-23 MED ORDER — PREDNISONE 20 MG PO TABS: 40.0000 mg | ORAL_TABLET | Freq: Every day | ORAL | Status: DC

## 2024-12-23 MED ORDER — ACETAMINOPHEN 650 MG RE SUPP: 650.0000 mg | Freq: Four times a day (QID) | RECTAL | Status: DC | PRN

## 2024-12-23 MED ORDER — DILTIAZEM HCL ER COATED BEADS 180 MG PO CP24: 180.0000 mg | ORAL_CAPSULE | ORAL | Status: DC

## 2024-12-23 MED ORDER — GABAPENTIN 400 MG PO CAPS
800.0000 mg | ORAL_CAPSULE | Freq: Three times a day (TID) | ORAL | Status: DC
  Filled 2024-12-23: qty 2

## 2024-12-23 MED ORDER — PANTOPRAZOLE SODIUM 40 MG PO TBEC: 40.0000 mg | DELAYED_RELEASE_TABLET | Freq: Every day | ORAL | Status: DC

## 2024-12-23 NOTE — ED Provider Notes (Signed)
 "  [facility] Provider Note    Event Date/Time   First MD Initiated Contact with Patient 12/23/24 1039     (approximate)   History   Shortness of Breath   HPI  [name] is a 69 y.o. female presenting today with concern of lower extremity discomfort, shortness of breath and increased fatigue.  Ongoing for the last 3 days.  Recently she took her neighbor to urgent care who was found to have the flu.  Patient denies nausea vomiting, she states both of her knees have been irritating her she had knee replacements many years ago, feels like both legs are also swollen now.  Now also having some increased shortness of breath.  She tells me that she normally wears at night for her underlying emphysema but otherwise generally does not need oxygen .  She does have a history of CHF she generally does not use a diuretic.     Physical Exam   Triage Vital Signs: ED Triage Vitals  Encounter Vitals Group     BP 12/23/24 1022 121/67     Girls Systolic BP Percentile --      Girls Diastolic BP Percentile --      Boys Systolic BP Percentile --      Boys Diastolic BP Percentile --      Pulse Rate 12/23/24 1022 69     Resp 12/23/24 1022 20     Temp 12/23/24 1022 98 F (36.7 C)     Temp src --      SpO2 12/23/24 1022 93 %     Weight 12/23/24 1018 151 lb (68.5 kg)     Height 12/23/24 1018 5' 5 (1.651 m)     Head Circumference --      Peak Flow --      Pain Score 12/23/24 1017 10     Pain Loc --      Pain Education --      Exclude from Growth Chart --     Most recent vital signs: Vitals:   12/23/24 1022  BP: 121/67  Pulse: 69  Resp: 20  Temp: 98 F (36.7 C)  SpO2: 93%     General: Awake, no distress.  CV:  Good peripheral perfusion.  Trace swelling in the bilateral lower extremities Resp:  Normal effort.  Able to speak in full sentences, faint wheezes and rales appreciated to lower lobes Abd:  No distention.  Soft nontender Other:     ED Results /  Procedures / Treatments   Labs (all labs ordered are listed, but only abnormal results are displayed) Labs Reviewed  BASIC METABOLIC PANEL WITH GFR - Abnormal; Notable for the following components:      Result Value   Sodium 134 (*)    Glucose, Bld 162 (*)    All other components within normal limits  CBC - Abnormal; Notable for the following components:   WBC 10.7 (*)    RBC 3.70 (*)    Hemoglobin 11.5 (*)    HCT 34.5 (*)    RDW 15.6 (*)    All other components within normal limits  PRO BRAIN NATRIURETIC PEPTIDE - Abnormal; Notable for the following components:   Pro Brain Natriuretic Peptide 1,435.0 (*)    All other components within normal limits  TROPONIN T, HIGH SENSITIVITY - Abnormal; Notable for the following components:   Troponin T High Sensitivity 29 (*)    All other components within normal limits  RESP PANEL BY  RT-PCR (RSV, FLU A&B, COVID)  RVPGX2  RESPIRATORY PANEL BY PCR  EXPECTORATED SPUTUM ASSESSMENT W GRAM STAIN, RFLX TO RESP C  MAGNESIUM   TROPONIN T, HIGH SENSITIVITY     EKG  On my independent interpretation of this EKG appears to be a sinus rhythm with rate of about 70, axis of about 40, intervals appear to be within normal limits, no obvious ischemia and I appreciate on this EKG, there are occasional PVCs   RADIOLOGY My independent interpretation of this chest x-ray, no acute cardiopulmonary findings  PROCEDURES:  Critical Care performed: No  Procedures   MEDICATIONS ORDERED IN ED: Medications  amoxicillin -clavulanate (AUGMENTIN ) 500-125 MG per tablet 1 tablet (has no administration in time range)  cloNIDine  (CATAPRES ) tablet 0.3 mg (has no administration in time range)  diltiazem  (CARDIZEM  CD) 24 hr capsule 180 mg (has no administration in time range)  fenofibrate  tablet 160 mg (has no administration in time range)  minoxidil  (LONITEN ) tablet 10 mg (has no administration in time range)  irbesartan  (AVAPRO ) tablet 300 mg (has no administration  in time range)  busPIRone  (BUSPAR ) tablet 30 mg (has no administration in time range)  escitalopram  (LEXAPRO ) tablet 20 mg (has no administration in time range)  levothyroxine  (SYNTHROID ) tablet 25 mcg (has no administration in time range)  pantoprazole  (PROTONIX ) EC tablet 40 mg (has no administration in time range)  gabapentin  (NEURONTIN ) tablet 800 mg (has no administration in time range)  methocarbamol  (ROBAXIN ) tablet 500 mg (has no administration in time range)  pramipexole  (MIRAPEX ) tablet 0.25 mg (has no administration in time range)  albuterol  (PROVENTIL ) (2.5 MG/3ML) 0.083% nebulizer solution 3 mL (has no administration in time range)  budesonide -glycopyrrolate -formoterol  (BREZTRI ) 160-9-4.8 MCG/ACT inhaler 2 puff (has no administration in time range)  fluticasone  (FLONASE ) 50 MCG/ACT nasal spray 1 spray (has no administration in time range)  neomycin -bacitracin -polymyxin (NEOSPORIN) ointment (has no administration in time range)  enoxaparin  (LOVENOX ) injection 40 mg (has no administration in time range)  methylPREDNISolone  sodium succinate (SOLU-MEDROL ) 40 mg/mL injection 40 mg (has no administration in time range)    Followed by  predniSONE  (DELTASONE ) tablet 40 mg (has no administration in time range)  ipratropium-albuterol  (DUONEB) 0.5-2.5 (3) MG/3ML nebulizer solution 3 mL (has no administration in time range)  acetaminophen  (TYLENOL ) tablet 650 mg (has no administration in time range)    Or  acetaminophen  (TYLENOL ) suppository 650 mg (has no administration in time range)  ondansetron  (ZOFRAN ) tablet 4 mg (has no administration in time range)    Or  ondansetron  (ZOFRAN ) injection 4 mg (has no administration in time range)  traZODone  (DESYREL ) tablet 25 mg (has no administration in time range)  senna-docusate (Senokot-S) tablet 1 tablet (has no administration in time range)  ketorolac  (TORADOL ) 15 MG/ML injection 15 mg (15 mg Intravenous Given 12/23/24 1158)   ipratropium-albuterol  (DUONEB) 0.5-2.5 (3) MG/3ML nebulizer solution 3 mL (3 mLs Nebulization Given 12/23/24 1152)  furosemide  (LASIX ) injection 40 mg (40 mg Intravenous Given 12/23/24 1158)  methylPREDNISolone  sodium succinate (SOLU-MEDROL ) 125 mg/2 mL injection 80 mg (80 mg Intravenous Given 12/23/24 1157)     IMPRESSION / MDM / ASSESSMENT AND PLAN / ED COURSE  I reviewed the triage vital signs and the nursing notes.                               Patient's presentation is most consistent with acute complicated illness / injury requiring diagnostic workup.  69 year old female history  of CHF COPD CKD and chronic pain who presents today with concern of increased shortness of breath.  On 3 L of nasal cannula today for appropriate oxygen  saturations.  Clinically appears mildly fluid overloaded and chest x-ray on my independent interpretation does not appear to demonstrate significant fluid retention.  Recent contact with neighbor who was positive for the flu.  Given the presentation and the exam, I feel unlikely ACS unlikely PE, considering possibly COPD exacerbation or CHF exacerbation, possibly viral etiology.  Will follow-up labs images and viral panel testing will attempt symptomatic management and determine disposition accordingly.   Clinical Course as of 12/23/24 1238  Sun Dec 23, 2024  1122 Elevated BNP here, unfortunately given her increased oxygen  requirement and her shortness of breath with exertion, suspect likely would benefit from continued diuresis inpatient.  Will reach out to medicine team for admission at this time. [SK]  1139 Spoke with Dr. Laurita who has agreed to evaluate the patient to determine course of further medical management. [SK]    Clinical Course User Index [SK] Fernand Rossie HERO, MD     FINAL CLINICAL IMPRESSION(S) / ED DIAGNOSES   Final diagnoses:  Acute on chronic congestive heart failure, unspecified heart failure type (HCC)  COPD exacerbation (HCC)      Rx / DC Orders   ED Discharge Orders     None        Note:  This document was prepared using Dragon voice recognition software and may include unintentional dictation errors.   Fernand Rossie HERO, MD 12/23/24 1238  "

## 2024-12-23 NOTE — H&P (Signed)
 " History and Physical    [name] FMW:[id number] DOB: [birth year] DOA: 12/23/2024  PCP: Stephanie Charlene CROME, MD (Confirm with patient/family/NH records and if not entered, this has to be entered at [facility] point of entry) Patient coming from: Home  I have personally briefly reviewed patient's old medical records in [facility]  Chief Complaint: Cough, wheezing, SOB  HPI: [name] is a 69 y.o. female with medical history significant of COPD Gold stage II, chronic hypoxic respiratory failure on 3 L admitted, chronic back pain, status post C3-C4 and C6/C7 ACDF September 2025, HTN, chronic HFpEF, hypothyroidism, GERD, IIDM, anxiety/depression, cigarette smoking, presented with worsening of cough wheezing shortness of breath.  Symptoms started 3 days ago when patient started to have productive cough with thick yellowish sputum and increasing wheezing and congestion.  Her pulmonologist prescribed her with Augmentin .  She denies any chest pain no fever or chills.  Incidental using as needed oxygen  started to wear oxygen  around-the-clock however her breathing symptoms not improving and she decided to come to the hospital.  She denied any increasing ankle edema.  She fell 4 days ago and sustained several skin abrasions on the right neck.  But denied LOC no neck or head injury. ED Course: Afebrile, no tachycardia blood pressure 121/67 O2 saturation 93% on 3 L.  Patient became very wheezy and winded with short distance walk.  Chest x-ray negative for acute infiltrates, blood work showed WBC 10.7 hemoglobin 11.5 BUN 14 creatinine 0.8 bicarb 28.  Patient was given DuoNebs Solu-Medrol  IV Lasix  x 1 in the ED.  Review of Systems: As per HPI otherwise 14 point review of systems negative.    Past Medical History:  Diagnosis Date   (HFpEF) heart failure with preserved ejection fraction (HCC)    Anxiety    Aortic atherosclerosis    Arthritis    Bilateral hand numbness    CAD (coronary artery disease)     Cervical myelopathy (HCC)    Cervical spinal stenosis    Chest pain at rest    CHF (congestive heart failure) (HCC)    Chronic pain syndrome    Chronic respiratory failure with hypoxia and hypercapnia (HCC)    CKD stage 3b, GFR 30-44 ml/min (HCC)    Community acquired pneumonia 01/10/2013   COPD (chronic obstructive pulmonary disease) (HCC)    DDD (degenerative disc disease), thoracic    Dependence on supplemental oxygen  (3L/[place] qhs PRN)    Depression    DM (diabetes mellitus), type 2 (HCC)    Dyspnea    Failed back surgical syndrome    GERD (gastroesophageal reflux disease)    Hyperlipidemia    Hypertension    Hypothyroid    Lumbar spondylosis    a.) s/p lumbar laminectomy, microdiscectomy, and L4-L5 fusion   Neuropathy    Nicotine  dependence    Normocytic anemia    OSA on CPAP    Pancreatitis 03/2024   Peripheral vascular disease    Pulmonary hypertension (HCC)    Recurrent UTI (urinary tract infection)    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  no seizure since--pt does take keppra  and office note from neurologist dr. margaret on this chart  Sepsis due to pneumonia [facility])    Sinus tachycardia    Tobacco abuse    Urinary incontinence    Wound of left leg    being seen at wound clinic as of 03-23-24    Past Surgical History:  Procedure Laterality Date   ANTERIOR CERVICAL DECOMP/DISCECTOMY FUSION N/A 08/17/2024   Procedure: ANTERIOR CERVICAL DECOMPRESSION/DISCECTOMY FUSION 2 LEVELS;  Surgeon: Clois Fret, MD;  Location: [facility] ORS;  Service: Neurosurgery;  Laterality: N/A;  C3-4 & C6-7 ANTERIOR CERVICAL DISCECTOMY AND FUSION   CARPAL TUNNEL RELEASE Left    COLONOSCOPY WITH PROPOFOL  N/A 06/17/2022    Procedure: COLONOSCOPY WITH PROPOFOL ;  Surgeon: Maryruth Ole DASEN, MD;  Location: [facility] ENDOSCOPY;  Service: Endoscopy;  Laterality: N/A;   JOINT REPLACEMENT  08/26/2011   right total knee arthroplasty, left total knee   KNEE ARTHROPLASTY  10/28/2011   Procedure: COMPUTER ASSISTED TOTAL KNEE ARTHROPLASTY;  Surgeon: Norleen CROME Rendall III;  Location: [facility] OR[facility];  Service: Orthopedics;  Laterality: Left;  preop femoral nerve block   LAMINECTOMY AND MICRODISCECTOMY LUMBAR SPINE N/A    Procedure: LUMBAR LAMINECTOMY, MICRODISCECTOMY, AND L4-L5 FUSION   OPEN REDUCTION INTERNAL FIXATION (ORIF) METACARPAL Left 01/30/2024   Procedure: OPEN REDUCTION INTERNAL FIXATION (ORIF) OF LEFT 5TH METACARPAL SHAFT FRACTURE.;  Surgeon: Edie Norleen PARAS, MD;  Location: [facility] ORS;  Service: Orthopedics;  Laterality: Left;   PROCTOSCOPY N/A 06/18/2022   Procedure: RIGID PROCTOSCOPY;  Surgeon: Sheldon Standing, MD;  Location: [facility] ORS;  Service: General;  Laterality: N/A;     reports that she has been smoking cigarettes. She has a 100 pack-year smoking history. She has been exposed to tobacco smoke. She has never used smokeless tobacco. She reports current alcohol  use. She reports that she does not use drugs.  Allergies[1]  Family History  Problem Relation Age of Onset   Heart attack Father 38   Alzheimer's disease Mother    Breast cancer Neg Hx     Prior to Admission medications  Medication Sig Start Date End Date Taking? Authorizing Provider  acetaminophen  (TYLENOL ) 650 MG CR tablet Take 2 tablets (1,300 mg total) by mouth every 8 (eight) hours as needed for pain. Home med. 07/28/24   Awanda City, MD  albuterol  (VENTOLIN  HFA) 108 [phone] Base) MCG/ACT inhaler Inhale 2 puffs into the lungs every 4 (four) hours as needed for wheezing or shortness of breath. 11/29/22   Bernardo Fend, DO  amoxicillin -clavulanate (AUGMENTIN ) 500-125 MG tablet Take 1 tablet by mouth 2 (two) times daily. for 7 days 12/20/24 12/27/24  [provider]  budesonide -glycopyrrolate -formoterol  (BREZTRI ) 160-9-4.8 MCG/ACT AERO inhaler Inhale 2 puffs into the lungs in the morning and at bedtime. 07/04/24 07/04/25  [provider]  busPIRone  (BUSPAR ) 30 MG tablet Take 1 tablet (30 mg total) by mouth 2 (two) times daily. 08/07/24   Albina GORMAN Dine, MD  Cholecalciferol  (VITAMIN D -3) 25 MCG (1000 UT) CAPS Take 1,000 Units by mouth daily.    [provider]  cloNIDine  (CATAPRES ) 0.3 MG tablet Take 1 tablet (0.3 mg total) by mouth 2 (two) times daily. 10/05/24 12/20/24  Albina GORMAN Dine, MD  cyanocobalamin  (VITAMIN B12) 1000 MCG tablet TAKE 1 TABLET BY MOUTH EVERY DAY *NEW PRESCRIPTION REQUEST* 06/22/24   Albina GORMAN Dine, MD  diltiazem  (CARDIZEM  CD) 180 MG 24 hr capsule Take 180 mg by mouth every morning.    [provider]  escitalopram  (LEXAPRO ) 20 MG tablet TAKE 1 TABLET (20 MG TOTAL) BY MOUTH EVERY MORNING. 11/12/24  Albina GORMAN Dine, MD  fenofibrate  micronized (LOFIBRA) 134 MG capsule TAKE 1 CAPSULE BY MOUTH EVERY DAY BEFORE BREAKFAST 11/16/24   Albina GORMAN Dine, MD  fluticasone  (FLONASE ) 50 MCG/ACT nasal spray Place 1 spray into both nostrils daily as needed for allergies. 05/17/24 12/20/24  Poggi, Norleen PARAS, MD  gabapentin  (NEURONTIN ) 800 MG tablet TAKE 1 TABLET BY MOUTH THREE TIMES DAILY 10/03/24   Tejan-Sie, S Ahmed, MD  levothyroxine  (SYNTHROID ) 25 MCG tablet TAKE 1 TABLET BY MOUTH ONCE DAILY 11/12/24   Tejan-Sie, S Ahmed, MD  lidocaine -prilocaine (EMLA) cream Apply 1 Application topically 4 (four) times daily as needed (pain). 07/20/24   [provider]  methocarbamol  (ROBAXIN ) 500 MG tablet Take 1 tablet (500 mg total) by mouth every 6 (six) hours as needed for muscle spasms. 08/29/24   Ulis Bottcher, PA-C  minoxidil  (LONITEN ) 10 MG tablet TAKE 1 TABLET BY MOUTH EVERY DAY 10/08/24   Albina GORMAN Dine, MD  naphazoline-glycerin  (CLEAR EYES REDNESS) 0.012-0.25 % SOLN Place 1-2 drops into both eyes 4 (four) times  daily as needed for eye irritation.    [provider]  olmesartan  (BENICAR ) 40 MG tablet TAKE 1 TABLET BY MOUTH EVERY DAY 11/05/24   Albina GORMAN Dine, MD  ondansetron  (ZOFRAN -ODT) 4 MG disintegrating tablet Take 1 tablet (4 mg total) by mouth every 6 (six) hours. 10/02/24   Albina GORMAN Dine, MD  OXYGEN  Inhale 3 L into the lungs See admin instructions. Inhale 3L of oxygen  throughout the day as needed and at night    [provider]  pantoprazole  (PROTONIX ) 40 MG tablet TAKE 1 TABLET BY MOUTH EVERY DAY IN THE MORNING 04/02/24   Albina GORMAN Dine, MD  pramipexole  (MIRAPEX ) 0.25 MG tablet TAKE 1 TABLET BY MOUTH 3 TIMES DAILY 10/31/24   Albina GORMAN Dine, MD  pyridOXINE (VITAMIN B6) 100 MG tablet TAKE 1 TABLET BY MOUTH EVERY DAY *NEW PRESCRIPTION REQUEST* 06/22/24   Albina GORMAN Dine, MD    Physical Exam: Vitals:   12/23/24 1018 12/23/24 1022  BP:  121/67  Pulse:  69  Resp:  20  Temp:  98 F (36.7 C)  SpO2:  93%  Weight: 68.5 kg   Height: 5' 5 (1.651 m)     Constitutional: NAD, calm, comfortable Vitals:   12/23/24 1018 12/23/24 1022  BP:  121/67  Pulse:  69  Resp:  20  Temp:  98 F (36.7 C)  SpO2:  93%  Weight: 68.5 kg   Height: 5' 5 (1.651 m)    Eyes: PERRL, lids and conjunctivae normal ENMT: Mucous membranes are moist. Posterior pharynx clear of any exudate or lesions.Normal dentition.  Neck: normal, supple, no masses, no thyromegaly Respiratory: Diminished breath sound bilaterally, diffused wheezing, scattered crackles, increasing respiratory effort. No accessory muscle use.  Cardiovascular: Regular rate and rhythm, no murmurs / rubs / gallops.  Trace extremity edema. 2+ pedal pulses. No carotid bruits.  Abdomen: no tenderness, no masses palpated. No hepatosplenomegaly. Bowel sounds positive.  Musculoskeletal: no clubbing / cyanosis. No joint deformity upper and lower extremities. Good ROM, no contractures. Normal muscle tone.  Skin: no rashes,  lesions, ulcers. No induration Neurologic: CN 2-12 grossly intact. Sensation intact, DTR normal. Strength 5/5 in all 4.  Psychiatric: Normal judgment and insight. Alert and oriented x 3. Normal mood.    Labs on Admission: I have personally reviewed following labs and imaging studies  CBC: Recent Labs  Lab 12/23/24 1024  WBC 10.7*  HGB 11.5*  HCT 34.5*  MCV 93.2  PLT 266   Basic Metabolic Panel: Recent Labs  Lab 12/23/24 1024  NA 134*  K 3.8  CL 99  CO2 28  GLUCOSE 162*  BUN 13  CREATININE 0.83  CALCIUM  9.8  MG 1.8   GFR: Estimated Creatinine Clearance: 64 mL/min (by C-G formula based on SCr of 0.83 mg/dL). Liver Function Tests: No results for input(s): AST, ALT, ALKPHOS, BILITOT, PROT, ALBUMIN in the last 168 hours. No results for input(s): LIPASE, AMYLASE in the last 168 hours. No results for input(s): AMMONIA in the last 168 hours. Coagulation Profile: No results for input(s): INR, PROTIME in the last 168 hours. Cardiac Enzymes: No results for input(s): CKTOTAL, CKMB, CKMBINDEX, TROPONINI in the last 168 hours. BNP (last 3 results) Recent Labs    11/16/24 0904 12/23/24 1024  PROBNP 1,132.0* 1,435.0*   HbA1C: No results for input(s): HGBA1C in the last 72 hours. CBG: No results for input(s): GLUCAP in the last 168 hours. Lipid Profile: No results for input(s): CHOL, HDL, LDLCALC, TRIG, CHOLHDL, LDLDIRECT in the last 72 hours. Thyroid  Function Tests: No results for input(s): TSH, T4TOTAL, FREET4, T3FREE, THYROIDAB in the last 72 hours. Anemia Panel: No results for input(s): VITAMINB12, FOLATE, FERRITIN, TIBC, IRON, RETICCTPCT in the last 72 hours. Urine analysis:    Component Value Date/Time   COLORURINE STRAW (A) 08/03/2024 1533   APPEARANCEUR CLEAR (A) 08/03/2024 1533   APPEARANCEUR Cloudy (A) 10/17/2023 1440   LABSPEC 1.005 08/03/2024 1533   PHURINE 8.0 08/03/2024 1533   GLUCOSEU  NEGATIVE 08/03/2024 1533   HGBUR NEGATIVE 08/03/2024 1533   BILIRUBINUR negative 08/15/2024 1336   BILIRUBINUR Negative 10/17/2023 1440   KETONESUR NEGATIVE 08/03/2024 1533   PROTEINUR Negative 08/15/2024 1336   PROTEINUR NEGATIVE 08/03/2024 1533   UROBILINOGEN 0.2 08/15/2024 1336   UROBILINOGEN 0.2 04/14/2014 1217   NITRITE negative 08/15/2024 1336   NITRITE NEGATIVE 08/03/2024 1533   LEUKOCYTESUR Negative 08/15/2024 1336   LEUKOCYTESUR LARGE (A) 08/03/2024 1533    Radiological Exams on Admission: DG Chest 2 View Result Date: 12/23/2024 CLINICAL DATA:  Worsening shortness of breath and congestion. EXAM: CHEST - 2 VIEW COMPARISON:  None Available. FINDINGS: The heart size and mediastinal contours are within normal limits. Stable prominence of the hilar pulmonary vessels is seen. There is moderate to marked severity calcification of the aortic arch. No acute infiltrate, pleural effusion or pneumothorax is identified. Postoperative changes seen within the lower cervical spine and lumbar spine, with multilevel degenerative changes present throughout the thoracic spine. IMPRESSION: No active cardiopulmonary disease. Electronically Signed   By: Suzen Dials M.D.   On: 12/23/2024 11:05    EKG: Independently reviewed.  Sinus rhythm, no acute ST changes.  Assessment/Plan Principal Problem:   COPD exacerbation (HCC)  (please populate well all problems here in Problem List. (For example, if patient is on BP meds at home and you resume or decide to hold them, it is a problem that needs to be her. Same for CAD, COPD, HLD and so on)  Acute COPD exacerbation Acute on chronic hypoxic respiratory failure - Continue antibiotics of Augmentin  - IV Solu-Medrol  - Continue ICS and LABA - DuoNebs and as needed albuterol  - Incentive spirometry and flutter valve - Ambulatory pulse ox tomorrow, likely can go home once breathing symptom stabilized.  Acute on chronic HFpEF decompensation HTN - 1 dose  of IV Lasix , reevaluate volume status tomorrow.  Slight increase of peripheral edema but no overt fluid overload signs and no acute infiltrates  on chest x-ray.SABRA  Her symptoms can be explained by COPD respiration otherwise. - Continue Cardizem , clonidine  and minoxidil  and ARB  Cigarette smoking - Cessation education performed at bedside  Anxiety/depression - Mentation at baseline, continue SSRI  Hypothyroidism - Continue Synthroid   DVT prophylaxis: Lovenox  Code Status: Full code Family Communication: None at bedside Disposition Plan: Expect less than 2 midnight hospital stay Consults called: None Admission status: MedSurg observation   Cort ONEIDA Mana MD [facility] Pager [phone]  12/23/2024, 11:54 AM       [1]  Allergies Allergen Reactions   Nsaids Other (See Comments)    Pt states it messes up her kidneys But takes aspirin  & ibuprofen    Thiazide-Type Diuretics Other (See Comments)    Other reaction(s): Due to CKD   "

## 2024-12-23 NOTE — ED Triage Notes (Addendum)
 Pt comes via [facility] from home with c/o sob. Pt states progressively getting worse. Pt states both knee pain and fluid around them. Pt did have fall two weeks ago. Pt state she just lost her balance. Pt denies any loc. Pt also wears 3L [place] PRN but has been wearing it 24.7. EMS reports O2 was 88% on RA. Pt has chf but not on fluid pill.

## 2024-12-23 NOTE — ED Notes (Signed)
 See triage note  Presents with knee /leg pain  States she developed pain w/o injury Also has been SOB  Does wear O2 at night and prn   But thinks she is getting worse   Afebrile on arrival

## 2024-12-24 ENCOUNTER — Ambulatory Visit: Admission: RE | Admit: 2024-12-24 | Source: Ambulatory Visit

## 2024-12-24 LAB — RESPIRATORY PANEL BY PCR

## 2024-12-26 ENCOUNTER — Ambulatory Visit (HOSPITAL_BASED_OUTPATIENT_CLINIC_OR_DEPARTMENT_OTHER): Admitting: Pulmonary Disease

## 2024-12-26 NOTE — Progress Notes (Unsigned)
 "  New Patient Pulmonology Office Visit   Subjective:  Patient ID: [name], female    DOB: [birth year]  MRN: [id number]  Referred by: Stephanie Charlene CROME, MD  CC: No chief complaint on file.   HPI [name] is a 69 y.o. female with ***  {PULM QUESTIONNAIRES (Optional):33196}  ROS  Allergies: Nsaids and Thiazide-type diuretics Current Medications[1] Past Medical History:  Diagnosis Date   (HFpEF) heart failure with preserved ejection fraction (HCC)    Anxiety    Aortic atherosclerosis    Arthritis    Bilateral hand numbness    CAD (coronary artery disease)    Cervical myelopathy (HCC)    Cervical spinal stenosis    Chest pain at rest    CHF (congestive heart failure) (HCC)    Chronic pain syndrome    Chronic respiratory failure with hypoxia and hypercapnia (HCC)    CKD stage 3b, GFR 30-44 ml/min (HCC)    Community acquired pneumonia 01/10/2013   COPD (chronic obstructive pulmonary disease) (HCC)    DDD (degenerative disc disease), thoracic    Dependence on supplemental oxygen  (3L/[place] qhs PRN)    Depression    DM (diabetes mellitus), type 2 (HCC)    Dyspnea    Failed back surgical syndrome    GERD (gastroesophageal reflux disease)    Hyperlipidemia    Hypertension    Hypothyroid    Lumbar spondylosis    a.) s/p lumbar laminectomy, microdiscectomy, and L4-L5 fusion   Neuropathy    Nicotine  dependence    Normocytic anemia    OSA on CPAP    Pancreatitis 03/2024   Peripheral vascular disease    Pulmonary hypertension (HCC)    Recurrent UTI (urinary tract infection)    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  no seizure since--pt does take keppra  and office note from neurologist dr. margaret on this  chart   Sepsis due to pneumonia [facility])    Sinus tachycardia    Tobacco abuse    Urinary incontinence    Wound of left leg    being seen at wound clinic as of 03-23-24   Past Surgical History:  Procedure Laterality Date   ANTERIOR CERVICAL DECOMP/DISCECTOMY FUSION N/A 08/17/2024   Procedure: ANTERIOR CERVICAL DECOMPRESSION/DISCECTOMY FUSION 2 LEVELS;  Surgeon: Clois Fret, MD;  Location: [facility] ORS;  Service: Neurosurgery;  Laterality: N/A;  C3-4 & C6-7 ANTERIOR CERVICAL DISCECTOMY AND FUSION   CARPAL TUNNEL RELEASE Left    COLONOSCOPY WITH PROPOFOL  N/A 06/17/2022   Procedure: COLONOSCOPY WITH PROPOFOL ;  Surgeon: Maryruth Ole DASEN, MD;  Location: [facility];  Service: Endoscopy;  Laterality: N/A;   JOINT REPLACEMENT  08/26/2011   right total knee arthroplasty, left total knee   KNEE ARTHROPLASTY  10/28/2011   Procedure: COMPUTER ASSISTED TOTAL KNEE ARTHROPLASTY;  Surgeon: Norleen CROME Rendall III;  Location: [facility];  Service: Orthopedics;  Laterality: Left;  preop femoral nerve block   LAMINECTOMY AND MICRODISCECTOMY LUMBAR SPINE N/A    Procedure: LUMBAR LAMINECTOMY, MICRODISCECTOMY, AND L4-L5 FUSION   OPEN REDUCTION INTERNAL FIXATION (ORIF) METACARPAL Left 01/30/2024   Procedure: OPEN REDUCTION INTERNAL FIXATION (ORIF) OF LEFT 5TH METACARPAL SHAFT FRACTURE.;  Surgeon: Edie Norleen PARAS,  MD;  Location: [facility];  Service: Orthopedics;  Laterality: Left;   PROCTOSCOPY N/A 06/18/2022   Procedure: RIGID PROCTOSCOPY;  Surgeon: Sheldon Standing, MD;  Location: [facility];  Service: General;  Laterality: N/A;   Family History  Problem Relation Age of Onset   Heart attack Father 74   Alzheimer's disease Mother    Breast cancer Neg Hx    Social History   Socioeconomic History   Marital status: Widowed    Spouse name: Not on file   Number of children: Not on file   Years of education: Not on file   Highest education level: Not on file  Occupational History   Not on file  Tobacco Use    Smoking status: Every Day    Current packs/day: 2.00    Average packs/day: 2.0 packs/day for 50.0 years (100.0 ttl pk-yrs)    Types: Cigarettes    Passive exposure: Current   Smokeless tobacco: Never  Vaping Use   Vaping status: Every Day   Substances: Nicotine , Flavoring  Substance and Sexual Activity   Alcohol  use: Yes    Comment: rare   Drug use: No   Sexual activity: Yes    Birth control/protection: Post-menopausal  Other Topics Concern   Not on file  Social History Narrative   ** Merged History Encounter **       Social Drivers of Health   Tobacco Use: High Risk (12/23/2024)   Patient History    Smoking Tobacco Use: Every Day    Smokeless Tobacco Use: Never    Passive Exposure: Current  Financial Resource Strain: Low Risk  (03/09/2024)   Received from [facility]   Overall Financial Resource Strain (CARDIA)    Difficulty of Paying Living Expenses: Not hard at all  Food Insecurity: No Food Insecurity (11/16/2024)   Epic    Worried About Radiation Protection Practitioner of Food in the Last Year: Never true    Ran Out of Food in the Last Year: Never true  Transportation Needs: No Transportation Needs (11/16/2024)   Epic    Lack of Transportation (Medical): No    Lack of Transportation (Non-Medical): No  Physical Activity: Not on file  Stress: Not on file  Social Connections: Socially Isolated (11/16/2024)   Social Connection and Isolation Panel    Frequency of Communication with Friends and Family: More than three times a week    Frequency of Social Gatherings with Friends and Family: Once a week    Attends Religious Services: Never    Database Administrator or Organizations: No    Attends Banker Meetings: Never    Marital Status: Widowed  Intimate Partner Violence: Not At Risk (11/16/2024)   Epic    Fear of Current or Ex-Partner: No    Emotionally Abused: No    Physically Abused: No    Sexually Abused: No  Depression (PHQ2-9): High Risk (07/12/2024)    Depression (PHQ2-9)    PHQ-2 Score: 12  Alcohol  Screen: Low Risk (12/22/2022)   Alcohol  Screen    Last Alcohol  Screening Score (AUDIT): 1  Housing: Low Risk (11/16/2024)   Epic    Unable to Pay for Housing in the Last Year: No    Number of Times Moved in the Last Year: 0    Homeless in the Last Year: No  Utilities: Not At Risk (11/16/2024)   Epic    Threatened with loss of utilities: No  Health Literacy: Not on file  Objective:  There were no vitals taken for this visit. {Pulm Vitals (Optional):32837}  Physical Exam  Diagnostic Review:  {Labs (Optional):32838}     Assessment & Plan:   Assessment & Plan   No orders of the defined types were placed in this encounter.     No follow-ups on file.   Gayle Collard, MD    [1]  Current Outpatient Medications:    acetaminophen  (TYLENOL ) 650 MG CR tablet, Take 2 tablets (1,300 mg total) by mouth every 8 (eight) hours as needed for pain. Home med., Disp: , Rfl:    albuterol  (VENTOLIN  HFA) 108 (90 Base) MCG/ACT inhaler, Inhale 2 puffs into the lungs every 4 (four) hours as needed for wheezing or shortness of breath., Disp: 18 g, Rfl: 11   amoxicillin -clavulanate (AUGMENTIN ) 500-125 MG tablet, Take 1 tablet by mouth 2 (two) times daily. for 7 days, Disp: , Rfl:    budesonide -glycopyrrolate -formoterol  (BREZTRI ) 160-9-4.8 MCG/ACT AERO inhaler, Inhale 2 puffs into the lungs in the morning and at bedtime., Disp: , Rfl:    busPIRone  (BUSPAR ) 30 MG tablet, Take 1 tablet (30 mg total) by mouth 2 (two) times daily., Disp: 180 tablet, Rfl: 1   Cholecalciferol  (VITAMIN D -3) 25 MCG (1000 UT) CAPS, Take 1,000 Units by mouth daily., Disp: , Rfl:    cloNIDine  (CATAPRES ) 0.3 MG tablet, Take 1 tablet (0.3 mg total) by mouth 2 (two) times daily., Disp: 60 tablet, Rfl: 0   cyanocobalamin  (VITAMIN B12) 1000 MCG tablet, TAKE 1 TABLET BY MOUTH EVERY DAY *NEW PRESCRIPTION REQUEST*, Disp: 90 tablet, Rfl: 3   diltiazem  (CARDIZEM  CD) 180 MG 24 hr  capsule, Take 180 mg by mouth every morning., Disp: , Rfl:    escitalopram  (LEXAPRO ) 20 MG tablet, TAKE 1 TABLET (20 MG TOTAL) BY MOUTH EVERY MORNING., Disp: 90 tablet, Rfl: 1   fenofibrate  micronized (LOFIBRA) 134 MG capsule, TAKE 1 CAPSULE BY MOUTH EVERY DAY BEFORE BREAKFAST, Disp: 90 capsule, Rfl: 1   fluticasone  (FLONASE ) 50 MCG/ACT nasal spray, Place 1 spray into both nostrils daily as needed for allergies., Disp: , Rfl:    gabapentin  (NEURONTIN ) 800 MG tablet, TAKE 1 TABLET BY MOUTH THREE TIMES DAILY, Disp: 270 tablet, Rfl: 1   levothyroxine  (SYNTHROID ) 25 MCG tablet, TAKE 1 TABLET BY MOUTH ONCE DAILY, Disp: 90 tablet, Rfl: 1   lidocaine -prilocaine (EMLA) cream, Apply 1 Application topically 4 (four) times daily as needed (pain)., Disp: , Rfl:    methocarbamol  (ROBAXIN ) 500 MG tablet, Take 1 tablet (500 mg total) by mouth every 6 (six) hours as needed for muscle spasms., Disp: 120 tablet, Rfl: 0   minoxidil  (LONITEN ) 10 MG tablet, TAKE 1 TABLET BY MOUTH EVERY DAY, Disp: 90 tablet, Rfl: 1   naphazoline-glycerin  (CLEAR EYES REDNESS) 0.012-0.25 % SOLN, Place 1-2 drops into both eyes 4 (four) times daily as needed for eye irritation., Disp: , Rfl:    olmesartan  (BENICAR ) 40 MG tablet, TAKE 1 TABLET BY MOUTH EVERY DAY, Disp: 90 tablet, Rfl: 1   ondansetron  (ZOFRAN -ODT) 4 MG disintegrating tablet, Take 1 tablet (4 mg total) by mouth every 6 (six) hours., Disp: 30 tablet, Rfl: 0   OXYGEN , Inhale 3 L into the lungs See admin instructions. Inhale 3L of oxygen  throughout the day as needed and at night, Disp: , Rfl:    pantoprazole  (PROTONIX ) 40 MG tablet, TAKE 1 TABLET BY MOUTH EVERY DAY IN THE MORNING, Disp: 90 tablet, Rfl: 1   pramipexole  (MIRAPEX ) 0.25 MG tablet, TAKE 1 TABLET BY MOUTH  3 TIMES DAILY, Disp: 270 tablet, Rfl: 1   predniSONE  (DELTASONE ) 10 MG tablet, Take 4 tablets (40 mg total) by mouth daily., Disp: 20 tablet, Rfl: 0   pyridOXINE (VITAMIN B6) 100 MG tablet, TAKE 1 TABLET BY MOUTH EVERY  DAY *NEW PRESCRIPTION REQUEST*, Disp: 90 tablet, Rfl: 3 No current facility-administered medications for this visit.  Facility-Administered Medications Ordered in Other Visits:    acetaminophen  (TYLENOL ) tablet 1,000 mg, 1,000 mg, Oral, Once, Vicci, Camellia Glatter, MD   chlorhexidine  (HIBICLENS ) 4 % liquid 4 application, 60 mL, Topical, Once, Duffy, Wonda, PA-C   chlorhexidine  (HIBICLENS ) 4 % liquid 4 application, 60 mL, Topical, Once, Duffy, Wonda, PA-C   chlorhexidine  (PERIDEX ) 0.12 % solution 15 mL, 15 mL, Mouth/Throat, Once **OR** Oral care mouth rinse, 15 mL, Mouth Rinse, Once, Piscitello, Fairy POUR, MD  "

## 2024-12-28 ENCOUNTER — Other Ambulatory Visit: Payer: Self-pay | Admitting: Internal Medicine

## 2024-12-28 ENCOUNTER — Ambulatory Visit: Admission: RE | Admit: 2024-12-28

## 2024-12-28 DIAGNOSIS — F419 Anxiety disorder, unspecified: Secondary | ICD-10-CM

## 2025-01-03 NOTE — Progress Notes (Signed)
 Patient presented with acute bronchitis symptoms, respiratory panel helped for clinical decision whether patient should continue antibiotic treatment.

## 2025-01-08 ENCOUNTER — Other Ambulatory Visit: Payer: Self-pay | Admitting: Internal Medicine

## 2025-01-08 DIAGNOSIS — I10 Essential (primary) hypertension: Secondary | ICD-10-CM

## 2025-01-16 ENCOUNTER — Ambulatory Visit: Admitting: Internal Medicine

## 2025-01-16 NOTE — Progress Notes (Unsigned)
" °  Cardiology Office Note:  .   Date:  01/16/2025  ID:  [name], DOB 1956-09-01, MRN [id number] PCP: Stephanie Charlene CROME, MD  [facility] Providers Cardiologist:  None { Click to update primary MD,subspecialty MD or APP then REFRESH:1}    History of Present Illness: .   [name] is a 69 y.o. 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 Flinchum.  She has been followed by [facility] clinic cardiology for several years, having last been seen by Dr. Florencio in 10/2024.  She was seen in the ED last month due to concerns for shortness of breath, leg pain, and increasing fatigue.  It was felt that her symptoms were due to a combination of COPD exacerbation and acute on chronic HFpEF.  ROS: See HPI  Studies Reviewed: SABRA        Coronary CTA (10/12/2023): Mild-moderate, nonobstructive coronary artery disease with less than 25% stenosis involving the proximal LAD and proximal LCx as well as 25-49% stenosis involving the proximal RCA.  Coronary calcium  score 726 (97th percentile for age and sex matched controls).  TTE (11/18/2020): Normal LV size with borderline LVH.  LVEF 60-65% with normal wall motion.  Grade 2 diastolic dysfunction with elevated filling pressure.  Normal RV size and function with mild pulmonary hypertension (RVSP 38 mmHg).  Severe biatrial enlargement.  No pericardial effusion.  Mild MR and TR.  Aortic sclerosis without stenosis.  CVP ~15 mmHg.  Risk Assessment/Calculations:   {Does this patient have ATRIAL FIBRILLATION?:[phone]} No BP recorded.  {Refresh Note OR Click here to enter BP  :1}***       Physical Exam:   VS:  There were no vitals taken for this visit.   Wt Readings from Last 3 Encounters:  12/23/24 151  lb (68.5 kg)  12/20/24 149 lb 2 oz (67.6 kg)  11/16/24 167 lb 8.8 oz (76 kg)    General:  NAD. Neck: No JVD or HJR. Lungs: Clear to auscultation bilaterally without wheezes or crackles. Heart: Regular rate and rhythm without murmurs, rubs, or gallops. Abdomen: Soft, nontender, nondistended. Extremities: No lower extremity edema.  ASSESSMENT AND PLAN: .    ***    {Are you ordering a CV Procedure (e.g. stress test, cath, DCCV, TEE, etc)?   Press F2        :[id number]}  Dispo: ***  Signed, Lonni Hanson, MD  "

## 2025-01-17 ENCOUNTER — Encounter

## 2025-01-21 ENCOUNTER — Encounter

## 2025-01-22 ENCOUNTER — Ambulatory Visit

## 2025-05-14 ENCOUNTER — Ambulatory Visit: Admitting: Neurosurgery
